# Patient Record
Sex: Male | Born: 1945 | Race: White | Hispanic: No | Marital: Married | Smoking: Never smoker
Health system: Southern US, Community
[De-identification: ages and names within clinical notes are randomized; demographics above are authoritative.]

## PROBLEM LIST (undated history)

## (undated) DIAGNOSIS — M79605 Pain in left leg: Secondary | ICD-10-CM

## (undated) DIAGNOSIS — D751 Secondary polycythemia: Secondary | ICD-10-CM

## (undated) DIAGNOSIS — C801 Malignant (primary) neoplasm, unspecified: Secondary | ICD-10-CM

## (undated) DIAGNOSIS — H9190 Unspecified hearing loss, unspecified ear: Secondary | ICD-10-CM

## (undated) DIAGNOSIS — N189 Chronic kidney disease, unspecified: Secondary | ICD-10-CM

## (undated) DIAGNOSIS — I82409 Acute embolism and thrombosis of unspecified deep veins of unspecified lower extremity: Secondary | ICD-10-CM

## (undated) DIAGNOSIS — N501 Vascular disorders of male genital organs: Secondary | ICD-10-CM

## (undated) DIAGNOSIS — M1990 Unspecified osteoarthritis, unspecified site: Secondary | ICD-10-CM

## (undated) DIAGNOSIS — M79604 Pain in right leg: Secondary | ICD-10-CM

## (undated) DIAGNOSIS — D689 Coagulation defect, unspecified: Secondary | ICD-10-CM

## (undated) DIAGNOSIS — Z5189 Encounter for other specified aftercare: Secondary | ICD-10-CM

## (undated) DIAGNOSIS — F0390 Unspecified dementia without behavioral disturbance: Secondary | ICD-10-CM

## (undated) DIAGNOSIS — E785 Hyperlipidemia, unspecified: Secondary | ICD-10-CM

## (undated) DIAGNOSIS — D090 Carcinoma in situ of bladder: Secondary | ICD-10-CM

## (undated) DIAGNOSIS — Z9889 Other specified postprocedural states: Secondary | ICD-10-CM

## (undated) DIAGNOSIS — K219 Gastro-esophageal reflux disease without esophagitis: Secondary | ICD-10-CM

## (undated) DIAGNOSIS — I739 Peripheral vascular disease, unspecified: Secondary | ICD-10-CM

## (undated) DIAGNOSIS — R7303 Prediabetes: Secondary | ICD-10-CM

## (undated) DIAGNOSIS — I10 Essential (primary) hypertension: Secondary | ICD-10-CM

## (undated) DIAGNOSIS — Z85828 Personal history of other malignant neoplasm of skin: Secondary | ICD-10-CM

## (undated) DIAGNOSIS — IMO0001 Reserved for inherently not codable concepts without codable children: Secondary | ICD-10-CM

## (undated) DIAGNOSIS — Z87820 Personal history of traumatic brain injury: Secondary | ICD-10-CM

## (undated) DIAGNOSIS — D649 Anemia, unspecified: Secondary | ICD-10-CM

## (undated) HISTORY — DX: Acute embolism and thrombosis of unspecified deep veins of unspecified lower extremity: I82.409

## (undated) HISTORY — DX: Coagulation defect, unspecified: D68.9

## (undated) HISTORY — DX: Encounter for other specified aftercare: Z51.89

## (undated) HISTORY — PX: TONSILLECTOMY: SUR1361

---

## 1990-01-02 DIAGNOSIS — Z87820 Personal history of traumatic brain injury: Secondary | ICD-10-CM

## 1990-01-02 HISTORY — DX: Personal history of traumatic brain injury: Z87.820

## 1997-09-30 ENCOUNTER — Ambulatory Visit (HOSPITAL_COMMUNITY): Admission: RE | Admit: 1997-09-30 | Discharge: 1997-09-30 | Payer: Self-pay | Admitting: Cardiology

## 2000-11-04 ENCOUNTER — Emergency Department (HOSPITAL_COMMUNITY): Admission: EM | Admit: 2000-11-04 | Discharge: 2000-11-04 | Payer: Self-pay | Admitting: Emergency Medicine

## 2000-11-04 ENCOUNTER — Encounter: Payer: Self-pay | Admitting: Emergency Medicine

## 2004-03-10 ENCOUNTER — Encounter: Payer: Self-pay | Admitting: Gastroenterology

## 2004-03-11 ENCOUNTER — Encounter: Payer: Self-pay | Admitting: Gastroenterology

## 2004-03-11 ENCOUNTER — Ambulatory Visit (HOSPITAL_COMMUNITY): Admission: RE | Admit: 2004-03-11 | Discharge: 2004-03-11 | Payer: Self-pay | Admitting: *Deleted

## 2007-01-03 HISTORY — PX: KNEE ARTHROSCOPY: SUR90

## 2007-01-03 HISTORY — PX: TOTAL KNEE ARTHROPLASTY: SHX125

## 2007-05-02 ENCOUNTER — Encounter: Admission: RE | Admit: 2007-05-02 | Discharge: 2007-05-02 | Payer: Self-pay | Admitting: Family Medicine

## 2007-07-29 ENCOUNTER — Inpatient Hospital Stay (HOSPITAL_COMMUNITY): Admission: RE | Admit: 2007-07-29 | Discharge: 2007-08-01 | Payer: Self-pay | Admitting: Orthopedic Surgery

## 2007-08-20 ENCOUNTER — Encounter: Payer: Self-pay | Admitting: Gastroenterology

## 2007-08-21 ENCOUNTER — Encounter: Payer: Self-pay | Admitting: Gastroenterology

## 2007-09-12 ENCOUNTER — Encounter: Payer: Self-pay | Admitting: Gastroenterology

## 2007-09-19 ENCOUNTER — Ambulatory Visit: Payer: Self-pay | Admitting: Gastroenterology

## 2007-09-19 DIAGNOSIS — R933 Abnormal findings on diagnostic imaging of other parts of digestive tract: Secondary | ICD-10-CM

## 2007-09-19 DIAGNOSIS — R1319 Other dysphagia: Secondary | ICD-10-CM

## 2007-09-20 ENCOUNTER — Ambulatory Visit: Payer: Self-pay | Admitting: Gastroenterology

## 2007-09-20 ENCOUNTER — Encounter: Payer: Self-pay | Admitting: Gastroenterology

## 2007-09-24 ENCOUNTER — Encounter: Payer: Self-pay | Admitting: Gastroenterology

## 2007-09-30 ENCOUNTER — Ambulatory Visit: Payer: Self-pay | Admitting: Gastroenterology

## 2007-09-30 ENCOUNTER — Ambulatory Visit (HOSPITAL_COMMUNITY): Admission: RE | Admit: 2007-09-30 | Discharge: 2007-09-30 | Payer: Self-pay | Admitting: Gastroenterology

## 2007-10-21 ENCOUNTER — Telehealth: Payer: Self-pay | Admitting: Gastroenterology

## 2007-10-24 ENCOUNTER — Ambulatory Visit: Payer: Self-pay | Admitting: Gastroenterology

## 2007-10-24 DIAGNOSIS — K219 Gastro-esophageal reflux disease without esophagitis: Secondary | ICD-10-CM

## 2009-09-20 ENCOUNTER — Emergency Department (HOSPITAL_COMMUNITY): Admission: EM | Admit: 2009-09-20 | Discharge: 2009-09-20 | Payer: Self-pay | Admitting: Emergency Medicine

## 2010-01-11 ENCOUNTER — Ambulatory Visit
Admission: RE | Admit: 2010-01-11 | Discharge: 2010-01-11 | Payer: Self-pay | Source: Home / Self Care | Attending: Urology | Admitting: Urology

## 2010-01-11 HISTORY — PX: TRANSURETHRAL RESECTION OF BLADDER TUMOR: SHX2575

## 2010-01-17 LAB — CBC
HCT: 52.7 % — ABNORMAL HIGH (ref 39.0–52.0)
Hemoglobin: 17.6 g/dL — ABNORMAL HIGH (ref 13.0–17.0)
MCH: 32.6 pg (ref 26.0–34.0)
MCHC: 33.4 g/dL (ref 30.0–36.0)
MCV: 97.6 fL (ref 78.0–100.0)
Platelets: 190 10*3/uL (ref 150–400)
RBC: 5.4 MIL/uL (ref 4.22–5.81)
RDW: 12.8 % (ref 11.5–15.5)
WBC: 6.2 10*3/uL (ref 4.0–10.5)

## 2010-01-17 LAB — BASIC METABOLIC PANEL
BUN: 11 mg/dL (ref 6–23)
CO2: 29 mEq/L (ref 19–32)
Calcium: 9.2 mg/dL (ref 8.4–10.5)
Chloride: 106 mEq/L (ref 96–112)
Creatinine, Ser: 1.09 mg/dL (ref 0.4–1.5)
GFR calc Af Amer: >60 mL/min (ref >60)
GFR calc non Af Amer: >60 mL/min (ref >60)
Glucose, Bld: 115 mg/dL — ABNORMAL HIGH (ref 70–99)
Potassium: 4.5 mEq/L (ref 3.5–5.1)
Sodium: 142 mEq/L (ref 135–145)

## 2010-01-31 ENCOUNTER — Emergency Department (HOSPITAL_COMMUNITY)
Admission: EM | Admit: 2010-01-31 | Discharge: 2010-02-01 | Payer: Self-pay | Source: Home / Self Care | Admitting: Emergency Medicine

## 2010-01-31 LAB — URINALYSIS, ROUTINE W REFLEX MICROSCOPIC
Bilirubin Urine: NEGATIVE
Ketones, ur: NEGATIVE mg/dL
Nitrite: NEGATIVE
Protein, ur: 30 mg/dL — AB
Specific Gravity, Urine: 1.016 (ref 1.005–1.030)
Urine Glucose, Fasting: 500 mg/dL — AB
Urobilinogen, UA: 0.2 mg/dL (ref 0.0–1.0)
pH: 6 (ref 5.0–8.0)

## 2010-01-31 LAB — URINE MICROSCOPIC-ADD ON

## 2010-02-01 LAB — CBC
HCT: 45.9 % (ref 39.0–52.0)
Hemoglobin: 15.7 g/dL (ref 13.0–17.0)
MCH: 32.3 pg (ref 26.0–34.0)
MCHC: 34.2 g/dL (ref 30.0–36.0)
MCV: 94.4 fL (ref 78.0–100.0)
Platelets: 239 10*3/uL (ref 150–400)
RBC: 4.86 MIL/uL (ref 4.22–5.81)
RDW: 12.9 % (ref 11.5–15.5)
WBC: 8 10*3/uL (ref 4.0–10.5)

## 2010-02-01 LAB — DIFFERENTIAL
Basophils Absolute: 0 10*3/uL (ref 0.0–0.1)
Basophils Relative: 0 % (ref 0–1)
Eosinophils Absolute: 0.2 10*3/uL (ref 0.0–0.7)
Eosinophils Relative: 3 % (ref 0–5)
Lymphocytes Relative: 26 % (ref 12–46)
Lymphs Abs: 2.1 10*3/uL (ref 0.7–4.0)
Monocytes Absolute: 1.1 10*3/uL — ABNORMAL HIGH (ref 0.1–1.0)
Monocytes Relative: 13 % — ABNORMAL HIGH (ref 3–12)
Neutro Abs: 4.6 10*3/uL (ref 1.7–7.7)
Neutrophils Relative %: 58 % (ref 43–77)

## 2010-02-02 LAB — URINE CULTURE
Colony Count: >=100000
Culture  Setup Time: 201201310322

## 2010-03-17 LAB — CBC
HCT: 49.5 % (ref 39.0–52.0)
Hemoglobin: 17.6 g/dL — ABNORMAL HIGH (ref 13.0–17.0)
MCH: 33.8 pg (ref 26.0–34.0)
MCHC: 35.4 g/dL (ref 30.0–36.0)
MCV: 95.5 fL (ref 78.0–100.0)
Platelets: 167 10*3/uL (ref 150–400)
RBC: 5.19 MIL/uL (ref 4.22–5.81)
RDW: 12.4 % (ref 11.5–15.5)
WBC: 9.3 10*3/uL (ref 4.0–10.5)

## 2010-03-17 LAB — HEMOCCULT GUIAC POC 1CARD (OFFICE): Fecal Occult Bld: POSITIVE

## 2010-05-17 NOTE — Consult Note (Signed)
NAME:  Chad Olson, Chad Olson NO.:  [id number]   MEDICAL RECORD NO.:  [id number]          PATIENT TYPE:  AMB   LOCATION:  ENDO                         FACILITY:  [facility]   PHYSICIAN:  Malcolm T. Russella Dar, MD, FACGDATE OF BIRTH:  1945-06-06   DATE OF CONSULTATION:  09/30/2007  DATE OF DISCHARGE:  09/30/2007                                 CONSULTATION   ESOPHAGEAL MAMOMETRY REPORT   INDICATIONS:  65 year old white male with dysphagia.   PROCEDURE:  Esophageal manometry was performed by standard technique.  Patient tolerated the procedure well.   RESULTS:  Upper esophageal sphincter study revealed normal pressures and  normal relaxation.   Esophageal body study revealed normal peristalsis and normal amplitudes.  100% of swallows were transmitted with normal peristalsis.   Lower esophageal sphincter study revealed normal resting pressures and  normal relaxation.   IMPRESSION:  Normal esophageal manometry.   PLAN:  Return office visit with me as scheduled.      Venita Lick. Russella Dar, MD, [facility]  Electronically Signed     MTS/MEDQ  D:  10/03/2007  T:  10/03/2007  Job:  [id number]

## 2010-05-17 NOTE — Op Note (Signed)
NAME:  Chad Olson, Chad Olson NO.:  [id number]   MEDICAL RECORD NO.:  [id number]          PATIENT TYPE:  INP   LOCATION:  4148                         FACILITY:  [facility]   PHYSICIAN:  Feliberto Gottron. Turner Daniels, M.D.   DATE OF BIRTH:  11-08-1945   DATE OF PROCEDURE:  07/29/2007  DATE OF DISCHARGE:                               OPERATIVE REPORT   PREOPERATIVE DIAGNOSIS:  End-stage arthritis of the left knee.   POSTOPERATIVE DIAGNOSIS:  End-stage arthritis of the left knee.   PROCEDURE:  Cemented left total knee arthroplasty using DePuy Sigma RP  components, 4 femur, 4 tibia, 41 patellar button, and a 10-mm Sigma RP  spacer, double batch of DePuy, and HV cement with 1500 mg of Zinacef.   SURGEON:  Feliberto Gottron. Turner Daniels, MD   FIRST ASSISTANT:  Shirl Harris, PA   ANESTHETIC:  General endotracheal.   ESTIMATED BLOOD LOSS:  Minimal.   FLUID REPLACEMENT:  1200 mL of crystalloid.   DRAINS PLACED:  Foley catheter and 2 medium Hemovac.   URINE OUTPUT:  300 mL.   ESTIMATED BLOOD LOSS:  Zero.   TOURNIQUET TIME:  One hour plus 30 minutes.   INDICATIONS FOR PROCEDURE:  A 65 year old gentleman with end-stage  arthritis of his left knee documented by bare bone arthritic changes on  von Rosen's view.  He has had a conservative treatment consisting of  anti-inflammatory medicines, exercises, physical therapy, anti-  inflammatory medicines, cortisone injections, judicious use of  narcotics, and recently had an arthroscopic washout of the knee that  only can provide about 4-5 months of pain relief.  He desires elective  left total knee arthroplasty, has a varus deformity of about 8 degrees,  and 10-degree flexion contracture.  Risks and benefits of surgery  discussed, questions answered.   DESCRIPTION OF PROCEDURE:  The patient was identified by armband and  taken to the block area at [facility] where a left femoral nerve  block was induced.  He was then taken to operating room 10.   The  appropriate anesthetic monitors were attached, and general endotracheal  anesthesia induced with the patient in the supine position.  Foley  catheter was inserted.  Preoperative IV Ancef was given in the block  area.  The tourniquet applied high to the left thigh.  Lateral post and  foot position applied to the table and left lower extremity prepped and  draped in the usual sterile fashion from the ankle to the tourniquet.  The limb was wrapped with an Esmarch bandage.  The tourniquet inflated  to 350 mmHg, and we began the procedure by making the anterior midline  incision starting a handbreadth above the patella going over the patella  1 cm medial and 2 cm distal to the tibial tubercle.  Small bleeders in  the skin and subcutaneous tissue identified and cauterized.  The  transverse retinaculum was reflected medially allowing a medial  parapatellar arthrotomy.  The prepatellar fat pad was resected.  Superficial medial collateral ligament was elevated from anterior to  posterior off the proximal tibia leaving it intact distally.  The  patella was everted.  The knee was then hyperflexed exposing the  arthritic joint surfaces.  He was down to bare bone medially and in the  patellofemoral groove as well.  Using the electrocautery, the anterior  one half of the menisci were resected.  The cruciate ligaments were  resected.  A posteromedial Z-retractor was placed and McCullough  retractor through the notch and a lateral Hohmann.  Using a half-inch  osteotome, the notch was opened up, removing osteophytes, and the tibial  spines were removed.  We then entered the proximal tibia with the DePuy  step drill followed by the intramedullary rod and a 2-degree posterior  slope cutting guide was pinned into place allowing resection of about 7  mm of bone medially and 8-9 mm of bone laterally.  The posterior  structures were protected with the Z-retractor, Archer Asa, and Hohmann  retractors and  the cut was performed with a standard oscillating saw.  We then entered the distal femur 2 mm anterior to the PCL origin  followed by an intramedullary rod and a 5-degree left distal femoral  cutting guide set at 11 mm.  This was pinned into place along the  epicondylar axis and the distal femoral cut accomplished.  We sized for  a #4 left femoral component to the cutting guide and 3 degrees of  external rotation and then placed the chamfer cutting block and the  screw pins and held it in place.  We then performed our anteroposterior  chamfer cuts without difficulty followed by the standard DePuy box cut  for the Sigma RP system.  The patella was then measured at 23 mm,  thought to be sized for 41 mm button.  We set the cutting guide at the  14 and resected the posterior 9 mm of the patella without difficulty,  sized for 41 button and drilled.  The knee was once again hyperflexed.  Retractors were placed around the tibia.  We sized for a 4 tibial  baseplate which was pinned into place followed by the smokestack and a  conical reamer followed by the Delta fin keel punch.  We then hammered  into place a 4 left trial femoral component and drilled the lugs.  A 10-  mm Sigma RP trial spacer was placed at 41 button on the patella.  The  knee was reduced, came to full extension and flexed to 130 degrees  without any impingement.  The trial components were removed.  Remnants  of the menisci posteriorly were resected at this time and then irrigated  out with normal saline solution and all the bony surfaces dried with  suction and sponges.  At the back table, a double batch of DePuy HV  cement was mixed with 1500 mg of Zinacef and then with a monomer and  applied to all bony metallic mating surfaces except for the posterior  condyles of the femur itself.  In order, we hammered into place a 4  tibial baseplate and removed excess cement, a 4 distal femoral component  and removed the excess cement.  We  snapped in the 10-mm Sigma RP spacer,  pressed the 41-mm button into place, and removed excess cement.  The  knee was held in extension with compression along the axis as the cement  cured.  The wound was irrigated out with normal saline solution and  medium Hemovac drains placed deep in the wound.  The parapatellar  arthrotomy was then closed with running #1 Vicryl suture, the  subcutaneous  tissue with 0 and 2-0 undyed Vicryl suture, and the skin  with skin staples.  A dressing of Xeroform, 4 x 4 dressing, sponges,  Webril, and Ace wrap applied.  Tourniquet let down.  The patient  awakened and taken to the recovery room without difficulty.       Feliberto Gottron. Turner Daniels, M.D.  Electronically Signed     FJR/MEDQ  D:  07/29/2007  T:  07/30/2007  Job:  [id number]

## 2010-05-20 NOTE — Discharge Summary (Signed)
NAME:  Chad Olson, Chad Olson NO.:  [id number]   MEDICAL RECORD NO.:  [id number]          PATIENT TYPE:  INP   LOCATION:  4148                         FACILITY:  [facility]   PHYSICIAN:  Feliberto Gottron. Turner Daniels, M.D.   DATE OF BIRTH:  02-13-1945   DATE OF ADMISSION:  07/29/2007  DATE OF DISCHARGE:  08/01/2007                               DISCHARGE SUMMARY   CHIEF COMPLAINT:  Left knee pain.   HISTORY OF PRESENT ILLNESS:  This is a 65 year old gentleman with  complaints of aggravating pain in his left knee despite conservative  treatment with NSAIDs, steroid injections and left knee arthroscopy.  He  has desired a surgical intervention at this time; however, the risks and  benefits of surgery were discussed with the patient.   PAST MEDICAL HISTORY:  Significant for hypertension and high  cholesterol.   PAST SURGICAL HISTORY:  He had a left knee arthroscopy in 2009.   SOCIAL HISTORY:  He is a non-smoker and does not drink alcohol.  He is  married.   FAMILY HISTORY:  Noncontributory.   ALLERGIES:  He has no known drug allergies.   CURRENT MEDICATIONS:  1. Pravastatin 40 mg one p.o. daily.  2. Lisinopril 20 mg one p.o. daily.  3. Vicodin 5/500 mg tablet one p.o. b.i.d. p.r.n. for pain.  4. Aspirin 81 mg one p.o. daily.  5. Fish oil 1000 mg p.o. daily.  6. Aleve 220 mg tablet one p.o. daily p.r.n.   PHYSICAL EXAMINATION:  Gross examination of the left knee demonstrates  the patient's range of motion to be 0-130 degrees.  He had tenderness to  palpation along the lateral joint lines and 1+ effusion.  He is  neurovascularly intact.  X-rays demonstrate bone-on-bone degenerative  joint disease in the left knee.   PREOPERATIVE LABS:  White blood cells 6.5, red blood cells 5.45,  hemoglobin 17.6, hematocrit 52.2, platelets 201, sodium 135, potassium  3.9, chloride 103, glucose 152, BUN 12, creatinine 0.95, PT 13.2, INR  1.0, and PTT 30.  His urinalysis demonstrates small  leukocytes and  calcium oxalate crystals but is otherwise within normal limits.   HOSPITAL COURSE:  Mr. Maiello was admitted to [facility] on July 29, 2007, when he underwent a total knee arthroplasty performed by Dr. Gean Birchwood using a DePuy system.  Perioperative Foley catheter was placed.  The patient tolerated this procedure well and was transferred to the  orthopedic floor.  On the first postoperative day, he complains of  nausea and vomiting.  His hemoglobin was 14.  His surgical drains were  removed and his Foley catheter was taken out.  He was able to ambulate  50 feet with physical therapy.  On the second postoperative day, the  patient reported improvement in his nausea and vomiting, and was  tolerating p.o. intake fairly well.  His surgical dressings were  changed.  His hemoglobin was 13.  He was able to ambulate 120 feet with  physical therapy.  On the third postoperative day, the patient was  eating well and ambulating independently.  He had 5 stairs  with physical  therapy and was discharged to home.   DISPOSITION:  The patient was discharged home on August 01, 2007, [facility]  managed his physical therapy, Coumadin, and wound care.  His discharge  medicines were as per the as per the HMR with the addition of Percocet 5  mg 1-2 tablets p.o. q.4 h. p.r.n. pain and Coumadin 5 mg tablet to be  taken as directed with a target INR of 1.522.  He was weightbearing as  tolerated and return to the clinic in 1 week.   FINAL DIAGNOSIS:  End-stage degenerative joint disease of the left knee.      Shirl Harris, PA      Feliberto Gottron. Turner Daniels, M.D.  Electronically Signed    JW/MEDQ  D:  08/28/2007  T:  08/29/2007  Job:  [id number]

## 2010-08-24 ENCOUNTER — Encounter: Payer: BC Managed Care – PPO | Attending: Family Medicine | Admitting: *Deleted

## 2010-08-24 ENCOUNTER — Encounter: Payer: Self-pay | Admitting: *Deleted

## 2010-08-24 ENCOUNTER — Ambulatory Visit: Payer: Self-pay

## 2010-08-24 DIAGNOSIS — E119 Type 2 diabetes mellitus without complications: Secondary | ICD-10-CM | POA: Insufficient documentation

## 2010-08-24 DIAGNOSIS — Z713 Dietary counseling and surveillance: Secondary | ICD-10-CM | POA: Insufficient documentation

## 2010-08-24 NOTE — Patient Instructions (Signed)
Patient will attend Core Diabetes Courses as scheduled or follow up prn.  

## 2010-08-24 NOTE — Progress Notes (Signed)
  Patient was seen on 08/24/2010 for the first of a series of three diabetes self-management courses at the [facility]. The following learning objectives were met by the patient during this course:   Defines diabetes and the role of insulin  Identifies type of diabetes and pathophysiology  States normal BG range and personal goals  Identifies three risk factors for the development of diabetes  States the need for and frequency of healthcare follow up (ADA Standards of Care)  No results found for this basename: HGBA1C   Most recent A1c per referring MD = 6.0% (07/26/10)  Patient has established the following initial goals:  Increase exercise  Follow DM meal plan  Take medications appropriately  Keep doctor's appointments  Lose weight  Get medical tests done regularly  Follow-Up Plan: Pt declined additional education at this time. Call for f/u appointment PRN.

## 2010-09-13 ENCOUNTER — Ambulatory Visit: Payer: BC Managed Care – PPO

## 2010-09-20 ENCOUNTER — Ambulatory Visit: Payer: BC Managed Care – PPO

## 2010-09-22 ENCOUNTER — Encounter: Payer: Self-pay | Admitting: Dietician

## 2010-09-30 LAB — CBC
HCT: 38.7 — ABNORMAL LOW
HCT: 39.7
HCT: 52.2 — ABNORMAL HIGH
Hemoglobin: 13.4
Hemoglobin: 14.4
Hemoglobin: 17.6 — ABNORMAL HIGH
MCHC: 33.7
MCV: 95.8
MCV: 96.8
Platelets: 183
Platelets: 201
RBC: 4 — ABNORMAL LOW
RBC: 4.1 — ABNORMAL LOW
RBC: 4.38
RBC: 5.45
RDW: 13.4
RDW: 13.6
WBC: 10.4
WBC: 11.4 — ABNORMAL HIGH
WBC: 11.8 — ABNORMAL HIGH
WBC: 6.5

## 2010-09-30 LAB — APTT: aPTT: 30

## 2010-09-30 LAB — PROTIME-INR
INR: 1
INR: 1.1
INR: 1.7 — ABNORMAL HIGH
Prothrombin Time: 13.2
Prothrombin Time: 14.5

## 2010-09-30 LAB — BASIC METABOLIC PANEL
BUN: 19
CO2: 26
CO2: 26
Calcium: 8.7
Calcium: 9.8
Chloride: 106
Creatinine, Ser: 0.95
Creatinine, Ser: 0.95
GFR calc Af Amer: >60
GFR calc Af Amer: >60
GFR calc non Af Amer: >60
GFR calc non Af Amer: >60
Glucose, Bld: 99
Potassium: 4.7
Sodium: 135
Sodium: 141

## 2010-09-30 LAB — DIFFERENTIAL
Basophils Absolute: 0
Basophils Relative: 1
Eosinophils Absolute: 0.1
Eosinophils Relative: 1
Lymphocytes Relative: 34
Lymphs Abs: 2.2
Monocytes Absolute: 0.7
Monocytes Relative: 11
Neutro Abs: 3.4
Neutrophils Relative %: 53

## 2010-09-30 LAB — URINE MICROSCOPIC-ADD ON

## 2010-09-30 LAB — URINALYSIS, ROUTINE W REFLEX MICROSCOPIC
Bilirubin Urine: NEGATIVE
Glucose, UA: NEGATIVE
Hgb urine dipstick: NEGATIVE
Ketones, ur: NEGATIVE
Nitrite: NEGATIVE
Protein, ur: NEGATIVE
Specific Gravity, Urine: 1.024
Urobilinogen, UA: 1
pH: 5.5

## 2010-09-30 LAB — ABO/RH: ABO/RH(D): A POS

## 2010-09-30 LAB — TYPE AND SCREEN
ABO/RH(D): A POS
Antibody Screen: NEGATIVE

## 2010-12-12 ENCOUNTER — Other Ambulatory Visit: Payer: Self-pay | Admitting: Urology

## 2010-12-15 ENCOUNTER — Other Ambulatory Visit: Payer: Self-pay | Admitting: Surgery

## 2011-01-04 ENCOUNTER — Encounter (HOSPITAL_BASED_OUTPATIENT_CLINIC_OR_DEPARTMENT_OTHER): Payer: Self-pay | Admitting: *Deleted

## 2011-01-04 NOTE — Progress Notes (Signed)
NPO AFTER MN. ARRIVES AT 0615. NEEDS ISTAT AND EKG. WILL TAKE PRAVASTATIN AM OF SURG. W/ SIP OF WATER.

## 2011-01-09 ENCOUNTER — Other Ambulatory Visit: Payer: Self-pay | Admitting: Urology

## 2011-01-09 ENCOUNTER — Encounter (HOSPITAL_BASED_OUTPATIENT_CLINIC_OR_DEPARTMENT_OTHER): Payer: Self-pay | Admitting: Anesthesiology

## 2011-01-09 ENCOUNTER — Ambulatory Visit (HOSPITAL_BASED_OUTPATIENT_CLINIC_OR_DEPARTMENT_OTHER): Payer: BC Managed Care – PPO | Admitting: Anesthesiology

## 2011-01-09 ENCOUNTER — Encounter (HOSPITAL_BASED_OUTPATIENT_CLINIC_OR_DEPARTMENT_OTHER): Payer: Self-pay | Admitting: *Deleted

## 2011-01-09 ENCOUNTER — Ambulatory Visit (HOSPITAL_BASED_OUTPATIENT_CLINIC_OR_DEPARTMENT_OTHER)
Admission: RE | Admit: 2011-01-09 | Discharge: 2011-01-09 | Disposition: A | Discharge status: Home / Self Care | Payer: BC Managed Care – PPO | Source: Ambulatory Visit | Attending: Urology | Admitting: Urology

## 2011-01-09 ENCOUNTER — Encounter (HOSPITAL_BASED_OUTPATIENT_CLINIC_OR_DEPARTMENT_OTHER)
Admission: RE | Disposition: A | Discharge status: Home / Self Care | Payer: Self-pay | Source: Ambulatory Visit | Attending: Urology

## 2011-01-09 ENCOUNTER — Other Ambulatory Visit: Payer: Self-pay

## 2011-01-09 DIAGNOSIS — E785 Hyperlipidemia, unspecified: Secondary | ICD-10-CM | POA: Insufficient documentation

## 2011-01-09 DIAGNOSIS — M129 Arthropathy, unspecified: Secondary | ICD-10-CM | POA: Insufficient documentation

## 2011-01-09 DIAGNOSIS — K219 Gastro-esophageal reflux disease without esophagitis: Secondary | ICD-10-CM | POA: Insufficient documentation

## 2011-01-09 DIAGNOSIS — Z7982 Long term (current) use of aspirin: Secondary | ICD-10-CM | POA: Insufficient documentation

## 2011-01-09 DIAGNOSIS — I10 Essential (primary) hypertension: Secondary | ICD-10-CM | POA: Insufficient documentation

## 2011-01-09 DIAGNOSIS — Z85828 Personal history of other malignant neoplasm of skin: Secondary | ICD-10-CM | POA: Insufficient documentation

## 2011-01-09 DIAGNOSIS — N323 Diverticulum of bladder: Secondary | ICD-10-CM | POA: Insufficient documentation

## 2011-01-09 DIAGNOSIS — E119 Type 2 diabetes mellitus without complications: Secondary | ICD-10-CM | POA: Insufficient documentation

## 2011-01-09 DIAGNOSIS — C679 Malignant neoplasm of bladder, unspecified: Secondary | ICD-10-CM

## 2011-01-09 DIAGNOSIS — Z79899 Other long term (current) drug therapy: Secondary | ICD-10-CM | POA: Insufficient documentation

## 2011-01-09 DIAGNOSIS — D090 Carcinoma in situ of bladder: Secondary | ICD-10-CM | POA: Insufficient documentation

## 2011-01-09 HISTORY — DX: Hyperlipidemia, unspecified: E78.5

## 2011-01-09 HISTORY — PX: CYSTOSCOPY WITH BIOPSY: SHX5122

## 2011-01-09 HISTORY — DX: Unspecified osteoarthritis, unspecified site: M19.90

## 2011-01-09 HISTORY — DX: Personal history of traumatic brain injury: Z87.820

## 2011-01-09 HISTORY — DX: Gastro-esophageal reflux disease without esophagitis: K21.9

## 2011-01-09 HISTORY — DX: Unspecified hearing loss, unspecified ear: H91.90

## 2011-01-09 HISTORY — DX: Reserved for inherently not codable concepts without codable children: IMO0001

## 2011-01-09 HISTORY — DX: Essential (primary) hypertension: I10

## 2011-01-09 LAB — POCT I-STAT 4, (NA,K, GLUC, HGB,HCT)
HCT: 52 % (ref 39.0–52.0)
Sodium: 145 mEq/L (ref 135–145)

## 2011-01-09 SURGERY — CYSTOSCOPY, WITH BIOPSY
Anesthesia: General | Site: Ureter | Wound class: Clean Contaminated

## 2011-01-09 MED ORDER — LIDOCAINE HCL (CARDIAC) 20 MG/ML IV SOLN
INTRAVENOUS | Status: DC | PRN
  Administered 2011-01-09: 100 mg via INTRAVENOUS

## 2011-01-09 MED ORDER — MIDAZOLAM HCL 5 MG/5ML IJ SOLN
INTRAMUSCULAR | Status: DC | PRN
  Administered 2011-01-09: 2 mg via INTRAVENOUS

## 2011-01-09 MED ORDER — PROPOFOL 10 MG/ML IV EMUL
INTRAVENOUS | Status: DC | PRN
  Administered 2011-01-09: 200 mg via INTRAVENOUS

## 2011-01-09 MED ORDER — DEXAMETHASONE SODIUM PHOSPHATE 4 MG/ML IJ SOLN
INTRAMUSCULAR | Status: DC | PRN
  Administered 2011-01-09: 8 mg via INTRAVENOUS

## 2011-01-09 MED ORDER — FENTANYL CITRATE 0.05 MG/ML IJ SOLN
INTRAMUSCULAR | Status: DC | PRN
  Administered 2011-01-09: 25 ug via INTRAVENOUS
  Administered 2011-01-09: 100 ug via INTRAVENOUS
  Administered 2011-01-09: 25 ug via INTRAVENOUS

## 2011-01-09 MED ORDER — CEPHALEXIN 500 MG PO CAPS: 500.0000 mg | ORAL_CAPSULE | Freq: Two times a day (BID) | ORAL | Status: AC

## 2011-01-09 MED ORDER — FENTANYL CITRATE 0.05 MG/ML IJ SOLN: 25.0000 ug | INTRAMUSCULAR | Status: DC | PRN

## 2011-01-09 MED ORDER — BELLADONNA ALKALOIDS-OPIUM 16.2-60 MG RE SUPP
RECTAL | Status: DC | PRN
  Administered 2011-01-09: 1 via RECTAL

## 2011-01-09 MED ORDER — LACTATED RINGERS IV SOLN
INTRAVENOUS | Status: DC
  Administered 2011-01-09: 07:00:00 via INTRAVENOUS

## 2011-01-09 MED ORDER — STERILE WATER FOR IRRIGATION IR SOLN
Status: DC | PRN
  Administered 2011-01-09: 3000 mL

## 2011-01-09 MED ORDER — CEFAZOLIN SODIUM-DEXTROSE 2-3 GM-% IV SOLR
2.0000 g | INTRAVENOUS | Status: AC
  Administered 2011-01-09: 2 g via INTRAVENOUS

## 2011-01-09 MED ORDER — CEFAZOLIN SODIUM 1-5 GM-% IV SOLN: 1.0000 g | INTRAVENOUS | Status: DC

## 2011-01-09 MED ORDER — HYDROCODONE-ACETAMINOPHEN 5-500 MG PO CAPS: 1.0000 | ORAL_CAPSULE | ORAL | Status: AC | PRN

## 2011-01-09 MED ORDER — ONDANSETRON HCL 4 MG/2ML IJ SOLN
INTRAMUSCULAR | Status: DC | PRN
  Administered 2011-01-09: 4 mg via INTRAVENOUS

## 2011-01-09 SURGICAL SUPPLY — 19 items
BAG DRAIN URO-CYSTO SKYTR STRL (DRAIN) ×1 IMPLANT
BAG DRN UROCATH (DRAIN)
CANISTER SUCT LVC 12 LTR MEDI- (MISCELLANEOUS) ×1 IMPLANT
CLOTH BEACON ORANGE TIMEOUT ST (SAFETY) ×2 IMPLANT
DRAPE CAMERA CLOSED 9X96 (DRAPES) ×2 IMPLANT
ELECT REM PT RETURN 9FT ADLT (ELECTROSURGICAL) ×2
ELECTRODE REM PT RTRN 9FT ADLT (ELECTROSURGICAL) ×1 IMPLANT
GLOVE BIO SURGEON STRL SZ8 (GLOVE) ×2 IMPLANT
GLOVE INDICATOR 6.5 STRL GRN (GLOVE) ×2 IMPLANT
GOWN STRL REIN XL XLG (GOWN DISPOSABLE) ×2 IMPLANT
GOWN SURGICAL LARGE (GOWNS) ×1 IMPLANT
GOWN XL W/COTTON TOWEL STD (GOWNS) ×1 IMPLANT
NDL SAFETY ECLIPSE 18X1.5 (NEEDLE) IMPLANT
NEEDLE HYPO 18GX1.5 SHARP (NEEDLE)
NEEDLE HYPO 22GX1.5 SAFETY (NEEDLE) IMPLANT
NS IRRIG 500ML POUR BTL (IV SOLUTION) IMPLANT
PACK CYSTOSCOPY (CUSTOM PROCEDURE TRAY) ×2 IMPLANT
SYR 20CC LL (SYRINGE) IMPLANT
WATER STERILE IRR 3000ML UROMA (IV SOLUTION) ×2 IMPLANT

## 2011-01-09 NOTE — Anesthesia Procedure Notes (Signed)
Procedure Name: LMA Insertion Date/Time: 01/09/2011 7:38 AM Performed by: Renella Cunas D Pre-anesthesia Checklist: Patient identified, Emergency Drugs available, Suction available and Patient being monitored Patient Re-evaluated:Patient Re-evaluated prior to inductionOxygen Delivery Method: Circle System Utilized Preoxygenation: Pre-oxygenation with 100% oxygen Intubation Type: IV induction Ventilation: Mask ventilation without difficulty LMA: LMA inserted LMA Size: 4.0 Number of attempts: 1 Placement Confirmation: positive ETCO2 Tube secured with: Tape Dental Injury: Teeth and Oropharynx as per pre-operative assessment

## 2011-01-09 NOTE — Anesthesia Postprocedure Evaluation (Signed)
  Anesthesia Post-op Note  Patient: Chad Olson  Procedure(s) Performed:  CYSTOSCOPY WITH BIOPSY  Patient Location: PACU  Anesthesia Type: General  Level of Consciousness: awake and alert   Airway and Oxygen Therapy: Patient Spontanous Breathing  Post-op Pain: mild  Post-op Assessment: Post-op Vital signs reviewed, Patient's Cardiovascular Status Stable, Respiratory Function Stable, Patent Airway and No signs of Nausea or vomiting  Post-op Vital Signs: stable  Complications: No apparent anesthesia complications
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
## 2011-01-09 NOTE — Transfer of Care (Signed)
Immediate Anesthesia Transfer of Care Note  Patient: Chad Olson  Procedure(s) Performed:  CYSTOSCOPY WITH BIOPSY  Patient Location: PACU  Anesthesia Type: General  Level of Consciousness: awake, oriented, sedated and patient cooperative  Airway & Oxygen Therapy: Patient Spontanous Breathing and Patient connected to face mask oxygen  Post-op Assessment: Report given to PACU RN and Post -op Vital signs reviewed and stable  Post vital signs: Reviewed and stable  Complications: No apparent anesthesia complications

## 2011-01-09 NOTE — Progress Notes (Signed)
Glasses returned & bilat hearing aids in ears.

## 2011-01-09 NOTE — Anesthesia Preprocedure Evaluation (Addendum)
Anesthesia Evaluation  Patient identified by MRN, date of birth, ID band Patient awake    Reviewed: Allergy & Precautions, H&P , NPO status , Patient's Chart, lab work & pertinent test results  Airway Mallampati: II TM Distance: >3 FB Neck ROM: Full    Dental No notable dental hx.    Pulmonary neg pulmonary ROS,  clear to auscultation  Pulmonary exam normal       Cardiovascular hypertension, neg cardio ROS Regular Normal    Neuro/Psych Negative Neurological ROS  Negative Psych ROS   GI/Hepatic negative GI ROS, Neg liver ROS, GERD-  ,  Endo/Other  Negative Endocrine ROSDiabetes mellitus-Morbid obesity  Renal/GU negative Renal ROS  Genitourinary negative   Musculoskeletal negative musculoskeletal ROS (+)   Abdominal   Peds negative pediatric ROS (+)  Hematology negative hematology ROS (+)   Anesthesia Other Findings   Reproductive/Obstetrics negative OB ROS                          Anesthesia Physical Anesthesia Plan  ASA: III  Anesthesia Plan: General   Post-op Pain Management:    Induction: Intravenous  Airway Management Planned: LMA  Additional Equipment:   Intra-op Plan:   Post-operative Plan:   Informed Consent: I have reviewed the patients History and Physical, chart, labs and discussed the procedure including the risks, benefits and alternatives for the proposed anesthesia with the patient or authorized representative who has indicated his/her understanding and acceptance.   Dental advisory given  Plan Discussed with: CRNA  Anesthesia Plan Comments:        Anesthesia Quick Evaluation

## 2011-01-09 NOTE — Interval H&P Note (Signed)
History and Physical Interval Note:  01/09/2011 7:25 AM  Chad Olson  has presented today for surgery, with the diagnosis of HISTORY OF BLADDER CANCER  The various methods of treatment have been discussed with the patient and family. After consideration of risks, benefits and other options for treatment, the patient has consented to  Procedure(s): CYSTOSCOPY WITH BIOPSY as a surgical intervention .  [name] history has been reviewed, patient examined, no change in status, stable for surgery.  I have reviewed the patients' chart and labs.  Questions were answered to the patient's satisfaction.     Chelsea Aus

## 2011-01-09 NOTE — Op Note (Signed)
Preoperative diagnosis:  History of urothelial carcinoma of the bladder Postoperative diagnosis: Same Procedure: Anesthetic cystoscopy, bladder biopsy Surgeon: Bertram Millard. Brylinn Teaney, M.D.  Anesthesia: Gen.  Indications: This 66 year old male comes in today for anesthetic cystoscopy, bladder biopsy.  He initially presented to Dr. 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 Cystoscopy revealed a bladder tumor.  He underwent anesthetic cystoscopy and TURBT on 01/15/2010. Pathology revealed high-grade urothelial dysplasia/carcinoma in situ. It was thought that he had one small area of microinvasion. There was no evidence of lymphovascular invasion.  He subsequently underwent induction BCG therapy, which was completed on 05/04/2010. His first maintenance BCG was completed on 07/27/2010.  He underwent cystoscopy within the office on 12/09/2010. This revealed 2 areas of abnormal urothelium, one adjacent to the diverticulum, with some erythematous changes within the diverticulum. Additionally, there was an abnormal area within the trigonal area of the bladder.At this point, he presents for cystoscopy and bladder biopsy     Technique and findings: The patient was properly identified in the holding area and received intravenous IV antibiotics. He was taken to the operating room where general anesthetic was administered with the LMA. He was placed in the dorsolithotomy position. Genitalia and perineum were prepped and draped.  Proper time out was then performed.  The procedure then commenced. A 22 French panendoscope was advanced through his urethra. Minimal narrowing of the pendulous urethra was noted. There were no lesions. Prostate was  nonobstructive. Prostatic urethra was normal. The bladder was entered and inspected circumferentially.  Both ureteral orifices were normal in configuration and location. Posterior to the right trigone, there was a 1-1/2 cm diverticulum. No specific urothelial abnormalities were noted within this. Anterior and lateral to the diverticulum, there was an erythematous, slightly nodular area. More than likely, this was consistent with past TURBT site. This was approximately 1 cm in size. There was a slight erythematous and raised area medial to the diverticulum. No other urothelial abnormalities were noted.  The cold cup forceps were used to biopsy both of the aforementioned sites. They were labeled as bladder wall and posterior bladder biopsies, respectively. 2 biopsies were taken from each of the sites, removing the abnormal appearing area. Following biopsy, these areas were cauterized with the Bugbee electrode. No active bleeding was seen. The bladder was drained, and the procedure terminated. A B. and O. suppository was placed.  The patient tolerated the procedure well. He was awakened and taken to the PACU in stable condition.

## 2011-01-10 ENCOUNTER — Encounter (HOSPITAL_BASED_OUTPATIENT_CLINIC_OR_DEPARTMENT_OTHER): Payer: Self-pay | Admitting: Urology

## 2011-07-04 ENCOUNTER — Other Ambulatory Visit: Payer: Self-pay | Admitting: Urology

## 2011-07-04 ENCOUNTER — Encounter (HOSPITAL_BASED_OUTPATIENT_CLINIC_OR_DEPARTMENT_OTHER): Payer: Self-pay | Admitting: *Deleted

## 2011-07-04 NOTE — Progress Notes (Signed)
NPO AFTER MN. ARRIVES AT 0815. NEEDS ISTAT. CURRENT EKG IN EPIC AND CHART. WILL TAKE PRAVASTATIN W/ SIP OF WATER.

## 2011-07-12 ENCOUNTER — Ambulatory Visit (HOSPITAL_BASED_OUTPATIENT_CLINIC_OR_DEPARTMENT_OTHER)
Admission: RE | Admit: 2011-07-12 | Discharge: 2011-07-12 | Disposition: A | Discharge status: Home / Self Care | Payer: BC Managed Care – PPO | Source: Ambulatory Visit | Attending: Urology | Admitting: Urology

## 2011-07-12 ENCOUNTER — Encounter (HOSPITAL_BASED_OUTPATIENT_CLINIC_OR_DEPARTMENT_OTHER): Payer: Self-pay | Admitting: *Deleted

## 2011-07-12 ENCOUNTER — Encounter (HOSPITAL_BASED_OUTPATIENT_CLINIC_OR_DEPARTMENT_OTHER)
Admission: RE | Disposition: A | Discharge status: Home / Self Care | Payer: Self-pay | Source: Ambulatory Visit | Attending: Urology

## 2011-07-12 ENCOUNTER — Ambulatory Visit (HOSPITAL_BASED_OUTPATIENT_CLINIC_OR_DEPARTMENT_OTHER): Payer: BC Managed Care – PPO | Admitting: Anesthesiology

## 2011-07-12 ENCOUNTER — Encounter (HOSPITAL_BASED_OUTPATIENT_CLINIC_OR_DEPARTMENT_OTHER): Payer: Self-pay | Admitting: Anesthesiology

## 2011-07-12 DIAGNOSIS — I10 Essential (primary) hypertension: Secondary | ICD-10-CM | POA: Insufficient documentation

## 2011-07-12 DIAGNOSIS — D090 Carcinoma in situ of bladder: Secondary | ICD-10-CM | POA: Insufficient documentation

## 2011-07-12 DIAGNOSIS — C679 Malignant neoplasm of bladder, unspecified: Secondary | ICD-10-CM

## 2011-07-12 DIAGNOSIS — Z85828 Personal history of other malignant neoplasm of skin: Secondary | ICD-10-CM | POA: Insufficient documentation

## 2011-07-12 DIAGNOSIS — Z96659 Presence of unspecified artificial knee joint: Secondary | ICD-10-CM | POA: Insufficient documentation

## 2011-07-12 DIAGNOSIS — N323 Diverticulum of bladder: Secondary | ICD-10-CM | POA: Insufficient documentation

## 2011-07-12 DIAGNOSIS — E119 Type 2 diabetes mellitus without complications: Secondary | ICD-10-CM | POA: Insufficient documentation

## 2011-07-12 DIAGNOSIS — N3020 Other chronic cystitis without hematuria: Secondary | ICD-10-CM | POA: Insufficient documentation

## 2011-07-12 DIAGNOSIS — K219 Gastro-esophageal reflux disease without esophagitis: Secondary | ICD-10-CM | POA: Insufficient documentation

## 2011-07-12 DIAGNOSIS — E785 Hyperlipidemia, unspecified: Secondary | ICD-10-CM | POA: Insufficient documentation

## 2011-07-12 HISTORY — PX: CYSTOSCOPY WITH BIOPSY: SHX5122

## 2011-07-12 HISTORY — PX: CYSTOSCOPY W/ RETROGRADES: SHX1426

## 2011-07-12 HISTORY — DX: Carcinoma in situ of bladder: D09.0

## 2011-07-12 HISTORY — DX: Pain in right leg: M79.604

## 2011-07-12 HISTORY — DX: Pain in right leg: M79.605

## 2011-07-12 LAB — POCT I-STAT 4, (NA,K, GLUC, HGB,HCT)
Glucose, Bld: 116 mg/dL — ABNORMAL HIGH (ref 70–99)
HCT: 54 % — ABNORMAL HIGH (ref 39.0–52.0)

## 2011-07-12 SURGERY — CYSTOSCOPY, WITH BIOPSY
Anesthesia: General | Site: Ureter | Wound class: Clean Contaminated

## 2011-07-12 MED ORDER — OXYCODONE HCL 5 MG PO TABS: 5.0000 mg | ORAL_TABLET | ORAL | Status: DC | PRN

## 2011-07-12 MED ORDER — CEFAZOLIN SODIUM 1-5 GM-% IV SOLN: 1.0000 g | INTRAVENOUS | Status: DC

## 2011-07-12 MED ORDER — SODIUM CHLORIDE 0.9 % IR SOLN
Status: DC | PRN
  Administered 2011-07-12: 300 mL

## 2011-07-12 MED ORDER — STERILE WATER FOR IRRIGATION IR SOLN
Status: DC | PRN
  Administered 2011-07-12: 3000 mL

## 2011-07-12 MED ORDER — FENTANYL CITRATE 0.05 MG/ML IJ SOLN
INTRAMUSCULAR | Status: DC | PRN
  Administered 2011-07-12: 50 ug via INTRAVENOUS
  Administered 2011-07-12 (×2): 25 ug via INTRAVENOUS
  Administered 2011-07-12: 50 ug via INTRAVENOUS
  Administered 2011-07-12 (×2): 25 ug via INTRAVENOUS

## 2011-07-12 MED ORDER — SODIUM CHLORIDE 0.9 % IJ SOLN: 3.0000 mL | Freq: Two times a day (BID) | INTRAMUSCULAR | Status: DC

## 2011-07-12 MED ORDER — ONDANSETRON HCL 4 MG/2ML IJ SOLN
INTRAMUSCULAR | Status: DC | PRN
  Administered 2011-07-12: 4 mg via INTRAVENOUS

## 2011-07-12 MED ORDER — ACETAMINOPHEN 325 MG PO TABS: 650.0000 mg | ORAL_TABLET | ORAL | Status: DC | PRN

## 2011-07-12 MED ORDER — HYDROCODONE-ACETAMINOPHEN 5-500 MG PO CAPS: 1.0000 | ORAL_CAPSULE | ORAL | Status: AC | PRN

## 2011-07-12 MED ORDER — PROPOFOL 10 MG/ML IV EMUL
INTRAVENOUS | Status: DC | PRN
  Administered 2011-07-12: 250 mg via INTRAVENOUS

## 2011-07-12 MED ORDER — CEFAZOLIN SODIUM-DEXTROSE 2-3 GM-% IV SOLR
2.0000 g | INTRAVENOUS | Status: AC
  Administered 2011-07-12: 2 g via INTRAVENOUS

## 2011-07-12 MED ORDER — SODIUM CHLORIDE 0.9 % IJ SOLN: 3.0000 mL | INTRAMUSCULAR | Status: DC | PRN

## 2011-07-12 MED ORDER — SODIUM CHLORIDE 0.9 % IV SOLN: 250.0000 mL | INTRAVENOUS | Status: DC | PRN

## 2011-07-12 MED ORDER — BELLADONNA ALKALOIDS-OPIUM 16.2-60 MG RE SUPP
RECTAL | Status: DC | PRN
  Administered 2011-07-12: 1 via RECTAL

## 2011-07-12 MED ORDER — CIPROFLOXACIN HCL 250 MG PO TABS: 250.0000 mg | ORAL_TABLET | Freq: Two times a day (BID) | ORAL | Status: AC

## 2011-07-12 MED ORDER — BELLADONNA-OPIUM 16.2-30 MG RE SUPP: RECTAL | Status: DC | PRN

## 2011-07-12 MED ORDER — LACTATED RINGERS IV SOLN
INTRAVENOUS | Status: DC
  Administered 2011-07-12: 100 mL/h via INTRAVENOUS
  Administered 2011-07-12 (×2): via INTRAVENOUS

## 2011-07-12 MED ORDER — ONDANSETRON HCL 4 MG/2ML IJ SOLN: 4.0000 mg | Freq: Four times a day (QID) | INTRAMUSCULAR | Status: DC | PRN

## 2011-07-12 MED ORDER — FENTANYL CITRATE 0.05 MG/ML IJ SOLN: 25.0000 ug | INTRAMUSCULAR | Status: DC | PRN

## 2011-07-12 MED ORDER — LIDOCAINE HCL (CARDIAC) 20 MG/ML IV SOLN
INTRAVENOUS | Status: DC | PRN
  Administered 2011-07-12: 80 mg via INTRAVENOUS

## 2011-07-12 MED ORDER — MORPHINE SULFATE 2 MG/ML IJ SOLN: 1.0000 mg | INTRAMUSCULAR | Status: DC | PRN

## 2011-07-12 MED ORDER — IOHEXOL 350 MG/ML SOLN
INTRAVENOUS | Status: DC | PRN
  Administered 2011-07-12: 30 mL

## 2011-07-12 MED ORDER — ACETAMINOPHEN 650 MG RE SUPP: 650.0000 mg | RECTAL | Status: DC | PRN

## 2011-07-12 SURGICAL SUPPLY — 29 items
ADAPTER CATH URET PLST 4-6FR (CATHETERS) IMPLANT
ADPR CATH URET STRL DISP 4-6FR (CATHETERS)
BAG DRAIN URO-CYSTO SKYTR STRL (DRAIN) ×3 IMPLANT
BAG DRN UROCATH (DRAIN) ×2
CANISTER SUCT LVC 12 LTR MEDI- (MISCELLANEOUS) IMPLANT
CATH INTERMIT  6FR 70CM (CATHETERS) ×1 IMPLANT
CATH URET 5FR 28IN CONE TIP (BALLOONS)
CATH URET 5FR 28IN OPEN ENDED (CATHETERS) IMPLANT
CATH URET 5FR 70CM CONE TIP (BALLOONS) IMPLANT
CLOTH BEACON ORANGE TIMEOUT ST (SAFETY) ×3 IMPLANT
CONT SPEC 4OZ CLIKSEAL STRL BL (MISCELLANEOUS) ×2 IMPLANT
DRAPE CAMERA CLOSED 9X96 (DRAPES) ×3 IMPLANT
ELECT REM PT RETURN 9FT ADLT (ELECTROSURGICAL) ×3
ELECTRODE REM PT RTRN 9FT ADLT (ELECTROSURGICAL) ×2 IMPLANT
GLOVE BIO SURGEON STRL SZ8 (GLOVE) ×3 IMPLANT
GOWN PREVENTION PLUS LG XLONG (DISPOSABLE) ×3 IMPLANT
GOWN STRL REIN XL XLG (GOWN DISPOSABLE) ×3 IMPLANT
GUIDEWIRE 0.038 PTFE COATED (WIRE) IMPLANT
GUIDEWIRE ANG ZIPWIRE 038X150 (WIRE) IMPLANT
GUIDEWIRE STR DUAL SENSOR (WIRE) ×1 IMPLANT
NDL SAFETY ECLIPSE 18X1.5 (NEEDLE) IMPLANT
NEEDLE HYPO 18GX1.5 SHARP (NEEDLE)
NEEDLE HYPO 22GX1.5 SAFETY (NEEDLE) ×1 IMPLANT
NS IRRIG 500ML POUR BTL (IV SOLUTION) ×1 IMPLANT
PACK CYSTOSCOPY (CUSTOM PROCEDURE TRAY) ×3 IMPLANT
SYR 20CC LL (SYRINGE) IMPLANT
SYRINGE 10CC LL (SYRINGE) ×1 IMPLANT
SYRINGE IRR TOOMEY STRL 70CC (SYRINGE) ×1 IMPLANT
WATER STERILE IRR 3000ML UROMA (IV SOLUTION) ×4 IMPLANT

## 2011-07-12 NOTE — Anesthesia Procedure Notes (Signed)
Procedure Name: LMA Insertion Date/Time: 07/12/2011 9:30 AM Performed by: Fran Lowes Pre-anesthesia Checklist: Patient identified, Emergency Drugs available, Suction available and Patient being monitored Patient Re-evaluated:Patient Re-evaluated prior to inductionOxygen Delivery Method: Circle System Utilized Preoxygenation: Pre-oxygenation with 100% oxygen Intubation Type: IV induction Ventilation: Mask ventilation without difficulty LMA: LMA inserted LMA Size: 4.0 Number of attempts: 1 Airway Equipment and Method: bite block Placement Confirmation: positive ETCO2 Tube secured with: Tape Dental Injury: Teeth and Oropharynx as per pre-operative assessment  Comments: LMA inserted by Dr. Shireen Quan.
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
## 2011-07-12 NOTE — Op Note (Signed)
Preoperative diagnosis: History of urothelial carcinoma the bladder with positive cytologies Postoperative diagnosis: Same  Procedure: Cystoscopy, bladder biopsies, bilateral retrograde ureteropyelograms, bilateral renal washings   Surgeon: Bertram Millard. [name], M.D.  Anesthesia: Gen.  Indications:He initially presented to Dr. Aldean Ast in November 2011 with a history of gross hematuria. 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 Cystoscopy revealed a bladder tumor.  He underwent anesthetic cystoscopy and TURBT on 01/15/2010. Pathology revealed high-grade urothelial dysplasia/carcinoma in situ. It was thought that he had one small area of microinvasion. There was no evidence of lymphovascular invasion.  He subsequently underwent induction BCG therapy, which was completed on 05/04/2010. His first maintenance BCG was completed on 07/27/2010.  Cystoscopy in December, 2012 revealed inflammation around his diverticulum, as well as an area of inflammation on the trigone. He underwent cystoscopy and repeat biopsy on 01/09/2011. The area of the trigone was consistent with inflammation, area around the diverticulum revealed carcinoma in situ.  He completed a second BCG induction course on 03/22/2011. Followup cystoscopy in June, 2013 revealed 2 erythematous around previous treatment sites, and cytology/FISH revealed atypia/positive findings, respectively. He presents now for anesthetic cystoscopy, bladder biopsy, bilateral retrogrades/renal washings.      Technique and findings: The patient was properly identified in the holding area and received preoperative IV antibiotics. He was taken to the operating room where general anesthetic was administered using the LMA. He was placed in the     dorsolithotomy position. Genitalia and  perineum were prepped and draped. Proper timeout was then performed.  The procedure then commenced. A 22 French panendoscope was advanced through the urethra under direct vision. There was a small pendulous urethral stricture which was easily dilated and passed with the beak of the scope. Prostatic urethra was nonobstructive and without lesions. The bladder was entered and inspected circumferentially. Both ureteral orifices were normal in configuration and location. There were 2 areas of erythema, associated with prior biopsy/treatment sites. One of these was in the posterior right trigone, the other on the right sidewall. There were no raised areas noted. The wide mouth bladder diverticulum was noted just to the right of the superior trigonal region. This was entered. Her was an area of leukoplakia appearing urothelium on the lateral aspect of this. There were no papillary lesions noted. Biopsies were taken of this area x2 and sent as "bladder diverticulum biopsies". For separate biopsies were taken, 2 each from the erythematous areas, one on the right sidewall, 1 in the superior right trigonal region. These were status "bladder biopsies". Inspection of the bladder revealed no other lesions. The Bugbee electrode was then used to cauterize the biopsy sites, with hemostasis achieved.  Bilateral retrograde ureteropyelograms were then performed using a 6 [place] open-ended catheter.  The retrograde pyelograms revealed normal caliber ureters throughout, with no filling defects and no evident hydronephrosis or stricture. The renal pelves and calyceal systems were normal bilaterally. The ureteral catheter was advanced into each renal pelvis separately, and washings were taken and sent as "left renal washings" and "right renal washings" separately. These were sent for cytology. As no abnormalities were seen within the ureteral or renal systems, the procedure was terminated following decompression of the bladder and  removal of the cystoscope. A B. and O. suppository had been placed.  The patient tolerated the procedure well. He was awakened  and taken to the PACU in stable condition.

## 2011-07-12 NOTE — Anesthesia Postprocedure Evaluation (Signed)
  Anesthesia Post-op Note  Patient: Chad Olson  Procedure(s) Performed: Procedure(s) (LRB): CYSTOSCOPY WITH BIOPSY (N/A) CYSTOSCOPY WITH RETROGRADE PYELOGRAM (Bilateral)  Patient Location: PACU  Anesthesia Type: General  Level of Consciousness: oriented and sedated  Airway and Oxygen Therapy: Patient Spontanous Breathing  Post-op Pain: mild  Post-op Assessment: Post-op Vital signs reviewed, Patient's Cardiovascular Status Stable, Respiratory Function Stable and Patent Airway  Post-op Vital Signs: stable  Complications: No apparent anesthesia complications

## 2011-07-12 NOTE — Anesthesia Preprocedure Evaluation (Addendum)
Anesthesia Evaluation  Patient identified by MRN, date of birth, ID band Patient awake  General Assessment Comment:HOH  Reviewed: Allergy & Precautions, H&P , NPO status , Patient's Chart, lab work & pertinent test results, reviewed documented beta blocker date and time   Airway Mallampati: III TM Distance: >3 FB Neck ROM: Full    Dental  (+) Teeth Intact and Dental Advisory Given   Pulmonary neg pulmonary ROS,  breath sounds clear to auscultation        Cardiovascular hypertension, Pt. on medications negative cardio ROS  Rhythm:Regular Rate:Normal  Denies cardiac symptoms   Neuro/Psych negative neurological ROS  negative psych ROS   GI/Hepatic negative GI ROS, Neg liver ROS,   Endo/Other  Type 2, Oral Hypoglycemic Agents  Renal/GU negative Renal ROS   Bladder lesion  negative genitourinary   Musculoskeletal negative musculoskeletal ROS (+)   Abdominal   Peds negative pediatric ROS (+)  Hematology negative hematology ROS (+)   Anesthesia Other Findings   Reproductive/Obstetrics negative OB ROS                          Anesthesia Physical Anesthesia Plan  ASA: II  Anesthesia Plan: General   Post-op Pain Management:    Induction: Intravenous  Airway Management Planned: LMA  Additional Equipment:   Intra-op Plan:   Post-operative Plan: Extubation in OR  Informed Consent:   Dental advisory given  Plan Discussed with: CRNA and Surgeon  Anesthesia Plan Comments:         Anesthesia Quick Evaluation

## 2011-07-12 NOTE — Transfer of Care (Signed)
Immediate Anesthesia Transfer of Care Note  Patient: Chad Olson  Procedure(s) Performed: Procedure(s) (LRB): CYSTOSCOPY WITH BIOPSY (N/A) CYSTOSCOPY WITH RETROGRADE PYELOGRAM (Bilateral)  Patient Location: Patient transported to PACU with oxygen via face mask at 4 Liters / Min  Anesthesia Type: General  Level of Consciousness: awake and alert   Airway & Oxygen Therapy: Patient Spontanous Breathing and Patient connected to face mask oxygen  Post-op Assessment: Report given to PACU RN and Post -op Vital signs reviewed and stable  Post vital signs: Reviewed and stable  Dentition: Teeth and oropharynx remain in pre-op condition  Complications: No apparent anesthesia complications

## 2011-07-13 ENCOUNTER — Encounter (HOSPITAL_BASED_OUTPATIENT_CLINIC_OR_DEPARTMENT_OTHER): Payer: Self-pay | Admitting: Urology

## 2011-07-21 ENCOUNTER — Telehealth: Payer: Self-pay | Admitting: Oncology

## 2011-07-21 ENCOUNTER — Other Ambulatory Visit: Payer: Self-pay | Admitting: Oncology

## 2011-07-21 DIAGNOSIS — C679 Malignant neoplasm of bladder, unspecified: Secondary | ICD-10-CM

## 2011-07-21 NOTE — Telephone Encounter (Signed)
s/w wife and she is aware of the new pt appt  aom

## 2011-07-24 ENCOUNTER — Telehealth: Payer: Self-pay | Admitting: Oncology

## 2011-07-24 NOTE — Telephone Encounter (Signed)
Referred by Dr. Retta Diones Dx- Bladder Ca. NP package mailed out.

## 2011-07-27 ENCOUNTER — Ambulatory Visit (HOSPITAL_BASED_OUTPATIENT_CLINIC_OR_DEPARTMENT_OTHER): Payer: BC Managed Care – PPO | Admitting: Oncology

## 2011-07-27 ENCOUNTER — Ambulatory Visit (HOSPITAL_BASED_OUTPATIENT_CLINIC_OR_DEPARTMENT_OTHER): Payer: BC Managed Care – PPO

## 2011-07-27 ENCOUNTER — Other Ambulatory Visit (HOSPITAL_BASED_OUTPATIENT_CLINIC_OR_DEPARTMENT_OTHER): Payer: BC Managed Care – PPO | Admitting: Lab

## 2011-07-27 VITALS — BP 120/85 | HR 78 | Temp 97.0°F | Ht 73.0 in | Wt 266.7 lb

## 2011-07-27 DIAGNOSIS — C679 Malignant neoplasm of bladder, unspecified: Secondary | ICD-10-CM

## 2011-07-27 LAB — CBC WITH DIFFERENTIAL/PLATELET
BASO%: 0.4 % (ref 0.0–2.0)
Basophils Absolute: 0 10*3/uL (ref 0.0–0.1)
Eosinophils Absolute: 0.1 10*3/uL (ref 0.0–0.5)
HCT: 52.8 % — ABNORMAL HIGH (ref 38.4–49.9)
HGB: 18 g/dL — ABNORMAL HIGH (ref 13.0–17.1)
LYMPH%: 28.8 % (ref 14.0–49.0)
MCHC: 34 g/dL (ref 32.0–36.0)
MONO#: 0.6 10*3/uL (ref 0.1–0.9)
NEUT%: 57.7 % (ref 39.0–75.0)
Platelets: 188 10*3/uL (ref 140–400)
WBC: 5.8 10*3/uL (ref 4.0–10.3)
lymph#: 1.7 10*3/uL (ref 0.9–3.3)

## 2011-07-27 LAB — COMPREHENSIVE METABOLIC PANEL
BUN: 15 mg/dL (ref 6–23)
CO2: 26 mEq/L (ref 19–32)
Calcium: 9.6 mg/dL (ref 8.4–10.5)
Chloride: 106 mEq/L (ref 96–112)
Creatinine, Ser: 0.97 mg/dL (ref 0.50–1.35)
Glucose, Bld: 110 mg/dL — ABNORMAL HIGH (ref 70–99)
Total Bilirubin: 0.7 mg/dL (ref 0.3–1.2)

## 2011-07-27 NOTE — Progress Notes (Signed)
CC:   Bertram Millard. Dahlstedt, M.D. Tammy R. Collins Scotland, M.D.  PRINCIPAL DIAGNOSIS AND REASON FOR CONSULTATION:  Bladder cancer.  HISTORY OF PRESENT ILLNESS:  [name] is a pleasant 66 year old gentleman with a history of obesity and hypertension.  He is currently retired and lives in [place], where he lived the majority of his life.  His history of bladder tumor dates back to 2011, when he presented with hematuria and his evaluations including CT scan of the abdomen and pelvis revealed a 2.5-cm bladder diverticulum and cystoscopy revealed a superficial bladder tumor.  The patient had a cystoscopic TURBT in January 2012.  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  No evidence of any invasion.  The patient was treated with induction BCG, completed in May 2012 and his maintenance was completed in July 2012.  Repeat cystoscopy in December 2012 revealed inflammation and no evidence of any disease.  Repeat cystoscopy consistent with inflammation back in January 2013.  He completed a 2nd induction course of BCG in March 2013 and most recently underwent cystoscopy and a biopsy on July 12, 2011 and showed continued persistent urothelial carcinoma in situ with background chronic cystitis and muscularis propria was present for evaluation and did not have any tumor.  That was case [id number].  At that time, Dr. Retta Diones, whom the patient is under his care at this time recommended intravesicular mitomycin.  The patient wanted another opinion regarding future options. Upon interviewing Mr. Dimmer today, he is asymptomatic from a bladder standpoint.  He does not report any hematuria.  Does not report any dysuria.  Does not report any major change in his performance status. He does have some lower extremity pain with exertion.  He does also report snoring and possible apneic episodes.  REVIEW OF SYSTEMS:  Otherwise he does not report any headaches, blurry vision, double vision.   Does not report any motor or sensory neuropathy, alteration in mental status, psychiatric issues, depression.  Does not report any fever, chills, sweats.  Does not report any cough, hemoptysis, hematemesis.  No nausea, vomiting, abdominal pain.  No hematochezia, melena, or genitourinary complaints.  Rest of review of systems is unremarkable.  PAST MEDICAL HISTORY:  Significant for hypertension, history of hyperlipidemia, some glucose intolerance, although he is not taking any medication for diabetes at this time, status post knee replacement.  MEDICATIONS:  He is on Pravachol, lisinopril, fish oil, Aleve, and aspirin.  ALLERGIES:  None.  FAMILY HISTORY:  His mother had died of complications of COPD.  Father in his 65s, died of old age.  No history of any malignancies.  No history of any prostate cancer or any bladder cancer in his family.  SOCIAL HISTORY:  He is married.  He has 2 children.  He denies any alcohol or tobacco abuse.  PHYSICAL EXAMINATION:  General:  Alert, awake gentleman, appeared in no active distress.  Vital Signs:  Blood pressure is 120/85, pulse 78, respirations 20, temperature is 97.  Weighs 266 pounds.  HEENT:  Head is normocephalic, atraumatic.  Pupils equal, round, reactive to light. Oral mucosa moist and pink.  Neck:  Supple without lymphadenopathy. Heart:  Regular rate and rhythm, S1 and S2.  Lungs:  Clear to auscultation.  Abdomen:  Soft, nontender.  No hepatosplenomegaly. Extremities:  No clubbing, cyanosis, or edema.  Neurological:  Intact motor, sensory, and deep tendon reflexes.  ASSESSMENT AND PLAN:  This is a pleasant 65 year old gentleman with the following issues: 1. Superficial bladder tumor that was initially  diagnosed in 2011 and     status post TURBT in January 2012 and had BCG fusion and     maintenance and most recently had what appears to be persistent     disease by his recent cystoscopy on July 12, 2011, that continued     to show a  superficial carcinoma in situ without any muscle     invasion.  I had a discussion today with Mr. Marsala discussing the     natural course of superficial bladder tumor and treatment options.     I do agree with Dr. Lenoria Chime recommendation at this time to     proceed with mitomycin-C intravesicular and ultimately if this     maneuver fails, a cystectomy would be the     next treatment option.  At this time, I do not see any indication     for systemic chemotherapy or radiation therapy unless he develops     muscle-invasive disease, that would be a separate issue. 2. Erythrocytosis.  His hemoglobin is up to 18.  He had normal white     cells and normal platelets.  I wonder if this is maybe related to     early signs of obstructive sleep apnea and low hypoxic state at     night causing erythrocytosis.  Again, I doubt this is a sign of a     polycythemia vera or a myeloproliferative disorder.  He does not     appear to be symptomatic from it.  He is already on aspirin.  No     further intervention from that standpoint.  FOLLOWUP:  I will be happy to see Mr. Palmeri any time in the future as needed.     ______________________________ Benjiman Core, M.D. FNS/MEDQ  D:  07/27/2011  T:  07/27/2011  Job:  [id number]

## 2011-07-27 NOTE — Progress Notes (Signed)
Note dictated

## 2011-08-21 ENCOUNTER — Encounter: Payer: Self-pay | Admitting: *Deleted

## 2011-08-21 ENCOUNTER — Other Ambulatory Visit: Payer: Self-pay | Admitting: Oncology

## 2011-08-21 DIAGNOSIS — C679 Malignant neoplasm of bladder, unspecified: Secondary | ICD-10-CM

## 2011-08-22 ENCOUNTER — Telehealth: Payer: Self-pay | Admitting: *Deleted

## 2011-08-22 NOTE — Telephone Encounter (Signed)
Spoke with wife, gave patient appointments for 08/21, 08/27, 08/28.  All questions were answered, will also mail a copy to patient's home.  Wife verbalized understanding.

## 2011-08-23 ENCOUNTER — Other Ambulatory Visit: Payer: BC Managed Care – PPO

## 2011-08-24 ENCOUNTER — Encounter: Payer: Self-pay | Admitting: Pharmacist

## 2011-08-29 ENCOUNTER — Other Ambulatory Visit: Payer: Self-pay | Admitting: Oncology

## 2011-08-29 ENCOUNTER — Other Ambulatory Visit (HOSPITAL_BASED_OUTPATIENT_CLINIC_OR_DEPARTMENT_OTHER): Payer: BC Managed Care – PPO | Admitting: Lab

## 2011-08-29 ENCOUNTER — Encounter: Payer: Self-pay | Admitting: Oncology

## 2011-08-29 ENCOUNTER — Telehealth: Payer: Self-pay | Admitting: Oncology

## 2011-08-29 ENCOUNTER — Ambulatory Visit (HOSPITAL_BASED_OUTPATIENT_CLINIC_OR_DEPARTMENT_OTHER): Payer: BC Managed Care – PPO | Admitting: Oncology

## 2011-08-29 ENCOUNTER — Ambulatory Visit: Payer: BC Managed Care – PPO

## 2011-08-29 VITALS — BP 142/90 | HR 73 | Temp 96.9°F | Resp 18 | Ht 73.0 in | Wt 271.6 lb

## 2011-08-29 DIAGNOSIS — D751 Secondary polycythemia: Secondary | ICD-10-CM

## 2011-08-29 DIAGNOSIS — C679 Malignant neoplasm of bladder, unspecified: Secondary | ICD-10-CM

## 2011-08-29 LAB — CBC WITH DIFFERENTIAL/PLATELET
Basophils Absolute: 0 10*3/uL (ref 0.0–0.1)
EOS%: 1.7 % (ref 0.0–7.0)
Eosinophils Absolute: 0.1 10*3/uL (ref 0.0–0.5)
HCT: 49.9 % (ref 38.4–49.9)
HGB: 17.4 g/dL — ABNORMAL HIGH (ref 13.0–17.1)
MCH: 32.5 pg (ref 27.2–33.4)
NEUT#: 2.9 10*3/uL (ref 1.5–6.5)
NEUT%: 54 % (ref 39.0–75.0)
RDW: 13 % (ref 11.0–14.6)
lymph#: 1.7 10*3/uL (ref 0.9–3.3)

## 2011-08-29 LAB — COMPREHENSIVE METABOLIC PANEL (CC13)
ALT: 23 U/L (ref 0–55)
Alkaline Phosphatase: 68 U/L (ref 40–150)
Potassium: 4.1 mEq/L (ref 3.5–5.1)
Sodium: 142 mEq/L (ref 136–145)
Total Bilirubin: 0.6 mg/dL (ref 0.20–1.20)
Total Protein: 6.5 g/dL (ref 6.4–8.3)

## 2011-08-29 LAB — URINALYSIS, MICROSCOPIC - CHCC
Nitrite: NEGATIVE
Protein: NEGATIVE mg/dL
pH: 6 (ref 4.6–8.0)

## 2011-08-29 MED ORDER — ONDANSETRON HCL 8 MG PO TABS: 8.0000 mg | ORAL_TABLET | Freq: Three times a day (TID) | ORAL | Status: AC | PRN

## 2011-08-29 MED ORDER — PROCHLORPERAZINE MALEATE 10 MG PO TABS: 10.0000 mg | ORAL_TABLET | Freq: Four times a day (QID) | ORAL | Status: DC | PRN

## 2011-08-29 NOTE — Patient Instructions (Addendum)
Take Anti Nausea Meds as Directed: 1. Zofran (ondansetron) 8 mg every 8 hrs as needed. Start 1st night of chemotherapy if needed. This is more preventative for chemotherapy induced nausea and also non sedating.  2. For unrelieved nausea; Add Compazine (prochloraperzine) 10 mg every 6 hrs as needed. This can be sedating.   For Nausea and or Vomiting unrelieved by these nausea meds, call us.

## 2011-08-29 NOTE — Progress Notes (Signed)
Hematology and Oncology Follow Up Visit  Chad Olson [id number] 07/21/1945 66 y.o. 08/29/2011 1:55 PM [name], MDNo ref. 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  Prior Therapy: The patient had a cystoscopic TURBT in January 2012. 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 No evidence of any invasion. The patient was treated with induction BCG, completed in May 2012 and his maintenance was completed in July 2012. Repeat cystoscopy in December 2012 revealed inflammation and no evidence of any disease. Repeat cystoscopy consistent with inflammation back in January 2013. He completed a 2nd induction course of BCG in March 2013 and most recently underwent cystoscopy and a biopsy on July 12, 2011 and showed continued persistent urothelial carcinoma in situ with background chronic cystitis and muscularis propria was present for evaluation and did not have any tumor. That was case [phone].  Current therapy: He is here to begin weekly intra vesicular Mitomycin-C given weekly.  Interim History:  Chad Olson is a 66 year old gentleman seen today for routine follow-up prior to beginning Mitomycin-C. He has been to a chemotherapy education class already. He has been doing well. He is a little nervous about starting his Mitomycin tomorrow. Denies hematuria and dysuria. The patient's wife is worried that he has a UTI as he has these frequently and has requested a U/A today. Denies chest pain, shortness of breath, dyspnea. No abdominal pain, nausea, or vomiting.   Medications: I have reviewed the patient's current medications. Current outpatient prescriptions:aspirin 81 MG tablet, Take 160 mg by mouth daily. , Disp: , Rfl: ;  fish oil-omega-3 fatty acids 1000 MG capsule, Take 1 g by mouth daily. , Disp: , Rfl: ;  lisinopril (PRINIVIL,ZESTRIL) 30 MG tablet, Take 30 mg by mouth daily., Disp: , Rfl: ;   Multiple Vitamins-Minerals (MULTIVITAMIN WITH MINERALS) tablet, Take 1 tablet by mouth daily. , Disp: , Rfl:  ondansetron (ZOFRAN) 8 MG tablet, Take 1 tablet (8 mg total) by mouth every 8 (eight) hours as needed for nausea., Disp: 20 tablet, Rfl: 2;  pravastatin (PRAVACHOL) 20 MG tablet, Take 20 mg by mouth daily., Disp: , Rfl: ;  prochlorperazine (COMPAZINE) 10 MG tablet, Take 1 tablet (10 mg total) by mouth every 6 (six) hours as needed., Disp: 30 tablet, Rfl: 1  Allergies: No Known Allergies  Past Medical History, Surgical history, Social history, and Family History were reviewed and updated.  Review of Systems: Constitutional:  Negative for fever, chills, night sweats, anorexia, weight loss, pain. Cardiovascular: no chest pain or dyspnea on exertion Respiratory: no cough, shortness of breath, or wheezing Neurological: no TIA or stroke symptoms Dermatological: negative ENT: negative Skin: Negative. Gastrointestinal: no abdominal pain, change in bowel habits, or black or bloody stools Genito-Urinary: no dysuria, trouble voiding, or hematuria Hematological and Lymphatic: negative Breast: negative for breast lumps Musculoskeletal: negative Remaining ROS negative.  Physical Exam: Blood pressure 142/90, pulse 73, temperature 96.9 F (36.1 C), temperature source Oral, resp. rate 18, height 6\' 1"  (1.854 m), weight 271 lb 9.6 oz (123.197 kg). ECOG: 1 General appearance: alert, cooperative and no distress Head: Normocephalic, without obvious abnormality, atraumatic Neck: no adenopathy, no carotid bruit, no JVD, supple, symmetrical, trachea midline and thyroid not enlarged, symmetric, no tenderness/mass/nodules Lymph nodes: Cervical, supraclavicular, and axillary nodes normal. Heart:regular rate and rhythm, S1, S2 normal, no murmur, click, rub or gallop Lung:chest clear, no wheezing, rales, normal symmetric air entry, no tachypnea, retractions or cyanosis Abdomen: soft,  non-tender, without  masses or organomegaly EXT:no erythema, induration, or nodules   Lab Results: Lab Results  Component Value Date   WBC 5.3 08/29/2011   HGB 17.4* 08/29/2011   HCT 49.9 08/29/2011   MCV 93.1 08/29/2011   PLT 189 08/29/2011     Chemistry      Component Value Date/Time   NA 142 08/29/2011 1032   NA 141 07/27/2011 0956   K 4.1 08/29/2011 1032   K 4.3 07/27/2011 0956   CL 110* 08/29/2011 1032   CL 106 07/27/2011 0956   CO2 22 08/29/2011 1032   CO2 26 07/27/2011 0956   BUN 21.0 08/29/2011 1032   BUN 15 07/27/2011 0956   CREATININE 0.9 08/29/2011 1032   CREATININE 0.97 07/27/2011 0956      Component Value Date/Time   CALCIUM 9.0 08/29/2011 1032   CALCIUM 9.6 07/27/2011 0956   ALKPHOS 68 08/29/2011 1032   ALKPHOS 63 07/27/2011 0956   AST 17 08/29/2011 1032   AST 23 07/27/2011 0956   ALT 23 08/29/2011 1032   ALT 26 07/27/2011 0956   BILITOT 0.60 08/29/2011 1032   BILITOT 0.7 07/27/2011 0956     Impression and Plan: This is a 66 year old gentleman with the following issues:  1. Superficial bladder tumor. Plan is to begin Intra vesicular Mitomycin-C tomorrow. He has attended a chemotherapy education class already. i have reviewed potential side effects including, but not limited to, bladder irritation, diarrhea, myelosuppression, and nausea/vomting. I have prescribed Zofran and Compazine for the patient to have at home. The patient is in agreement to proceeding with his chemotherapy. I have ordered a U/A per the patient/wife request.  2. Erythrocytosis. Hemoglobin is down to 17.4. Could be related to early signs of obstructive sleep apnea and low hypoxic state at night causing erythrocytosis. He is on aspirin already.   3. Follow-up. Weekly for Mitomycin-C and visit in 2 weeks.  Spent more than half the time coordinating care.    [place], [place] 8/27/20131:55 PM

## 2011-08-29 NOTE — Telephone Encounter (Signed)
appts made and printed for pt aom °

## 2011-08-30 ENCOUNTER — Other Ambulatory Visit: Payer: BC Managed Care – PPO | Admitting: Lab

## 2011-08-30 ENCOUNTER — Ambulatory Visit: Payer: BC Managed Care – PPO | Admitting: Oncology

## 2011-08-30 ENCOUNTER — Ambulatory Visit (HOSPITAL_BASED_OUTPATIENT_CLINIC_OR_DEPARTMENT_OTHER): Payer: BC Managed Care – PPO

## 2011-08-30 VITALS — BP 126/80 | HR 63 | Temp 97.0°F | Resp 20

## 2011-08-30 DIAGNOSIS — C679 Malignant neoplasm of bladder, unspecified: Secondary | ICD-10-CM

## 2011-08-30 DIAGNOSIS — Z5111 Encounter for antineoplastic chemotherapy: Secondary | ICD-10-CM

## 2011-08-30 MED ORDER — MITOMYCIN CHEMO FOR BLADDER INSTILLATION 20 MG
20.0000 mg | Freq: Once | INTRAVENOUS | Status: AC
  Administered 2011-08-30: 20 mg via INTRAVESICAL
  Filled 2011-08-30: qty 40

## 2011-08-30 MED ORDER — SODIUM CHLORIDE 0.9 % IV SOLN
Freq: Once | INTRAVENOUS | Status: AC
  Administered 2011-08-30: 09:00:00 via INTRAVENOUS

## 2011-08-30 MED ORDER — ONDANSETRON 8 MG/50ML IVPB (CHCC)
8.0000 mg | Freq: Once | INTRAVENOUS | Status: AC
  Administered 2011-08-30: 8 mg via INTRAVENOUS

## 2011-08-30 MED ORDER — DEXAMETHASONE SODIUM PHOSPHATE 10 MG/ML IJ SOLN
10.0000 mg | Freq: Once | INTRAMUSCULAR | Status: AC
  Administered 2011-08-30: 10 mg via INTRAVENOUS

## 2011-08-30 MED ORDER — LIDOCAINE HCL 2 % EX GEL
Freq: Once | CUTANEOUS | Status: AC
  Administered 2011-08-30: 1 via URETHRAL
  Filled 2011-08-30: qty 20

## 2011-08-30 NOTE — Progress Notes (Signed)
1015- 16 French foley catheter inserted by Jerolyn Center, NT without difficulty.  350 ml clear, pale yellow urine drained.  Mutamycin instilled slowly into bladder by Chrystie Nose, RN.  Pt instructed to turn slowly to each side, back, and stomach, every 15 minutes for 2 hours, starting in prone position.  Pt instructed to call RN if any pain, spasm, or other discomfort develops.

## 2011-08-30 NOTE — Progress Notes (Signed)
1230- Pt's bladder drained without difficulty, 750 ml, clear yellow urine with some of it slightly blood tinged.  Pt tolerated procedure well w/o difficulty.  Foley d/c'ed per protocol.

## 2011-08-30 NOTE — Patient Instructions (Addendum)
[facility] Discharge Instructions for Patients Receiving Chemotherapy  Today you received the following chemotherapy agents: Mutamycin  To help prevent nausea and vomiting after your treatment, we encourage you to take your nausea medication as directed by your MD.  If you develop nausea and vomiting that is not controlled by your nausea medication, call the clinic. If it is after clinic hours your family physician or the after hours number for the clinic or go to the Emergency Department.   BELOW ARE SYMPTOMS THAT SHOULD BE REPORTED IMMEDIATELY:  *FEVER GREATER THAN 100.5 F  *CHILLS WITH OR WITHOUT FEVER  NAUSEA AND VOMITING THAT IS NOT CONTROLLED WITH YOUR NAUSEA MEDICATION  *UNUSUAL SHORTNESS OF BREATH  *UNUSUAL BRUISING OR BLEEDING  TENDERNESS IN MOUTH AND THROAT WITH OR WITHOUT PRESENCE OF ULCERS  *URINARY PROBLEMS  *BOWEL PROBLEMS  UNUSUAL RASH Items with * indicate a potential emergency and should be followed up as soon as possible.  One of the nurses will contact you 24 hours after your treatment. Please let the nurse know about any problems that you may have experienced. Feel free to call the clinic you have any questions or concerns. The clinic phone number is [phone].    * Make sure when you get home that you sit down to use the bathroom (to avoid splashing) and flush the toilet twice with the lid down.  Do this for 48 hours.  * Make sure to drink plenty of liquids.

## 2011-08-31 ENCOUNTER — Telehealth: Payer: Self-pay | Admitting: *Deleted

## 2011-08-31 NOTE — Telephone Encounter (Signed)
Spoke with patient's wife Darlene.  She reports today he feels tired and a little nauseated.  Has taken anti-emetic.  At this time no emesis.  I asked about his urine output.  Agustin Cree says he has not complained or told her about any back pain or blood in urine.  Asked that she call if any changes in his status.  She denies questions at this time.

## 2011-08-31 NOTE — Telephone Encounter (Signed)
Spoke with wife darlene, informed her that from the U/A culture, there is no UTI. Wife verbalizes understanding.

## 2011-08-31 NOTE — Telephone Encounter (Signed)
Message copied by Augusto Garbe on Thu Aug 31, 2011 12:28 PM ------      Message from: Faith Rogue F      Created: Wed Aug 30, 2011  1:23 PM      Regarding: chemo f/u call       1st time intravesicular Mutamycin      Dr. Clelia Croft      Please call home number listed (pt's wife's cell), as pt is hard of hearing on the phone

## 2011-09-06 ENCOUNTER — Other Ambulatory Visit: Payer: Self-pay | Admitting: Oncology

## 2011-09-06 ENCOUNTER — Ambulatory Visit (HOSPITAL_BASED_OUTPATIENT_CLINIC_OR_DEPARTMENT_OTHER): Payer: BC Managed Care – PPO

## 2011-09-06 ENCOUNTER — Other Ambulatory Visit: Payer: BC Managed Care – PPO | Admitting: Lab

## 2011-09-06 ENCOUNTER — Other Ambulatory Visit (HOSPITAL_BASED_OUTPATIENT_CLINIC_OR_DEPARTMENT_OTHER): Payer: BC Managed Care – PPO | Admitting: Lab

## 2011-09-06 VITALS — BP 117/81 | HR 63 | Temp 97.0°F | Resp 20

## 2011-09-06 DIAGNOSIS — C679 Malignant neoplasm of bladder, unspecified: Secondary | ICD-10-CM

## 2011-09-06 DIAGNOSIS — Z5111 Encounter for antineoplastic chemotherapy: Secondary | ICD-10-CM

## 2011-09-06 LAB — CBC WITH DIFFERENTIAL/PLATELET
Basophils Absolute: 0 10*3/uL (ref 0.0–0.1)
EOS%: 2.4 % (ref 0.0–7.0)
HCT: 53.4 % — ABNORMAL HIGH (ref 38.4–49.9)
HGB: 18.4 g/dL — ABNORMAL HIGH (ref 13.0–17.1)
MCH: 32.2 pg (ref 27.2–33.4)
MCV: 93.5 fL (ref 79.3–98.0)
MONO%: 10.4 % (ref 0.0–14.0)
NEUT%: 56.1 % (ref 39.0–75.0)
lymph#: 1.9 10*3/uL (ref 0.9–3.3)

## 2011-09-06 MED ORDER — SODIUM CHLORIDE 0.9 % IV SOLN
Freq: Once | INTRAVENOUS | Status: AC
  Administered 2011-09-06: 50 mL via INTRAVENOUS

## 2011-09-06 MED ORDER — DEXAMETHASONE SODIUM PHOSPHATE 10 MG/ML IJ SOLN
10.0000 mg | Freq: Once | INTRAMUSCULAR | Status: AC
  Administered 2011-09-06: 10 mg via INTRAVENOUS

## 2011-09-06 MED ORDER — LIDOCAINE HCL 2 % EX GEL
Freq: Once | CUTANEOUS | Status: AC
  Administered 2011-09-06: 20 via URETHRAL
  Filled 2011-09-06: qty 20

## 2011-09-06 MED ORDER — MITOMYCIN CHEMO FOR BLADDER INSTILLATION 20 MG
20.0000 mg | Freq: Once | INTRAVENOUS | Status: AC
  Administered 2011-09-06: 20 mg via INTRAVESICAL
  Filled 2011-09-06: qty 40

## 2011-09-06 MED ORDER — ONDANSETRON 8 MG/50ML IVPB (CHCC)
8.0000 mg | Freq: Once | INTRAVENOUS | Status: AC
Start: 2011-09-06 — End: 2011-09-06
  Administered 2011-09-06: 8 mg via INTRAVENOUS

## 2011-09-06 NOTE — Progress Notes (Signed)
Pt. Arrived for 2nd Intravesicular chemotherapy today.  Denied n/v since last tmt except for this morning.  Support given and IV antiemetic administered pre chemo.  #16 French foley inserted with 300 ml of  clear yellow urine.  Pt tolerated procedure well.  Chemo instilled at 09:15hrs and pt. Began complaints of mild- mod. discomfort (fullness) at around 11:30hr.  Foley bag reattached and appr. 700 ml of slightly greyish urine.  Pt. Tolerated procedure well.  Chemo instill total of 1 hr and .   Peripheral IV removed. HL

## 2011-09-06 NOTE — Patient Instructions (Addendum)
[facility] Discharge Instructions for Patients Receiving Chemotherapy  Today you received the following chemotherapy agents Mitomycin To help prevent nausea and vomiting after your treatment, we encourage you to take your nausea medication as per Dr. Clelia Croft.   If you develop nausea and vomiting that is not controlled by your nausea medication, call the clinic. If it is after clinic hours your family physician or the after hours number for the clinic or go to the Emergency Department.   BELOW ARE SYMPTOMS THAT SHOULD BE REPORTED IMMEDIATELY:  *FEVER GREATER THAN 100.5 F  *CHILLS WITH OR WITHOUT FEVER  NAUSEA AND VOMITING THAT IS NOT CONTROLLED WITH YOUR NAUSEA MEDICATION  *UNUSUAL SHORTNESS OF BREATH  *UNUSUAL BRUISING OR BLEEDING  TENDERNESS IN MOUTH AND THROAT WITH OR WITHOUT PRESENCE OF ULCERS  *URINARY PROBLEMS  *BOWEL PROBLEMS  UNUSUAL RASH Items with * indicate a potential emergency and should be followed up as soon as possible.  . Feel free to call the clinic you have any questions or concerns. The clinic phone number is [phone].   I have been informed and understand all the instructions given to me. I know to contact the clinic, my physician, or go to the Emergency Department if any problems should occur. I do not have any questions at this time, but understand that I may call the clinic during office hours   should I have any questions or need assistance in obtaining follow up care.    __________________________________________  _____________  __________ Signature of Patient or Authorized Representative            Date                   Time    __________________________________________ Nurse's Signature

## 2011-09-13 ENCOUNTER — Ambulatory Visit (HOSPITAL_BASED_OUTPATIENT_CLINIC_OR_DEPARTMENT_OTHER): Payer: BC Managed Care – PPO

## 2011-09-13 ENCOUNTER — Encounter: Payer: Self-pay | Admitting: Oncology

## 2011-09-13 ENCOUNTER — Other Ambulatory Visit (HOSPITAL_BASED_OUTPATIENT_CLINIC_OR_DEPARTMENT_OTHER): Payer: BC Managed Care – PPO | Admitting: Lab

## 2011-09-13 ENCOUNTER — Telehealth: Payer: Self-pay | Admitting: Oncology

## 2011-09-13 ENCOUNTER — Ambulatory Visit (HOSPITAL_BASED_OUTPATIENT_CLINIC_OR_DEPARTMENT_OTHER): Payer: BC Managed Care – PPO | Admitting: Oncology

## 2011-09-13 VITALS — BP 146/88 | HR 62 | Temp 97.4°F | Resp 18 | Ht 73.0 in | Wt 269.4 lb

## 2011-09-13 VITALS — BP 129/82 | HR 62 | Temp 99.1°F | Resp 17

## 2011-09-13 DIAGNOSIS — C679 Malignant neoplasm of bladder, unspecified: Secondary | ICD-10-CM

## 2011-09-13 DIAGNOSIS — D751 Secondary polycythemia: Secondary | ICD-10-CM

## 2011-09-13 DIAGNOSIS — Z5111 Encounter for antineoplastic chemotherapy: Secondary | ICD-10-CM

## 2011-09-13 LAB — CBC WITH DIFFERENTIAL/PLATELET
Eosinophils Absolute: 0.1 10*3/uL (ref 0.0–0.5)
LYMPH%: 24.5 % (ref 14.0–49.0)
MCV: 94.9 fL (ref 79.3–98.0)
MONO%: 8.4 % (ref 0.0–14.0)
NEUT#: 5.3 10*3/uL (ref 1.5–6.5)
NEUT%: 65.2 % (ref 39.0–75.0)
Platelets: 189 10*3/uL (ref 140–400)
RBC: 5.53 10*6/uL (ref 4.20–5.82)
nRBC: 0 % (ref 0–0)

## 2011-09-13 LAB — COMPREHENSIVE METABOLIC PANEL (CC13)
BUN: 19 mg/dL (ref 7.0–26.0)
CO2: 20 mEq/L — ABNORMAL LOW (ref 22–29)
Creatinine: 0.9 mg/dL (ref 0.7–1.3)
Glucose: 106 mg/dl — ABNORMAL HIGH (ref 70–99)
Total Bilirubin: 0.9 mg/dL (ref 0.20–1.20)

## 2011-09-13 MED ORDER — LIDOCAINE HCL 2 % EX GEL
Freq: Once | CUTANEOUS | Status: AC
  Administered 2011-09-13: 20 via URETHRAL
  Filled 2011-09-13: qty 20

## 2011-09-13 MED ORDER — ONDANSETRON 8 MG/50ML IVPB (CHCC)
8.0000 mg | Freq: Once | INTRAVENOUS | Status: AC
  Administered 2011-09-13: 8 mg via INTRAVENOUS

## 2011-09-13 MED ORDER — OXYBUTYNIN CHLORIDE 5 MG PO TABS
5.0000 mg | ORAL_TABLET | Freq: Once | ORAL | Status: AC
  Administered 2011-09-13: 5 mg via ORAL
  Filled 2011-09-13: qty 1

## 2011-09-13 MED ORDER — DEXAMETHASONE SODIUM PHOSPHATE 10 MG/ML IJ SOLN
10.0000 mg | Freq: Once | INTRAMUSCULAR | Status: AC
  Administered 2011-09-13: 10 mg via INTRAVENOUS

## 2011-09-13 MED ORDER — SODIUM CHLORIDE 0.9 % IV SOLN
Freq: Once | INTRAVENOUS | Status: AC
  Administered 2011-09-13: 11:00:00 via INTRAVENOUS

## 2011-09-13 MED ORDER — MITOMYCIN CHEMO FOR BLADDER INSTILLATION 20 MG
20.0000 mg | Freq: Once | INTRAVENOUS | Status: AC
  Administered 2011-09-13: 20 mg via INTRAVESICAL
  Filled 2011-09-13: qty 40

## 2011-09-13 NOTE — Progress Notes (Signed)
1520 Patient arrived for Intravesicular chemotherapy treatment, PIV started and antiemetics administered per order.  #16 french foley cath inserted, patient tolerated well,  with of clear yellow urine output pre procedure. Chemo instilled @ approx 1255 patient with no complaints. Chemo allowed to dwell for 2 hours. F/C reattached and another of light greenish blue urine ouput. PIV removed and patient discharged with wife and sister ambulating with no complaints.

## 2011-09-13 NOTE — Telephone Encounter (Signed)
gv pt appt schedule for September/October. Per 9/11 pof continue wkly lb/chemo thru 10/2 and schedule lb/FS 10/10.

## 2011-09-13 NOTE — Patient Instructions (Addendum)
[facility] Discharge Instructions for Patients Receiving Chemotherapy  Today you received the following chemotherapy agent Mitomycin.  To help prevent nausea and vomiting after your treatment, we encourage you to take your nausea medication. Begin taking your nausea medication as often as prescribed for by Dr.Shadad.    If you develop nausea and vomiting that is not controlled by your nausea medication, call the clinic. If it is after clinic hours your family physician or the after hours number for the clinic or go to the Emergency Department.   BELOW ARE SYMPTOMS THAT SHOULD BE REPORTED IMMEDIATELY:  *FEVER GREATER THAN 100.5 F  *CHILLS WITH OR WITHOUT FEVER  NAUSEA AND VOMITING THAT IS NOT CONTROLLED WITH YOUR NAUSEA MEDICATION  *UNUSUAL SHORTNESS OF BREATH  *UNUSUAL BRUISING OR BLEEDING  TENDERNESS IN MOUTH AND THROAT WITH OR WITHOUT PRESENCE OF ULCERS  *URINARY PROBLEMS  *BOWEL PROBLEMS  UNUSUAL RASH Items with * indicate a potential emergency and should be followed up as soon as possible.  One of the nurses will contact you 24 hours after your treatment. Please let the nurse know about any problems that you may have experienced. Feel free to call the clinic you have any questions or concerns. The clinic phone number is [phone].   I have been informed and understand all the instructions given to me. I know to contact the clinic, my physician, or go to the Emergency Department if any problems should occur. I do not have any questions at this time, but understand that I may call the clinic during office hours   should I have any questions or need assistance in obtaining follow up care.    __________________________________________  _____________  __________ Signature of Patient or Authorized Representative            Date                   Time    __________________________________________ Nurse's Signature       MITOMYCIN (mye toe MYE sin)  is a chemotherapy drug. This medicine is used to treat cancer of the stomach and pancreas. This medicine may be used for other purposes; ask your health care provider or pharmacist if you have questions. What should I tell my health care provider before I take this medicine? They need to know if you have any of these conditions: -anemia -bleeding disorder -infection (especially a virus infection such as chickenpox, cold sores, or herpes) -kidney disease -low blood counts like low platelets, red blood cells, white blood cells -recent radiation therapy -an unusual or allergic reaction to mitomycin, other chemotherapy agents, other medicines, foods, dyes, or preservatives -pregnant or trying to get pregnant -breast-feeding How should I use this medicine? This drug is given as an injection or infusion into a vein. It is administered in a hospital or clinic by a specially trained health care professional. Talk to your pediatrician regarding the use of this medicine in children. Special care may be needed. Overdosage: If you think you have taken too much of this medicine contact a poison control center or emergency room at once. NOTE: This medicine is only for you. Do not share this medicine with others. What if I miss a dose? It is important not to miss your dose. Call your doctor or health care professional if you are unable to keep an appointment. What may interact with this medicine? -medicines to increase blood counts like filgrastim, pegfilgrastim, sargramostim -vaccines This list may not describe all  possible interactions. Give your health care provider a list of all the medicines, herbs, non-prescription drugs, or dietary supplements you use. Also tell them if you smoke, drink alcohol, or use illegal drugs. Some items may interact with your medicine. What should I watch for while using this medicine? Your condition will be monitored carefully while you are receiving this medicine. You will  need important blood work done while you are taking this medicine. This drug may make you feel generally unwell. This is not uncommon, as chemotherapy can affect healthy cells as well as cancer cells. Report any side effects. Continue your course of treatment even though you feel ill unless your doctor tells you to stop. Call your doctor or health care professional for advice if you get a fever, chills or sore throat, or other symptoms of a cold or flu. Do not treat yourself. This drug decreases your body's ability to fight infections. Try to avoid being around people who are sick. This medicine may increase your risk to bruise or bleed. Call your doctor or health care professional if you notice any unusual bleeding. Be careful brushing and flossing your teeth or using a toothpick because you may get an infection or bleed more easily. If you have any dental work done, tell your dentist you are receiving this medicine. Avoid taking products that contain aspirin, acetaminophen, ibuprofen, naproxen, or ketoprofen unless instructed by your doctor. These medicines may hide a fever. Do not become pregnant while taking this medicine. Women should inform their doctor if they wish to become pregnant or think they might be pregnant. There is a potential for serious side effects to an unborn child. Talk to your health care professional or pharmacist for more information. Do not breast-feed an infant while taking this medicine. What side effects may I notice from receiving this medicine? Side effects that you should report to your doctor or health care professional as soon as possible: -allergic reactions like skin rash, itching or hives, swelling of the face, lips, or tongue -low blood counts - this medicine may decrease the number of white blood cells, red blood cells and platelets. You may be at increased risk for infections and bleeding. -signs of infection - fever or chills, cough, sore throat, pain or  difficulty passing urine -signs of decreased platelets or bleeding - bruising, pinpoint red spots on the skin, black, tarry stools, blood in the urine -signs of decreased red blood cells - unusually weak or tired, fainting spells, lightheadedness -breathing problems -changes in vision -chest pain -confusion -dry cough -high blood pressure -mouth sores -pain, swelling, redness at site where injected -pain, tingling, numbness in the hands or feet -seizures -swelling of the ankles, feet, hands -trouble passing urine or change in the amount of urine Side effects that usually do not require medical attention (report to your doctor or health care professional if they continue or are bothersome): -diarrhea -green to blue color of urine -hair loss -loss of appetite -nausea, vomiting This list may not describe all possible side effects. Call your doctor for medical advice about side effects. You may report side effects to FDA at 1-800-FDA-1088. Where should I keep my medicine? This drug is given in a hospital or clinic and will not be stored at home. NOTE: This sheet is a summary. It may not cover all possible information. If you have questions about this medicine, talk to your doctor, pharmacist, or health care provider.  2012, [employer]. (06/27/2007 11:16:23 AM)

## 2011-09-13 NOTE — Progress Notes (Signed)
Hematology and Oncology Follow Up Visit  Chad Olson [id number] Sep 04, 1945 66 y.o. 09/13/2011 3:07 PM [name], MDNo ref. 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  Prior Therapy: The patient had a cystoscopic TURBT in January 2012. 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 No evidence of any invasion. The patient was treated with induction BCG, completed in May 2012 and his maintenance was completed in July 2012. Repeat cystoscopy in December 2012 revealed inflammation and no evidence of any disease. Repeat cystoscopy consistent with inflammation back in January 2013. He completed a 2nd induction course of BCG in March 2013 and most recently underwent cystoscopy and a biopsy on July 12, 2011 and showed continued persistent urothelial carcinoma in situ with background chronic cystitis and muscularis propria was present for evaluation and did not have any tumor. That was case [id number].  Current therapy: Intra vesicular Mitomycin-C given weekly. Today is week 3 of 6.  Interim History:  Mr Flett is a 66 year old gentleman seen today for routine follow-up. He is here for week 3 of intra vesicular  Mitomycin-C. He has been doing well on his chemotherapy. Denies hematuria and dysuria. He has bladder spasms with his chemotherapy and take Ditropan on the day of chemotherapy to help with this. Denies chest pain, shortness of breath, dyspnea. No abdominal pain. He has had mild nausea, but no vomiting.   Medications: I have reviewed the patient's current medications. Current outpatient prescriptions:aspirin 81 MG tablet, Take 160 mg by mouth daily. , Disp: , Rfl: ;  fish oil-omega-3 fatty acids 1000 MG capsule, Take 1 g by mouth daily. , Disp: , Rfl: ;  lisinopril (PRINIVIL,ZESTRIL) 30 MG tablet, Take 30 mg by mouth daily., Disp: , Rfl: ;  Multiple Vitamins-Minerals (MULTIVITAMIN WITH MINERALS) tablet,  Take 1 tablet by mouth daily. , Disp: , Rfl:  ondansetron (ZOFRAN) 8 MG tablet, Take 8 mg by mouth Every 8 hours as needed., Disp: , Rfl: ;  pravastatin (PRAVACHOL) 20 MG tablet, Take 20 mg by mouth daily., Disp: , Rfl: ;  prochlorperazine (COMPAZINE) 10 MG tablet, Take 1 tablet (10 mg total) by mouth every 6 (six) hours as needed., Disp: 30 tablet, Rfl: 1 No current facility-administered medications for this visit. Facility-Administered Medications Ordered in Other Visits: 0.9 %  sodium chloride infusion, , Intravenous, Once, Benjiman Core, MD, Last Rate: 20 mL/hr at 09/13/11 1115;  dexamethasone (DECADRON) injection 10 mg, 10 mg, Intravenous, Once, Benjiman Core, MD, 10 mg at 09/13/11 1123;  lidocaine (XYLOCAINE) 2 % jelly, , Urethral, Once, Benjiman Core, MD, 20 application at 09/13/11 1150 mitoMYcin (MUTAMYCIN) chemo injection 20 mg, 20 mg, Bladder Instillation, Once, Benjiman Core, MD, 20 mg at 09/13/11 1253;  ondansetron (ZOFRAN) IVPB 8 mg, 8 mg, Intravenous, Once, Benjiman Core, MD, 8 mg at 09/13/11 1123;  oxybutynin (DITROPAN) tablet 5 mg, 5 mg, Oral, Once, Benjiman Core, MD, 5 mg at 09/13/11 1303  Allergies: No Known Allergies  Past Medical History, Surgical history, Social history, and Family History were reviewed and updated.  Review of Systems: Constitutional:  Negative for fever, chills, night sweats, anorexia, weight loss, pain. Cardiovascular: no chest pain or dyspnea on exertion Respiratory: no cough, shortness of breath, or wheezing Neurological: no TIA or stroke symptoms Dermatological: negative ENT: negative Skin: Negative. Gastrointestinal: no abdominal pain, change in bowel habits, or black or bloody stools Genito-Urinary: no dysuria, trouble voiding, or hematuria Hematological  and Lymphatic: negative Breast: negative for breast lumps Musculoskeletal: negative Remaining ROS negative.  Physical Exam: Blood pressure 146/88, pulse 62, temperature 97.4 F (36.3 C),  temperature source Oral, resp. rate 18, height 6\' 1"  (1.854 m), weight 269 lb 6.4 oz (122.199 kg). ECOG: 1 General appearance: alert, cooperative and no distress Head: Normocephalic, without obvious abnormality, atraumatic Neck: no adenopathy, no carotid bruit, no JVD, supple, symmetrical, trachea midline and thyroid not enlarged, symmetric, no tenderness/mass/nodules Lymph nodes: Cervical, supraclavicular, and axillary nodes normal. Heart:regular rate and rhythm, S1, S2 normal, no murmur, click, rub or gallop Lung:chest clear, no wheezing, rales, normal symmetric air entry, no tachypnea, retractions or cyanosis Abdomen: soft, non-tender, without masses or organomegaly EXT:no erythema, induration, or nodules   Lab Results: Lab Results  Component Value Date   WBC 8.1 09/13/2011   HGB 17.9* 09/13/2011   HCT 52.5* 09/13/2011   MCV 94.9 09/13/2011   PLT 189 09/13/2011     Chemistry      Component Value Date/Time   NA 141 09/13/2011 0852   NA 141 07/27/2011 0956   K 4.3 09/13/2011 0852   K 4.3 07/27/2011 0956   CL 109* 09/13/2011 0852   CL 106 07/27/2011 0956   CO2 20* 09/13/2011 0852   CO2 26 07/27/2011 0956   BUN 19.0 09/13/2011 0852   BUN 15 07/27/2011 0956   CREATININE 0.9 09/13/2011 0852   CREATININE 0.97 07/27/2011 0956      Component Value Date/Time   CALCIUM 9.2 09/13/2011 0852   CALCIUM 9.6 07/27/2011 0956   ALKPHOS 69 09/13/2011 0852   ALKPHOS 63 07/27/2011 0956   AST 18 09/13/2011 0852   AST 23 07/27/2011 0956   ALT 20 09/13/2011 0852   ALT 26 07/27/2011 0956   BILITOT 0.90 09/13/2011 0852   BILITOT 0.7 07/27/2011 0956     Impression and Plan: This is a 66 year old gentleman with the following issues:  1. Superficial bladder tumor. On intra vesicular Mitomycin-C and tolerating this well. Plan is to continue weekly Mitomycin-C for 6 weeks.  2. Erythrocytosis. Hemoglobin is stable. Could be related to early signs of obstructive sleep apnea and low hypoxic state at night causing  erythrocytosis. He is on aspirin already.   3. Follow-up. Weekly for Mitomycin-C and visit with Dr Clelia Croft on 10/10.  Spent more than half the time coordinating care.    Clenton Pare 9/11/20133:07 PM

## 2011-09-20 ENCOUNTER — Ambulatory Visit (HOSPITAL_BASED_OUTPATIENT_CLINIC_OR_DEPARTMENT_OTHER): Payer: BC Managed Care – PPO

## 2011-09-20 ENCOUNTER — Other Ambulatory Visit (HOSPITAL_BASED_OUTPATIENT_CLINIC_OR_DEPARTMENT_OTHER): Payer: BC Managed Care – PPO

## 2011-09-20 VITALS — BP 127/84 | HR 69 | Temp 97.1°F

## 2011-09-20 DIAGNOSIS — C679 Malignant neoplasm of bladder, unspecified: Secondary | ICD-10-CM

## 2011-09-20 DIAGNOSIS — Z5111 Encounter for antineoplastic chemotherapy: Secondary | ICD-10-CM

## 2011-09-20 LAB — CBC WITH DIFFERENTIAL/PLATELET
BASO%: 0.4 % (ref 0.0–2.0)
EOS%: 2.4 % (ref 0.0–7.0)
MCH: 32.7 pg (ref 27.2–33.4)
MCHC: 34.2 g/dL (ref 32.0–36.0)
MCV: 95.6 fL (ref 79.3–98.0)
MONO%: 10.1 % (ref 0.0–14.0)
NEUT%: 59.1 % (ref 39.0–75.0)
RDW: 13.3 % (ref 11.0–14.6)
lymph#: 2.1 10*3/uL (ref 0.9–3.3)

## 2011-09-20 MED ORDER — MITOMYCIN CHEMO FOR BLADDER INSTILLATION 20 MG
20.0000 mg | Freq: Once | INTRAVENOUS | Status: AC
  Administered 2011-09-20: 20 mg via INTRAVESICAL
  Filled 2011-09-20: qty 40

## 2011-09-20 MED ORDER — SODIUM CHLORIDE 0.9 % IV SOLN
Freq: Once | INTRAVENOUS | Status: AC
  Administered 2011-09-20: 10:00:00 via INTRAVENOUS

## 2011-09-20 MED ORDER — DEXAMETHASONE SODIUM PHOSPHATE 10 MG/ML IJ SOLN
10.0000 mg | Freq: Once | INTRAMUSCULAR | Status: AC
  Administered 2011-09-20: 10 mg via INTRAVENOUS

## 2011-09-20 MED ORDER — ONDANSETRON 8 MG/50ML IVPB (CHCC)
8.0000 mg | Freq: Once | INTRAVENOUS | Status: AC
  Administered 2011-09-20: 8 mg via INTRAVENOUS

## 2011-09-20 MED ORDER — LIDOCAINE HCL 2 % EX GEL
Freq: Once | CUTANEOUS | Status: AC
  Administered 2011-09-20: 20 via URETHRAL
  Filled 2011-09-20: qty 20

## 2011-09-20 NOTE — Patient Instructions (Addendum)
[facility] Discharge Instructions for Patients Receiving Chemotherapy  Today you received the following chemotherapy agents Mitomycin.  To help prevent nausea and vomiting after your treatment, we encourage you to take your nausea medication as ordered per MD.   If you develop nausea and vomiting that is not controlled by your nausea medication, call the clinic. If it is after clinic hours your family physician or the after hours number for the clinic or go to the Emergency Department.   BELOW ARE SYMPTOMS THAT SHOULD BE REPORTED IMMEDIATELY:  *FEVER GREATER THAN 100.5 F  *CHILLS WITH OR WITHOUT FEVER  NAUSEA AND VOMITING THAT IS NOT CONTROLLED WITH YOUR NAUSEA MEDICATION  *UNUSUAL SHORTNESS OF BREATH  *UNUSUAL BRUISING OR BLEEDING  TENDERNESS IN MOUTH AND THROAT WITH OR WITHOUT PRESENCE OF ULCERS  *URINARY PROBLEMS  *BOWEL PROBLEMS  UNUSUAL RASH Items with * indicate a potential emergency and should be followed up as soon as possible.  Please let the nurse know about any problems that you may have experienced. Feel free to call the clinic you have any questions or concerns. The clinic phone number is [phone].   I have been informed and understand all the instructions given to me. I know to contact the clinic, my physician, or go to the Emergency Department if any problems should occur. I do not have any questions at this time, but understand that I may call the clinic during office hours   should I have any questions or need assistance in obtaining follow up care.    __________________________________________  _____________  __________ Signature of Patient or Authorized Representative            Date                   Time    __________________________________________ Nurse's Signature    

## 2011-09-20 NOTE — Progress Notes (Signed)
0915-Pt arrived to infusion room for intravesicular chemotherapy today.  Zofran and Decadron IV given as ordered per MD via peripheral line.  Lidocaine jelly inserted without difficulty into urethra prior to foley insertion.  30F. Foley catheter inserted without difficulty 10 minutes after lidocaine inserted.  Pt tolerated well.  700 ml.'s of clear and yellow urine returned pre chemo.  Chemotherapy inserted at 1036 with no difficulties and no complaints from patient.  Pt. Instructed on changing position from stomach to side to back to side every 15 minutes for 2 hours if able to tolerate.  Pt able to tolerate Chemo dwell for 2 hours with no complaints.  Foley catheter reattached and additional 700 ml.'s of light blue-green urine returned.  Pt discharged to home with wife and sister via ambulation with no difficulties.

## 2011-09-27 ENCOUNTER — Ambulatory Visit (HOSPITAL_BASED_OUTPATIENT_CLINIC_OR_DEPARTMENT_OTHER): Payer: BC Managed Care – PPO

## 2011-09-27 ENCOUNTER — Other Ambulatory Visit (HOSPITAL_BASED_OUTPATIENT_CLINIC_OR_DEPARTMENT_OTHER): Payer: BC Managed Care – PPO | Admitting: Lab

## 2011-09-27 ENCOUNTER — Other Ambulatory Visit: Payer: Self-pay | Admitting: Oncology

## 2011-09-27 VITALS — BP 139/89 | HR 65 | Temp 98.3°F | Resp 18

## 2011-09-27 DIAGNOSIS — Z5111 Encounter for antineoplastic chemotherapy: Secondary | ICD-10-CM

## 2011-09-27 DIAGNOSIS — C679 Malignant neoplasm of bladder, unspecified: Secondary | ICD-10-CM

## 2011-09-27 LAB — CBC WITH DIFFERENTIAL/PLATELET
Eosinophils Absolute: 0.2 10*3/uL (ref 0.0–0.5)
LYMPH%: 29.4 % (ref 14.0–49.0)
MONO#: 0.8 10*3/uL (ref 0.1–0.9)
NEUT#: 3.9 10*3/uL (ref 1.5–6.5)
Platelets: 169 10*3/uL (ref 140–400)
RBC: 5.59 10*6/uL (ref 4.20–5.82)
RDW: 13.3 % (ref 11.0–14.6)
WBC: 6.9 10*3/uL (ref 4.0–10.3)
lymph#: 2 10*3/uL (ref 0.9–3.3)
nRBC: 0 % (ref 0–0)

## 2011-09-27 MED ORDER — SODIUM CHLORIDE 0.9 % IV SOLN
Freq: Once | INTRAVENOUS | Status: AC
  Administered 2011-09-27: 09:00:00 via INTRAVENOUS

## 2011-09-27 MED ORDER — LIDOCAINE HCL 2 % EX GEL
Freq: Once | CUTANEOUS | Status: AC
  Administered 2011-09-27: 20 via URETHRAL
  Filled 2011-09-27: qty 20

## 2011-09-27 MED ORDER — DEXAMETHASONE SODIUM PHOSPHATE 10 MG/ML IJ SOLN
10.0000 mg | Freq: Once | INTRAMUSCULAR | Status: AC
  Administered 2011-09-27: 10 mg via INTRAVENOUS

## 2011-09-27 MED ORDER — ONDANSETRON 8 MG/50ML IVPB (CHCC)
8.0000 mg | Freq: Once | INTRAVENOUS | Status: AC
  Administered 2011-09-27: 8 mg via INTRAVENOUS

## 2011-09-27 MED ORDER — OXYBUTYNIN CHLORIDE 5 MG PO TABS
5.0000 mg | ORAL_TABLET | ORAL | Status: AC
  Administered 2011-09-27: 5 mg via ORAL
  Filled 2011-09-27: qty 1

## 2011-09-27 MED ORDER — MITOMYCIN CHEMO FOR BLADDER INSTILLATION 40 MG
40.0000 mg | Freq: Once | INTRAVENOUS | Status: AC
  Administered 2011-09-27: 40 mg via INTRAVESICAL
  Filled 2011-09-27: qty 80

## 2011-09-27 MED ORDER — OXYBUTYNIN CHLORIDE 5 MG PO TABS: 5.0000 mg | ORAL_TABLET | ORAL | Status: DC

## 2011-09-27 NOTE — Progress Notes (Signed)
0930-16 french foley inserted. Patient tolerated well. Foley to gravity and noted output prior to Chemo administration.- MAW

## 2011-09-27 NOTE — Progress Notes (Signed)
Pt. Tolerated chemo x 2hrs in bladder.  Denied any discomfort.  Bladder drained total of of urine post chemo.  No hematuria noted.  Pt. Tolerated procedure well.   Post care reviewed with pt. Today.

## 2011-09-27 NOTE — Patient Instructions (Addendum)
[facility] Discharge Instructions for Patients Receiving Chemotherapy  Today you received the following chemotherapy agents Mitomycin  To help prevent nausea and vomiting after your treatment, we encourage you to take your nausea medication.  If you develop nausea and vomiting that is not controlled by your nausea medication, call the clinic. If it is after clinic hours your family physician or the after hours number for the clinic or go to the Emergency Department.   BELOW ARE SYMPTOMS THAT SHOULD BE REPORTED IMMEDIATELY:  *FEVER GREATER THAN 100.5 F  *CHILLS WITH OR WITHOUT FEVER  NAUSEA AND VOMITING THAT IS NOT CONTROLLED WITH YOUR NAUSEA MEDICATION  *UNUSUAL SHORTNESS OF BREATH  *UNUSUAL BRUISING OR BLEEDING  TENDERNESS IN MOUTH AND THROAT WITH OR WITHOUT PRESENCE OF ULCERS  *URINARY PROBLEMS  *BOWEL PROBLEMS  UNUSUAL RASH Items with * indicate a potential emergency and should be followed up as soon as possible.  One of the . Feel free to call the clinic you have any questions or concerns. The clinic phone number is [phone].  Increase oral fluids for 48 hrs.  Sit down when you have to urinate and flush toilet twice with toilet lid down.  Please continue this for 7 days.    I have been informed and understand all the instructions given to me. I know to contact the clinic, my physician, or go to the Emergency Department if any problems should occur. I do not have any questions at this time, but understand that I may call the clinic during office hours   should I have any questions or need assistance in obtaining follow up care.    __________________________________________  _____________  __________ Signature of Patient or Authorized Representative            Date                   Time    __________________________________________ Nurse's Signature

## 2011-10-04 ENCOUNTER — Ambulatory Visit (HOSPITAL_BASED_OUTPATIENT_CLINIC_OR_DEPARTMENT_OTHER): Payer: BC Managed Care – PPO

## 2011-10-04 ENCOUNTER — Other Ambulatory Visit (HOSPITAL_BASED_OUTPATIENT_CLINIC_OR_DEPARTMENT_OTHER): Payer: BC Managed Care – PPO | Admitting: Lab

## 2011-10-04 VITALS — BP 121/77 | HR 60 | Temp 97.1°F

## 2011-10-04 DIAGNOSIS — C679 Malignant neoplasm of bladder, unspecified: Secondary | ICD-10-CM

## 2011-10-04 DIAGNOSIS — Z5111 Encounter for antineoplastic chemotherapy: Secondary | ICD-10-CM

## 2011-10-04 LAB — CBC WITH DIFFERENTIAL/PLATELET
BASO%: 0.3 % (ref 0.0–2.0)
Eosinophils Absolute: 0.1 10*3/uL (ref 0.0–0.5)
MCHC: 34 g/dL (ref 32.0–36.0)
MONO#: 0.6 10*3/uL (ref 0.1–0.9)
MONO%: 9.9 % (ref 0.0–14.0)
NEUT#: 3.5 10*3/uL (ref 1.5–6.5)
RBC: 5.6 10*6/uL (ref 4.20–5.82)
RDW: 13.2 % (ref 11.0–14.6)
WBC: 6.4 10*3/uL (ref 4.0–10.3)
nRBC: 0 % (ref 0–0)

## 2011-10-04 MED ORDER — LIDOCAINE HCL 2 % EX GEL
Freq: Once | CUTANEOUS | Status: AC
  Administered 2011-10-04: 20 via URETHRAL
  Filled 2011-10-04: qty 20

## 2011-10-04 MED ORDER — SODIUM CHLORIDE 0.9 % IV SOLN
Freq: Once | INTRAVENOUS | Status: AC
  Administered 2011-10-04: 09:00:00 via INTRAVENOUS

## 2011-10-04 MED ORDER — DEXAMETHASONE SODIUM PHOSPHATE 10 MG/ML IJ SOLN
10.0000 mg | Freq: Once | INTRAMUSCULAR | Status: AC
  Administered 2011-10-04: 10 mg via INTRAVENOUS

## 2011-10-04 MED ORDER — ONDANSETRON 8 MG/50ML IVPB (CHCC)
8.0000 mg | Freq: Once | INTRAVENOUS | Status: AC
  Administered 2011-10-04: 8 mg via INTRAVENOUS

## 2011-10-04 MED ORDER — MITOMYCIN CHEMO FOR BLADDER INSTILLATION 40 MG
40.0000 mg | Freq: Once | INTRAVENOUS | Status: AC
  Administered 2011-10-04: 40 mg via INTRAVESICAL
  Filled 2011-10-04: qty 80

## 2011-10-04 NOTE — Progress Notes (Signed)
#   16 French foley inserted by Melodie Bouillon RN, clear urine obtained.  Chemo instilled by Chrystie Nose RN. Pt. Tolerated procedure well.   Pt. C/o'ed of bladder spasms feeling at 1140r.. Felt like he needed to go to the bathroom.  Foley unclamped to draned by gravity BY Faith Rogue RN. Pt. Reported feeling of relief.  Foley drained approximately of grayish colour urine.  No hematuria noted.  Pt. Will be f/u'ed by Dr. Clelia Croft and Urologist.  Discharged in good spirit.  Accompanied with wife and daughter.

## 2011-10-04 NOTE — Patient Instructions (Addendum)
[facility] Discharge Instructions for Patients Receiving Chemotherapy  Today you received the following chemotherapy agents Mitomycin.  To help prevent nausea and vomiting after your treatment, we encourage you to take your nausea medication as ordered per MD.   If you develop nausea and vomiting that is not controlled by your nausea medication, call the clinic. If it is after clinic hours your family physician or the after hours number for the clinic or go to the Emergency Department.   BELOW ARE SYMPTOMS THAT SHOULD BE REPORTED IMMEDIATELY:  *FEVER GREATER THAN 100.5 F  *CHILLS WITH OR WITHOUT FEVER  NAUSEA AND VOMITING THAT IS NOT CONTROLLED WITH YOUR NAUSEA MEDICATION  *UNUSUAL SHORTNESS OF BREATH  *UNUSUAL BRUISING OR BLEEDING  TENDERNESS IN MOUTH AND THROAT WITH OR WITHOUT PRESENCE OF ULCERS  *URINARY PROBLEMS  *BOWEL PROBLEMS  UNUSUAL RASH Items with * indicate a potential emergency and should be followed up as soon as possible.  Please let the nurse know about any problems that you may have experienced. Feel free to call the clinic you have any questions or concerns. The clinic phone number is [phone].   I have been informed and understand all the instructions given to me. I know to contact the clinic, my physician, or go to the Emergency Department if any problems should occur. I do not have any questions at this time, but understand that I may call the clinic during office hours   should I have any questions or need assistance in obtaining follow up care.    __________________________________________  _____________  __________ Signature of Patient or Authorized Representative            Date                   Time    __________________________________________ Nurse's Signature

## 2011-10-12 ENCOUNTER — Other Ambulatory Visit (HOSPITAL_BASED_OUTPATIENT_CLINIC_OR_DEPARTMENT_OTHER): Payer: BC Managed Care – PPO | Admitting: Lab

## 2011-10-12 ENCOUNTER — Telehealth: Payer: Self-pay | Admitting: Oncology

## 2011-10-12 ENCOUNTER — Ambulatory Visit (HOSPITAL_BASED_OUTPATIENT_CLINIC_OR_DEPARTMENT_OTHER): Payer: BC Managed Care – PPO | Admitting: Oncology

## 2011-10-12 VITALS — BP 148/89 | HR 81 | Temp 96.8°F | Resp 20 | Ht 73.0 in | Wt 271.2 lb

## 2011-10-12 DIAGNOSIS — C677 Malignant neoplasm of urachus: Secondary | ICD-10-CM

## 2011-10-12 DIAGNOSIS — C679 Malignant neoplasm of bladder, unspecified: Secondary | ICD-10-CM

## 2011-10-12 LAB — COMPREHENSIVE METABOLIC PANEL (CC13)
Alkaline Phosphatase: 68 U/L (ref 40–150)
Creatinine: 1 mg/dL (ref 0.7–1.3)
Glucose: 167 mg/dl — ABNORMAL HIGH (ref 70–99)
Sodium: 143 mEq/L (ref 136–145)
Total Bilirubin: 0.7 mg/dL (ref 0.20–1.20)
Total Protein: 6.5 g/dL (ref 6.4–8.3)

## 2011-10-12 LAB — CBC WITH DIFFERENTIAL/PLATELET
BASO%: 0.7 % (ref 0.0–2.0)
Eosinophils Absolute: 0.1 10*3/uL (ref 0.0–0.5)
LYMPH%: 24 % (ref 14.0–49.0)
MCHC: 33.8 g/dL (ref 32.0–36.0)
MCV: 97.3 fL (ref 79.3–98.0)
MONO%: 8.9 % (ref 0.0–14.0)
NEUT%: 64.2 % (ref 39.0–75.0)
Platelets: 184 10*3/uL (ref 140–400)
RBC: 5.57 10*6/uL (ref 4.20–5.82)

## 2011-10-12 NOTE — Telephone Encounter (Signed)
Gave pt appt for April 2014 lab and

## 2011-10-12 NOTE — Progress Notes (Signed)
Hematology and Oncology Follow Up Visit  Chad Olson [id number] [birth year] 66 y.o. 10/12/2011 10:17 AM Chad Olson, MDSpear, Tammy, MD   Principle Diagnosis: Superficial bladder cancer initially diagnosed in 2011 with persistent disease.  Prior Therapy: The patient had a cystoscopic TURBT in January 2012. 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 No evidence of any invasion. The patient was treated with induction BCG, completed in May 2012 and his maintenance was completed in July 2012. Repeat cystoscopy in December 2012 revealed inflammation and no evidence of any disease. Repeat cystoscopy consistent with inflammation back in January 2013. He completed a 2nd induction course of BCG in March 2013 and most recently underwent cystoscopy and a biopsy on July 12, 2011 and showed continued persistent urothelial carcinoma in situ with background chronic cystitis and muscularis propria was present for evaluation and did not have any tumor. That was case [id number].  Current therapy: Intravesicular Mitomycin-C given weekly. He is S/P 6 cycles completed 10/04/2011.  Interim History:  Chad Olson is a 66 year old gentleman seen today for routine follow-up. He is here for after he completed  6 weeks of intra vesicular  Mitomycin-C. He has been doing well on his chemotherapy. Denies hematuria and dysuria. He has bladder spasms with his chemotherapy and took Ditropan on the day of chemotherapy which helped with this. Denies chest pain, shortness of breath, dyspnea. No abdominal pain. He has had mild nausea, but no vomiting.   Medications: I have reviewed the patient's current medications. Current outpatient prescriptions:aspirin 81 MG tablet, Take 160 mg by mouth daily. , Disp: , Rfl: ;  fish oil-omega-3 fatty acids 1000 MG capsule, Take 1 g by mouth daily. , Disp: , Rfl: ;  lisinopril (PRINIVIL,ZESTRIL) 30 MG tablet, Take 30 mg by mouth daily., Disp: , Rfl: ;  Multiple  Vitamins-Minerals (MULTIVITAMIN WITH MINERALS) tablet, Take 1 tablet by mouth daily. , Disp: , Rfl:  ondansetron (ZOFRAN) 8 MG tablet, Take 8 mg by mouth Every 8 hours as needed., Disp: , Rfl: ;  pravastatin (PRAVACHOL) 20 MG tablet, Take 20 mg by mouth daily., Disp: , Rfl: ;  prochlorperazine (COMPAZINE) 10 MG tablet, Take 1 tablet (10 mg total) by mouth every 6 (six) hours as needed., Disp: 30 tablet, Rfl: 1  Allergies: No Known Allergies  Past Medical History, Surgical history, Social history, and Family History were reviewed and updated.  Review of Systems: Constitutional:  Negative for fever, chills, night sweats, anorexia, weight loss, pain. Cardiovascular: no chest pain or dyspnea on exertion Respiratory: no cough, shortness of breath, or wheezing Neurological: no TIA or stroke symptoms Dermatological: negative ENT: negative Skin: Negative. Gastrointestinal: no abdominal pain, change in bowel habits, or black or bloody stools Genito-Urinary: no dysuria, trouble voiding, or hematuria Hematological and Lymphatic: negative Breast: negative for breast lumps Musculoskeletal: negative Remaining ROS negative.  Physical Exam: Blood pressure 148/89, pulse 81, temperature 96.8 F (36 C), temperature source Oral, resp. rate 20, height 6\' 1"  (1.854 m), weight 271 lb 3.2 oz (123.016 kg). ECOG: 1 General appearance: alert, cooperative and no distress Head: Normocephalic, without obvious abnormality, atraumatic Neck: no adenopathy, no carotid bruit, no JVD, supple, symmetrical, trachea midline and thyroid not enlarged, symmetric, no tenderness/mass/nodules Lymph nodes: Cervical, supraclavicular, and axillary nodes normal. Heart:regular rate and rhythm, S1, S2 normal, no murmur, click, rub or gallop Lung:chest clear, no wheezing, rales, normal symmetric air entry, no tachypnea, retractions or cyanosis Abdomen: soft, non-tender, without masses or organomegaly EXT:no erythema, induration, or  nodules   Lab Results: Lab Results  Component Value Date   WBC 6.4 10/12/2011   HGB 18.3* 10/12/2011   HCT 54.2* 10/12/2011   MCV 97.3 10/12/2011   PLT 184 10/12/2011     Chemistry      Component Value Date/Time   NA 141 09/13/2011 0852   NA 141 07/27/2011 0956   K 4.3 09/13/2011 0852   K 4.3 07/27/2011 0956   CL 109* 09/13/2011 0852   CL 106 07/27/2011 0956   CO2 20* 09/13/2011 0852   CO2 26 07/27/2011 0956   BUN 19.0 09/13/2011 0852   BUN 15 07/27/2011 0956   CREATININE 0.9 09/13/2011 0852   CREATININE 0.97 07/27/2011 0956      Component Value Date/Time   CALCIUM 9.2 09/13/2011 0852   CALCIUM 9.6 07/27/2011 0956   ALKPHOS 69 09/13/2011 0852   ALKPHOS 63 07/27/2011 0956   AST 18 09/13/2011 0852   AST 23 07/27/2011 0956   ALT 20 09/13/2011 0852   ALT 26 07/27/2011 0956   BILITOT 0.90 09/13/2011 0852   BILITOT 0.7 07/27/2011 0956     Impression and Plan: This is a 66 year old gentleman with the following issues:  1. Superficial bladder tumor. S/P 6 weeks of intravesicular Mitomycin-C and tolerated this well. He is planned to have a follow up cytoscopy in 11/2011 with Dr. Retta Diones. No further intervention is needed from Medical Oncology stand point.   2. Erythrocytosis. Hemoglobin is stable. Could be related to early signs of obstructive sleep apnea and low hypoxic state at night causing erythrocytosis. He is on aspirin already.   3. Follow-up. In 6 month.   .    Aylene Acoff 10/10/201310:17 AM

## 2011-12-08 ENCOUNTER — Other Ambulatory Visit: Payer: Self-pay | Admitting: Urology

## 2011-12-18 ENCOUNTER — Encounter (HOSPITAL_BASED_OUTPATIENT_CLINIC_OR_DEPARTMENT_OTHER): Payer: Self-pay | Admitting: *Deleted

## 2011-12-18 NOTE — Progress Notes (Signed)
SPOKE W/ WIFE. NPO AFTER MN. ARRIVES AT 0830. NEEDS ISTAT. CURRENT EKG IN EPIC AND CHART. WILL TAKE PRAVASTATIN AM OF SURG W/ SIP OF WATER.

## 2011-12-21 ENCOUNTER — Encounter (HOSPITAL_BASED_OUTPATIENT_CLINIC_OR_DEPARTMENT_OTHER): Payer: Self-pay | Admitting: Anesthesiology

## 2011-12-21 ENCOUNTER — Encounter (HOSPITAL_BASED_OUTPATIENT_CLINIC_OR_DEPARTMENT_OTHER): Payer: Self-pay | Admitting: *Deleted

## 2011-12-21 ENCOUNTER — Ambulatory Visit (HOSPITAL_BASED_OUTPATIENT_CLINIC_OR_DEPARTMENT_OTHER)
Admission: RE | Admit: 2011-12-21 | Discharge: 2011-12-21 | Disposition: A | Discharge status: Home / Self Care | Payer: BC Managed Care – PPO | Source: Ambulatory Visit | Attending: Urology | Admitting: Urology

## 2011-12-21 ENCOUNTER — Ambulatory Visit (HOSPITAL_BASED_OUTPATIENT_CLINIC_OR_DEPARTMENT_OTHER): Payer: BC Managed Care – PPO | Admitting: Anesthesiology

## 2011-12-21 ENCOUNTER — Encounter (HOSPITAL_BASED_OUTPATIENT_CLINIC_OR_DEPARTMENT_OTHER)
Admission: RE | Disposition: A | Discharge status: Home / Self Care | Payer: Self-pay | Source: Ambulatory Visit | Attending: Urology

## 2011-12-21 DIAGNOSIS — I10 Essential (primary) hypertension: Secondary | ICD-10-CM | POA: Insufficient documentation

## 2011-12-21 DIAGNOSIS — C679 Malignant neoplasm of bladder, unspecified: Secondary | ICD-10-CM | POA: Insufficient documentation

## 2011-12-21 DIAGNOSIS — E785 Hyperlipidemia, unspecified: Secondary | ICD-10-CM | POA: Insufficient documentation

## 2011-12-21 DIAGNOSIS — Q619 Cystic kidney disease, unspecified: Secondary | ICD-10-CM | POA: Insufficient documentation

## 2011-12-21 DIAGNOSIS — N323 Diverticulum of bladder: Secondary | ICD-10-CM | POA: Insufficient documentation

## 2011-12-21 DIAGNOSIS — K219 Gastro-esophageal reflux disease without esophagitis: Secondary | ICD-10-CM | POA: Insufficient documentation

## 2011-12-21 HISTORY — DX: Secondary polycythemia: D75.1

## 2011-12-21 HISTORY — PX: CYSTOSCOPY WITH BIOPSY: SHX5122

## 2011-12-21 HISTORY — DX: Other specified postprocedural states: Z98.890

## 2011-12-21 HISTORY — DX: Other specified postprocedural states: Z85.828

## 2011-12-21 HISTORY — PX: CYSTOSCOPY W/ RETROGRADES: SHX1426

## 2011-12-21 LAB — POCT I-STAT 4, (NA,K, GLUC, HGB,HCT)
Glucose, Bld: 113 mg/dL — ABNORMAL HIGH (ref 70–99)
HCT: 53 % — ABNORMAL HIGH (ref 39.0–52.0)
Hemoglobin: 18 g/dL — ABNORMAL HIGH (ref 13.0–17.0)
Potassium: 3.9 mEq/L (ref 3.5–5.1)
Sodium: 142 mEq/L (ref 135–145)

## 2011-12-21 SURGERY — CYSTOSCOPY, WITH RETROGRADE PYELOGRAM
Anesthesia: General | Site: Bladder | Wound class: Clean Contaminated

## 2011-12-21 MED ORDER — STERILE WATER FOR IRRIGATION IR SOLN
Status: DC | PRN
  Administered 2011-12-21: 3000 mL

## 2011-12-21 MED ORDER — IOHEXOL 350 MG/ML SOLN
INTRAVENOUS | Status: DC | PRN
  Administered 2011-12-21: 5 mL via INTRAVENOUS

## 2011-12-21 MED ORDER — DEXTROSE 5 % IV SOLN
3.0000 g | Freq: Four times a day (QID) | INTRAVENOUS | Status: AC
  Administered 2011-12-21: 3 g via INTRAVENOUS
  Filled 2011-12-21: qty 3000

## 2011-12-21 MED ORDER — FENTANYL CITRATE 0.05 MG/ML IJ SOLN
INTRAMUSCULAR | Status: DC | PRN
  Administered 2011-12-21 (×2): 50 ug via INTRAVENOUS

## 2011-12-21 MED ORDER — PROPOFOL 10 MG/ML IV BOLUS
INTRAVENOUS | Status: DC | PRN
  Administered 2011-12-21: 300 mg via INTRAVENOUS

## 2011-12-21 MED ORDER — MIDAZOLAM HCL 5 MG/5ML IJ SOLN
INTRAMUSCULAR | Status: DC | PRN
  Administered 2011-12-21: 2 mg via INTRAVENOUS

## 2011-12-21 MED ORDER — ACETAMINOPHEN 10 MG/ML IV SOLN
INTRAVENOUS | Status: DC | PRN
  Administered 2011-12-21: 1000 mg via INTRAVENOUS

## 2011-12-21 MED ORDER — BELLADONNA ALKALOIDS-OPIUM 16.2-60 MG RE SUPP
RECTAL | Status: DC | PRN
  Administered 2011-12-21: 1 via RECTAL

## 2011-12-21 MED ORDER — CEFAZOLIN SODIUM 1-5 GM-% IV SOLN
1.0000 g | Freq: Four times a day (QID) | INTRAVENOUS | Status: DC
  Filled 2011-12-21: qty 50

## 2011-12-21 MED ORDER — ONDANSETRON HCL 4 MG/2ML IJ SOLN
INTRAMUSCULAR | Status: DC | PRN
  Administered 2011-12-21: 4 mg via INTRAVENOUS

## 2011-12-21 MED ORDER — LACTATED RINGERS IV SOLN
INTRAVENOUS | Status: DC
Start: 2011-12-21 — End: 2011-12-21
  Administered 2011-12-21: 09:00:00 via INTRAVENOUS
  Filled 2011-12-21: qty 1000

## 2011-12-21 MED ORDER — LIDOCAINE HCL (CARDIAC) 20 MG/ML IV SOLN
INTRAVENOUS | Status: DC | PRN
  Administered 2011-12-21: 80 mg via INTRAVENOUS

## 2011-12-21 MED ORDER — CIPROFLOXACIN HCL 250 MG PO TABS: 250.0000 mg | ORAL_TABLET | Freq: Two times a day (BID) | ORAL | Status: DC

## 2011-12-21 SURGICAL SUPPLY — 28 items
ADAPTER CATH URET PLST 4-6FR (CATHETERS) ×1 IMPLANT
ADPR CATH URET STRL DISP 4-6FR (CATHETERS) ×2
BAG DRAIN URO-CYSTO SKYTR STRL (DRAIN) ×3 IMPLANT
BAG DRN UROCATH (DRAIN) ×2
CANISTER SUCT LVC 12 LTR MEDI- (MISCELLANEOUS) ×1 IMPLANT
CATH INTERMIT  6FR 70CM (CATHETERS) ×1 IMPLANT
CATH URET 5FR 28IN CONE TIP (BALLOONS)
CATH URET 5FR 28IN OPEN ENDED (CATHETERS) IMPLANT
CATH URET 5FR 70CM CONE TIP (BALLOONS) IMPLANT
CLOTH BEACON ORANGE TIMEOUT ST (SAFETY) ×3 IMPLANT
DRAPE CAMERA CLOSED 9X96 (DRAPES) ×3 IMPLANT
ELECT REM PT RETURN 9FT ADLT (ELECTROSURGICAL) ×3
ELECTRODE REM PT RTRN 9FT ADLT (ELECTROSURGICAL) ×2 IMPLANT
GLOVE BIO SURGEON STRL SZ 6.5 (GLOVE) ×1 IMPLANT
GLOVE BIO SURGEON STRL SZ8 (GLOVE) ×3 IMPLANT
GLOVE ECLIPSE 6.0 STRL STRAW (GLOVE) ×1 IMPLANT
GOWN PREVENTION PLUS LG XLONG (DISPOSABLE) ×3 IMPLANT
GOWN STRL REIN XL XLG (GOWN DISPOSABLE) ×3 IMPLANT
GUIDEWIRE 0.038 PTFE COATED (WIRE) IMPLANT
GUIDEWIRE ANG ZIPWIRE 038X150 (WIRE) IMPLANT
GUIDEWIRE STR DUAL SENSOR (WIRE) IMPLANT
NDL SAFETY ECLIPSE 18X1.5 (NEEDLE) IMPLANT
NEEDLE HYPO 18GX1.5 SHARP (NEEDLE)
NEEDLE HYPO 22GX1.5 SAFETY (NEEDLE) IMPLANT
NS IRRIG 500ML POUR BTL (IV SOLUTION) ×1 IMPLANT
PACK CYSTOSCOPY (CUSTOM PROCEDURE TRAY) ×3 IMPLANT
SYR 20CC LL (SYRINGE) IMPLANT
WATER STERILE IRR 3000ML UROMA (IV SOLUTION) ×4 IMPLANT

## 2011-12-21 NOTE — Op Note (Signed)
Preoperative diagnosis: History of bladder cancer  Postoperative diagnosis: Same   Procedure: Cystoscopy, bladder biopsies, bilateral retrograde ureteropyelograms with interpretation of fluoroscopy    Surgeon: Bertram Millard. Jemima Petko, M.D.   Anesthesia: Gen.   Complications: None  Specimen(s): 1. Right renal washings for cytology 2. Left renal washings for cytology 3. Bladder biopsies  Drain(s): None  Indications:66 year old male presents for anesthetic cysto and bladder biopsy. His history is below:  He initially presented to Dr. Aldean Ast in November 2011 with a history of gross hematuria. 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 Cystoscopy revealed a bladder tumor.  He underwent anesthetic cystoscopy and TURBT on 01/15/2010. Pathology revealed high-grade urothelial dysplasia/carcinoma in situ. It was thought that he had one small area of microinvasion. There was no evidence of lymphovascular invasion.  He subsequently underwent induction BCG therapy, which was completed on 05/04/2010. His first maintenance BCG was completed on 07/27/2010.  Cystoscopy in December, 2012 revealed inflammation around his diverticulum, as well as an area of inflammation on the trigone. He underwent cystoscopy and repeat biopsy on 01/09/2011. The area of the trigone was consistent with inflammation, area around the diverticulum revealed carcinoma in situ.  He completed a second BCG induction course on 03/22/2011. At that time, he had positive cytology.  Repeat anesthetic cystoscopy was performed on 07/12/2011. Retrogrades and washings were normal bilaterally with the exception of some slight atypia noted. He had recurrent carcinoma in situ noted on bladder biopsy. His diverticulum was biopsied, and this revealed no evidence of cancer.   It was recommended at this point that he get mitomycin intravesically on a weekly regime x 6 weeks. He completed his mitomycin therapy a couple of months ago. This went without difficulty.  Since his mitomycin therapy was completed, he has had no significant urinary symptomatology, especially blood in his urine. He did have positive FISH test recently, and presents for cystoscopy, bilateral retrogrades and bladder biopsy.    Technique and findings: The patient was properly identified in the holding area and received preoperative IV antibiotics. He was taken to the operating room where general anesthetic was administered with the LMA. He was placed in the dorsolithotomy position. Genitalia and perineum were prepped and draped. Proper timeout was then performed.  A 22 French panendoscope was advanced directly through his urethra which was found to be normal. Prostate was normal, prosthetic urethra was without obstruction or lesion. The bladder was inspected circumferentially both with the 12 and 70 lenses. There was a wide mouth diverticulum superior to the right trigone. This was entered and found to have no urothelial abnormalities either within it or in the neck/entrance of the diverticulum. There was scarring superior to the trigone slightly on the right from prior biopsy site. This was without urothelial abnormality. There were scattered trabeculations. The only urothelial abnormality noted was at the right anterior wall, just superior to the bladder neck. There was approximately 10 x 12 mm area of raised urothelium. A biopsy was taken from the previously mentioned all biopsy site in the posterior bladder and sent as bladder biopsy. Additionally, several other biopsies were taken with the cold cup forceps of a lesion anteriorly at the bladder neck. This was totally removed with the cold cup biopsy forceps. This was also sent as bladder biopsy. Following biopsies excision of this area, both  biopsy sites  were cauterized with the Bugbee electrode until hemostatic. I then performed bilateral retrograde ureteropyelograms using a 6 [place] open-ended catheter. Contrast was used to fill the ureters and the pyelo-calyceal systems bilaterally.  Interpretation of these areas revealed a normal ureter bilaterally, without evidence of filling defect, narrowing or hydroureter. Both pyelocalyceal systems were normal. There was no filling defect on either side. Renal washings were taken sequentially in the left and right side, and sent separately as "left renal washings" and "right renal washings" respectively for cytology. Following this, the biopsy sites were again inspected and found to be hemostatic. The bladder was drained, the scope removed, and the procedure terminated. The patient was awakened and then taken to the PACU in stable condition.

## 2011-12-21 NOTE — Anesthesia Preprocedure Evaluation (Signed)
Anesthesia Evaluation  Patient identified by MRN, date of birth, ID band Patient awake    Reviewed: Allergy & Precautions, H&P , NPO status , Patient's Chart, lab work & pertinent test results  Airway Mallampati: II TM Distance: >3 FB Neck ROM: Full    Dental No notable dental hx.    Pulmonary neg pulmonary ROS,  breath sounds clear to auscultation  Pulmonary exam normal       Cardiovascular hypertension, Pt. on medications Rhythm:Regular Rate:Normal     Neuro/Psych negative neurological ROS  negative psych ROS   GI/Hepatic negative GI ROS, Neg liver ROS,   Endo/Other  negative endocrine ROSdiabetesMorbid obesity  Renal/GU negative Renal ROS  negative genitourinary   Musculoskeletal negative musculoskeletal ROS (+)   Abdominal   Peds negative pediatric ROS (+)  Hematology negative hematology ROS (+)   Anesthesia Other Findings   Reproductive/Obstetrics negative OB ROS                           Anesthesia Physical Anesthesia Plan  ASA: III  Anesthesia Plan: General   Post-op Pain Management:    Induction: Intravenous  Airway Management Planned: LMA  Additional Equipment:   Intra-op Plan:   Post-operative Plan:   Informed Consent: I have reviewed the patients History and Physical, chart, labs and discussed the procedure including the risks, benefits and alternatives for the proposed anesthesia with the patient or authorized representative who has indicated his/her understanding and acceptance.   Dental advisory given  Plan Discussed with: CRNA and Surgeon  Anesthesia Plan Comments:         Anesthesia Quick Evaluation

## 2011-12-21 NOTE — Anesthesia Postprocedure Evaluation (Signed)
  Anesthesia Post-op Note  Patient: Chad Olson  Procedure(s) Performed: Procedure(s) (LRB): CYSTOSCOPY WITH RETROGRADE PYELOGRAM (Bilateral) CYSTOSCOPY WITH BIOPSY (N/A)  Patient Location: PACU  Anesthesia Type: General  Level of Consciousness: awake and alert   Airway and Oxygen Therapy: Patient Spontanous Breathing  Post-op Pain: mild  Post-op Assessment: Post-op Vital signs reviewed, Patient's Cardiovascular Status Stable, Respiratory Function Stable, Patent Airway and No signs of Nausea or vomiting  Last Vitals:  Filed Vitals:   12/21/11 1045  BP: 127/78  Pulse: 61  Temp:   Resp: 13    Post-op Vital Signs: stable   Complications: No apparent anesthesia complications

## 2011-12-21 NOTE — Anesthesia Procedure Notes (Signed)
Procedure Name: LMA Insertion Date/Time: 12/21/2011 9:50 AM Performed by: Maris Berger T Pre-anesthesia Checklist: Patient identified, Emergency Drugs available, Suction available and Patient being monitored Patient Re-evaluated:Patient Re-evaluated prior to inductionOxygen Delivery Method: Circle System Utilized Preoxygenation: Pre-oxygenation with 100% oxygen Intubation Type: IV induction Ventilation: Mask ventilation without difficulty LMA: LMA with gastric port inserted LMA Size: 5.0 Number of attempts: 1 Placement Confirmation: positive ETCO2 Tube secured with: Tape Dental Injury: Teeth and Oropharynx as per pre-operative assessment

## 2011-12-21 NOTE — Transfer of Care (Signed)
Immediate Anesthesia Transfer of Care Note  Patient: Chad Olson  Procedure(s) Performed: Procedure(s) (LRB) with comments: CYSTOSCOPY WITH RETROGRADE PYELOGRAM (Bilateral) -   CYSTOSCOPY WITH BIOPSY (N/A)  Patient Location: PACU  Anesthesia Type:General  Level of Consciousness: awake, alert  and oriented  Airway & Oxygen Therapy: Patient Spontanous Breathing and Patient connected to nasal cannula oxygen  Post-op Assessment: Report given to PACU RN  Post vital signs: Reviewed and stable  Complications: No apparent anesthesia complications
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
## 2011-12-21 NOTE — Interval H&P Note (Signed)
History and Physical Interval Note:  12/21/2011 9:41 AM  Chad Olson  has presented today for surgery, with the diagnosis of BLADDER CANCER  The various methods of treatment have been discussed with the patient and family. After consideration of risks, benefits and other options for treatment, the patient has consented to  Procedure(s) (LRB) with comments: CYSTOSCOPY WITH RETROGRADE PYELOGRAM (Bilateral) -   CYSTOSCOPY WITH BIOPSY (N/A) as a surgical intervention .  The patient's history has been reviewed, patient examined, no change in status, stable for surgery.  I have reviewed the patient's chart and labs.  Questions were answered to the patient's satisfaction.     Chelsea Aus

## 2011-12-22 ENCOUNTER — Encounter (HOSPITAL_BASED_OUTPATIENT_CLINIC_OR_DEPARTMENT_OTHER): Payer: Self-pay | Admitting: Urology

## 2012-01-03 DIAGNOSIS — I739 Peripheral vascular disease, unspecified: Secondary | ICD-10-CM

## 2012-01-03 HISTORY — DX: Peripheral vascular disease, unspecified: I73.9

## 2012-02-03 DIAGNOSIS — C801 Malignant (primary) neoplasm, unspecified: Secondary | ICD-10-CM

## 2012-02-03 HISTORY — DX: Malignant (primary) neoplasm, unspecified: C80.1

## 2012-02-06 ENCOUNTER — Other Ambulatory Visit: Payer: Self-pay | Admitting: Urology

## 2012-02-17 ENCOUNTER — Other Ambulatory Visit: Payer: Self-pay

## 2012-02-19 ENCOUNTER — Encounter (HOSPITAL_COMMUNITY): Payer: Self-pay | Admitting: Pharmacy Technician

## 2012-02-21 ENCOUNTER — Inpatient Hospital Stay (HOSPITAL_COMMUNITY)
Admission: RE | Admit: 2012-02-21 | Discharge: 2012-02-28 | DRG: 303 | Disposition: A | Discharge status: Home Health | Payer: BC Managed Care – PPO | Source: Ambulatory Visit | Attending: Urology | Admitting: Urology

## 2012-02-21 ENCOUNTER — Encounter (HOSPITAL_COMMUNITY): Payer: Self-pay | Admitting: *Deleted

## 2012-02-21 DIAGNOSIS — Z7982 Long term (current) use of aspirin: Secondary | ICD-10-CM

## 2012-02-21 DIAGNOSIS — E119 Type 2 diabetes mellitus without complications: Secondary | ICD-10-CM | POA: Diagnosis present

## 2012-02-21 DIAGNOSIS — K219 Gastro-esophageal reflux disease without esophagitis: Secondary | ICD-10-CM | POA: Diagnosis present

## 2012-02-21 DIAGNOSIS — E669 Obesity, unspecified: Secondary | ICD-10-CM | POA: Diagnosis present

## 2012-02-21 DIAGNOSIS — K560 Paralytic ileus: Secondary | ICD-10-CM | POA: Diagnosis not present

## 2012-02-21 DIAGNOSIS — Z6837 Body mass index (BMI) 37.0-37.9, adult: Secondary | ICD-10-CM

## 2012-02-21 DIAGNOSIS — Z791 Long term (current) use of non-steroidal anti-inflammatories (NSAID): Secondary | ICD-10-CM

## 2012-02-21 DIAGNOSIS — I10 Essential (primary) hypertension: Secondary | ICD-10-CM | POA: Diagnosis present

## 2012-02-21 DIAGNOSIS — C679 Malignant neoplasm of bladder, unspecified: Principal | ICD-10-CM | POA: Diagnosis present

## 2012-02-21 DIAGNOSIS — Z79899 Other long term (current) drug therapy: Secondary | ICD-10-CM

## 2012-02-21 DIAGNOSIS — E785 Hyperlipidemia, unspecified: Secondary | ICD-10-CM | POA: Diagnosis present

## 2012-02-21 LAB — COMPREHENSIVE METABOLIC PANEL
Albumin: 3.7 g/dL (ref 3.5–5.2)
Alkaline Phosphatase: 61 U/L (ref 39–117)
BUN: 17 mg/dL (ref 6–23)
Creatinine, Ser: 0.75 mg/dL (ref 0.50–1.35)
Potassium: 4.2 mEq/L (ref 3.5–5.1)
Total Protein: 7 g/dL (ref 6.0–8.3)

## 2012-02-21 LAB — MRSA PCR SCREENING: MRSA by PCR: NEGATIVE

## 2012-02-21 LAB — CBC
HCT: 49.5 % (ref 39.0–52.0)
MCHC: 33.7 g/dL (ref 30.0–36.0)
Platelets: 175 10*3/uL (ref 150–400)
RDW: 12.9 % (ref 11.5–15.5)

## 2012-02-21 MED ORDER — PIPERACILLIN-TAZOBACTAM 3.375 G IVPB 30 MIN
3.3750 g | Freq: Once | INTRAVENOUS | Status: AC
  Administered 2012-02-21: 3.375 g via INTRAVENOUS
  Filled 2012-02-21: qty 50

## 2012-02-21 MED ORDER — PEG 3350-KCL-NA BICARB-NACL 420 G PO SOLR
4000.0000 mL | Freq: Once | ORAL | Status: AC
  Administered 2012-02-21: 4000 mL via ORAL

## 2012-02-21 NOTE — Consult Note (Signed)
WOC ostomy consult: Preoperative stoma site selection (OR tomorrow, 2/20); per Dr. Emmaline Life request.  Patient's abdomen assessed in both the sitting and  standing positions.  Abdomen is rotund and firm; patient wears pants low, beneath abdominal roll.  Site selected is 7cm to the right of the umbilicus and 4.5cm above.  Skin marking pen is used to mark and site marking is covered with a thin film transparent dressing.  Patient understands that there are nurses specifically for this type of diversion and that our department will be following him post operatively as educators and resources.  Patient indicated that he is eager to learn and that he would perhaps like his wife and her sister to participate in educational sessions as he wears two healing aides and is worried about "missing something important".  Patient reassured that instruction will be paced and tailored to his needs and that we welcome the attendance of others who care about him. WOC Team will follow along with you. I will not follow.  Please re-consult if needed. Thanks, Ladona Mow, MSN, RN, [facility], CWOCN [phone])
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
## 2012-02-22 ENCOUNTER — Encounter (HOSPITAL_COMMUNITY)
Admission: RE | Disposition: A | Discharge status: Home Health | Payer: Self-pay | Source: Ambulatory Visit | Attending: Urology

## 2012-02-22 ENCOUNTER — Encounter (HOSPITAL_COMMUNITY): Payer: Self-pay | Admitting: Anesthesiology

## 2012-02-22 ENCOUNTER — Inpatient Hospital Stay (HOSPITAL_COMMUNITY): Payer: BC Managed Care – PPO | Admitting: Anesthesiology

## 2012-02-22 HISTORY — PX: CYSTOSCOPY: SHX5120

## 2012-02-22 HISTORY — PX: LYMPHADENECTOMY: SHX5960

## 2012-02-22 HISTORY — PX: ROBOT ASSISTED LAPAROSCOPIC COMPLETE CYSTECT ILEAL CONDUIT: SHX5139

## 2012-02-22 LAB — TYPE AND SCREEN: ABO/RH(D): A POS

## 2012-02-22 LAB — BASIC METABOLIC PANEL
BUN: 14 mg/dL (ref 6–23)
Calcium: 8.7 mg/dL (ref 8.4–10.5)
GFR calc Af Amer: 66 mL/min — ABNORMAL LOW (ref >90)
GFR calc non Af Amer: 57 mL/min — ABNORMAL LOW (ref >90)
Glucose, Bld: 197 mg/dL — ABNORMAL HIGH (ref 70–99)
Sodium: 138 mEq/L (ref 135–145)

## 2012-02-22 LAB — HEMOGLOBIN AND HEMATOCRIT, BLOOD: HCT: 49.7 % (ref 39.0–52.0)

## 2012-02-22 SURGERY — ROBOTIC ASSISTED LAPAROSCOPIC COMPLETE CYSTECT ILEAL CONDUIT
Anesthesia: General | Wound class: Clean Contaminated

## 2012-02-22 MED ORDER — LISINOPRIL 20 MG PO TABS
30.0000 mg | ORAL_TABLET | Freq: Every day | ORAL | Status: DC
  Administered 2012-02-23 – 2012-02-27 (×5): 30 mg via ORAL
  Filled 2012-02-22 (×7): qty 1

## 2012-02-22 MED ORDER — NEOSTIGMINE METHYLSULFATE 1 MG/ML IJ SOLN
INTRAMUSCULAR | Status: DC | PRN
  Administered 2012-02-22: 4 mg via INTRAVENOUS

## 2012-02-22 MED ORDER — LACTATED RINGERS IV BOLUS (SEPSIS): 1000.0000 mL | Freq: Once | INTRAVENOUS | Status: DC

## 2012-02-22 MED ORDER — HYDROMORPHONE HCL PF 1 MG/ML IJ SOLN
0.2500 mg | INTRAMUSCULAR | Status: DC | PRN
  Administered 2012-02-22 (×2): 0.5 mg via INTRAVENOUS

## 2012-02-22 MED ORDER — ONDANSETRON HCL 4 MG/2ML IJ SOLN
4.0000 mg | INTRAMUSCULAR | Status: DC | PRN
  Administered 2012-02-24 – 2012-02-25 (×4): 4 mg via INTRAVENOUS
  Filled 2012-02-22 (×5): qty 2

## 2012-02-22 MED ORDER — PIPERACILLIN-TAZOBACTAM 3.375 G IVPB 30 MIN
3.3750 g | Freq: Once | INTRAVENOUS | Status: AC
  Administered 2012-02-22: 3.375 g via INTRAVENOUS
  Filled 2012-02-22 (×2): qty 50

## 2012-02-22 MED ORDER — MIDAZOLAM HCL 5 MG/5ML IJ SOLN
INTRAMUSCULAR | Status: DC | PRN
  Administered 2012-02-22: 2 mg via INTRAVENOUS

## 2012-02-22 MED ORDER — LACTATED RINGERS IV SOLN: INTRAVENOUS | Status: DC

## 2012-02-22 MED ORDER — FENTANYL CITRATE 0.05 MG/ML IJ SOLN
INTRAMUSCULAR | Status: DC | PRN
  Administered 2012-02-22 (×3): 50 ug via INTRAVENOUS
  Administered 2012-02-22 (×2): 100 ug via INTRAVENOUS
  Administered 2012-02-22 (×11): 50 ug via INTRAVENOUS
  Administered 2012-02-22: 100 ug via INTRAVENOUS

## 2012-02-22 MED ORDER — LACTATED RINGERS IV SOLN
INTRAVENOUS | Status: DC | PRN
  Administered 2012-02-22 (×2): via INTRAVENOUS

## 2012-02-22 MED ORDER — NALOXONE HCL 0.4 MG/ML IJ SOLN: 0.4000 mg | INTRAMUSCULAR | Status: DC | PRN

## 2012-02-22 MED ORDER — ROCURONIUM BROMIDE 100 MG/10ML IV SOLN
INTRAVENOUS | Status: DC | PRN
  Administered 2012-02-22 (×2): 20 mg via INTRAVENOUS
  Administered 2012-02-22: 10 mg via INTRAVENOUS
  Administered 2012-02-22 (×2): 20 mg via INTRAVENOUS
  Administered 2012-02-22: 10 mg via INTRAVENOUS
  Administered 2012-02-22 (×3): 20 mg via INTRAVENOUS
  Administered 2012-02-22: 10 mg via INTRAVENOUS
  Administered 2012-02-22: 50 mg via INTRAVENOUS

## 2012-02-22 MED ORDER — HEPARIN SODIUM (PORCINE) 5000 UNIT/ML IJ SOLN
5000.0000 [IU] | Freq: Three times a day (TID) | INTRAMUSCULAR | Status: DC
  Administered 2012-02-23 – 2012-02-28 (×16): 5000 [IU] via SUBCUTANEOUS
  Filled 2012-02-22 (×21): qty 1

## 2012-02-22 MED ORDER — LACTATED RINGERS IV SOLN
INTRAVENOUS | Status: DC | PRN
  Administered 2012-02-22 (×2): via INTRAVENOUS

## 2012-02-22 MED ORDER — DIPHENHYDRAMINE HCL 12.5 MG/5ML PO ELIX: 12.5000 mg | ORAL_SOLUTION | Freq: Four times a day (QID) | ORAL | Status: DC | PRN

## 2012-02-22 MED ORDER — SODIUM CHLORIDE 0.9 % IJ SOLN: 9.0000 mL | INTRAMUSCULAR | Status: DC | PRN

## 2012-02-22 MED ORDER — ACETAMINOPHEN 10 MG/ML IV SOLN
1000.0000 mg | Freq: Four times a day (QID) | INTRAVENOUS | Status: AC
  Administered 2012-02-22 – 2012-02-23 (×4): 1000 mg via INTRAVENOUS
  Filled 2012-02-22 (×6): qty 100

## 2012-02-22 MED ORDER — ASPIRIN EC 81 MG PO TBEC
81.0000 mg | DELAYED_RELEASE_TABLET | Freq: Every day | ORAL | Status: DC
  Administered 2012-02-23 – 2012-02-28 (×6): 81 mg via ORAL
  Filled 2012-02-22 (×6): qty 1

## 2012-02-22 MED ORDER — INDOCYANINE GREEN 25 MG IV SOLR
INTRAVENOUS | Status: DC | PRN
  Administered 2012-02-22: 5 mg via INTRAVENOUS

## 2012-02-22 MED ORDER — HYDROMORPHONE 0.3 MG/ML IV SOLN
INTRAVENOUS | Status: DC
  Administered 2012-02-22: 0.3 mg via INTRAVENOUS
  Administered 2012-02-22: 17:00:00 via INTRAVENOUS
  Administered 2012-02-23: 0.9 mg via INTRAVENOUS
  Administered 2012-02-23: 2.7 mg via INTRAVENOUS
  Administered 2012-02-23: 1.5 mg via INTRAVENOUS
  Administered 2012-02-23: 0.6 mg via INTRAVENOUS
  Administered 2012-02-23 (×2): 0.3 mg via INTRAVENOUS
  Administered 2012-02-24: 0.6 mg via INTRAVENOUS
  Administered 2012-02-24: 2.1 mg via INTRAVENOUS
  Administered 2012-02-24: 0.9 mg via INTRAVENOUS
  Administered 2012-02-24: 0.6 mg via INTRAVENOUS
  Administered 2012-02-24: 0.9 mg via INTRAVENOUS
  Administered 2012-02-24: 2.1 mg via INTRAVENOUS
  Administered 2012-02-25: 0.9 mg via INTRAVENOUS
  Administered 2012-02-25: 0.6 mg via INTRAVENOUS
  Administered 2012-02-25: 06:00:00 via INTRAVENOUS
  Administered 2012-02-25 – 2012-02-26 (×3): 0.6 mg via INTRAVENOUS
  Administered 2012-02-26: 0.3 mg via INTRAVENOUS
  Administered 2012-02-26: 0.09 mg via INTRAVENOUS
  Filled 2012-02-22 (×2): qty 25

## 2012-02-22 MED ORDER — SIMVASTATIN 10 MG PO TABS
10.0000 mg | ORAL_TABLET | Freq: Every day | ORAL | Status: DC
  Administered 2012-02-23 – 2012-02-27 (×5): 10 mg via ORAL
  Filled 2012-02-22 (×6): qty 1

## 2012-02-22 MED ORDER — LISINOPRIL 20 MG PO TABS: 30.0000 mg | ORAL_TABLET | Freq: Every morning | ORAL | Status: DC

## 2012-02-22 MED ORDER — DEXAMETHASONE SODIUM PHOSPHATE 10 MG/ML IJ SOLN
INTRAMUSCULAR | Status: DC | PRN
  Administered 2012-02-22: 10 mg via INTRAVENOUS

## 2012-02-22 MED ORDER — PROPOFOL 10 MG/ML IV BOLUS
INTRAVENOUS | Status: DC | PRN
  Administered 2012-02-22: 150 mg via INTRAVENOUS

## 2012-02-22 MED ORDER — ASPIRIN 81 MG PO TABS: 81.0000 mg | ORAL_TABLET | Freq: Every day | ORAL | Status: DC

## 2012-02-22 MED ORDER — PROMETHAZINE HCL 25 MG/ML IJ SOLN
6.2500 mg | INTRAMUSCULAR | Status: DC | PRN
  Administered 2012-02-22: 6.25 mg via INTRAVENOUS

## 2012-02-22 MED ORDER — MEPERIDINE HCL 50 MG/ML IJ SOLN: 6.2500 mg | INTRAMUSCULAR | Status: DC | PRN

## 2012-02-22 MED ORDER — SODIUM CHLORIDE 0.9 % IV BOLUS (SEPSIS)
1000.0000 mL | Freq: Once | INTRAVENOUS | Status: AC
  Administered 2012-02-22: 1000 mL via INTRAVENOUS

## 2012-02-22 MED ORDER — LABETALOL HCL 5 MG/ML IV SOLN
5.0000 mg | Freq: Once | INTRAVENOUS | Status: AC
  Administered 2012-02-22: 5 mg via INTRAVENOUS

## 2012-02-22 MED ORDER — ACETAMINOPHEN 10 MG/ML IV SOLN
INTRAVENOUS | Status: DC | PRN
  Administered 2012-02-22: 1000 mg via INTRAVENOUS

## 2012-02-22 MED ORDER — DIPHENHYDRAMINE HCL 50 MG/ML IJ SOLN: 12.5000 mg | Freq: Four times a day (QID) | INTRAMUSCULAR | Status: DC | PRN

## 2012-02-22 MED ORDER — SUCCINYLCHOLINE CHLORIDE 20 MG/ML IJ SOLN
INTRAMUSCULAR | Status: DC | PRN
  Administered 2012-02-22: 100 mg via INTRAVENOUS

## 2012-02-22 MED ORDER — GLYCOPYRROLATE 0.2 MG/ML IJ SOLN
INTRAMUSCULAR | Status: DC | PRN
  Administered 2012-02-22: .6 mg via INTRAVENOUS

## 2012-02-22 MED ORDER — DOCUSATE SODIUM 100 MG PO CAPS
100.0000 mg | ORAL_CAPSULE | Freq: Two times a day (BID) | ORAL | Status: DC
  Administered 2012-02-22 – 2012-02-28 (×12): 100 mg via ORAL
  Filled 2012-02-22 (×13): qty 1

## 2012-02-22 MED ORDER — BUPIVACAINE LIPOSOME 1.3 % IJ SUSP
20.0000 mL | Freq: Once | INTRAMUSCULAR | Status: AC
  Administered 2012-02-22: 20 mL
  Filled 2012-02-22: qty 20

## 2012-02-22 MED ORDER — ONDANSETRON HCL 4 MG/2ML IJ SOLN
INTRAMUSCULAR | Status: DC | PRN
  Administered 2012-02-22: 4 mg via INTRAVENOUS

## 2012-02-22 MED ORDER — KCL IN DEXTROSE-NACL 20-5-0.45 MEQ/L-%-% IV SOLN
INTRAVENOUS | Status: DC
  Administered 2012-02-22 – 2012-02-23 (×2): 100 mL/h via INTRAVENOUS
  Administered 2012-02-24 – 2012-02-28 (×8): via INTRAVENOUS
  Filled 2012-02-22 (×14): qty 1000

## 2012-02-22 SURGICAL SUPPLY — 110 items
ADAPTER GOLDBERG URETERAL (ADAPTER) ×2 IMPLANT
ADH SKN CLS APL DERMABOND .7 (GAUZE/BANDAGES/DRESSINGS) ×2
ADPR CATH 15X14FR FL DRN BG (ADAPTER)
APL ESCP 34 STRL LF DISP (HEMOSTASIS)
APPLICATOR COTTON TIP 6IN STRL (MISCELLANEOUS) ×2 IMPLANT
APPLICATOR SURGIFLO ENDO (HEMOSTASIS) ×2 IMPLANT
BAG SPEC RTRVL LRG 6X4 10 (ENDOMECHANICALS)
BAG URO CATCHER STRL LF (DRAPE) ×3 IMPLANT
BLADE SURG SZ10 CARB STEEL (BLADE) ×2 IMPLANT
CANISTER SUCTION 2500CC (MISCELLANEOUS) ×3 IMPLANT
CHLORAPREP W/TINT 26ML (MISCELLANEOUS) ×3 IMPLANT
CLIP LIGATING HEM O LOK PURPLE (MISCELLANEOUS) ×6 IMPLANT
CLIP LIGATING HEMO LOK XL GOLD (MISCELLANEOUS) ×6 IMPLANT
CLIP LIGATING HEMO O LOK GREEN (MISCELLANEOUS) ×2 IMPLANT
CLOTH BEACON ORANGE TIMEOUT ST (SAFETY) ×3 IMPLANT
CORD HIGH FREQUENCY UNIPOLAR (ELECTROSURGICAL) ×3 IMPLANT
COVER TIP SHEARS 8 DVNC (MISCELLANEOUS) ×2 IMPLANT
COVER TIP SHEARS 8MM DA VINCI (MISCELLANEOUS) ×1
DECANTER SPIKE VIAL GLASS SM (MISCELLANEOUS) ×2 IMPLANT
DERMABOND ADVANCED (GAUZE/BANDAGES/DRESSINGS) ×1
DERMABOND ADVANCED .7 DNX12 (GAUZE/BANDAGES/DRESSINGS) IMPLANT
DRAIN CHANNEL 15F RND FF 3/16 (WOUND CARE) ×2 IMPLANT
DRAPE CAMERA CLOSED 9X96 (DRAPES) ×3 IMPLANT
DRAPE TABLE BACK 44X90 PK DISP (DRAPES) ×1 IMPLANT
DRSG TEGADERM 4X4.75 (GAUZE/BANDAGES/DRESSINGS) ×2 IMPLANT
DRSG TEGADERM 6X8 (GAUZE/BANDAGES/DRESSINGS) ×10 IMPLANT
ELECT REM PT RETURN 9FT ADLT (ELECTROSURGICAL) ×3
ELECTRODE REM PT RTRN 9FT ADLT (ELECTROSURGICAL) ×2 IMPLANT
GLOVE BIOGEL M 6.5 STRL (GLOVE) ×2 IMPLANT
GLOVE BIOGEL M STRL SZ7.5 (GLOVE) ×9 IMPLANT
GLOVE BIOGEL PI IND STRL 7.0 (GLOVE) IMPLANT
GLOVE BIOGEL PI INDICATOR 7.0 (GLOVE) ×3
GLOVE SURG SS PI 7.0 STRL IVOR (GLOVE) ×3 IMPLANT
GOWN STRL NON-REIN LRG LVL3 (GOWN DISPOSABLE) ×18 IMPLANT
GOWN STRL REIN XL XLG (GOWN DISPOSABLE) ×3 IMPLANT
GUIDEWIRE ANG ZIPWIRE 038X150 (WIRE) ×1 IMPLANT
HOLDER FOLEY CATH W/STRAP (MISCELLANEOUS) ×2 IMPLANT
LOOP MINI RED (MISCELLANEOUS) ×1 IMPLANT
LOOP VESSEL MAXI BLUE (MISCELLANEOUS) ×1 IMPLANT
MANIFOLD NEPTUNE II (INSTRUMENTS) ×2 IMPLANT
NDL INSUFFLATION 14GA 120MM (NEEDLE) IMPLANT
NDL SAFETY ECLIPSE 18X1.5 (NEEDLE) ×2 IMPLANT
NEEDLE HYPO 18GX1.5 SHARP (NEEDLE)
NEEDLE INSUFFLATION 14GA 120MM (NEEDLE) ×3 IMPLANT
NS IRRIG 1000ML POUR BTL (IV SOLUTION) ×2 IMPLANT
PACK CYSTO (CUSTOM PROCEDURE TRAY) ×3 IMPLANT
PACK ROBOT UROLOGY CUSTOM (CUSTOM PROCEDURE TRAY) ×3 IMPLANT
POSITIONER SURGICAL ARM (MISCELLANEOUS) ×4 IMPLANT
POUCH ENDO CATCH II 15MM (MISCELLANEOUS) ×3 IMPLANT
POUCH SPECIMEN RETRIEVAL 10MM (ENDOMECHANICALS) ×4 IMPLANT
RELOAD GOLD (STAPLE) ×2 IMPLANT
RELOAD GREEN (STAPLE) ×2 IMPLANT
RELOAD LINEAR CUT PROX 55 BLUE (ENDOMECHANICALS) IMPLANT
RELOAD STAPLE 55 3.8 BLU REG (ENDOMECHANICALS) ×4 IMPLANT
RELOAD WHITE ECR60W (STAPLE) ×19 IMPLANT
SET TUBE IRRIG SUCTION NO TIP (IRRIGATION / IRRIGATOR) ×3 IMPLANT
SOLUTION ELECTROLUBE (MISCELLANEOUS) ×3 IMPLANT
SPONGE GAUZE 4X4 12PLY (GAUZE/BANDAGES/DRESSINGS) ×2 IMPLANT
SPONGE LAP 18X18 X RAY DECT (DISPOSABLE) ×2 IMPLANT
SPONGE LAP 4X18 X RAY DECT (DISPOSABLE) ×2 IMPLANT
STAPLE ECHEON FLEX 60 POW ENDO (STAPLE) ×5 IMPLANT
STAPLER GUN LINEAR PROX 60 (STAPLE) ×2 IMPLANT
STAPLER PROXIMATE 55 BLUE (STAPLE) ×2 IMPLANT
STENT CONTOUR 6FRX26X.038 (STENTS) ×2 IMPLANT
STENT CONTOUR 7FRX24X.038 (STENTS) ×4 IMPLANT
STENT SINGLE 7F (STENTS) IMPLANT
SURGIFLO W/THROMBIN 8M KIT (HEMOSTASIS) ×2 IMPLANT
SUT MNCRL 3 0 RB1 (SUTURE) IMPLANT
SUT MNCRL 3 0 VIOLET RB1 (SUTURE) IMPLANT
SUT MNCRL AB 4-0 PS2 18 (SUTURE) ×2 IMPLANT
SUT MON AB 2-0 SH 27 (SUTURE) ×2 IMPLANT
SUT MON AB 2-0 SH27 (SUTURE) ×2 IMPLANT
SUT MONOCRYL 3 0 RB1 (SUTURE) ×5
SUT PDS AB 0 CTX 36 PDP370T (SUTURE) ×3 IMPLANT
SUT PDS AB 1 CTX 36 (SUTURE) ×4 IMPLANT
SUT PDS AB 3-0 SH 27 (SUTURE) IMPLANT
SUT PDS AB 4-0 RB1 27 (SUTURE) ×8 IMPLANT
SUT PDS AB 4-0 SH 27 (SUTURE) ×10 IMPLANT
SUT SILK 0 (SUTURE)
SUT SILK 0 30XBRD TIE 6 (SUTURE) ×4 IMPLANT
SUT SILK 2 0 (SUTURE)
SUT SILK 2 0 SH CR/8 (SUTURE) ×4 IMPLANT
SUT SILK 2-0 30XBRD TIE 12 (SUTURE) ×2 IMPLANT
SUT SILK 3 0 (SUTURE) ×3
SUT SILK 3 0 12 30 (SUTURE) ×2 IMPLANT
SUT SILK 3 0 SH 30 (SUTURE) ×5 IMPLANT
SUT SILK 3-0 18XBRD TIE 12 (SUTURE) ×4 IMPLANT
SUT SILK 3-0 FS1 18XBRD (SUTURE) IMPLANT
SUT VIC AB 0 CT1 27 (SUTURE)
SUT VIC AB 0 CT1 27XBRD ANTBC (SUTURE) ×12 IMPLANT
SUT VIC AB 1 BRD 54 (SUTURE) ×4 IMPLANT
SUT VIC AB 2-0 SH 27 (SUTURE) ×6
SUT VIC AB 2-0 SH 27X BRD (SUTURE) ×4 IMPLANT
SUT VIC AB 2-0 UR5 27 (SUTURE) ×2 IMPLANT
SUT VIC AB 3-0 SH 27 (SUTURE) ×12
SUT VIC AB 3-0 SH 27X BRD (SUTURE) ×2 IMPLANT
SUT VIC AB 3-0 SH 27XBRD (SUTURE) IMPLANT
SUT VIC AB 4-0 SH 18 (SUTURE) ×2 IMPLANT
SUT VICRYL 0 UR6 27IN ABS (SUTURE) ×8 IMPLANT
SUT VLOC BARB 180 ABS3/0GR12 (SUTURE) ×3
SUTURE VLOC BRB 180 ABS3/0GR12 (SUTURE) IMPLANT
SYR 3ML LL SCALE MARK (SYRINGE) ×2 IMPLANT
SYSTEM UROSTOMY GENTLE TOUCH (WOUND CARE) ×2 IMPLANT
TROCAR 12M 150ML BLUNT (TROCAR) ×1 IMPLANT
TROCAR BLADELESS 15MM (ENDOMECHANICALS) ×1 IMPLANT
TROCAR XCEL 12X100 BLDLESS (ENDOMECHANICALS) ×2 IMPLANT
TUBING CONNECTING 10 (TUBING) ×3 IMPLANT
URINEMETER 200ML W/220 (MISCELLANEOUS) ×2 IMPLANT
WATER STERILE IRR 1500ML POUR (IV SOLUTION) ×5 IMPLANT
YANKAUER SUCT BULB TIP 10FT TU (MISCELLANEOUS) ×2 IMPLANT

## 2012-02-22 NOTE — Anesthesia Preprocedure Evaluation (Signed)
Anesthesia Evaluation  Patient identified by MRN, date of birth, ID band Patient awake    Reviewed: Allergy & Precautions, H&P , NPO status , Patient's Chart, lab work & pertinent test results  Airway Mallampati: II TM Distance: >3 FB Neck ROM: Full    Dental no notable dental hx.    Pulmonary neg pulmonary ROS,  breath sounds clear to auscultation  Pulmonary exam normal       Cardiovascular hypertension, Pt. on medications Rhythm:Regular Rate:Normal     Neuro/Psych negative neurological ROS  negative psych ROS   GI/Hepatic negative GI ROS, Neg liver ROS,   Endo/Other  negative endocrine ROSdiabetesMorbid obesity  Renal/GU negative Renal ROS  negative genitourinary   Musculoskeletal negative musculoskeletal ROS (+)   Abdominal   Peds negative pediatric ROS (+)  Hematology negative hematology ROS (+)   Anesthesia Other Findings   Reproductive/Obstetrics negative OB ROS                           Anesthesia Physical  Anesthesia Plan  ASA: III  Anesthesia Plan: General   Post-op Pain Management:    Induction: Intravenous  Airway Management Planned: Oral ETT  Additional Equipment:   Intra-op Plan:   Post-operative Plan:   Informed Consent: I have reviewed the patients History and Physical, chart, labs and discussed the procedure including the risks, benefits and alternatives for the proposed anesthesia with the patient or authorized representative who has indicated his/her understanding and acceptance.   Dental advisory given  Plan Discussed with: CRNA and Surgeon  Anesthesia Plan Comments:         Anesthesia Quick Evaluation

## 2012-02-22 NOTE — Transfer of Care (Signed)
Immediate Anesthesia Transfer of Care Note  Patient: Chad Olson  Procedure(s) Performed: Procedure(s) with comments: ROBOTIC CYSTOPROSTATECTOMY, ILEAL CONDUIT,BILATERAL PELVIC  LYMPH NODE DISSECTION  (N/A) - ROBOTIC CYSTOPROSTATECTOMY, ILEAL CONDUIT,BILATERAL PELVIC  LYMPH NODE DISSECTION  LYMPHADENECTOMY (Bilateral) CYSTOSCOPY (N/A)  Patient Location: PACU  Anesthesia Type:General  Level of Consciousness: awake, alert , oriented and patient cooperative  Airway & Oxygen Therapy: Patient Spontanous Breathing and Patient connected to face mask oxygen  Post-op Assessment: Report given to PACU RN and Post -op Vital signs reviewed and stable  Post vital signs: Reviewed and stable  Complications: No apparent anesthesia complications

## 2012-02-22 NOTE — Anesthesia Postprocedure Evaluation (Signed)
  Anesthesia Post-op Note  Patient: Chad Olson  Procedure(s) Performed: Procedure(s) (LRB): ROBOTIC CYSTOPROSTATECTOMY, ILEAL CONDUIT,BILATERAL PELVIC  LYMPH NODE DISSECTION  (N/A) LYMPHADENECTOMY (Bilateral) CYSTOSCOPY (N/A)  Patient Location: PACU  Anesthesia Type: General  Level of Consciousness: awake and alert   Airway and Oxygen Therapy: Patient Spontanous Breathing  Post-op Pain: mild  Post-op Assessment: Post-op Vital signs reviewed, Patient's Cardiovascular Status Stable, Respiratory Function Stable, Patent Airway and No signs of Nausea or vomiting  Last Vitals:  Filed Vitals:   02/22/12 1638  BP:   Pulse:   Temp:   Resp: 16    Post-op Vital Signs: stable   Complications: No apparent anesthesia complications

## 2012-02-22 NOTE — Preoperative (Signed)
Beta Blockers   Reason not to administer Beta Blockers:Not Applicable 

## 2012-02-22 NOTE — Progress Notes (Signed)
PACU note----pt's heart rate 100 and urine output 40 cc for PACU; Dr. Acey Lav notified, order rec'd and med given

## 2012-02-22 NOTE — Progress Notes (Signed)
Day of Surgery  Subjective:  1 - Bladder Cancer - Plan for robotic cystoprostatectomy today.   Today Chad Olson is w/o complaints. He complete bowel prep and stomal marking. Labs from yesterday wnl. Did have some small volume rectal bleeding from known hemmmroids associated with bowel prep.    Objective: Vital signs in last 24 hours: Temp:  [97.6 F (36.4 C)-97.9 F (36.6 C)] 97.9 F (36.6 C) (02/20 0501) Pulse Rate:  [60-64] 60 (02/20 0501) Resp:  [18-20] 18 (02/20 0501) BP: (111-132)/(80-90) 111/90 mmHg (02/20 0501) SpO2:  [98 %-100 %] 98 % (02/20 0501) Weight:  [121.2 kg (267 lb 3.2 oz)] 121.2 kg (267 lb 3.2 oz) (02/19 0850) Last BM Date: 02/21/12  Intake/Output from previous day: 02/19 0701 - 02/20 0700 In: 720 [P.O.:720] Out: -  Intake/Output this shift:    General appearance: alert, cooperative, appears stated age and Family in room Head: Normocephalic, without obvious abnormality, atraumatic Eyes: conjunctivae/corneas clear. PERRL, EOM's intact. Fundi benign. Ears: normal TM's and external ear canals both ears Nose: Nares normal. Septum midline. Mucosa normal. No drainage or sinus tenderness. Throat: lips, mucosa, and tongue normal; teeth and gums normal Neck: no adenopathy, no carotid bruit, no JVD, supple, symmetrical, trachea midline and thyroid not enlarged, symmetric, no tenderness/mass/nodules Back: symmetric, no curvature. ROM normal. No CVA tenderness. Resp: clear to auscultation bilaterally Chest wall: no tenderness Cardio: regular rate and rhythm, S1, S2 normal, no murmur, click, rub or gallop GI: soft, non-tender; bowel sounds normal; no masses,  no organomegaly and truncal obesity Male genitalia: normal Extremities: extremities normal, atraumatic, no cyanosis or edema Pulses: 2+ and symmetric Skin: Skin color, texture, turgor normal. No rashes or lesions Lymph nodes: Cervical, supraclavicular, and axillary nodes normal. Neurologic: Grossly normal  Lab  Results:   Recent Labs  02/21/12 1016  WBC 6.0  HGB 16.7  HCT 49.5  PLT 175   BMET  Recent Labs  02/21/12 1016  NA 140  K 4.2  CL 107  CO2 23  GLUCOSE 122*  BUN 17  CREATININE 0.75  CALCIUM 9.0   PT/INR No results found for this basename: LABPROT, INR,  in the last 72 hours ABG No results found for this basename: PHART, PCO2, PO2, HCO3,  in the last 72 hours  Studies/Results: No results found.  Anti-infectives: Anti-infectives   Start     Dose/Rate Route Frequency Ordered Stop   02/21/12 1000  piperacillin-tazobactam (ZOSYN) IVPB 3.375 g     3.375 g 100 mL/hr over 30 Minutes Intravenous  Once 02/21/12 0850 02/21/12 1242      Assessment/Plan:  1 - Bladder Cancer - Proceed with surgery today. Risk and benefits discussed again. All questions answered.  [facility], Jovaun Levene 02/22/2012

## 2012-02-22 NOTE — Brief Op Note (Signed)
02/21/2012 - 02/22/2012  4:04 PM  PATIENT:  Chad Olson  67 y.o. male  PRE-OPERATIVE DIAGNOSIS:  BLADDER CANCER  POST-OPERATIVE DIAGNOSIS:  BLADDER CANCER  PROCEDURE:  Procedure(s) with comments: ROBOTIC CYSTOPROSTATECTOMY, ILEAL CONDUIT,BILATERAL PELVIC  LYMPH NODE DISSECTION  (N/A) - ROBOTIC CYSTOPROSTATECTOMY, ILEAL CONDUIT,BILATERAL PELVIC  LYMPH NODE DISSECTION  LYMPHADENECTOMY (Bilateral) CYSTOSCOPY (N/A)  SURGEON:  Surgeon(s) and Role:    * Sebastian Ache, MD - Primary  PHYSICIAN ASSISTANT:   ASSISTANTS: Lujean Rave, PA    ANESTHESIA:   general  EBL:  Total I/O In: 3100 [I.V.:3100] Out: 450 [Urine:50; Blood:400]  BLOOD ADMINISTERED:none  DRAINS: JP to bulb suction in LLQ   LOCAL MEDICATIONS USED:  MARCAINE     SPECIMEN:  Source of Specimen:  1 - Rt and Lt distal ureteral margins, 2 - Cystoprostatectomy, 3 - Rt and Lt external iliac + obturator  lymph nodes  DISPOSITION OF SPECIMEN:  PATHOLOGY  COUNTS:  YES  TOURNIQUET:  * No tourniquets in log *  DICTATION: .Other Dictation: Dictation Number [id number]  PLAN OF CARE: Admit to inpatient   PATIENT DISPOSITION:  PACU - hemodynamically stable.   Delay start of Pharmacological VTE agent (>24hrs) due to surgical blood loss or risk of bleeding: yes

## 2012-02-22 NOTE — Progress Notes (Signed)
PACU note-----pt had small amt emesis, bloody mucus, mouth suctioned; med for nausea given

## 2012-02-23 ENCOUNTER — Encounter (HOSPITAL_COMMUNITY): Payer: Self-pay | Admitting: Urology

## 2012-02-23 LAB — BASIC METABOLIC PANEL
CO2: 23 mEq/L (ref 19–32)
Calcium: 7.9 mg/dL — ABNORMAL LOW (ref 8.4–10.5)
Creatinine, Ser: 1 mg/dL (ref 0.50–1.35)
GFR calc non Af Amer: 76 mL/min — ABNORMAL LOW (ref >90)
Glucose, Bld: 175 mg/dL — ABNORMAL HIGH (ref 70–99)

## 2012-02-23 MED ORDER — BIOTENE DRY MOUTH MT LIQD
15.0000 mL | Freq: Two times a day (BID) | OROMUCOSAL | Status: DC
  Administered 2012-02-23 – 2012-02-28 (×11): 15 mL via OROMUCOSAL

## 2012-02-23 MED ORDER — VITAMINS A & D EX OINT
TOPICAL_OINTMENT | CUTANEOUS | Status: AC
  Administered 2012-02-23: 16:00:00
  Filled 2012-02-23: qty 5

## 2012-02-23 MED ORDER — BISACODYL 10 MG RE SUPP
10.0000 mg | Freq: Every day | RECTAL | Status: DC
  Administered 2012-02-23 – 2012-02-28 (×5): 10 mg via RECTAL
  Filled 2012-02-23 (×6): qty 1

## 2012-02-23 NOTE — Progress Notes (Signed)
Patient transferring to room 1418.  Report called to Merlyn Albert, Charity fundraiser.  Patient to travel by bed.  Will continue to monitor.

## 2012-02-23 NOTE — Care Management Note (Signed)
    Page 1 of 2   02/28/2012     1:41:59 PM   CARE MANAGEMENT NOTE 02/28/2012  Patient:  Chad Olson, Chad Olson   Account Number:  [id number]  Date Initiated:  02/23/2012  Documentation initiated by:  Lanier Clam  Subjective/Objective Assessment:   ADMITTED W/RECURRENT BLADDER CA.     Action/Plan:   FROM HOME W/SPOUSE.HAS PCP,PHARMACY.   Anticipated DC Date:  02/28/2012   Anticipated DC Plan:  HOME W HOME HEALTH SERVICES      DC Planning Services  CM consult      Choice offered to / List presented to:  C-1 Patient        HH arranged  HH-2 PT  HH-1 RN      [facility] agency  [facility]   Status of service:  Completed, signed off Medicare Important Message given?  NA - LOS <3 / Initial given by admissions (If response is "NO", the following Medicare IM given date fields will be blank) Date Medicare IM given:   Date Additional Medicare IM given:    Discharge Disposition:  HOME W HOME HEALTH SERVICES  Per UR Regulation:  Reviewed for med. necessity/level of care/duration of stay  If discussed at Long Length of Stay Meetings, dates discussed:   02/27/2012    Comments:  02/28/12 April Carlyon RN,BSN NCM [phone] [facility]-HHRN/PT ORDERED,SPOKE TO JAMIE(REP) AWARE OF D/C & ORDERS.  02/27/12 Lorella Gomez RN,BSN NCM [phone] POD#4,AMBULATING HALLS.PT-HH.[facility] DEBBIE ,& LIKELY D/C IN AM HOME.IF HOME W/UROSTOMY & HHRN NEEDED, CAN ARRANGE IF HHRN ORDERS PUT IN.  02/26/12 Reda Gettis RN,BSN NCM [phone] RUQ UROSTOMY,ADV DIET CLEARS.DILAUDID PCA,BENADRYL IV.PT-HH.[facility] KIRSTEN FOLLOWING FOR HHRN/PT.  02/23/12 Chauncey Bruno RN,BSN NCM [phone] POD#1 ROBOTIC RADICAL CYSTOPROSTATECTOMY.

## 2012-02-23 NOTE — Op Note (Signed)
NAME:  Chad Olson, Chad Olson NO.:  [id number]  MEDICAL RECORD NO.:  [id number]  LOCATION:  1236                         FACILITY:  [facility]  PHYSICIAN:  Sebastian Ache, MD     DATE OF BIRTH:  [birth year]  DATE OF PROCEDURE:  02/22/2012 DATE OF DISCHARGE:                              OPERATIVE REPORT   DIAGNOSIS:  Refractory, recurrent, high-grade, noninvasive bladder cancer.  PROCEDURES: 1. Robotic-assisted laparoscopic cystoprostatectomy. 2. Bilateral extended pelvic lymphadenectomy. 3. Intracorporeal ileal conduit urinary diversion. 4. Cystoscopy with a tumor injection of dye. 5. Mesenteric angiography.  ESTIMATED BLOOD LOSS:  350 mL.  COMPLICATIONS:  None.  FINDINGS: 1. Large amount of intra-abdominal and perivesical fat. 2. No obvious enlarged lymph nodes in the pelvis. 3. Excellent bowel vascularity of the conduit and anastomotic segments     with ICG angiography. 4. No hyperfluorescent sentinel lymph nodes in the pelvis.  DRAINS: 1. Ohaver-Pratt to bulb suction. 2. Ileal conduit urinary diversion to urostomy appliance with 18-     French red rubber in situ.  There are also bilateral 6 x 26 double-     J stents proximal and right and left renal pelvis, butt end  of the     conduit.  SPECIMENS: 1. Right and left external iliac lymph nodes. 2. Right and left obturator lymph nodes. 3. Right and left distal ureteral margins, negative progression on the     frozen section. 4. Cystoprostatectomy.   ASSISTANT:  Pecola Leisure, PA.  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  He has been managed for several years with local therapy including  induction BCG and maintenance BCG.  However, he has been refractory in terms of recurrence of high-grade disease.  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  Further options of  management were discussed including continued endoscopic and topical chemotherapy management versus more definitive therapy with cystoprostatectomy with or without minimally invasive assistance with various forms of urinary diversion, and the patient wished to proceed with robotic cystectomy with ileal conduit urinary diversion.  Informed consent was obtained and placed in the medical record.  PROCEDURE IN DETAIL:  The patient is being Chad Olson, was verified. Procedure being robotic cystectomy was confirmed.  Procedure was carried out.  Time-out was performed.  Intravenous antibiotics were administered.  General endotracheal anesthesia was reduced.  The patient was placed into a low lithotomy position.  Sterile field was created by prepping and draping the patient's penis, perineum, and proximal thighs using iodine x3.  His arms were tucked and very carefully padded as well over his lateral knees.  He was placed in steep Trendelenburg position was performed and was found to be stable in position.  Attention was then directed at the cystoscopy and tumor injection and cystourethroscopy was performed using a 22-French rigid cystoscope with a 0 degree lens. Inspection of urinary bladder revealed papillary changes in erythema within the trigone as well as a small right side of bladder diverticulum changes, consistent with known diagnosis of bladder cancer.  Next, approximately 2 mL of indocyanine green dye were injected  in a submucosal fashion circumferentially around these areas.  The patient was then completely re-prepped using chlorhexidine gluconate from the xiphoid to the pubic ramus and Betadine below this, preparation for cystectomy.  Foley catheter was placed to straight drain.  A high-flow low-pressure pneumoperitoneum was obtained using Veress technique in the supraumbilical midline having passed the aspiration and drop test.  Next, a 12-mm robotic camera port was placed in  the same location.  Laparoscopic examination of the peritoneal cavity revealed no visceral injury or significant adhesions . He was noted to have large volume intra-abdominal fat.  Additional ports were then placed as follows: Right far lateral 8-mm robotic port 3 fingerbreadths superior to the anterior iliac spine, 8-mm right paramedian robotic port.  A right paramedian 15-mm assistant port previously marked conduit site, left far lateral 8-mm robotic port, primary left paramedian 8-mm robotic port in line with the umbilicus, and a left far lateral 12-mm assistant port.  Robot was docked and passed through electronic checks.  Next, attention was directed to identification of left ureter.  Incision was made lateral to the left medial umbilical ligament and then tracing inferiorly along the course of the iliac vessels towards the aortic bifurcation.  Pulsations of the common iliac artery were visualized and dissection proceeded directly on this.  The left ureter was identified.  This was very carefully mobilized, taking great care not to excessively skeletonized the ureter, approximately 4 cm proximal to an iliac crossing and then distally all the way to the ureterovesical junction.  At which point, there was ligated using extra large Hem-o-Lok clip distally and intact extra large Hem-o-Lok clip proximally.  A frozen section was sent and negative for carcinoma.  The left lateral attachments were taken down between the pelvic sidewall and the bladder and the pelvic fascia was incised and the prostate was swept laterally away from the left pelvic musculature gently on the right side.  The right ureter was identified. Careful ambulation of the ureterovesical junction was doubly clipped and ligated.  Frozen section was negative for carcinoma.  Right lateral attachments were taken down between the sidewall of the bladder. Endopelvic fascia was incised and the prostate was carefully  dissected away from the pelvic vasculature.  Attention was directed to the posterior dissection.  The two previous lateral peritoneal incisions were then connected posteriorly.  A flap of peritoneum was carefully swept posteriorly.  Dissection proceeded in this plane towards the trigone of the bladder and then towards the apex of the prostate taking great care to avoid rectal injury which did not occur thus exposing the bladder and prostate vascular pedicles.  Sequential ligation of the pedicles was performed again with the bladder towards the apex of the prostate using endovascular load stapler. Anterior attachments were then taken down.  Dorsal venous complex was controlled using vascular stapler.  This exposed the membranous urethra, which was coldly incised.  The foley catheter was purposely cut in two with extra large Hem-o-Lok clip placed on the specimen side.  This completely freed up the cystoprostatectomy specimen, which was closed to an extra large endoscopic retrieval bag and swept into the hypochondrium.  The urethral stump was oversewed using 3-0 V-Loc suture as was the point using of the bladder pedicles with excellent hemostasis.  Attention was the directed to the lymphadnectomy.  The entire pelvis was interrogated using the infrared fluorescent light and no hyperfluorescence of the lymph nodes were identified.  So, extended template dissection was performed first on the left side.  All fiber fatty tissue and the confines of the external iliac artery and vein were carefully mobilized from the bifurcation to the internal ring. Lymphostasis was achieved using Hem-o-Lok clips and this large fiber fatty packet was set aside as well as left external iliac lymph nodes.  Next fiber fatty tissue within the confines of the obturator nerve pelvic side wall and inferior border of the external iliac vein, was carefully mobilized.  Lmphostasis was achieved with Hem-o-Lok clips and set  aside and labeled the obturator lymph nodes.  Left obturator nerve was inspected and intact following the maneuvers.  Lymphadenctomy was performed on the right side and right obturator nerve was inspected and intact following all lymphangiectomy portions.  Next, the left ureter was brought behind the colon to the right side.  We carefully dissecting robotically the posterior peritoneum just above the aortic bifurcation. Attention was then directed at harvesting suitable bowel.  The patient had previously noted to have ileocecal junction very very high in his right upper quadrant subhepatic location by pre-opCT scan, this was verified and a segment of distal ileum approximately 30 cm proximal to the ileocecal junction was identified.  This was measured at 20 cm in length And taken out of continuity using bowel load stapler.  The end of the distal portion, two vascular loads were also used to further mobilize the mesentery.  This was performed after 2 mL Indocyanine green was given intravenously allowing for mesenteric angiography.  Mesenteric angiography revealed suitable preservation of arcades to the anastomotic and conduit segments and guided the endovascular stapler line.  A single load was placed on the proximal segment.  The conduit segment was laid retroperitoneally and proper orientation was marked with vessel loops. The bowel was brought back into continuity by performing side-to-side stapled bowel anastomosis with the 60-mm stapler x2.  These staple lines were buttressed using interrupted silk as was the acute angle and mesenteric defect.  The free ends of the staple line were oversewn using running silk followed by separate imbricating layer of running silk, which provide excellent mucosal apposition and imbrication.  The proximal end of the conduit was then carefully anchored at the right lower quadrant using silk suture to right pelvic peritoneum.  Attention was then directed  to the ureteroenteric anastomosis.  Suitable location on the proximal conduit was  incised for distance approximately 7 mm to the level of the bowel mucosa, which appeared to be appropriately vascularized.  The left ureter was then transected coldly and spatulated and anchor stitch was applied with 3-0 Monocryl bringing these areas the tension-free approximation.  The posterior wall anastomosis was performed using  3-0 Monocryl in running fashion.  Next, a 6 x 26 double-J stent was placed over the Glidewire robotically.  Copious efflux of urine was seen to the distal end of the stent, which was then carefully navigated into the proximal end of the conduit.  The anterior ureter and the bowel was then carefully anastomosed similarly with a separate anterior in a running suture line.  This appeared to be watertight.  Similarly, the right ureter was anastomosed to the proximal conduit and positioned approximately 4 cm distal to the left in exactly the same fashion, again employing a 6 x 26 stent, separate suture lines and a separate anchoring stitch.  Anastomosis was appeared to be tension free and watertight.  Pelvis was once again inspected.  Hemostasis appeared excellent.  Sponge and needle counts were correct.  The patient taken out of Trendelenburg position.  Specimen was retrieved by extending the previous camera port sites for total distance approximately 5 cm and removing the endoscopic retrieval bag in its entirety setting aside for permanent pathology.  The conduit was very carefully brought through the previous 15-mm robotic port, having been dilated using surgeon's fingers the suitable caliber, and anchored to the level of the abdominal fascia using 2-0 Vicryl x4 and then a rosebud type bowel-to-skin approximation was performed x4 with intervening interrupted 3-0 Vicryl, resulted in excellent skin-to-bowel approximation.  An 18-French red rubber catheter was carefully guided into  this for a distance approximately 15 cm and a stay suture was applied resulted in excellent efflux of clear appearing urine.  Drain was brought through the previous left paramedian robotic port site at the level of the pelvis.  Previous left far lateral 12-mm assistant port site was closed with fascia using figure-of-eight Vicryl.  The excess retrieval site was reapproximated using figure-of-eight PDS x5 followed by running Vicryl of deep dermis.  All skin incisions were reapproximated using subcuticular Monocryl followed by Dermabond. Ostomy appliance was applied.  Tension band was applied.  Procedure was then terminated.  The patient tolerated the procedure well.  There were no immediate periprocedural complications.  The patient was taken to the postanesthesia care unit in stable condition.          ______________________________ Sebastian Ache, MD     TM/MEDQ  D:  02/22/2012  T:  02/23/2012  Job:  [id number]

## 2012-02-23 NOTE — Consult Note (Signed)
WOC ostomy consult: Initial visit post op Stoma type/location: RUQ Stomal assessment/size: visualized through pouch, approximately 1 and 1/4 inches round, moist viable with red rubber catheter protruding Peristomal assessment: not seen Treatment options for stomal/peristomal skin: none indicated Output tea colored urine Ostomy pouching: 1pc.post op pouch intact (attached to bedside drainage bag)  Education provided: Patient and wife (and family) understand that WOC Nurse is post op resource  for stoma.  Many questions today, all appropriate.  Teaching session arranged for Sunday at 11am. I will follow along with you. Thanks, Ladona Mow, MSN, RN, [facility], CWOCN [phone])

## 2012-02-23 NOTE — Progress Notes (Signed)
Chad Olson ambulated to the commode and back, a distance of 20'.  He prefers not to be ambulated again, had significant pain with getting up and down that many times.

## 2012-02-23 NOTE — Progress Notes (Signed)
1 Day Post-Op  Subjective:  1 - Recurrent High-Grade Bladder Cancer - s/p robotic cystoprostatectomy with bilateral lymphadenectomy and intracorporeal ileal conduit urinary diversion on 02/22/12 after admit day prior for bowel prep. Stepdown overnight 2/20 for monitoring.   Today Chad Olson is w/o complaints. No events overnight. UOP excellent, Hgb preserved. Pain controlled on PCA. Not yet out of bed.  Objective: Vital signs in last 24 hours: Temp:  [97.3 F (36.3 C)-98.4 F (36.9 C)] 97.9 F (36.6 C) (02/21 0400) Pulse Rate:  [80-107] 80 (02/21 0500) Resp:  [13-23] 15 (02/21 0500) BP: (118-150)/(66-95) 128/66 mmHg (02/21 0500) SpO2:  [94 %-100 %] 100 % (02/21 0500) Weight:  [123.5 kg (272 lb 4.3 oz)] 123.5 kg (272 lb 4.3 oz) (02/20 1829) Last BM Date: 02/21/12  Intake/Output from previous day: 02/20 0701 - 02/21 0700 In: 5475 [I.V.:4175; IV Piggyback:1300] Out: 1920 [Urine:1240; Drains:280; Blood:400] Intake/Output this shift: Total I/O In: 1200 [I.V.:900; IV Piggyback:300] Out: 1250 [Urine:1090; Drains:160]  General appearance: alert, cooperative and appears stated age Head: Normocephalic, without obvious abnormality, atraumatic Eyes: conjunctivae/corneas clear. PERRL, EOM's intact. Fundi benign. Ears: normal TM's and external ear canals both ears Nose: Nares normal. Septum midline. Mucosa normal. No drainage or sinus tenderness. Throat: lips, mucosa, and tongue normal; teeth and gums normal Neck: no adenopathy, no carotid bruit, no JVD, supple, symmetrical, trachea midline and thyroid not enlarged, symmetric, no tenderness/mass/nodules Back: symmetric, no curvature. ROM normal. No CVA tenderness. Resp: clear to auscultation bilaterally Chest wall: no tenderness Cardio: regular rate and rhythm, S1, S2 normal, no murmur, click, rub or gallop GI: soft, non-tender; bowel sounds normal; no masses,  no organomegaly Male genitalia: normal, No penile drainage Extremities:  extremities normal, atraumatic, no cyanosis or edema Pulses: 2+ and symmetric Skin: Skin color, texture, turgor normal. No rashes or lesions Lymph nodes: Cervical, supraclavicular, and axillary nodes normal. Neurologic: Grossly normal Incision/Wound: urostomy ;pink and patent with copious light pink urine. JP with serous efflux. Recent port sites and extraction sites c/d/i wihtoyut erythema or drainage.   Lab Results:   Recent Labs  02/21/12 1016 02/22/12 1630 02/23/12 0330  WBC 6.0  --   --   HGB 16.7 17.0 15.1  HCT 49.5 49.7 44.9  PLT 175  --   --    BMET  Recent Labs  02/22/12 1630 02/23/12 0330  NA 138 136  K 4.2 4.0  CL 101 105  CO2 21 23  GLUCOSE 197* 175*  BUN 14 14  CREATININE 1.27 1.00  CALCIUM 8.7 7.9*   PT/INR No results found for this basename: LABPROT, INR,  in the last 72 hours ABG No results found for this basename: PHART, PCO2, PO2, HCO3,  in the last 72 hours  Studies/Results: No results found.  Anti-infectives: Anti-infectives   Start     Dose/Rate Route Frequency Ordered Stop   02/22/12 0700  [MAR Hold]  piperacillin-tazobactam (ZOSYN) IVPB 3.375 g     (On MAR Hold since 02/22/12 0730)   3.375 g 100 mL/hr over 30 Minutes Intravenous  Once 02/22/12 0657 02/22/12 0756   02/21/12 1000  piperacillin-tazobactam (ZOSYN) IVPB 3.375 g     3.375 g 100 mL/hr over 30 Minutes Intravenous  Once 02/21/12 0850 02/21/12 1242      Assessment/Plan:  1 - Recurrent High-Grade Bladder Cancer - Doing well POD1. Transfer to floor. Continue PO and PR bowel regimen. NPO until POD 3 or regular flatus. PT eval and treat.  [facility], Kashawn Dirr 02/23/2012

## 2012-02-23 NOTE — Progress Notes (Signed)
PT Cancellation Note  Patient Details Name: Chad Olson MRN: [id number] DOB: [birth year]   Cancelled Treatment:    Reason Eval/Treat Not Completed: Pain limiting ability to participate.  Pt and RN state he ambulated to/from restroom earlier and pt states that he is in too much pain at this time to participate in PT.  Will check back tomorrow.    Thanks,    Vista Deck 02/23/2012, 1:41 PM

## 2012-02-24 LAB — BASIC METABOLIC PANEL
GFR calc Af Amer: >90 mL/min (ref >90)
GFR calc non Af Amer: 87 mL/min — ABNORMAL LOW (ref >90)
Glucose, Bld: 161 mg/dL — ABNORMAL HIGH (ref 70–99)
Potassium: 4 mEq/L (ref 3.5–5.1)
Sodium: 137 mEq/L (ref 135–145)

## 2012-02-24 LAB — HEMOGLOBIN AND HEMATOCRIT, BLOOD
HCT: 45.1 % (ref 39.0–52.0)
Hemoglobin: 15 g/dL (ref 13.0–17.0)

## 2012-02-24 NOTE — Progress Notes (Signed)
Called Dr. Mena Goes to notify of QTC of .48, it has been trending upward over past 36 hours. Pt is asymptomatic and VS are WNL. I asked Central Monitoring to run another strip; QTC was .34 at 2221. Dr. Mena Goes ordered Mg, BMET to r/o any electrolyte deficiencies. Will continue to monitor pt.

## 2012-02-24 NOTE — Plan of Care (Signed)
Problem: Phase II Progression Outcomes Goal: Progress activity as tolerated unless otherwise ordered Outcome: Not Progressing Pt unable to ambulate today due to increased pain and nausea, however did get up to recliner.

## 2012-02-24 NOTE — Progress Notes (Signed)
Patient up in the chair X2 and ambulated in the hall about 20ft with a walker needing moderate assistance from 1-2person. Continue to encourage I/S use.

## 2012-02-24 NOTE — Evaluation (Signed)
Physical Therapy Evaluation Patient Details Name: Chad Olson MRN: [id number] DOB: 1945/07/15 Today's Date: 02/24/2012 Time: 7829-5621 PT Time Calculation (min): 16 min  PT Assessment / Plan / Recommendation Clinical Impression   Pt admitted with recurrent high-grade bladder cancer - s/p robotic cystoprostatectomy with bilateral lymphadenectomy and intracorporeal ileal conduit urinary diversion on 02/22/12.   Pt would benefit from acute PT services in order to improve independence with transfers and ambulation to prepare for d/c.  Currently recommend SNF however pt may progress to HHPT once pain better.    PT Assessment  Patient needs continued PT services    Follow Up Recommendations  SNF    Does the patient have the potential to tolerate intense rehabilitation      Barriers to Discharge        Equipment Recommendations  None recommended by PT    Recommendations for Other Services     Frequency Min 3X/week    Precautions / Restrictions Precautions Precautions: Fall Restrictions Weight Bearing Restrictions: No   Pertinent Vitals/Pain Limited mobility due to pain and nausea, encouraged PCA, RN aware of nausea      Mobility  Bed Mobility Bed Mobility: Supine to Sit Supine to Sit: 3: Mod assist;HOB elevated Details for Bed Mobility Assistance: assist for trunk upright Transfers Transfers: Sit to Stand;Stand to Sit;Stand Pivot Transfers Sit to Stand: 4: Min assist;With upper extremity assist Stand to Sit: 4: Min assist;With upper extremity assist Stand Pivot Transfers: 4: Min assist Details for Transfer Assistance: +2 for safety, multiple lines/oxygen, verbal cues for safe technique, pt in too much pain and nauseated upon standing to ambulate so only transferred to recliner, used RW for transfer, HR 130 during transfer - likely elevated due to pain Ambulation/Gait Ambulation/Gait Assistance: Not tested (comment)    Exercises     PT Diagnosis: Difficulty  walking;Acute pain  PT Problem List: Decreased strength;Decreased activity tolerance;Decreased mobility;Pain;Decreased knowledge of use of DME PT Treatment Interventions: DME instruction;Gait training;Stair training;Functional mobility training;Therapeutic activities;Therapeutic exercise;Patient/family education   PT Goals Acute Rehab PT Goals PT Goal Formulation: With patient Time For Goal Achievement: 03/02/12 Potential to Achieve Goals: Good Pt will go Supine/Side to Sit: with supervision PT Goal: Supine/Side to Sit - Progress: Goal set today Pt will go Sit to Supine/Side: with supervision PT Goal: Sit to Supine/Side - Progress: Goal set today Pt will go Sit to Stand: with supervision PT Goal: Sit to Stand - Progress: Goal set today Pt will go Stand to Sit: with supervision PT Goal: Stand to Sit - Progress: Goal set today Pt will Ambulate: with supervision;51 - 150 feet;with least restrictive assistive device PT Goal: Ambulate - Progress: Goal set today Pt will Perform Home Exercise Program: with supervision, verbal cues required/provided PT Goal: Perform Home Exercise Program - Progress: Goal set today  Visit Information  Last PT Received On: 02/24/12 Assistance Needed: +2    Subjective Data  Subjective: I'm nauseated.   Prior Functioning  Home Living Lives With: Spouse;Daughter Available Help at Discharge: Family Type of Home: House Home Access: Stairs to enter Secretary/administrator of Steps: 4-5 Entrance Stairs-Rails: None Home Layout: One level Home Adaptive Equipment: Walker - rolling Prior Function Level of Independence: Independent Communication Communication: No difficulties    Cognition  Cognition Overall Cognitive Status: Appears within functional limits for tasks assessed/performed Arousal/Alertness: Awake/alert Orientation Level: Appears intact for tasks assessed Behavior During Session: [facility] for tasks performed    Extremity/Trunk Assessment Right  Lower Extremity Assessment RLE ROM/Strength/Tone: [facility] for  tasks assessed Left Lower Extremity Assessment LLE ROM/Strength/Tone: [facility] for tasks assessed   Balance    End of Session PT - End of Session Activity Tolerance: Patient limited by pain;Other (comment) (nausea) Patient left: in chair;with call bell/phone within reach;with family/visitor present  GP     Aronda Burford,KATHrine E 02/24/2012, 1:52 PM Zenovia Jarred, PT, DPT 02/24/2012 Pager: [phone]

## 2012-02-24 NOTE — Progress Notes (Signed)
Patient ID: Chad Olson, male   DOB: [birth year], 67 y.o.   MRN: [id number]  Pt has had some burping and nausea. No emesis. No flatus. Only sat in chair once today.   BP 120/72  Pulse 89  Temp(Src) 98.5 F (36.9 C) (Oral)  Resp 17  Ht 5\' 11"  (1.803 m)  Wt 123.5 kg (272 lb 4.3 oz)  BMI 37.99 kg/m2  SpO2 95%  Intake/Output Summary (Last 24 hours) at 02/24/12 1512 Last data filed at 02/24/12 1400  Gross per 24 hour  Intake      0 ml  Output   1895 ml  Net  -1895 ml  JP output has slowed down.  PE: NAD A&Ox3 CV - RRR, no LE edema Lungs - nl rate and effort Abd - incision C/D/I, soft, mild distention, no R/G, ostomy pink/viable     Hemoglobin & Hematocrit     Component Value Date/Time   HGB 15.0 02/24/2012 0548   HGB 18.3* 10/12/2011 0947   HCT 45.1 02/24/2012 0548   HCT 54.2* 10/12/2011 0947    BMET    Component Value Date/Time   NA 137 02/24/2012 0548   NA 143 10/12/2011 0947   K 4.0 02/24/2012 0548   K 4.2 10/12/2011 0947   CL 105 02/24/2012 0548   CL 107 10/12/2011 0947   CO2 24 02/24/2012 0548   CO2 23 10/12/2011 0947   GLUCOSE 161* 02/24/2012 0548   GLUCOSE 167* 10/12/2011 0947   BUN 11 02/24/2012 0548   BUN 17.0 10/12/2011 0947   CREATININE 0.90 02/24/2012 0548   CREATININE 1.0 10/12/2011 0947   CALCIUM 8.2* 02/24/2012 0548   CALCIUM 9.3 10/12/2011 0947   GFRNONAA 87* 02/24/2012 0548   GFRAA >90 02/24/2012 0548   Imp - POD#2 rad CP with IC - stable.  Plan - -encouraged IS, ambulation -await bowel fxn

## 2012-02-25 LAB — HEMOGLOBIN AND HEMATOCRIT, BLOOD: Hemoglobin: 14.1 g/dL (ref 13.0–17.0)

## 2012-02-25 LAB — BASIC METABOLIC PANEL
BUN: 12 mg/dL (ref 6–23)
Calcium: 8.4 mg/dL (ref 8.4–10.5)
Creatinine, Ser: 0.93 mg/dL (ref 0.50–1.35)
GFR calc Af Amer: >90 mL/min (ref >90)
GFR calc non Af Amer: 86 mL/min — ABNORMAL LOW (ref >90)

## 2012-02-25 MED ORDER — PANTOPRAZOLE SODIUM 40 MG IV SOLR
40.0000 mg | Freq: Every day | INTRAVENOUS | Status: DC
  Administered 2012-02-25 – 2012-02-26 (×2): 40 mg via INTRAVENOUS
  Filled 2012-02-25 (×4): qty 40

## 2012-02-25 NOTE — Progress Notes (Addendum)
Patient ID: Chad Olson, male   DOB: 28-Sep-1945, 67 y.o.   MRN: [id number]   Pt complains of bloating, stomach cramps. No CP or SOB. Still a bit slow to ambulate -- see PT note. No flatus. Some nausea and small volume mucousy emesis earlier this AM.   See nurse note - lytes normal and QTc returned to normal.   BP 119/78  Pulse 89  Temp(Src) 98 F (36.7 C) (Oral)  Resp 14  Ht 5\' 11"  (1.803 m)  Wt 123.5 kg (272 lb 4.3 oz)  BMI 37.99 kg/m2  SpO2 96%   Intake/Output Summary (Last 24 hours) at 02/25/12 1032 Last data filed at 02/25/12 0610  Gross per 24 hour  Intake 1180.33 ml  Output   2362 ml  Net -1181.67 ml  JP slowing  NAD A&Ox3 CV - RRR, nl S1, S2 Lungs - CTAB Abd - incision C.D.I, ostomy viable; soft; +BS but slow, uncoordinated Ext - no CCE  Hemoglobin & Hematocrit     Component Value Date/Time   HGB 14.1 02/25/2012 0530   HGB 18.3* 10/12/2011 0947   HCT 41.9 02/25/2012 0530   HCT 54.2* 10/12/2011 0947   BMET    Component Value Date/Time   NA 135 02/24/2012 2345   NA 143 10/12/2011 0947   K 3.8 02/24/2012 2345   K 4.2 10/12/2011 0947   CL 101 02/24/2012 2345   CL 107 10/12/2011 0947   CO2 26 02/24/2012 2345   CO2 23 10/12/2011 0947   GLUCOSE 141* 02/24/2012 2345   GLUCOSE 167* 10/12/2011 0947   BUN 12 02/24/2012 2345   BUN 17.0 10/12/2011 0947   CREATININE 0.93 02/24/2012 2345   CREATININE 1.0 10/12/2011 0947   CALCIUM 8.4 02/24/2012 2345   CALCIUM 9.3 10/12/2011 0947   GFRNONAA 86* 02/24/2012 2345   GFRAA >90 02/24/2012 2345   Mg 2.0   Imp -  POD#3 Rad CP with IC Ileus - he got another dulcolax PR  Plan -  -again encouraged patient to sit in chair, ambulate as much as possible -IS -I'm a bit hesitant to start clears given his nausea, slight emesis and supine status.

## 2012-02-25 NOTE — Consult Note (Signed)
WOC ostomy consult  Stoma type/location: RUQ  Urostomy (ileal conduit) Stomal assessment/size: oval stoma, 1 inch x 1 and 5/8. Slightly budded, red rubber catheter protruding from center and held in place with sutures. Noted upon inspection of previous skin barrier that pouch would have leaked imminently. Peristomal assessment: intact, clear. Treatment options for stomal/peristomal skin: None indicated at this time Output:  tea colored urine, sufficient Ostomy pouching: 1pc. Pouch with convexity . Hart [place] [phone] Education provided: Lengthy session with patient (who is still nauseated), wife, sister and daughter today that included pouch characteristics (anti-reflux feature, tap valve, adapter, optional "cap", cloth backing) , stoma characteristics (moist, red, mild edema) and discussion of application.  Demonstrated removal and application of new pouch. Patient attached pouch to bedside drainbag using adapter. Cleaning of bedside drainbag with water daily and rinsing with 1/1 vinegar/water once weekly to decrease odor and disinfect bag discussed. Will need instruction regarding dry powder shaving and role of Secure Start in future visits, also practice with pouching system removal and application. I have asked Nursing to de-tatch from bedside drainbag when OOB and allow patient to experience what pouching filling feels like. Thanks, Ladona Mow, MSN, RN, [facility], CWOCN [phone])

## 2012-02-25 NOTE — Progress Notes (Addendum)
Patient had episode of mucousy type emesis approximately 2 mins after c/o indigestion. Gave Zofran iv. C/O slight SHOB after vomiting. Obtained set of vitals; BP126/72 HR 89 Temp 99.2 SpO2 95% RR 13. Stayed w/pt in room until he fell asleep. Will continue to monitor pt.

## 2012-02-25 NOTE — Progress Notes (Signed)
Patient ID: Chad Olson, male   DOB: 12-May-1945, 67 y.o.   MRN: [id number]   Pt has h/o GERD. I added Protonix IV daily. Hopefully this will help settle his stomach.

## 2012-02-26 LAB — BASIC METABOLIC PANEL
BUN: 12 mg/dL (ref 6–23)
CO2: 24 mEq/L (ref 19–32)
Chloride: 105 mEq/L (ref 96–112)
Creatinine, Ser: 0.86 mg/dL (ref 0.50–1.35)

## 2012-02-26 LAB — MAGNESIUM: Magnesium: 2 mg/dL (ref 1.5–2.5)

## 2012-02-26 LAB — CREATININE, FLUID (PLEURAL, PERITONEAL, JP DRAINAGE)

## 2012-02-26 MED ORDER — OXYCODONE-ACETAMINOPHEN 5-325 MG PO TABS
1.0000 | ORAL_TABLET | Freq: Four times a day (QID) | ORAL | Status: DC | PRN
Start: 2012-02-26 — End: 2012-02-28
  Administered 2012-02-26: 2 via ORAL
  Administered 2012-02-27 (×3): 1 via ORAL
  Filled 2012-02-26 (×2): qty 1
  Filled 2012-02-26: qty 2
  Filled 2012-02-26: qty 1

## 2012-02-26 MED ORDER — HYDROMORPHONE HCL PF 1 MG/ML IJ SOLN: 0.5000 mg | INTRAMUSCULAR | Status: DC | PRN

## 2012-02-26 NOTE — Progress Notes (Signed)
4 Days Post-Op  Subjective:  1 - Recurrent High-Grade Bladder Cancer - s/p robotic cystoprostatectomy with bilateral lymphadenectomy and intracorporeal ileal conduit urinary diversion on 02/22/12 after admit day prior for bowel prep. Stepdown overnight 2/20 for monitoring. Beginning clears 2/24as now with vigorous flatus.  2 - Rehabilitation -  Initial PT eval with possible HHPT v. SNF.  Today Chad Olson is w/o complaints. His activity level is increasing and his pain is controlled. He had vigorous flatus overnight with resolution of some nausea. Path Pending.   Objective: Vital signs in last 24 hours: Temp:  [97.8 F (36.6 C)-98 F (36.7 C)] 97.9 F (36.6 C) (02/24 0558) Pulse Rate:  [75-96] 75 (02/24 0558) Resp:  [12-20] 18 (02/24 0558) BP: (118-131)/(68-75) 131/75 mmHg (02/24 0558) SpO2:  [9 %-98 %] 98 % (02/24 0558) FiO2 (%):  [94 %] 94 % (02/23 1200) Last BM Date: 02/21/12  Intake/Output from previous day: 02/23 0701 - 02/24 0700 In: 1350 [I.V.:1350] Out: 2340 [Urine:2000; Drains:340] Intake/Output this shift:    General appearance: alert, cooperative and appears stated age Head: Normocephalic, without obvious abnormality, atraumatic Eyes: conjunctivae/corneas clear. PERRL, EOM's intact. Fundi benign. Ears: normal TM's and external ear canals both ears Nose: Nares normal. Septum midline. Mucosa normal. No drainage or sinus tenderness. Throat: lips, mucosa, and tongue normal; teeth and gums normal Neck: no adenopathy, no carotid bruit, no JVD, supple, symmetrical, trachea midline and thyroid not enlarged, symmetric, no tenderness/mass/nodules Back: symmetric, no curvature. ROM normal. No CVA tenderness. Resp: clear to auscultation bilaterally and normal percussion bilaterally Chest wall: no tenderness Cardio: regular rate and rhythm, S1, S2 normal, no murmur, click, rub or gallop GI: soft, non-tender; bowel sounds normal; no masses,  no organomegaly Male genitalia:  normal Extremities: extremities normal, atraumatic, no cyanosis or edema Pulses: 2+ and symmetric Skin: Skin color, texture, turgor normal. No rashes or lesions Lymph nodes: Cervical, supraclavicular, and axillary nodes normal. Neurologic: Grossly normal Incision/Wound: Urostomy pink and patent with clear urine. All other port and extraction sites c/d/i. JP with serous pink fluid.   Lab Results:   Recent Labs  02/25/12 0530 02/26/12 0445  HGB 14.1 13.0  HCT 41.9 39.5   BMET  Recent Labs  02/24/12 2345 02/26/12 0445  NA 135 136  K 3.8 3.7  CL 101 105  CO2 26 24  GLUCOSE 141* 145*  BUN 12 12  CREATININE 0.93 0.86  CALCIUM 8.4 8.2*   PT/INR No results found for this basename: LABPROT, INR,  in the last 72 hours ABG No results found for this basename: PHART, PCO2, PO2, HCO3,  in the last 72 hours  Studies/Results: No results found.  Anti-infectives: Anti-infectives   Start     Dose/Rate Route Frequency Ordered Stop   02/22/12 0700  [MAR Hold]  piperacillin-tazobactam (ZOSYN) IVPB 3.375 g     (On MAR Hold since 02/22/12 0730)   3.375 g 100 mL/hr over 30 Minutes Intravenous  Once 02/22/12 0657 02/22/12 0756   02/21/12 1000  piperacillin-tazobactam (ZOSYN) IVPB 3.375 g     3.375 g 100 mL/hr over 30 Minutes Intravenous  Once 02/21/12 0850 02/21/12 1242      Assessment/Plan:  1 - Recurrent High-Grade Bladder Cancer -Doing well clinically. Begin clears today and check JP Cr. Path pending.  2 - Rehabilitation - Appreciate PT help with return to ambulation and DC planning.  Chad Olson, Chad Olson 02/26/2012

## 2012-02-26 NOTE — Progress Notes (Signed)
Physical Therapy Treatment Patient Details Name: Chad Olson MRN: [id number] DOB: June 14, 1945 Today's Date: 02/26/2012 Time: 4098-1191 PT Time Calculation (min): 28 min  PT Assessment / Plan / Recommendation Comments on Treatment Session  POD # 4 Robotiassisted prostalectomy.  Pt would prefer to D/C to home vs SNF and states he will have family present to assist.  Pt highly motivated and has even been amb with nursing staff several times a day to "get better".    Follow Up Recommendations  Home health PT;Other (comment) (Pt wants to go home)     Does the patient have the potential to tolerate intense rehabilitation     Barriers to Discharge        Equipment Recommendations  None recommended by PT (has 3 RW's)    Recommendations for Other Services    Frequency Min 3X/week   Plan      Precautions / Restrictions     Pertinent Vitals/Pain C/o mild ABD discomfort/digestion    Mobility  Bed Mobility Bed Mobility: Not assessed Details for Bed Mobility Assistance: Pt OOB in recliner Transfers Transfers: Sit to Stand;Stand to Sit Sit to Stand: 4: Min assist;From chair/3-in-1 Stand to Sit: 4: Min assist;To chair/3-in-1 Details for Transfer Assistance: 25% vc'S on proper tech and increased time.  No c/o pain just ABD discomfort "just eaten". 50% VC's on proper walker to self distance as pt tened to push walker too far to front.  Ambulation/Gait Ambulation/Gait Assistance: 4: Min guard Ambulation Distance (Feet): 85 Feet Assistive device: Rolling walker Ambulation/Gait Assistance Details: amb on RA sats avg 95%. Pt feeling better and amb with nursing a couple times a day.  Gait Pattern: Step-through pattern;Trunk flexed Gait velocity: decreased    PT Goals    Visit Information  Last PT Received On: 02/26/12 Assistance Needed: +2    Subjective Data      Cognition    good   Balance   good  End of Session PT - End of Session Equipment Utilized During Treatment: Gait  belt Activity Tolerance: Patient tolerated treatment well Patient left: in chair;with family/visitor present   [name]  PTA [facility]  Acute  Rehab Pager      [phone]

## 2012-02-26 NOTE — Progress Notes (Signed)
Patient ambulated with staff 3 times today during day shift,  no C/O pain, not using PCA pain medication as much, started on clear liquid diet and tolerated it well, had a bowel movement as well. PCA D/C'd and started on PRN pain medication, Dr. Berneice Heinrich notified. Continue to F/U with plan of care.

## 2012-02-27 MED ORDER — PANTOPRAZOLE SODIUM 40 MG PO TBEC
40.0000 mg | DELAYED_RELEASE_TABLET | Freq: Every day | ORAL | Status: DC
  Administered 2012-02-27 – 2012-02-28 (×2): 40 mg via ORAL
  Filled 2012-02-27 (×2): qty 1

## 2012-02-27 NOTE — Progress Notes (Signed)
This patient is receiving IV Protonix. Based on criteria approved by the Pharmacy and Therapeutics Committee, this medication is being converted to the equivalent oral dose form. These criteria include:   . The patient is eating (either orally or per tube) and/or has been taking other orally administered medications for at least 24 hours.  . This patient has no evidence of active gastrointestinal bleeding or impaired GI absorption (gastrectomy, short bowel, patient on TNA or NPO).   If you have questions about this conversion, please contact the pharmacy department.  Otho Bellows, PHARMD 02/27/2012 7:55 AM

## 2012-02-27 NOTE — Consult Note (Signed)
WOC ostomy consult  Stoma type/location: Urostomy stoma to right quad Stomal assessment/size: Stoma red and viable, flush with skin level,1 5/8 inches.  Red rubber catheter sutured to middle of stoma. Peristomal assessment: Intact skin surrounding Output Large amt yellow urine with mucous Ostomy pouching: 1pc convex urostomy pouch.  Education provided: Pt assisted with pouch application.  Able to open and close spout to empty, and connect and remove bedside drainage bag.  Discussed pouching routines and ordering supplies.  Placed on [place] discharge program.  Wife at bedside participated in pouch application.  Applied one piece convex appliance.  Denies further questions at this time.  Supplies in room for patient or bedside nurse use.  Cammie Mcgee, RN, MSN, [employer]  [phone]

## 2012-02-27 NOTE — Progress Notes (Signed)
Physical Therapy Treatment Patient Details Name: Chad Olson MRN: [id number] DOB: 12-19-1945 Today's Date: 02/27/2012 Time: 8119-1478 PT Time Calculation (min): 13 min  PT Assessment / Plan / Recommendation Comments on Treatment Session  Pt doing much better with mobility and reports ambulating with family and staff.  Pt very pleasant and cooperative and plans to d/c home.    Follow Up Recommendations  Home health PT     Does the patient have the potential to tolerate intense rehabilitation     Barriers to Discharge        Equipment Recommendations  None recommended by PT    Recommendations for Other Services    Frequency     Plan Discharge plan remains appropriate;Frequency remains appropriate    Precautions / Restrictions Precautions Precautions: None Restrictions Weight Bearing Restrictions: No   Pertinent Vitals/Pain Pt reports abdominal pain upon standing however decreased once pt started ambulating.    Mobility  Bed Mobility Bed Mobility: Supine to Sit Supine to Sit: HOB elevated;4: Min guard Details for Bed Mobility Assistance: pt given hand to pull up trunk along with using rail Transfers Transfers: Stand to Sit;Sit to Stand Sit to Stand: With upper extremity assist;From bed Stand to Sit: To chair/3-in-1;4: Min guard Details for Transfer Assistance: pt's brother gave his hand to assist with pulling up due to pt reporting abdominal pain upon standing, verbal cues for safe technique Ambulation/Gait Ambulation/Gait Assistance: 4: Min guard Ambulation Distance (Feet): 360 Feet Assistive device: Rolling walker Ambulation/Gait Assistance Details: verbal cues for posture and staying closer to RW Gait Pattern: Step-through pattern;Trunk flexed Gait velocity: decreased    Exercises     PT Diagnosis:    PT Problem List:   PT Treatment Interventions:     PT Goals Acute Rehab PT Goals PT Goal: Supine/Side to Sit - Progress: Progressing toward goal PT Goal:  Sit to Stand - Progress: Progressing toward goal PT Goal: Stand to Sit - Progress: Progressing toward goal PT Goal: Ambulate - Progress: Progressing toward goal  Visit Information  Last PT Received On: 02/27/12 Assistance Needed: +1    Subjective Data  Subjective: It's pretty out there (looking out hall window)   Cognition  Cognition Overall Cognitive Status: Appears within functional limits for tasks assessed/performed Arousal/Alertness: Awake/alert Orientation Level: Appears intact for tasks assessed Behavior During Session: [facility] for tasks performed    Balance     End of Session PT - End of Session Activity Tolerance: Patient tolerated treatment well Patient left: in chair;with family/visitor present   GP     Alexandrina Fiorini,KATHrine E 02/27/2012, 3:03 PM Zenovia Jarred, PT, DPT 02/27/2012 Pager: [phone]

## 2012-02-27 NOTE — Progress Notes (Signed)
5 Days Post-Op  Subjective: 1 - Recurrent High-Grade Bladder Cancer - s/p robotic cystoprostatectomy with bilateral lymphadenectomy and intracorporeal ileal conduit urinary diversion on 02/22/12 after admit day prior for bowel prep. Stepdown overnight 2/20 for monitoring. Beginning clears 2/24as  vigorous flatus and regular diet 2/25 as +BM's. JP removed 2/24 as output decreasing and Cr same as serum. Final Path [id number] high grade bladder cancer with negative margins.   2 - Rehabilitation - Initial PT eval with possible HHPT v. SNF, now favoring HH for PT and for new urostomy teaching / supplies.   Today Chad Olson is w/o complaints. He had large formed BM yesterday and tolerated clears, his ambulation tolerance in increasing rapidly.   Objective: Vital signs in last 24 hours: Temp:  [97.9 F (36.6 C)-99.5 F (37.5 C)] 99.5 F (37.5 C) (02/25 0515) Pulse Rate:  [73-75] 73 (02/25 0515) Resp:  [10-19] 19 (02/25 0515) BP: (106-118)/(64-71) 118/69 mmHg (02/25 0515) SpO2:  [94 %-96 %] 95 % (02/25 0515) Last BM Date: 02/26/12  Intake/Output from previous day: 02/24 0701 - 02/25 0700 In: 1289.2 [I.V.:1289.2] Out: 2331 [Urine:2100; Drains:230; Stool:1] Intake/Output this shift:    General appearance: alert, cooperative and appears stated age Head: Normocephalic, without obvious abnormality, atraumatic Eyes: conjunctivae/corneas clear. PERRL, EOM's intact. Fundi benign. Ears: normal TM's and external ear canals both ears Nose: Nares normal. Septum midline. Mucosa normal. No drainage or sinus tenderness. Throat: lips, mucosa, and tongue normal; teeth and gums normal Neck: no adenopathy, no carotid bruit, no JVD, supple, symmetrical, trachea midline and thyroid not enlarged, symmetric, no tenderness/mass/nodules Back: symmetric, no curvature. ROM normal. No CVA tenderness. Resp: clear to auscultation bilaterally Chest wall: no tenderness Cardio: regular rate and rhythm, S1, S2 normal, no  murmur, click, rub or gallop GI: soft, non-tender; bowel sounds normal; no masses,  no organomegaly Male genitalia: normal Extremities: extremities normal, atraumatic, no cyanosis or edema Pulses: 2+ and symmetric Skin: Skin color, texture, turgor normal. No rashes or lesions Lymph nodes: Cervical, supraclavicular, and axillary nodes normal. Neurologic: Grossly normal Incision/Wound: Recent port sites and extraction sites c/d/i. Urostomy pink / patent with clear urine and red rubber cathter in place.  Lab Results:   Recent Labs  02/25/12 0530 02/26/12 0445  HGB 14.1 13.0  HCT 41.9 39.5   BMET  Recent Labs  02/24/12 2345 02/26/12 0445  NA 135 136  K 3.8 3.7  CL 101 105  CO2 26 24  GLUCOSE 141* 145*  BUN 12 12  CREATININE 0.93 0.86  CALCIUM 8.4 8.2*   PT/INR No results found for this basename: LABPROT, INR,  in the last 72 hours ABG No results found for this basename: PHART, PCO2, PO2, HCO3,  in the last 72 hours  Studies/Results: No results found.  Anti-infectives: Anti-infectives   Start     Dose/Rate Route Frequency Ordered Stop   02/22/12 0700  [MAR Hold]  piperacillin-tazobactam (ZOSYN) IVPB 3.375 g     (On MAR Hold since 02/22/12 0730)   3.375 g 100 mL/hr over 30 Minutes Intravenous  Once 02/22/12 0657 02/22/12 0756   02/21/12 1000  piperacillin-tazobactam (ZOSYN) IVPB 3.375 g     3.375 g 100 mL/hr over 30 Minutes Intravenous  Once 02/21/12 0850 02/21/12 1242      Assessment/Plan: 1 - Recurrent High-Grade Bladder Cancer - Recovering well. Path discussed and very favorable. Adv to reg diet today, continue ambulation.  2 - Rehabilitation / Dispo- Pan for DC tomorrow with HH. Face to Face completed. Please  call with any issues arranging this.  [facility], Senan Urey 02/27/2012

## 2012-02-28 MED ORDER — OXYCODONE-ACETAMINOPHEN 5-325 MG PO TABS: 1.0000 | ORAL_TABLET | Freq: Four times a day (QID) | ORAL | Status: DC | PRN

## 2012-02-28 MED ORDER — SENNA-DOCUSATE SODIUM 8.6-50 MG PO TABS: 1.0000 | ORAL_TABLET | Freq: Two times a day (BID) | ORAL | Status: DC

## 2012-02-28 NOTE — Consult Note (Signed)
WOC ostomy follow up Stoma type/location: IC R quadrant Ostomy pouching: 1pc convex pouch placed yesterday during educational session with pt and his wife. Education provided:  Reviewed pouching frequency, disconnect and reconnect to BSD.  Use of transparent pouch with spigot, and opening and closing of the spigot.  Emptying during the day and connection at night.  Use of wick with pouch change.  Change in urine, and appearance of mucous in the pouch with IC vs s/s of infection.  Provided contact info. For WOC team.   Management consultant Secure start program and role of HHRN.  Answered all questions from sister and patient. Provided with [employer] catelog with his current pouching system marked for them. Explained need to order from [employer] with his BC[employer] insurance to establish call to them once home.   Tran Arzuaga [place] RN,CWOCN [phone]

## 2012-02-28 NOTE — Discharge Summary (Signed)
Physician Discharge Summary  Patient ID: Chad Olson MRN: [id number] DOB/AGE: 1945-11-10 67 y.o.  Admit date: 02/21/2012 Discharge date: 02/28/2012  Admission Diagnoses: Recurrent High-Grade Bladder Cancer  Discharge Diagnoses: Recurrent High-Grade Bladder Cancer  Discharged Condition: good  Hospital Course:   1 - Recurrent High-Grade Bladder Cancer - s/p robotic cystoprostatectomy with bilateral lymphadenectomy and intracorporeal ileal conduit urinary diversion on 02/22/12 after admit day prior for bowel prep. 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 Stepdown overnight 2/20 for monitoring. Beginning clears 2/24 as vigorous flatus and regular diet 2/25 as +BM's. JP removed 2/24 as output decreasing and Cr same as serum. Final Path TcisN0Mx high grade bladder cancer with negative margins. By time of discharge he was ambulating, tolerating regular diet, had completely resumed bowel function, stable renal function and felt to be adequate for discharge.  2 - Rehabilitation - PT and wound ostomy consultations recommend home health for [facility] PT and continued ostomy teaching and supplies. This has been arranged.   Consults: PT, Wound Ostomy  Significant Diagnostic Studies: labs: Cr <1.5. Final Surgical pathology: TcisN0Mx high grade bladder cancer with negative margins  Treatments: surgery: robotic cystoprostatectomy with bilateral lymphadenectomy and intracorporeal ileal conduit urinary diversion on 02/22/12   Discharge Exam: Blood pressure 117/72, pulse 76, temperature 98.6 F (37 C), temperature source Oral, resp. rate 18, height 5\' 11"  (1.803 m), weight 123.5 kg (272 lb 4.3 oz), SpO2 97.00%. General appearance: alert, cooperative, appears stated age and wife at bedside Head: Normocephalic, without obvious abnormality, atraumatic Eyes: conjunctivae/corneas clear. PERRL, EOM's intact. Fundi benign. Ears: normal TM's and  external ear canals both ears Nose: Nares normal. Septum midline. Mucosa normal. No drainage or sinus tenderness. Throat: lips, mucosa, and tongue normal; teeth and gums normal Neck: no adenopathy, no carotid bruit, no JVD, supple, symmetrical, trachea midline and thyroid not enlarged, symmetric, no tenderness/mass/nodules Back: symmetric, no curvature. ROM normal. No CVA tenderness. Resp: clear to auscultation bilaterally Chest wall: no tenderness Cardio: regular rate and rhythm, S1, S2 normal, no murmur, click, rub or gallop GI: soft, non-tender; bowel sounds normal; no masses,  no organomegaly Male genitalia: normal, No discharge Extremities: extremities normal, atraumatic, no cyanosis or edema Pulses: 2+ and symmetric Skin: Skin color, texture, turgor normal. No rashes or lesions Lymph nodes: Cervical, supraclavicular, and axillary nodes normal. Neurologic: Grossly normal Incision/Wound: Urostomy pink / patent with urine + mucus as expected, red rubber catheter in situ. Recent port sites and extraction sites c/d/i without erythema / drainage / hernia.  Disposition: 01-Home or Self Care   Future Appointments Provider Department Dept Phone   04/18/2012 9:00 AM Delcie Roch [facility] [phone]   04/18/2012 9:30 AM Benjiman Core, MD [facility] [phone]       Medication List    STOP taking these medications       ALEVE 220 MG tablet  Generic drug:  naproxen sodium     aspirin 81 MG tablet     fish oil-omega-3 fatty acids 1000 MG capsule     multivitamin with minerals tablet      TAKE these medications       lisinopril 30 MG tablet  Commonly known as:  PRINIVIL,ZESTRIL  Take 30 mg by mouth every morning.     oxyCODONE-acetaminophen 5-325 MG per tablet  Commonly known as:  PERCOCET/ROXICET  Take 1-2 tablets by mouth every 6 (six) hours as needed.  Post-op.     pravastatin 20 MG tablet  Commonly  known as:  PRAVACHOL  Take 20 mg by mouth every morning.     sennosides-docusate sodium 8.6-50 MG tablet  Commonly known as:  SENOKOT-S  Take 1 tablet by mouth 2 (two) times daily. While taking pain meds to prevent constipation.           Follow-up Information   Follow up with Sebastian Ache, MD On 03/05/2012. (at 10:30)    Contact information:   [address] [place] [address] [phone]       Signed: Sebastian Ache 02/28/2012, 6:55 AM

## 2012-03-25 ENCOUNTER — Inpatient Hospital Stay (HOSPITAL_COMMUNITY): Payer: BC Managed Care – PPO

## 2012-03-25 ENCOUNTER — Inpatient Hospital Stay (HOSPITAL_COMMUNITY)
Admission: AD | Admit: 2012-03-25 | Discharge: 2012-03-29 | DRG: 567 | Disposition: A | Discharge status: Home Health | Payer: BC Managed Care – PPO | Source: Ambulatory Visit | Attending: Urology | Admitting: Urology

## 2012-03-25 ENCOUNTER — Encounter (HOSPITAL_COMMUNITY): Payer: Self-pay | Admitting: *Deleted

## 2012-03-25 DIAGNOSIS — IMO0002 Reserved for concepts with insufficient information to code with codable children: Principal | ICD-10-CM | POA: Diagnosis present

## 2012-03-25 DIAGNOSIS — I82409 Acute embolism and thrombosis of unspecified deep veins of unspecified lower extremity: Secondary | ICD-10-CM | POA: Diagnosis present

## 2012-03-25 DIAGNOSIS — Z9079 Acquired absence of other genital organ(s): Secondary | ICD-10-CM

## 2012-03-25 DIAGNOSIS — K219 Gastro-esophageal reflux disease without esophagitis: Secondary | ICD-10-CM

## 2012-03-25 DIAGNOSIS — N281 Cyst of kidney, acquired: Secondary | ICD-10-CM

## 2012-03-25 DIAGNOSIS — D72829 Elevated white blood cell count, unspecified: Secondary | ICD-10-CM | POA: Diagnosis present

## 2012-03-25 DIAGNOSIS — E876 Hypokalemia: Secondary | ICD-10-CM | POA: Diagnosis present

## 2012-03-25 DIAGNOSIS — Z96659 Presence of unspecified artificial knee joint: Secondary | ICD-10-CM

## 2012-03-25 DIAGNOSIS — I10 Essential (primary) hypertension: Secondary | ICD-10-CM | POA: Diagnosis present

## 2012-03-25 DIAGNOSIS — R110 Nausea: Secondary | ICD-10-CM | POA: Diagnosis present

## 2012-03-25 DIAGNOSIS — N1330 Unspecified hydronephrosis: Secondary | ICD-10-CM | POA: Diagnosis present

## 2012-03-25 DIAGNOSIS — A4902 Methicillin resistant Staphylococcus aureus infection, unspecified site: Secondary | ICD-10-CM | POA: Diagnosis present

## 2012-03-25 DIAGNOSIS — N323 Diverticulum of bladder: Secondary | ICD-10-CM | POA: Diagnosis present

## 2012-03-25 DIAGNOSIS — I2699 Other pulmonary embolism without acute cor pulmonale: Secondary | ICD-10-CM

## 2012-03-25 DIAGNOSIS — Z906 Acquired absence of other parts of urinary tract: Secondary | ICD-10-CM

## 2012-03-25 DIAGNOSIS — E785 Hyperlipidemia, unspecified: Secondary | ICD-10-CM | POA: Diagnosis present

## 2012-03-25 DIAGNOSIS — Y832 Surgical operation with anastomosis, bypass or graft as the cause of abnormal reaction of the patient, or of later complication, without mention of misadventure at the time of the procedure: Secondary | ICD-10-CM | POA: Diagnosis present

## 2012-03-25 DIAGNOSIS — N9989 Other postprocedural complications and disorders of genitourinary system: Principal | ICD-10-CM | POA: Diagnosis present

## 2012-03-25 DIAGNOSIS — N390 Urinary tract infection, site not specified: Secondary | ICD-10-CM | POA: Diagnosis present

## 2012-03-25 DIAGNOSIS — E119 Type 2 diabetes mellitus without complications: Secondary | ICD-10-CM | POA: Diagnosis present

## 2012-03-25 DIAGNOSIS — C679 Malignant neoplasm of bladder, unspecified: Secondary | ICD-10-CM

## 2012-03-25 DIAGNOSIS — N2889 Other specified disorders of kidney and ureter: Secondary | ICD-10-CM | POA: Diagnosis present

## 2012-03-25 LAB — APTT: aPTT: 41 seconds — ABNORMAL HIGH (ref 24–37)

## 2012-03-25 LAB — CBC WITH DIFFERENTIAL/PLATELET
Basophils Absolute: 0 10*3/uL (ref 0.0–0.1)
Basophils Relative: 0 % (ref 0–1)
Eosinophils Absolute: 0 10*3/uL (ref 0.0–0.7)
Eosinophils Relative: 0 % (ref 0–5)
HCT: 43.9 % (ref 39.0–52.0)
Hemoglobin: 14.6 g/dL (ref 13.0–17.0)
MCH: 31.5 pg (ref 26.0–34.0)
MCHC: 33.3 g/dL (ref 30.0–36.0)
Monocytes Absolute: 1.4 10*3/uL — ABNORMAL HIGH (ref 0.1–1.0)
Monocytes Relative: 7 % (ref 3–12)
RDW: 13 % (ref 11.5–15.5)

## 2012-03-25 LAB — BASIC METABOLIC PANEL
BUN: 15 mg/dL (ref 6–23)
Chloride: 95 mEq/L — ABNORMAL LOW (ref 96–112)
Creatinine, Ser: 1.09 mg/dL (ref 0.50–1.35)
Glucose, Bld: 136 mg/dL — ABNORMAL HIGH (ref 70–99)
Potassium: 4.1 mEq/L (ref 3.5–5.1)

## 2012-03-25 MED ORDER — HEPARIN (PORCINE) IN NACL 100-0.45 UNIT/ML-% IJ SOLN
1600.0000 [IU]/h | INTRAMUSCULAR | Status: DC
  Administered 2012-03-26: 1600 [IU]/h via INTRAVENOUS
  Filled 2012-03-25 (×2): qty 250

## 2012-03-25 MED ORDER — KCL IN DEXTROSE-NACL 10-5-0.45 MEQ/L-%-% IV SOLN
INTRAVENOUS | Status: DC
  Administered 2012-03-25: 18:00:00 via INTRAVENOUS
  Administered 2012-03-26: 75 mL/h via INTRAVENOUS
  Administered 2012-03-27: 09:00:00 via INTRAVENOUS
  Filled 2012-03-25 (×5): qty 1000

## 2012-03-25 MED ORDER — HYDROMORPHONE HCL PF 1 MG/ML IJ SOLN
0.5000 mg | INTRAMUSCULAR | Status: DC | PRN
  Filled 2012-03-25: qty 1

## 2012-03-25 MED ORDER — MIDAZOLAM HCL 2 MG/2ML IJ SOLN
INTRAMUSCULAR | Status: AC | PRN
  Administered 2012-03-25: 1 mg via INTRAVENOUS

## 2012-03-25 MED ORDER — HEPARIN BOLUS VIA INFUSION
5000.0000 [IU] | Freq: Once | INTRAVENOUS | Status: AC
  Administered 2012-03-26: 5000 [IU] via INTRAVENOUS
  Filled 2012-03-25: qty 5000

## 2012-03-25 MED ORDER — ONDANSETRON HCL 4 MG/2ML IJ SOLN: 4.0000 mg | INTRAMUSCULAR | Status: DC | PRN

## 2012-03-25 MED ORDER — CEFTRIAXONE SODIUM 1 G IJ SOLR
1.0000 g | INTRAMUSCULAR | Status: DC
  Administered 2012-03-25 – 2012-03-28 (×4): 1 g via INTRAVENOUS
  Filled 2012-03-25 (×4): qty 10

## 2012-03-25 MED ORDER — IOHEXOL 300 MG/ML  SOLN: 50.0000 mL | Freq: Once | INTRAMUSCULAR | Status: AC | PRN

## 2012-03-25 MED ORDER — PANTOPRAZOLE SODIUM 40 MG PO TBEC
40.0000 mg | DELAYED_RELEASE_TABLET | Freq: Every day | ORAL | Status: DC
  Administered 2012-03-26 – 2012-03-29 (×5): 40 mg via ORAL
  Filled 2012-03-25 (×5): qty 1

## 2012-03-25 MED ORDER — LIDOCAINE HCL 1 % IJ SOLN
INTRAMUSCULAR | Status: AC
  Filled 2012-03-25: qty 20

## 2012-03-25 MED ORDER — SENNA 8.6 MG PO TABS
1.0000 | ORAL_TABLET | Freq: Two times a day (BID) | ORAL | Status: DC
  Administered 2012-03-26 – 2012-03-29 (×8): 8.6 mg via ORAL
  Filled 2012-03-25 (×8): qty 1

## 2012-03-25 MED ORDER — CIPROFLOXACIN IN D5W 400 MG/200ML IV SOLN
400.0000 mg | INTRAVENOUS | Status: AC
  Administered 2012-03-25: 400 mg via INTRAVENOUS
  Filled 2012-03-25: qty 200

## 2012-03-25 MED ORDER — DOCUSATE SODIUM 100 MG PO CAPS
100.0000 mg | ORAL_CAPSULE | Freq: Two times a day (BID) | ORAL | Status: DC
  Administered 2012-03-26 – 2012-03-29 (×8): 100 mg via ORAL
  Filled 2012-03-25 (×9): qty 1

## 2012-03-25 MED ORDER — FENTANYL CITRATE 0.05 MG/ML IJ SOLN
INTRAMUSCULAR | Status: AC | PRN
  Administered 2012-03-25 (×2): 50 ug via INTRAVENOUS

## 2012-03-25 MED ORDER — MIDAZOLAM HCL 2 MG/2ML IJ SOLN
INTRAMUSCULAR | Status: AC
  Administered 2012-03-25: 2 mg
  Filled 2012-03-25: qty 6

## 2012-03-25 MED ORDER — FENTANYL CITRATE 0.05 MG/ML IJ SOLN
INTRAMUSCULAR | Status: AC
  Administered 2012-03-25: 50 ug
  Filled 2012-03-25: qty 6

## 2012-03-25 MED ORDER — HYDROCODONE-ACETAMINOPHEN 5-325 MG PO TABS
1.0000 | ORAL_TABLET | ORAL | Status: DC | PRN
  Administered 2012-03-26 – 2012-03-27 (×4): 1 via ORAL
  Filled 2012-03-25 (×5): qty 1

## 2012-03-25 NOTE — Procedures (Signed)
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  Moderate right hydronephrosis, mild left hydronephrosis.   Indications:  Urinoma  Procedure Details:   Placement of bilateral 10 French nephrostomy tubes with [place] and fluoroscopic guidance.    Findings: Mild left hydronephrosis.  Moderate right hydronephrosis.  Complications: None     Condition: Stable  Plan: Return to inpatient room.  Will need to start anticoagulation for pulmonary emboli.  Plan for nephrograms and attempt antegrade stent placement in a few days.

## 2012-03-25 NOTE — Plan of Care (Signed)
S: Notified by radiology that pt also with bilateral pulmonary emboli.  O: No CP/ SOB. No LE swelling / cords. Tachycardia noted.  A/P: 1 - Will proceed with bilateral nephrostomy tonight to maximally drain away from urinoma, and to reduce future risk of nephrostomy during anticoagulation. Discussed with on-call interventional radiology.  2 - Hospitalist consult for help with anticoagulation management. Consult pending.
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
## 2012-03-26 DIAGNOSIS — I2699 Other pulmonary embolism without acute cor pulmonale: Secondary | ICD-10-CM

## 2012-03-26 DIAGNOSIS — C679 Malignant neoplasm of bladder, unspecified: Secondary | ICD-10-CM

## 2012-03-26 DIAGNOSIS — K219 Gastro-esophageal reflux disease without esophagitis: Secondary | ICD-10-CM

## 2012-03-26 DIAGNOSIS — Q619 Cystic kidney disease, unspecified: Secondary | ICD-10-CM

## 2012-03-26 LAB — HEPARIN LEVEL (UNFRACTIONATED)
Heparin Unfractionated: 0.14 IU/mL — ABNORMAL LOW (ref 0.30–0.70)
Heparin Unfractionated: 0.34 IU/mL (ref 0.30–0.70)
Heparin Unfractionated: 0.36 IU/mL (ref 0.30–0.70)

## 2012-03-26 LAB — BASIC METABOLIC PANEL
BUN: 11 mg/dL (ref 6–23)
Calcium: 8.9 mg/dL (ref 8.4–10.5)
Creatinine, Ser: 0.91 mg/dL (ref 0.50–1.35)
GFR calc Af Amer: >90 mL/min (ref >90)
GFR calc non Af Amer: 86 mL/min — ABNORMAL LOW (ref >90)

## 2012-03-26 LAB — CBC
HCT: 40.1 % (ref 39.0–52.0)
MCH: 31.4 pg (ref 26.0–34.0)
MCHC: 33.7 g/dL (ref 30.0–36.0)
MCV: 93.3 fL (ref 78.0–100.0)
RDW: 13.2 % (ref 11.5–15.5)

## 2012-03-26 MED ORDER — HEPARIN (PORCINE) IN NACL 100-0.45 UNIT/ML-% IJ SOLN
1900.0000 [IU]/h | INTRAMUSCULAR | Status: DC
  Administered 2012-03-26 – 2012-03-27 (×3): 1900 [IU]/h via INTRAVENOUS
  Filled 2012-03-26 (×3): qty 250

## 2012-03-26 MED ORDER — HEPARIN BOLUS VIA INFUSION
3000.0000 [IU] | Freq: Once | INTRAVENOUS | Status: AC
  Administered 2012-03-26: 3000 [IU] via INTRAVENOUS
  Filled 2012-03-26: qty 3000

## 2012-03-26 MED ORDER — GLUCERNA SHAKE PO LIQD
237.0000 mL | Freq: Two times a day (BID) | ORAL | Status: DC
  Administered 2012-03-27 – 2012-03-29 (×4): 237 mL via ORAL
  Filled 2012-03-26 (×7): qty 237

## 2012-03-26 MED ORDER — POTASSIUM CHLORIDE CRYS ER 20 MEQ PO TBCR
40.0000 meq | EXTENDED_RELEASE_TABLET | Freq: Once | ORAL | Status: AC
  Administered 2012-03-26: 40 meq via ORAL
  Filled 2012-03-26: qty 2

## 2012-03-26 NOTE — Progress Notes (Signed)
Patient ID: Chad Olson, male   DOB: 25-Dec-1945, 67 y.o.   MRN: [id number]  [facility] PROGRESS NOTE  Chad Olson HYQ:[id number] DOB: 29-Aug-1945 DOA: 03/25/2012 PCP: [name], MD  Brief narrative: Pt is 67 yo male who presented with progressively worsening generalized malaise associated with poor oral intake, lower right quadrant pain, and nausea, low grade fever ~ 99.5 F, initially started 3 days prior to admission. Of note, he has had removal of bilateral ureteral stents in the urology office 03/21/2012. Pt admitted under urology service and [facility] consulted as bilateral pulmonary emboli noted on CT angio. Pt currently denies chest pain or shortness of breath, no specific new abdominal or urinary concerns, no other systemic symptoms.  Principal Problem:  UTI, bacteriuria  - urine culture pending at this time - Rocephin and Ciprofloxacin as per primary team day #2 Nausea, malaise, urinoma  - per primary team, status post placement of bilateral percutaneous nephrostomy tubes post op day #1 Active Problems:  Acute pulmonary embolism  - continue heparin infusion, plan on transition to Lovenox given history of bladder cancer   - CBC in AM  Hypokalemia - mild, will supplement, BMP in AM Bladder cancer  - s/p robotic cystoprostatectomy with intracorporeal ileal conduit urinary diversion 02/22/2012  - per primary team  Bilateral renal cysts  - non complex and stable bu imagining 01/2012  Leukocytosis, unspecified  - likely secondary to principal problem UTI, WBC trending down  - continue ABX as noted above  - CBC in AM  HTN (hypertension)  - reasonable inpatient control   Consultants:  Urology is the primary team and [facility] is consulting   Procedures/Studies: Ir US Guide Bx Asp/drain 03/26/2012 Successful placement of bilateral percutaneous nephrostomy tubes with ultrasound and fluoroscopic guidance     Antibiotics:  Ciprofloxacin 03/24 -->  Rocephin 03/24 -->  Code  Status: Full Family Communication: Pt and family at bedside Disposition Plan: Home when medically stable  HPI/Subjective: No events overnight.   Objective: Filed Vitals:   03/25/12 2100 03/26/12 0217 03/26/12 0644 03/26/12 1500  BP: 138/86 127/79 133/78 117/71  Pulse: 92 104 94 75  Temp:  99.6 F (37.6 C) 98.8 F (37.1 C) 98.8 F (37.1 C)  TempSrc:  Oral Oral Oral  Resp: 24 22 20 20   Height:      Weight:      SpO2:  97% 94% 95%    Intake/Output Summary (Last 24 hours) at 03/26/12 1557 Last data filed at 03/26/12 1500  Gross per 24 hour  Intake 2981.25 ml  Output   1600 ml  Net 1381.25 ml    Exam:   General:  Pt is alert, follows commands appropriately, not in acute distress  Cardiovascular: Regular rate and rhythm, S1/S2, no murmurs, no rubs, no gallops  Respiratory: Clear to auscultation bilaterally, no wheezing, no crackles, no rhonchi  Abdomen: Soft, non tender, non distended, bowel sounds present, no guarding  Extremities: No edema, pulses DP and PT palpable bilaterally  Neuro: Grossly nonfocal  Data Reviewed: Basic Metabolic Panel:  Recent Labs Lab 03/25/12 1655 03/26/12 0430  NA 134* 135  K 4.1 3.4*  CL 95* 100  CO2 28 25  GLUCOSE 136* 147*  BUN 15 11  CREATININE 1.09 0.91  CALCIUM 9.6 8.9   CBC:  Recent Labs Lab 03/25/12 1655 03/26/12 0430  WBC 20.1* 14.9*  NEUTROABS 17.5*  --   HGB 14.6 13.5  HCT 43.9 40.1  MCV 94.6 93.3  PLT 256 232  Scheduled Meds: . cefTRIAXone (ROCEPHIN)  IV  1 g Intravenous Q24H  . docusate sodium  100 mg Oral BID  . feeding supplement  237 mL Oral BID BM  . pantoprazole  40 mg Oral Daily  . potassium chloride  40 mEq Oral Once  . senna  1 tablet Oral BID   Continuous Infusions: . dextrose 5 % and 0.45 % NaCl with KCl 10 mEq/L 75 mL/hr at 03/25/12 1819  . heparin 1,900 Units/hr (03/26/12 1019)     Debbora Presto, MD  [facility] Pager [phone]  If 7PM-7AM, please contact  night-coverage [email or web address] Password [facility] 03/26/2012, 3:57 PM   LOS: 1 day

## 2012-03-26 NOTE — Progress Notes (Signed)
Subjective: Pt eating lunch; without new c/o; denies flank pain,n/v  Objective: Vital signs in last 24 hours: Temp:  [98.4 F (36.9 C)-99.6 F (37.6 C)] 98.8 F (37.1 C) (03/25 0644) Pulse Rate:  [92-109] 94 (03/25 0644) Resp:  [18-33] 20 (03/25 0644) BP: (123-138)/(78-99) 133/78 mmHg (03/25 0644) SpO2:  [94 %-99 %] 94 % (03/25 0644) Weight:  [275 lb (124.739 kg)] 275 lb (124.739 kg) (03/24 1650) Last BM Date: 03/24/12  Intake/Output from previous day: 03/24 0701 - 03/25 0700 In: 2801.3 [I.V.:951.3; IV Piggyback:250] Out: 300 [Urine:300] Intake/Output this shift:     bilat PCN's intact, insertion sites ok, about 300 cc's tea colored urine in each bag; urine cx's pending  Lab Results:   Recent Labs  03/25/12 1655 03/26/12 0430  WBC 20.1* 14.9*  HGB 14.6 13.5  HCT 43.9 40.1  PLT 256 232   BMET  Recent Labs  03/25/12 1655 03/26/12 0430  NA 134* 135  K 4.1 3.4*  CL 95* 100  CO2 28 25  GLUCOSE 136* 147*  BUN 15 11  CREATININE 1.09 0.91  CALCIUM 9.6 8.9   PT/INR  Recent Labs  03/25/12 1655  LABPROT 15.9*  INR 1.30   ABG No results found for this basename: PHART, PCO2, PO2, HCO3,  in the last 72 hours  Studies/Results: Ir Nephrostogram Right  03/26/2012  *RADIOLOGY REPORT*  Clinical history:67 year old with history of bladder cancer and status post cystectomy with an ileal conduit.  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  The patient needs percutaneous nephrostomy tubes to decompress the urinoma.  PROCEDURE(S): PLACEMENT OF BILATERAL PERCUTANEOUS NEPHROSTOMY TUBES WITH ULTRASOUND AND FLUOROSCOPIC GUIDANCE  Physician: Rachelle Hora. Henn, MD  Medications:Versed 3 mg, Fentanyl 150 mcg.  Ciprofloxacin 400 mg IV. A radiology nurse monitored the patient for moderate sedation. Ciprofloxacin was given within two hours of incision.  Moderate sedation time:30 minutes  Fluoroscopy time: 2.9 minutes  Procedure:The procedure was explained to the  patient.  The risks and benefits of the procedure were discussed and the patient's questions were addressed.  Informed consent was obtained from the patient.  The patient was placed prone.  Both flanks were prepped and draped in the sterile fashion.  Maximal barrier sterile technique was utilized including caps, mask, sterile gowns, sterile gloves, sterile drape, hand hygiene and skin antiseptic. Ultrasound demonstrated mild hydronephrosis on the left.  There was contrast within the left renal collecting system by fluoroscopy. The left lower pole collecting system was targeted with a 21 gauge needle using ultrasound and fluoroscopic guidance.  Needle was positioned within the lower pole calix.  A 0.018 wire was placed and an Accustick dilator set was placed.  The tract was dilated to accommodate a 10-French multipurpose drain.  The drain was reconstituted in the renal pelvis.  Catheter was sutured to the skin.  Ultrasound demonstrated moderate right hydronephrosis.  A mid pole calix was targeted with a 21 gauge needle using ultrasound guidance.  Needle was directed into the collecting system and a wire was advanced into the renal pelvis. An Accustick dilator set was placed and the tract was dilated to accommodate a 10-French multipurpose drain.  Catheter was reconstituted in the renal pelvis.  Catheter was sutured to the skin.  Fluoroscopic and ultrasound images were taken and saved for documentation.  Findings:Mild left hydronephrosis.  Left percutaneous nephrostomy tube is in the renal pelvis.  Moderate right hydronephrosis.  Right nephrostomy tube within the renal pelvis.  Complications: None  Impression:Successful placement  of bilateral percutaneous nephrostomy tubes with ultrasound and fluoroscopic guidance   Original Report Authenticated By: Richarda Overlie, M.D.    Ir Perc Nephrostomy Left  03/26/2012  *RADIOLOGY REPORT*  Clinical history:67 year old with history of bladder cancer and status post cystectomy with  an ileal conduit.  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  The patient needs percutaneous nephrostomy tubes to decompress the urinoma.  PROCEDURE(S): PLACEMENT OF BILATERAL PERCUTANEOUS NEPHROSTOMY TUBES WITH ULTRASOUND AND FLUOROSCOPIC GUIDANCE  Physician: Rachelle Hora. Henn, MD  Medications:Versed 3 mg, Fentanyl 150 mcg.  Ciprofloxacin 400 mg IV. A radiology nurse monitored the patient for moderate sedation. Ciprofloxacin was given within two hours of incision.  Moderate sedation time:30 minutes  Fluoroscopy time: 2.9 minutes  Procedure:The procedure was explained to the patient.  The risks and benefits of the procedure were discussed and the patient's questions were addressed.  Informed consent was obtained from the patient.  The patient was placed prone.  Both flanks were prepped and draped in the sterile fashion.  Maximal barrier sterile technique was utilized including caps, mask, sterile gowns, sterile gloves, sterile drape, hand hygiene and skin antiseptic. Ultrasound demonstrated mild hydronephrosis on the left.  There was contrast within the left renal collecting system by fluoroscopy. The left lower pole collecting system was targeted with a 21 gauge needle using ultrasound and fluoroscopic guidance.  Needle was positioned within the lower pole calix.  A 0.018 wire was placed and an Accustick dilator set was placed.  The tract was dilated to accommodate a 10-French multipurpose drain.  The drain was reconstituted in the renal pelvis.  Catheter was sutured to the skin.  Ultrasound demonstrated moderate right hydronephrosis.  A mid pole calix was targeted with a 21 gauge needle using ultrasound guidance.  Needle was directed into the collecting system and a wire was advanced into the renal pelvis. An Accustick dilator set was placed and the tract was dilated to accommodate a 10-French multipurpose drain.  Catheter was reconstituted in the renal pelvis.  Catheter was sutured to the skin.   Fluoroscopic and ultrasound images were taken and saved for documentation.  Findings:Mild left hydronephrosis.  Left percutaneous nephrostomy tube is in the renal pelvis.  Moderate right hydronephrosis.  Right nephrostomy tube within the renal pelvis.  Complications: None  Impression:Successful placement of bilateral percutaneous nephrostomy tubes with ultrasound and fluoroscopic guidance   Original Report Authenticated By: Richarda Overlie, M.D.    Ir US Guide Bx Asp/drain  03/26/2012  *RADIOLOGY REPORT*  Clinical history:67 year old with history of bladder cancer and status post cystectomy with an ileal conduit.  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  The patient needs percutaneous nephrostomy tubes to decompress the urinoma.  PROCEDURE(S): PLACEMENT OF BILATERAL PERCUTANEOUS NEPHROSTOMY TUBES WITH ULTRASOUND AND FLUOROSCOPIC GUIDANCE  Physician: Rachelle Hora. Henn, MD  Medications:Versed 3 mg, Fentanyl 150 mcg.  Ciprofloxacin 400 mg IV. A radiology nurse monitored the patient for moderate sedation. Ciprofloxacin was given within two hours of incision.  Moderate sedation time:30 minutes  Fluoroscopy time: 2.9 minutes  Procedure:The procedure was explained to the patient.  The risks and benefits of the procedure were discussed and the patient's questions were addressed.  Informed consent was obtained from the patient.  The patient was placed prone.  Both flanks were prepped and draped in the sterile fashion.  Maximal barrier sterile technique was utilized including caps, mask, sterile gowns, sterile gloves, sterile drape, hand hygiene and skin antiseptic. Ultrasound demonstrated mild hydronephrosis on the  left.  There was contrast within the left renal collecting system by fluoroscopy. The left lower pole collecting system was targeted with a 21 gauge needle using ultrasound and fluoroscopic guidance.  Needle was positioned within the lower pole calix.  A 0.018 wire was placed and an Accustick  dilator set was placed.  The tract was dilated to accommodate a 10-French multipurpose drain.  The drain was reconstituted in the renal pelvis.  Catheter was sutured to the skin.  Ultrasound demonstrated moderate right hydronephrosis.  A mid pole calix was targeted with a 21 gauge needle using ultrasound guidance.  Needle was directed into the collecting system and a wire was advanced into the renal pelvis. An Accustick dilator set was placed and the tract was dilated to accommodate a 10-French multipurpose drain.  Catheter was reconstituted in the renal pelvis.  Catheter was sutured to the skin.  Fluoroscopic and ultrasound images were taken and saved for documentation.  Findings:Mild left hydronephrosis.  Left percutaneous nephrostomy tube is in the renal pelvis.  Moderate right hydronephrosis.  Right nephrostomy tube within the renal pelvis.  Complications: None  Impression:Successful placement of bilateral percutaneous nephrostomy tubes with ultrasound and fluoroscopic guidance   Original Report Authenticated By: Richarda Overlie, M.D.    Ir US Guide Bx Asp/drain  03/26/2012  *RADIOLOGY REPORT*  Clinical history:67 year old with history of bladder cancer and status post cystectomy with an ileal conduit.  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  The patient needs percutaneous nephrostomy tubes to decompress the urinoma.  PROCEDURE(S): PLACEMENT OF BILATERAL PERCUTANEOUS NEPHROSTOMY TUBES WITH ULTRASOUND AND FLUOROSCOPIC GUIDANCE  Physician: Rachelle Hora. Henn, MD  Medications:Versed 3 mg, Fentanyl 150 mcg.  Ciprofloxacin 400 mg IV. A radiology nurse monitored the patient for moderate sedation. Ciprofloxacin was given within two hours of incision.  Moderate sedation time:30 minutes  Fluoroscopy time: 2.9 minutes  Procedure:The procedure was explained to the patient.  The risks and benefits of the procedure were discussed and the patient's questions were addressed.  Informed consent was obtained from  the patient.  The patient was placed prone.  Both flanks were prepped and draped in the sterile fashion.  Maximal barrier sterile technique was utilized including caps, mask, sterile gowns, sterile gloves, sterile drape, hand hygiene and skin antiseptic. Ultrasound demonstrated mild hydronephrosis on the left.  There was contrast within the left renal collecting system by fluoroscopy. The left lower pole collecting system was targeted with a 21 gauge needle using ultrasound and fluoroscopic guidance.  Needle was positioned within the lower pole calix.  A 0.018 wire was placed and an Accustick dilator set was placed.  The tract was dilated to accommodate a 10-French multipurpose drain.  The drain was reconstituted in the renal pelvis.  Catheter was sutured to the skin.  Ultrasound demonstrated moderate right hydronephrosis.  A mid pole calix was targeted with a 21 gauge needle using ultrasound guidance.  Needle was directed into the collecting system and a wire was advanced into the renal pelvis. An Accustick dilator set was placed and the tract was dilated to accommodate a 10-French multipurpose drain.  Catheter was reconstituted in the renal pelvis.  Catheter was sutured to the skin.  Fluoroscopic and ultrasound images were taken and saved for documentation.  Findings:Mild left hydronephrosis.  Left percutaneous nephrostomy tube is in the renal pelvis.  Moderate right hydronephrosis.  Right nephrostomy tube within the renal pelvis.  Complications: None  Impression:Successful placement of bilateral percutaneous nephrostomy tubes with ultrasound and fluoroscopic  guidance   Original Report Authenticated By: Richarda Overlie, M.D.     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  LOS: 1 day    Amanada Philbrick,D [facility] 03/26/2012

## 2012-03-26 NOTE — Progress Notes (Signed)
[facility]  Patient Status: Active (receiving services up to time of hospitalization)  [facility] is providing the following services: RN and PT  If patient discharges after hours, please call [phone].   Chad Olson 03/26/2012, 2:33 PM

## 2012-03-26 NOTE — Progress Notes (Signed)
ANTICOAGULATION CONSULT NOTE - Follow Up Consult  Pharmacy Consult for Heparin  Indication: PE  No Known Allergies  Patient Measurements: Height: 5\' 11"  (180.3 cm) Weight: 275 lb (124.739 kg) IBW/kg (Calculated) : 75.3   Vital Signs: Temp: 98.8 F (37.1 C) (03/25 1500) Temp src: Oral (03/25 1500) BP: 117/71 mmHg (03/25 1500) Pulse Rate: 75 (03/25 1500)  Labs:  Recent Labs  03/25/12 1655 03/26/12 0430 03/26/12 0824 03/26/12 1703  HGB 14.6 13.5  --   --   HCT 43.9 40.1  --   --   PLT 256 232  --   --   APTT 41*  --   --   --   LABPROT 15.9*  --   --   --   INR 1.30  --   --   --   HEPARINUNFRC  --   --  0.14* 0.34  CREATININE 1.09 0.91  --   --     Estimated Creatinine Clearance: 107.4 ml/min (by C-G formula based on Cr of 0.91).   Medications:  Scheduled:  . cefTRIAXone (ROCEPHIN)  IV  1 g Intravenous Q24H  . [COMPLETED] ciprofloxacin  400 mg Intravenous On Call  . docusate sodium  100 mg Oral BID  . feeding supplement  237 mL Oral BID BM  . [COMPLETED] fentaNYL      . [COMPLETED] heparin  3,000 Units Intravenous Once  . [COMPLETED] heparin  5,000 Units Intravenous Once  . [EXPIRED] lidocaine      . [COMPLETED] midazolam      . pantoprazole  40 mg Oral Daily  . potassium chloride  40 mEq Oral Once  . senna  1 tablet Oral BID   Infusions:  . dextrose 5 % and 0.45 % NaCl with KCl 10 mEq/L 75 mL/hr at 03/25/12 1819  . heparin 1,900 Units/hr (03/26/12 1019)  . [DISCONTINUED] heparin 1,600 Units/hr (03/26/12 0040)   PRN: [COMPLETED] fentaNYL, HYDROcodone-acetaminophen, HYDROmorphone (DILAUDID) injection, [EXPIRED] iohexol, [COMPLETED] midazolam, ondansetron  Assessment: Heparin level 0.34 is therapeutic this evening  Goal of Therapy:  Heparin level 0.3-0.7 units/ml Monitor platelets by anticoagulation protocol: Yes   Plan:  Continue heparin at 1900 units/hr Repeat heparin level at 2300 tonight to ensure therapeutic level not from bolus effect.    BorgerdingLoma Messing PharmD Pager #: [phone] 5:43 PM 03/26/2012
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
## 2012-03-26 NOTE — Progress Notes (Signed)
PT Cancellation Note  Patient Details Name: LOGON UTTECH MRN: [id number] DOB: 06-04-1945   Cancelled Treatment:    Reason Eval/Treat Not Completed: Medical issues which prohibited therapy RN reports pt up to chair and states MD would only like pt to chair today due to PEs.  Will check back tomorrow for eval.   Abrian Hanover,KATHrine E 03/26/2012, 9:18 AM Pager: [phone]

## 2012-03-26 NOTE — Progress Notes (Signed)
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 Directly admitted and underwent urgent bilateral nephrostomy by IR 3/24.  2 - Bilateral Pulmonary Emboli - Incidental on CT imaging for post-op malaise. Now on heparin gtt per hospitalist service, appreciate. No s/s excess bleeding. No CP/SOB.  3 - Bacteruria / UTI - Pt with low-grade fevers and bacteruria at presentation. On emperic Cipro + Rocephin / UCX pending.  Today Chad Olson is w/o complaints. His new bilateral nephrostomy tubes are draining well. His conduit is till putting out some urine.    Objective: Vital signs in last 24 hours: Temp:  [98.4 F (36.9 C)-99.6 F (37.6 C)] 99.6 F (37.6 C) (03/25 0217) Pulse Rate:  [92-109] 104 (03/25 0217) Resp:  [18-33] 22 (03/25 0217) BP: (123-138)/(79-99) 127/79 mmHg (03/25 0217) SpO2:  [96 %-99 %] 97 % (03/25 0217) Weight:  [124.739 kg (275 lb)] 124.739 kg (275 lb) (03/24 1650) Last BM Date: 03/24/12  Intake/Output from previous day: 03/24 0701 - 03/25 0700 In: 1400  Out: 300 [Urine:300] Intake/Output this shift: Total I/O In: 1400 [Other:1400] Out: 300 [Urine:300]  General appearance: alert, cooperative, appears stated age and Familiy at bedside Head: Normocephalic, without obvious abnormality, atraumatic Eyes: conjunctivae/corneas clear. PERRL, EOM's intact. Fundi benign. Ears: normal TM's and external ear canals both ears Nose: Nares normal. Septum midline. Mucosa normal. No drainage or sinus tenderness. Throat: lips, mucosa, and tongue normal; teeth and gums normal Neck: no adenopathy, no carotid  bruit, no JVD, supple, symmetrical, trachea midline and thyroid not enlarged, symmetric, no tenderness/mass/nodules Back: symmetric, no curvature. ROM normal. No CVA tenderness. Resp: clear to auscultation bilaterally Chest wall: no tenderness Cardio: regular rate and rhythm, S1, S2 normal, no murmur, click, rub or gallop GI: soft, non-tender; bowel sounds normal; no masses,  no organomegaly Male genitalia: normal Extremities: extremities normal, atraumatic, no cyanosis or edema Pulses: 2+ and symmetric Skin: Skin color, texture, turgor normal. No rashes or lesions Lymph nodes: Cervical, supraclavicular, and axillary nodes normal. Neurologic: Grossly normal Incision/Wound: Bilateral neph tube sites c/d/i. No hematoma, clear urine. Ursotomy pink / patent with clear urine.   Lab Results:   Recent Labs  03/25/12 1655 03/26/12 0430  WBC 20.1* 14.9*  HGB 14.6 13.5  HCT 43.9 40.1  PLT 256 232   BMET  Recent Labs  03/25/12 1655 03/26/12 0430  NA 134* 135  K 4.1 3.4*  CL 95* 100  CO2 28 25  GLUCOSE 136* 147*  BUN 15 11  CREATININE 1.09 0.91  CALCIUM 9.6 8.9   PT/INR  Recent Labs  03/25/12 1655  LABPROT 15.9*  INR 1.30   ABG No results found for this basename: PHART, PCO2, PO2, HCO3,  in the last 72 hours  Studies/Results: No results found.  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 Will arrange for bilateral antegrade nephrostgrams / attempt nephroureteral stent across anastamosis in few days after fevers resolved  and UCX final. I explained to pt that he may even require revision surgery should attempts to get stent across fail.  2 - Bilateral Pulmonary Emboli -Heparin gtt per hospitalist service, appreciate.  3 - Bacteruria / UTI - continue emperic ABX, await final UCX  4 - Disposition  - Remain in house. PT eval today.  [facility], Velma Agnes 03/26/2012

## 2012-03-26 NOTE — Progress Notes (Signed)
ANTICOAGULATION CONSULT NOTE - Follow Up Consult  Pharmacy Consult for IV heparin Indication: pulmonary embolus  No Known Allergies  Patient Measurements: Height: 5\' 11"  (180.3 cm) Weight: 275 lb (124.739 kg) IBW/kg (Calculated) : 75.3 Heparin Dosing Weight: 103 kg  Vital Signs: Temp: 98.8 F (37.1 C) (03/25 0644) Temp src: Oral (03/25 0644) BP: 133/78 mmHg (03/25 0644) Pulse Rate: 94 (03/25 0644)  Labs:  Recent Labs  03/25/12 1655 03/26/12 0430 03/26/12 0824  HGB 14.6 13.5  --   HCT 43.9 40.1  --   PLT 256 232  --   APTT 41*  --   --   LABPROT 15.9*  --   --   INR 1.30  --   --   HEPARINUNFRC  --   --  0.14*  CREATININE 1.09 0.91  --     Estimated Creatinine Clearance: 107.4 ml/min (by C-G formula based on Cr of 0.91).   Assessment: 26 yom with bladder cancer s/p cystectomy and urinary diversion 02/22/12. Bilateral ureteral stents were removed 03/21/12. Patient seen in office 3/24 with c/o nausea, malaise. CT at office c/w RLQ urinoma and bilateral PE.  IV heparin started early 3/25.   IV heparin currently running at 1600 units/hr and first heparin level low at 0.14.  No issues per RN.  CBC wnl.    Goal of Therapy:  Heparin level 0.3-0.7 units/ml Monitor platelets by anticoagulation protocol: Yes   Plan:   Heparin 3000 units bolus x 1   Then increase heparin drip to 1900 units/hr  Heparin level at 1700 tonight  Daily heparin level and CBC  Follow up long-term anticoagulation plans - prefer long term Lovenox given h/o malignancy.   Geoffry Paradise, PharmD, BCPS Pager: [phone] 9:25 AM Pharmacy #: [phone]

## 2012-03-27 DIAGNOSIS — I2699 Other pulmonary embolism without acute cor pulmonale: Secondary | ICD-10-CM

## 2012-03-27 LAB — BASIC METABOLIC PANEL
Calcium: 8.5 mg/dL (ref 8.4–10.5)
GFR calc Af Amer: >90 mL/min (ref >90)
GFR calc non Af Amer: >90 mL/min (ref >90)
Glucose, Bld: 140 mg/dL — ABNORMAL HIGH (ref 70–99)
Potassium: 3.6 mEq/L (ref 3.5–5.1)
Sodium: 137 mEq/L (ref 135–145)

## 2012-03-27 LAB — CBC
Hemoglobin: 12.9 g/dL — ABNORMAL LOW (ref 13.0–17.0)
MCH: 30.8 pg (ref 26.0–34.0)
MCHC: 32.9 g/dL (ref 30.0–36.0)
Platelets: 231 10*3/uL (ref 150–400)
RDW: 13.2 % (ref 11.5–15.5)

## 2012-03-27 LAB — HEPARIN LEVEL (UNFRACTIONATED): Heparin Unfractionated: 0.3 IU/mL (ref 0.30–0.70)

## 2012-03-27 MED ORDER — HEPARIN (PORCINE) IN NACL 100-0.45 UNIT/ML-% IJ SOLN
1950.0000 [IU]/h | INTRAMUSCULAR | Status: DC
  Administered 2012-03-28: 1950 [IU]/h via INTRAVENOUS
  Filled 2012-03-27 (×3): qty 250

## 2012-03-27 NOTE — Progress Notes (Signed)
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 Directly admitted and underwent urgent bilateral nephrostomy by IR 3/24.   2 - Bilateral Pulmonary Emboli - Incidental on CT imaging for post-op malaise. Now on heparin gtt per hospitalist service, appreciate. No s/s excess bleeding. No CP/SOB.   3 - Bacteruria / UTI - Pt with low-grade fevers and bacteruria at presentation. On emperic Cipro + Rocephin / UCX pending. Afebrile now.  Today Chad Olson is w/o complaints. His new bilateral nephrostomy tubes are draining well. His conduit is till putting out some urine. He was ambulatory some yesterday, but not much. Minimal hematuria from conduit or neph tubes.    Objective: Vital signs in last 24 hours: Temp:  [98.2 F (36.8 C)-98.8 F (37.1 C)] 98.2 F (36.8 C) (03/26 0513) Pulse Rate:  [75-84] 76 (03/26 0513) Resp:  [20] 20 (03/26 0513) BP: (117-125)/(69-79) 121/69 mmHg (03/26 0513) SpO2:  [94 %-97 %] 97 % (03/26 0513) Last BM Date: 03/24/12  Intake/Output from previous day: 03/25 0701 - 03/26 0700 In: 1092.1 [P.O.:300; I.V.:742.1; IV Piggyback:50] Out: 3675 [Urine:3675] Intake/Output this shift: Total I/O In: -  Out: 1925 [Urine:1925]  General appearance: alert, cooperative and appears stated age Head: Normocephalic, without obvious abnormality, atraumatic Eyes: conjunctivae/corneas clear. PERRL, EOM's intact. Fundi benign. Ears: normal TM's and external ear canals both ears Nose: Nares normal. Septum midline. Mucosa normal. No drainage or sinus tenderness. Throat: lips, mucosa, and tongue normal; teeth  and gums normal Neck: no adenopathy, no carotid bruit, no JVD, supple, symmetrical, trachea midline and thyroid not enlarged, symmetric, no tenderness/mass/nodules Back: symmetric, no curvature. ROM normal. No CVA tenderness., Neph tubes c/d/i with clear yellow urine. Resp: clear to auscultation bilaterally Chest wall: no tenderness Cardio: regular rate and rhythm, S1, S2 normal, no murmur, click, rub or gallop GI: soft, non-tender; bowel sounds normal; no masses,  no organomegaly Male genitalia: normal Extremities: extremities normal, atraumatic, no cyanosis or edema Pulses: 2+ and symmetric Skin: Skin color, texture, turgor normal. No rashes or lesions Lymph nodes: Cervical, supraclavicular, and axillary nodes normal. Neurologic: Grossly normal Incision/Wound: Urostomy pink / patent.   Lab Results:   Recent Labs  03/26/12 0430 03/27/12 0456  WBC 14.9* 9.9  HGB 13.5 12.9*  HCT 40.1 39.2  PLT 232 231   BMET  Recent Labs  03/26/12 0430 03/27/12 0456  NA 135 137  K 3.4* 3.6  CL 100 102  CO2 25 26  GLUCOSE 147* 140*  BUN 11 7  CREATININE 0.91 0.79  CALCIUM 8.9 8.5   PT/INR  Recent Labs  03/25/12 1655  LABPROT 15.9*  INR 1.30   ABG No results found for this basename: PHART, PCO2, PO2, HCO3,  in the last 72 hours  Studies/Results: Ir Nephrostogram Right  03/26/2012  *RADIOLOGY REPORT*  Clinical history:67 year old with history of bladder cancer and status post cystectomy with an ileal conduit.  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  The patient needs percutaneous nephrostomy tubes to decompress the urinoma.  PROCEDURE(S): PLACEMENT OF BILATERAL PERCUTANEOUS NEPHROSTOMY TUBES WITH ULTRASOUND AND FLUOROSCOPIC GUIDANCE  Physician: Rachelle Hora. Henn, MD  Medications:Versed 3 mg, Fentanyl 150 mcg.  Ciprofloxacin 400 mg IV.  A radiology nurse monitored the patient for moderate sedation. Ciprofloxacin was given within two hours of incision.  Moderate  sedation time:30 minutes  Fluoroscopy time: 2.9 minutes  Procedure:The procedure was explained to the patient.  The risks and benefits of the procedure were discussed and the patient's questions were addressed.  Informed consent was obtained from the patient.  The patient was placed prone.  Both flanks were prepped and draped in the sterile fashion.  Maximal barrier sterile technique was utilized including caps, mask, sterile gowns, sterile gloves, sterile drape, hand hygiene and skin antiseptic. Ultrasound demonstrated mild hydronephrosis on the left.  There was contrast within the left renal collecting system by fluoroscopy. The left lower pole collecting system was targeted with a 21 gauge needle using ultrasound and fluoroscopic guidance.  Needle was positioned within the lower pole calix.  A 0.018 wire was placed and an Accustick dilator set was placed.  The tract was dilated to accommodate a 10-French multipurpose drain.  The drain was reconstituted in the renal pelvis.  Catheter was sutured to the skin.  Ultrasound demonstrated moderate right hydronephrosis.  A mid pole calix was targeted with a 21 gauge needle using ultrasound guidance.  Needle was directed into the collecting system and a wire was advanced into the renal pelvis. An Accustick dilator set was placed and the tract was dilated to accommodate a 10-French multipurpose drain.  Catheter was reconstituted in the renal pelvis.  Catheter was sutured to the skin.  Fluoroscopic and ultrasound images were taken and saved for documentation.  Findings:Mild left hydronephrosis.  Left percutaneous nephrostomy tube is in the renal pelvis.  Moderate right hydronephrosis.  Right nephrostomy tube within the renal pelvis.  Complications: None  Impression:Successful placement of bilateral percutaneous nephrostomy tubes with ultrasound and fluoroscopic guidance   Original Report Authenticated By: Richarda Overlie, M.D.    Ir Perc Nephrostomy Left  03/26/2012   *RADIOLOGY REPORT*  Clinical history:67 year old with history of bladder cancer and status post cystectomy with an ileal conduit.  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  The patient needs percutaneous nephrostomy tubes to decompress the urinoma.  PROCEDURE(S): PLACEMENT OF BILATERAL PERCUTANEOUS NEPHROSTOMY TUBES WITH ULTRASOUND AND FLUOROSCOPIC GUIDANCE  Physician: Rachelle Hora. Henn, MD  Medications:Versed 3 mg, Fentanyl 150 mcg.  Ciprofloxacin 400 mg IV. A radiology nurse monitored the patient for moderate sedation. Ciprofloxacin was given within two hours of incision.  Moderate sedation time:30 minutes  Fluoroscopy time: 2.9 minutes  Procedure:The procedure was explained to the patient.  The risks and benefits of the procedure were discussed and the patient's questions were addressed.  Informed consent was obtained from the patient.  The patient was placed prone.  Both flanks were prepped and draped in the sterile fashion.  Maximal barrier sterile technique was utilized including caps, mask, sterile gowns, sterile gloves, sterile drape, hand hygiene and skin antiseptic. Ultrasound demonstrated mild hydronephrosis on the left.  There was contrast within the left renal collecting system by fluoroscopy. The left lower pole collecting system was targeted with a 21 gauge needle using ultrasound and fluoroscopic guidance.  Needle was positioned within the lower pole calix.  A 0.018 wire was placed and an Accustick dilator set was placed.  The tract was dilated to accommodate a 10-French multipurpose drain.  The drain was reconstituted in the renal pelvis.  Catheter was sutured to the skin.  Ultrasound demonstrated moderate right hydronephrosis.  A mid pole calix was targeted with a 21 gauge needle using ultrasound  guidance.  Needle was directed into the collecting system and a wire was advanced into the renal pelvis. An Accustick dilator set was placed and the tract was dilated to accommodate a  10-French multipurpose drain.  Catheter was reconstituted in the renal pelvis.  Catheter was sutured to the skin.  Fluoroscopic and ultrasound images were taken and saved for documentation.  Findings:Mild left hydronephrosis.  Left percutaneous nephrostomy tube is in the renal pelvis.  Moderate right hydronephrosis.  Right nephrostomy tube within the renal pelvis.  Complications: None  Impression:Successful placement of bilateral percutaneous nephrostomy tubes with ultrasound and fluoroscopic guidance   Original Report Authenticated By: Richarda Overlie, M.D.    Ir US Guide Bx Asp/drain  03/26/2012  *RADIOLOGY REPORT*  Clinical history:67 year old with history of bladder cancer and status post cystectomy with an ileal conduit.  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  The patient needs percutaneous nephrostomy tubes to decompress the urinoma.  PROCEDURE(S): PLACEMENT OF BILATERAL PERCUTANEOUS NEPHROSTOMY TUBES WITH ULTRASOUND AND FLUOROSCOPIC GUIDANCE  Physician: Rachelle Hora. Henn, MD  Medications:Versed 3 mg, Fentanyl 150 mcg.  Ciprofloxacin 400 mg IV. A radiology nurse monitored the patient for moderate sedation. Ciprofloxacin was given within two hours of incision.  Moderate sedation time:30 minutes  Fluoroscopy time: 2.9 minutes  Procedure:The procedure was explained to the patient.  The risks and benefits of the procedure were discussed and the patient's questions were addressed.  Informed consent was obtained from the patient.  The patient was placed prone.  Both flanks were prepped and draped in the sterile fashion.  Maximal barrier sterile technique was utilized including caps, mask, sterile gowns, sterile gloves, sterile drape, hand hygiene and skin antiseptic. Ultrasound demonstrated mild hydronephrosis on the left.  There was contrast within the left renal collecting system by fluoroscopy. The left lower pole collecting system was targeted with a 21 gauge needle using ultrasound and  fluoroscopic guidance.  Needle was positioned within the lower pole calix.  A 0.018 wire was placed and an Accustick dilator set was placed.  The tract was dilated to accommodate a 10-French multipurpose drain.  The drain was reconstituted in the renal pelvis.  Catheter was sutured to the skin.  Ultrasound demonstrated moderate right hydronephrosis.  A mid pole calix was targeted with a 21 gauge needle using ultrasound guidance.  Needle was directed into the collecting system and a wire was advanced into the renal pelvis. An Accustick dilator set was placed and the tract was dilated to accommodate a 10-French multipurpose drain.  Catheter was reconstituted in the renal pelvis.  Catheter was sutured to the skin.  Fluoroscopic and ultrasound images were taken and saved for documentation.  Findings:Mild left hydronephrosis.  Left percutaneous nephrostomy tube is in the renal pelvis.  Moderate right hydronephrosis.  Right nephrostomy tube within the renal pelvis.  Complications: None  Impression:Successful placement of bilateral percutaneous nephrostomy tubes with ultrasound and fluoroscopic guidance   Original Report Authenticated By: Richarda Overlie, M.D.    Ir US Guide Bx Asp/drain  03/26/2012  *RADIOLOGY REPORT*  Clinical history:67 year old with history of bladder cancer and status post cystectomy with an ileal conduit.  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  The patient needs percutaneous nephrostomy tubes to decompress the urinoma.  PROCEDURE(S): PLACEMENT OF BILATERAL PERCUTANEOUS NEPHROSTOMY TUBES WITH ULTRASOUND AND FLUOROSCOPIC GUIDANCE  Physician: Rachelle Hora. Henn, MD  Medications:Versed 3 mg, Fentanyl 150 mcg.  Ciprofloxacin 400 mg IV. A radiology nurse monitored the patient for moderate sedation.  Ciprofloxacin was given within two hours of incision.  Moderate sedation time:30 minutes  Fluoroscopy time: 2.9 minutes  Procedure:The procedure was explained to the patient.  The risks and  benefits of the procedure were discussed and the patient's questions were addressed.  Informed consent was obtained from the patient.  The patient was placed prone.  Both flanks were prepped and draped in the sterile fashion.  Maximal barrier sterile technique was utilized including caps, mask, sterile gowns, sterile gloves, sterile drape, hand hygiene and skin antiseptic. Ultrasound demonstrated mild hydronephrosis on the left.  There was contrast within the left renal collecting system by fluoroscopy. The left lower pole collecting system was targeted with a 21 gauge needle using ultrasound and fluoroscopic guidance.  Needle was positioned within the lower pole calix.  A 0.018 wire was placed and an Accustick dilator set was placed.  The tract was dilated to accommodate a 10-French multipurpose drain.  The drain was reconstituted in the renal pelvis.  Catheter was sutured to the skin.  Ultrasound demonstrated moderate right hydronephrosis.  A mid pole calix was targeted with a 21 gauge needle using ultrasound guidance.  Needle was directed into the collecting system and a wire was advanced into the renal pelvis. An Accustick dilator set was placed and the tract was dilated to accommodate a 10-French multipurpose drain.  Catheter was reconstituted in the renal pelvis.  Catheter was sutured to the skin.  Fluoroscopic and ultrasound images were taken and saved for documentation.  Findings:Mild left hydronephrosis.  Left percutaneous nephrostomy tube is in the renal pelvis.  Moderate right hydronephrosis.  Right nephrostomy tube within the renal pelvis.  Complications: None  Impression:Successful placement of bilateral percutaneous nephrostomy tubes with ultrasound and fluoroscopic guidance   Original Report Authenticated By: Richarda Overlie, M.D.     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 Will verify UCX results and then plan for bilateral anterograde nephrostogram / attempt nephroureteral stent. I explained that he may require revision surgery in future if more conservative measures fail.   2 - Bilateral Pulmonary Emboli - Doing well on hep gtt. WIll continue for now, as IR may want to hold with additional procedures before bridiging to Lovenox per hospitalists.    3 - Bacteruria / UTI - Continue empiric therapy, CX pending.    [facility], Genifer Lazenby 03/27/2012

## 2012-03-27 NOTE — Progress Notes (Signed)
ANTICOAGULATION CONSULT NOTE - Follow Up Consult  Pharmacy Consult for IV heparin Indication: pulmonary embolus  No Known Allergies  Patient Measurements: Height: 5\' 11"  (180.3 cm) Weight: 275 lb (124.739 kg) IBW/kg (Calculated) : 75.3 Heparin Dosing Weight: 103 kg  Vital Signs: Temp: 98.2 F (36.8 C) (03/26 0513) Temp src: Oral (03/26 0513) BP: 121/69 mmHg (03/26 0513) Pulse Rate: 76 (03/26 0513)  Labs:  Recent Labs  03/25/12 1655 03/26/12 0430  03/26/12 1703 03/26/12 2253 03/27/12 0456  HGB 14.6 13.5  --   --   --  12.9*  HCT 43.9 40.1  --   --   --  39.2  PLT 256 232  --   --   --  231  APTT 41*  --   --   --   --   --   LABPROT 15.9*  --   --   --   --   --   INR 1.30  --   --   --   --   --   HEPARINUNFRC  --   --   < > 0.34 0.36 0.30  CREATININE 1.09 0.91  --   --   --  0.79  < > = values in this interval not displayed.  Estimated Creatinine Clearance: 122.2 ml/min (by C-G formula based on Cr of 0.79).   Assessment: 25 yom with bladder cancer s/p cystectomy and urinary diversion 02/22/12. Bilateral ureteral stents were removed 03/21/12. Patient seen in office 3/24 with c/o nausea, malaise. CT at office c/w RLQ urinoma and bilateral PE.  IV heparin started early 3/25.   IV heparin currently running at 1900 units/hr and heparin therapeutic x 2 (lower end of therapeutic). No issues, CBC wnl. MD plans transition to Lovenox given h/o malignancy.  Goal of Therapy:  Heparin level 0.3-0.7 units/ml Monitor platelets by anticoagulation protocol: Yes   Plan:   Slightly increase heparin drip to 1950 units/hr.   Daily heparin level and CBC  Follow up transitioning to Lovenox  Geoffry Paradise, PharmD, BCPS Pager: [phone] 9:18 AM Pharmacy #: [phone]

## 2012-03-27 NOTE — Progress Notes (Signed)
Subjective: Pt feels good, a little better each day. Some soreness with movement on left side PCN, none on Right  Objective: Vital signs in last 24 hours: Temp:  [98.2 F (36.8 C)-98.8 F (37.1 C)] 98.2 F (36.8 C) (03/26 0513) Pulse Rate:  [75-84] 76 (03/26 0513) Resp:  [20] 20 (03/26 0513) BP: (117-125)/(69-79) 121/69 mmHg (03/26 0513) SpO2:  [94 %-97 %] 97 % (03/26 0513) Last BM Date: 03/24/12  Intake/Output from previous day: 03/25 0701 - 03/26 0700 In: 2569.1 [P.O.:300; I.V.:2219.1; IV Piggyback:50] Out: 3675 [Urine:3675] Intake/Output this shift: Total I/O In: 240 [P.O.:240] Out: -   (B)PCN's intact, insertion sites clean, NT,  About 1800cc from each side, clear urine no pus.  Lab Results:   Recent Labs  03/26/12 0430 03/27/12 0456  WBC 14.9* 9.9  HGB 13.5 12.9*  HCT 40.1 39.2  PLT 232 231   BMET  Recent Labs  03/26/12 0430 03/27/12 0456  NA 135 137  K 3.4* 3.6  CL 100 102  CO2 25 26  GLUCOSE 147* 140*  BUN 11 7  CREATININE 0.91 0.79  CALCIUM 8.9 8.5   PT/INR  Recent Labs  03/25/12 1655  LABPROT 15.9*  INR 1.30   ABG No results found for this basename: PHART, PCO2, PO2, HCO3,  in the last 72 hours  Studies/Results: Ir Nephrostogram Right  03/26/2012  *RADIOLOGY REPORT*  Clinical history:67 year old with history of bladder cancer and status post cystectomy with an ileal conduit.  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  The patient needs percutaneous nephrostomy tubes to decompress the urinoma.  PROCEDURE(S): PLACEMENT OF BILATERAL PERCUTANEOUS NEPHROSTOMY TUBES WITH ULTRASOUND AND FLUOROSCOPIC GUIDANCE  Physician: Rachelle Hora. Henn, MD  Medications:Versed 3 mg, Fentanyl 150 mcg.  Ciprofloxacin 400 mg IV. A radiology nurse monitored the patient for moderate sedation. Ciprofloxacin was given within two hours of incision.  Moderate sedation time:30 minutes  Fluoroscopy time: 2.9 minutes  Procedure:The procedure was explained  to the patient.  The risks and benefits of the procedure were discussed and the patient's questions were addressed.  Informed consent was obtained from the patient.  The patient was placed prone.  Both flanks were prepped and draped in the sterile fashion.  Maximal barrier sterile technique was utilized including caps, mask, sterile gowns, sterile gloves, sterile drape, hand hygiene and skin antiseptic. Ultrasound demonstrated mild hydronephrosis on the left.  There was contrast within the left renal collecting system by fluoroscopy. The left lower pole collecting system was targeted with a 21 gauge needle using ultrasound and fluoroscopic guidance.  Needle was positioned within the lower pole calix.  A 0.018 wire was placed and an Accustick dilator set was placed.  The tract was dilated to accommodate a 10-French multipurpose drain.  The drain was reconstituted in the renal pelvis.  Catheter was sutured to the skin.  Ultrasound demonstrated moderate right hydronephrosis.  A mid pole calix was targeted with a 21 gauge needle using ultrasound guidance.  Needle was directed into the collecting system and a wire was advanced into the renal pelvis. An Accustick dilator set was placed and the tract was dilated to accommodate a 10-French multipurpose drain.  Catheter was reconstituted in the renal pelvis.  Catheter was sutured to the skin.  Fluoroscopic and ultrasound images were taken and saved for documentation.  Findings:Mild left hydronephrosis.  Left percutaneous nephrostomy tube is in the renal pelvis.  Moderate right hydronephrosis.  Right nephrostomy tube within the renal pelvis.  Complications: None  Impression:Successful placement of bilateral percutaneous nephrostomy tubes with ultrasound and fluoroscopic guidance   Original Report Authenticated By: Richarda Overlie, M.D.    Ir Perc Nephrostomy Left  03/26/2012  *RADIOLOGY REPORT*  Clinical history:67 year old with history of bladder cancer and status post  cystectomy with an ileal conduit.  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  The patient needs percutaneous nephrostomy tubes to decompress the urinoma.  PROCEDURE(S): PLACEMENT OF BILATERAL PERCUTANEOUS NEPHROSTOMY TUBES WITH ULTRASOUND AND FLUOROSCOPIC GUIDANCE  Physician: Rachelle Hora. Henn, MD  Medications:Versed 3 mg, Fentanyl 150 mcg.  Ciprofloxacin 400 mg IV. A radiology nurse monitored the patient for moderate sedation. Ciprofloxacin was given within two hours of incision.  Moderate sedation time:30 minutes  Fluoroscopy time: 2.9 minutes  Procedure:The procedure was explained to the patient.  The risks and benefits of the procedure were discussed and the patient's questions were addressed.  Informed consent was obtained from the patient.  The patient was placed prone.  Both flanks were prepped and draped in the sterile fashion.  Maximal barrier sterile technique was utilized including caps, mask, sterile gowns, sterile gloves, sterile drape, hand hygiene and skin antiseptic. Ultrasound demonstrated mild hydronephrosis on the left.  There was contrast within the left renal collecting system by fluoroscopy. The left lower pole collecting system was targeted with a 21 gauge needle using ultrasound and fluoroscopic guidance.  Needle was positioned within the lower pole calix.  A 0.018 wire was placed and an Accustick dilator set was placed.  The tract was dilated to accommodate a 10-French multipurpose drain.  The drain was reconstituted in the renal pelvis.  Catheter was sutured to the skin.  Ultrasound demonstrated moderate right hydronephrosis.  A mid pole calix was targeted with a 21 gauge needle using ultrasound guidance.  Needle was directed into the collecting system and a wire was advanced into the renal pelvis. An Accustick dilator set was placed and the tract was dilated to accommodate a 10-French multipurpose drain.  Catheter was reconstituted in the renal pelvis.  Catheter was sutured  to the skin.  Fluoroscopic and ultrasound images were taken and saved for documentation.  Findings:Mild left hydronephrosis.  Left percutaneous nephrostomy tube is in the renal pelvis.  Moderate right hydronephrosis.  Right nephrostomy tube within the renal pelvis.  Complications: None  Impression:Successful placement of bilateral percutaneous nephrostomy tubes with ultrasound and fluoroscopic guidance   Original Report Authenticated By: Richarda Overlie, M.D.    Ir US Guide Bx Asp/drain  03/26/2012  *RADIOLOGY REPORT*  Clinical history:67 year old with history of bladder cancer and status post cystectomy with an ileal conduit.  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  The patient needs percutaneous nephrostomy tubes to decompress the urinoma.  PROCEDURE(S): PLACEMENT OF BILATERAL PERCUTANEOUS NEPHROSTOMY TUBES WITH ULTRASOUND AND FLUOROSCOPIC GUIDANCE  Physician: Rachelle Hora. Henn, MD  Medications:Versed 3 mg, Fentanyl 150 mcg.  Ciprofloxacin 400 mg IV. A radiology nurse monitored the patient for moderate sedation. Ciprofloxacin was given within two hours of incision.  Moderate sedation time:30 minutes  Fluoroscopy time: 2.9 minutes  Procedure:The procedure was explained to the patient.  The risks and benefits of the procedure were discussed and the patient's questions were addressed.  Informed consent was obtained from the patient.  The patient was placed prone.  Both flanks were prepped and draped in the sterile fashion.  Maximal barrier sterile technique was utilized including caps, mask, sterile gowns, sterile gloves, sterile drape, hand hygiene and skin antiseptic. Ultrasound demonstrated mild hydronephrosis  on the left.  There was contrast within the left renal collecting system by fluoroscopy. The left lower pole collecting system was targeted with a 21 gauge needle using ultrasound and fluoroscopic guidance.  Needle was positioned within the lower pole calix.  A 0.018 wire was placed and an  Accustick dilator set was placed.  The tract was dilated to accommodate a 10-French multipurpose drain.  The drain was reconstituted in the renal pelvis.  Catheter was sutured to the skin.  Ultrasound demonstrated moderate right hydronephrosis.  A mid pole calix was targeted with a 21 gauge needle using ultrasound guidance.  Needle was directed into the collecting system and a wire was advanced into the renal pelvis. An Accustick dilator set was placed and the tract was dilated to accommodate a 10-French multipurpose drain.  Catheter was reconstituted in the renal pelvis.  Catheter was sutured to the skin.  Fluoroscopic and ultrasound images were taken and saved for documentation.  Findings:Mild left hydronephrosis.  Left percutaneous nephrostomy tube is in the renal pelvis.  Moderate right hydronephrosis.  Right nephrostomy tube within the renal pelvis.  Complications: None  Impression:Successful placement of bilateral percutaneous nephrostomy tubes with ultrasound and fluoroscopic guidance   Original Report Authenticated By: Richarda Overlie, M.D.    Ir US Guide Bx Asp/drain  03/26/2012  *RADIOLOGY REPORT*  Clinical history:67 year old with history of bladder cancer and status post cystectomy with an ileal conduit.  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  The patient needs percutaneous nephrostomy tubes to decompress the urinoma.  PROCEDURE(S): PLACEMENT OF BILATERAL PERCUTANEOUS NEPHROSTOMY TUBES WITH ULTRASOUND AND FLUOROSCOPIC GUIDANCE  Physician: Rachelle Hora. Henn, MD  Medications:Versed 3 mg, Fentanyl 150 mcg.  Ciprofloxacin 400 mg IV. A radiology nurse monitored the patient for moderate sedation. Ciprofloxacin was given within two hours of incision.  Moderate sedation time:30 minutes  Fluoroscopy time: 2.9 minutes  Procedure:The procedure was explained to the patient.  The risks and benefits of the procedure were discussed and the patient's questions were addressed.  Informed consent was  obtained from the patient.  The patient was placed prone.  Both flanks were prepped and draped in the sterile fashion.  Maximal barrier sterile technique was utilized including caps, mask, sterile gowns, sterile gloves, sterile drape, hand hygiene and skin antiseptic. Ultrasound demonstrated mild hydronephrosis on the left.  There was contrast within the left renal collecting system by fluoroscopy. The left lower pole collecting system was targeted with a 21 gauge needle using ultrasound and fluoroscopic guidance.  Needle was positioned within the lower pole calix.  A 0.018 wire was placed and an Accustick dilator set was placed.  The tract was dilated to accommodate a 10-French multipurpose drain.  The drain was reconstituted in the renal pelvis.  Catheter was sutured to the skin.  Ultrasound demonstrated moderate right hydronephrosis.  A mid pole calix was targeted with a 21 gauge needle using ultrasound guidance.  Needle was directed into the collecting system and a wire was advanced into the renal pelvis. An Accustick dilator set was placed and the tract was dilated to accommodate a 10-French multipurpose drain.  Catheter was reconstituted in the renal pelvis.  Catheter was sutured to the skin.  Fluoroscopic and ultrasound images were taken and saved for documentation.  Findings:Mild left hydronephrosis.  Left percutaneous nephrostomy tube is in the renal pelvis.  Moderate right hydronephrosis.  Right nephrostomy tube within the renal pelvis.  Complications: None  Impression:Successful placement of bilateral percutaneous nephrostomy tubes with ultrasound  and fluoroscopic guidance   Original Report Authenticated By: Richarda Overlie, M.D.     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  LOS: 2 days    Brayton El 03/27/2012

## 2012-03-27 NOTE — Progress Notes (Signed)
ANTICOAGULATION CONSULT NOTE - Follow Up Consult  Pharmacy Consult for Heparin Indication: pulmonary embolus  No Known Allergies  Patient Measurements: Height: 5\' 11"  (180.3 cm) Weight: 275 lb (124.739 kg) IBW/kg (Calculated) : 75.3 Heparin Dosing Weight:   Vital Signs: Temp: 98.7 F (37.1 C) (03/25 2102) Temp src: Oral (03/25 2102) BP: 125/79 mmHg (03/25 2102) Pulse Rate: 84 (03/25 2102)  Labs:  Recent Labs  03/25/12 1655 03/26/12 0430 03/26/12 0824 03/26/12 1703 03/26/12 2253  HGB 14.6 13.5  --   --   --   HCT 43.9 40.1  --   --   --   PLT 256 232  --   --   --   APTT 41*  --   --   --   --   LABPROT 15.9*  --   --   --   --   INR 1.30  --   --   --   --   HEPARINUNFRC  --   --  0.14* 0.34 0.36  CREATININE 1.09 0.91  --   --   --     Estimated Creatinine Clearance: 107.4 ml/min (by C-G formula based on Cr of 0.91).   Medications:  Infusions:  . dextrose 5 % and 0.45 % NaCl with KCl 10 mEq/L 75 mL/hr (03/26/12 2107)  . heparin 1,900 Units/hr (03/26/12 2107)  . [DISCONTINUED] heparin 1,600 Units/hr (03/26/12 0040)    Assessment: Patient with 2nd heparin level at goal.  No issues noted.  Goal of Therapy:  Heparin level 0.3-0.7 units/ml Monitor platelets by anticoagulation protocol: Yes   Plan:  Continue heparin at current rate.  follow up with am heparin level.   Darlina Guys, Jacquenette Shone Crowford 03/27/2012,2:34 AM

## 2012-03-27 NOTE — Progress Notes (Signed)
Patient ID: Chad Olson, male   DOB: [birth year], 67 y.o.   MRN: [id number]  [facility] Consult PROGRESS NOTE  Chad Olson:[id number] DOB: December 26, 1945 DOA: 03/25/2012 PCP: [name], MD  Brief narrative:  Pt is 67 yo male who presented with progressively worsening generalized malaise associated with poor oral intake, lower right quadrant pain, and nausea, low grade fever ~ 99.5 F, initially started 3 days prior to admission. Of note, he has had removal of bilateral ureteral stents in the urology office 03/21/2012. Pt admitted under urology service and [facility] consulted as bilateral pulmonary emboli noted on CT angio. Pt currently denies chest pain or shortness of breath, no specific new abdominal or urinary concerns, no other systemic symptoms. Also understood the venous duplex was positive for acute DVT in the right leg.    Principal Problem:    UTI, bacteriuria  - urine culture pending at this time - Rocephin and Ciprofloxacin as per primary team day #2    Nausea, malaise, urinoma  - per primary team, status post placement of bilateral percutaneous nephrostomy tubes post op day #1     Acute pulmonary embolism and right leg DVT - continue heparin infusion, plan on transition to Lovenox given history of bladder cancer  , discussed with Dr. Berneice Heinrich patient's primary urologist and attending physician in the hospital along with oncologist Dr. Clelia Croft patient will be better served with long-term Lovenox due to his underlying history of bladder cancer as preferred anticoagulation agent, patient will be transitioned to Lovenox after his surgical procedures are done on Friday.     Hypokalemia - mild, will supplement, BMP in AM   Bladder cancer, Bilateral renal cysts  - s/p robotic cystoprostatectomy with intracorporeal ileal conduit urinary diversion 02/22/2012  - per primary team        Leukocytosis, unspecified  - likely secondary to principal problem UTI, WBC  trending down  - continue ABX as noted above      HTN (hypertension)  - reasonable inpatient control off meds      Procedures/Studies:   Ir US Guide Bx Asp/drain 03/26/2012 Successful placement of bilateral percutaneous nephrostomy tubes with ultrasound and fluoroscopic guidance     CTA in Uro office - +ve for PE per Dr Berneice Heinrich  [place] legs - R.DVT    Antibiotics:  Ciprofloxacin 03/24 -->  Rocephin 03/24 -->  Code Status: Full Family Communication: Pt and family at bedside Disposition Plan: Home when medically stable  HPI/Subjective: No events overnight.   Objective: Filed Vitals:   03/26/12 0644 03/26/12 1500 03/26/12 2102 03/27/12 0513  BP: 133/78 117/71 125/79 121/69  Pulse: 94 75 84 76  Temp: 98.8 F (37.1 C) 98.8 F (37.1 C) 98.7 F (37.1 C) 98.2 F (36.8 C)  TempSrc: Oral Oral Oral Oral  Resp: 20 20 20 20   Height:      Weight:      SpO2: 94% 95% 94% 97%    Intake/Output Summary (Last 24 hours) at 03/27/12 1159 Last data filed at 03/27/12 1108  Gross per 24 hour  Intake 2689.05 ml  Output   3850 ml  Net -1160.95 ml    Exam:   General:  Pt is alert, follows commands appropriately, not in acute distress  Cardiovascular: Regular rate and rhythm, S1/S2, no murmurs, no rubs, no gallops  Respiratory: Clear to auscultation bilaterally, no wheezing, no crackles, no rhonchi  Abdomen: Soft, non tender, non distended, bowel sounds present, no guarding  Extremities: No edema, pulses DP  and PT palpable bilaterally  Neuro: Grossly nonfocal  Data Reviewed: Basic Metabolic Panel:  Recent Labs Lab 03/25/12 1655 03/26/12 0430 03/27/12 0456  NA 134* 135 137  K 4.1 3.4* 3.6  CL 95* 100 102  CO2 28 25 26   GLUCOSE 136* 147* 140*  BUN 15 11 7   CREATININE 1.09 0.91 0.79  CALCIUM 9.6 8.9 8.5   CBC:  Recent Labs Lab 03/25/12 1655 03/26/12 0430 03/27/12 0456  WBC 20.1* 14.9* 9.9  NEUTROABS 17.5*  --   --   HGB 14.6 13.5 12.9*  HCT 43.9 40.1  39.2  MCV 94.6 93.3 93.6  PLT 256 232 231    Scheduled Meds: . cefTRIAXone (ROCEPHIN)  IV  1 g Intravenous Q24H  . docusate sodium  100 mg Oral BID  . feeding supplement  237 mL Oral BID BM  . pantoprazole  40 mg Oral Daily  . senna  1 tablet Oral BID   Continuous Infusions: . dextrose 5 % and 0.45 % NaCl with KCl 10 mEq/L 30 mL/hr at 03/27/12 0831  . heparin 1,950 Units/hr (03/27/12 0930)     Leroy Sea, MD  [facility] Pager [phone]  If 7PM-7AM, please contact night-coverage [email or web address] Password [email or web address] 03/27/2012, 11:59 AM   LOS: 2 days

## 2012-03-27 NOTE — Progress Notes (Signed)
*  Preliminary Results* Bilateral lower extremity venous duplex completed. The right lower extremity is positive for deep vein thrombosis involving the right posterior tibial and peroneal veins. There is no evidence of left lower extremity deep vein thrombosis. Preliminary results discussed with Irving Burton, RN.  03/27/2012 10:37 AM Gertie Fey, RDMS, RDCS

## 2012-03-27 NOTE — Evaluation (Signed)
Physical Therapy Evaluation Patient Details Name: Chad Olson MRN: [id number] DOB: 20-Mar-1945 Today's Date: 03/27/2012 Time: 3086-5784 PT Time Calculation (min): 13 min  PT Assessment / Plan / Recommendation Clinical Impression  Pt admitted for nausea, malaise, urinoma, UTI and found to have acute bilateral PEs as well as R LE DVT.  Pt would benefit from acute PT services in order to improve independence with transfers and ambulation by increasing activity tolerance and overall strength to prepare for d/c home.  Pt and spouse report active with [facility].    PT Assessment  Patient needs continued PT services    Follow Up Recommendations  Home health PT    Does the patient have the potential to tolerate intense rehabilitation      Barriers to Discharge        Equipment Recommendations  None recommended by PT    Recommendations for Other Services     Frequency Min 3X/week    Precautions / Restrictions Precautions Precautions: Fall Precaution Comments: 2 drains   Pertinent Vitals/Pain Pt premedicated, checked on pt first time and still in pain however returned shortly after and pt feeling better and agreeable to ambulate     Mobility  Bed Mobility Bed Mobility: Supine to Sit;Sit to Supine Supine to Sit: 4: Min assist Sit to Supine: 5: Set up;HOB elevated Details for Bed Mobility Assistance: pt givens hands to assist in pulling trunk upright, assisted given for managing lines/drains Transfers Transfers: Sit to Stand;Stand to Sit Sit to Stand: 4: Min assist;With upper extremity assist;From bed Stand to Sit: 4: Min guard;With upper extremity assist;To bed Details for Transfer Assistance: assist to rise and steady Ambulation/Gait Ambulation/Gait Assistance: 4: Min guard Ambulation Distance (Feet): 160 Feet Assistive device: Rolling walker Ambulation/Gait Assistance Details: verbal cues for safe RW distance and posture Gait Pattern: Step-through  pattern;Decreased stride length;Trunk flexed General Gait Details: SaO2 97% room air upon return to sitting EOB after ambulation, pt denies SOB    Exercises     PT Diagnosis: Difficulty walking;Acute pain  PT Problem List: Decreased strength;Decreased activity tolerance;Decreased mobility;Pain;Decreased knowledge of use of DME PT Treatment Interventions: Gait training;DME instruction;Functional mobility training;Therapeutic activities;Therapeutic exercise;Stair training;Patient/family education   PT Goals Acute Rehab PT Goals PT Goal Formulation: With patient Time For Goal Achievement: 04/03/12 Potential to Achieve Goals: Good Pt will go Supine/Side to Sit: with modified independence PT Goal: Supine/Side to Sit - Progress: Goal set today Pt will go Sit to Stand: with modified independence PT Goal: Sit to Stand - Progress: Goal set today Pt will go Stand to Sit: with modified independence PT Goal: Stand to Sit - Progress: Goal set today Pt will Ambulate: >150 feet;with supervision;with least restrictive assistive device PT Goal: Ambulate - Progress: Goal set today Pt will Perform Home Exercise Program: with supervision, verbal cues required/provided PT Goal: Perform Home Exercise Program - Progress: Goal set today  Visit Information  Last PT Received On: 03/27/12 Assistance Needed: +1    Subjective Data  Subjective: Alright, let's do this.  (premedicated earlier and motivated to walk after checking back on pt)   Prior Functioning  Home Living Lives With: Spouse Available Help at Discharge: Family Type of Home: House Home Access: Stairs to enter Secretary/administrator of Steps: 3 Entrance Stairs-Rails: Right Home Layout: One level Home Adaptive Equipment: Walker - rolling;Crutches Prior Function Level of Independence: Independent Communication Communication: No difficulties    Cognition  Cognition Overall Cognitive Status: Appears within functional limits for tasks  assessed/performed  Arousal/Alertness: Awake/alert Orientation Level: Appears intact for tasks assessed Behavior During Session: [facility] for tasks performed    Extremity/Trunk Assessment Right Lower Extremity Assessment RLE ROM/Strength/Tone: [facility] for tasks assessed Left Lower Extremity Assessment LLE ROM/Strength/Tone: [facility] for tasks assessed   Balance    End of Session PT - End of Session Activity Tolerance: Patient tolerated treatment well Patient left: in bed;with call bell/phone within reach;with family/visitor present  GP     Keisi Eckford,KATHrine E 03/27/2012, 4:06 PM Zenovia Jarred, PT, DPT 03/27/2012 Pager: [phone]

## 2012-03-28 ENCOUNTER — Inpatient Hospital Stay (HOSPITAL_COMMUNITY): Payer: BC Managed Care – PPO

## 2012-03-28 DIAGNOSIS — I10 Essential (primary) hypertension: Secondary | ICD-10-CM

## 2012-03-28 LAB — URINE CULTURE: Colony Count: >=100000

## 2012-03-28 LAB — HEPARIN LEVEL (UNFRACTIONATED): Heparin Unfractionated: 0.31 IU/mL (ref 0.30–0.70)

## 2012-03-28 MED ORDER — HEPARIN (PORCINE) IN NACL 100-0.45 UNIT/ML-% IJ SOLN
1950.0000 [IU]/h | INTRAMUSCULAR | Status: DC
  Administered 2012-03-29: 1950 [IU]/h via INTRAVENOUS

## 2012-03-28 MED ORDER — LIDOCAINE HCL 1 % IJ SOLN
INTRAMUSCULAR | Status: AC
  Filled 2012-03-28: qty 20

## 2012-03-28 MED ORDER — CIPROFLOXACIN IN D5W 400 MG/200ML IV SOLN: 400.0000 mg | INTRAVENOUS | Status: DC

## 2012-03-28 MED ORDER — SODIUM CHLORIDE 0.9 % IV SOLN
1250.0000 mg | Freq: Two times a day (BID) | INTRAVENOUS | Status: DC
  Administered 2012-03-29: 1250 mg via INTRAVENOUS
  Filled 2012-03-28 (×2): qty 1250

## 2012-03-28 MED ORDER — SODIUM CHLORIDE 0.9 % IV SOLN
2000.0000 mg | Freq: Once | INTRAVENOUS | Status: AC
  Administered 2012-03-28: 2000 mg via INTRAVENOUS
  Filled 2012-03-28: qty 2000

## 2012-03-28 MED ORDER — IOHEXOL 300 MG/ML  SOLN: 50.0000 mL | Freq: Once | INTRAMUSCULAR | Status: AC | PRN

## 2012-03-28 MED ORDER — MIDAZOLAM HCL 2 MG/2ML IJ SOLN
INTRAMUSCULAR | Status: AC | PRN
  Administered 2012-03-28 (×3): 0.5 mg via INTRAVENOUS

## 2012-03-28 MED ORDER — FENTANYL CITRATE 0.05 MG/ML IJ SOLN
INTRAMUSCULAR | Status: AC | PRN
  Administered 2012-03-28 (×2): 25 ug via INTRAVENOUS
  Administered 2012-03-28 (×2): 50 ug via INTRAVENOUS

## 2012-03-28 NOTE — Progress Notes (Signed)
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 Directly admitted and underwent urgent bilateral nephrostomy by IR 3/24. Plan for repeat nephrostogram later today with hopeful attempt left nephroureteral stent.  2 - Bilateral Pulmonary Emboli - Incidental on CT imaging for post-op malaise. Now on heparin gtt per hospitalist service, appreciate. No s/s excess bleeding. No CP/SOB.   3 - Bacteruria / UTI - Pt with low-grade fevers and bacteruria at presentation. On emperic Cipro + Rocephin / UCX pending. Afebrile now.   Today Chad Olson is awaiting IR procedure later today. His heparain gtt is held and he is NPO for this. No interval fevers. Pain controlled.   Objective: Vital signs in last 24 hours: Temp:  [98.3 F (36.8 C)-98.9 F (37.2 C)] 98.9 F (37.2 C) (03/27 1320) Pulse Rate:  [66-81] 72 (03/27 1320) Resp:  [18] 18 (03/27 1320) BP: (118-127)/(68-83) 120/68 mmHg (03/27 1320) SpO2:  [96 %-98 %] 98 % (03/27 1320) Last BM Date: 03/27/12  Intake/Output from previous day: 03/26 0701 - 03/27 0700 In: 1934.8 [P.O.:720; I.V.:1214.8] Out: 4136 [Urine:4135; Stool:1] Intake/Output this shift: Total I/O In: 115.4 [I.V.:115.4] Out: 850 [Urine:850]  General appearance: alert, cooperative and appears stated age Head: Normocephalic, without obvious abnormality, atraumatic Eyes: conjunctivae/corneas clear. PERRL, EOM's intact. Fundi benign. Ears: normal TM's and external ear canals both ears Nose: Nares normal. Septum midline. Mucosa normal. No drainage or sinus tenderness. Throat: lips, mucosa, and tongue normal;  teeth and gums normal Neck: no adenopathy, no carotid bruit, no JVD, supple, symmetrical, trachea midline and thyroid not enlarged, symmetric, no tenderness/mass/nodules Back: symmetric, no curvature. ROM normal. No CVA tenderness. Resp: clear to auscultation bilaterally and normal percussion bilaterally Chest wall: no tenderness Cardio: regular rate and rhythm, S1, S2 normal, no murmur, click, rub or gallop GI: soft, non-tender; bowel sounds normal; no masses,  no organomegaly Male genitalia: normal Pelvic: N/A Extremities: extremities normal, atraumatic, no cyanosis or edema Pulses: 2+ and symmetric Skin: Skin color, texture, turgor normal. No rashes or lesions Lymph nodes: Cervical, supraclavicular, and axillary nodes normal. Neurologic: Grossly normal Incision/Wound: bilat neph tubes c/d/i with clear urine. ursotomy pink / patent.   Lab Results:   Recent Labs  03/26/12 0430 03/27/12 0456  WBC 14.9* 9.9  HGB 13.5 12.9*  HCT 40.1 39.2  PLT 232 231   BMET  Recent Labs  03/26/12 0430 03/27/12 0456  NA 135 137  K 3.4* 3.6  CL 100 102  CO2 25 26  GLUCOSE 147* 140*  BUN 11 7  CREATININE 0.91 0.79  CALCIUM 8.9 8.5   PT/INR  Recent Labs  03/25/12 1655  LABPROT 15.9*  INR 1.30   ABG No results found for this basename: PHART, PCO2, PO2, HCO3,  in the last 72 hours  Studies/Results: No results found.  Anti-infectives: Anti-infectives   Start     Dose/Rate Route Frequency Ordered Stop   03/28/12 0943  ciprofloxacin (CIPRO) IVPB 400 mg     400 mg 200 mL/hr over 60 Minutes Intravenous 60 min pre-op 03/28/12 0940     03/25/12 2030  ciprofloxacin (CIPRO) IVPB 400 mg     400 mg 200 mL/hr over 60 Minutes Intravenous On call 03/25/12  1936 03/25/12 2050   03/25/12 1700  cefTRIAXone (ROCEPHIN) 1 g in dextrose 5 % 50 mL IVPB     1 g 100 mL/hr over 30 Minutes Intravenous Every 24 hours 03/25/12 1636        Assessment/Plan:   1 - Nausea, Malaise, Urinoma After  Cystectomy for Bladder Cancer -  Plan for repeat nephrostogram later today with hopeful attempt left nephroureteral stent.  2 - Bilateral Pulmonary Emboli - Appreciate hospitalists help with management.   3 - Bacteruria / UTI - Infectious parameters now resolved. Final CX still pending. Continue ABX in interim.  [facility], Chad Olson 03/28/2012

## 2012-03-28 NOTE — Progress Notes (Addendum)
ANTICOAGULATION CONSULT NOTE - Follow Up Consult  Pharmacy Consult for IV heparin Indication: pulmonary embolus  No Known Allergies  Patient Measurements: Height: 5\' 11"  (180.3 cm) Weight: 275 lb (124.739 kg) IBW/kg (Calculated) : 75.3 Heparin Dosing Weight: 103 kg  Vital Signs: Temp: 98.6 F (37 C) (03/27 0625) Temp src: Oral (03/27 0625) BP: 127/79 mmHg (03/27 0625) Pulse Rate: 66 (03/27 0625)  Labs:  Recent Labs  03/25/12 1655 03/26/12 0430  03/26/12 2253 03/27/12 0456 03/28/12 0425  HGB 14.6 13.5  --   --  12.9*  --   HCT 43.9 40.1  --   --  39.2  --   PLT 256 232  --   --  231  --   APTT 41*  --   --   --   --   --   LABPROT 15.9*  --   --   --   --   --   INR 1.30  --   --   --   --   --   HEPARINUNFRC  --   --   < > 0.36 0.30 0.31  CREATININE 1.09 0.91  --   --  0.79  --   < > = values in this interval not displayed.  Estimated Creatinine Clearance: 122.2 ml/min (by C-G formula based on Cr of 0.79).   Assessment: 88 yom with bladder cancer s/p cystectomy and urinary diversion 02/22/12. Bilateral ureteral stents were removed 03/21/12. Patient seen in office 3/24 with c/o nausea, malaise. CT at office c/w RLQ urinoma and bilateral PE.  IV heparin started early 3/25.   IV heparin currently running at 1950 units/hr and AM heparin level therapeutic.    MD notes that patient's urine had changed in color from right nephrostomy tube to bloody with few clots today.  Plan for bilateral ureteral stents placement today.  IV heparin to be continued and then will be held 2 hours prior to procedure this evening.  RN aware.  Goal of Therapy:  Heparin level 0.3-0.7 units/ml Monitor platelets by anticoagulation protocol: Yes   Plan:   Continue heparin drip at 1950 units/hr.   Follow up resumption of anticoagulation following procedure.  MD noted plan to transition to Lovenox given h/o malignancy.  Clance Boll, PharmD, BCPS Pager: [phone] 03/28/2012 11:44  AM
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
## 2012-03-28 NOTE — Progress Notes (Signed)
Patient ID: Chad Olson, male   DOB: 04/13/1945, 67 y.o.   MRN: [id number]  [facility] Consult PROGRESS NOTE  Chad Olson WUJ:[id number] DOB: 1945-05-10 DOA: 03/25/2012 PCP: [name], MD  Brief narrative:  Pt is 67 yo male who presented with progressively worsening generalized malaise associated with poor oral intake, lower right quadrant pain, and nausea, low grade fever ~ 99.5 F, initially started 3 days prior to admission. Of note, he has had removal of bilateral ureteral stents in the urology office 03/21/2012. Pt admitted under urology service and [facility] consulted as bilateral pulmonary emboli noted on CT angio. Pt currently denies chest pain or shortness of breath, no specific new abdominal or urinary concerns, no other systemic symptoms. Also understood the venous duplex was positive for acute DVT in the right leg.    Principal Problem:    UTI, bacteriuria  - urine culture pending at this time - Rocephin and Ciprofloxacin as per primary team     Acute pulmonary embolism and right leg DVT - continue heparin infusion, plan on transition to Lovenox given history of bladder cancer  , discussed with Dr. Berneice Heinrich patient's primary urologist and attending physician in the hospital along with oncologist Dr. Clelia Croft patient will be better served with long-term Lovenox due to his underlying history of bladder cancer as preferred anticoagulation agent, patient will be transitioned to Lovenox after his surgical procedures are done on Friday.     Hypokalemia - mild, will supplement, BMP in AM    Bladder cancer, Bilateral renal cysts  - s/p robotic cystoprostatectomy with intracorporeal ileal conduit urinary diversion 02/22/2012  - now Bilat Perc Neph tubes by IR (03-25-12) being managed by IR and Primary Urology team       Leukocytosis, unspecified  - likely secondary to principal problem UTI, now resolved - continue ABX per Prim team.     HTN (hypertension)  -  reasonable inpatient control off meds      Procedures/Studies:   Ir US Guide Bx Asp/drain 03/26/2012 Successful placement of bilateral percutaneous nephrostomy tubes with ultrasound and fluoroscopic guidance     CTA in Uro office - +ve for PE per Dr Berneice Heinrich  [place] legs - R.DVT    Antibiotics:  Ciprofloxacin 03/24 -->  Rocephin 03/24 -->  Code Status: Full Family Communication: Pt and family at bedside Disposition Plan: Home when medically stable  HPI/Subjective: No events overnight.   Objective: Filed Vitals:   03/27/12 0513 03/27/12 1400 03/27/12 2147 03/28/12 0625  BP: 121/69 118/76 118/83 127/79  Pulse: 76 88 81 66  Temp: 98.2 F (36.8 C) 98.3 F (36.8 C) 98.3 F (36.8 C) 98.6 F (37 C)  TempSrc: Oral Oral Oral Oral  Resp: 20 18 18 18   Height:      Weight:      SpO2: 97% 96% 97% 96%    Intake/Output Summary (Last 24 hours) at 03/28/12 9562 Last data filed at 03/28/12 0700  Gross per 24 hour  Intake 1934.8 ml  Output   4136 ml  Net -2201.2 ml    Exam:   General:  Pt is alert, follows commands appropriately, not in acute distress  Cardiovascular: Regular rate and rhythm, S1/S2, no murmurs, no rubs, no gallops  Respiratory: Clear to auscultation bilaterally, no wheezing, no crackles, no rhonchi  Abdomen: Soft, non tender, non distended, bowel sounds present, no guarding, bilat Perc Neph tubes  Extremities: No edema, pulses DP and PT palpable bilaterally  Neuro: Grossly nonfocal  Data Reviewed:  Basic Metabolic Panel:  Recent Labs Lab 03/25/12 1655 03/26/12 0430 03/27/12 0456  NA 134* 135 137  K 4.1 3.4* 3.6  CL 95* 100 102  CO2 28 25 26   GLUCOSE 136* 147* 140*  BUN 15 11 7   CREATININE 1.09 0.91 0.79  CALCIUM 9.6 8.9 8.5   CBC:  Recent Labs Lab 03/25/12 1655 03/26/12 0430 03/27/12 0456  WBC 20.1* 14.9* 9.9  NEUTROABS 17.5*  --   --   HGB 14.6 13.5 12.9*  HCT 43.9 40.1 39.2  MCV 94.6 93.3 93.6  PLT 256 232 231    Scheduled  Meds: . cefTRIAXone (ROCEPHIN)  IV  1 g Intravenous Q24H  . docusate sodium  100 mg Oral BID  . feeding supplement  237 mL Oral BID BM  . pantoprazole  40 mg Oral Daily  . senna  1 tablet Oral BID   Continuous Infusions: . dextrose 5 % and 0.45 % NaCl with KCl 10 mEq/L 30 mL/hr at 03/27/12 0831  . heparin 1,950 Units/hr (03/27/12 0930)     Leroy Sea, MD  [facility] Pager [phone]  If 7PM-7AM, please contact night-coverage [email or web address] Password [facility] 03/28/2012, 8:24 AM   LOS: 3 days

## 2012-03-28 NOTE — Progress Notes (Signed)
  Subjective: Pt doing ok; has noted change in color of urine from right nephrostomy tube to bloody with few clots today  Objective: Vital signs in last 24 hours: Temp:  [98.3 F (36.8 C)-98.6 F (37 C)] 98.6 F (37 C) (03/27 0625) Pulse Rate:  [66-88] 66 (03/27 0625) Resp:  [18] 18 (03/27 0625) BP: (118-127)/(76-83) 127/79 mmHg (03/27 0625) SpO2:  [96 %-97 %] 96 % (03/27 0625) Last BM Date: 03/27/12  Intake/Output from previous day: 03/26 0701 - 03/27 0700 In: 1934.8 [P.O.:720; I.V.:1214.8] Out: 4136 [Urine:4135; Stool:1] Intake/Output this shift: Total I/O In: -  Out: 400 [Urine:400]  Chest- CTA bilat; heart- RRR; bilat PCN'S intact, outputs about 200 cc's from each this am; bloody urine with few clots in bag on right; left PCN with tea colored urine; urine cx's pending  Lab Results:   Recent Labs  03/26/12 0430 03/27/12 0456  WBC 14.9* 9.9  HGB 13.5 12.9*  HCT 40.1 39.2  PLT 232 231   BMET  Recent Labs  03/26/12 0430 03/27/12 0456  NA 135 137  K 3.4* 3.6  CL 100 102  CO2 25 26  GLUCOSE 147* 140*  BUN 11 7  CREATININE 0.91 0.79  CALCIUM 8.9 8.5   PT/INR  Recent Labs  03/25/12 1655  LABPROT 15.9*  INR 1.30   Results for orders placed during the hospital encounter of 03/25/12  URINE CULTURE     Status: None   Collection Time    03/25/12  9:45 PM      Result Value Range Status   Specimen Description URINE, RANDOM   Final   Special Requests Normal   Final   Culture  Setup Time 03/26/2012 05:28   Final   Colony Count PENDING   Incomplete   Culture Culture reincubated for better growth   Final   Report Status PENDING   Incomplete    ABG No results found for this basename: PHART, PCO2, PO2, HCO3,  in the last 72 hours  Studies/Results: No results found.  Anti-infectives: Anti-infectives   Start     Dose/Rate Route Frequency Ordered Stop   03/25/12 2030  ciprofloxacin (CIPRO) IVPB 400 mg     400 mg 200 mL/hr over 60 Minutes Intravenous  On call 03/25/12 1936 03/25/12 2050   03/25/12 1700  cefTRIAXone (ROCEPHIN) 1 g in dextrose 5 % 50 mL IVPB     1 g 100 mL/hr over 30 Minutes Intravenous Every 24 hours 03/25/12 1636        Assessment/Plan: Bladder CA- S/p bilateral PCN's 3/24 secondary to bilat hydro/possible urinoma at left ureter anast with ileal conduit; tent plan is for bilat nephrostograms with attempt at bilat ureteral stent(s) placement today; will hold IV heparin 2 hours preprocedure; details/risks of above d/w pt/family with their understanding and consent. Dr. Berneice Heinrich aware of plans.  LOS: 3 days    Jameon Deller,D [facility] 03/28/2012

## 2012-03-28 NOTE — Procedures (Signed)
Procedure:  Bilateral nephrostograms, left ureteral stenting and bilateral PCN exchange. Findings:  Left nephrostogram shows irregular leak of contrast from distal ureter appearing to be outside of ileal conduit lumen.  8 Fr x 21 cm ureteral stent placed and formed in conduit distally and renal pelvis proximally.  10 Fr PCN also placed over wire to left renal pelvis and capped to trial internal drainage via JJ stent. Right nephrostogram shows widely patent right ureter without visible leak or stricture.  Good flow into conduit and visualized reflux.  Given appearance, elected to replace PCN with new 10 Fr PCN which will be capped to trial internal drainage without stent.

## 2012-03-29 LAB — CBC
HCT: 41.3 % (ref 39.0–52.0)
Hemoglobin: 13.4 g/dL (ref 13.0–17.0)
MCV: 93.9 fL (ref 78.0–100.0)
RDW: 13.2 % (ref 11.5–15.5)
WBC: 8 10*3/uL (ref 4.0–10.5)

## 2012-03-29 LAB — HEPARIN LEVEL (UNFRACTIONATED): Heparin Unfractionated: 0.3 IU/mL (ref 0.30–0.70)

## 2012-03-29 MED ORDER — NITROFURANTOIN MONOHYD MACRO 100 MG PO CAPS: 100.0000 mg | ORAL_CAPSULE | Freq: Every day | ORAL | Status: DC

## 2012-03-29 MED ORDER — ENOXAPARIN SODIUM 120 MG/0.8ML ~~LOC~~ SOLN
120.0000 mg | Freq: Two times a day (BID) | SUBCUTANEOUS | Status: DC
  Administered 2012-03-29 (×2): 120 mg via SUBCUTANEOUS
  Filled 2012-03-29 (×2): qty 0.8

## 2012-03-29 MED ORDER — ENOXAPARIN (LOVENOX) PATIENT EDUCATION KIT: 1.0000 | PACK | Freq: Once | Status: DC

## 2012-03-29 MED ORDER — ENOXAPARIN SODIUM 120 MG/0.8ML ~~LOC~~ SOLN: 120.0000 mg | Freq: Two times a day (BID) | SUBCUTANEOUS | Status: DC

## 2012-03-29 MED ORDER — ENOXAPARIN (LOVENOX) PATIENT EDUCATION KIT
PACK | Freq: Once | Status: DC
  Filled 2012-03-29: qty 1

## 2012-03-29 MED ORDER — HYDROCODONE-ACETAMINOPHEN 5-325 MG PO TABS: 1.0000 | ORAL_TABLET | ORAL | Status: DC | PRN

## 2012-03-29 NOTE — Progress Notes (Addendum)
Patient ID: Chad Olson, male   DOB: 04/15/1945, 67 y.o.   MRN: [id number]  [facility] Consult PROGRESS NOTE    Hospitalist team will signoff please call with any questions.    KAREN KINNARD WUJ:[id number] DOB: 1945/09/30 DOA: 03/25/2012 PCP: [name], MD  Brief narrative:  Pt is 67 yo male who presented with progressively worsening generalized malaise associated with poor oral intake, lower right quadrant pain, and nausea, low grade fever ~ 99.5 F, initially started 3 days prior to admission. Of note, he has had removal of bilateral ureteral stents in the urology office 03/21/2012. Pt admitted under urology service and [facility] consulted as bilateral pulmonary emboli noted on CT angio. Pt currently denies chest pain or shortness of breath, no specific new abdominal or urinary concerns, no other systemic symptoms. Also understood the venous duplex was positive for acute DVT in the right leg.    Principal Problem:    UTI, bacteriuria  -  ABX as per primary team     Acute pulmonary embolism and right leg DVT - safe for Lovenox now per urology, will stop heparin infusion, discussed with Dr. Berneice Heinrich patient's primary urologist and attending physician in the hospital along with oncologist Dr. Clelia Croft patient will be better served with long-term Lovenox due to his underlying history of bladder cancer as preferred anticoagulation agent, patient will be transitioned to Lovenox on 03-29-12, teaching ordered too.   He should go home on longterm Lovenox 120mg  SQ BID, 1 time Ha[facility] Onco follow up for longterm anticoagulation follow up.   Follow-up Information   Follow up with [name], MD. Schedule an appointment as soon as possible for a visit in 1 week.   Contact information:   [address] [place] [address] [phone]       Follow up with [facility], MD. Schedule an appointment as soon as possible for a visit in 1 week. (for  longetrm PE treatment)    Contact information:   [address] [place] [address] [phone]         Hospitalist team will signoff please call with any questions.       Hypokalemia - mild, resolved post supplement     Bladder cancer, Bilateral renal cysts  - s/p robotic cystoprostatectomy with intracorporeal ileal conduit urinary diversion 02/22/2012  - now Bilat Perc Neph tubes by IR (03-25-12) being managed by IR and Primary Urology team       Leukocytosis, unspecified  - likely secondary to principal problem UTI, now resolved,  continue ABX per Prim team.     HTN (hypertension)  - reasonable inpatient control off meds      Procedures/Studies:   Ir US Guide Bx Asp/drain 03/26/2012 Successful placement of bilateral percutaneous nephrostomy tubes with ultrasound and fluoroscopic guidance, thereafter Both Perc.N.tubes capped with L.JJ stenting.     CTA in Uro office - +ve for PE per Dr Berneice Heinrich  [place] legs - R.DVT    Antibiotics:  Ciprofloxacin 03/24 -->  Rocephin 03/24 -->  Code Status: Full Family Communication: Pt and family at bedside Disposition Plan: Home when medically stable  HPI/Subjective: No events overnight.   Objective: Filed Vitals:   03/28/12 1806 03/28/12 1811 03/28/12 2035 03/29/12 0523  BP: 127/86 137/92 115/78 129/79  Pulse: 81 76 91 79  Temp:   98.9 F (37.2 C) 98.4 F (36.9 C)  TempSrc:   Oral Oral  Resp: 20 14 20 20   Height:  Weight:      SpO2: 95% 96% 96% 96%    Intake/Output Summary (Last 24 hours) at 03/29/12 1302 Last data filed at 03/29/12 1236  Gross per 24 hour  Intake   1090 ml  Output   2600 ml  Net  -1510 ml    Exam:   General:  Pt is alert, follows commands appropriately, not in acute distress  Cardiovascular: Regular rate and rhythm, S1/S2, no murmurs, no rubs, no gallops  Respiratory: Clear to auscultation bilaterally, no wheezing, no crackles, no rhonchi  Abdomen: Soft, non tender, non  distended, bowel sounds present, no guarding,    Extremities: No edema, pulses DP and PT palpable bilaterally  Neuro: Grossly nonfocal  Data Reviewed: Basic Metabolic Panel:  Recent Labs Lab 03/25/12 1655 03/26/12 0430 03/27/12 0456  NA 134* 135 137  K 4.1 3.4* 3.6  CL 95* 100 102  CO2 28 25 26   GLUCOSE 136* 147* 140*  BUN 15 11 7   CREATININE 1.09 0.91 0.79  CALCIUM 9.6 8.9 8.5   CBC:  Recent Labs Lab 03/25/12 1655 03/26/12 0430 03/27/12 0456 03/29/12 0525  WBC 20.1* 14.9* 9.9 8.0  NEUTROABS 17.5*  --   --   --   HGB 14.6 13.5 12.9* 13.4  HCT 43.9 40.1 39.2 41.3  MCV 94.6 93.3 93.6 93.9  PLT 256 232 231 303    Scheduled Meds: . docusate sodium  100 mg Oral BID  . enoxaparin (LOVENOX) injection  120 mg Subcutaneous Q12H  . feeding supplement  237 mL Oral BID BM  . pantoprazole  40 mg Oral Daily  . senna  1 tablet Oral BID  . vancomycin  1,250 mg Intravenous Q12H   Continuous Infusions: . dextrose 5 % and 0.45 % NaCl with KCl 10 mEq/L 30 mL/hr at 03/27/12 0831     Leroy Sea, MD  [facility] Pager [phone]  If 7PM-7AM, please contact night-coverage [email or web address] Password [email or web address] 03/29/2012, 1:02 PM   LOS: 4 days

## 2012-03-29 NOTE — Progress Notes (Signed)
CRITICAL VALUE ALERT  Critical value received:  Catheterized urine culture with over 100,000 colonies of MRSA  Date of notification:  03/28/2012  Time of notification:  2045  Critical value read back:yes  Nurse who received alert:  Silvestre Gunner, RN  MD notified (1st page):  Denna Haggard, NP  Time of first page:  0900  MD notified (2nd page):  Time of second page:  Responding MD:  Kerrin Champagne, MD  Time MD responded:  [phone]  By the time the NP on call responded, the primary doctor for this patient Berneice Heinrich, MD) had already entered orders for this patient regarding the results.

## 2012-03-29 NOTE — Care Management Note (Signed)
    Page 1 of 1   03/29/2012     11:58:50 AM   CARE MANAGEMENT NOTE 03/29/2012  Patient:  Chad Olson, Chad Olson   Account Number:  [id number]  Date Initiated:  03/29/2012  Documentation initiated by:  Lorenda Ishihara  Subjective/Objective Assessment:   67 yo male admitted with malaise, found PE/DVT. PTA lived at hom with spouse and dtr.     Action/Plan:   Home when stable, active with [facility] for RN and PT   Anticipated DC Date:  04/01/2012   Anticipated DC Plan:  HOME W HOME HEALTH SERVICES      DC Planning Services  CM consult      Choice offered to / List presented to:          [facility] arranged  HH-1 RN  HH-2 PT      [facility] agency  [facility]   Status of service:  In process, will continue to follow Medicare Important Message given?   (If response is "NO", the following Medicare IM given date fields will be blank) Date Medicare IM given:   Date Additional Medicare IM given:    Discharge Disposition:    Per UR Regulation:  Reviewed for med. necessity/level of care/duration of stay  If discussed at Long Length of Stay Meetings, dates discussed:    Comments:

## 2012-03-29 NOTE — Progress Notes (Signed)
Physical Therapy Treatment Patient Details Name: Chad Olson MRN: [id number] DOB: January 04, 1945 Today's Date: 03/29/2012 Time: 8657-8469 PT Time Calculation (min): 24 min  PT Assessment / Plan / Recommendation Comments on Treatment Session  Pt ambulated in hallway and performed exercises in recliner.  Pt reports feeling better and only pain with performing mobility mostly getting OOB, but states it settles down with rest.    Follow Up Recommendations  Home health PT     Does the patient have the potential to tolerate intense rehabilitation     Barriers to Discharge        Equipment Recommendations  None recommended by PT    Recommendations for Other Services    Frequency     Plan Discharge plan remains appropriate;Frequency remains appropriate    Precautions / Restrictions Precautions Precautions: Fall Precaution Comments: 2 drains   Pertinent Vitals/Pain Reports pain mostly L flank with bed mobility, declined meds, states gets better with rest    Mobility  Bed Mobility Bed Mobility: Supine to Sit Supine to Sit: 4: Min assist Details for Bed Mobility Assistance: pt givens hands to assist in pulling trunk upright Transfers Transfers: Sit to Stand;Stand to Sit Sit to Stand: With upper extremity assist;From bed;4: Min guard;From elevated surface Stand to Sit: 4: Min guard;With upper extremity assist;To chair/3-in-1 Ambulation/Gait Ambulation/Gait Assistance: 4: Min guard Ambulation Distance (Feet): 200 Feet Assistive device: Rolling walker Ambulation/Gait Assistance Details: verbal cues for safe RW distance and posture, did better today maintaining good posture Gait Pattern: Step-through pattern;Decreased stride length;Trunk flexed General Gait Details:  pt denies SOB or dizziness, only c/o weak LEs    Exercises General Exercises - Lower Extremity Ankle Circles/Pumps: AROM;Both;20 reps;Seated Long Arc Quad: AROM;Both;15 reps;Seated Hip ABduction/ADduction:  AROM;Both;20 reps Hip Flexion/Marching: AROM;Both;20 reps;Seated   PT Diagnosis:    PT Problem List:   PT Treatment Interventions:     PT Goals Acute Rehab PT Goals PT Goal: Supine/Side to Sit - Progress: Progressing toward goal PT Goal: Stand to Sit - Progress: Progressing toward goal PT Goal: Ambulate - Progress: Progressing toward goal PT Goal: Perform Home Exercise Program - Progress: Progressing toward goal  Visit Information  Last PT Received On: 03/29/12 Assistance Needed: +1    Subjective Data  Subjective: My legs are just weak.   Cognition  Cognition Overall Cognitive Status: Appears within functional limits for tasks assessed/performed Arousal/Alertness: Awake/alert Orientation Level: Appears intact for tasks assessed Behavior During Session: [facility] for tasks performed    Balance     End of Session PT - End of Session Activity Tolerance: Patient tolerated treatment well Patient left: in chair;with call bell/phone within reach;with family/visitor present   GP     Deanette Tullius,KATHrine E 03/29/2012, 2:43 PM Zenovia Jarred, PT, DPT 03/29/2012 Pager: [phone]

## 2012-03-29 NOTE — Progress Notes (Signed)
Subjective: Pt feels ok. A little sore on left side  Objective: Physical Exam: BP 129/79  Pulse 79  Temp(Src) 98.4 F (36.9 C) (Oral)  Resp 20  Ht 5\' 11"  (1.803 m)  Wt 275 lb (124.739 kg)  BMI 38.37 kg/m2  SpO2 96% Rt PCN capped, no drainage or tenderness at site (L)PCN capped, no drainage, mildly tender Good output from conduit, blood tinged UOP   Labs: CBC  Recent Labs  03/27/12 0456 03/29/12 0525  WBC 9.9 8.0  HGB 12.9* 13.4  HCT 39.2 41.3  PLT 231 303   BMET  Recent Labs  03/27/12 0456  NA 137  K 3.6  CL 102  CO2 26  GLUCOSE 140*  BUN 7  CREATININE 0.79  CALCIUM 8.5   LFT No results found for this basename: PROT, ALBUMIN, AST, ALT, ALKPHOS, BILITOT, BILIDIR, IBILI, LIPASE,  in the last 72 hours PT/INR No results found for this basename: LABPROT, INR,  in the last 72 hours   Studies/Results: No results found.  Assessment/Plan: S/p (B) PCN with subsequent (L)JJ stenting and capping of both PCNs. Plan per Urology Agree with ok to begin Lovenox    LOS: 4 days    Brayton El PA-C 03/29/2012 8:35 AM

## 2012-03-29 NOTE — Discharge Summary (Signed)
Physician Discharge Summary  Patient ID: Chad Olson MRN: [id number] DOB/AGE: [birth year] 67 y.o.  Admit date: 03/25/2012 Discharge date: 03/29/2012  Admission Diagnoses: Urinoma, Pulmonary Emboli with Deep Vein Thrombosis, history of Bladder cancer  Discharge Diagnoses: Urinoma, Pulmonary Emboli with Deep Vein Thrombosis, history of Bladder cancer Principal Problem:   Nausea alone Active Problems:   Bladder cancer   Bilateral renal cysts   Leukocytosis, unspecified   HTN (hypertension)   Acute pulmonary embolism   Discharged Condition: fair  Hospital Course:   1 - Nausea, Malaise, Urinoma After Cystectomy for Bladder Cancer - s/p robotic cystoprostatectomy with intracorporeal ileal conduit urinary diversion 02/22/2012 for 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 Directly admitted and underwent urgent bilateral nephrostomy by IR 3/24. Had bilateral nephrostograms 3/27 which revealed Rt ureter intact without obsruction, but left leak in distal ureter, left JJ stent placed antegrade and both neph tubes clamped.  2 - Bilateral Pulmonary Emboli - Incidental on CT imaging for post-op malaise. Placed on heparin gtt per hospitalist service, at admission and bridged to Lovenox injections which the pt learned how to give. Also with RLE DVT by duplex. He will have f/u with PCP and new appt with heme-onc for ongoing management.   3 - Bacteruria / UTI - Pt with low-grade fevers and bacteruria at presentation. Final UCX with MRSA. Placed on IV vancomycin with plan for PO Macrobid at DC as clinical infectious parmaters now resolved.   Consults: Hospitalist, Interventional Radiology  Significant Diagnostic Studies: Nephrostograms 03/28/2012.  Treatments: IV hydration, antibiotics:  vancomycin and Cipro, anticoagulation: heparin and LMW heparin.  Discharge Exam: Blood pressure 120/75, pulse 72, temperature 98.3 F (36.8 C), temperature source Oral, resp. rate 16, height 5\' 11"  (1.803 m), weight 124.739 kg (275 lb), SpO2 98.00%. General appearance: alert, cooperative and appears stated age Head: Normocephalic, without obvious abnormality, atraumatic Eyes: conjunctivae/corneas clear. PERRL, EOM's intact. Fundi benign. Ears: normal TM's and external ear canals both ears Nose: Nares normal. Septum midline. Mucosa normal. No drainage or sinus tenderness. Throat: lips, mucosa, and tongue normal; teeth and gums normal Neck: no adenopathy, no carotid bruit, no JVD, supple, symmetrical, trachea midline and thyroid not enlarged, symmetric, no tenderness/mass/nodules Back: symmetric, no curvature. ROM normal. No CVA tenderness. Resp: clear to auscultation bilaterally Chest wall: no tenderness Cardio: regular rate and rhythm, S1, S2 normal, no murmur, click, rub or gallop GI: soft, non-tender; bowel sounds normal; no masses,  no organomegaly Male genitalia: normal Extremities: extremities normal, atraumatic, no cyanosis or edema Pulses: 2+ and symmetric Skin: Skin color, texture, turgor normal. No rashes or lesions Lymph nodes: Cervical, supraclavicular, and axillary nodes normal. Neurologic: Grossly normal Incision/Wound: Bilateral neph tubes capped. Urostomy pink  /patent with rose-colored urine.  Disposition: 06-Home-Health Care Svc   Future Appointments Provider Department Dept Phone   04/18/2012 9:00 AM Delcie Roch [facility] [phone]   04/18/2012 9:30 AM Benjiman Core, MD [place] [facility] [phone]       Medication List    TAKE these medications       enoxaparin 120 MG/0.8ML injection  Commonly known as:  LOVENOX  Inject 0.8 mLs (120 mg total) into the skin every 12 (twelve) hours.      HYDROcodone-acetaminophen 5-325 MG per tablet  Commonly known  as:  NORCO/VICODIN  Take 1-2 tablets by mouth every 4 (four) hours as needed.     lisinopril 30 MG tablet  Commonly known as:  PRINIVIL,ZESTRIL  Take 30 mg by mouth every morning.     nitrofurantoin (macrocrystal-monohydrate) 100 MG capsule  Commonly known as:  MACROBID  Take 1 capsule (100 mg total) by mouth daily. TO prevent infection while tubes / stents in place     pantoprazole 40 MG tablet  Commonly known as:  PROTONIX  Take 40 mg by mouth daily.     pravastatin 20 MG tablet  Commonly known as:  PRAVACHOL  Take 20 mg by mouth every morning.           Follow-up Information   Follow up with [name], MD. Schedule an appointment as soon as possible for a visit in 1 week.   Contact information:   [address] [place] [address] [phone]       Follow up with [facility], MD. Schedule an appointment as soon as possible for a visit in 1 week. (for longetrm PE treatment)    Contact information:   [address] [place] [address] [phone]       Follow up with Sebastian Ache, MD. (Dr. Emmaline Life office will contact you to make appt in about 2 weeks.)    Contact information:   [address] [place] [address] [phone]       Signed: Sebastian Ache 03/29/2012, 5:23 PM

## 2012-03-29 NOTE — Progress Notes (Signed)
ANTICOAGULATION CONSULT NOTE - Follow Up Consult  Pharmacy Consult for Lovenox Indication: pulmonary embolus  No Known Allergies  Patient Measurements: Height: 5\' 11"  (180.3 cm) Weight: 275 lb (124.739 kg) IBW/kg (Calculated) : 75.3 Heparin Dosing Weight: 103 kg  Vital Signs: Temp: 98.4 F (36.9 C) (03/28 0523) Temp src: Oral (03/28 0523) BP: 129/79 mmHg (03/28 0523) Pulse Rate: 79 (03/28 0523)  Labs:  Recent Labs  03/27/12 0456 03/28/12 0425 03/29/12 0525  HGB 12.9*  --  13.4  HCT 39.2  --  41.3  PLT 231  --  303  HEPARINUNFRC 0.30 0.31 0.30  CREATININE 0.79  --   --     Estimated Creatinine Clearance: 122.2 ml/min (by C-G formula based on Cr of 0.79).   Assessment: 41 yom with bladder cancer s/p cystectomy and urinary diversion 02/22/12. Bilateral ureteral stents were removed 03/21/12. Patient seen in office 3/24 with c/o nausea, malaise. CT at office c/w RLQ urinoma and bilateral PE.  IV heparin started early 3/25.   S/p bilateral ureteral stent placement 3/27; IV heparin resumed s/p procedure and current heparin level is therapeutic.    Pharmacy asked to dose Lovenox for long-term anticoagulation.  CBC is WNL, Plt 303, Hgb 13.4.  No bleeding or complications per RN.  Goal of Therapy:  Heparin level 0.3-0.7 units/ml Monitor platelets by anticoagulation protocol: Yes   Plan:  D/C heparin IV infusion at 0900  Lovenox 120mg  SQ q12h, first dose to be given at 1000  Follow up renal function and CBC    Lynann Beaver PharmD, BCPS Pager [phone] 03/29/2012 8:38 AM

## 2012-03-29 NOTE — Progress Notes (Signed)
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 Directly admitted and underwent urgent bilateral nephrostomy by IR 3/24. Underwent bilateral nephrostogram 3/26 which showed Rt ureter patent, Lt with extrav distal prior to anastamosis, left JJ stent placed across.  2 - Bilateral Pulmonary Emboli - Incidental on CT imaging for post-op malaise. Now on heparin gtt per hospitalist service, appreciate. No s/s excess bleeding. No CP/SOB.   3 - Bacteruria / UTI - Pt with low-grade fevers and bacteruria at presentation.UCX with MRSA sens vanc and nitrofuratoin. Now afebrile and on Vanc.  Today Chad Olson is without complaints. He has some mild left abd discomfort, worse with ambulation.      Objective: Vital signs in last 24 hours: Temp:  [98.4 F (36.9 C)-98.9 F (37.2 C)] 98.4 F (36.9 C) (03/28 0523) Pulse Rate:  [65-91] 79 (03/28 0523) Resp:  [11-20] 20 (03/28 0523) BP: (115-137)/(68-92) 129/79 mmHg (03/28 0523) SpO2:  [94 %-98 %] 96 % (03/28 0523) Last BM Date: 03/27/12  Intake/Output from previous day: 03/27 0701 - 03/28 0700 In: 955.4 [P.O.:120; I.V.:835.4] Out: 2200 [Urine:2200] Intake/Output this shift:    General appearance: alert, cooperative, appears stated age and wife at bedside Head: Normocephalic, without obvious abnormality, atraumatic Eyes: conjunctivae/corneas clear. PERRL, EOM's intact. Fundi benign. Ears: normal TM's and external ear canals both ears Nose: Nares normal. Septum midline. Mucosa normal. No drainage or sinus tenderness. Throat: lips, mucosa, and tongue normal; teeth and gums  normal Neck: no adenopathy, no carotid bruit, no JVD, supple, symmetrical, trachea midline and thyroid not enlarged, symmetric, no tenderness/mass/nodules Back: symmetric, no curvature. ROM normal. No CVA tenderness., bilat neph  tubes capped. Resp: clear to auscultation bilaterally Chest wall: no tenderness Cardio: regular rate and rhythm, S1, S2 normal, no murmur, click, rub or gallop GI: soft, non-tender; bowel sounds normal; no masses,  no organomegaly Male genitalia: normal Extremities: extremities normal, atraumatic, no cyanosis or edema Pulses: 2+ and symmetric Skin: Skin color, texture, turgor normal. No rashes or lesions Lymph nodes: Cervical, supraclavicular, and axillary nodes normal. Neurologic: Grossly normal Incision/Wound: Conduit pink / patent with rose colored urine.  Lab Results:   Recent Labs  03/27/12 0456  WBC 9.9  HGB 12.9*  HCT 39.2  PLT 231   BMET  Recent Labs  03/27/12 0456  NA 137  K 3.6  CL 102  CO2 26  GLUCOSE 140*  BUN 7  CREATININE 0.79  CALCIUM 8.5   PT/INR No results found for this basename: LABPROT, INR,  in the last 72 hours ABG No results found for this basename: PHART, PCO2, PO2, HCO3,  in the last 72 hours  Studies/Results: No results found.  Anti-infectives: Anti-infectives   Start     Dose/Rate Route Frequency Ordered Stop   03/29/12 1000  vancomycin (VANCOCIN) 1,250 mg in sodium chloride 0.9 % 250 mL IVPB     1,250 mg 166.7 mL/hr over 90 Minutes Intravenous Every 12 hours 03/28/12 2124     03/28/12 2200  vancomycin (VANCOCIN) 2,000 mg in sodium chloride 0.9 % 500 mL IVPB     2,000 mg 250 mL/hr over 120 Minutes Intravenous  Once 03/28/12 2123 03/29/12 0011   03/28/12  2440  ciprofloxacin (CIPRO) IVPB 400 mg  Status:  Discontinued     400 mg 200 mL/hr over 60 Minutes Intravenous 60 min pre-op 03/28/12 0940 03/28/12 2101   03/25/12 2030  ciprofloxacin (CIPRO) IVPB 400 mg     400 mg 200 mL/hr over 60 Minutes Intravenous  On call 03/25/12 1936 03/25/12 2050   03/25/12 1700  cefTRIAXone (ROCEPHIN) 1 g in dextrose 5 % 50 mL IVPB  Status:  Discontinued     1 g 100 mL/hr over 30 Minutes Intravenous Every 24 hours 03/25/12 1636 03/28/12 2101      Assessment/Plan:  1 - Nausea, Malaise, Urinoma After Cystectomy for Bladder Cancer - continue neph tuebs clamped. Plan will be to leave neph tubes and JJ stent in place x 3-4 weeks and repeat left anerograde nephrostogram. Will remove JJ stent when no extrav.  2 - Bilateral Pulmonary Emboli - PT now safe to transition to lovenox or other as per hospitalist recs, appreciate.   3 - Bacteruria / UTI - No on Vanc, will plan for DC on proph nitrofurantoin  4 - Disposition - DC Home after established on lovenox or other, hopefully within next day or so.  [facility], Jimi Giza 03/29/2012

## 2012-03-29 NOTE — Progress Notes (Signed)
Pt discharged to home on SQ lovenox. Pt's pharmacy, [facility], notified of lovenox prescription.  [facility] did not have lovenox available. [facility] [address]  and verified that [facility].  On-call RN case Production designer, theatre/television/film for [employer], Colleen Can, notified of above events and that pt has an order to resume HHRN & HHPT.  Pt is active with [facility] and Colleen Can will fax and process these orders for Home Health.  RN completed lovenox education and both pt and family demonstrated understanding of administering SQ lovenox.  Pt received evening dose of Lovenox prior to discharge after verifying with pharmacy. Maeola Harman

## 2012-03-29 NOTE — Progress Notes (Signed)
ANTICOAGULATION CONSULT NOTE - Follow Up Consult  Pharmacy Consult for IV heparin Indication: pulmonary embolus  No Known Allergies  Patient Measurements: Height: 5\' 11"  (180.3 cm) Weight: 275 lb (124.739 kg) IBW/kg (Calculated) : 75.3 Heparin Dosing Weight: 103 kg  Vital Signs: Temp: 98.4 F (36.9 C) (03/28 0523) Temp src: Oral (03/28 0523) BP: 129/79 mmHg (03/28 0523) Pulse Rate: 79 (03/28 0523)  Labs:  Recent Labs  03/27/12 0456 03/28/12 0425 03/29/12 0525  HGB 12.9*  --   --   HCT 39.2  --   --   PLT 231  --   --   HEPARINUNFRC 0.30 0.31 0.30  CREATININE 0.79  --   --     Estimated Creatinine Clearance: 122.2 ml/min (by C-G formula based on Cr of 0.79).   Assessment: 5 yom with bladder cancer s/p cystectomy and urinary diversion 02/22/12. Bilateral ureteral stents were removed 03/21/12. Patient seen in office 3/24 with c/o nausea, malaise. CT at office c/w RLQ urinoma and bilateral PE.  IV heparin started early 3/25.   IV heparin currently running at 1950 units/hr and AM heparin level therapeutic.    S/p bilateral ureteral stent placement 3/27.  IR resumed IV heparin 3/27 PM after procedure.  CBC results are pending.  No bleeding or complications per RN.  Goal of Therapy:  Heparin level 0.3-0.7 units/ml Monitor platelets by anticoagulation protocol: Yes   Plan:  Continue heparin IV infusion at 1950 units/hr Daily heparin level and CBC Continue to monitor H&H and platelets   Follow up plan to transition to Lovenox given h/o malignancy   Lynann Beaver PharmD, BCPS Pager [phone] 03/29/2012 7:38 AM

## 2012-04-11 ENCOUNTER — Other Ambulatory Visit (HOSPITAL_COMMUNITY): Payer: Self-pay | Admitting: Urology

## 2012-04-11 DIAGNOSIS — N3289 Other specified disorders of bladder: Secondary | ICD-10-CM

## 2012-04-11 DIAGNOSIS — C679 Malignant neoplasm of bladder, unspecified: Secondary | ICD-10-CM

## 2012-04-18 ENCOUNTER — Ambulatory Visit (HOSPITAL_BASED_OUTPATIENT_CLINIC_OR_DEPARTMENT_OTHER): Payer: BC Managed Care – PPO | Admitting: Oncology

## 2012-04-18 ENCOUNTER — Other Ambulatory Visit (HOSPITAL_BASED_OUTPATIENT_CLINIC_OR_DEPARTMENT_OTHER): Payer: BC Managed Care – PPO | Admitting: Lab

## 2012-04-18 VITALS — BP 126/85 | HR 95 | Temp 97.7°F | Resp 20 | Ht 71.0 in | Wt 235.3 lb

## 2012-04-18 DIAGNOSIS — C679 Malignant neoplasm of bladder, unspecified: Secondary | ICD-10-CM

## 2012-04-18 LAB — COMPREHENSIVE METABOLIC PANEL (CC13)
AST: 16 U/L (ref 5–34)
Alkaline Phosphatase: 82 U/L (ref 40–150)
BUN: 9.1 mg/dL (ref 7.0–26.0)
Creatinine: 1.1 mg/dL (ref 0.7–1.3)
Glucose: 121 mg/dl — ABNORMAL HIGH (ref 70–99)
Total Bilirubin: 0.46 mg/dL (ref 0.20–1.20)

## 2012-04-18 LAB — CBC WITH DIFFERENTIAL/PLATELET
Basophils Absolute: 0.1 10*3/uL (ref 0.0–0.1)
EOS%: 3.4 % (ref 0.0–7.0)
Eosinophils Absolute: 0.3 10*3/uL (ref 0.0–0.5)
HCT: 43.5 % (ref 38.4–49.9)
HGB: 14.6 g/dL (ref 13.0–17.1)
LYMPH%: 19.5 % (ref 14.0–49.0)
MCH: 31.3 pg (ref 27.2–33.4)
MCV: 93.3 fL (ref 79.3–98.0)
MONO%: 10.1 % (ref 0.0–14.0)
NEUT%: 66.3 % (ref 39.0–75.0)
Platelets: 245 10*3/uL (ref 140–400)
RDW: 13.8 % (ref 11.0–14.6)

## 2012-04-18 NOTE — Progress Notes (Signed)
Hematology and Oncology Follow Up Visit  Chad Olson [id number] 12/22/1945 67 y.o. 04/18/2012 9:26 AM Chad Olson, MDSpear, Tammy, MD   Principle Diagnosis: Superficial bladder cancer initially diagnosed in 2011 with persistent disease.  Prior Therapy:  1. The patient had a cystoscopic TURBT in January 2012. 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 No evidence of any invasion. The patient was treated with induction BCG, completed in May 2012 and his maintenance was completed in July 2012.  2. Intravesicular Mitomycin-C given weekly. He is S/P 6 cycles completed 10/04/2011. 3. He is S/P Robotic-assisted laparoscopic cystoprostatectomy, bilateral extended pelvic lymphadenectomy and intracorporeal ileal conduit urinary diversion. Done in 02/2012.   Current therapy: Observation and follow up.   Interim History:  Chad Olson is a 67 year old gentleman seen today for follow-up. He completed  6 weeks of intra vesicular  Mitomycin-C back in 10/013 and also under went cystectomy in 02/2012. That was complicated by PE and urinoma, likely from leak of left distal ureter / anastomosis. He was treated with heparin at first but switched to Lovenox as out patient. He has been doing well since his discharge. Denies hematuria and dysuria. He  Denies chest pain, shortness of breath, dyspnea. No abdominal pain. He has had mild nausea, but no vomiting. He did have a DVT in the right leg but no swelling noted.   Medications: I have reviewed the patient's current medications. Current outpatient prescriptions:enoxaparin (LOVENOX) 120 MG/0.8ML injection, Inject 0.8 mLs (120 mg total) into the skin every 12 (twelve) hours., Disp: 60 Syringe, Rfl: 2;  enoxaparin (LOVENOX) KIT, 1 kit by Does not apply route once., Disp: 1 kit, Rfl: ;  HYDROcodone-acetaminophen (NORCO/VICODIN) 5-325 MG per tablet, Take 1-2 tablets by mouth every 4 (four) hours as needed., Disp: 30 tablet, Rfl: 1 lisinopril  (PRINIVIL,ZESTRIL) 30 MG tablet, Take 30 mg by mouth every morning. , Disp: , Rfl: ;  mirtazapine (REMERON) 15 MG tablet, Take 15 mg by mouth daily., Disp: , Rfl: ;  nitrofurantoin, macrocrystal-monohydrate, (MACROBID) 100 MG capsule, Take 1 capsule (100 mg total) by mouth daily. TO prevent infection while tubes / stents in place, Disp: 30 capsule, Rfl: 1;  pantoprazole (PROTONIX) 40 MG tablet, Take 40 mg by mouth daily., Disp: , Rfl:  pravastatin (PRAVACHOL) 20 MG tablet, Take 20 mg by mouth every morning. , Disp: , Rfl:   Allergies: No Known Allergies  Past Medical History, Surgical history, Social history, and Family History were reviewed and updated.  Review of Systems: Constitutional:  Negative for fever, chills, night sweats, anorexia, weight loss, pain. Cardiovascular: no chest pain or dyspnea on exertion Respiratory: no cough, shortness of breath, or wheezing Neurological: no TIA or stroke symptoms Dermatological: negative ENT: negative Skin: Negative. Gastrointestinal: no abdominal pain, change in bowel habits, or black or bloody stools Genito-Urinary: no dysuria, trouble voiding, or hematuria Hematological and Lymphatic: negative Breast: negative for breast lumps Musculoskeletal: negative Remaining ROS negative.  Physical Exam: Blood pressure 126/85, pulse 95, temperature 97.7 F (36.5 C), temperature source Oral, resp. rate 20, height 5\' 11"  (1.803 m), weight 235 lb 4.8 oz (106.731 kg). ECOG: 1 General appearance: alert, cooperative and no distress Head: Normocephalic, without obvious abnormality, atraumatic Neck: no adenopathy, no carotid bruit, no JVD, supple, symmetrical, trachea midline and thyroid not enlarged, symmetric, no tenderness/mass/nodules Lymph nodes: Cervical, supraclavicular, and axillary nodes normal. Heart:regular rate and rhythm, S1, S2 normal, no murmur, click, rub or gallop Lung:chest clear, no wheezing, rales, normal symmetric air entry,  no tachypnea,  retractions or cyanosis Abdomen: soft, non-tender, without masses or organomegaly EXT:no erythema, induration, or nodules   Lab Results: Lab Results  Component Value Date   WBC 7.9 04/18/2012   HGB 14.6 04/18/2012   HCT 43.5 04/18/2012   MCV 93.3 04/18/2012   PLT 245 04/18/2012     Chemistry      Component Value Date/Time   NA 139 04/18/2012 0801   NA 137 03/27/2012 0456   K 3.9 04/18/2012 0801   K 3.6 03/27/2012 0456   CL 105 04/18/2012 0801   CL 102 03/27/2012 0456   CO2 22 04/18/2012 0801   CO2 26 03/27/2012 0456   BUN 9.1 04/18/2012 0801   BUN 7 03/27/2012 0456   CREATININE 1.1 04/18/2012 0801   CREATININE 0.79 03/27/2012 0456      Component Value Date/Time   CALCIUM 9.3 04/18/2012 0801   CALCIUM 8.5 03/27/2012 0456   ALKPHOS 82 04/18/2012 0801   ALKPHOS 61 02/21/2012 1016   AST 16 04/18/2012 0801   AST 16 02/21/2012 1016   ALT 22 04/18/2012 0801   ALT 19 02/21/2012 1016   BILITOT 0.46 04/18/2012 0801   BILITOT 0.4 02/21/2012 1016     Impression and Plan: This is a 67 year old gentleman with the following issues:  1. Superficial bladder tumor. S/P 6 weeks of intravesicular Mitomycin-C followed by a cystectomy on 20/14. 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  2. VTE (right DVT and PE). This is due to his recent operation. I prefer he continues on Lovenox for a total of three months (till mid 06/2012). I discussed alternative options such as Warfarin or Xarelto but he elected to continue with Lovenox. I would recommend  repeating CT scan (Chest, abd and pelvis) in 06/2012 for cancer staging and to make sure clots have resolved.   3. Follow-up. AS needed.  Marland Kitchen    Chad Olson 4/17/20149:26 AM

## 2012-04-23 ENCOUNTER — Telehealth: Payer: Self-pay | Admitting: Dietician

## 2012-05-06 ENCOUNTER — Ambulatory Visit (HOSPITAL_COMMUNITY)
Admission: RE | Admit: 2012-05-06 | Discharge: 2012-05-06 | Disposition: A | Discharge status: Home / Self Care | Payer: BC Managed Care – PPO | Source: Ambulatory Visit | Attending: Urology | Admitting: Urology

## 2012-05-06 DIAGNOSIS — C679 Malignant neoplasm of bladder, unspecified: Secondary | ICD-10-CM | POA: Insufficient documentation

## 2012-05-06 DIAGNOSIS — N3289 Other specified disorders of bladder: Secondary | ICD-10-CM

## 2012-05-06 MED ORDER — IOHEXOL 300 MG/ML  SOLN
50.0000 mL | Freq: Once | INTRAMUSCULAR | Status: AC | PRN
  Administered 2012-05-06: 50 mL via INTRAVENOUS

## 2012-05-09 ENCOUNTER — Other Ambulatory Visit (HOSPITAL_COMMUNITY): Payer: Self-pay | Admitting: Internal Medicine

## 2012-05-09 DIAGNOSIS — R0600 Dyspnea, unspecified: Secondary | ICD-10-CM

## 2012-05-09 DIAGNOSIS — I2782 Chronic pulmonary embolism: Secondary | ICD-10-CM

## 2012-05-13 ENCOUNTER — Emergency Department (HOSPITAL_COMMUNITY): Payer: BC Managed Care – PPO

## 2012-05-13 ENCOUNTER — Encounter (HOSPITAL_COMMUNITY): Payer: Self-pay | Admitting: Emergency Medicine

## 2012-05-13 ENCOUNTER — Inpatient Hospital Stay (HOSPITAL_COMMUNITY)
Admission: EM | Admit: 2012-05-13 | Discharge: 2012-05-17 | DRG: 569 | Disposition: A | Discharge status: Home Health | Payer: BC Managed Care – PPO | Attending: Internal Medicine | Admitting: Internal Medicine

## 2012-05-13 DIAGNOSIS — D72829 Elevated white blood cell count, unspecified: Secondary | ICD-10-CM

## 2012-05-13 DIAGNOSIS — I2699 Other pulmonary embolism without acute cor pulmonale: Secondary | ICD-10-CM | POA: Diagnosis present

## 2012-05-13 DIAGNOSIS — I8290 Acute embolism and thrombosis of unspecified vein: Secondary | ICD-10-CM | POA: Diagnosis present

## 2012-05-13 DIAGNOSIS — A498 Other bacterial infections of unspecified site: Secondary | ICD-10-CM | POA: Diagnosis present

## 2012-05-13 DIAGNOSIS — R1319 Other dysphagia: Secondary | ICD-10-CM

## 2012-05-13 DIAGNOSIS — R509 Fever, unspecified: Secondary | ICD-10-CM

## 2012-05-13 DIAGNOSIS — I2782 Chronic pulmonary embolism: Secondary | ICD-10-CM | POA: Diagnosis present

## 2012-05-13 DIAGNOSIS — Z906 Acquired absence of other parts of urinary tract: Secondary | ICD-10-CM

## 2012-05-13 DIAGNOSIS — R112 Nausea with vomiting, unspecified: Secondary | ICD-10-CM | POA: Diagnosis present

## 2012-05-13 DIAGNOSIS — I82409 Acute embolism and thrombosis of unspecified deep veins of unspecified lower extremity: Secondary | ICD-10-CM | POA: Diagnosis present

## 2012-05-13 DIAGNOSIS — H9190 Unspecified hearing loss, unspecified ear: Secondary | ICD-10-CM | POA: Diagnosis present

## 2012-05-13 DIAGNOSIS — Z9889 Other specified postprocedural states: Secondary | ICD-10-CM

## 2012-05-13 DIAGNOSIS — E119 Type 2 diabetes mellitus without complications: Secondary | ICD-10-CM | POA: Diagnosis present

## 2012-05-13 DIAGNOSIS — N281 Cyst of kidney, acquired: Secondary | ICD-10-CM

## 2012-05-13 DIAGNOSIS — C679 Malignant neoplasm of bladder, unspecified: Secondary | ICD-10-CM | POA: Diagnosis present

## 2012-05-13 DIAGNOSIS — E876 Hypokalemia: Secondary | ICD-10-CM | POA: Diagnosis present

## 2012-05-13 DIAGNOSIS — N1330 Unspecified hydronephrosis: Secondary | ICD-10-CM | POA: Diagnosis present

## 2012-05-13 DIAGNOSIS — Z936 Other artificial openings of urinary tract status: Secondary | ICD-10-CM

## 2012-05-13 DIAGNOSIS — N12 Tubulo-interstitial nephritis, not specified as acute or chronic: Principal | ICD-10-CM | POA: Diagnosis present

## 2012-05-13 DIAGNOSIS — K219 Gastro-esophageal reflux disease without esophagitis: Secondary | ICD-10-CM

## 2012-05-13 DIAGNOSIS — R7881 Bacteremia: Secondary | ICD-10-CM | POA: Diagnosis present

## 2012-05-13 DIAGNOSIS — Z7901 Long term (current) use of anticoagulants: Secondary | ICD-10-CM

## 2012-05-13 DIAGNOSIS — A4902 Methicillin resistant Staphylococcus aureus infection, unspecified site: Secondary | ICD-10-CM | POA: Diagnosis present

## 2012-05-13 DIAGNOSIS — R4182 Altered mental status, unspecified: Secondary | ICD-10-CM | POA: Diagnosis present

## 2012-05-13 DIAGNOSIS — R933 Abnormal findings on diagnostic imaging of other parts of digestive tract: Secondary | ICD-10-CM

## 2012-05-13 DIAGNOSIS — N390 Urinary tract infection, site not specified: Secondary | ICD-10-CM | POA: Diagnosis present

## 2012-05-13 DIAGNOSIS — I10 Essential (primary) hypertension: Secondary | ICD-10-CM

## 2012-05-13 DIAGNOSIS — N3289 Other specified disorders of bladder: Secondary | ICD-10-CM | POA: Diagnosis present

## 2012-05-13 DIAGNOSIS — R110 Nausea: Secondary | ICD-10-CM

## 2012-05-13 DIAGNOSIS — B958 Unspecified staphylococcus as the cause of diseases classified elsewhere: Secondary | ICD-10-CM | POA: Diagnosis present

## 2012-05-13 LAB — BASIC METABOLIC PANEL WITH GFR
Calcium: 8.8 mg/dL (ref 8.4–10.5)
GFR calc non Af Amer: 72 mL/min — ABNORMAL LOW (ref >90)
Glucose, Bld: 143 mg/dL — ABNORMAL HIGH (ref 70–99)
Sodium: 134 meq/L — ABNORMAL LOW (ref 135–145)

## 2012-05-13 LAB — POCT I-STAT, CHEM 8
BUN: 12 mg/dL (ref 6–23)
Calcium, Ion: 1.07 mmol/L — ABNORMAL LOW (ref 1.13–1.30)
Chloride: 99 mEq/L (ref 96–112)
Creatinine, Ser: 1 mg/dL (ref 0.50–1.35)
Glucose, Bld: 148 mg/dL — ABNORMAL HIGH (ref 70–99)
HCT: 42 % (ref 39.0–52.0)
Hemoglobin: 14.3 g/dL (ref 13.0–17.0)
Potassium: 3.1 mEq/L — ABNORMAL LOW (ref 3.5–5.1)
Sodium: 136 meq/L (ref 135–145)
TCO2: 28 mmol/L (ref 0–100)

## 2012-05-13 LAB — BASIC METABOLIC PANEL
BUN: 12 mg/dL (ref 6–23)
CO2: 26 mEq/L (ref 19–32)
Chloride: 97 mEq/L (ref 96–112)
Creatinine, Ser: 1.05 mg/dL (ref 0.50–1.35)
GFR calc Af Amer: 83 mL/min — ABNORMAL LOW (ref >90)
Potassium: 3.1 mEq/L — ABNORMAL LOW (ref 3.5–5.1)

## 2012-05-13 LAB — URINALYSIS, ROUTINE W REFLEX MICROSCOPIC
Bilirubin Urine: NEGATIVE
Glucose, UA: NEGATIVE mg/dL
Ketones, ur: NEGATIVE mg/dL
Nitrite: POSITIVE — AB
Protein, ur: 30 mg/dL — AB
Specific Gravity, Urine: 1.013 (ref 1.005–1.030)
Urobilinogen, UA: 1 mg/dL (ref 0.0–1.0)
pH: 6.5 (ref 5.0–8.0)

## 2012-05-13 LAB — CBC
HCT: 40.3 % (ref 39.0–52.0)
Hemoglobin: 13.4 g/dL (ref 13.0–17.0)
MCH: 29.8 pg (ref 26.0–34.0)
MCHC: 33.3 g/dL (ref 30.0–36.0)
MCV: 89.6 fL (ref 78.0–100.0)
Platelets: 363 K/uL (ref 150–400)
RBC: 4.5 MIL/uL (ref 4.22–5.81)
RDW: 13.3 % (ref 11.5–15.5)
WBC: 7.4 10*3/uL (ref 4.0–10.5)

## 2012-05-13 LAB — CG4 I-STAT (LACTIC ACID): Lactic Acid, Venous: 1.08 mmol/L (ref 0.5–2.2)

## 2012-05-13 LAB — URINE MICROSCOPIC-ADD ON

## 2012-05-13 LAB — POCT I-STAT TROPONIN I: Troponin i, poc: 0 ng/mL (ref 0.00–0.08)

## 2012-05-13 MED ORDER — MIRTAZAPINE 15 MG PO TABS
15.0000 mg | ORAL_TABLET | Freq: Every day | ORAL | Status: DC
  Administered 2012-05-14 – 2012-05-17 (×4): 15 mg via ORAL
  Filled 2012-05-13 (×4): qty 1

## 2012-05-13 MED ORDER — ACETAMINOPHEN 325 MG PO TABS
650.0000 mg | ORAL_TABLET | Freq: Once | ORAL | Status: AC
Start: 2012-05-13 — End: 2012-05-13
  Administered 2012-05-13: 650 mg via ORAL
  Filled 2012-05-13: qty 2

## 2012-05-13 MED ORDER — IOHEXOL 300 MG/ML  SOLN
100.0000 mL | Freq: Once | INTRAMUSCULAR | Status: AC | PRN
  Administered 2012-05-13: 100 mL via INTRAVENOUS

## 2012-05-13 MED ORDER — PANTOPRAZOLE SODIUM 40 MG PO TBEC
40.0000 mg | DELAYED_RELEASE_TABLET | Freq: Every day | ORAL | Status: DC
  Administered 2012-05-14 – 2012-05-17 (×4): 40 mg via ORAL
  Filled 2012-05-13 (×4): qty 1

## 2012-05-13 MED ORDER — SODIUM CHLORIDE 0.9 % IV SOLN
Freq: Once | INTRAVENOUS | Status: AC
  Administered 2012-05-13: 19:00:00 via INTRAVENOUS

## 2012-05-13 MED ORDER — VANCOMYCIN HCL IN DEXTROSE 1-5 GM/200ML-% IV SOLN
1000.0000 mg | Freq: Two times a day (BID) | INTRAVENOUS | Status: DC
  Administered 2012-05-14 – 2012-05-17 (×6): 1000 mg via INTRAVENOUS
  Filled 2012-05-13 (×7): qty 200

## 2012-05-13 MED ORDER — SODIUM CHLORIDE 0.9 % IJ SOLN
3.0000 mL | Freq: Two times a day (BID) | INTRAMUSCULAR | Status: DC
  Administered 2012-05-14 – 2012-05-15 (×2): 3 mL via INTRAVENOUS

## 2012-05-13 MED ORDER — IOHEXOL 300 MG/ML  SOLN
50.0000 mL | Freq: Once | INTRAMUSCULAR | Status: AC | PRN
  Administered 2012-05-13: 50 mL via ORAL

## 2012-05-13 MED ORDER — ONDANSETRON HCL 4 MG PO TABS: 4.0000 mg | ORAL_TABLET | Freq: Four times a day (QID) | ORAL | Status: DC | PRN

## 2012-05-13 MED ORDER — SODIUM CHLORIDE 0.9 % IV BOLUS (SEPSIS)
1000.0000 mL | Freq: Once | INTRAVENOUS | Status: AC
  Administered 2012-05-13: 1000 mL via INTRAVENOUS

## 2012-05-13 MED ORDER — ALUM & MAG HYDROXIDE-SIMETH 200-200-20 MG/5ML PO SUSP: 30.0000 mL | Freq: Four times a day (QID) | ORAL | Status: DC | PRN

## 2012-05-13 MED ORDER — ONDANSETRON HCL 4 MG/2ML IJ SOLN: 4.0000 mg | Freq: Four times a day (QID) | INTRAMUSCULAR | Status: DC | PRN

## 2012-05-13 MED ORDER — VANCOMYCIN HCL 10 G IV SOLR
2000.0000 mg | Freq: Once | INTRAVENOUS | Status: AC
  Administered 2012-05-13: 2000 mg via INTRAVENOUS
  Filled 2012-05-13: qty 2000

## 2012-05-13 MED ORDER — POTASSIUM CHLORIDE IN NACL 20-0.9 MEQ/L-% IV SOLN
INTRAVENOUS | Status: AC
  Administered 2012-05-14: via INTRAVENOUS
  Filled 2012-05-13: qty 1000

## 2012-05-13 MED ORDER — DEXTROSE 5 % IV SOLN
1.0000 g | INTRAVENOUS | Status: DC
  Administered 2012-05-13: 1 g via INTRAVENOUS
  Filled 2012-05-13 (×2): qty 10

## 2012-05-13 NOTE — ED Notes (Signed)
Pt presents with multiple complaints at this time. Pt from Primary MD office today for chest pain and shortness of breath.
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
## 2012-05-13 NOTE — ED Notes (Signed)
Hospitalist MD at bedside. 

## 2012-05-13 NOTE — ED Notes (Signed)
Patient transported to CT 
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
## 2012-05-14 ENCOUNTER — Telehealth: Payer: Self-pay | Admitting: Internal Medicine

## 2012-05-14 ENCOUNTER — Inpatient Hospital Stay (HOSPITAL_COMMUNITY): Payer: BC Managed Care – PPO

## 2012-05-14 DIAGNOSIS — R7881 Bacteremia: Secondary | ICD-10-CM

## 2012-05-14 LAB — CBC
MCH: 30 pg (ref 26.0–34.0)
MCHC: 33.2 g/dL (ref 30.0–36.0)
MCV: 90.6 fL (ref 78.0–100.0)
Platelets: 354 10*3/uL (ref 150–400)
RBC: 4.26 MIL/uL (ref 4.22–5.81)
RDW: 13.7 % (ref 11.5–15.5)

## 2012-05-14 LAB — BASIC METABOLIC PANEL
BUN: 9 mg/dL (ref 6–23)
Chloride: 106 mEq/L (ref 96–112)
GFR calc Af Amer: >90 mL/min (ref >90)
Glucose, Bld: 121 mg/dL — ABNORMAL HIGH (ref 70–99)
Potassium: 3.2 mEq/L — ABNORMAL LOW (ref 3.5–5.1)

## 2012-05-14 LAB — MRSA PCR SCREENING: MRSA by PCR: POSITIVE — AB

## 2012-05-14 LAB — GLUCOSE, CAPILLARY

## 2012-05-14 LAB — APTT: aPTT: 44 seconds — ABNORMAL HIGH (ref 24–37)

## 2012-05-14 MED ORDER — INSULIN ASPART 100 UNIT/ML ~~LOC~~ SOLN
0.0000 [IU] | Freq: Three times a day (TID) | SUBCUTANEOUS | Status: DC
  Administered 2012-05-14: 2 [IU] via SUBCUTANEOUS
  Administered 2012-05-15 – 2012-05-16 (×3): 1 [IU] via SUBCUTANEOUS
  Administered 2012-05-16: 0 [IU] via SUBCUTANEOUS
  Administered 2012-05-17: 1 [IU] via SUBCUTANEOUS

## 2012-05-14 MED ORDER — ENSURE COMPLETE PO LIQD
237.0000 mL | Freq: Two times a day (BID) | ORAL | Status: DC
  Administered 2012-05-14: 237 mL via ORAL

## 2012-05-14 MED ORDER — CEFAZOLIN SODIUM-DEXTROSE 2-3 GM-% IV SOLR
2.0000 g | Freq: Three times a day (TID) | INTRAVENOUS | Status: DC
  Administered 2012-05-14 – 2012-05-16 (×6): 2 g via INTRAVENOUS
  Filled 2012-05-14 (×7): qty 50

## 2012-05-14 MED ORDER — CHLORHEXIDINE GLUCONATE CLOTH 2 % EX PADS
6.0000 | MEDICATED_PAD | Freq: Every day | CUTANEOUS | Status: DC
  Administered 2012-05-14 – 2012-05-17 (×4): 6 via TOPICAL

## 2012-05-14 MED ORDER — ENOXAPARIN SODIUM 100 MG/ML ~~LOC~~ SOLN
1.0000 mg/kg | Freq: Two times a day (BID) | SUBCUTANEOUS | Status: DC
  Administered 2012-05-14 – 2012-05-16 (×6): 100 mg via SUBCUTANEOUS
  Filled 2012-05-14 (×8): qty 1

## 2012-05-14 MED ORDER — MUPIROCIN 2 % EX OINT
1.0000 "application " | TOPICAL_OINTMENT | Freq: Two times a day (BID) | CUTANEOUS | Status: DC
  Administered 2012-05-14 – 2012-05-17 (×7): 1 via NASAL
  Filled 2012-05-14: qty 22

## 2012-05-14 MED ORDER — ADULT MULTIVITAMIN W/MINERALS CH
1.0000 | ORAL_TABLET | Freq: Every day | ORAL | Status: DC
  Administered 2012-05-14 – 2012-05-17 (×4): 1 via ORAL
  Filled 2012-05-14 (×4): qty 1

## 2012-05-14 MED ORDER — POTASSIUM CHLORIDE CRYS ER 20 MEQ PO TBCR
40.0000 meq | EXTENDED_RELEASE_TABLET | Freq: Once | ORAL | Status: AC
  Administered 2012-05-14: 40 meq via ORAL
  Filled 2012-05-14: qty 2
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
## 2012-05-14 NOTE — Telephone Encounter (Signed)
Please advise 
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
## 2012-05-14 NOTE — Progress Notes (Signed)
Utilization Review completed.  Shereta Crothers RN CM  

## 2012-05-14 NOTE — Progress Notes (Signed)
[facility] PROGRESS NOTE  Chad Olson ZOX:[id number] DOB: 12-08-1945 DOA: 05/13/2012 PCP: [name], MD  Primary Urologist: Dr. Sebastian Ache  Brief narrative 67 year old male patient with history of bladder cancer, status post cystectomy with ileal conduit urinary diversion for refractory recurrent high-grade bladder cancer, urinoma/left ureteral leak/right hydronephrosis-underwent multiple urologic procedures-has left nephrostomy, recent incidental PE on full dose Lovenox, HTN, HL, diet-controlled DM 2 was admitted on 05/13/12 with complaints of fever, nausea, vomiting and malaise. In ED, repeat CT showed worsening right-sided hydronephrosis. Hospitalist admission requested.  Assessment/Plan: 1. Recurrent UTI/pyelonephritis: Continue IV Rocephin and vancomycin pending culture results. 2. Bacteremia (1/2  Blood cultures: gram-positive cocci in clusters)-? Source- Vs Contaminant. Continue IV vancomycin pending final culture sensitivity results. ID consulted. 3. Bladder cancer/worsening right hydronephrosis: Complicated urology history with multiple procedures. Discussed with Dr. Berneice Heinrich, urology-plan for fluoro Loopgram on 5/14 to verify anatomy. Management per urology. 4. VTE: Discussed with urology and resumed full dose Lovenox. 5. Hypokalemia: By mouth replete and follow BMP 6. Nausea and vomiting: Also be secondary to acute illness-UTI, bacteremia and hydronephrosis. Monitor on current diet. 7. Altered mental status: Unclear etiology. No focal deficits. Follow CT head.? Related to infections. 8. HTN: Controlled. 9. DM 2: Check CBGs and SSI.  Code Status: Full Family Communication: Discussed with patient's spouse and sister at bedside Disposition Plan: Remains inpatient. Not medically ready for discharge.   Consultants:  Urology  Procedures:  None  Antibiotics:  IV Rocephin 5/12 >  IV vancomycin 5/12 >   HPI/Subjective: Patient indicated that he felt much  better. Denied complaints. Denied pain, nausea or vomiting.  Objective: Filed Vitals:   05/13/12 2319 05/14/12 0507 05/14/12 0900 05/14/12 1425  BP: 125/83 117/72  112/71  Pulse: 77 81  85  Temp: 98.1 F (36.7 C) 98.3 F (36.8 C)  98.6 F (37 C)  TempSrc: Oral Oral  Oral  Resp: 16 16  18   Height: 5\' 11"  (1.803 m)     Weight: 98.521 kg (217 lb 3.2 oz)  98.158 kg (216 lb 6.4 oz)   SpO2: 99% 94%  98%    Intake/Output Summary (Last 24 hours) at 05/14/12 1639 Last data filed at 05/14/12 1500  Gross per 24 hour  Intake   1680 ml  Output   6450 ml  Net  -4770 ml   Filed Weights   05/13/12 2100 05/13/12 2319 05/14/12 0900  Weight: 106.7 kg (235 lb 3.7 oz) 98.521 kg (217 lb 3.2 oz) 98.158 kg (216 lb 6.4 oz)    Exam:   General exam: Comfortable.  Respiratory system: Clear. No increased work of breathing.  Cardiovascular system: S1 & S2 heard, RRR. No JVD, murmurs, gallops, clicks or pedal edema. Telemetry: sinus rhythm mostly. Occasional sinus tachycardia.  Gastrointestinal system: Abdomen is nondistended, soft and nontender. Normal bowel sounds heard. Right-sided ileal conduit intact. Left nephrostomy intact.  Central nervous system: Alert and oriented. No focal neurological deficits.  Extremities: Symmetric 5 x 5 power.   Data Reviewed: Basic Metabolic Panel:  Recent Labs Lab 05/13/12 1650 05/13/12 1700 05/14/12 0433  NA 134* 136 142  K 3.1* 3.1* 3.2*  CL 97 99 106  CO2 26  --  28  GLUCOSE 143* 148* 121*  BUN 12 12 9   CREATININE 1.05 1.00 0.92  CALCIUM 8.8  --  8.7   Liver Function Tests: No results found for this basename: AST, ALT, ALKPHOS, BILITOT, PROT, ALBUMIN,  in the last 168 hours  Recent Labs Lab 05/13/12  1650  LIPASE 25   No results found for this basename: AMMONIA,  in the last 168 hours CBC:  Recent Labs Lab 05/13/12 1650 05/13/12 1700 05/14/12 0433  WBC 7.4  --  6.7  HGB 13.4 14.3 12.8*  HCT 40.3 42.0 38.6*  MCV 89.6  --  90.6   PLT 363  --  354   Cardiac Enzymes: No results found for this basename: CKTOTAL, CKMB, CKMBINDEX, TROPONINI,  in the last 168 hours BNP (last 3 results) No results found for this basename: PROBNP,  in the last 8760 hours CBG: No results found for this basename: GLUCAP,  in the last 168 hours  Recent Results (from the past 240 hour(s))  CULTURE, BLOOD (ROUTINE X 2)     Status: None   Collection Time    05/13/12  6:40 PM      Result Value Range Status   Specimen Description BLOOD LEFT ARM   Final   Special Requests BOTTLES DRAWN AEROBIC AND ANAEROBIC 4CC   Final   Culture  Setup Time 05/13/2012 23:52   Final   Culture     Final   Value: GRAM POSITIVE COCCI IN CLUSTERS     Note: Gram Stain Report Called to,Read Back By and Verified With: GRACE SISON 05/14/12 1525 BY SMITHERSJ   Report Status PENDING   Incomplete  CULTURE, BLOOD (ROUTINE X 2)     Status: None   Collection Time    05/13/12  6:50 PM      Result Value Range Status   Specimen Description BLOOD RIGHT ARM   Final   Special Requests BOTTLES DRAWN AEROBIC AND ANAEROBIC 2CC   Final   Culture  Setup Time 05/13/2012 23:52   Final   Culture     Final   Value:        BLOOD CULTURE RECEIVED NO GROWTH TO DATE CULTURE WILL BE HELD FOR 5 DAYS BEFORE ISSUING A FINAL NEGATIVE REPORT   Report Status PENDING   Incomplete  MRSA PCR SCREENING     Status: Abnormal   Collection Time    05/14/12 12:41 AM      Result Value Range Status   MRSA by PCR POSITIVE (*) NEGATIVE Final   Comment:            The GeneXpert MRSA Assay (FDA     approved for NASAL specimens     only), is one component of a     comprehensive MRSA colonization     surveillance program. It is not     intended to diagnose MRSA     infection nor to guide or     monitor treatment for     MRSA infections.     RESULT CALLED TO, READ BACK BY AND VERIFIED WITH:     L. HARRIS RN AT 0310 ON 05.13.14 BY SHUEA     Studies: Dg Chest 2 View  05/13/2012  *RADIOLOGY REPORT*   Clinical Data: Fever, shortness of breath.  CHEST - 2 VIEW  Comparison: 02/12/2012  Findings: Heart is normal size.  No confluent airspace opacities or effusions.  No acute bony abnormality.  IMPRESSION: No acute findings.   Original Report Authenticated By: Charlett Nose, M.D.    Ct Abdomen Pelvis W Contrast  05/13/2012  *RADIOLOGY REPORT*  Clinical Data: Chest pain, short of breath, history of bladder carcinoma  CT ABDOMEN AND PELVIS WITH CONTRAST  Technique:  Multidetector CT imaging of the abdomen and pelvis was performed following the  standard protocol during bolus administration of intravenous contrast.  Contrast: 50mL OMNIPAQUE IOHEXOL 300 MG/ML  SOLN, OMNIPAQUE IOHEXOL 300 MG/ML  SOLN  Comparison: CT abdomen pelvis of 03/25/2012  Findings: The lung bases are clear.  The liver enhances with no focal abnormality and no ductal dilatation is noted.  No calcified gallstones are seen although there is slightly higher attenuation debris layering in the gallbladder which may represent gallbladder sludge or noncalcified gallstones.  The pancreas is normal in size and the pancreatic duct is not dilated.  The adrenal glands and spleen are unremarkable.  The stomach is not well distended.  The kidneys faintly enhance and there is a left percutaneous nephrostomy as well as a left double-J stent present within the left renal pelvis.  Bilateral renal cysts are noted.  There is moderate right hydronephrosis which has increased slightly since the prior CT.  The right ureter is dilated to the insertion into the ileal conduit.  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  Stenosis of the distal right ureter at this site cannot be excluded in view of the slightly increased right hydronephrosis.  A  complex cyst is noted posteriorly in the left kidney.  The position of the distal aspect of the double-J stent on the left is uncertain, possibly either within the distal left ureter near the or near the insertion into the ileal conduit.  The patient has undergone excision of the prostate and urinary bladder.  There is a small amount of fluid within the pelvis.  This would be unlikely to be postoperative in nature in view of the time interval since the prior surgery and therefore a leak cannot be excluded.  Clinical correlation is recommended.  No abnormality of the colon is seen.  The terminal ileum and the appendix are unremarkable.  IMPRESSION:  1.  Slight increase in degree of right hydronephrosis and right hydroureter to the point of insertion into the ileal conduit. Cannot exclude mild distal right ureteral stricture. 2.  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  3.  Some fluid is noted within the pelvis, somewhat unusual for postop fluid in view of the time interval since the surgery. Therefore cannot exclude a leak.   Original Report Authenticated By: Dwyane Dee, M.D.      Additional labs:   Scheduled Meds: . cefTRIAXone (ROCEPHIN)  IV  1 g Intravenous Q24H  . Chlorhexidine Gluconate Cloth  6 each Topical Q0600  . enoxaparin (LOVENOX) injection  1 mg/kg Subcutaneous Q12H  . feeding supplement  237 mL Oral BID  . mirtazapine  15 mg Oral Daily  . multivitamin with minerals  1 tablet Oral Daily  . mupirocin ointment  1 application Nasal BID  . pantoprazole  40 mg Oral Daily  . sodium chloride  3 mL Intravenous Q12H  . vancomycin  1,000 mg Intravenous Q12H   Continuous Infusions:    Principal Problem:   UTI (lower urinary tract infection) Active Problems:   Bladder cancer   Acute pulmonary embolism (3/14)   Nephrostomy status   Bilateral hydronephrosis   DVT (deep venous thrombosis) 3/14   Fever    Time spent: 45 minutes.    [facility]  [facility] Pager [phone].   If 8PM-8AM, please contact night-coverage  at [email or web address], password [facility] 05/14/2012, 4:39 PM  LOS: 1 day

## 2012-05-14 NOTE — Telephone Encounter (Signed)
Pt in hospital, wife called to cx the echo appointment for Thursday, she wanted to know if Dr. Rennis Golden wanted him to have it done while he is in the hospital.
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
## 2012-05-14 NOTE — Care Management Note (Addendum)
    Page 1 of 2   05/17/2012     3:00:24 PM   CARE MANAGEMENT NOTE 05/17/2012  Patient:  Chad Olson, Chad Olson   Account Number:  [id number]  Date Initiated:  05/14/2012  Documentation initiated by:  Lanier Clam  Subjective/Objective Assessment:   ADMITTED W/UTI.HX:DVT,BLADDER CA.     Action/Plan:   FROM HOME W/SPOUSE.HAS PCP,PHARMACY.   Anticipated DC Date:  05/17/2012   Anticipated DC Plan:  HOME W HOME HEALTH SERVICES      DC Planning Services  CM consult      Choice offered to / List presented to:  C-1 Patient        HH arranged  HH-1 RN  IV Antibiotics      HH agency  [facility]   Status of service:  Completed, signed off Medicare Important Message given?   (If response is "NO", the following Medicare IM given date fields will be blank) Date Medicare IM given:   Date Additional Medicare IM given:    Discharge Disposition:  HOME W HOME HEALTH SERVICES  Per UR Regulation:  Reviewed for med. necessity/level of care/duration of stay  If discussed at Long Length of Stay Meetings, dates discussed:    Comments:  05/17/12 Krystol Rocco RN,BSN NCM [phone] PICC PLACED.HHRN-IV ABX,& SAFETY EVAL.[facility] KRISTEN AWARE OF D/C HOME.MD UPDATED. [facility] KRISTEN REP FOLLOWING FOR HHRN-LONG TERM IV ABX,ID NOTES ALREADY SATISFIES WHAT [facility] WILL DO NEED- STATES LABS DRAWS,VANC TROUGH,IV ABX DURATION.WILL NEED HHRN ORDER ONLY.  05/14/12 Graiden Henes RN,BSN NCM [phone] IR-DYE, ?TRACT OBSTRUCTION.?SX.UROLOGY FOLLOWING.IF HH NEEDED CAN ARRANGE.WILL PROVIDE HHC AGENCY LIST AS RESOURCE-USED [facility] IN PAST WILL USE AGAIN IF NEEDED. LOVENOX/GENERIC TIER 1-CO PAY $10.

## 2012-05-14 NOTE — Telephone Encounter (Signed)
No .. We can do the echo in the office.  Thanks.  -Dr. Rennis Golden

## 2012-05-14 NOTE — Telephone Encounter (Signed)
Returned call and spoke w/ Agustin Cree, pt's wife.  Informed per Dr. Rennis Golden that echo can be done in the office.  Darlene advised to call back after discharge to reschedule.  Darlene agreed w/ plan.

## 2012-05-15 ENCOUNTER — Inpatient Hospital Stay (HOSPITAL_COMMUNITY): Payer: BC Managed Care – PPO

## 2012-05-15 DIAGNOSIS — I517 Cardiomegaly: Secondary | ICD-10-CM

## 2012-05-15 DIAGNOSIS — A4901 Methicillin susceptible Staphylococcus aureus infection, unspecified site: Secondary | ICD-10-CM

## 2012-05-15 LAB — BASIC METABOLIC PANEL
BUN: 7 mg/dL (ref 6–23)
CO2: 27 mEq/L (ref 19–32)
Glucose, Bld: 126 mg/dL — ABNORMAL HIGH (ref 70–99)
Potassium: 3.6 mEq/L (ref 3.5–5.1)
Sodium: 141 mEq/L (ref 135–145)

## 2012-05-15 LAB — CBC
HCT: 38.1 % — ABNORMAL LOW (ref 39.0–52.0)
Hemoglobin: 12.4 g/dL — ABNORMAL LOW (ref 13.0–17.0)
MCH: 29.7 pg (ref 26.0–34.0)
MCHC: 32.5 g/dL (ref 30.0–36.0)
RBC: 4.17 MIL/uL — ABNORMAL LOW (ref 4.22–5.81)

## 2012-05-15 LAB — URINE CULTURE: Colony Count: >=100000

## 2012-05-15 LAB — GLUCOSE, CAPILLARY: Glucose-Capillary: 120 mg/dL — ABNORMAL HIGH (ref 70–99)

## 2012-05-15 MED ORDER — GLUCERNA SHAKE PO LIQD
237.0000 mL | Freq: Two times a day (BID) | ORAL | Status: DC
  Administered 2012-05-15 – 2012-05-17 (×4): 237 mL via ORAL
  Filled 2012-05-15 (×5): qty 237

## 2012-05-15 MED ORDER — DIATRIZOATE MEGLUMINE 30 % UR SOLN
Freq: Once | URETHRAL | Status: AC | PRN
  Administered 2012-05-15: 60 mL

## 2012-05-15 NOTE — Progress Notes (Signed)
Subjective:  1 - Bladder Caner - Pt s/p cystectomy with ileal conduit urinary diversion 02/2012 for refracotry recurrent high-grade bladder cancer. Final stage [id number].   2 - UTI / Maliase - Pt currently admitted with fevers (102 on admit) and bacteruria worrisome for pyelonephrisits. Fevers now resolved on emperic Rocephin + Vanc. Blood CX's positive and ID consultation assisting.  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 Recent CT <1. He did however have left ureteral-anastamotic leak necessitating antegrade JJ stent by IR 03/2012. Subsequent left sided antegrade nephrostogram revealed no residual leak, but distal end of stent unable to be retrieved via office endoscopy raising concern again about the location and integrity of left ureteral anastomosis. Repeat studies 5/14 with CT loopogram again concerning as Rt side did freely reflux (worriseom for partial obstruction) and left ureteral stent distal end not in conduit.  Pt with recent incidental PE now on Lovenox.    Objective: Vital signs in last 24 hours: Temp:  [98.3 F (36.8 C)-98.7 F (37.1 C)] 98.7 F (37.1 C) (05/14 1338) Pulse Rate:  [78-91] 78 (05/14 1338) Resp:  [17] 17 (05/14 0522) BP: (116-122)/(71-77) 122/77 mmHg (05/14 1338) SpO2:  [96 %-97 %] 97 % (05/14 1338) Last BM Date: 05/13/12  Intake/Output from previous day: 05/13 0701 - 05/14 0700 In: 1730 [P.O.:1680; IV Piggyback:50] Out: 4700 [Urine:4700] Intake/Output this shift: Total I/O In: 480 [P.O.:480] Out: 700 [Urine:700]  General appearance: alert, cooperative, appears stated age and family at bedside Head: Normocephalic, without obvious abnormality, atraumatic Eyes: conjunctivae/corneas clear. PERRL, EOM's intact. Fundi benign. Ears: normal TM's and external ear canals both ears Nose: Nares normal. Septum midline. Mucosa normal.  No drainage or sinus tenderness. Throat: lips, mucosa, and tongue normal; teeth and gums normal Neck: no adenopathy, no carotid bruit, no JVD, supple, symmetrical, trachea midline and thyroid not enlarged, symmetric, no tenderness/mass/nodules Back: symmetric, no curvature. ROM normal. No CVA tenderness. Resp: clear to auscultation bilaterally Chest wall: no tenderness Cardio: regular rate and rhythm, S1, S2 normal, no murmur, click, rub or gallop GI: soft, non-tender; bowel sounds normal; no masses,  no organomegaly and RLQ urostomy pink and patetn wtih clear urine. Lt neph tube capped and c/d/i. Male genitalia: normal Extremities: extremities normal, atraumatic, no cyanosis or edema Pulses: 2+ and symmetric Skin: Skin color, texture, turgor normal. No rashes or lesions Lymph nodes: Cervical, supraclavicular, and axillary nodes normal. Neurologic: Grossly normal  Lab Results:   Recent Labs  05/14/12 0433 05/15/12 0410  WBC 6.7 6.0  HGB 12.8* 12.4*  HCT 38.6* 38.1*  PLT 354 350   BMET  Recent Labs  05/14/12 0433 05/15/12 0410  NA 142 141  K 3.2* 3.6  CL 106 105  CO2 28 27  GLUCOSE 121* 126*  BUN 9 7  CREATININE 0.92 0.94  CALCIUM 8.7 8.5   PT/INR  Recent Labs  05/14/12 0433  LABPROT 14.1  INR 1.10   ABG No results found for this basename: PHART, PCO2, PO2, HCO3,  in the last 72 hours  Studies/Results: Dg Chest 2 View  05/13/2012   *RADIOLOGY REPORT*  Clinical Data: Fever, shortness of breath.  CHEST - 2 VIEW  Comparison: 02/12/2012  Findings: Heart is normal size.  No confluent airspace opacities or effusions.  No acute bony abnormality.  IMPRESSION: No acute findings.   Original Report Authenticated By: Charlett Nose, M.D.   Ct Head Wo  Contrast  05/14/2012   *RADIOLOGY REPORT*  Clinical Data: Dizzy with confusion.  History of bladder cancer.  CT HEAD WITHOUT CONTRAST  Technique:  Contiguous axial images were obtained from the base of the skull through the  vertex without contrast.  Comparison: None.  Findings: There is no evidence for acute infarction, intracranial hemorrhage, mass lesion, or hydrocephalus.  Slight posterior fossa hygroma on the left of doubtful significance. Mild atrophy.  Mild chronic microvascular ischemic change.  Calvarium intact.  No sinus disease.  Chronic right mastoiditis.  IMPRESSION: No acute intracranial findings.  Slight left posterior fossa extra- axial collection, non compressive, consistent with incidental hygroma.    No intracranial mass lesion to suggest metastatic disease.   Original Report Authenticated By: Davonna Belling, M.D.   Ct Pelvis Wo Contrast  05/15/2012   *RADIOLOGY REPORT*  Clinical Data: Verify location of the distal left ureteral stent.  CT PELVIS WITHOUT CONTRAST  Technique:  Multidetector CT imaging of the pelvis was performed following the standard protocol without intravenous contrast.  Comparison: Loopogram 05/15/2012 and CT abdomen pelvis 05/13/2012.  Findings: There is residual contrast in the right lower quadrant ileal conduit from loopogram performed just prior to the CT.  A left ureteral stent is in place.  Proximal loop is not imaged.  The distal loop is located in the distal left ureter, rather than the ileal conduit (image 19).  The visualized portion of the right ureter shows no hydronephrosis.  There is no contrast within the right ureter from previous loopogram performed just prior to the CT.  Retained contrast is seen in the colon.  Visualized bowel is otherwise unremarkable.  Postoperative changes of cystoprostatectomy.  No free fluid.  No pathologically enlarged lymph nodes.  No worrisome lytic or sclerotic lesions.  IMPRESSION:  1.  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  Distal loop of the left ureteral stent is seen in the distal left ureter, rather than the ileal conduit. 2.  Right hydronephrosis.  Lack of reflux of contrast into the right ureter from  the loopogram performed just prior is worrisome for a distal right ureteral stricture.   Original Report Authenticated By: Leanna Battles, M.D.   Ct Abdomen Pelvis W Contrast  05/13/2012   *RADIOLOGY REPORT*  Clinical Data: Chest pain, short of breath, history of bladder carcinoma  CT ABDOMEN AND PELVIS WITH CONTRAST  Technique:  Multidetector CT imaging of the abdomen and pelvis was performed following the standard protocol during bolus administration of intravenous contrast.  Contrast: 50mL OMNIPAQUE IOHEXOL 300 MG/ML  SOLN, OMNIPAQUE IOHEXOL 300 MG/ML  SOLN  Comparison: CT abdomen pelvis of 03/25/2012  Findings: The lung bases are clear.  The liver enhances with no focal abnormality and no ductal dilatation is noted.  No calcified gallstones are seen although there is slightly higher attenuation debris layering in the gallbladder which may represent gallbladder sludge or noncalcified gallstones.  The pancreas is normal in size and the pancreatic duct is not dilated.  The adrenal glands and spleen are unremarkable.  The stomach is not well distended.  The kidneys faintly enhance and there is a left percutaneous nephrostomy as well as a left double-J stent present within the left renal pelvis.  Bilateral renal cysts are noted.  There is moderate right hydronephrosis which has increased slightly since the prior CT.  The right ureter is dilated to the insertion into the ileal conduit.  The previously described oval collection of fluid posterior to the insertion  of the distal right ureter into the ileal conduit again is noted probably representing urinoma. 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  Stenosis of the distal right ureter at this site cannot be excluded in view of the slightly increased right hydronephrosis.  A complex cyst is noted posteriorly in the left kidney.  The position of the distal aspect of the double-J stent on the left is  uncertain, possibly either within the distal left ureter near the or near the insertion into the ileal conduit.  The patient has undergone excision of the prostate and urinary bladder.  There is a small amount of fluid within the pelvis.  This would be unlikely to be postoperative in nature in view of the time interval since the prior surgery and therefore a leak cannot be excluded.  Clinical correlation is recommended.  No abnormality of the colon is seen.  The terminal ileum and the appendix are unremarkable.  IMPRESSION:  1.  Slight increase in degree of right hydronephrosis and right hydroureter to the point of insertion into the ileal conduit. Cannot exclude mild distal right ureteral stricture. 2.  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  3.  Some fluid is noted within the pelvis, somewhat unusual for postop fluid in view of the time interval since the surgery. Therefore cannot exclude a leak.   Original Report Authenticated By: Dwyane Dee, M.D.   Dg Loopogram  05/15/2012   *RADIOLOGY REPORT*  Clinical Data: Right hydronephrosis.  Evaluate for ureteral leak.  LOOPOGRAM  Comparison: CT abdomen pelvis 05/13/2012.  Findings: Scout view of the abdomen shows a left ureteral stent with the distal loop projecting over the right lower quadrant.  A percutaneous nephrostomy is in place on the left.  There are extensive postoperative changes in the right abdomen.  Retained contrast is seen in the distal colon.  A 12-French Foley catheter was inserted into the right lower quadrant ileal conduit and balloon inflated.  Approximately 50-60 ml of Cystografin was hand injected.  There is opacification of the ileal conduit with reflux of contrast into the distal left ureter. The distal loop of the left ureteral stent partially projects over the ileal conduit, but not completely.  There is no reflux of contrast into the right ureter.  IMPRESSION:  1.  Distal loop of the left ureteral stent may be  within the distal left ureter. 2.  Right ureter did not opacify, which can be seen with a distal ureteral stricture. 3. These results were called by telephone on 05/15/2012 at 1215 hours to Dr. Berneice Heinrich, who verbally acknowledged these results. The patient will undergo with CT pelvis without contrast to confirm left ureteral stent position.   Original Report Authenticated By: Leanna Battles, M.D.    Anti-infectives: Anti-infectives   Start     Dose/Rate Route Frequency Ordered Stop   05/14/12 1800  ceFAZolin (ANCEF) IVPB 2 g/50 mL premix     2 g 100 mL/hr over 30 Minutes Intravenous Every 8 hours 05/14/12 1732     05/14/12 1000  vancomycin (VANCOCIN) IVPB 1000 mg/200 mL premix     1,000 mg 200 mL/hr over 60 Minutes Intravenous Every 12 hours 05/13/12 2137     05/13/12 2200  vancomycin (VANCOCIN) 2,000 mg in sodium chloride 0.9 % 500 mL IVPB     2,000 mg 250 mL/hr over 120 Minutes Intravenous  Once 05/13/12 2135 05/14/12 0004   05/13/12 2145  cefTRIAXone (ROCEPHIN) 1 g in dextrose 5 % 50 mL IVPB  Status:  Discontinued     1 g 100 mL/hr over 30 Minutes Intravenous Every 24 hours 05/13/12 2140 05/14/12 1658      Assessment/Plan:  1 - Bladder Caner - No worrisome findings for recurrent disease by recent imaging.  2 - UTI / Maliase - Greatly appreciate ID and hospitalist help. Pt subjectively much improved on current therapy.  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 Fortunately pt now afebrile and renal function in normal range. I feel he will need open revision of anastomoses once infectious parameters resolved and he can be off anticoagulation x 2 weeks. This will likely be in elective setting.   [facility], Chad Olson 05/15/2012

## 2012-05-15 NOTE — Progress Notes (Signed)
[facility] PROGRESS NOTE  Chad Olson WUJ:[id number] DOB: 06-29-1945 DOA: 05/13/2012 PCP: [name], MD  Primary Urologist: Dr. Sebastian Ache  Brief narrative 67 year old male patient with history of bladder cancer, status post cystectomy with ileal conduit urinary diversion for refractory recurrent high-grade bladder cancer, urinoma/left ureteral leak/right hydronephrosis-underwent multiple urologic procedures-has left nephrostomy, recent incidental PE on full dose Lovenox, HTN, HL, diet-controlled DM 2 was admitted on 05/13/12 with complaints of fever, nausea, vomiting and malaise. In ED, repeat CT showed worsening right-sided hydronephrosis. Hospitalist admission requested.  Assessment/Plan: 1. Recurrent UTI/pyelonephritis (now Gram neg rods): Continue IV Cefazolin pending final culture results. ID input appreciated. 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 Staphylococcus Bacteremia (2/2  Blood cultures 5/12: gram-positive cocci in clusters)-? Source-urinary source. Continue IV vancomycin & Cefazolin pending final culture sensitivity results. ID input appreciated-recommend TTE, possible removal of left nephrostomy tube and ureteral stents and repeat surveillance blood cultures x2 on 5/14. Discussed with Dr. Berneice Heinrich, urology-will followup after imaging studies regarding further planning for removal of nephrostomy and stents. 3. Bladder cancer/worsening right hydronephrosis: Complicated urology history with multiple procedures. Management per urology. 4. VTE: Discussed with urology and resumed full dose Lovenox. 5. Hypokalemia: Replaced. 6. Nausea and vomiting:secondary to acute illness-UTI, bacteremia and hydronephrosis. Monitor on current diet.-Tolerating well 7. Altered mental status: Unclear etiology. No focal deficits. Follow CT head-no acute findings.? Related to infections. Per family-seems to have resolved. 8. HTN: Controlled. 9. DM 2: Check CBGs and SSI. 10. Incidental left  posterior hygroma on CT head, without mass effect : OP follow up.  Code Status: Full Family Communication: Discussed with patient's sister at bedside Disposition Plan: Remains inpatient. Not medically ready for discharge.   Consultants:  Urology  Infectious disease  Procedures:  None  Antibiotics:  IV Rocephin 5/12 > 5/13  IV vancomycin 5/12 >   IV cefazolin 5/13 >  HPI/Subjective:  denies complaints. Tolerating diet   Objective: Filed Vitals:   05/14/12 1425 05/14/12 2201 05/15/12 0522 05/15/12 1338  BP: 112/71 120/71 116/74 122/77  Pulse: 85 91 78 78  Temp: 98.6 F (37 C) 98.3 F (36.8 C) 98.5 F (36.9 C) 98.7 F (37.1 C)  TempSrc: Oral Oral Oral Oral  Resp: 18 17 17    Height:      Weight:      SpO2: 98% 97% 96% 97%    Intake/Output Summary (Last 24 hours) at 05/15/12 1422 Last data filed at 05/15/12 1339  Gross per 24 hour  Intake   1250 ml  Output   4300 ml  Net  -3050 ml   Filed Weights   05/13/12 2100 05/13/12 2319 05/14/12 0900  Weight: 106.7 kg (235 lb 3.7 oz) 98.521 kg (217 lb 3.2 oz) 98.158 kg (216 lb 6.4 oz)    Exam:   General exam: Comfortable.  Respiratory system: Clear. No increased work of breathing.  Cardiovascular system: S1 & S2 heard, RRR. No JVD, murmurs, gallops, clicks or pedal edema. Telemetry: sinus rhythm mostly. Occasional sinus tachycardia.  Gastrointestinal system: Abdomen is nondistended, soft and nontender. Normal bowel sounds heard. Right-sided ileal conduit intact. Left nephrostomy intact.  Central nervous system: Alert and oriented. No focal neurological deficits.  Extremities: Symmetric 5 x 5 power.   Data Reviewed: Basic Metabolic Panel:  Recent Labs Lab 05/13/12 1650 05/13/12 1700 05/14/12 0433 05/15/12 0410  NA 134* 136 142 141  K 3.1* 3.1* 3.2* 3.6  CL 97 99 106 105  CO2 26  --  28 27  GLUCOSE 143* 148* 121* 126*  BUN 12 12 9 7   CREATININE 1.05 1.00 0.92 0.94  CALCIUM 8.8  --  8.7 8.5    Liver Function Tests: No results found for this basename: AST, ALT, ALKPHOS, BILITOT, PROT, ALBUMIN,  in the last 168 hours  Recent Labs Lab 05/13/12 1650  LIPASE 25   No results found for this basename: AMMONIA,  in the last 168 hours CBC:  Recent Labs Lab 05/13/12 1650 05/13/12 1700 05/14/12 0433 05/15/12 0410  WBC 7.4  --  6.7 6.0  HGB 13.4 14.3 12.8* 12.4*  HCT 40.3 42.0 38.6* 38.1*  MCV 89.6  --  90.6 91.4  PLT 363  --  354 350   Cardiac Enzymes: No results found for this basename: CKTOTAL, CKMB, CKMBINDEX, TROPONINI,  in the last 168 hours BNP (last 3 results) No results found for this basename: PROBNP,  in the last 8760 hours CBG:  Recent Labs Lab 05/14/12 1744 05/14/12 2200 05/15/12 0711 05/15/12 1336  GLUCAP 171* 194* 120* 144*    Recent Results (from the past 240 hour(s))  CULTURE, BLOOD (ROUTINE X 2)     Status: None   Collection Time    05/13/12  6:40 PM      Result Value Range Status   Specimen Description BLOOD LEFT ARM   Final   Special Requests BOTTLES DRAWN AEROBIC AND ANAEROBIC 4CC   Final   Culture  Setup Time 05/13/2012 23:52   Final   Culture     Final   Value: STAPHYLOCOCCUS AUREUS     Note: RIFAMPIN AND GENTAMICIN SHOULD NOT BE USED AS SINGLE DRUGS FOR TREATMENT OF STAPH INFECTIONS.     Note: Gram Stain Report Called to,Read Back By and Verified With: GRACE SISON 05/14/12 1525 BY SMITHERSJ   Report Status PENDING   Incomplete  URINE CULTURE     Status: None   Collection Time    05/13/12  6:42 PM      Result Value Range Status   Specimen Description URINE, SUPRAPUBIC   Final   Special Requests NONE   Final   Culture  Setup Time 05/14/2012 02:09   Final   Colony Count >=100,000 COLONIES/ML   Final   Culture GRAM NEGATIVE RODS   Final   Report Status PENDING   Incomplete  CULTURE, BLOOD (ROUTINE X 2)     Status: None   Collection Time    05/13/12  6:50 PM      Result Value Range Status   Specimen Description BLOOD RIGHT ARM    Final   Special Requests BOTTLES DRAWN AEROBIC AND ANAEROBIC 2CC   Final   Culture  Setup Time 05/13/2012 23:52   Final   Culture GRAM POSITIVE COCCI IN CLUSTERS   Final   Report Status PENDING   Incomplete  MRSA PCR SCREENING     Status: Abnormal   Collection Time    05/14/12 12:41 AM      Result Value Range Status   MRSA by PCR POSITIVE (*) NEGATIVE Final   Comment:            The GeneXpert MRSA Assay (FDA     approved for NASAL specimens     only), is one component of a     comprehensive MRSA colonization     surveillance program. It is not     intended to diagnose MRSA     infection nor to guide or     monitor treatment for  MRSA infections.     RESULT CALLED TO, READ BACK BY AND VERIFIED WITH:     L. HARRIS RN AT 0310 ON 05.13.14 BY SHUEA     Studies: Dg Chest 2 View  05/13/2012   *RADIOLOGY REPORT*  Clinical Data: Fever, shortness of breath.  CHEST - 2 VIEW  Comparison: 02/12/2012  Findings: Heart is normal size.  No confluent airspace opacities or effusions.  No acute bony abnormality.  IMPRESSION: No acute findings.   Original Report Authenticated By: Charlett Nose, M.D.   Ct Head Wo Contrast  05/14/2012   *RADIOLOGY REPORT*  Clinical Data: Dizzy with confusion.  History of bladder cancer.  CT HEAD WITHOUT CONTRAST  Technique:  Contiguous axial images were obtained from the base of the skull through the vertex without contrast.  Comparison: None.  Findings: There is no evidence for acute infarction, intracranial hemorrhage, mass lesion, or hydrocephalus.  Slight posterior fossa hygroma on the left of doubtful significance. Mild atrophy.  Mild chronic microvascular ischemic change.  Calvarium intact.  No sinus disease.  Chronic right mastoiditis.  IMPRESSION: No acute intracranial findings.  Slight left posterior fossa extra- axial collection, non compressive, consistent with incidental hygroma.    No intracranial mass lesion to suggest metastatic disease.   Original Report  Authenticated By: Davonna Belling, M.D.   Ct Pelvis Wo Contrast  05/15/2012   *RADIOLOGY REPORT*  Clinical Data: Verify location of the distal left ureteral stent.  CT PELVIS WITHOUT CONTRAST  Technique:  Multidetector CT imaging of the pelvis was performed following the standard protocol without intravenous contrast.  Comparison: Loopogram 05/15/2012 and CT abdomen pelvis 05/13/2012.  Findings: There is residual contrast in the right lower quadrant ileal conduit from loopogram performed just prior to the CT.  A left ureteral stent is in place.  Proximal loop is not imaged.  The distal loop is located in the distal left ureter, rather than the ileal conduit (image 19).  The visualized portion of the right ureter shows no hydronephrosis.  There is no contrast within the right ureter from previous loopogram performed just prior to the CT.  Retained contrast is seen in the colon.  Visualized bowel is otherwise unremarkable.  Postoperative changes of cystoprostatectomy.  No free fluid.  No pathologically enlarged lymph nodes.  No worrisome lytic or sclerotic lesions.  IMPRESSION:  1.  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  Distal loop of the left ureteral stent is seen in the distal left ureter, rather than the ileal conduit. 2.  Right hydronephrosis.  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   Original Report Authenticated By: Leanna Battles, M.D.   Ct Abdomen Pelvis W Contrast  05/13/2012   *RADIOLOGY REPORT*  Clinical Data: Chest pain, short of breath, history of bladder carcinoma  CT ABDOMEN AND PELVIS WITH CONTRAST  Technique:  Multidetector CT imaging of the abdomen and pelvis was performed following the standard protocol during bolus administration of intravenous contrast.  Contrast: 50mL OMNIPAQUE IOHEXOL 300 MG/ML  SOLN, OMNIPAQUE IOHEXOL 300 MG/ML  SOLN   Comparison: CT abdomen pelvis of 03/25/2012  Findings: The lung bases are clear.  The liver enhances with no focal abnormality and no ductal dilatation is noted.  No calcified gallstones are seen although there is slightly higher attenuation debris layering in the gallbladder which may represent gallbladder sludge or noncalcified gallstones.  The pancreas is  normal in size and the pancreatic duct is not dilated.  The adrenal glands and spleen are unremarkable.  The stomach is not well distended.  The kidneys faintly enhance and there is a left percutaneous nephrostomy as well as a left double-J stent present within the left renal pelvis.  Bilateral renal cysts are noted.  There is moderate right hydronephrosis which has increased slightly since the prior CT.  The right ureter is dilated to the insertion into the ileal conduit.  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  Stenosis of the distal right ureter at this site cannot be excluded in view of the slightly increased right hydronephrosis.  A complex cyst is noted posteriorly in the left kidney.  The position of the distal aspect of the double-J stent on the left is uncertain, possibly either within the distal left ureter near the or near the insertion into the ileal conduit.  The patient has undergone excision of the prostate and urinary bladder.  There is a small amount of fluid within the pelvis.  This would be unlikely to be postoperative in nature in view of the time interval since the prior surgery and therefore a leak cannot be excluded.  Clinical correlation is recommended.  No abnormality of the colon is seen.  The terminal ileum and the appendix are unremarkable.  IMPRESSION:  1.  Slight increase in degree of right hydronephrosis  and right hydroureter to the point of insertion into the ileal conduit. Cannot exclude mild distal right ureteral stricture. 2.  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  3.  Some fluid is noted within the pelvis, somewhat unusual for postop fluid in view of the time interval since the surgery. Therefore cannot exclude a leak.   Original Report Authenticated By: Dwyane Dee, M.D.   Dg Loopogram  05/15/2012   *RADIOLOGY REPORT*  Clinical Data: Right hydronephrosis.  Evaluate for ureteral leak.  LOOPOGRAM  Comparison: CT abdomen pelvis 05/13/2012.  Findings: Scout view of the abdomen shows a left ureteral stent with the distal loop projecting over the right lower quadrant.  A percutaneous nephrostomy is in place on the left.  There are extensive postoperative changes in the right abdomen.  Retained contrast is seen in the distal colon.  A 12-French Foley catheter was inserted into the right lower quadrant ileal conduit and balloon inflated.  Approximately 50-60 ml of Cystografin was hand injected.  There is opacification of the ileal conduit with reflux of contrast into the distal left ureter. The distal loop of the left ureteral stent partially projects over the ileal conduit, but not completely.  There is no reflux of contrast into the right ureter.  IMPRESSION:  1.  Distal loop of the left ureteral stent may be within the distal left ureter. 2.  Right ureter did not opacify, which can be seen with a distal ureteral stricture. 3. These results were called by telephone on 05/15/2012 at 1215 hours to Dr. Berneice Heinrich, who verbally acknowledged these results. The patient will undergo with CT pelvis without contrast to confirm left ureteral stent position.   Original Report Authenticated By: Leanna Battles, M.D.     Additional labs:   Scheduled Meds: .  ceFAZolin (ANCEF) IV  2 g Intravenous Q8H  . Chlorhexidine Gluconate  Cloth  6 each Topical [id number]  . enoxaparin (LOVENOX) injection  1  mg/kg Subcutaneous Q12H  . feeding supplement  237 mL Oral BID BM  . insulin aspart  0-9 Units Subcutaneous TID WC  . mirtazapine  15 mg Oral Daily  . multivitamin with minerals  1 tablet Oral Daily  . mupirocin ointment  1 application Nasal BID  . pantoprazole  40 mg Oral Daily  . sodium chloride  3 mL Intravenous Q12H  . vancomycin  1,000 mg Intravenous Q12H   Continuous Infusions:    Principal Problem:   UTI (lower urinary tract infection) Active Problems:   Bladder cancer   Acute pulmonary embolism (3/14)   Nephrostomy status   Bilateral hydronephrosis   DVT (deep venous thrombosis) 3/14   Fever   Bacteremia    Time spent: 45 minutes.    [facility]  [facility] Pager [phone].   If 8PM-8AM, please contact night-coverage at [email or web address], password [facility] 05/15/2012, 2:22 PM  LOS: 2 days

## 2012-05-15 NOTE — Progress Notes (Signed)
Received report from lab that pt's blood cultures were positive for gram positive cocci clusters in both aerobic and anaerobic bottles. Merdis Delay, NP notified. No new orders at this time. Will continue to monitor. [employer]
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
## 2012-05-15 NOTE — Progress Notes (Signed)
  Echocardiogram 2D Echocardiogram has been performed.  Jorje Guild 05/15/2012, 1:41 PM

## 2012-05-16 ENCOUNTER — Ambulatory Visit (HOSPITAL_COMMUNITY): Payer: BC Managed Care – PPO

## 2012-05-16 DIAGNOSIS — I749 Embolism and thrombosis of unspecified artery: Secondary | ICD-10-CM

## 2012-05-16 DIAGNOSIS — I8290 Acute embolism and thrombosis of unspecified vein: Secondary | ICD-10-CM | POA: Diagnosis present

## 2012-05-16 LAB — CULTURE, BLOOD (ROUTINE X 2)

## 2012-05-16 LAB — GLUCOSE, CAPILLARY
Glucose-Capillary: 109 mg/dL — ABNORMAL HIGH (ref 70–99)
Glucose-Capillary: 138 mg/dL — ABNORMAL HIGH (ref 70–99)
Glucose-Capillary: 136 mg/dL — ABNORMAL HIGH (ref 70–99)

## 2012-05-16 MED ORDER — SULFAMETHOXAZOLE-TMP DS 800-160 MG PO TABS
1.0000 | ORAL_TABLET | Freq: Two times a day (BID) | ORAL | Status: DC
  Administered 2012-05-16 – 2012-05-17 (×3): 1 via ORAL
  Filled 2012-05-16 (×4): qty 1

## 2012-05-16 NOTE — Progress Notes (Signed)
Subjective:  1 - Bladder Caner - Pt s/p cystectomy with ileal conduit urinary diversion 02/2012 for refracotry recurrent high-grade bladder cancer. Final stage [id number].   2 - UTI / Maliase - Pt currently admitted with fevers (102 on admit) and bacteruria worrisome for pyelonephrisits. Fevers now resolved on emperic Rocephin + Vanc. Blood CX's positive and ID consultation assisting.  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 Recent CT <1. He did however have left ureteral-anastamotic leak necessitating antegrade JJ stent by IR 03/2012. Subsequent left sided antegrade nephrostogram revealed no residual leak, but distal end of stent unable to be retrieved via office endoscopy raising concern again about the location and integrity of left ureteral anastomosis. Repeat studies 5/14 with CT loopogram again concerning as Rt side did freely reflux (worriseom for partial obstruction) and left ureteral stent distal end not in conduit.  Pt with recent incidental PE now on Lovenox.   Today Chad Olson is feeling MUCH stronger. Appetite improved and much more energy. TTE yesterday not ideal study.   Objective: Vital signs in last 24 hours: Temp:  [98.1 F (36.7 C)-98.7 F (37.1 C)] 98.1 F (36.7 C) (05/15 0702) Pulse Rate:  [78-83] 82 (05/15 0702) Resp:  [18] 18 (05/15 0702) BP: (113-125)/(75-78) 125/78 mmHg (05/15 0702) SpO2:  [96 %-97 %] 96 % (05/15 0702) Last BM Date: 05/13/12  Intake/Output from previous day: 05/14 0701 - 05/15 0700 In: 1350 [P.O.:800; IV Piggyback:550] Out: 3800 [Urine:3800] Intake/Output this shift:    General appearance: alert, cooperative and appears stated age Head: Normocephalic, without obvious abnormality, atraumatic Eyes: conjunctivae/corneas clear. PERRL, EOM's intact. Fundi benign. Ears: normal TM's and external ear canals both ears Nose:  Nares normal. Septum midline. Mucosa normal. No drainage or sinus tenderness. Throat: lips, mucosa, and tongue normal; teeth and gums normal Neck: no adenopathy, no carotid bruit, no JVD, supple, symmetrical, trachea midline and thyroid not enlarged, symmetric, no tenderness/mass/nodules Back: symmetric, no curvature. ROM normal. No CVA tenderness. Resp: clear to auscultation bilaterally Chest wall: no tenderness Cardio: regular rate and rhythm, S1, S2 normal, no murmur, click, rub or gallop GI: soft, non-tender; bowel sounds normal; no masses,  no organomegaly and RLQ Urostomy pink / patent with clear urine. Lt neph utbe capped and c/d/i. Male genitalia: normal Extremities: extremities normal, atraumatic, no cyanosis or edema Pulses: 2+ and symmetric Skin: Skin color, texture, turgor normal. No rashes or lesions Lymph nodes: Cervical, supraclavicular, and axillary nodes normal. Neurologic: Grossly normal  Lab Results:   Recent Labs  05/14/12 0433 05/15/12 0410  WBC 6.7 6.0  HGB 12.8* 12.4*  HCT 38.6* 38.1*  PLT 354 350   BMET  Recent Labs  05/14/12 0433 05/15/12 0410  NA 142 141  K 3.2* 3.6  CL 106 105  CO2 28 27  GLUCOSE 121* 126*  BUN 9 7  CREATININE 0.92 0.94  CALCIUM 8.7 8.5   PT/INR  Recent Labs  05/14/12 0433  LABPROT 14.1  INR 1.10   ABG No results found for this basename: PHART, PCO2, PO2, HCO3,  in the last 72 hours  Studies/Results: Ct Head Wo Contrast  05/14/2012   *RADIOLOGY REPORT*  Clinical Data: Dizzy with confusion.  History of bladder cancer.  CT HEAD WITHOUT CONTRAST  Technique:  Contiguous axial images were obtained from the base of the skull through the vertex without contrast.  Comparison: None.  Findings: There is no  evidence for acute infarction, intracranial hemorrhage, mass lesion, or hydrocephalus.  Slight posterior fossa hygroma on the left of doubtful significance. Mild atrophy.  Mild chronic microvascular ischemic change.  Calvarium  intact.  No sinus disease.  Chronic right mastoiditis.  IMPRESSION: No acute intracranial findings.  Slight left posterior fossa extra- axial collection, non compressive, consistent with incidental hygroma.    No intracranial mass lesion to suggest metastatic disease.   Original Report Authenticated By: Davonna Belling, M.D.   Ct Pelvis Wo Contrast  05/15/2012   *RADIOLOGY REPORT*  Clinical Data: Verify location of the distal left ureteral stent.  CT PELVIS WITHOUT CONTRAST  Technique:  Multidetector CT imaging of the pelvis was performed following the standard protocol without intravenous contrast.  Comparison: Loopogram 05/15/2012 and CT abdomen pelvis 05/13/2012.  Findings: There is residual contrast in the right lower quadrant ileal conduit from loopogram performed just prior to the CT.  A left ureteral stent is in place.  Proximal loop is not imaged.  The distal loop is located in the distal left ureter, rather than the ileal conduit (image 19).  The visualized portion of the right ureter shows no hydronephrosis.  There is no contrast within the right ureter from previous loopogram performed just prior to the CT.  Retained contrast is seen in the colon.  Visualized bowel is otherwise unremarkable.  Postoperative changes of cystoprostatectomy.  No free fluid.  No pathologically enlarged lymph nodes.  No worrisome lytic or sclerotic lesions.  IMPRESSION:  1.  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  Distal loop of the left ureteral stent is seen in the distal left ureter, rather than the ileal conduit. 2.  Right hydronephrosis.  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   Original Report Authenticated By: Leanna Battles, M.D.   Dg Loopogram  05/15/2012   *RADIOLOGY REPORT*  Clinical Data: Right hydronephrosis.  Evaluate for ureteral leak.  LOOPOGRAM  Comparison: CT  abdomen pelvis 05/13/2012.  Findings: Scout view of the abdomen shows a left ureteral stent with the distal loop projecting over the right lower quadrant.  A percutaneous nephrostomy is in place on the left.  There are extensive postoperative changes in the right abdomen.  Retained contrast is seen in the distal colon.  A 12-French Foley catheter was inserted into the right lower quadrant ileal conduit and balloon inflated.  Approximately 50-60 ml of Cystografin was hand injected.  There is opacification of the ileal conduit with reflux of contrast into the distal left ureter. The distal loop of the left ureteral stent partially projects over the ileal conduit, but not completely.  There is no reflux of contrast into the right ureter.  IMPRESSION:  1.  Distal loop of the left ureteral stent may be within the distal left ureter. 2.  Right ureter did not opacify, which can be seen with a distal ureteral stricture. 3. These results were called by telephone on 05/15/2012 at 1215 hours to Dr. Berneice Heinrich, who verbally acknowledged these results. The patient will undergo with CT pelvis without contrast to confirm left ureteral stent position.   Original Report Authenticated By: Leanna Battles, M.D.    Anti-infectives: Anti-infectives   Start     Dose/Rate Route Frequency Ordered Stop   05/16/12 0800  sulfamethoxazole-trimethoprim (BACTRIM DS) 800-160 MG per tablet 1 tablet     1 tablet Oral Every 12 hours 05/16/12 0721  05/14/12 1800  ceFAZolin (ANCEF) IVPB 2 g/50 mL premix     2 g 100 mL/hr over 30 Minutes Intravenous Every 8 hours 05/14/12 1732     05/14/12 1000  vancomycin (VANCOCIN) IVPB 1000 mg/200 mL premix     1,000 mg 200 mL/hr over 60 Minutes Intravenous Every 12 hours 05/13/12 2137     05/13/12 2200  vancomycin (VANCOCIN) 2,000 mg in sodium chloride 0.9 % 500 mL IVPB     2,000 mg 250 mL/hr over 120 Minutes Intravenous  Once 05/13/12 2135 05/14/12 0004   05/13/12 2145  cefTRIAXone (ROCEPHIN) 1 g in  dextrose 5 % 50 mL IVPB  Status:  Discontinued     1 g 100 mL/hr over 30 Minutes Intravenous Every 24 hours 05/13/12 2140 05/14/12 1658      Assessment/Plan:  1 - Bladder Caner - No worrisome findings for recurrent disease by recent imaging.  2 - UTI / Maliase - Greatly appreciate ID and hospitalist help. Pt subjectively much improved on current therapy.  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 Fortunately pt now afebrile and renal function in normal range. I re-adressed that I feel he will need open revision of anastomoses once infectious parameters resolved and he can be off anticoagulation x 2 weeks. This will likely be in elective setting.   [facility], Emri Sample 05/16/2012

## 2012-05-16 NOTE — Progress Notes (Signed)
ANTIBIOTIC CONSULT NOTE - FOLLOW UP  Pharmacy Consult for Vanco Indication: MRSA Bacteremia  No Known Allergies  Patient Measurements: Height: 5\' 11"  (180.3 cm) Weight: 216 lb 6.4 oz (98.158 kg) IBW/kg (Calculated) : 75.3  Vital Signs: Temp: 98.1 F (36.7 C) (05/15 0702) Temp src: Oral (05/15 0702) BP: 125/78 mmHg (05/15 0702) Pulse Rate: 82 (05/15 0702)  Labs:  Recent Labs  05/13/12 1650 05/13/12 1700 05/14/12 0433 05/15/12 0410  WBC 7.4  --  6.7 6.0  HGB 13.4 14.3 12.8* 12.4*  PLT 363  --  354 350  CREATININE 1.05 1.00 0.92 0.94   Estimated Creatinine Clearance: 92.4 ml/min (by C-G formula based on Cr of 0.94). No results found for this basename: VANCOTROUGH, VANCOPEAK, VANCORANDOM, GENTTROUGH, GENTPEAK, GENTRANDOM, TOBRATROUGH, TOBRAPEAK, TOBRARND, AMIKACINPEAK, AMIKACINTROU, AMIKACIN,  in the last 72 hours   Microbiology: Recent Results (from the past 720 hour(s))  CULTURE, BLOOD (ROUTINE X 2)     Status: None   Collection Time    05/13/12  6:40 PM      Result Value Range Status   Specimen Description BLOOD LEFT ARM   Final   Special Requests BOTTLES DRAWN AEROBIC AND ANAEROBIC 4CC   Final   Culture  Setup Time 05/13/2012 23:52   Final   Culture     Final   Value: METHICILLIN RESISTANT STAPHYLOCOCCUS AUREUS     Note: RIFAMPIN AND GENTAMICIN SHOULD NOT BE USED AS SINGLE DRUGS FOR TREATMENT OF STAPH INFECTIONS. CRITICAL RESULT CALLED TO, READ BACK BY AND VERIFIED WITH: VASHONDA F @ [phone] 05/16/12 BY KRAWS     Note: Gram Stain Report Called to,Read Back By and Verified With: GRACE SISON 05/14/12 1525 BY SMITHERSJ   Report Status 05/16/2012 FINAL   Final   Organism ID, Bacteria METHICILLIN RESISTANT STAPHYLOCOCCUS AUREUS   Final  URINE CULTURE     Status: None   Collection Time    05/13/12  6:42 PM      Result Value Range Status   Specimen Description URINE, SUPRAPUBIC   Final   Special Requests NONE   Final   Culture  Setup Time 05/14/2012 02:09   Final   Colony  Count >=100,000 COLONIES/ML   Final   Culture SERRATIA LIQUEFACIENS   Final   Report Status 05/15/2012 FINAL   Final   Organism ID, Bacteria SERRATIA LIQUEFACIENS   Final  CULTURE, BLOOD (ROUTINE X 2)     Status: None   Collection Time    05/13/12  6:50 PM      Result Value Range Status   Specimen Description BLOOD RIGHT ARM   Final   Special Requests BOTTLES DRAWN AEROBIC AND ANAEROBIC 2CC   Final   Culture  Setup Time 05/13/2012 23:52   Final   Culture     Final   Value: STAPHYLOCOCCUS AUREUS     Note: SUSCEPTIBILITIES PERFORMED ON PREVIOUS CULTURE WITHIN THE LAST 5 DAYS.     Note: Gram Stain Report Called to,Read Back By and Verified With: MONICA CLAYTON 05/15/12 AT 0210 RIDK   Report Status 05/16/2012 FINAL   Final  MRSA PCR SCREENING     Status: Abnormal   Collection Time    05/14/12 12:41 AM      Result Value Range Status   MRSA by PCR POSITIVE (*) NEGATIVE Final   Comment:            The GeneXpert MRSA Assay (FDA     approved for NASAL specimens  only), is one component of a     comprehensive MRSA colonization     surveillance program. It is not     intended to diagnose MRSA     infection nor to guide or     monitor treatment for     MRSA infections.     RESULT CALLED TO, READ BACK BY AND VERIFIED WITH:     L. HARRIS RN AT 0310 ON 05.13.14 BY SHUEA  CULTURE, BLOOD (ROUTINE X 2)     Status: None   Collection Time    05/15/12  1:57 PM      Result Value Range Status   Specimen Description BLOOD LEFT HAND   Final   Special Requests BOTTLES DRAWN AEROBIC ONLY 3CC   Final   Culture  Setup Time 05/15/2012 17:05   Final   Culture     Final   Value:        BLOOD CULTURE RECEIVED NO GROWTH TO DATE CULTURE WILL BE HELD FOR 5 DAYS BEFORE ISSUING A FINAL NEGATIVE REPORT   Report Status PENDING   Incomplete  CULTURE, BLOOD (ROUTINE X 2)     Status: None   Collection Time    05/15/12  2:05 PM      Result Value Range Status   Specimen Description BLOOD RIGHT ARM   Final    Special Requests BOTTLES DRAWN AEROBIC AND ANAEROBIC 5CC   Final   Culture  Setup Time 05/15/2012 17:05   Final   Culture     Final   Value:        BLOOD CULTURE RECEIVED NO GROWTH TO DATE CULTURE WILL BE HELD FOR 5 DAYS BEFORE ISSUING A FINAL NEGATIVE REPORT   Report Status PENDING   Incomplete    Anti-infectives   Start     Dose/Rate Route Frequency Ordered Stop   05/16/12 0800  sulfamethoxazole-trimethoprim (BACTRIM DS) 800-160 MG per tablet 1 tablet     1 tablet Oral Every 12 hours 05/16/12 0721     05/14/12 1800  ceFAZolin (ANCEF) IVPB 2 g/50 mL premix     2 g 100 mL/hr over 30 Minutes Intravenous Every 8 hours 05/14/12 1732     05/14/12 1000  vancomycin (VANCOCIN) IVPB 1000 mg/200 mL premix     1,000 mg 200 mL/hr over 60 Minutes Intravenous Every 12 hours 05/13/12 2137     05/13/12 2200  vancomycin (VANCOCIN) 2,000 mg in sodium chloride 0.9 % 500 mL IVPB     2,000 mg 250 mL/hr over 120 Minutes Intravenous  Once 05/13/12 2135 05/14/12 0004   05/13/12 2145  cefTRIAXone (ROCEPHIN) 1 g in dextrose 5 % 50 mL IVPB  Status:  Discontinued     1 g 100 mL/hr over 30 Minutes Intravenous Every 24 hours 05/13/12 2140 05/14/12 1658      Assessment: 67 yo M on Day #4 Vanco and Day #3 ancef for bacteremia, plus Day #1 Bactrim for complicated serratia UTI. 1 of the blood cultures has now come back as growing MRSA (2nd BCx doesn't have sensitivities back yet). Discussed this with Dr. Luciana Axe (ID) who gave ok to d/c ancef. Will check Vanco trough tonight since this is confirmed MRSA bacteremia.  Goal of Therapy:  Vancomycin trough level 15-20 mcg/ml  Plan:  1) D/C Ancef (v.o. Dr. Luciana Axe) 2) Vanco trough tonight - pharmacy will f/u  Darrol Angel, PharmD Pager: [phone] 05/16/2012,2:31 PM

## 2012-05-16 NOTE — Progress Notes (Signed)
[facility]  Date of Admission:  05/13/2012  Antibiotics: Vancomycin day 4 Cefazolin day 3 Bactrim day 1  Subjective: Patient currently in bathroom  Objective: Temp:  [98.1 F (36.7 C)-98.7 F (37.1 C)] 98.1 F (36.7 C) (05/15 0702) Pulse Rate:  [78-83] 82 (05/15 0702) Resp:  [18] 18 (05/15 0702) BP: (113-125)/(75-78) 125/78 mmHg (05/15 0702) SpO2:  [96 %-97 %] 96 % (05/15 0702)  Not examined, patient not available  Lab Results Lab Results  Component Value Date   WBC 6.0 05/15/2012   HGB 12.4* 05/15/2012   HCT 38.1* 05/15/2012   MCV 91.4 05/15/2012   PLT 350 05/15/2012    Lab Results  Component Value Date   CREATININE 0.94 05/15/2012   BUN 7 05/15/2012   NA 141 05/15/2012   K 3.6 05/15/2012   CL 105 05/15/2012   CO2 27 05/15/2012    Lab Results  Component Value Date   ALT 22 04/18/2012   AST 16 04/18/2012   ALKPHOS 82 04/18/2012   BILITOT 0.46 04/18/2012      Microbiology: Recent Results (from the past 240 hour(s))  CULTURE, BLOOD (ROUTINE X 2)     Status: None   Collection Time    05/13/12  6:40 PM      Result Value Range Status   Specimen Description BLOOD LEFT ARM   Final   Special Requests BOTTLES DRAWN AEROBIC AND ANAEROBIC 4CC   Final   Culture  Setup Time 05/13/2012 23:52   Final   Culture     Final   Value: STAPHYLOCOCCUS AUREUS     Note: RIFAMPIN AND GENTAMICIN SHOULD NOT BE USED AS SINGLE DRUGS FOR TREATMENT OF STAPH INFECTIONS.     Note: Gram Stain Report Called to,Read Back By and Verified With: GRACE SISON 05/14/12 1525 BY SMITHERSJ   Report Status PENDING   Incomplete  URINE CULTURE     Status: None   Collection Time    05/13/12  6:42 PM      Result Value Range Status   Specimen Description URINE, SUPRAPUBIC   Final   Special Requests NONE   Final   Culture  Setup Time 05/14/2012 02:09   Final   Colony Count >=100,000 COLONIES/ML   Final   Culture SERRATIA LIQUEFACIENS   Final   Report Status 05/15/2012 FINAL   Final    Organism ID, Bacteria SERRATIA LIQUEFACIENS   Final  CULTURE, BLOOD (ROUTINE X 2)     Status: None   Collection Time    05/13/12  6:50 PM      Result Value Range Status   Specimen Description BLOOD RIGHT ARM   Final   Special Requests BOTTLES DRAWN AEROBIC AND ANAEROBIC 2CC   Final   Culture  Setup Time 05/13/2012 23:52   Final   Culture GRAM POSITIVE COCCI IN CLUSTERS   Final   Report Status PENDING   Incomplete  MRSA PCR SCREENING     Status: Abnormal   Collection Time    05/14/12 12:41 AM      Result Value Range Status   MRSA by PCR POSITIVE (*) NEGATIVE Final   Comment:            The GeneXpert MRSA Assay (FDA     approved for NASAL specimens     only), is one component of a     comprehensive MRSA colonization     surveillance program. It is not     intended to  diagnose MRSA     infection nor to guide or     monitor treatment for     MRSA infections.     RESULT CALLED TO, READ BACK BY AND VERIFIED WITH:     L. HARRIS RN AT 0310 ON 05.13.14 BY SHUEA    Studies/Results: Ct Head Wo Contrast  05/14/2012   *RADIOLOGY REPORT*  Clinical Data: Dizzy with confusion.  History of bladder cancer.  CT HEAD WITHOUT CONTRAST  Technique:  Contiguous axial images were obtained from the base of the skull through the vertex without contrast.  Comparison: None.  Findings: There is no evidence for acute infarction, intracranial hemorrhage, mass lesion, or hydrocephalus.  Slight posterior fossa hygroma on the left of doubtful significance. Mild atrophy.  Mild chronic microvascular ischemic change.  Calvarium intact.  No sinus disease.  Chronic right mastoiditis.  IMPRESSION: No acute intracranial findings.  Slight left posterior fossa extra- axial collection, non compressive, consistent with incidental hygroma.    No intracranial mass lesion to suggest metastatic disease.   Original Report Authenticated By: Davonna Belling, M.D.   Ct Pelvis Wo Contrast  05/15/2012   *RADIOLOGY REPORT*  Clinical Data:  Verify location of the distal left ureteral stent.  CT PELVIS WITHOUT CONTRAST  Technique:  Multidetector CT imaging of the pelvis was performed following the standard protocol without intravenous contrast.  Comparison: Loopogram 05/15/2012 and CT abdomen pelvis 05/13/2012.  Findings: There is residual contrast in the right lower quadrant ileal conduit from loopogram performed just prior to the CT.  A left ureteral stent is in place.  Proximal loop is not imaged.  The distal loop is located in the distal left ureter, rather than the ileal conduit (image 19).  The visualized portion of the right ureter shows no hydronephrosis.  There is no contrast within the right ureter from previous loopogram performed just prior to the CT.  Retained contrast is seen in the colon.  Visualized bowel is otherwise unremarkable.  Postoperative changes of cystoprostatectomy.  No free fluid.  No pathologically enlarged lymph nodes.  No worrisome lytic or sclerotic lesions.  IMPRESSION:  1.  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  Distal loop of the left ureteral stent is seen in the distal left ureter, rather than the ileal conduit. 2.  Right hydronephrosis.  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   Original Report Authenticated By: Leanna Battles, M.D.   Dg Loopogram  05/15/2012   *RADIOLOGY REPORT*  Clinical Data: Right hydronephrosis.  Evaluate for ureteral leak.  LOOPOGRAM  Comparison: CT abdomen pelvis 05/13/2012.  Findings: Scout view of the abdomen shows a left ureteral stent with the distal loop projecting over the right lower quadrant.  A percutaneous nephrostomy is in place on the left.  There are extensive postoperative changes in the right abdomen.  Retained contrast is seen in the distal colon.  A 12-French Foley catheter was inserted into the right lower quadrant ileal  conduit and balloon inflated.  Approximately 50-60 ml of Cystografin was hand injected.  There is opacification of the ileal conduit with reflux of contrast into the distal left ureter. The distal loop of the left ureteral stent partially projects over the ileal conduit, but not completely.  There is no reflux of contrast into the right ureter.  IMPRESSION:  1.  Distal loop of the left ureteral stent may be within the  distal left ureter. 2.  Right ureter did not opacify, which can be seen with a distal ureteral stricture. 3. These results were called by telephone on 05/15/2012 at 1215 hours to Dr. Berneice Heinrich, who verbally acknowledged these results. The patient will undergo with CT pelvis without contrast to confirm left ureteral stent position.   Original Report Authenticated By: Leanna Battles, M.D.    Assessment/Plan: 1)  Staph aureus bacteremia - patient overall better.  Repeat blood cultures sent yesterday.  TTE not a good study.   -He has had + MRSA in urine with urostomy tube in place,  ? Possible to remove tube with concern for colonization -If tube removed, duration of antibiotics may be shorter, though would need a TEE -can d/c either cefazolin or vancomycin depending on sensitivities of Staph aureus  2) urine culture - seems to be asymptomatic.  Serratia.  Started Bactrim.  Treat for 5 days total.     Staci Righter, MD [facility] [phone] pager   05/16/2012, 8:42 AM

## 2012-05-16 NOTE — Progress Notes (Signed)
[facility] PROGRESS NOTE  Chad Olson ZOX:[id number] DOB: [birth year] DOA: 05/13/2012 PCP: [name], MD  Primary Urologist: Dr. Sebastian Ache  Brief narrative 67 year old male patient with history of bladder cancer, status post cystectomy with ileal conduit urinary diversion for refractory recurrent high-grade bladder cancer, urinoma/left ureteral leak/right hydronephrosis-underwent multiple urologic procedures-has left nephrostomy, recent incidental PE on full dose Lovenox, HTN, HL, diet-controlled DM 2 was admitted on 05/13/12 with complaints of fever, nausea, vomiting and malaise. In ED, repeat CT showed worsening right-sided hydronephrosis. Hospitalist admission requested.  Assessment/Plan: 1. Recurrent UTI/pyelonephritis (Serratia Liquefaciens): Resistant to cefazolin. ID has changed to oral Bactrim to complete total 5 days. 2. Staphylococcus Bacteremia (2/2  Blood cultures 5/12: gram-positive cocci in clusters)-? Source-urinary source. Continue IV vancomycin & Cefazolin pending final culture sensitivity results. ID input appreciated-recommend TTE-poor study, possible removal of left nephrostomy tube and ureteral stents and repeat surveillance blood cultures x2 on 5/14 (pending). As per urologist, bilateral ureteral anastomosis appear compromised and patient will need open revision of anastomosis which he plans in electively in a few weeks. Discussed with Dr. Luciana Axe, defer TEE at this time and plan to treat with IV antibiotics for total of 6 weeks. If surveillance blood cultures are negative tomorrow, we'll plan PICC line and possible DC home in a.m. 3. Bladder cancer/worsening right hydronephrosis: Complicated urology history with multiple procedures. Management per urology. See above. 4. VTE: Discussed with urology and resumed full dose Lovenox. Lovenox can be discontinued 12 hours before elective urological procedure. 5. Hypokalemia: Replaced. 6. Nausea and vomiting:secondary to  acute illness-UTI, bacteremia and hydronephrosis. No further episodes and tolerating diet well. 7. Altered mental status: Unclear etiology. No focal deficits. Follow CT head-no acute findings.? Related to infections. Per family-seems to have resolved. 8. HTN: Controlled. 9. DM 2: Check CBGs and SSI. Controlled inpatient 10. Incidental left posterior hygroma on CT head, without mass effect : OP follow up.  Code Status: Full Family Communication: Discussed with patient's sister at bedside Disposition Plan: Remains inpatient. Possible discharge on 5/16.   Consultants:  Urology  Infectious disease  Procedures:  None  Antibiotics:  IV Rocephin 5/12 > 5/13  IV vancomycin 5/12 >   IV cefazolin 5/13 >  By mouth Bactrim 5/15 >  HPI/Subjective:  Denies complaints. Tolerating diet well.  Objective: Filed Vitals:   05/15/12 0522 05/15/12 1338 05/15/12 2212 05/16/12 0702  BP: 116/74 122/77 113/75 125/78  Pulse: 78 78 83 82  Temp: 98.5 F (36.9 C) 98.7 F (37.1 C) 98.1 F (36.7 C) 98.1 F (36.7 C)  TempSrc: Oral Oral Oral Oral  Resp: 17  18 18   Height:      Weight:      SpO2: 96% 97% 97% 96%    Intake/Output Summary (Last 24 hours) at 05/16/12 1147 Last data filed at 05/16/12 0600  Gross per 24 hour  Intake   1110 ml  Output   3500 ml  Net  -2390 ml   Filed Weights   05/13/12 2100 05/13/12 2319 05/14/12 0900  Weight: 106.7 kg (235 lb 3.7 oz) 98.521 kg (217 lb 3.2 oz) 98.158 kg (216 lb 6.4 oz)    Exam:   General exam: Comfortable.  Respiratory system: Clear. No increased work of breathing.  Cardiovascular system: S1 & S2 heard, RRR. No JVD, murmurs, gallops, clicks or pedal edema. Telemetry: sinus rhythm mostly.   Gastrointestinal system: Abdomen is nondistended, soft and nontender. Normal bowel sounds heard. Right-sided ileal conduit intact. Left nephrostomy intact.  Central  nervous system: Alert and oriented. No focal neurological deficits.  Extremities:  Symmetric 5 x 5 power.   Data Reviewed: Basic Metabolic Panel:  Recent Labs Lab 05/13/12 1650 05/13/12 1700 05/14/12 0433 05/15/12 0410  NA 134* 136 142 141  K 3.1* 3.1* 3.2* 3.6  CL 97 99 106 105  CO2 26  --  28 27  GLUCOSE 143* 148* 121* 126*  BUN 12 12 9 7   CREATININE 1.05 1.00 0.92 0.94  CALCIUM 8.8  --  8.7 8.5   Liver Function Tests: No results found for this basename: AST, ALT, ALKPHOS, BILITOT, PROT, ALBUMIN,  in the last 168 hours  Recent Labs Lab 05/13/12 1650  LIPASE 25   No results found for this basename: AMMONIA,  in the last 168 hours CBC:  Recent Labs Lab 05/13/12 1650 05/13/12 1700 05/14/12 0433 05/15/12 0410  WBC 7.4  --  6.7 6.0  HGB 13.4 14.3 12.8* 12.4*  HCT 40.3 42.0 38.6* 38.1*  MCV 89.6  --  90.6 91.4  PLT 363  --  354 350   Cardiac Enzymes: No results found for this basename: CKTOTAL, CKMB, CKMBINDEX, TROPONINI,  in the last 168 hours BNP (last 3 results) No results found for this basename: PROBNP,  in the last 8760 hours CBG:  Recent Labs Lab 05/15/12 1336 05/15/12 1715 05/15/12 2215 05/16/12 0728 05/16/12 1130  GLUCAP 144* 148* 185* 116* 109*    Recent Results (from the past 240 hour(s))  CULTURE, BLOOD (ROUTINE X 2)     Status: None   Collection Time    05/13/12  6:40 PM      Result Value Range Status   Specimen Description BLOOD LEFT ARM   Final   Special Requests BOTTLES DRAWN AEROBIC AND ANAEROBIC 4CC   Final   Culture  Setup Time 05/13/2012 23:52   Final   Culture     Final   Value: STAPHYLOCOCCUS AUREUS     Note: RIFAMPIN AND GENTAMICIN SHOULD NOT BE USED AS SINGLE DRUGS FOR TREATMENT OF STAPH INFECTIONS.     Note: Gram Stain Report Called to,Read Back By and Verified With: GRACE SISON 05/14/12 1525 BY SMITHERSJ   Report Status PENDING   Incomplete  URINE CULTURE     Status: None   Collection Time    05/13/12  6:42 PM      Result Value Range Status   Specimen Description URINE, SUPRAPUBIC   Final   Special  Requests NONE   Final   Culture  Setup Time 05/14/2012 02:09   Final   Colony Count >=100,000 COLONIES/ML   Final   Culture SERRATIA LIQUEFACIENS   Final   Report Status 05/15/2012 FINAL   Final   Organism ID, Bacteria SERRATIA LIQUEFACIENS   Final  CULTURE, BLOOD (ROUTINE X 2)     Status: None   Collection Time    05/13/12  6:50 PM      Result Value Range Status   Specimen Description BLOOD RIGHT ARM   Final   Special Requests BOTTLES DRAWN AEROBIC AND ANAEROBIC 2CC   Final   Culture  Setup Time 05/13/2012 23:52   Final   Culture GRAM POSITIVE COCCI IN CLUSTERS   Final   Report Status PENDING   Incomplete  MRSA PCR SCREENING     Status: Abnormal   Collection Time    05/14/12 12:41 AM      Result Value Range Status   MRSA by PCR POSITIVE (*) NEGATIVE Final  Comment:            The GeneXpert MRSA Assay (FDA     approved for NASAL specimens     only), is one component of a     comprehensive MRSA colonization     surveillance program. It is not     intended to diagnose MRSA     infection nor to guide or     monitor treatment for     MRSA infections.     RESULT CALLED TO, READ BACK BY AND VERIFIED WITH:     L. HARRIS RN AT 0310 ON 05.13.14 BY SHUEA     Studies: Ct Head Wo Contrast  05/14/2012   *RADIOLOGY REPORT*  Clinical Data: Dizzy with confusion.  History of bladder cancer.  CT HEAD WITHOUT CONTRAST  Technique:  Contiguous axial images were obtained from the base of the skull through the vertex without contrast.  Comparison: None.  Findings: There is no evidence for acute infarction, intracranial hemorrhage, mass lesion, or hydrocephalus.  Slight posterior fossa hygroma on the left of doubtful significance. Mild atrophy.  Mild chronic microvascular ischemic change.  Calvarium intact.  No sinus disease.  Chronic right mastoiditis.  IMPRESSION: No acute intracranial findings.  Slight left posterior fossa extra- axial collection, non compressive, consistent with incidental hygroma.     No intracranial mass lesion to suggest metastatic disease.   Original Report Authenticated By: Davonna Belling, M.D.   Ct Pelvis Wo Contrast  05/15/2012   *RADIOLOGY REPORT*  Clinical Data: Verify location of the distal left ureteral stent.  CT PELVIS WITHOUT CONTRAST  Technique:  Multidetector CT imaging of the pelvis was performed following the standard protocol without intravenous contrast.  Comparison: Loopogram 05/15/2012 and CT abdomen pelvis 05/13/2012.  Findings: There is residual contrast in the right lower quadrant ileal conduit from loopogram performed just prior to the CT.  A left ureteral stent is in place.  Proximal loop is not imaged.  The distal loop is located in the distal left ureter, rather than the ileal conduit (image 19).  The visualized portion of the right ureter shows no hydronephrosis.  There is no contrast within the right ureter from previous loopogram performed just prior to the CT.  Retained contrast is seen in the colon.  Visualized bowel is otherwise unremarkable.  Postoperative changes of cystoprostatectomy.  No free fluid.  No pathologically enlarged lymph nodes.  No worrisome lytic or sclerotic lesions.  IMPRESSION:  1.  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  Distal loop of the left ureteral stent is seen in the distal left ureter, rather than the ileal conduit. 2.  Right hydronephrosis.  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   Original Report Authenticated By: Leanna Battles, M.D.   Dg Loopogram  05/15/2012   *RADIOLOGY REPORT*  Clinical Data: Right hydronephrosis.  Evaluate for ureteral leak.  LOOPOGRAM  Comparison: CT abdomen pelvis 05/13/2012.  Findings: Scout view of the abdomen shows a left ureteral stent with the distal loop projecting over the right lower quadrant.  A percutaneous nephrostomy is in place on the left.   There are extensive postoperative changes in the right abdomen.  Retained contrast is seen in the distal colon.  A 12-French Foley catheter was inserted into the right lower quadrant ileal conduit and balloon inflated.  Approximately 50-60 ml of Cystografin was hand injected.  There is  opacification of the ileal conduit with reflux of contrast into the distal left ureter. The distal loop of the left ureteral stent partially projects over the ileal conduit, but not completely.  There is no reflux of contrast into the right ureter.  IMPRESSION:  1.  Distal loop of the left ureteral stent may be within the distal left ureter. 2.  Right ureter did not opacify, which can be seen with a distal ureteral stricture. 3. These results were called by telephone on 05/15/2012 at 1215 hours to Dr. Berneice Heinrich, who verbally acknowledged these results. The patient will undergo with CT pelvis without contrast to confirm left ureteral stent position.   Original Report Authenticated By: Leanna Battles, M.D.     Additional labs:   Scheduled Meds: .  ceFAZolin (ANCEF) IV  2 g Intravenous Q8H  . Chlorhexidine Gluconate Cloth  6 each Topical Q0600  . enoxaparin (LOVENOX) injection  1 mg/kg Subcutaneous Q12H  . feeding supplement  237 mL Oral BID BM  . insulin aspart  0-9 Units Subcutaneous TID WC  . mirtazapine  15 mg Oral Daily  . multivitamin with minerals  1 tablet Oral Daily  . mupirocin ointment  1 application Nasal BID  . pantoprazole  40 mg Oral Daily  . sodium chloride  3 mL Intravenous Q12H  . sulfamethoxazole-trimethoprim  1 tablet Oral Q12H  . vancomycin  1,000 mg Intravenous Q12H   Continuous Infusions:    Principal Problem:   UTI (lower urinary tract infection) Active Problems:   Bladder cancer   Acute pulmonary embolism (3/14)   Nephrostomy status   Bilateral hydronephrosis   DVT (deep venous thrombosis) 3/14   Fever   Bacteremia    Time spent: 25 minutes.    [facility]  [facility] Pager [phone].   If 8PM-8AM, please contact night-coverage at [email or web address], password [facility] 05/16/2012, 11:47 AM  LOS: 3 days

## 2012-05-17 LAB — GLUCOSE, CAPILLARY: Glucose-Capillary: 126 mg/dL — ABNORMAL HIGH (ref 70–99)

## 2012-05-17 LAB — VANCOMYCIN, TROUGH: Vancomycin Tr: 12.6 ug/mL (ref 10.0–20.0)

## 2012-05-17 MED ORDER — GLUCERNA SHAKE PO LIQD: 237.0000 mL | Freq: Two times a day (BID) | ORAL | Status: DC

## 2012-05-17 MED ORDER — ENOXAPARIN SODIUM 100 MG/ML ~~LOC~~ SOLN
100.0000 mg | Freq: Two times a day (BID) | SUBCUTANEOUS | Status: DC
  Filled 2012-05-17: qty 1

## 2012-05-17 MED ORDER — ENOXAPARIN SODIUM 120 MG/0.8ML ~~LOC~~ SOLN
100.0000 mg | Freq: Once | SUBCUTANEOUS | Status: DC
  Filled 2012-05-17: qty 0.8

## 2012-05-17 MED ORDER — VANCOMYCIN HCL 10 G IV SOLR: 1250.0000 mg | Freq: Two times a day (BID) | INTRAVENOUS | Status: DC

## 2012-05-17 MED ORDER — ENOXAPARIN SODIUM 100 MG/ML ~~LOC~~ SOLN
1.0000 mg/kg | Freq: Two times a day (BID) | SUBCUTANEOUS | Status: DC
  Filled 2012-05-17: qty 1

## 2012-05-17 MED ORDER — ENOXAPARIN SODIUM 120 MG/0.8ML ~~LOC~~ SOLN: 100.0000 mg | Freq: Two times a day (BID) | SUBCUTANEOUS | Status: DC

## 2012-05-17 MED ORDER — SODIUM CHLORIDE 0.9 % IJ SOLN
10.0000 mL | INTRAMUSCULAR | Status: DC | PRN
  Administered 2012-05-17: 10 mL

## 2012-05-17 MED ORDER — ENOXAPARIN SODIUM 100 MG/ML ~~LOC~~ SOLN
100.0000 mg | Freq: Once | SUBCUTANEOUS | Status: DC
  Filled 2012-05-17: qty 1

## 2012-05-17 MED ORDER — ENOXAPARIN SODIUM 100 MG/ML ~~LOC~~ SOLN: 1.0000 mg/kg | Freq: Two times a day (BID) | SUBCUTANEOUS | Status: DC

## 2012-05-17 MED ORDER — ENOXAPARIN SODIUM 120 MG/0.8ML ~~LOC~~ SOLN
100.0000 mg | Freq: Two times a day (BID) | SUBCUTANEOUS | Status: DC
  Administered 2012-05-17: 100 mg via SUBCUTANEOUS
  Filled 2012-05-17 (×2): qty 0.8

## 2012-05-17 MED ORDER — ENOXAPARIN SODIUM 120 MG/0.8ML ~~LOC~~ SOLN
1.0000 mg/kg | Freq: Two times a day (BID) | SUBCUTANEOUS | Status: DC
  Filled 2012-05-17: qty 0.8

## 2012-05-17 MED ORDER — SULFAMETHOXAZOLE-TMP DS 800-160 MG PO TABS: 1.0000 | ORAL_TABLET | Freq: Two times a day (BID) | ORAL | Status: DC

## 2012-05-17 MED ORDER — ADULT MULTIVITAMIN W/MINERALS CH: 1.0000 | ORAL_TABLET | Freq: Every day | ORAL | Status: DC

## 2012-05-17 MED ORDER — VANCOMYCIN HCL 10 G IV SOLR
1250.0000 mg | Freq: Two times a day (BID) | INTRAVENOUS | Status: DC
  Administered 2012-05-17: 1250 mg via INTRAVENOUS
  Filled 2012-05-17 (×2): qty 1250

## 2012-05-17 NOTE — Progress Notes (Signed)
ANTIBIOTIC CONSULT NOTE - FOLLOW UP  Pharmacy Consult for vancomycin Indication: MRSA bacteremia  No Known Allergies  Patient Measurements: Height: 5\' 11"  (180.3 cm) Weight: 216 lb 6.4 oz (98.158 kg) IBW/kg (Calculated) : 75.3 Adjusted Body Weight:   Vital Signs: Temp: 98.2 F (36.8 C) (05/15 2200) Temp src: Oral (05/15 2200) BP: 125/77 mmHg (05/15 2200) Pulse Rate: 84 (05/15 2200) Intake/Output from previous day: 05/15 0701 - 05/16 0700 In: 1200 [P.O.:1200] Out: 1900 [Urine:1900] Intake/Output from this shift:    Labs:  Recent Labs  05/14/12 0433 05/15/12 0410  WBC 6.7 6.0  HGB 12.8* 12.4*  PLT 354 350  CREATININE 0.92 0.94   Estimated Creatinine Clearance: 92.4 ml/min (by C-G formula based on Cr of 0.94).  Recent Labs  05/16/12 2330  VANCOTROUGH 12.6     Microbiology: Recent Results (from the past 720 hour(s))  CULTURE, BLOOD (ROUTINE X 2)     Status: None   Collection Time    05/13/12  6:40 PM      Result Value Range Status   Specimen Description BLOOD LEFT ARM   Final   Special Requests BOTTLES DRAWN AEROBIC AND ANAEROBIC 4CC   Final   Culture  Setup Time 05/13/2012 23:52   Final   Culture     Final   Value: METHICILLIN RESISTANT STAPHYLOCOCCUS AUREUS     Note: RIFAMPIN AND GENTAMICIN SHOULD NOT BE USED AS SINGLE DRUGS FOR TREATMENT OF STAPH INFECTIONS. CRITICAL RESULT CALLED TO, READ BACK BY AND VERIFIED WITH: VASHONDA F @ [phone] 05/16/12 BY KRAWS     Note: Gram Stain Report Called to,Read Back By and Verified With: GRACE SISON 05/14/12 1525 BY SMITHERSJ   Report Status 05/16/2012 FINAL   Final   Organism ID, Bacteria METHICILLIN RESISTANT STAPHYLOCOCCUS AUREUS   Final  URINE CULTURE     Status: None   Collection Time    05/13/12  6:42 PM      Result Value Range Status   Specimen Description URINE, SUPRAPUBIC   Final   Special Requests NONE   Final   Culture  Setup Time 05/14/2012 02:09   Final   Colony Count >=100,000 COLONIES/ML   Final   Culture  SERRATIA LIQUEFACIENS   Final   Report Status 05/15/2012 FINAL   Final   Organism ID, Bacteria SERRATIA LIQUEFACIENS   Final  CULTURE, BLOOD (ROUTINE X 2)     Status: None   Collection Time    05/13/12  6:50 PM      Result Value Range Status   Specimen Description BLOOD RIGHT ARM   Final   Special Requests BOTTLES DRAWN AEROBIC AND ANAEROBIC 2CC   Final   Culture  Setup Time 05/13/2012 23:52   Final   Culture     Final   Value: STAPHYLOCOCCUS AUREUS     Note: SUSCEPTIBILITIES PERFORMED ON PREVIOUS CULTURE WITHIN THE LAST 5 DAYS.     Note: Gram Stain Report Called to,Read Back By and Verified With: MONICA CLAYTON 05/15/12 AT 0210 RIDK   Report Status 05/16/2012 FINAL   Final  MRSA PCR SCREENING     Status: Abnormal   Collection Time    05/14/12 12:41 AM      Result Value Range Status   MRSA by PCR POSITIVE (*) NEGATIVE Final   Comment:            The GeneXpert MRSA Assay (FDA     approved for NASAL specimens     only), is one component  of a     comprehensive MRSA colonization     surveillance program. It is not     intended to diagnose MRSA     infection nor to guide or     monitor treatment for     MRSA infections.     RESULT CALLED TO, READ BACK BY AND VERIFIED WITH:     L. HARRIS RN AT 0310 ON 05.13.14 BY SHUEA  CULTURE, BLOOD (ROUTINE X 2)     Status: None   Collection Time    05/15/12  1:57 PM      Result Value Range Status   Specimen Description BLOOD LEFT HAND   Final   Special Requests BOTTLES DRAWN AEROBIC ONLY 3CC   Final   Culture  Setup Time 05/15/2012 17:05   Final   Culture     Final   Value:        BLOOD CULTURE RECEIVED NO GROWTH TO DATE CULTURE WILL BE HELD FOR 5 DAYS BEFORE ISSUING A FINAL NEGATIVE REPORT   Report Status PENDING   Incomplete  CULTURE, BLOOD (ROUTINE X 2)     Status: None   Collection Time    05/15/12  2:05 PM      Result Value Range Status   Specimen Description BLOOD RIGHT ARM   Final   Special Requests BOTTLES DRAWN AEROBIC AND  ANAEROBIC 5CC   Final   Culture  Setup Time 05/15/2012 17:05   Final   Culture     Final   Value:        BLOOD CULTURE RECEIVED NO GROWTH TO DATE CULTURE WILL BE HELD FOR 5 DAYS BEFORE ISSUING A FINAL NEGATIVE REPORT   Report Status PENDING   Incomplete    Anti-infectives   Start     Dose/Rate Route Frequency Ordered Stop   05/17/12 0800  vancomycin (VANCOCIN) 1,250 mg in sodium chloride 0.9 % 250 mL IVPB     1,250 mg 166.7 mL/hr over 90 Minutes Intravenous Every 12 hours 05/17/12 0225     05/16/12 0800  sulfamethoxazole-trimethoprim (BACTRIM DS) 800-160 MG per tablet 1 tablet     1 tablet Oral Every 12 hours 05/16/12 0721     05/14/12 1800  ceFAZolin (ANCEF) IVPB 2 g/50 mL premix  Status:  Discontinued     2 g 100 mL/hr over 30 Minutes Intravenous Every 8 hours 05/14/12 1732 05/16/12 1438   05/14/12 1000  vancomycin (VANCOCIN) IVPB 1000 mg/200 mL premix  Status:  Discontinued     1,000 mg 200 mL/hr over 60 Minutes Intravenous Every 12 hours 05/13/12 2137 05/17/12 0225   05/13/12 2200  vancomycin (VANCOCIN) 2,000 mg in sodium chloride 0.9 % 500 mL IVPB     2,000 mg 250 mL/hr over 120 Minutes Intravenous  Once 05/13/12 2135 05/14/12 0004   05/13/12 2145  cefTRIAXone (ROCEPHIN) 1 g in dextrose 5 % 50 mL IVPB  Status:  Discontinued     1 g 100 mL/hr over 30 Minutes Intravenous Every 24 hours 05/13/12 2140 05/14/12 1658      Assessment: Patient with low vancomycin level.  Goal of Therapy:  Vancomycin trough level 15-20 mcg/ml  Plan:  Measure antibiotic drug levels at steady state Follow up culture results Change to vancomycin 1250mg  iv q12hr  Darlina Guys, Jacquenette Shone Crowford 05/17/2012,2:35 AM

## 2012-05-17 NOTE — Progress Notes (Signed)
Subjective:  1 - Bladder Caner - Pt s/p cystectomy with ileal conduit urinary diversion 02/2012 for refracotry recurrent high-grade bladder cancer. Final stage [id number].   2 - UTI / Maliase - Pt currently admitted with fevers (102 on admit) and bacteruria worrisome for pyelonephrisits. Fevers now resolved on emperic Rocephin + Vanc. Blood CX's positive and ID consultation assisting with management of MRSA bacterima and gram negative UTI. Current plan for 6 weeks IV ABX.  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 Recent CT <1. He did however have left ureteral-anastamotic leak necessitating antegrade JJ stent by IR 03/2012. Subsequent left sided antegrade nephrostogram revealed no residual leak, but distal end of stent unable to be retrieved via office endoscopy raising concern again about the location and integrity of left ureteral anastomosis. Repeat studies 5/14 with CT loopogram again concerning as Rt side did freely reflux (worriseom for partial obstruction) and left ureteral stent distal end not in conduit.  Pt with recent incidental PE now on Lovenox.   Today Chad Olson preparing for discharge home. He has PICC in place and arrangements for [facility]X.  Objective: Vital signs in last 24 hours: Temp:  [97.6 F (36.4 C)-98.2 F (36.8 C)] 97.6 F (36.4 C) (05/16 1542) Pulse Rate:  [72-84] 72 (05/16 1542) Resp:  [18-20] 18 (05/16 1542) BP: (124-128)/(72-77) 128/72 mmHg (05/16 1542) SpO2:  [96 %-99 %] 99 % (05/16 1542) Last BM Date: 05/16/12  Intake/Output from previous day: 05/15 0701 - 05/16 0700 In: 1200 [P.O.:1200] Out: 4100 [Urine:4100] Intake/Output this shift: Total I/O In: 240 [P.O.:240] Out: 1700 [Urine:1700]  General appearance: alert, cooperative, appears stated age and Family at bedside Head: Normocephalic, without obvious abnormality,  atraumatic Eyes: conjunctivae/corneas clear. PERRL, EOM's intact. Fundi benign. Ears: normal TM's and external ear canals both ears Nose: Nares normal. Septum midline. Mucosa normal. No drainage or sinus tenderness. Throat: lips, mucosa, and tongue normal; teeth and gums normal Neck: no adenopathy, no carotid bruit, no JVD, supple, symmetrical, trachea midline and thyroid not enlarged, symmetric, no tenderness/mass/nodules Back: symmetric, no curvature. ROM normal. No CVA tenderness., left neph tube c/d/i, capped Resp: clear to auscultation bilaterally Chest wall: no tenderness Cardio: regular rate and rhythm, S1, S2 normal, no murmur, click, rub or gallop GI: soft, non-tender; bowel sounds normal; no masses,  no organomegaly and RLQ Urostomy pink and patent Male genitalia: normal Extremities: extremities normal, atraumatic, no cyanosis or edema Pulses: 2+ and symmetric Skin: Skin color, texture, turgor normal. No rashes or lesions Lymph nodes: Cervical, supraclavicular, and axillary nodes normal. Neurologic: Alert and oriented X 3, normal strength and tone. Normal symmetric reflexes. Normal coordination and gait  Lab Results:   Recent Labs  05/15/12 0410  WBC 6.0  HGB 12.4*  HCT 38.1*  PLT 350   BMET  Recent Labs  05/15/12 0410  NA 141  K 3.6  CL 105  CO2 27  GLUCOSE 126*  BUN 7  CREATININE 0.94  CALCIUM 8.5   PT/INR No results found for this basename: LABPROT, INR,  in the last 72 hours ABG No results found for this basename: PHART, PCO2, PO2, HCO3,  in the last 72 hours  Studies/Results: No results found.  Anti-infectives: Anti-infectives   Start     Dose/Rate Route Frequency Ordered Stop   05/17/12 0800  vancomycin (VANCOCIN) 1,250 mg in sodium chloride 0.9 % 250 mL IVPB  1,250 mg 166.7 mL/hr over 90 Minutes Intravenous Every 12 hours 05/17/12 0225     05/16/12 0800  sulfamethoxazole-trimethoprim (BACTRIM DS) 800-160 MG per tablet 1 tablet     1 tablet  Oral Every 12 hours 05/16/12 0721     05/14/12 1800  ceFAZolin (ANCEF) IVPB 2 g/50 mL premix  Status:  Discontinued     2 g 100 mL/hr over 30 Minutes Intravenous Every 8 hours 05/14/12 1732 05/16/12 1438   05/14/12 1000  vancomycin (VANCOCIN) IVPB 1000 mg/200 mL premix  Status:  Discontinued     1,000 mg 200 mL/hr over 60 Minutes Intravenous Every 12 hours 05/13/12 2137 05/17/12 0225   05/13/12 2200  vancomycin (VANCOCIN) 2,000 mg in sodium chloride 0.9 % 500 mL IVPB     2,000 mg 250 mL/hr over 120 Minutes Intravenous  Once 05/13/12 2135 05/14/12 0004   05/13/12 2145  cefTRIAXone (ROCEPHIN) 1 g in dextrose 5 % 50 mL IVPB  Status:  Discontinued     1 g 100 mL/hr over 30 Minutes Intravenous Every 24 hours 05/13/12 2140 05/14/12 1658      Assessment/Plan:   1 - Bladder Caner - No worrisome findings for recurrent disease by recent imaging.  2 - UTI / Maliase - Greatly appreciate ID and hospitalist help. Pt subjectively much improved on current therapy.  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 Fortunately pt now afebrile and renal function in normal range. I re-adressed that I feel he will need open revision of anastomoses once infectious parameters resolved and he can be off anticoagulation x 2 weeks. This will likely be in elective setting.   I discussed situation with hospitalist team and we feel that remaining on ABX and anticoagulation for few more weeks and then elective repair is most reasonable balance or infectious, coagulation, and urologic risks.    [facility], Artavius Stearns 05/17/2012

## 2012-05-17 NOTE — Progress Notes (Signed)
[facility]  Date of Admission:  05/13/2012  Antibiotics: Vancomycin day 5 Cefazolin 3 dys Bactrim day 2  Subjective: No complaints  Objective: Temp:  [97.9 F (36.6 C)-98.2 F (36.8 C)] 97.9 F (36.6 C) (05/16 0643) Pulse Rate:  [72-84] 72 (05/16 0643) Resp:  [18-20] 20 (05/16 0643) BP: (124-125)/(76-77) 124/76 mmHg (05/16 0643) SpO2:  [96 %-98 %] 98 % (05/16 0643)  Gen: AAO x 3, nad CV: rrr no m/r/g  Lab Results Lab Results  Component Value Date   WBC 6.0 05/15/2012   HGB 12.4* 05/15/2012   HCT 38.1* 05/15/2012   MCV 91.4 05/15/2012   PLT 350 05/15/2012    Lab Results  Component Value Date   CREATININE 0.94 05/15/2012   BUN 7 05/15/2012   NA 141 05/15/2012   K 3.6 05/15/2012   CL 105 05/15/2012   CO2 27 05/15/2012    Lab Results  Component Value Date   ALT 22 04/18/2012   AST 16 04/18/2012   ALKPHOS 82 04/18/2012   BILITOT 0.46 04/18/2012      Microbiology: Recent Results (from the past 240 hour(s))  CULTURE, BLOOD (ROUTINE X 2)     Status: None   Collection Time    05/13/12  6:40 PM      Result Value Range Status   Specimen Description BLOOD LEFT ARM   Final   Special Requests BOTTLES DRAWN AEROBIC AND ANAEROBIC 4CC   Final   Culture  Setup Time 05/13/2012 23:52   Final   Culture     Final   Value: METHICILLIN RESISTANT STAPHYLOCOCCUS AUREUS     Note: RIFAMPIN AND GENTAMICIN SHOULD NOT BE USED AS SINGLE DRUGS FOR TREATMENT OF STAPH INFECTIONS. CRITICAL RESULT CALLED TO, READ BACK BY AND VERIFIED WITH: VASHONDA F @ [phone] 05/16/12 BY KRAWS     Note: Gram Stain Report Called to,Read Back By and Verified With: GRACE SISON 05/14/12 1525 BY SMITHERSJ   Report Status 05/16/2012 FINAL   Final   Organism ID, Bacteria METHICILLIN RESISTANT STAPHYLOCOCCUS AUREUS   Final  URINE CULTURE     Status: None   Collection Time    05/13/12  6:42 PM      Result Value Range Status   Specimen Description URINE, SUPRAPUBIC   Final   Special Requests NONE    Final   Culture  Setup Time 05/14/2012 02:09   Final   Colony Count >=100,000 COLONIES/ML   Final   Culture SERRATIA LIQUEFACIENS   Final   Report Status 05/15/2012 FINAL   Final   Organism ID, Bacteria SERRATIA LIQUEFACIENS   Final  CULTURE, BLOOD (ROUTINE X 2)     Status: None   Collection Time    05/13/12  6:50 PM      Result Value Range Status   Specimen Description BLOOD RIGHT ARM   Final   Special Requests BOTTLES DRAWN AEROBIC AND ANAEROBIC 2CC   Final   Culture  Setup Time 05/13/2012 23:52   Final   Culture     Final   Value: STAPHYLOCOCCUS AUREUS     Note: SUSCEPTIBILITIES PERFORMED ON PREVIOUS CULTURE WITHIN THE LAST 5 DAYS.     Note: Gram Stain Report Called to,Read Back By and Verified With: MONICA CLAYTON 05/15/12 AT 0210 RIDK   Report Status 05/16/2012 FINAL   Final  MRSA PCR SCREENING     Status: Abnormal   Collection Time    05/14/12 12:41 AM  Result Value Range Status   MRSA by PCR POSITIVE (*) NEGATIVE Final   Comment:            The GeneXpert MRSA Assay (FDA     approved for NASAL specimens     only), is one component of a     comprehensive MRSA colonization     surveillance program. It is not     intended to diagnose MRSA     infection nor to guide or     monitor treatment for     MRSA infections.     RESULT CALLED TO, READ BACK BY AND VERIFIED WITH:     L. HARRIS RN AT 0310 ON 05.13.14 BY SHUEA  CULTURE, BLOOD (ROUTINE X 2)     Status: None   Collection Time    05/15/12  1:57 PM      Result Value Range Status   Specimen Description BLOOD LEFT HAND   Final   Special Requests BOTTLES DRAWN AEROBIC ONLY 3CC   Final   Culture  Setup Time 05/15/2012 17:05   Final   Culture     Final   Value:        BLOOD CULTURE RECEIVED NO GROWTH TO DATE CULTURE WILL BE HELD FOR 5 DAYS BEFORE ISSUING A FINAL NEGATIVE REPORT   Report Status PENDING   Incomplete  CULTURE, BLOOD (ROUTINE X 2)     Status: None   Collection Time    05/15/12  2:05 PM      Result Value  Range Status   Specimen Description BLOOD RIGHT ARM   Final   Special Requests BOTTLES DRAWN AEROBIC AND ANAEROBIC 5CC   Final   Culture  Setup Time 05/15/2012 17:05   Final   Culture     Final   Value:        BLOOD CULTURE RECEIVED NO GROWTH TO DATE CULTURE WILL BE HELD FOR 5 DAYS BEFORE ISSUING A FINAL NEGATIVE REPORT   Report Status PENDING   Incomplete    Studies/Results: Ct Pelvis Wo Contrast  05/15/2012   *RADIOLOGY REPORT*  Clinical Data: Verify location of the distal left ureteral stent.  CT PELVIS WITHOUT CONTRAST  Technique:  Multidetector CT imaging of the pelvis was performed following the standard protocol without intravenous contrast.  Comparison: Loopogram 05/15/2012 and CT abdomen pelvis 05/13/2012.  Findings: There is residual contrast in the right lower quadrant ileal conduit from loopogram performed just prior to the CT.  A left ureteral stent is in place.  Proximal loop is not imaged.  The distal loop is located in the distal left ureter, rather than the ileal conduit (image 19).  The visualized portion of the right ureter shows no hydronephrosis.  There is no contrast within the right ureter from previous loopogram performed just prior to the CT.  Retained contrast is seen in the colon.  Visualized bowel is otherwise unremarkable.  Postoperative changes of cystoprostatectomy.  No free fluid.  No pathologically enlarged lymph nodes.  No worrisome lytic or sclerotic lesions.  IMPRESSION:  1.  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  Distal loop of the left ureteral stent is seen in the distal left ureter, rather than the ileal conduit. 2.  Right hydronephrosis.  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   Original Report Authenticated By: Leanna Battles, M.D.   Dg Loopogram  05/15/2012   *  RADIOLOGY REPORT*  Clinical Data: Right  hydronephrosis.  Evaluate for ureteral leak.  LOOPOGRAM  Comparison: CT abdomen pelvis 05/13/2012.  Findings: Scout view of the abdomen shows a left ureteral stent with the distal loop projecting over the right lower quadrant.  A percutaneous nephrostomy is in place on the left.  There are extensive postoperative changes in the right abdomen.  Retained contrast is seen in the distal colon.  A 12-French Foley catheter was inserted into the right lower quadrant ileal conduit and balloon inflated.  Approximately 50-60 ml of Cystografin was hand injected.  There is opacification of the ileal conduit with reflux of contrast into the distal left ureter. The distal loop of the left ureteral stent partially projects over the ileal conduit, but not completely.  There is no reflux of contrast into the right ureter.  IMPRESSION:  1.  Distal loop of the left ureteral stent may be within the distal left ureter. 2.  Right ureter did not opacify, which can be seen with a distal ureteral stricture. 3. These results were called by telephone on 05/15/2012 at 1215 hours to Dr. Berneice Heinrich, who verbally acknowledged these results. The patient will undergo with CT pelvis without contrast to confirm left ureteral stent position.   Original Report Authenticated By: Leanna Battles, M.D.    Assessment/Plan: 1)  Staph aureus bacteremia - patient overall better.  Repeat blood cultures negative to date.  TTE not a good study.   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  Serratia.  Started Bactrim.  Treat for 5 days total.     Staci Righter, MD [facility] [phone] pager   05/17/2012, 9:11 AM

## 2012-05-17 NOTE — Progress Notes (Signed)
Peripherally Inserted Central Catheter/Midline Placement  The IV Nurse has discussed with the patient and/or persons authorized to consent for the patient, the purpose of this procedure and the potential benefits and risks involved with this procedure.  The benefits include less needle sticks, lab draws from the catheter and patient may be discharged home with the catheter.  Risks include, but not limited to, infection, bleeding, blood clot (thrombus formation), and puncture of an artery; nerve damage and irregular heat beat.  Alternatives to this procedure were also discussed.  PICC/Midline Placement Documentation        Chad Olson 05/17/2012, 2:55 PM
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
## 2012-05-17 NOTE — Discharge Summary (Signed)
Physician Discharge Summary  Chad Olson:[id number] DOB: November 12, 1945 DOA: 05/13/2012  PCP: [name], MD  Admit date: 05/13/2012 Discharge date: 05/17/2012  Time spent: Greater than 30 minutes  Recommendations for Outpatient Follow-up:  1. Dr. Wonda Olds, PCP in one week 2. Dr. Enis Slipper, Urology in 2 weeks 3. [facility]: MDs office will arrange appointment to be seen in 2 weeks. 4. Vancomycin IV per home health pharmacy protocol through 06/25/2012. Further directions per Infectious Disease M.D. Target vancomycin trough 15-20. 5. Weekly vancomycin trough, CBC and CMP by home heath services-results to be forwarded to [facility]. 6. Home health RN for IV antibiotics and safety evaluation. 7. Outpatient followup of incidental left posterior hygroma seen on head CT.  Discharge Diagnoses:  Principal Problem:   UTI (lower urinary tract infection) Active Problems:   Bladder cancer   Nephrostomy status   Bilateral hydronephrosis   Fever   Bacteremia   VTE (venous thromboembolism)   Discharge Condition: Improved & Stable  Diet recommendation: Heart healthy diet  Filed Weights   05/13/12 2100 05/13/12 2319 05/14/12 0900  Weight: 106.7 kg (235 lb 3.7 oz) 98.521 kg (217 lb 3.2 oz) 98.158 kg (216 lb 6.4 oz)    History of present illness:  67 year old male patient with history of bladder cancer, status post cystectomy with ileal conduit urinary diversion for refractory recurrent high-grade bladder cancer, urinoma/left ureteral leak/right hydronephrosis-underwent multiple urologic procedures-has left nephrostomy, recent incidental PE on full dose Lovenox, HTN, HL, diet-controlled DM 2 was admitted on 05/13/12 with complaints of fever, nausea, vomiting and malaise. In ED, repeat CT showed worsening right-sided hydronephrosis. Hospitalist admission requested.  Hospital Course:  1. Recurrent UTI/pyelonephritis (Serratia  Liquefaciens): Patient was initially started on Rocephin then switched to cefazolin and eventually changed to oral Bactrim based on sensitivity. He will complete total of 5 days of antibiotics. Infectious disease consulted.  2. MRSA bacteremia-2 blood cultures from 05/13/12 were positive. Patient was empirically started on IV cefazolin and vancomycin until sensitivities were back. He has now been transitioned to IV vancomycin alone. Surveillance blood cultures from 5/14 are negative to date. TTE was a poor study. 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 Infectious disease consulted and recommended removal of nephrostomy tube and ureteral stent. Urology consulted and indicated that he will undergo elective OP surgery in a couple of weeks. Thereby, ID recommended no need for TEE at this time since we plan to treat with IV vancomycin for 6 weeks. PICC line was placed on 5/16. Home health services arranged for IV antibiotics and safety evaluation. Patient has clinically done well and is asymptomatic and stable. Infectious disease will follow patient's weekly labs and arrange for OP follow up of patient. 3. Bladder cancer/worsening right hydronephrosis: Complicated urology history with multiple procedures. Management per urology. See above. Urology plans elective surgery in a couple of weeks after active infection has been treated for a couple of weeks. 4. VTE: Patient will continue on full dose Lovenox at a reduced dose based on his weight. 5. Hypokalemia: Replaced. 6. Nausea and vomiting:secondary to acute illness-UTI, bacteremia and hydronephrosis. No further episodes and tolerating diet well. 7. Altered mental status: Unclear etiology. No focal deficits. CT head-no acute findings.? Related to infections. Per family-seems to have resolved. 8. HTN: Controlled. 9. DM 2: Check CBGs and SSI. Controlled inpatient 10. Incidental left posterior hygroma on CT head, without mass effect :  OP follow  up.   Procedures:  PICC line 5/16   Consultations:  Urology  Infectious disease  Discharge Exam:  Complaints: Patient denies complaints. Per nursing, ambulating in room with supervision.  Filed Vitals:   05/16/12 0702 05/16/12 2200 05/17/12 0643 05/17/12 1542  BP: 125/78 125/77 124/76 128/72  Pulse: 82 84 72 72  Temp: 98.1 F (36.7 C) 98.2 F (36.8 C) 97.9 F (36.6 C) 97.6 F (36.4 C)  TempSrc: Oral Oral Oral Oral  Resp: 18 18 20 18   Height:      Weight:      SpO2: 96% 96% 98% 99%    General exam: Comfortable.  Respiratory system: Clear. No increased work of breathing.  Cardiovascular system: S1 & S2 heard, RRR. No JVD, murmurs, gallops, clicks or pedal edema.  Gastrointestinal system: Abdomen is nondistended, soft and nontender. Normal bowel sounds heard. Right-sided ileal conduit intact. Left nephrostomy intact.  Central nervous system: Alert and oriented. No focal neurological deficits.  Extremities: Symmetric 5 x 5 power.  Discharge Instructions      Discharge Orders   Future Orders Complete By Expires     Call MD for:  extreme fatigue  As directed     Call MD for:  persistant dizziness or light-headedness  As directed     Call MD for:  persistant nausea and vomiting  As directed     Call MD for:  redness, tenderness, or signs of infection (pain, swelling, redness, odor or green/yellow discharge around incision site)  As directed     Call MD for:  severe uncontrolled pain  As directed     Call MD for:  temperature >100.4  As directed     Diet - low sodium heart healthy  As directed     Increase activity slowly  As directed         Medication List    STOP taking these medications       mirtazapine 15 MG tablet  Commonly known as:  REMERON     nitrofurantoin (macrocrystal-monohydrate) 100 MG capsule  Commonly known as:  MACROBID      TAKE these medications       enoxaparin 120 MG/0.8ML injection  Commonly known as:  LOVENOX  Inject  0.67 mLs (100 mg total) into the skin every 12 (twelve) hours.     enoxaparin Kit  Commonly known as:  LOVENOX  1 kit by Does not apply route once.     feeding supplement Liqd  Take 237 mLs by mouth 2 (two) times daily between meals.     multivitamin with minerals Tabs  Take 1 tablet by mouth daily.     pantoprazole 40 MG tablet  Commonly known as:  PROTONIX  Take 40 mg by mouth daily.     pravastatin 20 MG tablet  Commonly known as:  PRAVACHOL  Take 20 mg by mouth every morning.     sodium chloride 0.9 % SOLN 250 mL with vancomycin 10 G SOLR 1,250 mg  Inject 1,250 mg into the vein every 12 (twelve) hours. Continue through June 25, 2012. Directions per Infectious Disease MD.     sulfamethoxazole-trimethoprim 800-160 MG per tablet  Commonly known as:  BACTRIM DS  Take 1 tablet by mouth every 12 (twelve) hours.       Follow-up Information   Follow up with [name], MD. Schedule an appointment as soon as possible for a visit in 1 week.   Contact information:   [address] [place]  [address] [place] [address] [phone]       Follow up with Sebastian Ache, MD. Schedule an appointment as soon as possible for a visit in 2 weeks.   Contact information:   [address] [place] [address] [phone]       Follow up with [facility]. (MD's office will call with appointment to be seen in 2 weeks.)    Contact information:   [address] [place] [address] [phone]       The results of significant diagnostics from this hospitalization (including imaging, microbiology, ancillary and laboratory) are listed below for reference.    Significant Diagnostic Studies: Dg Chest 2 View  05/13/2012   *RADIOLOGY REPORT*  Clinical Data: Fever, shortness of breath.  CHEST - 2 VIEW  Comparison: 02/12/2012  Findings: Heart is normal size.  No confluent airspace opacities or effusions.  No acute bony  abnormality.  IMPRESSION: No acute findings.   Original Report Authenticated By: Charlett Nose, M.D.   Ct Head Wo Contrast  05/14/2012   *RADIOLOGY REPORT*  Clinical Data: Dizzy with confusion.  History of bladder cancer.  CT HEAD WITHOUT CONTRAST  Technique:  Contiguous axial images were obtained from the base of the skull through the vertex without contrast.  Comparison: None.  Findings: There is no evidence for acute infarction, intracranial hemorrhage, mass lesion, or hydrocephalus.  Slight posterior fossa hygroma on the left of doubtful significance. Mild atrophy.  Mild chronic microvascular ischemic change.  Calvarium intact.  No sinus disease.  Chronic right mastoiditis.  IMPRESSION: No acute intracranial findings.  Slight left posterior fossa extra- axial collection, non compressive, consistent with incidental hygroma.    No intracranial mass lesion to suggest metastatic disease.   Original Report Authenticated By: Davonna Belling, M.D.   Ct Pelvis Wo Contrast  05/15/2012   *RADIOLOGY REPORT*  Clinical Data: Verify location of the distal left ureteral stent.  CT PELVIS WITHOUT CONTRAST  Technique:  Multidetector CT imaging of the pelvis was performed following the standard protocol without intravenous contrast.  Comparison: Loopogram 05/15/2012 and CT abdomen pelvis 05/13/2012.  Findings: There is residual contrast in the right lower quadrant ileal conduit from loopogram performed just prior to the CT.  A left ureteral stent is in place.  Proximal loop is not imaged.  The distal loop is located in the distal left ureter, rather than the ileal conduit (image 19).  The visualized portion of the right ureter shows no hydronephrosis.  There is no contrast within the right ureter from previous loopogram performed just prior to the CT.  Retained contrast is seen in the colon.  Visualized bowel is otherwise unremarkable.  Postoperative changes of cystoprostatectomy.  No free fluid.  No pathologically enlarged lymph  nodes.  No worrisome lytic or sclerotic lesions.  IMPRESSION:  1.  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  Distal loop of the left ureteral stent is seen in the distal left ureter, rather than the ileal conduit. 2.  Right hydronephrosis.  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   Original Report Authenticated By: Leanna Battles, M.D.   Ct Abdomen Pelvis W Contrast  05/13/2012   *RADIOLOGY REPORT*  Clinical Data: Chest pain, short of breath, history of bladder carcinoma  CT ABDOMEN AND PELVIS WITH CONTRAST  Technique:  Multidetector CT imaging  of the abdomen and pelvis was performed following the standard protocol during bolus administration of intravenous contrast.  Contrast: 50mL OMNIPAQUE IOHEXOL 300 MG/ML  SOLN, OMNIPAQUE IOHEXOL 300 MG/ML  SOLN  Comparison: CT abdomen pelvis of 03/25/2012  Findings: The lung bases are clear.  The liver enhances with no focal abnormality and no ductal dilatation is noted.  No calcified gallstones are seen although there is slightly higher attenuation debris layering in the gallbladder which may represent gallbladder sludge or noncalcified gallstones.  The pancreas is normal in size and the pancreatic duct is not dilated.  The adrenal glands and spleen are unremarkable.  The stomach is not well distended.  The kidneys faintly enhance and there is a left percutaneous nephrostomy as well as a left double-J stent present within the left renal pelvis.  Bilateral renal cysts are noted.  There is moderate right hydronephrosis which has increased slightly since the prior CT.  The right ureter is dilated to the insertion into the ileal conduit.  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 This probable  urinoma now measures 3.9 x 1.8 cm compared to prior measurements of 4.6 x 7.0 cm.  Stenosis of the distal right ureter at this site cannot be excluded in view of the slightly increased right hydronephrosis.  A complex cyst is noted posteriorly in the left kidney.  The position of the distal aspect of the double-J stent on the left is uncertain, possibly either within the distal left ureter near the or near the insertion into the ileal conduit.  The patient has undergone excision of the prostate and urinary bladder.  There is a small amount of fluid within the pelvis.  This would be unlikely to be postoperative in nature in view of the time interval since the prior surgery and therefore a leak cannot be excluded.  Clinical correlation is recommended.  No abnormality of the colon is seen.  The terminal ileum and the appendix are unremarkable.  IMPRESSION:  1.  Slight increase in degree of right hydronephrosis and right hydroureter to the point of insertion into the ileal conduit. Cannot exclude mild distal right ureteral stricture. 2.  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  3.  Some fluid is noted within the pelvis, somewhat unusual for postop fluid in view of the time interval since the surgery. Therefore cannot exclude a leak.   Original Report Authenticated By: Dwyane Dee, M.D.   Dg Loopogram  05/15/2012   *RADIOLOGY REPORT*  Clinical Data: Right hydronephrosis.  Evaluate for ureteral leak.  LOOPOGRAM  Comparison: CT abdomen pelvis 05/13/2012.  Findings: Scout view of the abdomen shows a left ureteral stent with the distal loop projecting over the right lower quadrant.  A percutaneous nephrostomy is in place on the left.  There are extensive postoperative changes in the right abdomen.  Retained contrast is seen in the distal colon.  A 12-French Foley catheter was inserted into the right lower quadrant ileal conduit and balloon inflated.  Approximately 50-60 ml of Cystografin was hand  injected.  There is opacification of the ileal conduit with reflux of contrast into the distal left ureter. The distal loop of the left ureteral stent partially projects over the ileal conduit, but not completely.  There is no reflux of contrast into the right ureter.  IMPRESSION:  1.  Distal loop of the left ureteral stent  may be within the distal left ureter. 2.  Right ureter did not opacify, which can be seen with a distal ureteral stricture. 3. These results were called by telephone on 05/15/2012 at 1215 hours to Dr. Berneice Heinrich, who verbally acknowledged these results. The patient will undergo with CT pelvis without contrast to confirm left ureteral stent position.   Original Report Authenticated By: Leanna Battles, M.D.   Dg Nephrostogram Left  05/06/2012   *RADIOLOGY REPORT*  Clinical Data: 67 year old male  - follow-up distal left ureteral leak near the ileal conduit anastomosis.  LEFT NEPHROSTOGRAM  Technique: 50 ml of Omnipaque-300 was injected the patient's left nephrostomy tube and images obtained.  Fluoro time:  2 minutes 0 seconds  Comparison:  03/28/2012 exam  Findings: Scout film of the abdomen demonstrates a left urinary stent and left nephrostomy tube.  After injection of contrast, blunting of the left renal calyces is noted. Contrast extends to the ileal conduit through both the ureteral stent and ureter without obstruction. There is no evidence of ureteral leak. No significant abnormalities are noted.  IMPRESSION: No evidence of ureteral leak, specifically at the site of previously identified extravasation.   Original Report Authenticated By: Harmon Pier, M.D.    Microbiology: Recent Results (from the past 240 hour(s))  CULTURE, BLOOD (ROUTINE X 2)     Status: None   Collection Time    05/13/12  6:40 PM      Result Value Range Status   Specimen Description BLOOD LEFT ARM   Final   Special Requests BOTTLES DRAWN AEROBIC AND ANAEROBIC 4CC   Final   Culture  Setup Time 05/13/2012 23:52   Final    Culture     Final   Value: METHICILLIN RESISTANT STAPHYLOCOCCUS AUREUS     Note: RIFAMPIN AND GENTAMICIN SHOULD NOT BE USED AS SINGLE DRUGS FOR TREATMENT OF STAPH INFECTIONS. CRITICAL RESULT CALLED TO, READ BACK BY AND VERIFIED WITH: VASHONDA F @ [phone] 05/16/12 BY KRAWS     Note: Gram Stain Report Called to,Read Back By and Verified With: GRACE SISON 05/14/12 1525 BY SMITHERSJ   Report Status 05/16/2012 FINAL   Final   Organism ID, Bacteria METHICILLIN RESISTANT STAPHYLOCOCCUS AUREUS   Final  URINE CULTURE     Status: None   Collection Time    05/13/12  6:42 PM      Result Value Range Status   Specimen Description URINE, SUPRAPUBIC   Final   Special Requests NONE   Final   Culture  Setup Time 05/14/2012 02:09   Final   Colony Count >=100,000 COLONIES/ML   Final   Culture SERRATIA LIQUEFACIENS   Final   Report Status 05/15/2012 FINAL   Final   Organism ID, Bacteria SERRATIA LIQUEFACIENS   Final  CULTURE, BLOOD (ROUTINE X 2)     Status: None   Collection Time    05/13/12  6:50 PM      Result Value Range Status   Specimen Description BLOOD RIGHT ARM   Final   Special Requests BOTTLES DRAWN AEROBIC AND ANAEROBIC 2CC   Final   Culture  Setup Time 05/13/2012 23:52   Final   Culture     Final   Value: STAPHYLOCOCCUS AUREUS     Note: SUSCEPTIBILITIES PERFORMED ON PREVIOUS CULTURE WITHIN THE LAST 5 DAYS.     Note: Gram Stain Report Called to,Read Back By and Verified With: MONICA CLAYTON 05/15/12 AT 0210 RIDK   Report Status 05/16/2012 FINAL   Final  MRSA PCR SCREENING  Status: Abnormal   Collection Time    05/14/12 12:41 AM      Result Value Range Status   MRSA by PCR POSITIVE (*) NEGATIVE Final   Comment:            The GeneXpert MRSA Assay (FDA     approved for NASAL specimens     only), is one component of a     comprehensive MRSA colonization     surveillance program. It is not     intended to diagnose MRSA     infection nor to guide or     monitor treatment for     MRSA  infections.     RESULT CALLED TO, READ BACK BY AND VERIFIED WITH:     L. HARRIS RN AT 0310 ON 05.13.14 BY SHUEA  CULTURE, BLOOD (ROUTINE X 2)     Status: None   Collection Time    05/15/12  1:57 PM      Result Value Range Status   Specimen Description BLOOD LEFT HAND   Final   Special Requests BOTTLES DRAWN AEROBIC ONLY 3CC   Final   Culture  Setup Time 05/15/2012 17:05   Final   Culture     Final   Value:        BLOOD CULTURE RECEIVED NO GROWTH TO DATE CULTURE WILL BE HELD FOR 5 DAYS BEFORE ISSUING A FINAL NEGATIVE REPORT   Report Status PENDING   Incomplete  CULTURE, BLOOD (ROUTINE X 2)     Status: None   Collection Time    05/15/12  2:05 PM      Result Value Range Status   Specimen Description BLOOD RIGHT ARM   Final   Special Requests BOTTLES DRAWN AEROBIC AND ANAEROBIC 5CC   Final   Culture  Setup Time 05/15/2012 17:05   Final   Culture     Final   Value:        BLOOD CULTURE RECEIVED NO GROWTH TO DATE CULTURE WILL BE HELD FOR 5 DAYS BEFORE ISSUING A FINAL NEGATIVE REPORT   Report Status PENDING   Incomplete     Labs: Basic Metabolic Panel:  Recent Labs Lab 05/13/12 1650 05/13/12 1700 05/14/12 0433 05/15/12 0410  NA 134* 136 142 141  K 3.1* 3.1* 3.2* 3.6  CL 97 99 106 105  CO2 26  --  28 27  GLUCOSE 143* 148* 121* 126*  BUN 12 12 9 7   CREATININE 1.05 1.00 0.92 0.94  CALCIUM 8.8  --  8.7 8.5   Liver Function Tests: No results found for this basename: AST, ALT, ALKPHOS, BILITOT, PROT, ALBUMIN,  in the last 168 hours  Recent Labs Lab 05/13/12 1650  LIPASE 25   No results found for this basename: AMMONIA,  in the last 168 hours CBC:  Recent Labs Lab 05/13/12 1650 05/13/12 1700 05/14/12 0433 05/15/12 0410  WBC 7.4  --  6.7 6.0  HGB 13.4 14.3 12.8* 12.4*  HCT 40.3 42.0 38.6* 38.1*  MCV 89.6  --  90.6 91.4  PLT 363  --  354 350   Cardiac Enzymes: No results found for this basename: CKTOTAL, CKMB, CKMBINDEX, TROPONINI,  in the last 168 hours BNP: BNP  (last 3 results) No results found for this basename: PROBNP,  in the last 8760 hours CBG:  Recent Labs Lab 05/16/12 1130 05/16/12 1709 05/16/12 2030 05/17/12 0803 05/17/12 1141  GLUCAP 109* 138* 136* 126* 113*    Additional labs: 2-D echo Study  Conclusions  - Procedure narrative: Transthoracic echocardiography. Image quality was poor. The study was technically difficult, as a result of poor acoustic windows, poor sound wave transmission, and body habitus.  - Left ventricle: The cavity size was grosslynormal. There was moderate concentric hypertrophy. Systolic function was vigorous. The estimated ejection fraction was in the range of 65% to 70%. Doppler parameters are consistent with abnormal left ventricular relaxation (grade 1 diastolic dysfunction). The E/e' ratio is >10, suggesting elevated LV filling pressure. - Aortic valve: Poorly visualized. Sclerotic at the least. There is trace to mild AI. - Mitral valve: Mildly thickened leaflets . No significant regurgitation.  Impressions:  - Poorly visualized cardiac structures. Contrast was not used. If clinical concern for endocarditis is high, TEE would be recommended.   SignedMarcellus Scott  [facility] 05/17/2012, 4:46 PM

## 2012-05-21 LAB — CULTURE, BLOOD (ROUTINE X 2): Culture: NO GROWTH

## 2012-05-30 ENCOUNTER — Ambulatory Visit (INDEPENDENT_AMBULATORY_CARE_PROVIDER_SITE_OTHER): Payer: BC Managed Care – PPO | Admitting: Internal Medicine

## 2012-05-30 ENCOUNTER — Telehealth: Payer: Self-pay | Admitting: Internal Medicine

## 2012-05-30 ENCOUNTER — Encounter: Payer: Self-pay | Admitting: Internal Medicine

## 2012-05-30 VITALS — BP 136/90 | HR 85 | Temp 98.1°F | Wt 227.0 lb

## 2012-05-30 DIAGNOSIS — A4902 Methicillin resistant Staphylococcus aureus infection, unspecified site: Secondary | ICD-10-CM

## 2012-05-30 DIAGNOSIS — Z9889 Other specified postprocedural states: Secondary | ICD-10-CM

## 2012-05-30 DIAGNOSIS — R7881 Bacteremia: Secondary | ICD-10-CM

## 2012-05-30 DIAGNOSIS — Z95828 Presence of other vascular implants and grafts: Secondary | ICD-10-CM

## 2012-05-30 DIAGNOSIS — I749 Embolism and thrombosis of unspecified artery: Secondary | ICD-10-CM

## 2012-05-30 DIAGNOSIS — I8290 Acute embolism and thrombosis of unspecified vein: Secondary | ICD-10-CM

## 2012-05-30 NOTE — Progress Notes (Signed)
[facility] CLINIC NOTE  RFV: MRSA bacteremia hospital follow up Subjective:    Patient ID: Chad Olson, male    DOB: [birth year], 67 y.o.   MRN: [id number]  HPI  Chad Olson is a 67yo Male with hx of bladder ca s/p cystecotmy with ileal conduit urianry diversion for refractory recurrent high-grade bladder ca, urinoma/left uerteral leak/right hydronephrosis, now has left nephrostomy, requiring numerous urologic surgeries. He presented with N/V, fevers and malaise which his work up showed right sided hydronephrosis where he was hospitalized from 5/12-5/16 for serratia pyelonephritis as well as staph aureus (MRSA) bacteremia. He was treated for serratia UTI with bactrim and recommended to have IV vancomycin for 6 wks for complicated bacteremia due to MRSA also known to be in teh urostomy tube in place in the past. Since being on IV antibiotics - patient overall better. Repeat blood cultures negative to date. TTE not a good study. Deferred getting TEE since he will be on IV antiobitcs for 6 wks. PICC placed on 5/16.  Patient has several questions at this visit: 1) urology wanted to know when he would be clear of infection to proceed with further urologic surgery. 2) he was diagnosed with VTE and started on lovenox but they are unsure who is managing it.  Patient denies fever ,chills, nightsweats, no pain at picc line, no difficulty with infusions.    Current Outpatient Prescriptions on File Prior to Visit  Medication Sig Dispense Refill  . enoxaparin (LOVENOX) KIT 1 kit by Does not apply route once.  1 kit    . feeding supplement (GLUCERNA SHAKE) LIQD Take 237 mLs by mouth 2 (two) times daily between meals.      . Multiple Vitamin (MULTIVITAMIN WITH MINERALS) TABS Take 1 tablet by mouth daily.      . pantoprazole (PROTONIX) 40 MG tablet Take 40 mg by mouth daily.      . pravastatin (PRAVACHOL) 20 MG tablet Take 20 mg by mouth every morning.       . sodium chloride 0.9 % SOLN 250 mL with vancomycin 10 G  SOLR 1,250 mg Inject 1,250 mg into the vein every 12 (twelve) hours. Continue through June 25, 2012. Directions per Infectious Disease MD.  1 each  0  . sulfamethoxazole-trimethoprim (BACTRIM DS) 800-160 MG per tablet Take 1 tablet by mouth every 12 (twelve) hours.  7 tablet  0   No current facility-administered medications on file prior to visit.   Active Ambulatory Problems    Diagnosis Date Noted  . GERD 10/24/2007  . OTHER DYSPHAGIA 09/19/2007  . NONSPECIFIC ABNORMAL FIND RAD&OTH EXAM GI TRACT 09/19/2007  . Bladder cancer 08/21/2011  . Bilateral renal cysts 03/25/2012  . Leukocytosis, unspecified 03/25/2012  . HTN (hypertension) 03/25/2012  . Acute pulmonary embolism (3/14) 03/25/2012  . Nausea alone 03/25/2012  . 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 UTI (lower urinary tract infection) 05/13/2012  . Bilateral hydronephrosis 05/13/2012  . DVT (deep venous thrombosis) 3/14 05/13/2012  . Fever 05/13/2012  . Bacteremia 05/14/2012  . VTE (venous thromboembolism) 05/16/2012   Resolved Ambulatory Problems    Diagnosis Date Noted  . No Resolved Ambulatory Problems   Past Medical History  Diagnosis Date  . Hypertension   . Hyperlipemia   . Impaired hearing BILATERAL AIDS  . History of concussion 1992  . Acid reflux WATCHES DIET  . Diet-controlled type 2 diabetes mellitus   . Arthritis   . Hemorrhoids   . Carcinoma in situ of bladder RECURRENT  . Bilateral  leg pain   . History of basal cell carcinoma excision BASE OF LEFT EAR  . Erythrocytosis LABS STABLE     Review of Systems  Appetite improved and gaining weight back. He previously was 273 # back in February but has had numerous surgeries and hospitalizations.  12 point ROS is otherwise negative    Objective:   Physical Exam BP 136/90  Pulse 85  Temp(Src) 98.1 F (36.7 C) (Oral)  Wt 227 lb (102.967 kg)  BMI 31.67 kg/m2 Physical Exam  Constitutional: He is oriented to person, place, and time. He appears  well-developed and well-nourished. No distress.  HENT:  Mouth/Throat: Oropharynx is clear and moist. No oropharyngeal exudate.  Cardiovascular: Normal rate, regular rhythm and normal heart sounds. Exam reveals no gallop and no friction rub.  No murmur heard.  Pulmonary/Chest: Effort normal and breath sounds normal. No respiratory distress. He has no wheezes.  Abdominal: Soft. Bowel sounds are normal. He exhibits no distension. There is no tenderness. Left nephstromy no discomfort with palpation. Lymphadenopathy: no cervical adenopathy.  Neurological: He is alert and oriented to person, place, and time.  Skin: Skin is warm and dry. No rash noted. No erythema.  Psychiatric: He has a normal mood and affect. His behavior is normal.  Ext: right picc line slight erythema at entrance site, outlined today      Assessment & Plan:  MRSA bacteremia = continues to do well with vancomycin. To be on vanco thru June 24th. He still has   urostomy tube in place ( that had previous + ur cx that were  Unable to removed) thus still has nidus.    - recommend that he has nephrostomy tube pulled prior to completion of his antibiotic course if possible. He is supposed to have another upcoming uro surgery and wonder if this can be coordinated at the same time.  - continue with home health with weekly vanco trough, cbc, cmp to [facility]  -antibiotics per home health protocol with vanco trough of 15-20  -we will arrange follow up in [facility] on June 24th to pull picc.  Redness at picc line = it appears benign. i have outlined it. If it worsens, will ask them to call us to arrange for replacement   DVT = doesn't know who is managing the anticoagulation.  Will reach out to his pcp and his cardiologist, Dr. Rennis Golden. Wondering if they can manage anticoagulation   [facility] ( Dr. Berneice Heinrich)  Seeing next Tuesday.  rtc on June 24th..  30 min spent with patient with greater than 50% spent in coordination of care for DVT and  MRSA bacteremia

## 2012-05-30 NOTE — Telephone Encounter (Signed)
Dr.Synder is wanting to know if Dr. Rennis Golden will be monitoring his levels while he is on Lovenox .please call [phone]   Thanks

## 2012-05-30 NOTE — Telephone Encounter (Signed)
Pt with h/o DVT and PE.  Pt on lovenox 120 BID and seen post hospital on 5.7.14 per paper chart.  Dr. Rennis Golden notified and advised pt can be monitored in our office.  Advised RN talk w/ Belenda Cruise, PharmD.  Belenda Cruise notified and will discuss with Dr. Rennis Golden tomorrow in clinic for plan.  Returned call and left message that Dr. Rennis Golden will review pt's chart tomorrow and RN will call back with more information.

## 2012-05-31 NOTE — Telephone Encounter (Signed)
Paper chart on Dr. Blanchie Dessert cart ([id number]) for review and discussion w/ Belenda Cruise.

## 2012-05-31 NOTE — Telephone Encounter (Signed)
Spoke w/ Dr. Rennis Golden, we will authorize refills on lovenox 120mg  bid for patient as needed.  Hx of PE/DVT w/ bladder cancer, not comfortable with using NOAC or warfarin at this time.

## 2012-06-02 ENCOUNTER — Inpatient Hospital Stay (HOSPITAL_COMMUNITY)
Admission: EM | Admit: 2012-06-02 | Discharge: 2012-06-06 | DRG: 569 | Disposition: A | Discharge status: Home Health | Payer: BC Managed Care – PPO | Attending: Internal Medicine | Admitting: Internal Medicine

## 2012-06-02 ENCOUNTER — Emergency Department (HOSPITAL_COMMUNITY): Payer: BC Managed Care – PPO

## 2012-06-02 ENCOUNTER — Encounter (HOSPITAL_COMMUNITY): Payer: Self-pay | Admitting: Emergency Medicine

## 2012-06-02 DIAGNOSIS — C679 Malignant neoplasm of bladder, unspecified: Secondary | ICD-10-CM

## 2012-06-02 DIAGNOSIS — N1330 Unspecified hydronephrosis: Principal | ICD-10-CM

## 2012-06-02 DIAGNOSIS — E785 Hyperlipidemia, unspecified: Secondary | ICD-10-CM | POA: Diagnosis present

## 2012-06-02 DIAGNOSIS — N179 Acute kidney failure, unspecified: Secondary | ICD-10-CM | POA: Diagnosis present

## 2012-06-02 DIAGNOSIS — E876 Hypokalemia: Secondary | ICD-10-CM | POA: Diagnosis present

## 2012-06-02 DIAGNOSIS — D638 Anemia in other chronic diseases classified elsewhere: Secondary | ICD-10-CM | POA: Diagnosis present

## 2012-06-02 DIAGNOSIS — Z86718 Personal history of other venous thrombosis and embolism: Secondary | ICD-10-CM

## 2012-06-02 DIAGNOSIS — R110 Nausea: Secondary | ICD-10-CM

## 2012-06-02 DIAGNOSIS — Z936 Other artificial openings of urinary tract status: Secondary | ICD-10-CM

## 2012-06-02 DIAGNOSIS — Z113 Encounter for screening for infections with a predominantly sexual mode of transmission: Secondary | ICD-10-CM

## 2012-06-02 DIAGNOSIS — Z7901 Long term (current) use of anticoagulants: Secondary | ICD-10-CM

## 2012-06-02 DIAGNOSIS — R509 Fever, unspecified: Secondary | ICD-10-CM

## 2012-06-02 DIAGNOSIS — I2699 Other pulmonary embolism without acute cor pulmonale: Secondary | ICD-10-CM

## 2012-06-02 DIAGNOSIS — K219 Gastro-esophageal reflux disease without esophagitis: Secondary | ICD-10-CM | POA: Diagnosis present

## 2012-06-02 DIAGNOSIS — R7881 Bacteremia: Secondary | ICD-10-CM

## 2012-06-02 DIAGNOSIS — I10 Essential (primary) hypertension: Secondary | ICD-10-CM

## 2012-06-02 DIAGNOSIS — I82409 Acute embolism and thrombosis of unspecified deep veins of unspecified lower extremity: Secondary | ICD-10-CM

## 2012-06-02 DIAGNOSIS — A4902 Methicillin resistant Staphylococcus aureus infection, unspecified site: Secondary | ICD-10-CM | POA: Diagnosis present

## 2012-06-02 DIAGNOSIS — Z79899 Other long term (current) drug therapy: Secondary | ICD-10-CM

## 2012-06-02 DIAGNOSIS — I2782 Chronic pulmonary embolism: Secondary | ICD-10-CM | POA: Diagnosis present

## 2012-06-02 DIAGNOSIS — E119 Type 2 diabetes mellitus without complications: Secondary | ICD-10-CM | POA: Diagnosis present

## 2012-06-02 DIAGNOSIS — N281 Cyst of kidney, acquired: Secondary | ICD-10-CM

## 2012-06-02 HISTORY — DX: Malignant (primary) neoplasm, unspecified: C80.1

## 2012-06-02 LAB — BASIC METABOLIC PANEL
BUN: 13 mg/dL (ref 6–23)
CO2: 27 mEq/L (ref 19–32)
Calcium: 9.6 mg/dL (ref 8.4–10.5)
Chloride: 106 mEq/L (ref 96–112)
Creatinine, Ser: 1.45 mg/dL — ABNORMAL HIGH (ref 0.50–1.35)
GFR calc Af Amer: 56 mL/min — ABNORMAL LOW (ref >90)
GFR calc non Af Amer: 49 mL/min — ABNORMAL LOW (ref >90)
Glucose, Bld: 108 mg/dL — ABNORMAL HIGH (ref 70–99)
Potassium: 3.5 mEq/L (ref 3.5–5.1)
Sodium: 145 mEq/L (ref 135–145)

## 2012-06-02 LAB — CBC WITH DIFFERENTIAL/PLATELET
Basophils Absolute: 0 10*3/uL (ref 0.0–0.1)
Basophils Relative: 0 % (ref 0–1)
Eosinophils Absolute: 0.1 10*3/uL (ref 0.0–0.7)
Eosinophils Relative: 1 % (ref 0–5)
HCT: 43.2 % (ref 39.0–52.0)
Hemoglobin: 13.7 g/dL (ref 13.0–17.0)
Lymphocytes Relative: 14 % (ref 12–46)
Lymphs Abs: 1.4 10*3/uL (ref 0.7–4.0)
MCH: 29.1 pg (ref 26.0–34.0)
MCHC: 31.7 g/dL (ref 30.0–36.0)
MCV: 91.7 fL (ref 78.0–100.0)
Monocytes Absolute: 1 10*3/uL (ref 0.1–1.0)
Monocytes Relative: 11 % (ref 3–12)
Neutro Abs: 7 10*3/uL (ref 1.7–7.7)
Neutrophils Relative %: 74 % (ref 43–77)
Platelets: 200 10*3/uL (ref 150–400)
RBC: 4.71 MIL/uL (ref 4.22–5.81)
RDW: 15.1 % (ref 11.5–15.5)
WBC: 9.5 10*3/uL (ref 4.0–10.5)

## 2012-06-02 LAB — CG4 I-STAT (LACTIC ACID): Lactic Acid, Venous: 1.52 mmol/L (ref 0.5–2.2)

## 2012-06-02 LAB — HEPATIC FUNCTION PANEL
ALT: 11 U/L (ref 0–53)
Bilirubin, Direct: 0.1 mg/dL (ref 0.0–0.3)
Indirect Bilirubin: 0.5 mg/dL (ref 0.3–0.9)
Total Protein: 8 g/dL (ref 6.0–8.3)

## 2012-06-02 LAB — URINALYSIS, ROUTINE W REFLEX MICROSCOPIC
Bilirubin Urine: NEGATIVE
Glucose, UA: NEGATIVE mg/dL
Ketones, ur: NEGATIVE mg/dL
Nitrite: NEGATIVE
Protein, ur: NEGATIVE mg/dL
Specific Gravity, Urine: 1.005 (ref 1.005–1.030)
Urobilinogen, UA: 0.2 mg/dL (ref 0.0–1.0)
pH: 6.5 (ref 5.0–8.0)

## 2012-06-02 LAB — URINE MICROSCOPIC-ADD ON

## 2012-06-02 MED ORDER — IOHEXOL 300 MG/ML  SOLN
50.0000 mL | Freq: Once | INTRAMUSCULAR | Status: AC | PRN
  Administered 2012-06-02: 50 mL via ORAL

## 2012-06-02 MED ORDER — ONDANSETRON HCL 4 MG/2ML IJ SOLN: 4.0000 mg | Freq: Four times a day (QID) | INTRAMUSCULAR | Status: DC | PRN

## 2012-06-02 MED ORDER — VANCOMYCIN HCL IN DEXTROSE 1-5 GM/200ML-% IV SOLN
1000.0000 mg | Freq: Once | INTRAVENOUS | Status: AC
  Administered 2012-06-02: 1000 mg via INTRAVENOUS
  Filled 2012-06-02: qty 200

## 2012-06-02 MED ORDER — PIPERACILLIN-TAZOBACTAM 3.375 G IVPB
3.3750 g | Freq: Three times a day (TID) | INTRAVENOUS | Status: DC
  Administered 2012-06-02 – 2012-06-03 (×3): 3.375 g via INTRAVENOUS
  Filled 2012-06-02 (×4): qty 50

## 2012-06-02 MED ORDER — ALUM & MAG HYDROXIDE-SIMETH 200-200-20 MG/5ML PO SUSP: 30.0000 mL | Freq: Four times a day (QID) | ORAL | Status: DC | PRN

## 2012-06-02 MED ORDER — ONDANSETRON HCL 4 MG PO TABS: 4.0000 mg | ORAL_TABLET | Freq: Four times a day (QID) | ORAL | Status: DC | PRN

## 2012-06-02 MED ORDER — PANTOPRAZOLE SODIUM 40 MG PO TBEC
40.0000 mg | DELAYED_RELEASE_TABLET | Freq: Every day | ORAL | Status: DC
  Administered 2012-06-02 – 2012-06-06 (×5): 40 mg via ORAL
  Filled 2012-06-02 (×5): qty 1

## 2012-06-02 MED ORDER — VANCOMYCIN HCL 10 G IV SOLR
1250.0000 mg | Freq: Two times a day (BID) | INTRAVENOUS | Status: DC
  Administered 2012-06-03 (×2): 1250 mg via INTRAVENOUS
  Filled 2012-06-02 (×4): qty 1250

## 2012-06-02 MED ORDER — PIPERACILLIN-TAZOBACTAM 3.375 G IVPB 30 MIN
3.3750 g | Freq: Once | INTRAVENOUS | Status: AC
  Administered 2012-06-02: 3.375 g via INTRAVENOUS
  Filled 2012-06-02: qty 50

## 2012-06-02 MED ORDER — ENOXAPARIN SODIUM 120 MG/0.8ML ~~LOC~~ SOLN
105.0000 mg | Freq: Two times a day (BID) | SUBCUTANEOUS | Status: DC
  Administered 2012-06-02 – 2012-06-06 (×8): 105 mg via SUBCUTANEOUS
  Filled 2012-06-02 (×10): qty 0.8

## 2012-06-02 MED ORDER — SODIUM CHLORIDE 0.9 % IV SOLN
INTRAVENOUS | Status: DC
  Administered 2012-06-02: via INTRAVENOUS

## 2012-06-02 MED ORDER — ADULT MULTIVITAMIN W/MINERALS CH
1.0000 | ORAL_TABLET | Freq: Every day | ORAL | Status: DC
  Administered 2012-06-02 – 2012-06-06 (×5): 1 via ORAL
  Filled 2012-06-02 (×5): qty 1

## 2012-06-02 MED ORDER — ACETAMINOPHEN 325 MG PO TABS
650.0000 mg | ORAL_TABLET | Freq: Once | ORAL | Status: AC
  Administered 2012-06-02: 650 mg via ORAL
  Filled 2012-06-02: qty 2

## 2012-06-02 MED ORDER — SODIUM CHLORIDE 0.9 % IV SOLN
INTRAVENOUS | Status: DC
  Administered 2012-06-02: 17:00:00 via INTRAVENOUS

## 2012-06-02 MED ORDER — IOHEXOL 300 MG/ML  SOLN
100.0000 mL | Freq: Once | INTRAMUSCULAR | Status: AC | PRN
  Administered 2012-06-02: 100 mL via INTRAVENOUS

## 2012-06-02 MED ORDER — ONDANSETRON HCL 4 MG/2ML IJ SOLN: 4.0000 mg | Freq: Three times a day (TID) | INTRAMUSCULAR | Status: AC | PRN

## 2012-06-02 NOTE — ED Notes (Signed)
Patient transported to CT 
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
## 2012-06-02 NOTE — ED Notes (Signed)
Pt here stating he is tired, hurting and wants to sleep all the time. Pt had bladder and prostate removed Feb 22, 2012
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
## 2012-06-03 DIAGNOSIS — R509 Fever, unspecified: Secondary | ICD-10-CM

## 2012-06-03 DIAGNOSIS — I2699 Other pulmonary embolism without acute cor pulmonale: Secondary | ICD-10-CM

## 2012-06-03 DIAGNOSIS — M79609 Pain in unspecified limb: Secondary | ICD-10-CM

## 2012-06-03 LAB — BASIC METABOLIC PANEL
Calcium: 8.8 mg/dL (ref 8.4–10.5)
Chloride: 105 mEq/L (ref 96–112)
Creatinine, Ser: 1.38 mg/dL — ABNORMAL HIGH (ref 0.50–1.35)
GFR calc Af Amer: 60 mL/min — ABNORMAL LOW (ref >90)

## 2012-06-03 LAB — CBC
MCH: 28.3 pg (ref 26.0–34.0)
MCV: 91.6 fL (ref 78.0–100.0)
Platelets: 199 10*3/uL (ref 150–400)
RDW: 15.3 % (ref 11.5–15.5)
WBC: 6.6 10*3/uL (ref 4.0–10.5)

## 2012-06-03 LAB — VANCOMYCIN, TROUGH: Vancomycin Tr: 23.9 ug/mL — ABNORMAL HIGH (ref 10.0–20.0)

## 2012-06-03 MED ORDER — POTASSIUM CHLORIDE CRYS ER 20 MEQ PO TBCR
40.0000 meq | EXTENDED_RELEASE_TABLET | Freq: Two times a day (BID) | ORAL | Status: AC
  Administered 2012-06-03 (×2): 40 meq via ORAL
  Filled 2012-06-03 (×3): qty 2

## 2012-06-03 MED ORDER — VANCOMYCIN HCL IN DEXTROSE 1-5 GM/200ML-% IV SOLN
1000.0000 mg | Freq: Two times a day (BID) | INTRAVENOUS | Status: DC
  Administered 2012-06-04 – 2012-06-06 (×5): 1000 mg via INTRAVENOUS
  Filled 2012-06-03 (×5): qty 200

## 2012-06-03 MED ORDER — SODIUM CHLORIDE 0.9 % IV SOLN
INTRAVENOUS | Status: AC
  Administered 2012-06-03 (×2): via INTRAVENOUS

## 2012-06-03 NOTE — Progress Notes (Signed)
*  Preliminary Results* Left lower extremity venous duplex completed. Left lower extremity is negative for deep vein thrombosis. There is no evidence of left Baker's cyst.  06/03/2012 2:22 PM Gertie Fey, RDMS, RDCS

## 2012-06-03 NOTE — Progress Notes (Signed)
Patient ID: DEMARQUIS OSLEY, male   DOB: 1945-06-08, 67 y.o.   MRN: [id number]  [facility] PROGRESS NOTE  HENDRICK PAVICH JXB:[id number] DOB: November 11, 1945 DOA: 06/02/2012 PCP: [name], MD  Brief narrative: Pt is 67 yo male with h/o bladder cancer, s/p nephrostomy placement with recent (3 weeks ago) MRSA bacteremia is on iv vancomycin at home with plan for urology to replace/remove his current left nephrostomy after several weeks of abx. He had repeat cultures prior to his recent d/c which were negative. He now presented to [facility] ED with main concern of progressive generalized malaise, poor oral intake, subjective fevers, chills. He denies any specific abdominal or urinary concerns. In ED, he was noted to have low grade fever of 99.5, Ct abdomen and pelvis with persistent bilateral hydronephrosis. [facility] asked to admit for further evaluation. Of note, pt was discharged home on 05/13/2012 with IV Vancomycin and PICC line.  Principal Problem:   Fever - unclear etiology, will ask ID for further assistance in management as this appears to be very complex - source could include PICC line, nephrostomy tubes, etc - continue broad spectrum ABX for now and follow up on ID recommendations  - follow up on blood and urine cultures Active Problems:   Bladder cancer - follows with Dr. Clelia Croft, will alert primary oncologist pf pt' admission   Hypokalemia - mild, will supplement today and repeat BMP in AM   Acute renal failure  - likely of pre renal etiology, will continue IVF - repeat BMP in AM   Bilateral hydronephrosis - still noted on Ct abdomen and pelvis - urology consultation obatined   DVT (deep venous thrombosis) 3/14 and PE - repeat US of the left lower extremity negative for acute DVT - continue Lovenox per pharmacy    Bacteremia MRSA 5/14 - repeat blood culture from 5/14 negative to date, blood culture obtained on this admission pending - will follow up on ID recommendations    Anemia of  chronic disease - Hg slightly down from admission but overall stable - CBC in AM  Consultants:  Urology  Procedures/Studies: Dg Chest 2 View  06/02/2012  No evidence of acute cardiopulmonary disease.   Ct Abdomen Pelvis W Contrast 06/02/2012    1.  Persistent bilateral hydronephrosis and hydroureter status post cystoprostatectomy.  No contrast excretion is demonstrated into the dilated right collecting system, concerning for persistent obstruction at the ureteral insertion at the ileoconduit.  Some contrast excretion is demonstrated into the left collecting system on the delayed images; the percutaneous nephrostomy and double-J left ureteral stent are unchanged, the latter with its distal loop in the distal ureter.   2.  Resolving postsurgical changes in the pelvis.  No new inflammatory changes or fluid collections.  3.  No evidence of bowel obstruction or acute process.    Antibiotics:  Vancomycin 06/03/2012 -->  Zosyn 06/03/2012 -->  Code Status: Full Family Communication: Pt at bedside Disposition Plan: Home when medically stable  HPI/Subjective: No events overnight.   Objective: Filed Vitals:   06/02/12 1848 06/02/12 2011 06/02/12 2115 06/03/12 0426  BP: 128/79 125/81 127/69 127/72  Pulse: 73 62 64 67  Temp: 99.3 F (37.4 C) 98.4 F (36.9 C) 98.1 F (36.7 C) 97.7 F (36.5 C)  TempSrc: Oral Oral  Oral  Resp: 20 18 18 16   Height:   5\' 11"  (1.803 m)   Weight:   103.6 kg (228 lb 6.3 oz)   SpO2: 96% 95% 99% 97%  Intake/Output Summary (Last 24 hours) at 06/03/12 1140 Last data filed at 06/03/12 1100  Gross per 24 hour  Intake    840 ml  Output   5375 ml  Net  -4535 ml    Exam:   General:  Pt is alert, follows commands appropriately, not in acute distress  Cardiovascular: Regular rate and rhythm, S1/S2, no murmurs, no rubs, no gallops  Respiratory: Clear to auscultation bilaterally, no wheezing, no crackles, no rhonchi  Abdomen: Soft, non tender, non distended,  bowel sounds present, no guarding  Extremities: No edema, pulses DP and PT palpable bilaterally  Neuro: Grossly nonfocal  Data Reviewed: Basic Metabolic Panel:  Recent Labs Lab 06/02/12 1604 06/03/12 0600  NA 145 141  K 3.5 3.0*  CL 106 105  CO2 27 27  GLUCOSE 108* 100*  BUN 13 11  CREATININE 1.45* 1.38*  CALCIUM 9.6 8.8   Liver Function Tests:  Recent Labs Lab 06/02/12 1604  AST 15  ALT 11  ALKPHOS 69  BILITOT 0.6  PROT 8.0  ALBUMIN 3.3*   CBC:  Recent Labs Lab 06/02/12 1604 06/03/12 0600  WBC 9.5 6.6  NEUTROABS 7.0  --   HGB 13.7 10.8*  HCT 43.2 34.9*  MCV 91.7 91.6  PLT 200 199    Recent Results (from the past 240 hour(s))  CULTURE, BLOOD (ROUTINE X 2)     Status: None   Collection Time    06/02/12  4:04 PM      Result Value Range Status   Specimen Description BLOOD LEFT HAND   Final   Special Requests BOTTLES DRAWN AEROBIC ONLY 2CC   Final   Culture  Setup Time 06/02/2012 21:12   Final   Culture     Final   Value:        BLOOD CULTURE RECEIVED NO GROWTH TO DATE CULTURE WILL BE HELD FOR 5 DAYS BEFORE ISSUING A FINAL NEGATIVE REPORT   Report Status PENDING   Incomplete  CULTURE, BLOOD (ROUTINE X 2)     Status: None   Collection Time    06/02/12  4:25 PM      Result Value Range Status   Specimen Description BLOOD LEFT HAND  2 ML IN [facility] BOTTLE   Final   Special Requests NONE   Final   Culture  Setup Time 06/02/2012 21:13   Final   Culture     Final   Value:        BLOOD CULTURE RECEIVED NO GROWTH TO DATE CULTURE WILL BE HELD FOR 5 DAYS BEFORE ISSUING A FINAL NEGATIVE REPORT   Report Status PENDING   Incomplete     Scheduled Meds: . enoxaparin  105 mg Subcutaneous BID  . multivitamin  1 tablet Oral Daily  . pantoprazole  40 mg Oral Daily  . ZOSYN IV  3.375 g Intravenous Q8H  . vancomycin  1250 mg IV  1,250 mg Intravenous Q12H   Continuous Infusions:    Debbora Presto, MD  [facility] Pager [phone]  If 7PM-7AM, please contact  night-coverage [email or web address] Password [email or web address] 06/03/2012, 11:40 AM   LOS: 1 day
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
## 2012-06-03 NOTE — Care Management Note (Signed)
Cm spoke with patient concerning discharge planning with spouse present at bedside. Per pt recently from home with IV ABX, services provided by [facility]. No other needs identified at this time. Pt has PICC, home previously with IV VANC. [facility] on-site liason following.   Roxy Manns Zalyn Amend,RN,BSN [phone]

## 2012-06-03 NOTE — Progress Notes (Signed)
ANTIBIOTIC CONSULT NOTE - INITIAL  Pharmacy Consult for zosyn Indication: Suspected infection   No Known Allergies  Patient Measurements: Height: 5\' 11"  (180.3 cm) Weight: 228 lb 6.3 oz (103.6 kg) IBW/kg (Calculated) : 75.3 Adjusted Body Weight:   Vital Signs: Temp: 98.1 F (36.7 C) (06/01 2115) Temp src: Oral (06/01 2011) BP: 127/69 mmHg (06/01 2115) Pulse Rate: 64 (06/01 2115) Intake/Output from previous day: 06/01 0701 - 06/02 0700 In: -  Out: 1675 [Urine:1675] Intake/Output from this shift: Total I/O In: -  Out: 775 [Urine:775]  Labs:  Recent Labs  06/02/12 1604  WBC 9.5  HGB 13.7  PLT 200  CREATININE 1.45*   Estimated Creatinine Clearance: 61.4 ml/min (by C-G formula based on Cr of 1.45). No results found for this basename: VANCOTROUGH, VANCOPEAK, VANCORANDOM, GENTTROUGH, GENTPEAK, GENTRANDOM, TOBRATROUGH, TOBRAPEAK, TOBRARND, AMIKACINPEAK, AMIKACINTROU, AMIKACIN,  in the last 72 hours   Microbiology: Recent Results (from the past 720 hour(s))  CULTURE, BLOOD (ROUTINE X 2)     Status: None   Collection Time    05/13/12  6:40 PM      Result Value Range Status   Specimen Description BLOOD LEFT ARM   Final   Special Requests BOTTLES DRAWN AEROBIC AND ANAEROBIC 4CC   Final   Culture  Setup Time 05/13/2012 23:52   Final   Culture     Final   Value: METHICILLIN RESISTANT STAPHYLOCOCCUS AUREUS     Note: RIFAMPIN AND GENTAMICIN SHOULD NOT BE USED AS SINGLE DRUGS FOR TREATMENT OF STAPH INFECTIONS. CRITICAL RESULT CALLED TO, READ BACK BY AND VERIFIED WITH: VASHONDA F @ [phone] 05/16/12 BY KRAWS     Note: Gram Stain Report Called to,Read Back By and Verified With: GRACE SISON 05/14/12 1525 BY SMITHERSJ   Report Status 05/16/2012 FINAL   Final   Organism ID, Bacteria METHICILLIN RESISTANT STAPHYLOCOCCUS AUREUS   Final  URINE CULTURE     Status: None   Collection Time    05/13/12  6:42 PM      Result Value Range Status   Specimen Description URINE, SUPRAPUBIC   Final   Special Requests NONE   Final   Culture  Setup Time 05/14/2012 02:09   Final   Colony Count >=100,000 COLONIES/ML   Final   Culture SERRATIA LIQUEFACIENS   Final   Report Status 05/15/2012 FINAL   Final   Organism ID, Bacteria SERRATIA LIQUEFACIENS   Final  CULTURE, BLOOD (ROUTINE X 2)     Status: None   Collection Time    05/13/12  6:50 PM      Result Value Range Status   Specimen Description BLOOD RIGHT ARM   Final   Special Requests BOTTLES DRAWN AEROBIC AND ANAEROBIC 2CC   Final   Culture  Setup Time 05/13/2012 23:52   Final   Culture     Final   Value: STAPHYLOCOCCUS AUREUS     Note: SUSCEPTIBILITIES PERFORMED ON PREVIOUS CULTURE WITHIN THE LAST 5 DAYS.     Note: Gram Stain Report Called to,Read Back By and Verified With: MONICA CLAYTON 05/15/12 AT 0210 RIDK   Report Status 05/16/2012 FINAL   Final  MRSA PCR SCREENING     Status: Abnormal   Collection Time    05/14/12 12:41 AM      Result Value Range Status   MRSA by PCR POSITIVE (*) NEGATIVE Final   Comment:            The GeneXpert MRSA Assay (FDA  approved for NASAL specimens     only), is one component of a     comprehensive MRSA colonization     surveillance program. It is not     intended to diagnose MRSA     infection nor to guide or     monitor treatment for     MRSA infections.     RESULT CALLED TO, READ BACK BY AND VERIFIED WITH:     L. HARRIS RN AT 0310 ON 05.13.14 BY SHUEA  CULTURE, BLOOD (ROUTINE X 2)     Status: None   Collection Time    05/15/12  1:57 PM      Result Value Range Status   Specimen Description BLOOD LEFT HAND   Final   Special Requests BOTTLES DRAWN AEROBIC ONLY 3CC   Final   Culture  Setup Time 05/15/2012 17:05   Final   Culture NO GROWTH 5 DAYS   Final   Report Status 05/21/2012 FINAL   Final  CULTURE, BLOOD (ROUTINE X 2)     Status: None   Collection Time    05/15/12  2:05 PM      Result Value Range Status   Specimen Description BLOOD RIGHT ARM   Final   Special Requests BOTTLES  DRAWN AEROBIC AND ANAEROBIC 5CC   Final   Culture  Setup Time 05/15/2012 17:05   Final   Culture NO GROWTH 5 DAYS   Final   Report Status 05/21/2012 FINAL   Final    Medical History: Past Medical History  Diagnosis Date  . Hypertension   . Hyperlipemia   . Impaired hearing BILATERAL AIDS  . History of concussion 1992    HIT IN HEAD BY [employer]-- NO RESIDUAL  . Acid reflux WATCHES DIET  . Diet-controlled type 2 diabetes mellitus   . Arthritis   . Hemorrhoids   . Carcinoma in situ of bladder RECURRENT    UROLOGIST- DR Retta Diones AND ONCOLOGIST- DR Clelia Croft  . Bilateral leg pain   . History of basal cell carcinoma excision BASE OF LEFT EAR  . Erythrocytosis LABS STABLE  . Cancer Feb 2014    bladder c    Medications:  Anti-infectives   Start     Dose/Rate Route Frequency Ordered Stop   06/02/12 2230  piperacillin-tazobactam (ZOSYN) IVPB 3.375 g     3.375 g 12.5 mL/hr over 240 Minutes Intravenous 3 times per day 06/02/12 2228     06/02/12 2200  vancomycin (VANCOCIN) 1,250 mg in sodium chloride 0.9 % 250 mL IVPB     1,250 mg 166.7 mL/hr over 90 Minutes Intravenous Every 12 hours 06/02/12 2146     06/02/12 2015  vancomycin (VANCOCIN) IVPB 1000 mg/200 mL premix     1,000 mg 200 mL/hr over 60 Minutes Intravenous  Once 06/02/12 2000 06/02/12 2118   06/02/12 1600  piperacillin-tazobactam (ZOSYN) IVPB 3.375 g     3.375 g 100 mL/hr over 30 Minutes Intravenous  Once 06/02/12 1522 06/02/12 1835     Assessment: Patient on vancomycin PTA for MRSA infection.  MD wishes to add zosyn per pharmacy.  Goal of Therapy:  Zosyn based on renal function  Plan:  Zosyn 3.375g IV Q8H infused over 4hrs.   Darlina Guys, Jacquenette Shone Crowford 06/03/2012,2:41 AM

## 2012-06-03 NOTE — Progress Notes (Signed)
[facility]  Patient Status: Active (receiving services up to time of hospitalization)  [facility] is providing the following services: RN and Home Infusion Services (teaching and education will be done by nurse in the home with patient and caregiver)  If patient discharges after hours, please call [phone].   Chad Olson 06/03/2012, 10:47 AM

## 2012-06-03 NOTE — Progress Notes (Signed)
Nutrition Brief Note  Patient identified on the Malnutrition Screening Tool (MST) Report  Body mass index is 31.87 kg/(m^2). Patient meets criteria for class I obesity based on current BMI.   Current diet order is regular, patient is consuming approximately 100% of meals at this time. Labs and medications reviewed. Potassium slightly low, getting replaced. Met with pt who reports on/off nausea PTA but typically eats 3 meals/day. He says he lost 55 pounds from February to May of this year but has gained back 12 pounds. He was supplementing his diet with Glucerna when his appetite was down but he has been eating excellent recently.   No nutrition interventions warranted at this time. If nutrition issues arise, please consult RD.   Levon Hedger MS, RD, LDN [phone] Pager [phone] After Hours Pager

## 2012-06-03 NOTE — Progress Notes (Signed)
ANTIBIOTIC CONSULT NOTE - INITIAL  Pharmacy Consult for vancomycin Indication: MRSA infection  No Known Allergies  Patient Measurements: Height: 5\' 11"  (180.3 cm) Weight: 228 lb 6.3 oz (103.6 kg) IBW/kg (Calculated) : 75.3 Adjusted Body Weight:   Vital Signs: Temp: 98.8 F (37.1 C) (06/02 2103) Temp src: Oral (06/02 2103) BP: 131/72 mmHg (06/02 2103) Pulse Rate: 85 (06/02 2103) Intake/Output from previous day: 06/01 0701 - 06/02 0700 In: 600 [P.O.:600] Out: 4375 [Urine:4375] Intake/Output from this shift: Total I/O In: 220 [P.O.:220] Out: -   Labs:  Recent Labs  06/02/12 1604 06/03/12 0600  WBC 9.5 6.6  HGB 13.7 10.8*  PLT 200 199  CREATININE 1.45* 1.38*   Estimated Creatinine Clearance: 64.5 ml/min (by C-G formula based on Cr of 1.38).  Recent Labs  06/03/12 2135  VANCOTROUGH 23.9*     Microbiology: Recent Results (from the past 720 hour(s))  CULTURE, BLOOD (ROUTINE X 2)     Status: None   Collection Time    05/13/12  6:40 PM      Result Value Range Status   Specimen Description BLOOD LEFT ARM   Final   Special Requests BOTTLES DRAWN AEROBIC AND ANAEROBIC 4CC   Final   Culture  Setup Time 05/13/2012 23:52   Final   Culture     Final   Value: METHICILLIN RESISTANT STAPHYLOCOCCUS AUREUS     Note: RIFAMPIN AND GENTAMICIN SHOULD NOT BE USED AS SINGLE DRUGS FOR TREATMENT OF STAPH INFECTIONS. CRITICAL RESULT CALLED TO, READ BACK BY AND VERIFIED WITH: VASHONDA F @ [phone] 05/16/12 BY KRAWS     Note: Gram Stain Report Called to,Read Back By and Verified With: GRACE SISON 05/14/12 1525 BY SMITHERSJ   Report Status 05/16/2012 FINAL   Final   Organism ID, Bacteria METHICILLIN RESISTANT STAPHYLOCOCCUS AUREUS   Final  URINE CULTURE     Status: None   Collection Time    05/13/12  6:42 PM      Result Value Range Status   Specimen Description URINE, SUPRAPUBIC   Final   Special Requests NONE   Final   Culture  Setup Time 05/14/2012 02:09   Final   Colony Count  >=100,000 COLONIES/ML   Final   Culture SERRATIA LIQUEFACIENS   Final   Report Status 05/15/2012 FINAL   Final   Organism ID, Bacteria SERRATIA LIQUEFACIENS   Final  CULTURE, BLOOD (ROUTINE X 2)     Status: None   Collection Time    05/13/12  6:50 PM      Result Value Range Status   Specimen Description BLOOD RIGHT ARM   Final   Special Requests BOTTLES DRAWN AEROBIC AND ANAEROBIC 2CC   Final   Culture  Setup Time 05/13/2012 23:52   Final   Culture     Final   Value: STAPHYLOCOCCUS AUREUS     Note: SUSCEPTIBILITIES PERFORMED ON PREVIOUS CULTURE WITHIN THE LAST 5 DAYS.     Note: Gram Stain Report Called to,Read Back By and Verified With: MONICA CLAYTON 05/15/12 AT 0210 RIDK   Report Status 05/16/2012 FINAL   Final  MRSA PCR SCREENING     Status: Abnormal   Collection Time    05/14/12 12:41 AM      Result Value Range Status   MRSA by PCR POSITIVE (*) NEGATIVE Final   Comment:            The GeneXpert MRSA Assay (FDA     approved for NASAL specimens  only), is one component of a     comprehensive MRSA colonization     surveillance program. It is not     intended to diagnose MRSA     infection nor to guide or     monitor treatment for     MRSA infections.     RESULT CALLED TO, READ BACK BY AND VERIFIED WITH:     L. HARRIS RN AT 0310 ON 05.13.14 BY SHUEA  CULTURE, BLOOD (ROUTINE X 2)     Status: None   Collection Time    05/15/12  1:57 PM      Result Value Range Status   Specimen Description BLOOD LEFT HAND   Final   Special Requests BOTTLES DRAWN AEROBIC ONLY 3CC   Final   Culture  Setup Time 05/15/2012 17:05   Final   Culture NO GROWTH 5 DAYS   Final   Report Status 05/21/2012 FINAL   Final  CULTURE, BLOOD (ROUTINE X 2)     Status: None   Collection Time    05/15/12  2:05 PM      Result Value Range Status   Specimen Description BLOOD RIGHT ARM   Final   Special Requests BOTTLES DRAWN AEROBIC AND ANAEROBIC 5CC   Final   Culture  Setup Time 05/15/2012 17:05   Final    Culture NO GROWTH 5 DAYS   Final   Report Status 05/21/2012 FINAL   Final  CULTURE, BLOOD (ROUTINE X 2)     Status: None   Collection Time    06/02/12  4:04 PM      Result Value Range Status   Specimen Description BLOOD LEFT HAND   Final   Special Requests BOTTLES DRAWN AEROBIC ONLY 2CC   Final   Culture  Setup Time 06/02/2012 21:12   Final   Culture     Final   Value:        BLOOD CULTURE RECEIVED NO GROWTH TO DATE CULTURE WILL BE HELD FOR 5 DAYS BEFORE ISSUING A FINAL NEGATIVE REPORT   Report Status PENDING   Incomplete  CULTURE, BLOOD (ROUTINE X 2)     Status: None   Collection Time    06/02/12  4:25 PM      Result Value Range Status   Specimen Description BLOOD LEFT HAND  2 ML IN [facility] BOTTLE   Final   Special Requests NONE   Final   Culture  Setup Time 06/02/2012 21:13   Final   Culture     Final   Value:        BLOOD CULTURE RECEIVED NO GROWTH TO DATE CULTURE WILL BE HELD FOR 5 DAYS BEFORE ISSUING A FINAL NEGATIVE REPORT   Report Status PENDING   Incomplete    Medical History: Past Medical History  Diagnosis Date  . Hypertension   . Hyperlipemia   . Impaired hearing BILATERAL AIDS  . History of concussion 1992    HIT IN HEAD BY [employer]-- NO RESIDUAL  . Acid reflux WATCHES DIET  . Diet-controlled type 2 diabetes mellitus   . Arthritis   . Hemorrhoids   . Carcinoma in situ of bladder RECURRENT    UROLOGIST- DR Retta Diones AND ONCOLOGIST- DR Clelia Croft  . Bilateral leg pain   . History of basal cell carcinoma excision BASE OF LEFT EAR  . Erythrocytosis LABS STABLE  . Cancer Feb 2014    bladder c    Medications:  Anti-infectives   Start  Dose/Rate Route Frequency Ordered Stop   06/02/12 2230  piperacillin-tazobactam (ZOSYN) IVPB 3.375 g  Status:  Discontinued     3.375 g 12.5 mL/hr over 240 Minutes Intravenous 3 times per day 06/02/12 2228 06/03/12 1541   06/02/12 2200  vancomycin (VANCOCIN) 1,250 mg in sodium chloride 0.9 % 250 mL IVPB  Status:  Discontinued      1,250 mg 166.7 mL/hr over 90 Minutes Intravenous Every 12 hours 06/02/12 2146 06/03/12 2254   06/02/12 2015  vancomycin (VANCOCIN) IVPB 1000 mg/200 mL premix     1,000 mg 200 mL/hr over 60 Minutes Intravenous  Once 06/02/12 2000 06/02/12 2118   06/02/12 1600  piperacillin-tazobactam (ZOSYN) IVPB 3.375 g     3.375 g 100 mL/hr over 30 Minutes Intravenous  Once 06/02/12 1522 06/02/12 1835     Assessment: Patient with vancomycin PTA.  Home dose continued.  VT high, 90% infused on last dose before stopping infusion.    Goal of Therapy:  Vancomycin trough level 15-20 mcg/ml  Plan:  Measure antibiotic drug levels at steady state Follow up culture results Change dose to 1gm iv vancomycin q12hr  Aleene Davidson Crowford 06/03/2012,11:30 PM

## 2012-06-03 NOTE — Consult Note (Signed)
Consult:  Hydronephrosis, fever  History of Present Illness:  Chad Olson is well known to our service following radical cystoprostatectomy. He has had some is with bacteremia, possible stenosis of the right ureteroileal anastomosis, possible compromise of the left ureteroileal anastomosis. Dr. Berneice Heinrich is planning on open revision of the ileal anastomoses when the patient is more stable.  Chad Olson has had some low-grade fever and overall general malaise for the past few days. He has not had nausea or vomiting but has had some nausea.He has been staying well hydrated ad has had excellent urine output from the ileostomy. He also complains of some lower extremity edema.  Past Medical History  Diagnosis Date  . Hypertension   . Hyperlipemia   . Impaired hearing BILATERAL AIDS  . History of concussion 1992    HIT IN HEAD BY [employer]-- NO RESIDUAL  . Acid reflux WATCHES DIET  . Diet-controlled type 2 diabetes mellitus   . Arthritis   . Hemorrhoids   . Carcinoma in situ of bladder RECURRENT    UROLOGIST- DR Retta Diones AND ONCOLOGIST- DR Clelia Croft  . Bilateral leg pain   . History of basal cell carcinoma excision BASE OF LEFT EAR  . Erythrocytosis LABS STABLE  . Cancer Feb 2014    bladder c   Past Surgical History  Procedure Laterality Date  . Transurethral resection of bladder tumor  01-11-2010    W/  BLADDER DIVERTICULUM REMOVAL  . Knee arthroscopy  2009    LEFT  . Total knee arthroplasty  2009    LEFT  . Cystoscopy with biopsy  01/09/2011    Procedure: CYSTOSCOPY WITH BIOPSY;  Surgeon: Marcine Matar, MD;  Location: [facility];  Service: Urology;  Laterality: N/A;  . Cystoscopy with biopsy  07/12/2011    Procedure: CYSTOSCOPY WITH BIOPSY;  Surgeon: Marcine Matar, MD;  Location: [facility];  Service: Urology;  Laterality: N/A;  with bladder biopsy   . Cystoscopy w/ retrogrades  07/12/2011    Procedure: CYSTOSCOPY WITH RETROGRADE PYELOGRAM;   Surgeon: Marcine Matar, MD;  Location: [facility];  Service: Urology;  Laterality: Bilateral;  . Cystoscopy w/ retrogrades  12/21/2011    Procedure: CYSTOSCOPY WITH RETROGRADE PYELOGRAM;  Surgeon: Marcine Matar, MD;  Location: [facility];  Service: Urology;  Laterality: Bilateral;     . Cystoscopy with biopsy  12/21/2011    Procedure: CYSTOSCOPY WITH BIOPSY;  Surgeon: Marcine Matar, MD;  Location: [facility];  Service: Urology;  Laterality: N/A;  . Robot assisted laparoscopic complete cystect ileal conduit N/A 02/22/2012    Procedure: ROBOTIC CYSTOPROSTATECTOMY, ILEAL CONDUIT,BILATERAL PELVIC  LYMPH NODE DISSECTION ;  Surgeon: Sebastian Ache, MD;  Location: [facility];  Service: Urology;  Laterality: N/A;  ROBOTIC CYSTOPROSTATECTOMY, ILEAL CONDUIT,BILATERAL PELVIC  LYMPH NODE DISSECTION   . Lymphadenectomy Bilateral 02/22/2012    Procedure: LYMPHADENECTOMY;  Surgeon: Sebastian Ache, MD;  Location: [facility];  Service: Urology;  Laterality: Bilateral;  . Cystoscopy N/A 02/22/2012    Procedure: CYSTOSCOPY;  Surgeon: Sebastian Ache, MD;  Location: [facility];  Service: Urology;  Laterality: N/A;    Home Medications:  Prescriptions prior to admission  Medication Sig Dispense Refill  . enoxaparin (LOVENOX) 120 MG/0.8ML injection Inject 105 mg into the skin 2 (two) times daily. Pt's doing 0.85ml      . Multiple Vitamin (MULTIVITAMIN WITH MINERALS) TABS Take 1 tablet by mouth daily.      . pantoprazole (PROTONIX) 40 MG tablet Take  40 mg by mouth daily.      . pravastatin (PRAVACHOL) 20 MG tablet Take 20 mg by mouth every morning.       . sodium chloride 0.9 % SOLN 250 mL with vancomycin 10 G SOLR 1,250 mg Inject 1,250 mg into the vein every 12 (twelve) hours. Continue through June 25, 2012. Directions per Infectious Disease MD.  1 each  0   Allergies: No Known Allergies  History reviewed. No pertinent family history. Social History:  reports that he  has never smoked. He has never used smokeless tobacco. He reports that he does not drink alcohol or use illicit drugs.  ROS: A complete review of systems was performed.  All systems are negative except for pertinent findings as noted. @ROS @   Physical Exam:  Vital signs in last 24 hours: Temp:  [97.7 F (36.5 C)-99.3 F (37.4 C)] 98 F (36.7 C) (06/02 1500) Pulse Rate:  [62-98] 98 (06/02 1500) Resp:  [16-20] 20 (06/02 1500) BP: (113-128)/(68-91) 123/91 mmHg (06/02 1500) SpO2:  [95 %-99 %] 98 % (06/02 1500) Weight:  [103.6 kg (228 lb 6.3 oz)] 103.6 kg (228 lb 6.3 oz) (06/01 2115) General:  Alert and oriented, No acute distress HEENT: Normocephalic, atraumatic Neck: No JVD or lymphadenopathy Cardiovascular: Regular rate and rhythm Lungs: Regular rate and effort Abdomen: Soft, nontender, nondistended, no abdominal masses, Right sided ileostomy pink and viable. Clear urine in bag. Back: No CVA tenderness Extremities: + edema Neurologic: Grossly intact  Laboratory Data:  Results for orders placed during the hospital encounter of 06/02/12 (from the past 24 hour(s))  CBC     Status: Abnormal   Collection Time    06/03/12  6:00 AM      Result Value Range   WBC 6.6  4.0 - 10.5 K/uL   RBC 3.81 (*) 4.22 - 5.81 MIL/uL   Hemoglobin 10.8 (*) 13.0 - 17.0 g/dL   HCT 16.1 (*) 09.6 - 04.5 %   MCV 91.6  78.0 - 100.0 fL   MCH 28.3  26.0 - 34.0 pg   MCHC 30.9  30.0 - 36.0 g/dL   RDW 40.9  81.1 - 91.4 %   Platelets 199  150 - 400 K/uL  BASIC METABOLIC PANEL     Status: Abnormal   Collection Time    06/03/12  6:00 AM      Result Value Range   Sodium 141  135 - 145 mEq/L   Potassium 3.0 (*) 3.5 - 5.1 mEq/L   Chloride 105  96 - 112 mEq/L   CO2 27  19 - 32 mEq/L   Glucose, Bld 100 (*) 70 - 99 mg/dL   BUN 11  6 - 23 mg/dL   Creatinine, Ser 7.82 (*) 0.50 - 1.35 mg/dL   Calcium 8.8  8.4 - 95.6 mg/dL   GFR calc non Af Amer 52 (*) >90 mL/min   GFR calc Af Amer 60 (*) >90 mL/min   Recent  Results (from the past 240 hour(s))  CULTURE, BLOOD (ROUTINE X 2)     Status: None   Collection Time    06/02/12  4:04 PM      Result Value Range Status   Specimen Description BLOOD LEFT HAND   Final   Special Requests BOTTLES DRAWN AEROBIC ONLY 2CC   Final   Culture  Setup Time 06/02/2012 21:12   Final   Culture     Final   Value:        BLOOD CULTURE  RECEIVED NO GROWTH TO DATE CULTURE WILL BE HELD FOR 5 DAYS BEFORE ISSUING A FINAL NEGATIVE REPORT   Report Status PENDING   Incomplete  CULTURE, BLOOD (ROUTINE X 2)     Status: None   Collection Time    06/02/12  4:25 PM      Result Value Range Status   Specimen Description BLOOD LEFT HAND  2 ML IN [facility] BOTTLE   Final   Special Requests NONE   Final   Culture  Setup Time 06/02/2012 21:13   Final   Culture     Final   Value:        BLOOD CULTURE RECEIVED NO GROWTH TO DATE CULTURE WILL BE HELD FOR 5 DAYS BEFORE ISSUING A FINAL NEGATIVE REPORT   Report Status PENDING   Incomplete   Creatinine:  Recent Labs  06/02/12 1604 06/03/12 0600  CREATININE 1.45* 1.38*   CT abdomen and pelvis - I reviewed all the images. It shows stable right hydroureteronephrosis, left ureteral stent, left upper ostomy. There were no abdominal fluid collections or any suspicious areas of abscess.  Impression/Assessment:  Patient Active Problem List   Diagnosis Date Noted  . VTE (venous thromboembolism) 05/16/2012  . Bacteremia MRSA 5/14 05/14/2012  . 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 UTI (lower urinary tract infection) 05/13/2012  . Bilateral hydronephrosis 05/13/2012  . DVT (deep venous thrombosis) 3/14 05/13/2012  . Fever 05/13/2012  . Bilateral renal cysts 03/25/2012  . Leukocytosis, unspecified 03/25/2012  . HTN (hypertension) 03/25/2012  . Acute pulmonary embolism (3/14) 03/25/2012  . Nausea alone 03/25/2012  . Bladder cancer 08/21/2011  . GERD 10/24/2007  . OTHER DYSPHAGIA 09/19/2007  . NONSPECIFIC ABNORMAL FIND RAD&OTH EXAM GI TRACT  09/19/2007     Plan:  From a urologic point of view he is stable apart from a slight bump in his creatinine. He is making good urine output from the ileostomy. CT scan is reassuring. He will be evaluated with TEE and I discussed the patient with Dr.Van Dam.  We would be hesitant to remove the left nephrostomy given the unknown location of the left ureteral stent. Depending on his cultures and how his creatinine responds we may leave the tubes in the left ureter and kidney alone, although they could be manipulated by IR if needed. We would also like to avoid placing a right nephrostomy unless clear indication.  I spoke to Dr. Berneice Heinrich about the patient and he will follow along with the patient starting tomorrow.  Antony Haste 06/03/2012, 5:52 PM

## 2012-06-04 DIAGNOSIS — A4902 Methicillin resistant Staphylococcus aureus infection, unspecified site: Secondary | ICD-10-CM

## 2012-06-04 DIAGNOSIS — R7881 Bacteremia: Secondary | ICD-10-CM

## 2012-06-04 LAB — CBC
MCH: 29.8 pg (ref 26.0–34.0)
MCHC: 32.3 g/dL (ref 30.0–36.0)
MCV: 92.2 fL (ref 78.0–100.0)
Platelets: 233 10*3/uL (ref 150–400)
RBC: 3.59 MIL/uL — ABNORMAL LOW (ref 4.22–5.81)
RDW: 15.4 % (ref 11.5–15.5)

## 2012-06-04 LAB — BASIC METABOLIC PANEL
CO2: 27 mEq/L (ref 19–32)
Calcium: 8.5 mg/dL (ref 8.4–10.5)
Creatinine, Ser: 1.33 mg/dL (ref 0.50–1.35)
GFR calc non Af Amer: 54 mL/min — ABNORMAL LOW (ref >90)
Sodium: 144 mEq/L (ref 135–145)

## 2012-06-04 LAB — URINE CULTURE
Colony Count: NO GROWTH
Culture: NO GROWTH

## 2012-06-04 LAB — SEDIMENTATION RATE: Sed Rate: 75 mm/hr — ABNORMAL HIGH (ref 0–16)

## 2012-06-04 MED ORDER — SODIUM CHLORIDE 0.9 % IJ SOLN
10.0000 mL | Freq: Two times a day (BID) | INTRAMUSCULAR | Status: DC
  Administered 2012-06-05: 10 mL

## 2012-06-04 MED ORDER — SODIUM CHLORIDE 0.9 % IJ SOLN
10.0000 mL | INTRAMUSCULAR | Status: DC | PRN
  Administered 2012-06-04 – 2012-06-06 (×2): 10 mL

## 2012-06-04 MED ORDER — POTASSIUM CHLORIDE CRYS ER 20 MEQ PO TBCR
20.0000 meq | EXTENDED_RELEASE_TABLET | Freq: Two times a day (BID) | ORAL | Status: AC
  Administered 2012-06-04 (×2): 20 meq via ORAL
  Filled 2012-06-04 (×2): qty 1

## 2012-06-04 NOTE — Progress Notes (Signed)
[facility] PROGRESS NOTE  DRAVYN SEVERS ZOX:[id number] DOB: March 07, 1945 DOA: 06/02/2012 PCP: [name], MD  Brief narrative: Chad Olson is an 67 y.o. male with a PMH of PE on chronic anticoagulation with Lovenox, radical cystoproctatectomy with ileal conduit and urinary diversion for refractory recurrent high-grade bladder cancer, s/p nephrostomy placement with recent (3 weeks ago) MRSA bacteremia, on home IV vancomycin via PICC for a planned treatment course of 6 weeks of antibiotics, and with plans for urology to replace/remove his current left nephrostomy after several weeks of abx. He had repeat cultures prior to his recent d/c which were negative. He presented to [facility] ED 06/02/12 with progressive generalized malaise, poor oral intake, subjective fevers, chills. No specific abdominal or urinary concerns. In ED, he was noted to have low grade fever of 99.5, Ct abdomen and pelvis with persistent bilateral hydronephrosis. [facility] asked to admit for further evaluation.    Assessment/Plan: Principal Problem:   Fever / H/O Bacteremia MRSA 5/14 -Low grade in the setting of h/o MRSA bacteremia in the setting of a chronic indwelling left nephrostomy tube and double-J stents. -PICC line placed on 05/17/2012 after clearance of blood cultures. -CT of the abdomen and pelvis done on admission shows persistent bilateral hydronephrosis and hydroureter status post cystoprostatectomy. No contrast excretion demonstrated into the dilated right collecting system, concerning for persistent obstruction at the ureteral insertion at the ileal conduit. The percutaneous nephrostomy and double-J left ureteral stents are unchanged. -Followup blood and urine cultures done on admission. -Initially placed on vancomycin and Zosyn, Zosyn subsequently discontinued by ID consultant. -Consider TEE. Consider discontinuing decline and culturing the tip. Consider discontinuation of nephrostomy tube and culturing it. Active  Problems:   Bladder cancer -Follows with Dr. Clelia Croft.   HTN (hypertension) -Blood pressure stable.   Nausea alone -Antinausea medications as needed.   Bilateral hydronephrosis status post left nephrostomy and left ureteral stent -Persistent on CT scans. -Urologist following.   DVT (deep venous thrombosis) 3/14 -Repeat ultrasound of the left lower extremity negative for acute DVT. -Continue Lovenox per pharmacy.   Hypokalemia -Potassium supplemented.   Acute renal failure -Thought to be prerenal in etiology. IV fluids ordered. Creatinine trending down.   Anemia of chronic disease -Hemoglobin stable. No current indication for transfusion.  Code Status: Full. Family Communication: Updated at bedside. Disposition Plan: Home when stable.   Medical Consultants:  Dr. Paulette Blanch Pearsall, ID  Dr. Jerilee Field, Urology.  Other Consultants:  Dietician  Anti-infectives: Vancomycin 05/12/2012---> Zosyn 06/02/2012---> 06/03/2012  HPI/Subjective: Chad Olson is feeling OK.  No nausea, vomiting, diarrhea, chills or significant pain.    Objective: Filed Vitals:   06/03/12 1412 06/03/12 1500 06/03/12 2103 06/04/12 0530  BP: 113/68 123/91 131/72 130/77  Pulse: 69 98 85 72  Temp: 97.9 F (36.6 C) 98 F (36.7 C) 98.8 F (37.1 C) 98.4 F (36.9 C)  TempSrc: Oral Oral Oral Oral  Resp: 18 20 18 16   Height:      Weight:      SpO2: 98% 98% 98% 100%    Intake/Output Summary (Last 24 hours) at 06/04/12 1311 Last data filed at 06/04/12 1100  Gross per 24 hour  Intake 2248.75 ml  Output   4501 ml  Net -2252.25 ml    Exam: Gen:  NAD Cardiovascular:  RRR, No M/R/G Respiratory:  Lungs CTAB Gastrointestinal:  Abdomen soft, NT/ND, + BS Extremities:  Trace edema  Data Reviewed: Basic Metabolic Panel:  Recent Labs Lab 06/02/12 1604 06/03/12 0600  06/04/12 0450  NA 145 141 144  K 3.5 3.0* 3.4*  CL 106 105 110  CO2 27 27 27   GLUCOSE 108* 100* 111*  BUN 13 11 10    CREATININE 1.45* 1.38* 1.33  CALCIUM 9.6 8.8 8.5   GFR Estimated Creatinine Clearance: 66.9 ml/min (by C-G formula based on Cr of 1.33). Liver Function Tests:  Recent Labs Lab 06/02/12 1604  AST 15  ALT 11  ALKPHOS 69  BILITOT 0.6  PROT 8.0  ALBUMIN 3.3*    CBC:  Recent Labs Lab 06/02/12 1604 06/03/12 0600 06/04/12 0450  WBC 9.5 6.6 6.7  NEUTROABS 7.0  --   --   HGB 13.7 10.8* 10.7*  HCT 43.2 34.9* 33.1*  MCV 91.7 91.6 92.2  PLT 200 199 233   Microbiology Recent Results (from the past 240 hour(s))  URINE CULTURE     Status: None   Collection Time    06/02/12  3:35 PM      Result Value Range Status   Specimen Description URINE, CLEAN CATCH   Final   Special Requests NONE   Final   Culture  Setup Time 06/03/2012 03:37   Final   Colony Count NO GROWTH   Final   Culture NO GROWTH   Final   Report Status 06/04/2012 FINAL   Final  CULTURE, BLOOD (ROUTINE X 2)     Status: None   Collection Time    06/02/12  4:04 PM      Result Value Range Status   Specimen Description BLOOD LEFT HAND   Final   Special Requests BOTTLES DRAWN AEROBIC ONLY 2CC   Final   Culture  Setup Time 06/02/2012 21:12   Final   Culture     Final   Value:        BLOOD CULTURE RECEIVED NO GROWTH TO DATE CULTURE WILL BE HELD FOR 5 DAYS BEFORE ISSUING A FINAL NEGATIVE REPORT   Report Status PENDING   Incomplete  CULTURE, BLOOD (ROUTINE X 2)     Status: None   Collection Time    06/02/12  4:25 PM      Result Value Range Status   Specimen Description BLOOD LEFT HAND  2 ML IN [facility] BOTTLE   Final   Special Requests NONE   Final   Culture  Setup Time 06/02/2012 21:13   Final   Culture     Final   Value:        BLOOD CULTURE RECEIVED NO GROWTH TO DATE CULTURE WILL BE HELD FOR 5 DAYS BEFORE ISSUING A FINAL NEGATIVE REPORT   Report Status PENDING   Incomplete     Procedures and Diagnostic Studies: Dg Chest 2 View  06/02/2012   *RADIOLOGY REPORT*  Clinical Data: Fever  CHEST - 2 VIEW  Comparison:  05/13/2012  Findings: Lungs are clear. No pleural effusion or pneumothorax.  Cardiomediastinal silhouette is within normal limits.  Right arm PICC terminates at the cavoatrial junction.  Mild degenerative changes of the visualized thoracolumbar spine.  IMPRESSION: No evidence of acute cardiopulmonary disease.   Original Report Authenticated By: Charline Bills, M.D.   Ct Abdomen Pelvis W Contrast  06/02/2012   *RADIOLOGY REPORT*  Clinical Data: Abdominal pelvic pain.  History of bladder cancer status post cystoprostatectomy 02/22/2012.  History of diabetes and hypertension.  CT ABDOMEN AND PELVIS WITH CONTRAST  Technique:  Multidetector CT imaging of the abdomen and pelvis was performed following the standard protocol during bolus administration of intravenous contrast.  Contrast: OMNIPAQUE IOHEXOL 300 MG/ML  SOLN  Comparison: Pelvic CT 05/15/2012.  Abdominal pelvic CT 05/13/2012.  Findings: The lung bases are clear.  There is no pleural or pericardial effusion.  A small hiatal hernia is noted.  The liver, gallbladder, spleen, pancreas and adrenal glands appear normal.  There is a percutaneous nephrostomy on the left.  A double-J left ureteral stent appears unchanged with the distal end of the stent in the distal ureter posterior to the ileal loop.  Mild dilatation of the left renal pelvis and ureter is unchanged.  Delayed images demonstrate some contrast excretion into the left renal pelvis. Small left renal cysts are stable.  On the right, there is persistent hydronephrosis with mildly heterogeneous renal parenchymal enhancement.  Delayed images demonstrate no excretion into the right collecting system.  Right renal cysts are stable.  The ileal loop of appears stable without definite adjacent fluid collection or surrounding inflammatory change.  Free pelvic fluid has nearly completely resolved.  There is no evidence of pelvic mass status post cystectomy and prostatectomy. Stool is present throughout the  colon.  There is no evidence of bowel obstruction.  There is no extravasated enteric contrast.  The appendix appears normal.  Lumbar spondylosis and bilateral L5 pars defects are noted.  No acute osseous findings are evident.  IMPRESSION:  1.  Persistent bilateral hydronephrosis and hydroureter status post cystoprostatectomy.  No contrast excretion is demonstrated into the dilated right collecting system, concerning for persistent obstruction at the ureteral insertion at the ileoconduit.  Some contrast excretion is demonstrated into the left collecting system on the delayed images; the percutaneous nephrostomy and double-J left ureteral stent are unchanged, the latter with its distal loop in the distal ureter.  2.  Resolving postsurgical changes in the pelvis.  No new inflammatory changes or fluid collections. 3.  No evidence of bowel obstruction or acute process.   Original Report Authenticated By: Carey Bullocks, M.D.   Left lower extremity venous duplex   06/03/2012  Impression: Negative for deep vein thrombosis. No evidence of left Baker's cyst.   Scheduled Meds: . enoxaparin  105 mg Subcutaneous BID  . multivitamin with minerals  1 tablet Oral Daily  . pantoprazole  40 mg Oral Daily  . sodium chloride  10-40 mL Intracatheter Q12H  . vancomycin  1,000 mg Intravenous BID   Continuous Infusions:   Time spent: 40 minutes with > 50% of time discussing diagnosis, treatment plan, answering questions from multiple family members.   LOS: 2 days   Kellsie Grindle  [facility] Pager [phone].  If 8PM-8AM, please contact night-coverage at [email or web address], password [facility] 06/04/2012, 1:11 PM

## 2012-06-04 NOTE — Progress Notes (Signed)
[facility]    Subjective: Family very concerned that nephrostomy tube could be infected, also concerned about timing of future surgeries   Antibiotics:  Anti-infectives   Start     Dose/Rate Route Frequency Ordered Stop   06/04/12 1000  vancomycin (VANCOCIN) IVPB 1000 mg/200 mL premix     1,000 mg 200 mL/hr over 60 Minutes Intravenous 2 times daily 06/03/12 2337     06/02/12 2230  piperacillin-tazobactam (ZOSYN) IVPB 3.375 g  Status:  Discontinued     3.375 g 12.5 mL/hr over 240 Minutes Intravenous 3 times per day 06/02/12 2228 06/03/12 1541   06/02/12 2200  vancomycin (VANCOCIN) 1,250 mg in sodium chloride 0.9 % 250 mL IVPB  Status:  Discontinued     1,250 mg 166.7 mL/hr over 90 Minutes Intravenous Every 12 hours 06/02/12 2146 06/03/12 2254   06/02/12 2015  vancomycin (VANCOCIN) IVPB 1000 mg/200 mL premix     1,000 mg 200 mL/hr over 60 Minutes Intravenous  Once 06/02/12 2000 06/02/12 2118   06/02/12 1600  piperacillin-tazobactam (ZOSYN) IVPB 3.375 g     3.375 g 100 mL/hr over 30 Minutes Intravenous  Once 06/02/12 1522 06/02/12 1835      Medications: Scheduled Meds: . enoxaparin  105 mg Subcutaneous BID  . multivitamin with minerals  1 tablet Oral Daily  . pantoprazole  40 mg Oral Daily  . potassium chloride  20 mEq Oral BID  . sodium chloride  10-40 mL Intracatheter Q12H  . vancomycin  1,000 mg Intravenous BID   Continuous Infusions:  PRN Meds:.alum & mag hydroxide-simeth, ondansetron (ZOFRAN) IV, ondansetron, sodium chloride   Objective: Weight change:   Intake/Output Summary (Last 24 hours) at 06/04/12 1700 Last data filed at 06/04/12 1338  Gross per 24 hour  Intake 2248.75 ml  Output   4801 ml  Net -2552.25 ml   Blood pressure 130/97, pulse 85, temperature 98.5 F (36.9 C), temperature source Oral, resp. rate 18, height 5\' 11"  (1.803 m), weight 228 lb 6.3 oz (103.6 kg), SpO2 99.00%. Temp:  [98.4 F (36.9 C)-98.8 F (37.1 C)]  98.5 F (36.9 C) (06/03 1337) Pulse Rate:  [72-85] 85 (06/03 1337) Resp:  [16-18] 18 (06/03 1337) BP: (130-131)/(72-97) 130/97 mmHg (06/03 1337) SpO2:  [98 %-100 %] 99 % (06/03 1337)  Physical Exam: HEENT: anicteric sclera, pupils reactive to light and accommodation, EOMI, oropharynx clear and without exudate  CVS regular rate, normal r, no murmur rubs or gallops  Chest: clear to auscultation bilaterally, no wheezing, rales or rhonchi  Abdomen: soft nontender, nondistended, normal bowel sounds, ileal conduit clean  Extremities: no clubbing or edema noted bilaterally  MSK: left knee with well healed surgical scar, no significant effusion and not warm. Left knee without effusion or warmth. Neither knee is tender.  Skin: no rashes. Nephrostomy tube insertion site is clean,  Neuro: nonfocal, strength and sensation intact   Lab Results:  Recent Labs  06/03/12 0600 06/04/12 0450  WBC 6.6 6.7  HGB 10.8* 10.7*  HCT 34.9* 33.1*  PLT 199 233    BMET  Recent Labs  06/03/12 0600 06/04/12 0450  NA 141 144  K 3.0* 3.4*  CL 105 110  CO2 27 27  GLUCOSE 100* 111*  BUN 11 10  CREATININE 1.38* 1.33  CALCIUM 8.8 8.5    Micro Results: Recent Results (from the past 240 hour(s))  URINE CULTURE     Status: None   Collection Time    06/02/12  3:35 PM      Result Value Range Status   Specimen Description URINE, CLEAN CATCH   Final   Special Requests NONE   Final   Culture  Setup Time 06/03/2012 03:37   Final   Colony Count NO GROWTH   Final   Culture NO GROWTH   Final   Report Status 06/04/2012 FINAL   Final  CULTURE, BLOOD (ROUTINE X 2)     Status: None   Collection Time    06/02/12  4:04 PM      Result Value Range Status   Specimen Description BLOOD LEFT HAND   Final   Special Requests BOTTLES DRAWN AEROBIC ONLY 2CC   Final   Culture  Setup Time 06/02/2012 21:12   Final   Culture     Final   Value:        BLOOD CULTURE RECEIVED NO GROWTH TO DATE CULTURE WILL BE HELD FOR 5  DAYS BEFORE ISSUING A FINAL NEGATIVE REPORT   Report Status PENDING   Incomplete  CULTURE, BLOOD (ROUTINE X 2)     Status: None   Collection Time    06/02/12  4:25 PM      Result Value Range Status   Specimen Description BLOOD LEFT HAND  2 ML IN [facility] BOTTLE   Final   Special Requests NONE   Final   Culture  Setup Time 06/02/2012 21:13   Final   Culture     Final   Value:        BLOOD CULTURE RECEIVED NO GROWTH TO DATE CULTURE WILL BE HELD FOR 5 DAYS BEFORE ISSUING A FINAL NEGATIVE REPORT   Report Status PENDING   Incomplete    Studies/Results: Ct Abdomen Pelvis W Contrast  06/02/2012   *RADIOLOGY REPORT*  Clinical Data: Abdominal pelvic pain.  History of bladder cancer status post cystoprostatectomy 02/22/2012.  History of diabetes and hypertension.  CT ABDOMEN AND PELVIS WITH CONTRAST  Technique:  Multidetector CT imaging of the abdomen and pelvis was performed following the standard protocol during bolus administration of intravenous contrast.  Contrast: OMNIPAQUE IOHEXOL 300 MG/ML  SOLN  Comparison: Pelvic CT 05/15/2012.  Abdominal pelvic CT 05/13/2012.  Findings: The lung bases are clear.  There is no pleural or pericardial effusion.  A small hiatal hernia is noted.  The liver, gallbladder, spleen, pancreas and adrenal glands appear normal.  There is a percutaneous nephrostomy on the left.  A double-J left ureteral stent appears unchanged with the distal end of the stent in the distal ureter posterior to the ileal loop.  Mild dilatation of the left renal pelvis and ureter is unchanged.  Delayed images demonstrate some contrast excretion into the left renal pelvis. Small left renal cysts are stable.  On the right, there is persistent hydronephrosis with mildly heterogeneous renal parenchymal enhancement.  Delayed images demonstrate no excretion into the right collecting system.  Right renal cysts are stable.  The ileal loop of appears stable without definite adjacent fluid collection or  surrounding inflammatory change.  Free pelvic fluid has nearly completely resolved.  There is no evidence of pelvic mass status post cystectomy and prostatectomy. Stool is present throughout the colon.  There is no evidence of bowel obstruction.  There is no extravasated enteric contrast.  The appendix appears normal.  Lumbar spondylosis and bilateral L5 pars defects are noted.  No acute osseous findings are evident.  IMPRESSION:  1.  Persistent bilateral hydronephrosis and hydroureter status post cystoprostatectomy.  No contrast  excretion is demonstrated into the dilated right collecting system, concerning for persistent obstruction at the ureteral insertion at the ileoconduit.  Some contrast excretion is demonstrated into the left collecting system on the delayed images; the percutaneous nephrostomy and double-J left ureteral stent are unchanged, the latter with its distal loop in the distal ureter.  2.  Resolving postsurgical changes in the pelvis.  No new inflammatory changes or fluid collections. 3.  No evidence of bowel obstruction or acute process.   Original Report Authenticated By: Carey Bullocks, M.D.      Assessment/Plan: Chad Olson is a 67 y.o. male with hx of bladder ca s/p cystectomy with ileal conduit urianry diversion for refractory recurrent high-grade bladder ca, urinoma/left uerteral leak/right hydronephrosis, now has right and now  left nephrostomy, requiring numerous urologic surgeries. She  presented with N/V, fevers and malaise which his work up showed right sided hydronephrosis where he was hospitalized from 5/12-5/16 for serratia pyelonephritis as well as staph aureus (MRSA) bacteremia now admitted with malaise, dec appeitite and low grade temperature     #1 FUO: Differential would include persistent or "metastatic MRSA infection" for example on heart valves, his prosthetic knee appears benign and clinically no pain or ss elsewhere to point to site taht needs to be worked up.  Certainly the nephrostomy tube on the left could be a source of persistent infection as well. It appears to have been placed on same date and around same time that urine was obtained for culture (possibly with placement of the right and left nephrostomy tubes) so the tubing could have become a nidus at that time.   --fu cultures  --I do not feel that the pts fevers at this time are such that they would make me feel we should go for TEE and anethesia involved  --if pt again begins to fever will DC PICC line and Culture tip  --I would certainly from an ID standpoint very strongly consider the possibility of this being a persistent nidus and consider removing it --iF possible and culturing the tip --  #2  MRSA bacteremia: will complete the originally planned 6 weeks from date of first negative cultures  #3 Screening: will check HIV RNA, and Hep C      LOS: 2 days   Acey Lav 06/04/2012, 5:00 PM

## 2012-06-04 NOTE — Progress Notes (Signed)
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 Left stent in place. Cr <1.5. Overall plan to perform open revision of anastamoses when safe to be off blood thinner x few days, and infectious parameters optimized.  2 - Malaise / Recurrent UTI - Pt on ABX per PICC for recent MRSA bacteremia, presumed from urinary primary. Had recurrent malaise on admit worrisome for repeat infections. Fortunately UCX, BCX have been negative and pt afebrile to day. Strength /energy level improving.  3 - Bladder Cancer - s/p cystectomy 02/2012 for refractory high-grade bladder cancer. Most recent imaging w/o overt recurrence.  Today Chad Olson is seen in f/u above. He is currently admitted to hospitalist team. LE Duplex now w/o Rt DVT.   Objective: Vital signs in last 24 hours: Temp:  [98.4 F (36.9 C)-98.8 F (37.1 C)] 98.5 F (36.9 C) (06/03 1337) Pulse Rate:  [72-85] 85 (06/03 1337) Resp:  [16-18] 18 (06/03 1337) BP: (130-131)/(72-97) 130/97 mmHg (06/03 1337) SpO2:  [98 %-100 %] 99 % (06/03 1337) Last BM Date: 06/02/12 (per pt)  Intake/Output from previous day: 06/02 0701 - 06/03 0700 In: 2128.8 [P.O.:920; I.V.:958.8; IV Piggyback:250] Out: 4350 [Urine:4350] Intake/Output this shift: Total I/O In: 600 [P.O.:600] Out: 2601 [Urine:2600; Stool:1]  General appearance: alert, cooperative, appears stated age and family at bedside Head: Normocephalic, without obvious abnormality, atraumatic Eyes: conjunctivae/corneas clear. PERRL, EOM's intact. Fundi benign. Ears: normal TM's and external ear canals both ears Nose: Nares normal. Septum midline. Mucosa normal. No drainage or sinus tenderness. Throat: lips, mucosa, and tongue normal; teeth and gums normal Neck: no adenopathy, no carotid bruit, no JVD, supple, symmetrical, trachea midline and thyroid not enlarged, symmetric, no tenderness/mass/nodules Back: symmetric,  no curvature. ROM normal. No CVA tenderness. Resp: clear to auscultation bilaterally Chest wall: no tenderness Cardio: regular rate and rhythm, S1, S2 normal, no murmur, click, rub or gallop GI: soft, non-tender; bowel sounds normal; no masses,  no organomegaly and RLQ urostomy pink / patent with yellow urine. No CVAT. Left neph tube capped and in place. Male genitalia: normal Extremities: extremities normal, atraumatic, no cyanosis or edema Pulses: 2+ and symmetric Skin: Skin color, texture, turgor normal. No rashes or lesions Lymph nodes: Cervical, supraclavicular, and axillary nodes normal. Neurologic: Alert and oriented X 3, normal strength and tone. Normal symmetric reflexes. Normal coordination and gait  Lab Results:   Recent Labs  06/03/12 0600 06/04/12 0450  WBC 6.6 6.7  HGB 10.8* 10.7*  HCT 34.9* 33.1*  PLT 199 233   BMET  Recent Labs  06/03/12 0600 06/04/12 0450  NA 141 144  K 3.0* 3.4*  CL 105 110  CO2 27 27  GLUCOSE 100* 111*  BUN 11 10  CREATININE 1.38* 1.33  CALCIUM 8.8 8.5   PT/INR No results found for this basename: LABPROT, INR,  in the last 72 hours ABG No results found for this basename: PHART, PCO2, PO2, HCO3,  in the last 72 hours  Studies/Results: No results found.  Anti-infectives: Anti-infectives   Start     Dose/Rate Route Frequency Ordered Stop   06/04/12 1000  vancomycin (VANCOCIN) IVPB 1000 mg/200 mL premix     1,000 mg 200 mL/hr over 60 Minutes Intravenous 2 times daily 06/03/12 2337     06/02/12 2230  piperacillin-tazobactam (ZOSYN) IVPB 3.375 g  Status:  Discontinued     3.375 g 12.5 mL/hr over 240 Minutes Intravenous 3 times per day 06/02/12 2228 06/03/12 1541  06/02/12 2200  vancomycin (VANCOCIN) 1,250 mg in sodium chloride 0.9 % 250 mL IVPB  Status:  Discontinued     1,250 mg 166.7 mL/hr over 90 Minutes Intravenous Every 12 hours 06/02/12 2146 06/03/12 2254   06/02/12 2015  vancomycin (VANCOCIN) IVPB 1000 mg/200 mL premix      1,000 mg 200 mL/hr over 60 Minutes Intravenous  Once 06/02/12 2000 06/02/12 2118   06/02/12 1600  piperacillin-tazobactam (ZOSYN) IVPB 3.375 g     3.375 g 100 mL/hr over 30 Minutes Intravenous  Once 06/02/12 1522 06/02/12 1835      Assessment/Plan:  1 - Bilateral Hydronephrosis - DVT now resolved. Most recent UCX / BCX negative. Will plan next stage surgery to occur likely in about 2 weeks. Explained admit day prior (to my service) for bowel prep in case further intestinal work required (low chance). Current stent / tubes to remain for now as "backup". I feel he is likely adequate for DC home from this admission when primary team feels acceptable.   2 - Malaise / Recurrent UTI -Agree with continued PICC / ABX based on most recent piror CX's as outlined by piror ID recs.  3 - Bladder Cancer - No evidence of active disease.  Will follow, please call anytime with questions.   [facility], Jamila Slatten 06/04/2012

## 2012-06-05 DIAGNOSIS — Z22322 Carrier or suspected carrier of Methicillin resistant Staphylococcus aureus: Secondary | ICD-10-CM

## 2012-06-05 LAB — HEPATITIS C ANTIBODY: HCV Ab: NEGATIVE

## 2012-06-05 LAB — HIV-1 RNA QUANT-NO REFLEX-BLD
HIV 1 RNA Quant: <20 copies/mL (ref <20)
HIV-1 RNA Quant, Log: <1.3 {Log} (ref <1.30)

## 2012-06-05 NOTE — Progress Notes (Signed)
[facility] PROGRESS NOTE  Chad Olson ZOX:[id number] DOB: [birth year] DOA: 06/02/2012 PCP: [name], MD  Brief narrative: Chad Olson is an 67 y.o. male with a PMH of PE on chronic anticoagulation with Lovenox, radical cystoproctatectomy with ileal conduit and urinary diversion for refractory recurrent high-grade bladder cancer, s/p nephrostomy placement with recent (3 weeks ago) MRSA bacteremia, on home IV vancomycin via PICC for a planned treatment course of 6 weeks of antibiotics, and with plans for urology to replace/remove his current left nephrostomy after several weeks of abx. He had repeat cultures prior to his recent d/c which were negative. He presented to [facility] ED 06/02/12 with progressive generalized malaise, poor oral intake, subjective fevers, chills. No specific abdominal or urinary concerns. In ED, he was noted to have low grade fever of 99.5, Ct abdomen and pelvis with persistent bilateral hydronephrosis. [facility] asked to admit for further evaluation.    Assessment/Plan: Principal Problem:   Fever / H/O Bacteremia MRSA 5/14 -Low grade in the setting of h/o MRSA bacteremia in the setting of a chronic indwelling left nephrostomy tube and double-J stents. -PICC line placed on 05/17/2012 after clearance of blood cultures. -CT of the abdomen and pelvis done on admission shows persistent bilateral hydronephrosis and hydroureter status post cystoprostatectomy. No contrast excretion demonstrated into the dilated right collecting system, concerning for persistent obstruction at the ureteral insertion at the ileal conduit. The percutaneous nephrostomy and double-J left ureteral stents are unchanged. -Followup blood and urine cultures done on admission. -Initially placed on vancomycin and Zosyn, Zosyn subsequently discontinued by ID consultant. -Afebrile since admission with no + blood/urine cultures to date. Active Problems:   Bladder cancer -Follows with Dr. Clelia Croft.   HTN  (hypertension) -Blood pressure stable.   Nausea alone -Antinausea medications as needed.   Bilateral hydronephrosis status post left nephrostomy and left ureteral stent -Persistent on CT scans. -Urologist following.   DVT (deep venous thrombosis) 3/14 -Repeat ultrasound of the left lower extremity negative for acute DVT. -Continue Lovenox per pharmacy.   Hypokalemia -Potassium supplemented.   Acute renal failure -Thought to be prerenal in etiology. IV fluids ordered. Creatinine trending down.   Anemia of chronic disease -Hemoglobin stable. No current indication for transfusion.  Code Status: Full. Family Communication: Updated at bedside. Disposition Plan: Home when stable.   Medical Consultants:  Dr. Paulette Blanch [place], ID  Dr. Jerilee Field, Urology.  Other Consultants:  Dietician  Anti-infectives: Vancomycin 05/12/2012---> Zosyn 06/02/2012---> 06/03/2012  HPI/Subjective: Chad Olson is feeling good.  His appetite has returned.  No pain, dyspnea or other symptoms.    Objective: Filed Vitals:   06/04/12 0530 06/04/12 1337 06/04/12 2100 06/05/12 0621  BP: 130/77 130/97 138/69 130/82  Pulse: 72 85 80 62  Temp: 98.4 F (36.9 C) 98.5 F (36.9 C) 97.8 F (36.6 C) 98.4 F (36.9 C)  TempSrc: Oral Oral Oral Oral  Resp: 16 18 20 16   Height:      Weight:      SpO2: 100% 99% 98% 98%    Intake/Output Summary (Last 24 hours) at 06/05/12 1402 Last data filed at 06/05/12 1216  Gross per 24 hour  Intake   2520 ml  Output   5850 ml  Net  -3330 ml    Exam: Gen:  NAD Cardiovascular:  RRR, No M/R/G Respiratory:  Lungs CTAB Gastrointestinal:  Abdomen soft, NT/ND, + BS Extremities:  Trace edema  Data Reviewed: Basic Metabolic Panel:  Recent Labs Lab 06/02/12 1604 06/03/12 0600 06/04/12  0450  NA 145 141 144  K 3.5 3.0* 3.4*  CL 106 105 110  CO2 27 27 27   GLUCOSE 108* 100* 111*  BUN 13 11 10   CREATININE 1.45* 1.38* 1.33  CALCIUM 9.6 8.8 8.5    GFR Estimated Creatinine Clearance: 66.9 ml/min (by C-G formula based on Cr of 1.33). Liver Function Tests:  Recent Labs Lab 06/02/12 1604  AST 15  ALT 11  ALKPHOS 69  BILITOT 0.6  PROT 8.0  ALBUMIN 3.3*    CBC:  Recent Labs Lab 06/02/12 1604 06/03/12 0600 06/04/12 0450  WBC 9.5 6.6 6.7  NEUTROABS 7.0  --   --   HGB 13.7 10.8* 10.7*  HCT 43.2 34.9* 33.1*  MCV 91.7 91.6 92.2  PLT 200 199 233   Microbiology Recent Results (from the past 240 hour(s))  URINE CULTURE     Status: None   Collection Time    06/02/12  3:35 PM      Result Value Range Status   Specimen Description URINE, CLEAN CATCH   Final   Special Requests NONE   Final   Culture  Setup Time 06/03/2012 03:37   Final   Colony Count NO GROWTH   Final   Culture NO GROWTH   Final   Report Status 06/04/2012 FINAL   Final  CULTURE, BLOOD (ROUTINE X 2)     Status: None   Collection Time    06/02/12  4:04 PM      Result Value Range Status   Specimen Description BLOOD LEFT HAND   Final   Special Requests BOTTLES DRAWN AEROBIC ONLY 2CC   Final   Culture  Setup Time 06/02/2012 21:12   Final   Culture     Final   Value:        BLOOD CULTURE RECEIVED NO GROWTH TO DATE CULTURE WILL BE HELD FOR 5 DAYS BEFORE ISSUING A FINAL NEGATIVE REPORT   Report Status PENDING   Incomplete  CULTURE, BLOOD (ROUTINE X 2)     Status: None   Collection Time    06/02/12  4:25 PM      Result Value Range Status   Specimen Description BLOOD LEFT HAND  2 ML IN [facility] BOTTLE   Final   Special Requests NONE   Final   Culture  Setup Time 06/02/2012 21:13   Final   Culture     Final   Value:        BLOOD CULTURE RECEIVED NO GROWTH TO DATE CULTURE WILL BE HELD FOR 5 DAYS BEFORE ISSUING A FINAL NEGATIVE REPORT   Report Status PENDING   Incomplete     Procedures and Diagnostic Studies: Dg Chest 2 View  06/02/2012   *RADIOLOGY REPORT*  Clinical Data: Fever  CHEST - 2 VIEW  Comparison: 05/13/2012  Findings: Lungs are clear. No pleural  effusion or pneumothorax.  Cardiomediastinal silhouette is within normal limits.  Right arm PICC terminates at the cavoatrial junction.  Mild degenerative changes of the visualized thoracolumbar spine.  IMPRESSION: No evidence of acute cardiopulmonary disease.   Original Report Authenticated By: Charline Bills, M.D.   Ct Abdomen Pelvis W Contrast  06/02/2012   *RADIOLOGY REPORT*  Clinical Data: Abdominal pelvic pain.  History of bladder cancer status post cystoprostatectomy 02/22/2012.  History of diabetes and hypertension.  CT ABDOMEN AND PELVIS WITH CONTRAST  Technique:  Multidetector CT imaging of the abdomen and pelvis was performed following the standard protocol during bolus administration of intravenous contrast.  Contrast:  OMNIPAQUE IOHEXOL 300 MG/ML  SOLN  Comparison: Pelvic CT 05/15/2012.  Abdominal pelvic CT 05/13/2012.  Findings: The lung bases are clear.  There is no pleural or pericardial effusion.  A small hiatal hernia is noted.  The liver, gallbladder, spleen, pancreas and adrenal glands appear normal.  There is a percutaneous nephrostomy on the left.  A double-J left ureteral stent appears unchanged with the distal end of the stent in the distal ureter posterior to the ileal loop.  Mild dilatation of the left renal pelvis and ureter is unchanged.  Delayed images demonstrate some contrast excretion into the left renal pelvis. Small left renal cysts are stable.  On the right, there is persistent hydronephrosis with mildly heterogeneous renal parenchymal enhancement.  Delayed images demonstrate no excretion into the right collecting system.  Right renal cysts are stable.  The ileal loop of appears stable without definite adjacent fluid collection or surrounding inflammatory change.  Free pelvic fluid has nearly completely resolved.  There is no evidence of pelvic mass status post cystectomy and prostatectomy. Stool is present throughout the colon.  There is no evidence of bowel obstruction.   There is no extravasated enteric contrast.  The appendix appears normal.  Lumbar spondylosis and bilateral L5 pars defects are noted.  No acute osseous findings are evident.  IMPRESSION:  1.  Persistent bilateral hydronephrosis and hydroureter status post cystoprostatectomy.  No contrast excretion is demonstrated into the dilated right collecting system, concerning for persistent obstruction at the ureteral insertion at the ileoconduit.  Some contrast excretion is demonstrated into the left collecting system on the delayed images; the percutaneous nephrostomy and double-J left ureteral stent are unchanged, the latter with its distal loop in the distal ureter.  2.  Resolving postsurgical changes in the pelvis.  No new inflammatory changes or fluid collections. 3.  No evidence of bowel obstruction or acute process.   Original Report Authenticated By: Carey Bullocks, M.D.   Left lower extremity venous duplex   06/03/2012  Impression: Negative for deep vein thrombosis. No evidence of left Baker's cyst.   Scheduled Meds: . enoxaparin  105 mg Subcutaneous BID  . multivitamin with minerals  1 tablet Oral Daily  . pantoprazole  40 mg Oral Daily  . sodium chloride  10-40 mL Intracatheter Q12H  . vancomycin  1,000 mg Intravenous BID   Continuous Infusions:   Time spent: 40 minutes with > 50% of time discussing diagnosis, treatment plan, answering questions from multiple family members.   LOS: 3 days   Teshia Mahone  [facility] Pager [phone].  If 8PM-8AM, please contact night-coverage at [email or web address], password [facility] 06/05/2012, 2:02 PM

## 2012-06-05 NOTE — Progress Notes (Signed)
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 Left stent in place. Cr <1.5. Overall plan to perform open revision of anastamoses when safe to be off blood thinner x few days, and infectious parameters optimized.  2 - Malaise / Recurrent UTI - Pt on ABX per PICC for recent MRSA bacteremia, presumed from urinary primary. Had recurrent malaise on admit worrisome for repeat infections. Fortunately UCX, BCX have been negative and pt remains afebrile.   3 - Bladder Cancer - s/p cystectomy 02/2012 for refractory high-grade bladder cancer. Most recent imaging w/o overt recurrence.  Today Chad Olson is seen in f/u above. His CX remain negative an plan to DC tomorrow. His appetite is much improved and he is feeling much stronger.  Objective: Vital signs in last 24 hours: Temp:  [97.8 F (36.6 C)-98.6 F (37 C)] 98.6 F (37 C) (06/04 1401) Pulse Rate:  [62-97] 97 (06/04 1401) Resp:  [16-20] 16 (06/04 1401) BP: (130-138)/(69-82) 132/81 mmHg (06/04 1401) SpO2:  [98 %] 98 % (06/04 1401) Last BM Date: 06/04/12  Intake/Output from previous day: 06/03 0701 - 06/04 0700 In: 2640 [P.O.:2240; IV Piggyback:400] Out: 2440 [Urine:6725; Stool:1] Intake/Output this shift: Total I/O In: 960 [P.O.:960] Out: 2525 [Urine:2525]  General appearance: alert, cooperative, appears stated age and Family at bedside Head: Normocephalic, without obvious abnormality, atraumatic Eyes: conjunctivae/corneas clear. PERRL, EOM's intact. Fundi benign. Ears: normal TM's and external ear canals both ears Nose: Nares normal. Septum midline. Mucosa normal. No drainage or sinus tenderness. Throat: lips, mucosa, and tongue normal; teeth and gums normal Neck: no adenopathy, no carotid bruit, no JVD, supple, symmetrical, trachea midline and thyroid not enlarged, symmetric, no tenderness/mass/nodules Back: symmetric, no curvature. ROM normal. No  CVA tenderness., Left neph tube capped, c/d/i. Resp: clear to auscultation bilaterally Cardio: regular rate and rhythm, S1, S2 normal, no murmur, click, rub or gallop GI: soft, non-tender; bowel sounds normal; no masses,  no organomegaly and RLQ urostomy pink / patent with clear urine. Male genitalia: normal Extremities: extremities normal, atraumatic, no cyanosis or edema Pulses: 2+ and symmetric Skin: Skin color, texture, turgor normal. No rashes or lesions Lymph nodes: Cervical, supraclavicular, and axillary nodes normal. Neurologic: Grossly normal  Lab Results:   Recent Labs  06/03/12 0600 06/04/12 0450  WBC 6.6 6.7  HGB 10.8* 10.7*  HCT 34.9* 33.1*  PLT 199 233   BMET  Recent Labs  06/03/12 0600 06/04/12 0450  NA 141 144  K 3.0* 3.4*  CL 105 110  CO2 27 27  GLUCOSE 100* 111*  BUN 11 10  CREATININE 1.38* 1.33  CALCIUM 8.8 8.5   PT/INR No results found for this basename: LABPROT, INR,  in the last 72 hours ABG No results found for this basename: PHART, PCO2, PO2, HCO3,  in the last 72 hours  Studies/Results: No results found.  Anti-infectives: Anti-infectives   Start     Dose/Rate Route Frequency Ordered Stop   06/04/12 1000  vancomycin (VANCOCIN) IVPB 1000 mg/200 mL premix     1,000 mg 200 mL/hr over 60 Minutes Intravenous 2 times daily 06/03/12 2337     06/02/12 2230  piperacillin-tazobactam (ZOSYN) IVPB 3.375 g  Status:  Discontinued     3.375 g 12.5 mL/hr over 240 Minutes Intravenous 3 times per day 06/02/12 2228 06/03/12 1541   06/02/12 2200  vancomycin (VANCOCIN) 1,250 mg in sodium chloride 0.9 % 250 mL IVPB  Status:  Discontinued  1,250 mg 166.7 mL/hr over 90 Minutes Intravenous Every 12 hours 06/02/12 2146 06/03/12 2254   06/02/12 2015  vancomycin (VANCOCIN) IVPB 1000 mg/200 mL premix     1,000 mg 200 mL/hr over 60 Minutes Intravenous  Once 06/02/12 2000 06/02/12 2118   06/02/12 1600  piperacillin-tazobactam (ZOSYN) IVPB 3.375 g     3.375  g 100 mL/hr over 30 Minutes Intravenous  Once 06/02/12 1522 06/02/12 1835      Assessment/Plan:  1 - Bilateral Hydronephrosis - DVT now resolved. Most recent UCX / BCX negative. Will plan next stage surgery to occur likely in about 2 weeks. Explained admit day prior (to my service) for bowel prep in case further intestinal work required (low chance). Current stent / tubes to remain for now as "backup". I feel he is likely adequate for DC home from this admission when primary team feels acceptable.   2 - Malaise / Recurrent UTI -Agree with continued PICC / ABX based on most recent piror CX's as outlined by piror ID recs. Re-enforced need for excellent nutrition peri-op next surgery and encouraged high protein diet in interval.  3 - Bladder Cancer - No evidence of active disease by most recent indications.  Will follow, please call anytime with questions.  [facility], [name] 06/05/2012

## 2012-06-05 NOTE — Progress Notes (Signed)
[facility]    Subjective: No new complaints   Antibiotics:  Anti-infectives   Start     Dose/Rate Route Frequency Ordered Stop   06/04/12 1000  vancomycin (VANCOCIN) IVPB 1000 mg/200 mL premix     1,000 mg 200 mL/hr over 60 Minutes Intravenous 2 times daily 06/03/12 2337     06/02/12 2230  piperacillin-tazobactam (ZOSYN) IVPB 3.375 g  Status:  Discontinued     3.375 g 12.5 mL/hr over 240 Minutes Intravenous 3 times per day 06/02/12 2228 06/03/12 1541   06/02/12 2200  vancomycin (VANCOCIN) 1,250 mg in sodium chloride 0.9 % 250 mL IVPB  Status:  Discontinued     1,250 mg 166.7 mL/hr over 90 Minutes Intravenous Every 12 hours 06/02/12 2146 06/03/12 2254   06/02/12 2015  vancomycin (VANCOCIN) IVPB 1000 mg/200 mL premix     1,000 mg 200 mL/hr over 60 Minutes Intravenous  Once 06/02/12 2000 06/02/12 2118   06/02/12 1600  piperacillin-tazobactam (ZOSYN) IVPB 3.375 g     3.375 g 100 mL/hr over 30 Minutes Intravenous  Once 06/02/12 1522 06/02/12 1835      Medications: Scheduled Meds: . enoxaparin  105 mg Subcutaneous BID  . multivitamin with minerals  1 tablet Oral Daily  . pantoprazole  40 mg Oral Daily  . sodium chloride  10-40 mL Intracatheter Q12H  . vancomycin  1,000 mg Intravenous BID   Continuous Infusions:  PRN Meds:.alum & mag hydroxide-simeth, ondansetron (ZOFRAN) IV, ondansetron, sodium chloride   Objective: Weight change:   Intake/Output Summary (Last 24 hours) at 06/05/12 1516 Last data filed at 06/05/12 1406  Gross per 24 hour  Intake   3000 ml  Output   6650 ml  Net  -3650 ml   Blood pressure 132/81, pulse 97, temperature 98.6 F (37 C), temperature source Oral, resp. rate 16, height 5\' 11"  (1.803 m), weight 228 lb 6.3 oz (103.6 kg), SpO2 98.00%. Temp:  [97.8 F (36.6 C)-98.6 F (37 C)] 98.6 F (37 C) (06/04 1401) Pulse Rate:  [62-97] 97 (06/04 1401) Resp:  [16-20] 16 (06/04 1401) BP: (130-138)/(69-82) 132/81 mmHg (06/04  1401) SpO2:  [98 %] 98 % (06/04 1401)  Physical Exam: HEENT: anicteric sclera, pupils reactive to light and accommodation, EOMI, oropharynx clear and without exudate  CVS regular rate, normal r, no murmur rubs or gallops  Chest: clear to auscultation bilaterally, no wheezing, rales or rhonchi  Abdomen: soft nontender, nondistended, normal bowel sounds, ileal conduit clean  Extremities: no clubbing or edema noted bilaterally  MSK: left knee with well healed surgical scar, no significant effusion and not warm. Left knee without effusion or warmth. Neither knee is tender.  Skin: no rashes. Nephrostomy tube insertion site is clean,  Neuro: nonfocal, strength and sensation intact   Lab Results:  Recent Labs  06/03/12 0600 06/04/12 0450  WBC 6.6 6.7  HGB 10.8* 10.7*  HCT 34.9* 33.1*  PLT 199 233    BMET  Recent Labs  06/03/12 0600 06/04/12 0450  NA 141 144  K 3.0* 3.4*  CL 105 110  CO2 27 27  GLUCOSE 100* 111*  BUN 11 10  CREATININE 1.38* 1.33  CALCIUM 8.8 8.5    Micro Results: Recent Results (from the past 240 hour(s))  URINE CULTURE     Status: None   Collection Time    06/02/12  3:35 PM      Result Value Range Status   Specimen Description URINE, CLEAN CATCH  Final   Special Requests NONE   Final   Culture  Setup Time 06/03/2012 03:37   Final   Colony Count NO GROWTH   Final   Culture NO GROWTH   Final   Report Status 06/04/2012 FINAL   Final  CULTURE, BLOOD (ROUTINE X 2)     Status: None   Collection Time    06/02/12  4:04 PM      Result Value Range Status   Specimen Description BLOOD LEFT HAND   Final   Special Requests BOTTLES DRAWN AEROBIC ONLY 2CC   Final   Culture  Setup Time 06/02/2012 21:12   Final   Culture     Final   Value:        BLOOD CULTURE RECEIVED NO GROWTH TO DATE CULTURE WILL BE HELD FOR 5 DAYS BEFORE ISSUING A FINAL NEGATIVE REPORT   Report Status PENDING   Incomplete  CULTURE, BLOOD (ROUTINE X 2)     Status: None   Collection Time     06/02/12  4:25 PM      Result Value Range Status   Specimen Description BLOOD LEFT HAND  2 ML IN [facility] BOTTLE   Final   Special Requests NONE   Final   Culture  Setup Time 06/02/2012 21:13   Final   Culture     Final   Value:        BLOOD CULTURE RECEIVED NO GROWTH TO DATE CULTURE WILL BE HELD FOR 5 DAYS BEFORE ISSUING A FINAL NEGATIVE REPORT   Report Status PENDING   Incomplete    Studies/Results: No results found.    Assessment/Plan: Chad Olson is a 67 y.o. male with hx of bladder ca s/p cystectomy with ileal conduit urianry diversion for refractory recurrent high-grade bladder ca, urinoma/left uerteral leak/right hydronephrosis, now has right and now  left nephrostomy, requiring numerous urologic surgeries. She  presented with N/V, fevers and malaise which his work up showed right sided hydronephrosis where he was hospitalized from 5/12-5/16 for serratia pyelonephritis as well as staph aureus (MRSA) bacteremia now admitted with malaise, dec appeitite and low grade temperature     #1 FUO: Differential would include persistent or "metastatic MRSA infection" for example on heart valves, his prosthetic knee appears benign and clinically no pain or ss elsewhere to point to site taht needs to be worked up. Certainly the nephrostomy tube on the left could be a source of persistent infection as well. It appears to have been placed on same date and around same time that urine was obtained for culture (possibly with placement of the right and left nephrostomy tubes) so the tubing could have become a nidus at that time.   --fu cultures  --I do not feel that the pts fevers at this time are such that they would make me feel we should go for TEE and anethesia involved  --if pt again begins to fever will DC PICC line and Culture tip  --I would certainly from an ID standpoint very strongly consider the possibility of this being a persistent nidus and consider removing it --iF possible and culturing  the tip but urology needs this in as backup prior to surgery --  #2  MRSA bacteremia: will complete the originally planned 6 weeks from date of first negative cultures ie thru June 24th,  I will then place the pt on Oral doxycycline thru his surgery and removal of nephrostomy tubes due to anxiety about nephrostomy tube being colonized, infected  #  3 MRSA infected, colonized:  --pt should have a one week decolonization regimen with hibiclens 4% baths nightly x 7 days and IN mupirocin 2% bid in 7 days prior to planned surgery  --FINALLY he SHOULD DEFINITELY RECEIVE IV VANCOMYCIN VS CUBICIN IN ADDITION TO OTHER ROUTINE PREOPERATIVE ABX PRIOR TO HIS SURGERY REGARDLESS OF MRSA PCR FROM HIS NOSTRILS   #3 Screening: will check HIV RNA, and Hep C IS negative   I will arrange HSFU in our clinic prior to DC of his IV abx   LOS: 3 days   Acey Lav 06/05/2012, 3:16 PM

## 2012-06-06 MED ORDER — HEPARIN SOD (PORK) LOCK FLUSH 100 UNIT/ML IV SOLN
250.0000 [IU] | INTRAVENOUS | Status: AC | PRN
  Administered 2012-06-06: 250 [IU]

## 2012-06-06 NOTE — Progress Notes (Signed)
Pt was active with [facility]. Spoke with pt's wife who stated that they will continue with [facility] for Home Health needs. In house rep with [facility] aware.

## 2012-06-06 NOTE — Progress Notes (Signed)
PICC line capped off, flushed with 10cc NS, GBR followed by Heparin 250 units. Chad Olson M  

## 2012-06-06 NOTE — Discharge Summary (Signed)
Physician Discharge Summary  Chad Olson ZOX:[id number] DOB: [birth year] DOA: 06/02/2012  PCP: [name], MD  Admit date: 06/02/2012 Discharge date: 06/06/2012  Recommendations for Outpatient Follow-up:  1. HH RN set up for ongoing home antibiotic therapy.  Vancomycin troughs and BMET to be drawn bi-weekly by RN and results faxed to Dr. Daiva Eves. 2. F/U final blood culture results (negative to date).  Discharge Diagnoses:  Principal Problem:    Fever Active Problems:    Bladder cancer    HTN (hypertension)    Nausea alone    Nephrostomy status    Bilateral hydronephrosis    DVT (deep venous thrombosis) 3/14    Bacteremia MRSA 5/14   Discharge Condition: Improved.  Diet recommendation: Low sodium, heart healthy.  History of present illness:  Chad Olson is an 67 y.o. male with a PMH of PE on chronic anticoagulation with Lovenox, radical cystoproctatectomy with ileal conduit and urinary diversion for refractory recurrent high-grade bladder cancer, s/p nephrostomy placement with recent (3 weeks ago) MRSA bacteremia, on home IV vancomycin via PICC for a planned treatment course of 6 weeks of antibiotics, and with plans for urology to replace/remove his current left nephrostomy after several weeks of abx. He had repeat cultures prior to his recent d/c which were negative. He presented to [facility] ED 06/02/12 with progressive generalized malaise, poor oral intake, subjective fevers, chills. No specific abdominal or urinary concerns. In ED, he was noted to have low grade fever of 99.5, Ct abdomen and pelvis with persistent bilateral hydronephrosis. TRH asked to admit for further evaluation.    Hospital Course by problem:  Principal Problem:  Fever / H/O Bacteremia MRSA 5/14  -Low grade in the setting of h/o MRSA bacteremia in the setting of a chronic indwelling left nephrostomy tube and double-J stents.  -PICC line placed on 05/17/2012 after clearance of blood cultures.  -CT of the  abdomen and pelvis done on admission shows persistent bilateral hydronephrosis and hydroureter status post cystoprostatectomy. No contrast excretion demonstrated into the dilated right collecting system, concerning for persistent obstruction at the ureteral insertion at the ileal conduit. The percutaneous nephrostomy and double-J left ureteral stents are unchanged.  -Blood and urine cultures done on admission, negative to date.  -Initially placed on vancomycin and Zosyn, Zosyn subsequently discontinued by ID consultant.  -Afebrile since admission. Active Problems:  Bladder cancer  -Follows with Dr. Clelia Croft.  HTN (hypertension)  -Blood pressure stable.  Nausea alone  -Given antinausea medications as needed. Resolved at discharge. Bilateral hydronephrosis status post left nephrostomy and left ureteral stent  -Persistent on CT scans.  -Urologist following.  -For surgical revision of anastamoses in next few weeks. DVT (deep venous thrombosis) 3/14  -Repeat ultrasound of the left lower extremity negative for acute DVT.  -Continue Lovenox per pharmacy.  Hypokalemia  -Potassium supplemented.  Acute renal failure  -Thought to be prerenal in etiology. IV fluids ordered. Creatinine trending down.  Anemia of chronic disease  -Hemoglobin stable. No current indication for transfusion.   Procedures:  None.  Medical Consultants:  Dr. Paulette Blanch [place], ID  Dr. Jerilee Field, Urology.   Discharge Exam: Filed Vitals:   06/06/12 0521  BP: 142/78  Pulse: 59  Temp: 97.8 F (36.6 C)  Resp: 16   Filed Vitals:   06/05/12 0621 06/05/12 1401 06/05/12 2033 06/06/12 0521  BP: 130/82 132/81 125/69 142/78  Pulse: 62 97 67 59  Temp: 98.4 F (36.9 C) 98.6 F (37 C) 97.9 F (36.6 C)  97.8 F (36.6 C)  TempSrc: Oral Oral Oral Oral  Resp: 16 16 16 16   Height:      Weight:      SpO2: 98% 98% 98% 100%    Gen:  NAD Cardiovascular:  RRR, No M/R/G Respiratory: Lungs CTAB Gastrointestinal:  Abdomen soft, NT/ND with normal active bowel sounds. Extremities: No C/E/C   Discharge Instructions  Discharge Orders   Future Appointments Provider Department Dept Phone   06/17/2012 2:00 PM Cliffton Asters, MD [facility] for Infectious Disease [phone]   06/27/2012 8:45 AM Judyann Munson, MD [facility] for Infectious Disease [phone]   Future Orders Complete By Expires     Call MD for:  extreme fatigue  As directed     Call MD for:  temperature >100.4  As directed     Diet - low sodium heart healthy  As directed     Face-to-face encounter (required for Medicare/Medicaid patients)  As directed     Comments:      I RAMA,CHRISTINA certify that this patient is under my care and that I, or a nurse practitioner or physician's assistant working with me, had a face-to-face encounter that meets the physician face-to-face encounter requirements with this patient on 06/06/2012. The encounter with the patient was in whole, or in part for the following medical condition(s) which is the primary reason for home health care (List medical condition): Prolonged IV therapy.  Will need biweekly BMET checks to assess renal function.  Vancomycin trough biweekly, call results to Dr. Daiva Eves in the ID clinic.    Questions:      The encounter with the patient was in whole, or in part, for the following medical condition, which is the primary reason for home health care:  MRSA bacteremia on prolonged IV therapy    I certify that, based on my findings, the following services are medically necessary home health services:  Nursing    My clinical findings support the need for the above services:  Unable to leave home safely without assistance and/or assistive device    Further, I certify that my clinical findings support that this patient is homebound due to:  Unable to leave home safely without assistance    Reason for Medically Necessary Home Health Services:  Skilled Nursing- Lab  Monitoring Requiring Frequent Medication Adjustment    Skilled Nursing- Assessment and Training for Infusion Therapy, Line Care, and Infection Control    Home Health  As directed     Questions:      To provide the following care/treatments:  RN    Increase activity slowly  As directed         Medication List    STOP taking these medications       enoxaparin Kit  Commonly known as:  LOVENOX      TAKE these medications       enoxaparin 120 MG/0.8ML injection  Commonly known as:  LOVENOX  Inject 105 mg into the skin 2 (two) times daily. Pt's doing 0.4ml     multivitamin with minerals Tabs  Take 1 tablet by mouth daily.     pantoprazole 40 MG tablet  Commonly known as:  PROTONIX  Take 40 mg by mouth daily.     pravastatin 20 MG tablet  Commonly known as:  PRAVACHOL  Take 20 mg by mouth every morning.     sodium chloride 0.9 % SOLN 250 mL with vancomycin 10 G SOLR 1,250 mg  Inject  1,250 mg into the vein every 12 (twelve) hours. Continue through June 25, 2012. Directions per Infectious Disease MD.           Follow-up Information   Follow up with Acey Lav, MD. (At your appt times noted below.)    Contact information:   [address] [place] [address] [phone]       Follow up with Sebastian Ache, MD. Schedule an appointment as soon as possible for a visit in 1 week.   Contact information:   [address] [place] [address] [phone]       Follow up with [name], MD. (As needed)    Contact information:   [address] [place] [address] [phone]        The results of significant diagnostics from this hospitalization (including imaging, microbiology, ancillary and laboratory) are listed below for reference.    Significant Diagnostic Studies: Dg Chest 2 View  06/02/2012   *RADIOLOGY REPORT*  Clinical Data: Fever  CHEST - 2 VIEW  Comparison: 05/13/2012   Findings: Lungs are clear. No pleural effusion or pneumothorax.  Cardiomediastinal silhouette is within normal limits.  Right arm PICC terminates at the cavoatrial junction.  Mild degenerative changes of the visualized thoracolumbar spine.  IMPRESSION: No evidence of acute cardiopulmonary disease.   Original Report Authenticated By: Charline Bills, M.D.   Ct Abdomen Pelvis W Contrast  06/02/2012   *RADIOLOGY REPORT*  Clinical Data: Abdominal pelvic pain.  History of bladder cancer status post cystoprostatectomy 02/22/2012.  History of diabetes and hypertension.  CT ABDOMEN AND PELVIS WITH CONTRAST  Technique:  Multidetector CT imaging of the abdomen and pelvis was performed following the standard protocol during bolus administration of intravenous contrast.  Contrast: OMNIPAQUE IOHEXOL 300 MG/ML  SOLN  Comparison: Pelvic CT 05/15/2012.  Abdominal pelvic CT 05/13/2012.  Findings: The lung bases are clear.  There is no pleural or pericardial effusion.  A small hiatal hernia is noted.  The liver, gallbladder, spleen, pancreas and adrenal glands appear normal.  There is a percutaneous nephrostomy on the left.  A double-J left ureteral stent appears unchanged with the distal end of the stent in the distal ureter posterior to the ileal loop.  Mild dilatation of the left renal pelvis and ureter is unchanged.  Delayed images demonstrate some contrast excretion into the left renal pelvis. Small left renal cysts are stable.  On the right, there is persistent hydronephrosis with mildly heterogeneous renal parenchymal enhancement.  Delayed images demonstrate no excretion into the right collecting system.  Right renal cysts are stable.  The ileal loop of appears stable without definite adjacent fluid collection or surrounding inflammatory change.  Free pelvic fluid has nearly completely resolved.  There is no evidence of pelvic mass status post cystectomy and prostatectomy. Stool is present throughout the colon.  There  is no evidence of bowel obstruction.  There is no extravasated enteric contrast.  The appendix appears normal.  Lumbar spondylosis and bilateral L5 pars defects are noted.  No acute osseous findings are evident.  IMPRESSION:  1.  Persistent bilateral hydronephrosis and hydroureter status post cystoprostatectomy.  No contrast excretion is demonstrated into the dilated right collecting system, concerning for persistent obstruction at the ureteral insertion at the ileoconduit.  Some contrast excretion is demonstrated into the left collecting system on the delayed images; the percutaneous nephrostomy and double-J left ureteral stent are unchanged,  the latter with its distal loop in the distal ureter.  2.  Resolving postsurgical changes in the pelvis.  No new inflammatory changes or fluid collections. 3.  No evidence of bowel obstruction or acute process.   Original Report Authenticated By: Carey Bullocks, M.D.    Labs:  Basic Metabolic Panel:  Recent Labs Lab 06/02/12 1604 06/03/12 0600 06/04/12 0450  NA 145 141 144  K 3.5 3.0* 3.4*  CL 106 105 110  CO2 27 27 27   GLUCOSE 108* 100* 111*  BUN 13 11 10   CREATININE 1.45* 1.38* 1.33  CALCIUM 9.6 8.8 8.5   GFR Estimated Creatinine Clearance: 66.9 ml/min (by C-G formula based on Cr of 1.33). Liver Function Tests:  Recent Labs Lab 06/02/12 1604  AST 15  ALT 11  ALKPHOS 69  BILITOT 0.6  PROT 8.0  ALBUMIN 3.3*   CBC:  Recent Labs Lab 06/02/12 1604 06/03/12 0600 06/04/12 0450  WBC 9.5 6.6 6.7  NEUTROABS 7.0  --   --   HGB 13.7 10.8* 10.7*  HCT 43.2 34.9* 33.1*  MCV 91.7 91.6 92.2  PLT 200 199 233   Microbiology Recent Results (from the past 240 hour(s))  URINE CULTURE     Status: None   Collection Time    06/02/12  3:35 PM      Result Value Range Status   Specimen Description URINE, CLEAN CATCH   Final   Special Requests NONE   Final   Culture  Setup Time 06/03/2012 03:37   Final   Colony Count NO GROWTH   Final   Culture  NO GROWTH   Final   Report Status 06/04/2012 FINAL   Final  CULTURE, BLOOD (ROUTINE X 2)     Status: None   Collection Time    06/02/12  4:04 PM      Result Value Range Status   Specimen Description BLOOD LEFT HAND   Final   Special Requests BOTTLES DRAWN AEROBIC ONLY 2CC   Final   Culture  Setup Time 06/02/2012 21:12   Final   Culture     Final   Value:        BLOOD CULTURE RECEIVED NO GROWTH TO DATE CULTURE WILL BE HELD FOR 5 DAYS BEFORE ISSUING A FINAL NEGATIVE REPORT   Report Status PENDING   Incomplete  CULTURE, BLOOD (ROUTINE X 2)     Status: None   Collection Time    06/02/12  4:25 PM      Result Value Range Status   Specimen Description BLOOD LEFT HAND  2 ML IN [facility] BOTTLE   Final   Special Requests NONE   Final   Culture  Setup Time 06/02/2012 21:13   Final   Culture     Final   Value:        BLOOD CULTURE RECEIVED NO GROWTH TO DATE CULTURE WILL BE HELD FOR 5 DAYS BEFORE ISSUING A FINAL NEGATIVE REPORT   Report Status PENDING   Incomplete    Time coordinating discharge: 35 minutes.  Signed:  RAMA,CHRISTINA  Pager [phone] [facility] 06/06/2012, 8:14 AM

## 2012-06-06 NOTE — Care Management Note (Signed)
    Page 1 of 2   06/06/2012     4:42:50 PM   CARE MANAGEMENT NOTE 06/06/2012  Patient:  Chad Olson, Chad Olson   Account Number:  [id number]  Date Initiated:  06/03/2012  Documentation initiated by:  DAVIS,TYMEEKA  Subjective/Objective Assessment:   67 yo male admitted with fever. Hx of MRSA. PTA pt from home with [facility] services.     Action/Plan:   Home when stable with IV ABX   Anticipated DC Date:     Anticipated DC Plan:  HOME W HOME HEALTH SERVICES      DC Planning Services  CM consult      Choice offered to / List presented to:  C-1 Patient        HH arranged  HH-1 RN  IV Antibiotics      HH agency  [facility]   Status of service:  Completed, signed off Medicare Important Message given?   (If response is "NO", the following Medicare IM given date fields will be blank) Date Medicare IM given:   Date Additional Medicare IM given:    Discharge Disposition:  HOME W HOME HEALTH SERVICES  Per UR Regulation:  Reviewed for med. necessity/level of care/duration of stay  If discussed at Long Length of Stay Meetings, dates discussed:    Comments:  06/06/12 MMcGibboney, RN, BSN Pt was active with [facility]. Spoke with pt's wife who stated that they will continue with [facility] for Home Health needs. In house rep with [facility] aware.  06/03/12 1250 Leonie Green [phone] Per MD conversation with adult daughter via telephone pt to discharge back to [facility]. Awaiting PT eval. Upon evaluation MD to order [facility]  for medication management & HHA, possible PT. Per family choice Genevieve Norlander to provide [facility] services. [facility] intake notified. [facility] Nor[facility] rep Venia Minks to arrange start of care. Pt will require tx to [facility] upon discharge. CSW notified.

## 2012-06-07 ENCOUNTER — Telehealth: Payer: Self-pay | Admitting: Licensed Clinical Social Worker

## 2012-06-07 ENCOUNTER — Other Ambulatory Visit: Payer: Self-pay | Admitting: Urology

## 2012-06-07 NOTE — Telephone Encounter (Signed)
He apparently had temp of 99ish and episode of dry heaving. Still having anxiety about nephrostomy tube, stent, pICC being infected. She may bring hi in over the weekend and she may call for further guidance. For now keeping him at home and obsrving him seems reasonable he is scheduled for surgery next week.  Will need reschedule appt with Dr. Orvan Falconer

## 2012-06-07 NOTE — Telephone Encounter (Signed)
Patient just d/c from hospital yesterday, started having low grade temperature 99.3 and not appetite. Patient's wife is worried and wants Dr. Daiva Eves to call her.

## 2012-06-08 LAB — CULTURE, BLOOD (ROUTINE X 2)
Culture: NO GROWTH
Culture: NO GROWTH

## 2012-06-11 ENCOUNTER — Encounter (HOSPITAL_COMMUNITY): Payer: Self-pay | Admitting: Pharmacy Technician

## 2012-06-13 ENCOUNTER — Encounter (HOSPITAL_COMMUNITY): Payer: Self-pay | Admitting: *Deleted

## 2012-06-13 ENCOUNTER — Inpatient Hospital Stay (HOSPITAL_COMMUNITY)
Admission: RE | Admit: 2012-06-13 | Discharge: 2012-06-18 | DRG: 567 | Disposition: A | Discharge status: Home Health | Payer: BC Managed Care – PPO | Source: Ambulatory Visit | Attending: Urology | Admitting: Urology

## 2012-06-13 DIAGNOSIS — N2889 Other specified disorders of kidney and ureter: Secondary | ICD-10-CM | POA: Diagnosis present

## 2012-06-13 DIAGNOSIS — Z6831 Body mass index (BMI) 31.0-31.9, adult: Secondary | ICD-10-CM

## 2012-06-13 DIAGNOSIS — I10 Essential (primary) hypertension: Secondary | ICD-10-CM | POA: Diagnosis present

## 2012-06-13 DIAGNOSIS — E876 Hypokalemia: Secondary | ICD-10-CM | POA: Diagnosis present

## 2012-06-13 DIAGNOSIS — Z9079 Acquired absence of other genital organ(s): Secondary | ICD-10-CM

## 2012-06-13 DIAGNOSIS — R7881 Bacteremia: Secondary | ICD-10-CM | POA: Diagnosis present

## 2012-06-13 DIAGNOSIS — H9190 Unspecified hearing loss, unspecified ear: Secondary | ICD-10-CM | POA: Diagnosis present

## 2012-06-13 DIAGNOSIS — I2782 Chronic pulmonary embolism: Secondary | ICD-10-CM | POA: Diagnosis present

## 2012-06-13 DIAGNOSIS — E669 Obesity, unspecified: Secondary | ICD-10-CM | POA: Diagnosis present

## 2012-06-13 DIAGNOSIS — Z906 Acquired absence of other parts of urinary tract: Secondary | ICD-10-CM

## 2012-06-13 DIAGNOSIS — E785 Hyperlipidemia, unspecified: Secondary | ICD-10-CM | POA: Diagnosis present

## 2012-06-13 DIAGNOSIS — E119 Type 2 diabetes mellitus without complications: Secondary | ICD-10-CM | POA: Diagnosis present

## 2012-06-13 DIAGNOSIS — C679 Malignant neoplasm of bladder, unspecified: Secondary | ICD-10-CM | POA: Diagnosis present

## 2012-06-13 DIAGNOSIS — I82509 Chronic embolism and thrombosis of unspecified deep veins of unspecified lower extremity: Secondary | ICD-10-CM | POA: Diagnosis present

## 2012-06-13 DIAGNOSIS — Z936 Other artificial openings of urinary tract status: Secondary | ICD-10-CM

## 2012-06-13 DIAGNOSIS — Z96659 Presence of unspecified artificial knee joint: Secondary | ICD-10-CM

## 2012-06-13 DIAGNOSIS — Z7901 Long term (current) use of anticoagulants: Secondary | ICD-10-CM

## 2012-06-13 DIAGNOSIS — R82998 Other abnormal findings in urine: Secondary | ICD-10-CM | POA: Diagnosis present

## 2012-06-13 DIAGNOSIS — N135 Crossing vessel and stricture of ureter without hydronephrosis: Principal | ICD-10-CM | POA: Diagnosis present

## 2012-06-13 DIAGNOSIS — K660 Peritoneal adhesions (postprocedural) (postinfection): Secondary | ICD-10-CM | POA: Diagnosis present

## 2012-06-13 DIAGNOSIS — K219 Gastro-esophageal reflux disease without esophagitis: Secondary | ICD-10-CM | POA: Diagnosis present

## 2012-06-13 DIAGNOSIS — A4902 Methicillin resistant Staphylococcus aureus infection, unspecified site: Secondary | ICD-10-CM | POA: Diagnosis present

## 2012-06-13 LAB — CBC WITH DIFFERENTIAL/PLATELET
Eosinophils Absolute: 0.3 10*3/uL (ref 0.0–0.7)
Eosinophils Relative: 5 % (ref 0–5)
HCT: 38.7 % — ABNORMAL LOW (ref 39.0–52.0)
Lymphocytes Relative: 33 % (ref 12–46)
Lymphs Abs: 2 10*3/uL (ref 0.7–4.0)
MCH: 28.7 pg (ref 26.0–34.0)
MCV: 91.1 fL (ref 78.0–100.0)
Monocytes Absolute: 0.6 10*3/uL (ref 0.1–1.0)
RBC: 4.25 MIL/uL (ref 4.22–5.81)
WBC: 6 10*3/uL (ref 4.0–10.5)

## 2012-06-13 LAB — COMPREHENSIVE METABOLIC PANEL
AST: 14 U/L (ref 0–37)
Albumin: 2.9 g/dL — ABNORMAL LOW (ref 3.5–5.2)
Alkaline Phosphatase: 58 U/L (ref 39–117)
BUN: 10 mg/dL (ref 6–23)
Chloride: 104 mEq/L (ref 96–112)
Creatinine, Ser: 1.25 mg/dL (ref 0.50–1.35)
Potassium: 3.3 mEq/L — ABNORMAL LOW (ref 3.5–5.1)
Total Protein: 7.2 g/dL (ref 6.0–8.3)

## 2012-06-13 LAB — SURGICAL PCR SCREEN
MRSA, PCR: NEGATIVE
Staphylococcus aureus: NEGATIVE

## 2012-06-13 LAB — GLUCOSE, CAPILLARY: Glucose-Capillary: 102 mg/dL — ABNORMAL HIGH (ref 70–99)

## 2012-06-13 MED ORDER — KCL IN DEXTROSE-NACL 20-5-0.45 MEQ/L-%-% IV SOLN
INTRAVENOUS | Status: DC
  Administered 2012-06-13: 50 mL/h via INTRAVENOUS
  Filled 2012-06-13 (×2): qty 1000

## 2012-06-13 MED ORDER — PIPERACILLIN-TAZOBACTAM 3.375 G IVPB 30 MIN
3.3750 g | Freq: Once | INTRAVENOUS | Status: AC
  Administered 2012-06-13: 3.375 g via INTRAVENOUS
  Filled 2012-06-13: qty 50

## 2012-06-13 MED ORDER — PEG 3350-KCL-NA BICARB-NACL 420 G PO SOLR
4000.0000 mL | Freq: Once | ORAL | Status: AC
  Administered 2012-06-13: 4000 mL via ORAL

## 2012-06-13 MED ORDER — VANCOMYCIN HCL IN DEXTROSE 1-5 GM/200ML-% IV SOLN: 1000.0000 mg | INTRAVENOUS | Status: DC

## 2012-06-13 MED ORDER — VANCOMYCIN HCL 10 G IV SOLR
1500.0000 mg | INTRAVENOUS | Status: AC
  Administered 2012-06-14: 1500 mg via INTRAVENOUS
  Filled 2012-06-13 (×2): qty 1500

## 2012-06-13 MED ORDER — PIPERACILLIN-TAZOBACTAM 3.375 G IVPB 30 MIN
3.3750 g | INTRAVENOUS | Status: AC
  Administered 2012-06-14: 3.375 g via INTRAVENOUS
  Filled 2012-06-13: qty 50

## 2012-06-13 MED ORDER — SODIUM CHLORIDE 0.9 % IJ SOLN
10.0000 mL | INTRAMUSCULAR | Status: DC | PRN
  Administered 2012-06-15: 20 mL
  Administered 2012-06-17 – 2012-06-18 (×3): 10 mL
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
## 2012-06-13 NOTE — Progress Notes (Signed)
[facility]  Patient Status: Active with [facility] up to this admission.  [facility] is providing the following services: Pt receiving home health nursing and Home infusion pharmacy for IV antibiotics at home.  [facility] hospital team will follow and support transition home when deemed appropriate by physician.  If patient discharges after hours, please call [phone].   Sedalia Muta 06/13/2012, 11:29 AM
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
## 2012-06-14 ENCOUNTER — Encounter (HOSPITAL_COMMUNITY): Payer: Self-pay | Admitting: Certified Registered Nurse Anesthetist

## 2012-06-14 ENCOUNTER — Encounter (HOSPITAL_COMMUNITY): Payer: Self-pay | Admitting: Anesthesiology

## 2012-06-14 ENCOUNTER — Encounter (HOSPITAL_COMMUNITY)
Admission: RE | Disposition: A | Discharge status: Home Health | Payer: Self-pay | Source: Ambulatory Visit | Attending: Urology

## 2012-06-14 ENCOUNTER — Inpatient Hospital Stay (HOSPITAL_COMMUNITY): Payer: BC Managed Care – PPO | Admitting: Anesthesiology

## 2012-06-14 HISTORY — PX: URETERAL REIMPLANTION: SHX2611

## 2012-06-14 LAB — BASIC METABOLIC PANEL
BUN: 9 mg/dL (ref 6–23)
CO2: 23 mEq/L (ref 19–32)
Calcium: 9 mg/dL (ref 8.4–10.5)
Creatinine, Ser: 1.33 mg/dL (ref 0.50–1.35)
GFR calc non Af Amer: 54 mL/min — ABNORMAL LOW (ref >90)
Glucose, Bld: 178 mg/dL — ABNORMAL HIGH (ref 70–99)
Sodium: 142 mEq/L (ref 135–145)

## 2012-06-14 LAB — URINE CULTURE
Colony Count: NO GROWTH
Culture: NO GROWTH

## 2012-06-14 LAB — GLUCOSE, CAPILLARY: Glucose-Capillary: 105 mg/dL — ABNORMAL HIGH (ref 70–99)

## 2012-06-14 SURGERY — REIMPLANTATION, URETER
Anesthesia: General | Laterality: Bilateral | Wound class: Clean Contaminated

## 2012-06-14 MED ORDER — VANCOMYCIN HCL IN DEXTROSE 1-5 GM/200ML-% IV SOLN
1000.0000 mg | INTRAVENOUS | Status: AC
  Administered 2012-06-14: 1000 mg via INTRAVENOUS
  Filled 2012-06-14: qty 200

## 2012-06-14 MED ORDER — METOCLOPRAMIDE HCL 5 MG/ML IJ SOLN
INTRAMUSCULAR | Status: DC | PRN
  Administered 2012-06-14: 10 mg via INTRAVENOUS

## 2012-06-14 MED ORDER — BUPIVACAINE LIPOSOME 1.3 % IJ SUSP
20.0000 mL | Freq: Once | INTRAMUSCULAR | Status: DC
  Filled 2012-06-14: qty 20

## 2012-06-14 MED ORDER — DOCUSATE SODIUM 100 MG PO CAPS
100.0000 mg | ORAL_CAPSULE | Freq: Two times a day (BID) | ORAL | Status: DC
  Administered 2012-06-14 – 2012-06-18 (×8): 100 mg via ORAL
  Filled 2012-06-14 (×9): qty 1

## 2012-06-14 MED ORDER — ENOXAPARIN SODIUM 40 MG/0.4ML ~~LOC~~ SOLN
40.0000 mg | SUBCUTANEOUS | Status: DC
  Administered 2012-06-15 – 2012-06-18 (×4): 40 mg via SUBCUTANEOUS
  Filled 2012-06-14 (×6): qty 0.4

## 2012-06-14 MED ORDER — PHENYLEPHRINE HCL 10 MG/ML IJ SOLN
INTRAMUSCULAR | Status: DC | PRN
  Administered 2012-06-14 (×2): 80 ug via INTRAVENOUS

## 2012-06-14 MED ORDER — BUPIVACAINE 0.25 % ON-Q PUMP SINGLE CATH 300ML
300.0000 mL | INJECTION | Status: DC
  Administered 2012-06-14: 300 mL
  Filled 2012-06-14: qty 300

## 2012-06-14 MED ORDER — GLYCOPYRROLATE 0.2 MG/ML IJ SOLN
INTRAMUSCULAR | Status: DC | PRN
  Administered 2012-06-14: 0.6 mg via INTRAVENOUS

## 2012-06-14 MED ORDER — PROPOFOL 10 MG/ML IV BOLUS
INTRAVENOUS | Status: DC | PRN
  Administered 2012-06-14: 150 mg via INTRAVENOUS

## 2012-06-14 MED ORDER — HYDROMORPHONE HCL PF 1 MG/ML IJ SOLN: 0.2500 mg | INTRAMUSCULAR | Status: DC | PRN

## 2012-06-14 MED ORDER — DEXAMETHASONE SODIUM PHOSPHATE 10 MG/ML IJ SOLN
INTRAMUSCULAR | Status: DC | PRN
  Administered 2012-06-14: 10 mg via INTRAVENOUS

## 2012-06-14 MED ORDER — VANCOMYCIN HCL 10 G IV SOLR: 1500.0000 mg | INTRAVENOUS | Status: DC

## 2012-06-14 MED ORDER — KETAMINE HCL 10 MG/ML IJ SOLN
INTRAMUSCULAR | Status: DC | PRN
  Administered 2012-06-14: 20 mg via INTRAVENOUS

## 2012-06-14 MED ORDER — SIMVASTATIN 10 MG PO TABS
10.0000 mg | ORAL_TABLET | Freq: Every day | ORAL | Status: DC
  Administered 2012-06-14 – 2012-06-17 (×4): 10 mg via ORAL
  Filled 2012-06-14 (×5): qty 1

## 2012-06-14 MED ORDER — SENNA 8.6 MG PO TABS
1.0000 | ORAL_TABLET | Freq: Two times a day (BID) | ORAL | Status: DC
  Administered 2012-06-14 – 2012-06-17 (×7): 8.6 mg via ORAL
  Filled 2012-06-14 (×7): qty 1

## 2012-06-14 MED ORDER — PROMETHAZINE HCL 25 MG/ML IJ SOLN
6.2500 mg | INTRAMUSCULAR | Status: DC | PRN
  Administered 2012-06-14: 6.25 mg via INTRAVENOUS

## 2012-06-14 MED ORDER — HYDROMORPHONE HCL PF 1 MG/ML IJ SOLN
0.5000 mg | INTRAMUSCULAR | Status: DC | PRN
  Administered 2012-06-14 – 2012-06-17 (×10): 1 mg via INTRAVENOUS
  Filled 2012-06-14 (×11): qty 1

## 2012-06-14 MED ORDER — PANTOPRAZOLE SODIUM 40 MG PO TBEC
40.0000 mg | DELAYED_RELEASE_TABLET | Freq: Every day | ORAL | Status: DC
  Administered 2012-06-14 – 2012-06-18 (×5): 40 mg via ORAL
  Filled 2012-06-14 (×5): qty 1

## 2012-06-14 MED ORDER — ROCURONIUM BROMIDE 100 MG/10ML IV SOLN
INTRAVENOUS | Status: DC | PRN
  Administered 2012-06-14: 20 mg via INTRAVENOUS
  Administered 2012-06-14: 5 mg via INTRAVENOUS
  Administered 2012-06-14: 20 mg via INTRAVENOUS
  Administered 2012-06-14: 10 mg via INTRAVENOUS
  Administered 2012-06-14: 5 mg via INTRAVENOUS
  Administered 2012-06-14: 10 mg via INTRAVENOUS
  Administered 2012-06-14: 40 mg via INTRAVENOUS
  Administered 2012-06-14 (×4): 10 mg via INTRAVENOUS

## 2012-06-14 MED ORDER — 0.9 % SODIUM CHLORIDE (POUR BTL) OPTIME
TOPICAL | Status: DC | PRN
  Administered 2012-06-14: 3000 mL

## 2012-06-14 MED ORDER — MIDAZOLAM HCL 5 MG/5ML IJ SOLN
INTRAMUSCULAR | Status: DC | PRN
  Administered 2012-06-14: 2 mg via INTRAVENOUS

## 2012-06-14 MED ORDER — ONDANSETRON HCL 4 MG/2ML IJ SOLN: 4.0000 mg | INTRAMUSCULAR | Status: DC | PRN

## 2012-06-14 MED ORDER — ONDANSETRON HCL 4 MG/2ML IJ SOLN
INTRAMUSCULAR | Status: DC | PRN
  Administered 2012-06-14: 4 mg via INTRAVENOUS

## 2012-06-14 MED ORDER — HYDROMORPHONE HCL PF 1 MG/ML IJ SOLN
INTRAMUSCULAR | Status: DC | PRN
  Administered 2012-06-14 (×8): 0.5 mg via INTRAVENOUS

## 2012-06-14 MED ORDER — OXYCODONE HCL 5 MG PO TABS
5.0000 mg | ORAL_TABLET | ORAL | Status: DC | PRN
  Administered 2012-06-17 – 2012-06-18 (×2): 5 mg via ORAL
  Filled 2012-06-14 (×3): qty 1

## 2012-06-14 MED ORDER — ACETAMINOPHEN 10 MG/ML IV SOLN
1000.0000 mg | Freq: Four times a day (QID) | INTRAVENOUS | Status: AC
  Administered 2012-06-14 – 2012-06-15 (×4): 1000 mg via INTRAVENOUS
  Filled 2012-06-14 (×4): qty 100

## 2012-06-14 MED ORDER — NEOSTIGMINE METHYLSULFATE 1 MG/ML IJ SOLN
INTRAMUSCULAR | Status: DC | PRN
  Administered 2012-06-14: 5 mg via INTRAVENOUS

## 2012-06-14 MED ORDER — FENTANYL CITRATE 0.05 MG/ML IJ SOLN
INTRAMUSCULAR | Status: DC | PRN
  Administered 2012-06-14: 50 ug via INTRAVENOUS
  Administered 2012-06-14: 100 ug via INTRAVENOUS
  Administered 2012-06-14 (×4): 50 ug via INTRAVENOUS

## 2012-06-14 MED ORDER — LACTATED RINGERS IV SOLN
INTRAVENOUS | Status: DC | PRN
  Administered 2012-06-14: 07:00:00 via INTRAVENOUS

## 2012-06-14 MED ORDER — SUCCINYLCHOLINE CHLORIDE 20 MG/ML IJ SOLN
INTRAMUSCULAR | Status: DC | PRN
  Administered 2012-06-14: 100 mg via INTRAVENOUS

## 2012-06-14 MED ORDER — SODIUM CHLORIDE 0.9 % IV SOLN
1.5000 g | Freq: Four times a day (QID) | INTRAVENOUS | Status: AC
  Administered 2012-06-14 – 2012-06-15 (×3): 1.5 g via INTRAVENOUS
  Filled 2012-06-14 (×3): qty 1.5

## 2012-06-14 MED ORDER — LIDOCAINE HCL (CARDIAC) 20 MG/ML IV SOLN
INTRAVENOUS | Status: DC | PRN
  Administered 2012-06-14: 100 mg via INTRAVENOUS

## 2012-06-14 MED ORDER — KCL IN DEXTROSE-NACL 20-5-0.45 MEQ/L-%-% IV SOLN
INTRAVENOUS | Status: DC
  Administered 2012-06-14 – 2012-06-16 (×5): via INTRAVENOUS
  Filled 2012-06-14 (×6): qty 1000

## 2012-06-14 MED ORDER — BUPIVACAINE ON-Q PAIN PUMP (FOR ORDER SET NO CHG)
INJECTION | Status: AC
  Filled 2012-06-14: qty 1

## 2012-06-14 SURGICAL SUPPLY — 72 items
APL SKNCLS STERI-STRIP NONHPOA (GAUZE/BANDAGES/DRESSINGS) ×2
ATTRACTOMAT 16X20 MAGNETIC DRP (DRAPES) IMPLANT
BAG URINE DRAINAGE (UROLOGICAL SUPPLIES) ×1 IMPLANT
BENZOIN TINCTURE PRP APPL 2/3 (GAUZE/BANDAGES/DRESSINGS) ×2 IMPLANT
BLADE EXTENDED COATED 6.5IN (ELECTRODE) ×2 IMPLANT
BLADE HEX COATED 2.75 (ELECTRODE) ×2 IMPLANT
CANISTER SUCTION 2500CC (MISCELLANEOUS) ×2 IMPLANT
CATH FOLEY 2WAY SLVR  5CC 22FR (CATHETERS)
CATH FOLEY 2WAY SLVR 5CC 22FR (CATHETERS) ×1 IMPLANT
CATH KIT ON Q 5IN SLV (PAIN MANAGEMENT) ×1 IMPLANT
CATH ROBINSON RED A/P 16FR (CATHETERS) ×1 IMPLANT
CATH ROBINSON RED A/P 8FR (CATHETERS) IMPLANT
CHLORAPREP W/TINT 26ML (MISCELLANEOUS) ×2 IMPLANT
CLIP LIGATING HEM O LOK PURPLE (MISCELLANEOUS) ×3 IMPLANT
CLIP LIGATING HEMO O LOK GREEN (MISCELLANEOUS) ×3 IMPLANT
CLOTH BEACON ORANGE TIMEOUT ST (SAFETY) ×2 IMPLANT
COVER SURGICAL LIGHT HANDLE (MISCELLANEOUS) ×2 IMPLANT
DISSECTOR ROUND CHERRY 3/8 STR (MISCELLANEOUS) ×3 IMPLANT
DRAIN CHANNEL 10F 3/8 F FF (DRAIN) IMPLANT
DRAIN CHANNEL RND F F (WOUND CARE) ×1 IMPLANT
DRAIN PENROSE 18X1/4 LTX STRL (WOUND CARE) IMPLANT
DRAPE LAPAROSCOPIC ABDOMINAL (DRAPES) ×1 IMPLANT
DRAPE LAPAROTOMY TRNSV 102X78 (DRAPE) IMPLANT
DRAPE LG THREE QUARTER DISP (DRAPES) ×1 IMPLANT
DRAPE TABLE BACK 44X90 PK DISP (DRAPES) ×2 IMPLANT
DRAPE WARM FLUID 44X44 (DRAPE) ×2 IMPLANT
DRSG PAD ABDOMINAL 8X10 ST (GAUZE/BANDAGES/DRESSINGS) ×2 IMPLANT
DRSG TEGADERM 4X4.75 (GAUZE/BANDAGES/DRESSINGS) ×1 IMPLANT
ELECT REM PT RETURN 9FT ADLT (ELECTROSURGICAL) ×2
ELECTRODE REM PT RTRN 9FT ADLT (ELECTROSURGICAL) ×1 IMPLANT
EVACUATOR SILICONE 100CC (DRAIN) ×1 IMPLANT
GAUZE SPONGE 4X4 16PLY XRAY LF (GAUZE/BANDAGES/DRESSINGS) ×2 IMPLANT
GLOVE BIOGEL M 8.0 STRL (GLOVE) ×1 IMPLANT
GOWN STRL REIN XL XLG (GOWN DISPOSABLE) ×3 IMPLANT
HEMOSTAT SURGICEL 4X8 (HEMOSTASIS) ×1 IMPLANT
KIT BASIN OR (CUSTOM PROCEDURE TRAY) ×2 IMPLANT
LOOP VESSEL MAXI BLUE (MISCELLANEOUS) ×2 IMPLANT
LUBRICANT JELLY ST 5GR 8946 (MISCELLANEOUS) ×2 IMPLANT
NEEDLE HYPO 22GX1.5 SAFETY (NEEDLE) IMPLANT
NS IRRIG 1000ML POUR BTL (IV SOLUTION) ×2 IMPLANT
PACK GENERAL/GYN (CUSTOM PROCEDURE TRAY) ×2 IMPLANT
POSITIONER SURGICAL ARM (MISCELLANEOUS) ×2 IMPLANT
SLEEVE SURGEON STRL (DRAPES) ×1 IMPLANT
SPONGE DRAIN TRACH 4X4 STRL 2S (GAUZE/BANDAGES/DRESSINGS) ×1 IMPLANT
SPONGE GAUZE 4X4 12PLY (GAUZE/BANDAGES/DRESSINGS) ×1 IMPLANT
SPONGE LAP 18X18 X RAY DECT (DISPOSABLE) ×2 IMPLANT
SPONGE LAP 4X18 X RAY DECT (DISPOSABLE) ×1 IMPLANT
STAPLER VISISTAT 35W (STAPLE) ×2 IMPLANT
STENT SET URETHERAL LEFT 7FR (STENTS) ×1 IMPLANT
STENT SET URETHERAL RIGHT 7FR (STENTS) ×1 IMPLANT
SUT CHROMIC 4 0 RB 1X27 (SUTURE) ×3 IMPLANT
SUT ETHILON 3 0 PS 1 (SUTURE) ×1 IMPLANT
SUT MNCRL 3 0 VIOLET RB1 (SUTURE) IMPLANT
SUT MONOCRYL 3 0 RB1 (SUTURE) ×1
SUT PDS AB 1 CTX 36 (SUTURE) ×2 IMPLANT
SUT PDS AB 1 TP1 96 (SUTURE) ×2 IMPLANT
SUT SILK 3 0 SH CR/8 (SUTURE) ×1 IMPLANT
SUT VIC AB 0 CT1 18XCR BRD 8 (SUTURE) IMPLANT
SUT VIC AB 0 CT1 27 (SUTURE) ×4
SUT VIC AB 0 CT1 27XBRD ANTBC (SUTURE) IMPLANT
SUT VIC AB 0 CT1 8-18 (SUTURE)
SUT VIC AB 3-0 SH 8-18 (SUTURE) ×1 IMPLANT
SUT VIC AB 4-0 RB1 27 (SUTURE) ×14
SUT VIC AB 4-0 RB1 27XBRD (SUTURE) IMPLANT
SYR 27GX1/2 1ML LL SAFETY (SYRINGE) ×1 IMPLANT
SYR CONTROL 10ML LL (SYRINGE) IMPLANT
SYSTEM UROSTOMY GENTLE TOUCH (WOUND CARE) ×1 IMPLANT
TAPE CLOTH SURG 4X10 WHT LF (GAUZE/BANDAGES/DRESSINGS) ×1 IMPLANT
TAPE UMBILICAL COTTON 1/8X30 (MISCELLANEOUS) ×1 IMPLANT
TOWEL OR 17X26 10 PK STRL BLUE (TOWEL DISPOSABLE) ×3 IMPLANT
TUBE FEEDING 8FR 16IN STR KANG (MISCELLANEOUS) ×1 IMPLANT
WATER STERILE IRR 1500ML POUR (IV SOLUTION) ×1 IMPLANT

## 2012-06-14 NOTE — Transfer of Care (Signed)
Immediate Anesthesia Transfer of Care Note  Patient: Chad Olson  Procedure(s) Performed: Procedure(s) (LRB): BILATERAL OPEN URETERAL REIMPLATATION TO ILEAL CONDUIT AND PAIN PUMP IMPLEMENTATION (Bilateral)  Patient Location: PACU  Anesthesia Type: General  Level of Consciousness: sedated, patient cooperative and responds to stimulaton  Airway & Oxygen Therapy: Patient Spontanous Breathing and Patient connected to face mask oxgen  Post-op Assessment: Report given to PACU RN and Post -op Vital signs reviewed and stable  Post vital signs: Reviewed and stable  Complications: No apparent anesthesia complications

## 2012-06-14 NOTE — Anesthesia Postprocedure Evaluation (Signed)
  Anesthesia Post-op Note  Patient: [name]  Procedure(s) Performed: Procedure(s) (LRB): BILATERAL OPEN URETERAL REIMPLATATION TO ILEAL CONDUIT AND PAIN PUMP IMPLEMENTATION (Bilateral)  Patient Location: PACU  Anesthesia Type: General  Level of Consciousness: awake and alert   Airway and Oxygen Therapy: Patient Spontanous Breathing  Post-op Pain: mild  Post-op Assessment: Post-op Vital signs reviewed, Patient's Cardiovascular Status Stable, Respiratory Function Stable, Patent Airway and No signs of Nausea or vomiting  Last Vitals:  Filed Vitals:   06/14/12 1515  BP: 155/98  Pulse: 98  Temp: 36.6 C  Resp: 16    Post-op Vital Signs: stable   Complications: No apparent anesthesia complications

## 2012-06-14 NOTE — Interval H&P Note (Signed)
History and Physical Interval Note:  06/14/2012 6:50 AM  Chad Olson  has presented today for surgery, with the diagnosis of URETERAL - CONDUIT STRICTURES  The various methods of treatment have been discussed with the patient and family. After consideration of risks, benefits and other options for treatment, the patient has consented to  Procedure(s) with comments: BILATERAL OPEN URETERAL REIMPLATATION TO ILEAL CONDUIT (Bilateral) - BILATERAL OPEN URETERAL REIMPLATATION TO ILEAL CONDUIT  as a surgical intervention .  The patient's history has been reviewed, patient examined, no change in status, stable for surgery.  I have reviewed the patient's chart and labs.  Questions were answered to the patient's satisfaction.    Hgb >10, Cr 1.2. Completed "about half" of bowel prep, this is acceptable as plan is only to harvest small bowel if needed, which it likely will not.   Devlin Mcveigh

## 2012-06-14 NOTE — Preoperative (Signed)
Beta Blockers   Reason not to administer Beta Blockers:Not Applicable 

## 2012-06-14 NOTE — Progress Notes (Signed)
Nutrition Brief Note  Patient identified on the Malnutrition Screening Tool (MST) Report  Body mass index is 31.74 kg/(m^2). Patient meets criteria for class I obesity based on current BMI.   Current diet order is clear liquid for bilateral ureteral reimplantation to ileal conduit. Pt reports doing well with this diet. Labs and medications reviewed. Pt and family report pt eating well PTA, 3 meals/day with good appetite and stable weight. Diet likely to be advanced soon.   No nutrition interventions warranted at this time. If nutrition issues arise, please consult RD.   Levon Hedger MS, RD, LDN [phone] Pager [phone] After Hours Pager

## 2012-06-14 NOTE — Care Management Note (Addendum)
    Page 1 of 2   06/18/2012     10:44:47 AM   CARE MANAGEMENT NOTE 06/18/2012  Patient:  Chad Olson, Chad Olson   Account Number:  [id number]  Date Initiated:  06/14/2012  Documentation initiated by:  Lanier Clam  Subjective/Objective Assessment:   ADMITTED W/PRE-OP OPEN BILAT URETERAL REIMPLANTATION.READMIT-6/1-06/06/12     Action/Plan:   FROM HOME.ACTIVE W/[facility] HHRN-IV INFUSION-IV ABX.   Anticipated DC Date:  06/18/2012   Anticipated DC Plan:  HOME W HOME HEALTH SERVICES      DC Planning Services  CM consult      [facility] Choice  Resumption Of Svcs/PTA Provider   Choice offered to / List presented to:  C-1 Patient        HH arranged  HH-1 RN  IV Antibiotics      HH agency  [facility]   Status of service:  Completed, signed off Medicare Important Message given?   (If response is "NO", the following Medicare IM given date fields will be blank) Date Medicare IM given:   Date Additional Medicare IM given:    Discharge Disposition:  HOME W HOME HEALTH SERVICES  Per UR Regulation:  Reviewed for med. necessity/level of care/duration of stay  If discussed at Long Length of Stay Meetings, dates discussed:    Comments:  06/18/12 Keely Drennan RN,BSN NCM [phone] [facility] PAM CHANDLER IV ABX LIASON AWRE OF HHRN-IV ABX,PICC LINE FLUSH,LABS PER PHARMACY PROTOCAL,& D/C TODAY.  06/17/12 Atthew Coutant RN,BSN NCM [phone] [facility] FOLLOWING FOR LONG TERM IV ABX.AWAITING FINAL HH ORDERS.  06/14/12 Kennis Wissmann RN,BSN NCM [phone] [facility] KRISTEN REP ALREADY FOLLOWING FOR HHRN-IV ABX IF RESUMED.IF HH NEEDED WILL NEED HH ORDERS.S/P REIMPLANTATION TO ILEAL CONDUIT.

## 2012-06-14 NOTE — Brief Op Note (Signed)
06/13/2012 - 06/14/2012  1:40 PM  PATIENT:  Lavona Mound  67 y.o. male  PRE-OPERATIVE DIAGNOSIS:  URETERAL - CONDUIT STRICTURES  POST-OPERATIVE DIAGNOSIS:  URETERAL - CONDUIT STRICTURES  PROCEDURE:  Procedure(s) with comments: BILATERAL OPEN URETERAL REIMPLATATION TO ILEAL CONDUIT AND PAIN PUMP IMPLEMENTATION (Bilateral) - BILATERAL OPEN URETERAL REIMPLATATION TO ILEAL CONDUIT   SURGEON:  Surgeon(s) and Role:    * Sebastian Ache, MD - Primary  PHYSICIAN ASSISTANT:   ASSISTANTS: Lujean Rave, PA   ANESTHESIA:   local and general  EBL:  Total I/O In: 2000 [I.V.:2000] Out: 1050 [Urine:750; Blood:300]  BLOOD ADMINISTERED:none  DRAINS: 1 - JP to bulb suction, 2 - Rt (red) and Lt (blue) Bander ureteral stents   LOCAL MEDICATIONS USED:  MARCAINE     SPECIMEN:  No Specimen  DISPOSITION OF SPECIMEN:  N/A  COUNTS:  YES  TOURNIQUET:  * No tourniquets in log *  DICTATION: .Other Dictation: Dictation Number  [id number]  PLAN OF CARE: Admit to inpatient   PATIENT DISPOSITION:  PACU - hemodynamically stable.   Delay start of Pharmacological VTE agent (>24hrs) due to surgical blood loss or risk of bleeding: yes

## 2012-06-15 LAB — BASIC METABOLIC PANEL
CO2: 28 mEq/L (ref 19–32)
Calcium: 9 mg/dL (ref 8.4–10.5)
Chloride: 106 mEq/L (ref 96–112)
Creatinine, Ser: 1.36 mg/dL — ABNORMAL HIGH (ref 0.50–1.35)
GFR calc Af Amer: 61 mL/min — ABNORMAL LOW (ref >90)
GFR calc non Af Amer: 53 mL/min — ABNORMAL LOW (ref >90)
Glucose, Bld: 125 mg/dL — ABNORMAL HIGH (ref 70–99)

## 2012-06-15 LAB — VANCOMYCIN, TROUGH: Vancomycin Tr: 10.4 ug/mL (ref 10.0–20.0)

## 2012-06-15 MED ORDER — VANCOMYCIN HCL 10 G IV SOLR
1250.0000 mg | Freq: Two times a day (BID) | INTRAVENOUS | Status: DC
  Filled 2012-06-15: qty 1250

## 2012-06-15 MED ORDER — VANCOMYCIN HCL 10 G IV SOLR
1250.0000 mg | Freq: Two times a day (BID) | INTRAVENOUS | Status: DC
  Administered 2012-06-15 – 2012-06-18 (×6): 1250 mg via INTRAVENOUS
  Filled 2012-06-15 (×7): qty 1250

## 2012-06-15 NOTE — Progress Notes (Signed)
Pharmacy: Vancomycin Brief note  Repeated vancomycin trough tonight given changing renal function.  VT was Below goal of 15-20 (10.4).  Will resume vancomycin at 1250 mg IV q12h.  Continue to monitor   Clydene Fake PharmD Pager #: [phone] 6:05 PM 06/15/2012

## 2012-06-15 NOTE — Progress Notes (Signed)
1 Day Post-Op Subjective: Patient reports Incisional pain. Tolerates clear liquids Objective: Vital signs in last 24 hours: Temp:  [97.5 F (36.4 C)-98.5 F (36.9 C)] 97.5 F (36.4 C) (06/14 1030) Pulse Rate:  [65-108] 65 (06/14 1030) Resp:  [14-16] 16 (06/14 1030) BP: (124-162)/(64-99) 136/64 mmHg (06/14 1030) SpO2:  [94 %-100 %] 100 % (06/14 1030)  Intake/Output from previous day: 06/13 0701 - 06/14 0700 In: 4791.3 [I.V.:4141.3; IV Piggyback:650] Out: 3385 [Urine:2935; Drains:150; Blood:300] Intake/Output this shift: Total I/O In: 480 [P.O.:480] Out: 765 [Urine:700; Drains:65]  Physical Exam:  General:Alert. GI: Abdomen: Soft.  Dressing clean  Ileal loop draining clear urine.  Stents in place Urinary output: 2935 cc Hgb: 11.1  Hct: 34.5 BUN: 9  Creat: 1.36  Lab Results:  Recent Labs  06/13/12 1710 06/14/12 1409 06/15/12 0500  HGB 12.2* 13.1 11.1*  HCT 38.7* 39.8 34.5*   BMET  Recent Labs  06/14/12 1409 06/15/12 0500  NA 142 141  K 3.6 3.2*  CL 109 106  CO2 23 28  GLUCOSE 178* 125*  BUN 9 9  CREATININE 1.33 1.36*  CALCIUM 9.0 9.0   No results found for this basename: LABPT, INR,  in the last 72 hours No results found for this basename: LABURIN,  in the last 72 hours Results for orders placed during the hospital encounter of 06/13/12  SURGICAL PCR SCREEN     Status: None   Collection Time    06/13/12 12:40 PM      Result Value Range Status   MRSA, PCR NEGATIVE  NEGATIVE Final   Staphylococcus aureus NEGATIVE  NEGATIVE Final   Comment:            The Xpert SA Assay (FDA     approved for NASAL specimens     in patients over 8 years of age),     is one component of     a comprehensive surveillance     program.  Test performance has     been validated by [employer] for patients greater     than or equal to 75 year old.     It is not intended     to diagnose infection nor to     guide or monitor treatment.  URINE CULTURE     Status: None    Collection Time    06/13/12  5:03 PM      Result Value Range Status   Specimen Description URINE, RANDOM   Final   Special Requests Normal   Final   Culture  Setup Time 06/13/2012 22:39   Final   Colony Count NO GROWTH   Final   Culture NO GROWTH   Final   Report Status 06/14/2012 FINAL   Final    Studies/Results: No results found.  Assessment/Plan:  Satisfactory early post op.  Advance diet as tolerated  Ambulate with assistance   LOS: 2 days   Chad Olson 06/15/2012, 1:31 PM

## 2012-06-15 NOTE — Op Note (Signed)
NAME:  Chad Olson, Chad Olson NO.:  [id number]  MEDICAL RECORD NO.:  [id number]  LOCATION:  1407                         FACILITY:  [facility]  PHYSICIAN:  Sebastian Ache, MD     DATE OF BIRTH:  07-May-1945  DATE OF PROCEDURE:  06/14/2012 DATE OF DISCHARGE:                              OPERATIVE REPORT   DIAGNOSIS:  Right ureteral stricture, left ureteral urinoma.  PROCEDURES: 1. Open bilateral ureteral reimplantation into ileal conduit. 2. Extensive adhesiolysis. 3. Implantation of fascial pain pump.  ASSISTANT:  Pecola Leisure, PA.  ESTIMATED BLOOD LOSS:  300 mL.  COMPLICATIONS:  None.  SPECIMEN:  None.  FINDINGS: 1. Apparent short segment right ureteral stricture at previous     ureteroenteric anastomosis. 2. Distal end of previous left nephroureteral stent and apparent     urinoma with connection to the butt end of the ileal conduit. 3. Expected retroperitoneal adhesions consistent with prior surgery.  DRAINS: 1. Leyba-Pratt drain to bulb suction. 2. Right-sided bander ureteral stent, red in color, left-sided     ureteral bander stent, blue in color.  INDICATION:  Chad Olson is a very pleasant 67 year old gentleman who underwent robotic-assisted cystoprostatectomy with ileal conduit urinary diversion in February of 2014 for BCG refractory high-grade urothelial carcinoma.  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 Options were discussed for management including an open revision versus chronic nephrostomy tubes.  He whished to proceed with revision.  Informed consent was obtained and placed in the medical record.  The patient's most  recent duplex ultrasound study revealed resolution of his bilateral DVTs.  Most recent urine cultures were without growth.  His most recent blood cultures were without growth.  Informed consent was obtained and placed in the medical record.  PROCEDURE IN DETAIL:  The patient being Chad Olson, was verified. Procedure being open ureteroenteric anastomosis.  Reimplant was confirmed.  Procedure was carried out.  Time-out was performed. Intravenous antibiotics were administered.  General endotracheal anesthesia was introduced.  The patient was placed into a supine position.  Sterile field was created by prepping and draping the patient's infraxiphoid abdomen using chlorhexidine gluconate.  His penis, perineum and proximal thighs using iodine x3.  Foley catheter was placed per urethra except for the area of his previous conduit site, which was prepped using Betadine.  A linear incision was made from position approximately 2 fingerbreadths above the umbilicus to the area of the pubic bone in the midline.  Incision was carried down using a cautery to the area of the external fascia, which was then carefully incised.  Peritoneum was incised using Metzenbaum scissors.  The Omni- Tract retractor was carefully applied sweeping the descending colon to the right of midline exposing the  area of the right side of the abdomen and the area of the expected anastomosis.  His conduit was inspected throughout its intra-abdominal portion and found to be pink and patent without evidence of obvious ischemia.  This was traced back to its proximal end.  The right ureter was easily seen in this location and appears somewhat dilated down to a very focal area at the level of insertion consistent with ureteroenteric stricture.  This was marked with vessel loop and set aside.  The left-sided ureteral conduit anastomosis was significant more difficult to identify.   As such the proximal end of the conduit was very  carefully and sequentially mobilized and the left ureter was visualized as was the previously applied nephroureteral stent.  Distal end appeared to be within the very small urinoma cavity. This was entered the most distal end of the ureter, this location appeared to be fibrotic and was very carefully traced proximally for distance of about 7 cm.  All, but the most distal end of the ureter appeared to be of normal caliber and pink and well vascularized.  The expected periureteral fibrotic reaction, which required extensive Adhesiolysis, taking great care to avoid injury to the ureteral blood supply, which did not occur.  The geometry of the plan, reimplantation was laid down and it appeared that both ureters to be nicely without tension.  The previous urinoma that communicated to the butt end of the conduit was oversewn x2 with vicryl.  The previous right ureteral anastomosis was excised proximal to the level of the apparent stricture.  This was oversewn on the conduit side.  The ureter appeared to be of excellent caliber for anastomosis as it was somewhat dilated.  There was copious urine efflux that was clear from this right side.  Attention was initially directed at the right reimplantation, a suitable position on the posterior aspect of the conduit was selected as a lay in direct approximation to the ureter without tension.  Four single 4-0 Vicryl sutures were used to imbricate the mucosa, easily overlied the serosa of the bowel.  Again, the right ureter had been previously transected, appeared to be of excellent caliber and with a bleeding edge suggestive of excellent blood supply.  The Corner stitch applied using single 3-0 Monocryl and the and red colored Bander stent was carefully advanced to approximately 20 cm to the anastomosis and deployed.  Back wall anastomosis was performed using a single suture line of running 4-0 Vicryl.  Taking great care to ensure  mucosa-to-mucosa anastomosis and no undue tension.  The anterior wall was similarly performed using separate suture line of running 4-0 Vicryl.  This was irrigated retrograde and was found to be water tight.  Attention was directed to the left side.  Similarly, the left end of the ureter appeared to be excellently vascularized and was spatulated for distance of approximately 6 mm with suitable position of the bowel on the anterior surface lay in tension-free apposition to this and it was carefully incised in similar imbricated as the right side using an interrupted Vicryl.  The blue color Bander stent was employed across this area to a distance of approximately 20 cm.  Anchor stitch was applied using 3-0 Monocryl and back wall where similar anastomosis to the right side using separate suture lines of the 4-0 Vicryl. Additional test of watertight incision was performed by filling the conduit retrograde and this was found to be water tight.  Hemostasis appeared excellent.  Anastomoses appeared to be of excellent caliber vascularity as did the entire length of the conduit.  Retractors were removed and no obvious injury of this area had reoccurred.  Fascia was reapproximated using looped PDS with intervening figure-of-eight 0 Vicryl every fourth  stitch.  ON-Q fascial pain pump was brought through the inferior most aspect of the incision and lay directly on the fascia. The Ell[place]e Sia was reapproximated above this using loose interrupted Vicryl. The skin was reapproximated using stapling technique.  The blue fascia was closed after a closed suction drain was brought through at the right lower quadrant, the area not in direct approximation to the anastomoses.  Sponge and needle counts were correct.  Ostomy appliance was applied.  Procedure was then terminated. The patient tolerated the procedure well.  There were no immediate periprocedural complications.  The patient was taken to  the postanesthesia care unit in stable condition.          ______________________________ Sebastian Ache, MD     TM/MEDQ  D:  06/14/2012  T:  06/15/2012  Job:  [id number]

## 2012-06-15 NOTE — Consult Note (Signed)
WOC ostomy consult: Patient well known to me from previous admissions. Wife at bedside. Both eager to relay difficulties and frustrations experienced over the past 4 months. Stoma type/location: RLQ urostomy stoma with two stents, one red (right) one blue (left) ureter Stomal assessment/size: 1 inch x 1 and 1/2 inches oval, slightly budded with a suture holding stents in place Peristomal assessment: intact, clear Treatment options for stomal/peristomal skin: none indicated Output clear yellow urine Ostomy pouching: 1pc. Convex pouching system with a small (very small) amount of paste applied to the back of the pouching system.  Stents fed through aperture into pouch.  I will follow along with you. Thanks, Ladona Mow, MSN, RN, [facility], CWOCN [phone])

## 2012-06-15 NOTE — Progress Notes (Signed)
ANTIBIOTIC CONSULT NOTE - FOLLOW UP  Pharmacy Consult for Vancomycin Indication: MRSA Bacteremia  No Known Allergies  Patient Measurements: Height: 5\' 11"  (180.3 cm) Weight: 227 lb 8 oz (103.193 kg) IBW/kg (Calculated) : 75.3 Adjusted Body Weight:   Vital Signs: Temp: 97.9 F (36.6 C) (06/14 0616) Temp src: Oral (06/14 0616) BP: 138/86 mmHg (06/14 0616) Pulse Rate: 82 (06/14 0616) Intake/Output from previous day: 06/13 0701 - 06/14 0700 In: 3891.3 [I.V.:3241.3; IV Piggyback:650] Out: 3385 [Urine:2935; Drains:150; Blood:300] Intake/Output from this shift: Total I/O In: 300 [IV Piggyback:300] Out: 1600 [Urine:1500; Drains:100]  Labs:  Recent Labs  06/13/12 1710 06/14/12 1409 06/15/12 0500  WBC 6.0  --   --   HGB 12.2* 13.1 11.1*  PLT 265  --   --   CREATININE 1.25 1.33 1.36*   Estimated Creatinine Clearance: 65.4 ml/min (by C-G formula based on Cr of 1.36).  Recent Labs  06/15/12 0500  VANCORANDOM 17.8     Microbiology: Recent Results (from the past 720 hour(s))  URINE CULTURE     Status: None   Collection Time    06/02/12  3:35 PM      Result Value Range Status   Specimen Description URINE, CLEAN CATCH   Final   Special Requests NONE   Final   Culture  Setup Time 06/03/2012 03:37   Final   Colony Count NO GROWTH   Final   Culture NO GROWTH   Final   Report Status 06/04/2012 FINAL   Final  CULTURE, BLOOD (ROUTINE X 2)     Status: None   Collection Time    06/02/12  4:04 PM      Result Value Range Status   Specimen Description BLOOD LEFT HAND   Final   Special Requests BOTTLES DRAWN AEROBIC ONLY 2CC   Final   Culture  Setup Time 06/02/2012 21:12   Final   Culture NO GROWTH 5 DAYS   Final   Report Status 06/08/2012 FINAL   Final  CULTURE, BLOOD (ROUTINE X 2)     Status: None   Collection Time    06/02/12  4:25 PM      Result Value Range Status   Specimen Description BLOOD LEFT HAND  2 ML IN [facility] BOTTLE   Final   Special Requests NONE   Final   Culture  Setup Time 06/02/2012 21:13   Final   Culture NO GROWTH 5 DAYS   Final   Report Status 06/08/2012 FINAL   Final  SURGICAL PCR SCREEN     Status: None   Collection Time    06/13/12 12:40 PM      Result Value Range Status   MRSA, PCR NEGATIVE  NEGATIVE Final   Staphylococcus aureus NEGATIVE  NEGATIVE Final   Comment:            The Xpert SA Assay (FDA     approved for NASAL specimens     in patients over 77 years of age),     is one component of     a comprehensive surveillance     program.  Test performance has     been validated by [employer] for patients greater     than or equal to 21 year old.     It is not intended     to diagnose infection nor to     guide or monitor treatment.  URINE CULTURE     Status: None   Collection  Time    06/13/12  5:03 PM      Result Value Range Status   Specimen Description URINE, RANDOM   Final   Special Requests Normal   Final   Culture  Setup Time 06/13/2012 22:39   Final   Colony Count NO GROWTH   Final   Culture NO GROWTH   Final   Report Status 06/14/2012 FINAL   Final    Anti-infectives   Start     Dose/Rate Route Frequency Ordered Stop   06/15/12 0800  vancomycin (VANCOCIN) 1,500 mg in sodium chloride 0.9 % 500 mL IVPB  Status:  Discontinued     1,500 mg 250 mL/hr over 120 Minutes Intravenous Every 24 hours 06/14/12 1623 06/14/12 1743   06/15/12 0800  vancomycin (VANCOCIN) 1,250 mg in sodium chloride 0.9 % 250 mL IVPB     1,250 mg 166.7 mL/hr over 90 Minutes Intravenous Every 12 hours 06/15/12 0636     06/14/12 1600  vancomycin (VANCOCIN) IVPB 1000 mg/200 mL premix     1,000 mg 200 mL/hr over 60 Minutes Intravenous Every 24 hours 06/14/12 1545 06/14/12 1807   06/14/12 1600  ampicillin-sulbactam (UNASYN) 1.5 g in sodium chloride 0.9 % 50 mL IVPB     1.5 g 100 mL/hr over 30 Minutes Intravenous Every 6 hours 06/14/12 1545 06/15/12 0441   06/13/12 1631  piperacillin-tazobactam (ZOSYN) IVPB 3.375 g    Comments:  To  start in pre-op holding room   3.375 g 100 mL/hr over 30 Minutes Intravenous 60 min pre-op 06/13/12 1627 06/14/12 0720   06/13/12 1100  piperacillin-tazobactam (ZOSYN) IVPB 3.375 g     3.375 g 100 mL/hr over 30 Minutes Intravenous  Once 06/13/12 1048 06/13/12 1435   06/13/12 1054  vancomycin (VANCOCIN) 1,500 mg in sodium chloride 0.9 % 500 mL IVPB     1,500 mg 250 mL/hr over 120 Minutes Intravenous 120 min pre-op 06/13/12 1054 06/14/12 0738   06/13/12 1048  vancomycin (VANCOCIN) IVPB 1000 mg/200 mL premix  Status:  Discontinued     1,000 mg 200 mL/hr over 60 Minutes Intravenous 60 min pre-op 06/13/12 1048 06/13/12 1053      Assessment: Patient with MRSA Bacteremia.  On vancomycin PTA, with planned change in dose on 6/12.  Patient is post surgery with extra vancomycin given prior to surgery.  Patient's SCr has also changed recently.  Due to many confounding factors, feel best course is to resume prior dosing and check level as soon as possible.  Goal of Therapy:  Vancomycin trough level 15-20 mcg/ml  Plan:  Measure antibiotic drug levels at steady state Follow up culture results Vancomycin 1250mg  iv q12hr  Aleene Davidson Crowford 06/15/2012,6:45 AM

## 2012-06-16 LAB — BASIC METABOLIC PANEL
BUN: 7 mg/dL (ref 6–23)
Chloride: 105 mEq/L (ref 96–112)
Creatinine, Ser: 1.27 mg/dL (ref 0.50–1.35)
GFR calc Af Amer: 66 mL/min — ABNORMAL LOW (ref >90)

## 2012-06-16 LAB — HEMOGLOBIN AND HEMATOCRIT, BLOOD: Hemoglobin: 10.8 g/dL — ABNORMAL LOW (ref 13.0–17.0)

## 2012-06-16 NOTE — Progress Notes (Signed)
2 Days Post-Op Subjective: Patient reports Incisional pain.  Had bowel movements yesterday and today.  Objective: Vital signs in last 24 hours: Temp:  [97.4 F (36.3 C)-98.8 F (37.1 C)] 97.4 F (36.3 C) (06/15 0500) Pulse Rate:  [65-88] 83 (06/15 0500) Resp:  [16] 16 (06/15 0500) BP: (136-152)/(64-93) 151/85 mmHg (06/15 0500) SpO2:  [96 %-100 %] 97 % (06/15 0500)  Intake/Output from previous day: 06/14 0701 - 06/15 0700 In: 2910 [P.O.:840; I.V.:1820; IV Piggyback:250] Out: 4880 [Urine:4625; Drains:255] Intake/Output this shift: Total I/O In: -  Out: 625 [Urine:600; Drains:25]  Physical Exam:  General: Abdomen: Soft, non distended.  Wound clean. Stoma : OK. Ileal conduit draining clear urine.  Stents in place JP drainage: 255 cc. Urinary output: 4625 cc Hgb: 10.8   Hct: 34.8 BUN: 7  Creat: 1.27 Lab Results:  Recent Labs  06/14/12 1409 06/15/12 0500 06/16/12 0345  HGB 13.1 11.1* 10.8*  HCT 39.8 34.5* 34.8*   BMET  Recent Labs  06/15/12 0500 06/16/12 0345  NA 141 140  K 3.2* 3.3*  CL 106 105  CO2 28 29  GLUCOSE 125* 130*  BUN 9 7  CREATININE 1.36* 1.27  CALCIUM 9.0 9.0   No results found for this basename: LABPT, INR,  in the last 72 hours No results found for this basename: LABURIN,  in the last 72 hours Results for orders placed during the hospital encounter of 06/13/12  SURGICAL PCR SCREEN     Status: None   Collection Time    06/13/12 12:40 PM      Result Value Range Status   MRSA, PCR NEGATIVE  NEGATIVE Final   Staphylococcus aureus NEGATIVE  NEGATIVE Final   Comment:            The Xpert SA Assay (FDA     approved for NASAL specimens     in patients over 52 years of age),     is one component of     a comprehensive surveillance     program.  Test performance has     been validated by [employer] for patients greater     than or equal to 55 year old.     It is not intended     to diagnose infection nor to     guide or monitor  treatment.  URINE CULTURE     Status: None   Collection Time    06/13/12  5:03 PM      Result Value Range Status   Specimen Description URINE, RANDOM   Final   Special Requests Normal   Final   Culture  Setup Time 06/13/2012 22:39   Final   Colony Count NO GROWTH   Final   Culture NO GROWTH   Final   Report Status 06/14/2012 FINAL   Final    Studies/Results: No results found.  Assessment/Plan:  Day  II  post ureteral-ileal conduit reanastomosis.  Ambulate with assistance.  Send JP drainage for creatinine.   LOS: 3 days   Chad Olson 06/16/2012, 8:59 AM

## 2012-06-17 ENCOUNTER — Inpatient Hospital Stay: Payer: BC Managed Care – PPO | Admitting: Internal Medicine

## 2012-06-17 ENCOUNTER — Encounter (HOSPITAL_COMMUNITY): Payer: Self-pay | Admitting: Urology

## 2012-06-17 ENCOUNTER — Telehealth: Payer: Self-pay | Admitting: *Deleted

## 2012-06-17 LAB — BASIC METABOLIC PANEL
CO2: 29 mEq/L (ref 19–32)
Chloride: 106 mEq/L (ref 96–112)
Creatinine, Ser: 1.18 mg/dL (ref 0.50–1.35)

## 2012-06-17 LAB — VANCOMYCIN, TROUGH: Vancomycin Tr: 19 ug/mL (ref 10.0–20.0)

## 2012-06-17 LAB — HEMOGLOBIN AND HEMATOCRIT, BLOOD: HCT: 33.6 % — ABNORMAL LOW (ref 39.0–52.0)

## 2012-06-17 MED ORDER — POTASSIUM CHLORIDE CRYS ER 20 MEQ PO TBCR
40.0000 meq | EXTENDED_RELEASE_TABLET | Freq: Two times a day (BID) | ORAL | Status: AC
  Administered 2012-06-17 (×2): 40 meq via ORAL
  Filled 2012-06-17 (×2): qty 2

## 2012-06-17 NOTE — Telephone Encounter (Signed)
Patient wife called and advised he is inpatient again and should be getting out this week sometime. She wants Dr Daiva Eves to call her if at all possible she had some questions for him. Her most pressing question is if they should continue to use the Vanc he was using prior to this admission. Advised the patient her doctor is not in the office at this time and will have to send him a message and wait for his response.

## 2012-06-17 NOTE — Progress Notes (Signed)
ANTIBIOTIC CONSULT NOTE - FOLLOW UP  Pharmacy Consult for Vancomycin Indication: MRSA Bacteremia (continued from PTA)  No Known Allergies  Patient Measurements: Height: 5\' 11"  (180.3 cm) Weight: 227 lb 8 oz (103.193 kg) IBW/kg (Calculated) : 75.3  Vital Signs: Temp: 97.7 F (36.5 C) (06/16 1344) Temp src: Oral (06/16 1344) BP: 142/78 mmHg (06/16 1344) Pulse Rate: 89 (06/16 1344) Intake/Output from previous day: 06/15 0701 - 06/16 0700 In: 2480 [P.O.:480; I.V.:1500; IV Piggyback:500] Out: 3865 [Urine:3725; Drains:140] Intake/Output from this shift: Total I/O In: 640 [P.O.:640] Out: 1300 [Urine:1300]  Labs:  Recent Labs  06/15/12 0500 06/16/12 0345 06/17/12 0408  HGB 11.1* 10.8* 10.8*  CREATININE 1.36* 1.27 1.18   Estimated Creatinine Clearance: 75.3 ml/min (by C-G formula based on Cr of 1.18).  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 Recent hospitalizations for serratia pyelonephritis as well as staph aureus (MRSA) bacteremia on IV vancomycin through 6/24 per ID then plan for PO doxy.  Patient was admitted for revision of bilateral ureteral-conduit anastomoses 6/13 and Vancomycin resumed post-op (pt also received Vancomycin in the OR).   Recent cultures as follow:   5/12 blood x 2 >> MRSA  5/12 urine >> serratia liquefeciens 5/14 blood x 2 >> NGF 6/1 urine >> NGF 6/1 blood x 2 >> NGF 6/12 urine >> NGF  Patient's is afebrile, WBC stable (10.8K), Scr improved to 1.18 for CG CrCl 75 ml/min and normalized Crcl 64 ml/min. Plan discharge home tomorrow - given improved renal function - pharmacy will check vancomycin trough to ensure patient will be sent out on appropriate Vancomycin dose.   Goal of Therapy:   Vancomycin trough level 15-20 mcg/ml  Plan:   Continue Vancomycin 1250 mg IV q12h for now.    Vancomycin trough prior to 2000 dose tonight  Pharmacy will f/u   Geoffry Paradise, PharmD, BCPS Pager: [phone] 2:35 PM Pharmacy #: [id number]

## 2012-06-17 NOTE — Progress Notes (Signed)
3 Days Post-Op  Subjective:  1 - Revision Bilateral Ureteral-Conduit Anastamoses- s/p open bilateral ureteral reimplant with adhesiolysis and implantation of fascial pain pump 06/14/2012. JP Cr 6/15 same as serum.  2 - Bacteruria - Pt with MRSA in urine by hospital UCX 03/2012 and again subsequently in blood. Now on Vancomycin daily per ID x 4-6 week total (nearing end of course).  No events overnight. Ambulatory, tolerating diet. Pain controlled.   Objective: Vital signs in last 24 hours: Temp:  [97.8 F (36.6 C)-98.5 F (36.9 C)] 98.3 F (36.8 C) (06/16 0437) Pulse Rate:  [74-102] 74 (06/16 0437) Resp:  [20-22] 20 (06/16 0437) BP: (131-157)/(62-92) 157/92 mmHg (06/16 0437) SpO2:  [97 %] 97 % (06/16 0437) Last BM Date: 06/16/12  Intake/Output from previous day: 06/15 0701 - 06/16 0700 In: 2480 [P.O.:480; I.V.:1500; IV Piggyback:500] Out: 3865 [Urine:3725; Drains:140] Intake/Output this shift:    General appearance: alert, cooperative, appears stated age and wife at bedside Head: Normocephalic, without obvious abnormality, atraumatic Eyes: conjunctivae/corneas clear. PERRL, EOM's intact. Fundi benign. Ears: normal TM's and external ear canals both ears Nose: Nares normal. Septum midline. Mucosa normal. No drainage or sinus tenderness. Throat: lips, mucosa, and tongue normal; teeth and gums normal Neck: no adenopathy, no carotid bruit, no JVD, supple, symmetrical, trachea midline and thyroid not enlarged, symmetric, no tenderness/mass/nodules Back: symmetric, no curvature. ROM normal. No CVA tenderness. Resp: clear to auscultation bilaterally Chest wall: no tenderness Cardio: regular rate and rhythm, S1, S2 normal, no murmur, click, rub or gallop GI: soft, non-tender; bowel sounds normal; no masses,  no organomegaly and Midline incision site c/d/i with staples. JP removed. fascial pain pump catheer removed. Both inspected and intact and disarded. Conduit pin/patent with distal  bander stents in situ. Male genitalia: normal Extremities: extremities normal, atraumatic, no cyanosis or edema and PICC c/d/i wihtotu erythema or cords.  Lab Results:   Recent Labs  06/16/12 0345 06/17/12 0408  HGB 10.8* 10.8*  HCT 34.8* 33.6*   BMET  Recent Labs  06/16/12 0345 06/17/12 0408  NA 140 142  K 3.3* 3.0*  CL 105 106  CO2 29 29  GLUCOSE 130* 123*  BUN 7 6  CREATININE 1.27 1.18  CALCIUM 9.0 8.8   PT/INR No results found for this basename: LABPROT, INR,  in the last 72 hours ABG No results found for this basename: PHART, PCO2, PO2, HCO3,  in the last 72 hours  Studies/Results: No results found.  Anti-infectives: Anti-infectives   Start     Dose/Rate Route Frequency Ordered Stop   06/15/12 2000  vancomycin (VANCOCIN) 1,250 mg in sodium chloride 0.9 % 250 mL IVPB     1,250 mg 166.7 mL/hr over 90 Minutes Intravenous Every 12 hours 06/15/12 1803     06/15/12 0800  vancomycin (VANCOCIN) 1,500 mg in sodium chloride 0.9 % 500 mL IVPB  Status:  Discontinued     1,500 mg 250 mL/hr over 120 Minutes Intravenous Every 24 hours 06/14/12 1623 06/14/12 1743   06/15/12 0800  vancomycin (VANCOCIN) 1,250 mg in sodium chloride 0.9 % 250 mL IVPB  Status:  Discontinued     1,250 mg 166.7 mL/hr over 90 Minutes Intravenous Every 12 hours 06/15/12 0636 06/15/12 1049   06/14/12 1600  vancomycin (VANCOCIN) IVPB 1000 mg/200 mL premix     1,000 mg 200 mL/hr over 60 Minutes Intravenous Every 24 hours 06/14/12 1545 06/14/12 1807   06/14/12 1600  ampicillin-sulbactam (UNASYN) 1.5 g in sodium chloride 0.9 % 50 mL  IVPB     1.5 g 100 mL/hr over 30 Minutes Intravenous Every 6 hours 06/14/12 1545 06/15/12 0441   06/13/12 1631  piperacillin-tazobactam (ZOSYN) IVPB 3.375 g    Comments:  To start in pre-op holding room   3.375 g 100 mL/hr over 30 Minutes Intravenous 60 min pre-op 06/13/12 1627 06/14/12 0720   06/13/12 1100  piperacillin-tazobactam (ZOSYN) IVPB 3.375 g     3.375 g 100  mL/hr over 30 Minutes Intravenous  Once 06/13/12 1048 06/13/12 1435   06/13/12 1054  vancomycin (VANCOCIN) 1,500 mg in sodium chloride 0.9 % 500 mL IVPB     1,500 mg 250 mL/hr over 120 Minutes Intravenous 120 min pre-op 06/13/12 1054 06/14/12 0738   06/13/12 1048  vancomycin (VANCOCIN) IVPB 1000 mg/200 mL premix  Status:  Discontinued     1,000 mg 200 mL/hr over 60 Minutes Intravenous 60 min pre-op 06/13/12 1048 06/13/12 1053      Assessment/Plan: 1 - Revision Bilateral Ureteral-Conduit Anastamoses- JP and pain pump catheters out today. SLIV. Replace K PO and check BMP tomorrow. Continue ambulation and proph lovenox  2 - Bacteruria  - remain on IV vanc per ID recs.  3 - Dispo - Likely DC home tomorrow.  [facility], Elainah Rhyne 06/17/2012

## 2012-06-17 NOTE — Progress Notes (Signed)
ANTIBIOTIC CONSULT NOTE - FOLLOW UP  Pharmacy Consult for Vancomycin Indication: MRSA bacteremia (cont. from PTA)  No Known Allergies  Patient Measurements: Height: 5\' 11"  (180.3 cm) Weight: 227 lb 8 oz (103.193 kg) IBW/kg (Calculated) : 75.3 Adjusted Body Weight:   Vital Signs: Temp: 98 F (36.7 C) (06/16 2035) Temp src: Oral (06/16 2035) BP: 143/78 mmHg (06/16 2035) Pulse Rate: 76 (06/16 2035) Intake/Output from previous day: 06/15 0701 - 06/16 0700 In: 2480 [P.O.:480; I.V.:1500; IV Piggyback:500] Out: 3865 [Urine:3725; Drains:140] Intake/Output from this shift: Total I/O In: -  Out: 900 [Urine:900]  Labs:  Recent Labs  06/15/12 0500 06/16/12 0345 06/17/12 0408  HGB 11.1* 10.8* 10.8*  CREATININE 1.36* 1.27 1.18   Estimated Creatinine Clearance: 75.3 ml/min (by C-G formula based on Cr of 1.18).  Recent Labs  06/15/12 0500 06/15/12 1705 06/17/12 1900  VANCOTROUGH  --  10.4 19.0  VANCORANDOM 17.8  --   --      Microbiology: Recent Results (from the past 720 hour(s))  URINE CULTURE     Status: None   Collection Time    06/02/12  3:35 PM      Result Value Range Status   Specimen Description URINE, CLEAN CATCH   Final   Special Requests NONE   Final   Culture  Setup Time 06/03/2012 03:37   Final   Colony Count NO GROWTH   Final   Culture NO GROWTH   Final   Report Status 06/04/2012 FINAL   Final  CULTURE, BLOOD (ROUTINE X 2)     Status: None   Collection Time    06/02/12  4:04 PM      Result Value Range Status   Specimen Description BLOOD LEFT HAND   Final   Special Requests BOTTLES DRAWN AEROBIC ONLY 2CC   Final   Culture  Setup Time 06/02/2012 21:12   Final   Culture NO GROWTH 5 DAYS   Final   Report Status 06/08/2012 FINAL   Final  CULTURE, BLOOD (ROUTINE X 2)     Status: None   Collection Time    06/02/12  4:25 PM      Result Value Range Status   Specimen Description BLOOD LEFT HAND  2 ML IN [facility] BOTTLE   Final   Special Requests NONE   Final    Culture  Setup Time 06/02/2012 21:13   Final   Culture NO GROWTH 5 DAYS   Final   Report Status 06/08/2012 FINAL   Final  SURGICAL PCR SCREEN     Status: None   Collection Time    06/13/12 12:40 PM      Result Value Range Status   MRSA, PCR NEGATIVE  NEGATIVE Final   Staphylococcus aureus NEGATIVE  NEGATIVE Final   Comment:            The Xpert SA Assay (FDA     approved for NASAL specimens     in patients over 35 years of age),     is one component of     a comprehensive surveillance     program.  Test performance has     been validated by [employer] for patients greater     than or equal to 42 year old.     It is not intended     to diagnose infection nor to     guide or monitor treatment.  URINE CULTURE     Status: None  Collection Time    06/13/12  5:03 PM      Result Value Range Status   Specimen Description URINE, RANDOM   Final   Special Requests Normal   Final   Culture  Setup Time 06/13/2012 22:39   Final   Colony Count NO GROWTH   Final   Culture NO GROWTH   Final   Report Status 06/14/2012 FINAL   Final    Anti-infectives   Start     Dose/Rate Route Frequency Ordered Stop   06/15/12 2000  vancomycin (VANCOCIN) 1,250 mg in sodium chloride 0.9 % 250 mL IVPB     1,250 mg 166.7 mL/hr over 90 Minutes Intravenous Every 12 hours 06/15/12 1803     06/15/12 0800  vancomycin (VANCOCIN) 1,500 mg in sodium chloride 0.9 % 500 mL IVPB  Status:  Discontinued     1,500 mg 250 mL/hr over 120 Minutes Intravenous Every 24 hours 06/14/12 1623 06/14/12 1743   06/15/12 0800  vancomycin (VANCOCIN) 1,250 mg in sodium chloride 0.9 % 250 mL IVPB  Status:  Discontinued     1,250 mg 166.7 mL/hr over 90 Minutes Intravenous Every 12 hours 06/15/12 0636 06/15/12 1049   06/14/12 1600  vancomycin (VANCOCIN) IVPB 1000 mg/200 mL premix     1,000 mg 200 mL/hr over 60 Minutes Intravenous Every 24 hours 06/14/12 1545 06/14/12 1807   06/14/12 1600  ampicillin-sulbactam (UNASYN) 1.5 g in  sodium chloride 0.9 % 50 mL IVPB     1.5 g 100 mL/hr over 30 Minutes Intravenous Every 6 hours 06/14/12 1545 06/15/12 0441   06/13/12 1631  piperacillin-tazobactam (ZOSYN) IVPB 3.375 g    Comments:  To start in pre-op holding room   3.375 g 100 mL/hr over 30 Minutes Intravenous 60 min pre-op 06/13/12 1627 06/14/12 0720   06/13/12 1100  piperacillin-tazobactam (ZOSYN) IVPB 3.375 g     3.375 g 100 mL/hr over 30 Minutes Intravenous  Once 06/13/12 1048 06/13/12 1435   06/13/12 1054  vancomycin (VANCOCIN) 1,500 mg in sodium chloride 0.9 % 500 mL IVPB     1,500 mg 250 mL/hr over 120 Minutes Intravenous 120 min pre-op 06/13/12 1054 06/14/12 0738   06/13/12 1048  vancomycin (VANCOCIN) IVPB 1000 mg/200 mL premix  Status:  Discontinued     1,000 mg 200 mL/hr over 60 Minutes Intravenous 60 min pre-op 06/13/12 1048 06/13/12 1053      Assessment: 67 yoM with hx of bladder ca s/p cystectomy with ileal conduit urinary diversion, urinoma/left uerteral leak/right hydronephrosis, 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 Recent hospitalizations for serratia pyelonephritis as well as staph aureus (MRSA) bacteremia on IV vancomycin through 6/24 per ID then plan for PO doxy. Patient was admitted for revision of bilateral ureteral-conduit anastomoses 6/13 and Vancomycin resumed post-op (pt also received Vancomycin in the OR).    Vanc trough= 19.0 on Vancomycin 1250 IV q 12 hours  Goal of Therapy:  Vancomycin trough level 15-20 mcg/ml  Plan:  Continue Vancomycin 1250mg  IV q 12 hours. Measure antibiotic drug levels at steady state Follow up culture results  Loletta Specter 06/17/2012,9:36 PM

## 2012-06-18 LAB — BASIC METABOLIC PANEL
CO2: 27 mEq/L (ref 19–32)
Chloride: 105 mEq/L (ref 96–112)
Creatinine, Ser: 1.12 mg/dL (ref 0.50–1.35)
Potassium: 3.4 mEq/L — ABNORMAL LOW (ref 3.5–5.1)
Sodium: 140 mEq/L (ref 135–145)

## 2012-06-18 MED ORDER — OXYCODONE-ACETAMINOPHEN 5-325 MG PO TABS: 1.0000 | ORAL_TABLET | ORAL | Status: DC | PRN

## 2012-06-18 MED ORDER — HEPARIN SOD (PORK) LOCK FLUSH 100 UNIT/ML IV SOLN
250.0000 [IU] | INTRAVENOUS | Status: DC | PRN
  Administered 2012-06-18: 250 [IU]
  Filled 2012-06-18: qty 3

## 2012-06-18 MED ORDER — SENNA-DOCUSATE SODIUM 8.6-50 MG PO TABS: 1.0000 | ORAL_TABLET | Freq: Two times a day (BID) | ORAL | Status: DC | PRN

## 2012-06-18 MED ORDER — HEPARIN SOD (PORK) LOCK FLUSH 100 UNIT/ML IV SOLN
250.0000 [IU] | Freq: Every day | INTRAVENOUS | Status: DC
  Filled 2012-06-18: qty 3

## 2012-06-18 MED ORDER — VANCOMYCIN HCL 10 G IV SOLR: 1250.0000 mg | Freq: Two times a day (BID) | INTRAVENOUS | Status: DC

## 2012-06-18 NOTE — Discharge Summary (Signed)
Physician Discharge Summary  Patient ID: Chad Olson MRN: [id number] DOB/AGE: December 15, 1945 67 y.o.  Admit date: 06/13/2012 Discharge date: 06/18/2012  Admission Diagnoses: Rt Ureteral Stricture, Lt Ureteral Leak  Discharge Diagnoses: Rt Ureteral Stricture, Lt Ureteral Leak s/p Operative Repair   Discharged Condition: good  Hospital Course:   1 - Bilateral Ureteral Reimplantation - Pt with rt ureteral stricture and Lt ureteral leak following previous cystectomy for cancer 02/2012. These failed conservative measures. Pt admitted 7/12, day prior to surgery for bowel prep and ABX. Underwent open bilateral ureteral reimplantation into ileal conduit with extensive adhesiolysis and fascial pain pump insertion on 6/13 without acute complications. By POD3, he was ambulatory, JP Cr same as serum and removed as was his pain catheter as it has run out as planned. By POD4, the day or discharge, Hgb stable, Cr stable, pt ambulatory, tolerating diet with resumed bowel function and felt to be adequate for discharge.   2 - Recent Bacteruria / Bacteremia - Pt remained on IV Vancomycin per ID recs throughout hospital stay. Most recent pre-op urine and blood CX negative. He will remain in IV ABX per ID post-op.  3 - Recent DVT/PE - most recently DVT documented resolved pre-op. Pt kept on prophylaxis dosing lovenox + SCD's per-op. He will resume full dose Lovenox at discharge. Hgb stable.  Consults: pharmacy  Significant Diagnostic Studies: labs: Hgb >10, Cr <1.5.  Treatments: antibiotics: vancomycin and ceftriaxone and surgery:  open bilateral ureteral reimplantation into ileal conduit with extensive adhesiolysis and fascial pain pump insertion on 6/13   Discharge Exam: Blood pressure 152/72, pulse 70, temperature 97.6 F (36.4 C), temperature source Oral, resp. rate 12, height 5\' 11"  (1.803 m), weight 103.193 kg (227 lb 8 oz), SpO2 98.00%. General appearance: alert, cooperative, appears stated age and  wife at bedside Head: Normocephalic, without obvious abnormality, atraumatic Eyes: conjunctivae/corneas clear. PERRL, EOM's intact. Fundi benign. Ears: normal TM's and external ear canals both ears Nose: Nares normal. Septum midline. Mucosa normal. No drainage or sinus tenderness. Throat: lips, mucosa, and tongue normal; teeth and gums normal Neck: no adenopathy, no carotid bruit, no JVD, supple, symmetrical, trachea midline and thyroid not enlarged, symmetric, no tenderness/mass/nodules Back: symmetric, no curvature. ROM normal. No CVA tenderness., Left neph tueb in place and capped. No erythema  / drainage. Resp: clear to auscultation bilaterally Chest wall: no tenderness Cardio: regular rate and rhythm, S1, S2 normal, no murmur, click, rub or gallop GI: soft, non-tender; bowel sounds normal; no masses,  no organomegaly Male genitalia: normal Extremities: extremities normal, atraumatic, no cyanosis or edema, Homans sign is negative, no sign of DVT and RUE PICC c/d/i withtou erythema at site. Pulses: 2+ and symmetric Skin: Skin color, texture, turgor normal. No rashes or lesions or Left abdomian skin excoration at prior tape site c/d/i. No drainage. Lymph nodes: Cervical, supraclavicular, and axillary nodes normal. Neurologic: Grossly normal Incision/Wound: Midline incision c/d/i with staples. Previous drain sites c/d/i with dry dressing. Ileal conduit pink / patent with bilateral bander stents in good position.  Disposition: 06-Home-Health Care Svc   Future Appointments Provider Department Dept Phone   06/27/2012 8:45 AM Judyann Munson, MD [facility] [phone]       Medication List    STOP taking these medications       acetaminophen 500 MG tablet  Commonly known as:  TYLENOL      TAKE these medications       enoxaparin 120 MG/0.8ML injection  Commonly  known as:  LOVENOX  Inject 105 mg into the skin 2 (two) times daily. Pt's doing  0.23ml     multivitamin with minerals Tabs  Take 1 tablet by mouth daily.     oxyCODONE-acetaminophen 5-325 MG per tablet  Commonly known as:  ROXICET  Take 1 tablet by mouth every 4 (four) hours as needed for pain.     pantoprazole 40 MG tablet  Commonly known as:  PROTONIX  Take 40 mg by mouth daily.     pravastatin 20 MG tablet  Commonly known as:  PRAVACHOL  Take 20 mg by mouth every evening.     PROBIOTIC DAILY PO  Take 1 capsule by mouth daily.     sennosides-docusate sodium 8.6-50 MG tablet  Commonly known as:  SENOKOT-S  Take 1 tablet by mouth 2 (two) times daily as needed for constipation. While taking pain meds.     sodium chloride 0.9 % SOLN 250 mL with vancomycin 10 G SOLR  Inject 1,250 mg into the vein daily.           Follow-up Information   Follow up with Sebastian Ache, MD On 06/28/2012. (1pm for MD visit)    Contact information:   [address] [place] [address] [phone]       Signed: Sebastian Ache 06/18/2012, 7:44 AM

## 2012-06-19 ENCOUNTER — Encounter: Payer: Self-pay | Admitting: Internal Medicine

## 2012-06-22 ENCOUNTER — Emergency Department (HOSPITAL_COMMUNITY): Payer: BC Managed Care – PPO

## 2012-06-22 ENCOUNTER — Encounter (HOSPITAL_COMMUNITY): Payer: Self-pay | Admitting: *Deleted

## 2012-06-22 ENCOUNTER — Inpatient Hospital Stay (HOSPITAL_COMMUNITY)
Admission: EM | Admit: 2012-06-22 | Discharge: 2012-08-09 | DRG: 582 | Disposition: A | Discharge status: Home Health | Payer: BC Managed Care – PPO | Attending: Urology | Admitting: Urology

## 2012-06-22 DIAGNOSIS — R110 Nausea: Secondary | ICD-10-CM

## 2012-06-22 DIAGNOSIS — E785 Hyperlipidemia, unspecified: Secondary | ICD-10-CM | POA: Diagnosis present

## 2012-06-22 DIAGNOSIS — R1319 Other dysphagia: Secondary | ICD-10-CM

## 2012-06-22 DIAGNOSIS — K6289 Other specified diseases of anus and rectum: Secondary | ICD-10-CM | POA: Diagnosis present

## 2012-06-22 DIAGNOSIS — D131 Benign neoplasm of stomach: Secondary | ICD-10-CM | POA: Diagnosis present

## 2012-06-22 DIAGNOSIS — I8290 Acute embolism and thrombosis of unspecified vein: Secondary | ICD-10-CM

## 2012-06-22 DIAGNOSIS — Z6830 Body mass index (BMI) 30.0-30.9, adult: Secondary | ICD-10-CM

## 2012-06-22 DIAGNOSIS — I2699 Other pulmonary embolism without acute cor pulmonale: Secondary | ICD-10-CM

## 2012-06-22 DIAGNOSIS — R1310 Dysphagia, unspecified: Secondary | ICD-10-CM | POA: Diagnosis present

## 2012-06-22 DIAGNOSIS — R627 Adult failure to thrive: Secondary | ICD-10-CM | POA: Diagnosis present

## 2012-06-22 DIAGNOSIS — D72829 Elevated white blood cell count, unspecified: Secondary | ICD-10-CM | POA: Diagnosis present

## 2012-06-22 DIAGNOSIS — I2782 Chronic pulmonary embolism: Secondary | ICD-10-CM | POA: Diagnosis present

## 2012-06-22 DIAGNOSIS — B370 Candidal stomatitis: Secondary | ICD-10-CM | POA: Diagnosis not present

## 2012-06-22 DIAGNOSIS — A498 Other bacterial infections of unspecified site: Secondary | ICD-10-CM

## 2012-06-22 DIAGNOSIS — E876 Hypokalemia: Secondary | ICD-10-CM

## 2012-06-22 DIAGNOSIS — IMO0002 Reserved for concepts with insufficient information to code with codable children: Principal | ICD-10-CM | POA: Diagnosis present

## 2012-06-22 DIAGNOSIS — I10 Essential (primary) hypertension: Secondary | ICD-10-CM

## 2012-06-22 DIAGNOSIS — Z79899 Other long term (current) drug therapy: Secondary | ICD-10-CM

## 2012-06-22 DIAGNOSIS — Z436 Encounter for attention to other artificial openings of urinary tract: Secondary | ICD-10-CM

## 2012-06-22 DIAGNOSIS — R112 Nausea with vomiting, unspecified: Secondary | ICD-10-CM

## 2012-06-22 DIAGNOSIS — E119 Type 2 diabetes mellitus without complications: Secondary | ICD-10-CM | POA: Diagnosis present

## 2012-06-22 DIAGNOSIS — K560 Paralytic ileus: Secondary | ICD-10-CM | POA: Diagnosis present

## 2012-06-22 DIAGNOSIS — K219 Gastro-esophageal reflux disease without esophagitis: Secondary | ICD-10-CM

## 2012-06-22 DIAGNOSIS — Z7901 Long term (current) use of anticoagulants: Secondary | ICD-10-CM

## 2012-06-22 DIAGNOSIS — I82409 Acute embolism and thrombosis of unspecified deep veins of unspecified lower extremity: Secondary | ICD-10-CM | POA: Diagnosis present

## 2012-06-22 DIAGNOSIS — Z86718 Personal history of other venous thrombosis and embolism: Secondary | ICD-10-CM

## 2012-06-22 DIAGNOSIS — B961 Klebsiella pneumoniae [K. pneumoniae] as the cause of diseases classified elsewhere: Secondary | ICD-10-CM | POA: Diagnosis present

## 2012-06-22 DIAGNOSIS — Z96659 Presence of unspecified artificial knee joint: Secondary | ICD-10-CM

## 2012-06-22 DIAGNOSIS — K649 Unspecified hemorrhoids: Secondary | ICD-10-CM | POA: Diagnosis present

## 2012-06-22 DIAGNOSIS — C679 Malignant neoplasm of bladder, unspecified: Secondary | ICD-10-CM

## 2012-06-22 DIAGNOSIS — A0472 Enterocolitis due to Clostridium difficile, not specified as recurrent: Secondary | ICD-10-CM | POA: Diagnosis not present

## 2012-06-22 DIAGNOSIS — Y838 Other surgical procedures as the cause of abnormal reaction of the patient, or of later complication, without mention of misadventure at the time of the procedure: Secondary | ICD-10-CM | POA: Diagnosis present

## 2012-06-22 DIAGNOSIS — R630 Anorexia: Secondary | ICD-10-CM

## 2012-06-22 DIAGNOSIS — N1330 Unspecified hydronephrosis: Secondary | ICD-10-CM

## 2012-06-22 DIAGNOSIS — E43 Unspecified severe protein-calorie malnutrition: Secondary | ICD-10-CM

## 2012-06-22 DIAGNOSIS — K224 Dyskinesia of esophagus: Secondary | ICD-10-CM | POA: Diagnosis present

## 2012-06-22 DIAGNOSIS — K5900 Constipation, unspecified: Secondary | ICD-10-CM

## 2012-06-22 DIAGNOSIS — R197 Diarrhea, unspecified: Secondary | ICD-10-CM

## 2012-06-22 DIAGNOSIS — R5082 Postprocedural fever: Secondary | ICD-10-CM

## 2012-06-22 DIAGNOSIS — R188 Other ascites: Secondary | ICD-10-CM

## 2012-06-22 DIAGNOSIS — K56609 Unspecified intestinal obstruction, unspecified as to partial versus complete obstruction: Secondary | ICD-10-CM

## 2012-06-22 DIAGNOSIS — D62 Acute posthemorrhagic anemia: Secondary | ICD-10-CM

## 2012-06-22 DIAGNOSIS — Z8614 Personal history of Methicillin resistant Staphylococcus aureus infection: Secondary | ICD-10-CM

## 2012-06-22 DIAGNOSIS — K3184 Gastroparesis: Secondary | ICD-10-CM

## 2012-06-22 DIAGNOSIS — N501 Vascular disorders of male genital organs: Secondary | ICD-10-CM

## 2012-06-22 DIAGNOSIS — R7881 Bacteremia: Secondary | ICD-10-CM

## 2012-06-22 DIAGNOSIS — K567 Ileus, unspecified: Secondary | ICD-10-CM

## 2012-06-22 DIAGNOSIS — R509 Fever, unspecified: Secondary | ICD-10-CM

## 2012-06-22 HISTORY — DX: Vascular disorders of male genital organs: N50.1

## 2012-06-22 LAB — CBC WITH DIFFERENTIAL/PLATELET
Basophils Relative: 0 % (ref 0–1)
Eosinophils Absolute: 0.2 10*3/uL (ref 0.0–0.7)
HCT: 35.5 % — ABNORMAL LOW (ref 39.0–52.0)
Hemoglobin: 11.6 g/dL — ABNORMAL LOW (ref 13.0–17.0)
Lymphs Abs: 2.1 10*3/uL (ref 0.7–4.0)
MCH: 29.5 pg (ref 26.0–34.0)
MCHC: 32.7 g/dL (ref 30.0–36.0)
Monocytes Absolute: 0.9 10*3/uL (ref 0.1–1.0)
Monocytes Relative: 10 % (ref 3–12)
Neutro Abs: 6.6 10*3/uL (ref 1.7–7.7)
RBC: 3.93 MIL/uL — ABNORMAL LOW (ref 4.22–5.81)

## 2012-06-22 LAB — CG4 I-STAT (LACTIC ACID): Lactic Acid, Venous: 1.49 mmol/L (ref 0.5–2.2)

## 2012-06-22 LAB — COMPREHENSIVE METABOLIC PANEL
Albumin: 3.1 g/dL — ABNORMAL LOW (ref 3.5–5.2)
Alkaline Phosphatase: 65 U/L (ref 39–117)
BUN: 7 mg/dL (ref 6–23)
Chloride: 102 mEq/L (ref 96–112)
Creatinine, Ser: 1 mg/dL (ref 0.50–1.35)
GFR calc Af Amer: 89 mL/min — ABNORMAL LOW (ref >90)
Glucose, Bld: 147 mg/dL — ABNORMAL HIGH (ref 70–99)
Total Bilirubin: 0.3 mg/dL (ref 0.3–1.2)

## 2012-06-22 MED ORDER — ONDANSETRON HCL 4 MG/2ML IJ SOLN
4.0000 mg | Freq: Once | INTRAMUSCULAR | Status: AC
  Administered 2012-06-22: 4 mg via INTRAVENOUS
  Filled 2012-06-22: qty 2

## 2012-06-22 MED ORDER — SODIUM CHLORIDE 0.9 % IV SOLN
Freq: Once | INTRAVENOUS | Status: AC
  Administered 2012-06-22: 23:00:00 via INTRAVENOUS

## 2012-06-22 MED ORDER — ACETAMINOPHEN 325 MG PO TABS
650.0000 mg | ORAL_TABLET | Freq: Once | ORAL | Status: AC
  Administered 2012-06-22: 650 mg via ORAL
  Filled 2012-06-22: qty 2

## 2012-06-22 MED ORDER — HYDROMORPHONE HCL PF 1 MG/ML IJ SOLN
1.0000 mg | Freq: Once | INTRAMUSCULAR | Status: AC
  Administered 2012-06-22: 1 mg via INTRAVENOUS
  Filled 2012-06-22: qty 1

## 2012-06-22 MED ORDER — WITCH HAZEL-GLYCERIN EX PADS
MEDICATED_PAD | CUTANEOUS | Status: DC | PRN
  Administered 2012-06-22 – 2012-06-23 (×3): via TOPICAL
  Filled 2012-06-22 (×2): qty 100

## 2012-06-22 MED ORDER — IOHEXOL 300 MG/ML  SOLN
50.0000 mL | Freq: Once | INTRAMUSCULAR | Status: AC | PRN
  Administered 2012-06-22: 50 mL via ORAL

## 2012-06-22 NOTE — ED Notes (Signed)
Pt c/o of abd pain.  Dondra Spry, NP made aware.

## 2012-06-22 NOTE — ED Notes (Signed)
Per pt's family, pt had 101.7 fever earlier today,  Recently had surgery last week Friday.  Started to have fever today.  Pt also reports pain from the incision area.  Reports redness but denies noticing any drainage.

## 2012-06-23 ENCOUNTER — Inpatient Hospital Stay (HOSPITAL_COMMUNITY): Payer: BC Managed Care – PPO

## 2012-06-23 LAB — URINALYSIS, ROUTINE W REFLEX MICROSCOPIC
Glucose, UA: NEGATIVE mg/dL
Ketones, ur: 15 mg/dL — AB
Nitrite: NEGATIVE
Protein, ur: 100 mg/dL — AB
Urobilinogen, UA: 0.2 mg/dL (ref 0.0–1.0)

## 2012-06-23 LAB — URINE MICROSCOPIC-ADD ON

## 2012-06-23 LAB — CBC
Platelets: 360 10*3/uL (ref 150–400)
RBC: 3.55 MIL/uL — ABNORMAL LOW (ref 4.22–5.81)
RDW: 15 % (ref 11.5–15.5)
WBC: 11.5 10*3/uL — ABNORMAL HIGH (ref 4.0–10.5)

## 2012-06-23 LAB — COMPREHENSIVE METABOLIC PANEL
Albumin: 3 g/dL — ABNORMAL LOW (ref 3.5–5.2)
Alkaline Phosphatase: 61 U/L (ref 39–117)
BUN: 7 mg/dL (ref 6–23)
Chloride: 100 mEq/L (ref 96–112)
Creatinine, Ser: 0.99 mg/dL (ref 0.50–1.35)
GFR calc Af Amer: >90 mL/min (ref >90)
GFR calc non Af Amer: 83 mL/min — ABNORMAL LOW (ref >90)
Glucose, Bld: 170 mg/dL — ABNORMAL HIGH (ref 70–99)
Potassium: 3.3 mEq/L — ABNORMAL LOW (ref 3.5–5.1)
Total Bilirubin: 0.3 mg/dL (ref 0.3–1.2)

## 2012-06-23 LAB — GLUCOSE, CAPILLARY
Glucose-Capillary: 163 mg/dL — ABNORMAL HIGH (ref 70–99)
Glucose-Capillary: 155 mg/dL — ABNORMAL HIGH (ref 70–99)

## 2012-06-23 MED ORDER — POTASSIUM CHLORIDE 10 MEQ/100ML IV SOLN: 10.0000 meq | Freq: Once | INTRAVENOUS | Status: DC

## 2012-06-23 MED ORDER — METOPROLOL TARTRATE 12.5 MG HALF TABLET
12.5000 mg | ORAL_TABLET | Freq: Two times a day (BID) | ORAL | Status: DC
  Administered 2012-06-23 – 2012-07-02 (×20): 12.5 mg via ORAL
  Filled 2012-06-23 (×22): qty 1

## 2012-06-23 MED ORDER — RISAQUAD PO CAPS
1.0000 | ORAL_CAPSULE | Freq: Every day | ORAL | Status: DC
  Administered 2012-06-23 – 2012-07-01 (×8): 1 via ORAL
  Filled 2012-06-23 (×11): qty 1

## 2012-06-23 MED ORDER — OXYCODONE HCL 5 MG PO TABS
5.0000 mg | ORAL_TABLET | ORAL | Status: DC | PRN
  Administered 2012-06-24 – 2012-06-25 (×2): 5 mg via ORAL
  Administered 2012-06-26 (×2): 10 mg via ORAL
  Administered 2012-06-28: 5 mg via ORAL
  Filled 2012-06-23 (×2): qty 1
  Filled 2012-06-23: qty 2
  Filled 2012-06-23: qty 1
  Filled 2012-06-23: qty 2

## 2012-06-23 MED ORDER — INSULIN ASPART 100 UNIT/ML ~~LOC~~ SOLN
0.0000 [IU] | SUBCUTANEOUS | Status: DC
  Administered 2012-06-23: 13:00:00 via SUBCUTANEOUS
  Administered 2012-06-23: 3 [IU] via SUBCUTANEOUS
  Administered 2012-06-23: 2 [IU] via SUBCUTANEOUS
  Administered 2012-06-23: 3 [IU] via SUBCUTANEOUS
  Administered 2012-06-23 – 2012-06-25 (×8): 2 [IU] via SUBCUTANEOUS
  Administered 2012-06-25 – 2012-06-28 (×2): 3 [IU] via SUBCUTANEOUS
  Administered 2012-06-28: 2 [IU] via SUBCUTANEOUS
  Administered 2012-06-29: 3 [IU] via SUBCUTANEOUS
  Administered 2012-06-30 – 2012-07-01 (×6): 2 [IU] via SUBCUTANEOUS
  Administered 2012-07-02: 1 [IU] via SUBCUTANEOUS
  Administered 2012-07-02: 2 [IU] via SUBCUTANEOUS
  Administered 2012-07-02: 1 [IU] via SUBCUTANEOUS
  Administered 2012-07-04 (×2): 2 [IU] via SUBCUTANEOUS
  Administered 2012-07-04: 3 [IU] via SUBCUTANEOUS
  Administered 2012-07-05 – 2012-07-07 (×12): 2 [IU] via SUBCUTANEOUS
  Administered 2012-07-07 (×2): 3 [IU] via SUBCUTANEOUS
  Administered 2012-07-07 – 2012-07-13 (×25): 2 [IU] via SUBCUTANEOUS
  Administered 2012-07-13: 3 [IU] via SUBCUTANEOUS
  Administered 2012-07-13 – 2012-07-14 (×4): 2 [IU] via SUBCUTANEOUS
  Administered 2012-07-14: 3 [IU] via SUBCUTANEOUS
  Administered 2012-07-15: 2 [IU] via SUBCUTANEOUS

## 2012-06-23 MED ORDER — PANTOPRAZOLE SODIUM 40 MG PO TBEC
40.0000 mg | DELAYED_RELEASE_TABLET | Freq: Every day | ORAL | Status: DC
  Administered 2012-06-23 – 2012-07-01 (×9): 40 mg via ORAL
  Filled 2012-06-23 (×11): qty 1

## 2012-06-23 MED ORDER — SENNOSIDES-DOCUSATE SODIUM 8.6-50 MG PO TABS
1.0000 | ORAL_TABLET | Freq: Every evening | ORAL | Status: DC | PRN
  Filled 2012-06-23: qty 1

## 2012-06-23 MED ORDER — ONDANSETRON HCL 4 MG/2ML IJ SOLN
4.0000 mg | Freq: Once | INTRAMUSCULAR | Status: AC
  Administered 2012-06-23: 4 mg via INTRAVENOUS
  Filled 2012-06-23: qty 2

## 2012-06-23 MED ORDER — MORPHINE SULFATE 2 MG/ML IJ SOLN
2.0000 mg | INTRAMUSCULAR | Status: DC | PRN
  Administered 2012-06-23: 4 mg via INTRAVENOUS
  Administered 2012-06-26 – 2012-06-27 (×3): 2 mg via INTRAVENOUS
  Administered 2012-06-27: 4 mg via INTRAVENOUS
  Administered 2012-06-27 (×2): 2 mg via INTRAVENOUS
  Administered 2012-06-27: 4 mg via INTRAVENOUS
  Administered 2012-06-27 – 2012-06-28 (×2): 2 mg via INTRAVENOUS
  Administered 2012-06-28: 4 mg via INTRAVENOUS
  Administered 2012-06-28 (×2): 2 mg via INTRAVENOUS
  Administered 2012-06-29 – 2012-06-30 (×4): 4 mg via INTRAVENOUS
  Filled 2012-06-23: qty 1
  Filled 2012-06-23 (×2): qty 2
  Filled 2012-06-23 (×2): qty 1
  Filled 2012-06-23: qty 2
  Filled 2012-06-23 (×3): qty 1
  Filled 2012-06-23 (×2): qty 2
  Filled 2012-06-23: qty 1
  Filled 2012-06-23 (×3): qty 2
  Filled 2012-06-23 (×2): qty 1

## 2012-06-23 MED ORDER — SIMVASTATIN 10 MG PO TABS
10.0000 mg | ORAL_TABLET | Freq: Every day | ORAL | Status: DC
  Administered 2012-06-23 – 2012-07-02 (×10): 10 mg via ORAL
  Filled 2012-06-23 (×11): qty 1

## 2012-06-23 MED ORDER — PROMETHAZINE HCL 25 MG/ML IJ SOLN
25.0000 mg | Freq: Four times a day (QID) | INTRAMUSCULAR | Status: DC | PRN
Start: 2012-06-23 — End: 2012-08-09
  Administered 2012-07-02 – 2012-08-07 (×13): 25 mg via INTRAVENOUS
  Filled 2012-06-23 (×15): qty 1

## 2012-06-23 MED ORDER — PIPERACILLIN-TAZOBACTAM 3.375 G IVPB
3.3750 g | Freq: Three times a day (TID) | INTRAVENOUS | Status: DC
  Administered 2012-06-23 – 2012-06-28 (×14): 3.375 g via INTRAVENOUS
  Filled 2012-06-23 (×17): qty 50

## 2012-06-23 MED ORDER — ADULT MULTIVITAMIN W/MINERALS CH
1.0000 | ORAL_TABLET | Freq: Every day | ORAL | Status: DC
  Administered 2012-06-23 – 2012-07-01 (×8): 1 via ORAL
  Filled 2012-06-23 (×11): qty 1

## 2012-06-23 MED ORDER — ONDANSETRON HCL 4 MG/2ML IJ SOLN
4.0000 mg | INTRAMUSCULAR | Status: DC | PRN
  Administered 2012-06-26 – 2012-06-30 (×3): 4 mg via INTRAVENOUS
  Filled 2012-06-23 (×3): qty 2

## 2012-06-23 MED ORDER — SODIUM CHLORIDE 0.9 % IJ SOLN
10.0000 mL | INTRAMUSCULAR | Status: DC | PRN
  Administered 2012-06-25 – 2012-07-26 (×6): 10 mL

## 2012-06-23 MED ORDER — POTASSIUM CHLORIDE 10 MEQ/100ML IV SOLN
10.0000 meq | INTRAVENOUS | Status: AC
  Administered 2012-06-23 (×3): 10 meq via INTRAVENOUS
  Filled 2012-06-23 (×3): qty 100

## 2012-06-23 MED ORDER — ENOXAPARIN SODIUM 120 MG/0.8ML ~~LOC~~ SOLN
105.0000 mg | Freq: Two times a day (BID) | SUBCUTANEOUS | Status: DC
  Administered 2012-06-23 – 2012-06-24 (×3): 105 mg via SUBCUTANEOUS
  Filled 2012-06-23 (×5): qty 0.8

## 2012-06-23 MED ORDER — SENNOSIDES-DOCUSATE SODIUM 8.6-50 MG PO TABS
1.0000 | ORAL_TABLET | Freq: Two times a day (BID) | ORAL | Status: DC
  Administered 2012-06-23 – 2012-07-01 (×16): 1 via ORAL
  Filled 2012-06-23 (×18): qty 1

## 2012-06-23 MED ORDER — HYDROMORPHONE HCL PF 1 MG/ML IJ SOLN
1.0000 mg | Freq: Once | INTRAMUSCULAR | Status: AC
  Administered 2012-06-23: 1 mg via INTRAVENOUS
  Filled 2012-06-23: qty 1

## 2012-06-23 MED ORDER — IOHEXOL 300 MG/ML  SOLN
100.0000 mL | Freq: Once | INTRAMUSCULAR | Status: AC | PRN
  Administered 2012-06-23: 100 mL via INTRAVENOUS

## 2012-06-23 MED ORDER — SODIUM CHLORIDE 0.9 % IV SOLN
INTRAVENOUS | Status: DC
  Administered 2012-06-23 – 2012-06-24 (×2): via INTRAVENOUS

## 2012-06-23 MED ORDER — BISACODYL 10 MG RE SUPP
10.0000 mg | Freq: Two times a day (BID) | RECTAL | Status: DC
  Administered 2012-06-23 – 2012-07-01 (×16): 10 mg via RECTAL
  Filled 2012-06-23 (×16): qty 1

## 2012-06-23 MED ORDER — POTASSIUM CHLORIDE 10 MEQ/100ML IV SOLN
10.0000 meq | INTRAVENOUS | Status: AC
  Administered 2012-06-23 (×2): 10 meq via INTRAVENOUS
  Filled 2012-06-23 (×2): qty 100

## 2012-06-23 MED ORDER — METOPROLOL TARTRATE 1 MG/ML IV SOLN
5.0000 mg | Freq: Four times a day (QID) | INTRAVENOUS | Status: DC | PRN
  Administered 2012-06-23 – 2012-07-01 (×2): 5 mg via INTRAVENOUS
  Filled 2012-06-23 (×2): qty 5

## 2012-06-23 MED ORDER — VANCOMYCIN HCL 10 G IV SOLR
1250.0000 mg | Freq: Two times a day (BID) | INTRAVENOUS | Status: AC
  Administered 2012-06-23 – 2012-06-24 (×4): 1250 mg via INTRAVENOUS
  Filled 2012-06-23 (×5): qty 1250

## 2012-06-23 MED ORDER — DEXTROSE 5 % IV SOLN
1.0000 g | Freq: Once | INTRAVENOUS | Status: AC
  Administered 2012-06-23: 1 g via INTRAVENOUS
  Filled 2012-06-23: qty 10

## 2012-06-23 NOTE — ED Notes (Signed)
Pt reports return on pain in abd. Dondra Spry, NP made aware.
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
## 2012-06-23 NOTE — ED Provider Notes (Signed)
Medical screening examination/treatment/procedure(s) were performed by non-physician practitioner and as supervising physician I was immediately available for consultation/collaboration.  Alenah Sarria T Tahjai Schetter, MD 06/23/12 0635 

## 2012-06-23 NOTE — ED Notes (Signed)
Pt at CT
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
## 2012-06-23 NOTE — Progress Notes (Signed)
No acute events. Hypertension noted. Pt complains of mild bilateral lower quadrant abdominal pain that feels like constipation. No flatus or BM in 24 hours. Had some bloody hemorrhoids.  Filed Vitals:   06/23/12 0508  BP: 170/110  Pulse:   Temp:   Resp:    Gen: No acute distress CV: RRR Chest: CTA-B Abd: TTP at incision, negative signs of wound infection, Negative rebound TTP  A/P: Post-op fever. -Continue vanc and zosyn. -Follow blood and urine cultures. -F/u CXR results. -Replace potassium. -Order incentive spirometer. -Encourage ambulation. -Bowel regiment w/ PO and rectal medication. -Add metoprolol for HTN.

## 2012-06-24 ENCOUNTER — Inpatient Hospital Stay (HOSPITAL_COMMUNITY): Payer: BC Managed Care – PPO

## 2012-06-24 ENCOUNTER — Encounter (HOSPITAL_COMMUNITY): Payer: Self-pay | Admitting: General Surgery

## 2012-06-24 DIAGNOSIS — I2699 Other pulmonary embolism without acute cor pulmonale: Secondary | ICD-10-CM

## 2012-06-24 DIAGNOSIS — C679 Malignant neoplasm of bladder, unspecified: Secondary | ICD-10-CM

## 2012-06-24 DIAGNOSIS — K661 Hemoperitoneum: Secondary | ICD-10-CM

## 2012-06-24 DIAGNOSIS — E43 Unspecified severe protein-calorie malnutrition: Secondary | ICD-10-CM | POA: Diagnosis present

## 2012-06-24 LAB — GLUCOSE, CAPILLARY
Glucose-Capillary: 130 mg/dL — ABNORMAL HIGH (ref 70–99)
Glucose-Capillary: 142 mg/dL — ABNORMAL HIGH (ref 70–99)
Glucose-Capillary: 150 mg/dL — ABNORMAL HIGH (ref 70–99)

## 2012-06-24 LAB — CBC
HCT: 27.8 % — ABNORMAL LOW (ref 39.0–52.0)
Hemoglobin: 8.9 g/dL — ABNORMAL LOW (ref 13.0–17.0)
MCHC: 32 g/dL (ref 30.0–36.0)
RBC: 3.11 MIL/uL — ABNORMAL LOW (ref 4.22–5.81)

## 2012-06-24 LAB — MRSA PCR SCREENING: MRSA by PCR: NEGATIVE

## 2012-06-24 LAB — BASIC METABOLIC PANEL
BUN: 10 mg/dL (ref 6–23)
Chloride: 100 mEq/L (ref 96–112)
GFR calc Af Amer: 83 mL/min — ABNORMAL LOW (ref >90)
Glucose, Bld: 138 mg/dL — ABNORMAL HIGH (ref 70–99)
Potassium: 3.4 mEq/L — ABNORMAL LOW (ref 3.5–5.1)
Sodium: 135 mEq/L (ref 135–145)

## 2012-06-24 LAB — URINE CULTURE: Colony Count: 35000

## 2012-06-24 MED ORDER — BOOST / RESOURCE BREEZE PO LIQD
1.0000 | Freq: Two times a day (BID) | ORAL | Status: DC
  Administered 2012-06-24 – 2012-07-02 (×7): 1 via ORAL

## 2012-06-24 MED ORDER — GLUCERNA SHAKE PO LIQD
237.0000 mL | Freq: Two times a day (BID) | ORAL | Status: DC
  Administered 2012-06-24 – 2012-07-02 (×8): 237 mL via ORAL
  Filled 2012-06-24 (×19): qty 237

## 2012-06-24 MED ORDER — IOHEXOL 350 MG/ML SOLN
100.0000 mL | Freq: Once | INTRAVENOUS | Status: AC | PRN
  Administered 2012-06-24: 125 mL via INTRAVENOUS

## 2012-06-24 MED ORDER — FLUCONAZOLE IN SODIUM CHLORIDE 200-0.9 MG/100ML-% IV SOLN
200.0000 mg | INTRAVENOUS | Status: AC
  Administered 2012-06-24 – 2012-06-26 (×3): 200 mg via INTRAVENOUS
  Filled 2012-06-24 (×3): qty 100

## 2012-06-24 MED ORDER — HYDROCORTISONE 2.5 % RE CREA
TOPICAL_CREAM | Freq: Three times a day (TID) | RECTAL | Status: DC
  Administered 2012-06-24 – 2012-07-24 (×61): via RECTAL
  Administered 2012-07-24: 1 via RECTAL
  Administered 2012-07-25 – 2012-08-02 (×9): via RECTAL
  Filled 2012-06-24 (×6): qty 28.35
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
## 2012-06-24 NOTE — Progress Notes (Signed)
ANTIBIOTIC CONSULT NOTE - Follow Up  Pharmacy Consult for Vancomycin Indication: MRSA Bacteremia  No Known Allergies  Patient Measurements: Height: 5\' 11"  (180.3 cm) Weight: 220 lb 11.2 oz (100.109 kg) IBW/kg (Calculated) : 75.3  Vital Signs: Temp: 98.5 F (36.9 C) (06/23 0425) Temp src: Oral (06/23 0425) BP: 149/87 mmHg (06/23 0425) Pulse Rate: 104 (06/23 0425)  Labs:  Recent Labs  06/22/12 2243 06/23/12 0530 06/24/12 0412  WBC 9.8 11.5* 13.7*  HGB 11.6* 10.5* 8.9*  PLT 349 360 378  CREATININE 1.00 0.99 1.05   Estimated Creatinine Clearance: 83.4 ml/min (by C-G formula based on Cr of 1.05). No results found for this basename: Rolm Gala, VANCORANDOM, GENTTROUGH, GENTPEAK, GENTRANDOM, TOBRATROUGH, TOBRAPEAK, TOBRARND, AMIKACINPEAK, AMIKACINTROU, AMIKACIN,  in the last 72 hours   Microbiology: 03/25/12 blood = MRSA 05/13/12 blood = MRSA 6/22 blood: NGTD 6/22 urine: pending 6/23 mrsa pcr:  Assessment:  67 yr old male with h/o bladder cancer.  S/P surgery 1 week ago.  With complaint of fever, abdominal pain, nausea/vomiting  On Vancomycin 1500mg  IV q24h prior to admission for MRSA bacteremia (confirmed dose with [facility])  In ED received Rocephin 1gm IV X 1 ~ 4:10  IV vancomycin to continue for MRSA bacteremia and suspected post-op infection.  IV Zosyn per MD dosing also ordered.  Vancomycin trough level therapeutic with last admission on 6/16 with a regimen of Vancomycin 1250mg  IV q12h  Per 06/05/12 note from Dr. Daiva Eves - plan at that time was for patient to continue Vanco thru 06/25/12 to complete a total of 6 wks of IV Vanco  Will check Vanco trough tonight prior to 4th dose   Goal of Therapy:  Vancomycin trough level 15-20 mcg/ml  Plan:  1) Vanco trough at 20:00 tonight 2) F/U with MD about planned duration of Vanco (is plan still to end Vanco after 6/24 doses, or will therapy be extended?)  Darrol Angel, PharmD Pager:  [phone] 06/24/2012,11:28 AM

## 2012-06-24 NOTE — Progress Notes (Signed)
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 Healed well initially and sent home 6/17.  2 - Fever / h/o Bacteruria - Pt with MRSA in urine by hospital UCX 03/2012 and again subsequently in blood. Now on Vancomycin daily per ID x 4-6 week total (nearing end of course). Most recent UCX, BCX negative, New set pending from 6/22. Pt readmitted with low-grade fever 6/22 to 100.7 and malaise, no sig leukocytosis. Now on Vanc + Zosyn emirically.  3 - Heme / Recent DVT + PE -incidental PE by CT 03/2012 now on Lovenox, DVT resolved by most recent LE duplex. No recent chest imaging. Remains on treatment-dose lovenox.  4 - Pelvic Fluid Collection - Pelvic fluid collection without rim-enhancement noted on CT from ER 6/22. Appears partially simple fluid with some dependent blood by HU analysis, no active extrav of blood or urine by imaging.  5 - Hemorrhoids / Lower GI Bleed - Long h/o hemorroids. Pt with worsened / friable bleeding hemorroids on admission that are quite bothersome. No prior specific intervention.   PMH sig for obesity and left knee replacement, PE + DVT (incidental by CT 03/2012 now on Lovenox, DVT resolved by most recent LE duplex). No CV disease.   Today Tremane and family are most concerned about his hemorroids and lack of appetite. No n/v. No fevers.   Objective: Vital signs in last 24 hours: Temp:  [98.5 F (36.9 C)-98.9 F (37.2 C)] 98.5 F (36.9 C) (06/23 0425) Pulse Rate:  [99-117] 104 (06/23 0425) Resp:  [18] 18 (06/23 0425) BP: (126-151)/(71-98) 149/87 mmHg (06/23 0425) SpO2:  [98 %-99 %] 98 % (06/23 0425) Last BM  Date: 06/23/12 (Pt. having frequent stools)  Intake/Output from previous day: 06/22 0701 - 06/23 0700 In: 3466.7 [I.V.:2766.7; IV Piggyback:700] Out: 800 [Urine:800] Intake/Output this shift:    General appearance: alert, cooperative, appears stated age and wife at bedside Head: Normocephalic, without obvious abnormality, atraumatic Eyes: conjunctivae/corneas clear. PERRL, EOM's intact. Fundi benign. Ears: normal TM's and external ear canals both ears Nose: Nares normal. Septum midline. Mucosa normal. No drainage or sinus tenderness. Throat: lips, mucosa, and tongue normal; teeth and gums normal Back: symmetric, no curvature. ROM normal. No CVA tenderness., left neph tube capped.  Resp: clear to auscultation bilaterally Chest wall: no tenderness Cardio: regular rate and rhythm, S1, S2 normal, no murmur, click, rub or gallop GI: soft, non-tender; bowel sounds normal; no masses,  no organomegaly and impressive, friable external hemorrods with bruising. Male genitalia: normal Extremities: extremities normal, atraumatic, no cyanosis or edema and RUE PICC c/d/i. NO erythema. Pulses: 2+ and symmetric Skin: Skin color, texture, turgor normal. No rashes or lesions Lymph nodes: Cervical, supraclavicular, and axillary nodes normal. Neurologic: Grossly normal Incision/Wound: Recent lower midline incision c/d/i. No drainage. RLQ urostomy pink / patent with bander stents in situ. Clear yellow urine in collection bag.  Lab Results:   Recent Labs  06/23/12 0530 06/24/12 0412  WBC 11.5* 13.7*  HGB 10.5* 8.9*  HCT 32.0* 27.8*  PLT 360 378   BMET  Recent Labs  06/23/12 0530 06/24/12 0412  NA 136 135  K 3.3* 3.4*  CL 100 100  CO2 26 24  GLUCOSE 170* 138*  BUN 7 10  CREATININE 0.99 1.05  CALCIUM 8.9 9.2   PT/INR No results found for this basename: LABPROT, INR,  in the last 72 hours ABG No results found for this basename: PHART, PCO2, PO2, HCO3,  in the last 72  hours  Studies/Results: X-ray Chest Pa And Lateral   06/23/2012   *RADIOLOGY REPORT*  Clinical Data: Evaluate pneumonia  CHEST - 2 VIEW  Comparison: 06/02/2012  Findings: There is a right arm PICC line with tip in the cavoatrial junction.  Normal heart size.  No pleural effusion or edema.  No airspace consolidation identified.  IMPRESSION:  1.  No significant change from previous exam. 2.  The right arm PICC line tip is in the cavoatrial junction.   Original Report Authenticated By: Signa Kell, M.D.   Ct Abdomen Pelvis W Contrast  06/23/2012   *RADIOLOGY REPORT*  Clinical Data: Status post repair of a ureteral conduit strictures. The patient has a fever.  CT ABDOMEN AND PELVIS WITH CONTRAST  Technique:  Multidetector CT imaging of the abdomen and pelvis was performed following the standard protocol during bolus administration of intravenous contrast.  Contrast: 50mL OMNIPAQUE IOHEXOL 300 MG/ML  SOLN, OMNIPAQUE IOHEXOL 300 MG/ML  SOLN  Comparison: CT scan 06/02/2012  Findings: The lung bases are clear.  No pleural effusion or pulmonary nodule.  The liver is unremarkable.  No focal hepatic lesions.  The gallbladder is mildly distended.  Layering sludge or small gallstones are noted.  No common bile duct dilatation.  The pancreas is normal.  The spleen is normal.  The adrenal glands are normal.  The kidneys demonstrate bilateral double J ureteral catheters extending all way into the ileostomy bag.  There is also an externalized nephrostomy tube on the left.  No hydronephrosis. Bilateral renal cysts are stable.  No complicating features associated with the ureteral stents.  However, there is a large "mass "in the pelvis which is most likely a large hematoma with a hematocrit level.  No rim enhancing abscess is identified.  Cannot exclude the possibility of the hematoma is not affected.  The patient has had a cystectomy.  The rectum is compressed by the hematoma.  IMPRESSION:  1.  Large, 13 x 7.5 cm  pelvic hematoma. Smaller hematoma more anteriorly.  No obvious abscess but this certainly could be infected. 2.  Bilateral double J ureteral stents extending into the ileal conduit bag.  Externalized left nephrostomy catheter on the left.   Original Report Authenticated By: Rudie Meyer, M.D.    Anti-infectives: Anti-infectives   Start     Dose/Rate Route Frequency Ordered Stop   06/23/12 0900  vancomycin (VANCOCIN) 1,250 mg in sodium chloride 0.9 % 250 mL IVPB     1,250 mg 166.7 mL/hr over 90 Minutes Intravenous Every 12 hours 06/23/12 0522     06/23/12 0600  piperacillin-tazobactam (ZOSYN) IVPB 3.375 g     3.375 g 12.5 mL/hr over 240 Minutes Intravenous 3 times per day 06/23/12 0447     06/23/12 0330  cefTRIAXone (ROCEPHIN) 1 g in dextrose 5 % 50 mL IVPB     1 g 100 mL/hr over 30 Minutes Intravenous  Once 06/23/12 0319 06/23/12 0440      Assessment/Plan:  1 - Bladder Cancer / Cystectomy / Ureteral Revision- NED form cancer perspective. Will consider restudying ureteral anastamoses while in house with loopogram once CX final.  2 - Fever / h/o Bacteruria - f/u new CX. Will consider drain / aspirate pelvic fluid if BCX, UCX negative.  3 - Heme /  Recent DVT + PE -Remain TX-dose lovenox for now. I have asked hospitalist service to evaluate as given his bleedign hemorroids and small pelvic hematoma, it may be most prudent to DC treatment dosing.  4 - Pelvic Fluid Collection - DDx included post-op changes v. Urinoma v. dev'p abscess. Will consider IR drain if not improving clinically with more conservative measures.  5 - Hemorrhoids / Lower GI Bleed - I have asked hospitalist service for opinon, and will consider GI eval as this is his chief complaint today and with some (small) active bleed noted.   [facility], Jeannie Mallinger 06/24/2012

## 2012-06-24 NOTE — Progress Notes (Signed)
ANTIBIOTIC CONSULT NOTE - Follow Up  Pharmacy Consult for antibiotic renal dose adjustment   No Known Allergies  Patient Measurements: Height: 5\' 11"  (180.3 cm) Weight: 220 lb 11.2 oz (100.109 kg) IBW/kg (Calculated) : 75.3  Labs:  Recent Labs  06/22/12 2243 06/23/12 0530 06/24/12 0412  WBC 9.8 11.5* 13.7*  HGB 11.6* 10.5* 8.9*  PLT 349 360 378  CREATININE 1.00 0.99 1.05   Estimated Creatinine Clearance: 83.4 ml/min (by C-G formula based on Cr of 1.05).  Microbiology: 03/25/12 blood = MRSA  05/13/12 blood = MRSA  6/22 blood: NGTD 6/22 urine: 35 K Yeast  Assessment:  67 yr old male with h/o bladder cancer.  S/P surgery 1 week ago.  Currently on Vancomycin and Zosyn for bacteremia.  (see other notes.)  6/23 MD adding fluconazole 100mg  daily.  Likely ordered for new urine culture with 35k Yeast.  Per Pecola Leisure PA, ok to adjust dose to asymptomatic/symptomatic UTI recommended dose of 200mg  daily.  CrCl ~ 83 ml/min (CG using SCr 1.05)  Plan:  Increase fluconazole to 200mg  IV daily. Continue duration of 3 doses.  Lynann Beaver PharmD, BCPS Pager [phone] 06/24/2012 6:12 PM

## 2012-06-24 NOTE — Consult Note (Signed)
Ice packs prn and sitz baths BID and PRN.
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
## 2012-06-24 NOTE — Progress Notes (Addendum)
°  [facility]  Patient Status: Active (receiving services up to time of hospitalization)  [facility] is providing the following services: RN and Home Infusion Services (teaching and education will be done by nurse in the home with patient and caregiver) and PT  If patient discharges after hours, please call [phone].   Lanae Crumbly 06/24/2012, 10:43 AM

## 2012-06-25 DIAGNOSIS — R509 Fever, unspecified: Secondary | ICD-10-CM

## 2012-06-25 DIAGNOSIS — D62 Acute posthemorrhagic anemia: Secondary | ICD-10-CM | POA: Diagnosis present

## 2012-06-25 DIAGNOSIS — R7881 Bacteremia: Secondary | ICD-10-CM

## 2012-06-25 DIAGNOSIS — T889XXS Complication of surgical and medical care, unspecified, sequela: Secondary | ICD-10-CM

## 2012-06-25 DIAGNOSIS — A4902 Methicillin resistant Staphylococcus aureus infection, unspecified site: Secondary | ICD-10-CM

## 2012-06-25 LAB — CBC
HCT: 21.6 % — ABNORMAL LOW (ref 39.0–52.0)
Hemoglobin: 7 g/dL — ABNORMAL LOW (ref 13.0–17.0)
MCH: 30.3 pg (ref 26.0–34.0)
MCHC: 33.3 g/dL (ref 30.0–36.0)
MCV: 90.8 fL (ref 78.0–100.0)
RDW: 15.8 % — ABNORMAL HIGH (ref 11.5–15.5)

## 2012-06-25 LAB — BASIC METABOLIC PANEL
BUN: 8 mg/dL (ref 6–23)
CO2: 24 mEq/L (ref 19–32)
CO2: 26 mEq/L (ref 19–32)
Calcium: 8.5 mg/dL (ref 8.4–10.5)
Chloride: 105 mEq/L (ref 96–112)
Chloride: 107 mEq/L (ref 96–112)
Creatinine, Ser: 1.03 mg/dL (ref 0.50–1.35)
Glucose, Bld: 130 mg/dL — ABNORMAL HIGH (ref 70–99)
Glucose, Bld: 99 mg/dL (ref 70–99)
Potassium: 2.9 mEq/L — ABNORMAL LOW (ref 3.5–5.1)
Sodium: 140 mEq/L (ref 135–145)

## 2012-06-25 LAB — GLUCOSE, CAPILLARY
Glucose-Capillary: 120 mg/dL — ABNORMAL HIGH (ref 70–99)
Glucose-Capillary: 124 mg/dL — ABNORMAL HIGH (ref 70–99)
Glucose-Capillary: 101 mg/dL — ABNORMAL HIGH (ref 70–99)
Glucose-Capillary: 170 mg/dL — ABNORMAL HIGH (ref 70–99)

## 2012-06-25 LAB — HEMOGLOBIN AND HEMATOCRIT, BLOOD
HCT: 25.1 % — ABNORMAL LOW (ref 39.0–52.0)
Hemoglobin: 8.3 g/dL — ABNORMAL LOW (ref 13.0–17.0)

## 2012-06-25 MED ORDER — POTASSIUM CHLORIDE CRYS ER 20 MEQ PO TBCR
40.0000 meq | EXTENDED_RELEASE_TABLET | ORAL | Status: DC
  Filled 2012-06-25 (×2): qty 2

## 2012-06-25 MED ORDER — VANCOMYCIN HCL 10 G IV SOLR
1250.0000 mg | Freq: Two times a day (BID) | INTRAVENOUS | Status: DC
  Administered 2012-06-25 – 2012-06-28 (×6): 1250 mg via INTRAVENOUS
  Filled 2012-06-25 (×7): qty 1250

## 2012-06-25 MED ORDER — POTASSIUM CHLORIDE CRYS ER 20 MEQ PO TBCR
40.0000 meq | EXTENDED_RELEASE_TABLET | Freq: Once | ORAL | Status: AC
  Administered 2012-06-25: 40 meq via ORAL
  Filled 2012-06-25: qty 2

## 2012-06-25 MED ORDER — POTASSIUM CHLORIDE CRYS ER 20 MEQ PO TBCR
40.0000 meq | EXTENDED_RELEASE_TABLET | Freq: Once | ORAL | Status: DC
  Filled 2012-06-25: qty 2
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
## 2012-06-25 NOTE — Progress Notes (Signed)
[facility] PROGRESS NOTE  Chad Olson AVW:[id number] DOB: 23-Sep-1945 DOA: 06/22/2012 PCP: [name], MD   Brief narrative: 67 year old male with history of urothelial carcinoma status post robotic cystoprostatectomy in February this year with Intra- corporeal ileal conduit urinary diversion . Patient however developed left he hated it he can and right ear detail stricture and then underwent bilateral ureteral conduit anastomosis 2 weeks back and was discharged home. Patient also was admitted for MRSA bacteremia in May which was thought in the setting of chronic indwelling left nephrostomy tube and double-J stents. He had a PICC line placed with plan for treatment with IV vancomycin for 4-6 weeks. He is suppose to complete the antibiotic course today.. Patient came to   the ED on 6/21 with malaise and a fever of 101.7 at home.  Patient was also diagnosed of lower extremity DVT and bilateral PE in March of this year and began treatment with therapeutic Lovenox. We do not have any imaging in the system and this is based on provider notes from March.  He however has noticed increased hemorrhoidal bleeding since that time he has been on Lovenox for anticoagulation. A CT of the abdomen and pelvis done this admission showed large pelvic fluid collection suggestive hematoma which cannot out early abscess.  [facility] consulted for management of anticoagulation issue and duration treatment, issues with worsened hemorrhoids and unexplained fever.    Assessment/Plan: Hx of DVT and bilateral PE  - recent Doppler of his lower extremity was negative for DVT. As per provider notes from March patient had a bilateral PE around 23rd-24th of March which is 3 months ago. Patient does not have any leg swellings or shortness of breath or chest pain symptoms. The cause for his DVT and PE he is a likely underlying bladder carcinoma. Patient has been adequately treated with anticoagulation and he is now  having increased hemorrhoids and developed a pelvic hematoma.  - repeated a CT angiogram of the chest which is negative for PE. Since he has been treated for 3 months already and patient now has pelvic hematoma and bleeding hemorrhoids with drop in H&H, lovenox was stopped on 6/23.  Fever with mild leucocytosis  No clear source. He is being treated with IV vancomycin for MRSA bacteremia since may and completes treatment today. He has been continued on IV vancomycin and added empiric Zosyn on admission. blood culture so far negative. Urine cx growing yeast and started on fluconazole. He has a large pelvic hematoma which does not show any abscess but cannot rule out infection. There is also some mass effect on the rectum. - recommended for an IR consult to have the hematoma aspirated and sent for culture and drained if abscess noted.  -consulted ID (Dr Orvan Falconer) to help with abx duration further.   Anemia  significant drop in H&H this am. Ordered 2 U prbc   hemorrhoids  Patient has increased hemorrhoids after  being on anticoagulation. General surgery consulted and recommended sitz bath and ice packs for now. continue  anusol rectal creams as well.   Hypokalemia   replenish with kcl   Hypertension  Blood pressure stable. Continue metoprolol   Hyperlipidemia  Continue statin.  bladder cancer  Treatment per primary team.     HPI/Subjective: Remains afebrile, still has bleeding hemorroids  Objective: Filed Vitals:   06/24/12 1300 06/24/12 2020 06/25/12 0533 06/25/12 1300  BP: 129/79 138/84 140/67 137/75  Pulse: 90 101 95 89  Temp: 98.9 F (37.2 C) 98.7  F (37.1 C) 98.1 F (36.7 C) 98.5 F (36.9 C)  TempSrc: Oral Oral Oral Oral  Resp: 17 18 18 17   Height:      Weight:      SpO2: 99% 99% 100% 100%    Intake/Output Summary (Last 24 hours) at 06/25/12 1403 Last data filed at 06/25/12 4540  Gross per 24 hour  Intake 1833.33 ml  Output   2000 ml  Net -166.67 ml   Filed  Weights   06/23/12 0405 06/23/12 0449  Weight: 103.2 kg (227 lb 8.2 oz) 100.109 kg (220 lb 11.2 oz)    Exam:  General: Elderly male in no acute distress.  HEENT: No pallor, moist oral mucosa  Heart: Regular rate and rhythm, without murmurs, rubs, gallops.  Lungs: Clear to auscultation bilaterally.  Abdomen: , midline abdominal surgical scar with staples, Urostomy in place, Soft, nontender, nondistended, positive bowel sounds. Large friable external hemorrhoids  Extremities: No clubbing cyanosis or edema with positive pedal pulses.  Neuro: Grossly intact, nonfocal.   Data Reviewed: Basic Metabolic Panel:  Recent Labs Lab 06/22/12 2243 06/23/12 0530 06/24/12 0412 06/25/12 0440  NA 139 136 135 137  K 3.2* 3.3* 3.4* 2.9*  CL 102 100 100 105  CO2 26 26 24 24   GLUCOSE 147* 170* 138* 130*  BUN 7 7 10 8   CREATININE 1.00 0.99 1.05 1.03  CALCIUM 9.3 8.9 9.2 8.5   Liver Function Tests:  Recent Labs Lab 06/22/12 2243 06/23/12 0530  AST 15 16  ALT 11 12  ALKPHOS 65 61  BILITOT 0.3 0.3  PROT 7.2 6.7  ALBUMIN 3.1* 3.0*   No results found for this basename: LIPASE, AMYLASE,  in the last 168 hours No results found for this basename: AMMONIA,  in the last 168 hours CBC:  Recent Labs Lab 06/22/12 2243 06/23/12 0530 06/24/12 0412 06/25/12 0440  WBC 9.8 11.5* 13.7* 11.9*  NEUTROABS 6.6  --   --   --   HGB 11.6* 10.5* 8.9* 7.0*  HCT 35.5* 32.0* 27.8* 21.6*  MCV 90.3 90.1 89.4 90.8  PLT 349 360 378 233   Cardiac Enzymes: No results found for this basename: CKTOTAL, CKMB, CKMBINDEX, TROPONINI,  in the last 168 hours BNP (last 3 results) No results found for this basename: PROBNP,  in the last 8760 hours CBG:  Recent Labs Lab 06/24/12 2019 06/25/12 0003 06/25/12 0419 06/25/12 0706 06/25/12 1236  GLUCAP 142* 132* 120* 124* 101*    Recent Results (from the past 240 hour(s))  CULTURE, BLOOD (ROUTINE X 2)     Status: None   Collection Time    06/22/12 10:43 PM       Result Value Range Status   Specimen Description BLOOD LEFT ANTECUBITAL   Final   Special Requests BOTTLES DRAWN AEROBIC AND ANAEROBIC 5CC   Final   Culture  Setup Time 06/23/2012 17:47   Final   Culture     Final   Value:        BLOOD CULTURE RECEIVED NO GROWTH TO DATE CULTURE WILL BE HELD FOR 5 DAYS BEFORE ISSUING A FINAL NEGATIVE REPORT   Report Status PENDING   Incomplete  CULTURE, BLOOD (ROUTINE X 2)     Status: None   Collection Time    06/22/12 10:57 PM      Result Value Range Status   Specimen Description BLOOD LEFT ARM   Final   Special Requests BOTTLES DRAWN AEROBIC AND ANAEROBIC 10CC   Final  Culture  Setup Time 06/23/2012 17:47   Final   Culture     Final   Value:        BLOOD CULTURE RECEIVED NO GROWTH TO DATE CULTURE WILL BE HELD FOR 5 DAYS BEFORE ISSUING A FINAL NEGATIVE REPORT   Report Status PENDING   Incomplete  URINE CULTURE     Status: None   Collection Time    06/23/12  2:17 AM      Result Value Range Status   Specimen Description URINE, CATHETERIZED   Final   Special Requests Normal   Final   Culture  Setup Time 06/23/2012 15:21   Final   Colony Count 35,000 COLONIES/ML   Final   Culture YEAST   Final   Report Status 06/24/2012 FINAL   Final  MRSA PCR SCREENING     Status: None   Collection Time    06/24/12  9:58 AM      Result Value Range Status   MRSA by PCR NEGATIVE  NEGATIVE Final   Comment:            The GeneXpert MRSA Assay (FDA     approved for NASAL specimens     only), is one component of a     comprehensive MRSA colonization     surveillance program. It is not     intended to diagnose MRSA     infection nor to guide or     monitor treatment for     MRSA infections.     Studies: Ct Angio Chest Pe W/cm &/or Wo Cm  06/24/2012   *RADIOLOGY REPORT*  Clinical Data: History of pulmonary emboli 03/2012, fever, pelvic pain/hematoma, lower GI bleed  CT ANGIOGRAPHY CHEST  Technique:  Multidetector CT imaging of the chest using the standard  protocol during bolus administration of intravenous contrast. Multiplanar reconstructed images including MIPs were obtained and reviewed to evaluate the vascular anatomy.,  Contrast: OMNIPAQUE IOHEXOL 350 MG/ML SOLN  Comparison: None.  Findings: Right upper extremity PICC line tip extends into the SVC. Atherosclerotic changes of the thoracic aorta.  Negative for aneurysm or dissection.  Major branch vessels are patent. Visualized central pulmonary arteries are patent.  No significant proximal or hilar pulmonary embolus by CTA.  Normal heart size.  No pericardial or pleural effusion.  No hiatal hernia.  No abnormal adenopathy within the chest.  Lung windows demonstrate no focal airspace process, acute pneumonia, collapse, consolidation, edema, or interstitial process. Minor dependent basilar atelectasis.  IMPRESSION: Negative for significant acute pulmonary embolus by CTA.  Thoracic atherosclerosis without aneurysm or dissection  Minimal basilar atelectasis  No acute intrathoracic process  CT ANGIOGRAPHY ABDOMEN AND PELVIS  Technique:  Multidetector CT imaging of the abdomen and pelvis was performed using the standard protocol during bolus administration of intravenous contrast.  Multiplanar reconstructed images including MIPs were obtained and reviewed to evaluate the vascular anatomy.  Comparison:  06/23/2012, 06/02/2012  Findings:  Minor atherosclerosis of the aorta and iliac bifurcation.  Negative for aneurysm, dissection, or retroperitoneal hemorrhage.  Celiac, SMA, renal arteries, and IMA all remain patent.  Negative for renal or mesenteric vascular occlusive process.  Common, internal, and external iliac arteries are patent and the pelvis.  The visualized common femoral, proximal superficial femoral and profunda femoral arteries visualized are also patent.  Large heterogeneous pelvic fluid collection again evident. Measuring at a similar level, this pelvic fluid collection roughly measures 15 x 8 cm.   Slight interval enlargement with more mass  effect on the rectum.  No evidence of active contrast extravasation or bleeding demonstrated within the hematoma.  Layering small gallstones noted, image 92.  No biliary dilatation. No focal hepatic abnormality.  Biliary system, pancreas, spleen, and adrenal glands are within normal limits and stable.  Stable right renal cysts.  Bilateral ureteral stents noted extending through the ileal conduit.  External left nephrostomy catheter remains.  No evidence of hydronephrosis or obstruction. Postop changes from an ileal loop conduit.  Negative for bowel obstruction, dilatation, or ileus.  Normal appendix.  Postop changes from midline laparotomy below the umbilicus.  Stable postoperative appearance of the lower abdomen and upper pelvis.  Hemorrhoids evident versus rectal prolapse.  Recommend correlation with exam.  Scrotal swelling noted with bilateral hydroceles.  Degenerative changes diffusely of the spine.   Review of the MIP images confirms the above findings.  IMPRESSION: No evidence of acute contrast extravasation or active bleeding.  Slight interval enlargement of the lower abdominal and pelvic hematoma with more mass effect on the rectum.   Original Report Authenticated By: Judie Petit. Miles Costain, M.D.   Ct Angio Abd/pel W/ And/or W/o  06/24/2012   *RADIOLOGY REPORT*  Clinical Data: History of pulmonary emboli 03/2012, fever, pelvic pain/hematoma, lower GI bleed  CT ANGIOGRAPHY CHEST  Technique:  Multidetector CT imaging of the chest using the standard protocol during bolus administration of intravenous contrast. Multiplanar reconstructed images including MIPs were obtained and reviewed to evaluate the vascular anatomy.,  Contrast: OMNIPAQUE IOHEXOL 350 MG/ML SOLN  Comparison: None.  Findings: Right upper extremity PICC line tip extends into the SVC. Atherosclerotic changes of the thoracic aorta.  Negative for aneurysm or dissection.  Major branch vessels are patent.  Visualized central pulmonary arteries are patent.  No significant proximal or hilar pulmonary embolus by CTA.  Normal heart size.  No pericardial or pleural effusion.  No hiatal hernia.  No abnormal adenopathy within the chest.  Lung windows demonstrate no focal airspace process, acute pneumonia, collapse, consolidation, edema, or interstitial process. Minor dependent basilar atelectasis.  IMPRESSION: Negative for significant acute pulmonary embolus by CTA.  Thoracic atherosclerosis without aneurysm or dissection  Minimal basilar atelectasis  No acute intrathoracic process  CT ANGIOGRAPHY ABDOMEN AND PELVIS  Technique:  Multidetector CT imaging of the abdomen and pelvis was performed using the standard protocol during bolus administration of intravenous contrast.  Multiplanar reconstructed images including MIPs were obtained and reviewed to evaluate the vascular anatomy.  Comparison:  06/23/2012, 06/02/2012  Findings:  Minor atherosclerosis of the aorta and iliac bifurcation.  Negative for aneurysm, dissection, or retroperitoneal hemorrhage.  Celiac, SMA, renal arteries, and IMA all remain patent.  Negative for renal or mesenteric vascular occlusive process.  Common, internal, and external iliac arteries are patent and the pelvis.  The visualized common femoral, proximal superficial femoral and profunda femoral arteries visualized are also patent.  Large heterogeneous pelvic fluid collection again evident. Measuring at a similar level, this pelvic fluid collection roughly measures 15 x 8 cm.  Slight interval enlargement with more mass effect on the rectum.  No evidence of active contrast extravasation or bleeding demonstrated within the hematoma.  Layering small gallstones noted, image 92.  No biliary dilatation. No focal hepatic abnormality.  Biliary system, pancreas, spleen, and adrenal glands are within normal limits and stable.  Stable right renal cysts.  Bilateral ureteral stents noted extending through the  ileal conduit.  External left nephrostomy catheter remains.  No evidence of hydronephrosis or obstruction. Postop changes from an  ileal loop conduit.  Negative for bowel obstruction, dilatation, or ileus.  Normal appendix.  Postop changes from midline laparotomy below the umbilicus.  Stable postoperative appearance of the lower abdomen and upper pelvis.  Hemorrhoids evident versus rectal prolapse.  Recommend correlation with exam.  Scrotal swelling noted with bilateral hydroceles.  Degenerative changes diffusely of the spine.   Review of the MIP images confirms the above findings.  IMPRESSION: No evidence of acute contrast extravasation or active bleeding.  Slight interval enlargement of the lower abdominal and pelvic hematoma with more mass effect on the rectum.   Original Report Authenticated By: Judie Petit. Shick, M.D.    Scheduled Meds: . acidophilus  1 capsule Oral Daily  . bisacodyl  10 mg Rectal BID  . feeding supplement  237 mL Oral BID BM  . feeding supplement  1 Container Oral BID BM  . fluconazole (DIFLUCAN) IV  200 mg Intravenous Q24H  . hydrocortisone   Rectal TID  . insulin aspart  0-15 Units Subcutaneous Q4H  . metoprolol tartrate  12.5 mg Oral BID  . multivitamin with minerals  1 tablet Oral Daily  . pantoprazole  40 mg Oral Daily  . piperacillin-tazobactam (ZOSYN)  IV  3.375 g Intravenous Q8H  . potassium chloride  40 mEq Oral Once  . senna-docusate  1 tablet Oral BID  . simvastatin  10 mg Oral q1800  . vancomycin  1,250 mg Intravenous Q12H   Continuous Infusions: . sodium chloride 75 mL/hr at 06/25/12 0742       Time spent: 35 minutes    Parley Pidcock  [facility] Pager [phone]. If 7PM-7AM, please contact night-coverage at [email or web address], password [email or web address] 06/25/2012, 2:03 PM  LOS: 3 days

## 2012-06-25 NOTE — Progress Notes (Signed)
Dr. Berneice Heinrich notified of labs (H & H, Bmet). No additional orders at this time. Ginny Forth

## 2012-06-25 NOTE — Progress Notes (Signed)
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 Healed well initially and sent home 6/17.  2 - Fever / h/o Bacteruria - Pt with MRSA in urine by hospital UCX 03/2012 and again subsequently in blood. Now on Vancomycin daily per ID x 4-6 week total (nearing end of course). Most recent UCX, BCX negative, New set pending from 6/22. Pt readmitted with low-grade fever 6/22 to 100.7 and malaise, no sig leukocytosis. Now on Vanc + Zosyn emirically. UCX with yeast (now getting diflucan x 3 days), BCX now growth to date.  3 - Heme / Recent DVT + PE -incidental PE by CT 03/2012 now on Lovenox, DVT resolved by most recent LE duplex. Repeat CT-PE 6/23 without PE or active bleed from pelvis and Lovenox DC'd.  4 - Pelvic Fluid Collection - Pelvic fluid collection without rim-enhancement noted on CT from ER 6/22. Appears partially simple fluid with some dependent blood by HU analysis, no active extrav of blood or urine by imaging.  5 - Hemorrhoids / Lower GI Bleed - Long h/o hemorroids. Pt with worsened / friable bleeding hemorroids on admission that are quite bothersome. No prior specific intervention, likely exacerbated by pelvic fluid collection / hematoma. Gen Surg recs conservative measures.  PMH sig for obesity and left knee replacement, PE + DVT (now resolved). No CV disease.   Today Rafel is still c/o hemorrhoids as most botherseom. No fevers. Had BM x2. Hgb down today to 7 and K low as well.  Objective: Vital signs in last 24 hours: Temp:  [98.1 F (36.7 C)-98.9 F (37.2 C)] 98.1 F (36.7 C) (06/24 0533) Pulse Rate:  [90-101] 95 (06/24  0533) Resp:  [17-18] 18 (06/24 0533) BP: (129-140)/(67-84) 140/67 mmHg (06/24 0533) SpO2:  [99 %-100 %] 100 % (06/24 0533) Last BM Date: 06/23/12 (Pt. having frequent stools)  Intake/Output from previous day: 06/23 0701 - 06/24 0700 In: 2073.3 [P.O.:240; I.V.:1533.3; IV Piggyback:300] Out: 2800 [Urine:2800] Intake/Output this shift:    General appearance: alert, cooperative, appears stated age and family at bedside Head: Normocephalic, without obvious abnormality, atraumatic Eyes: conjunctivae/corneas clear. PERRL, EOM's intact. Fundi benign. Ears: normal TM's and external ear canals both ears Nose: Nares normal. Septum midline. Mucosa normal. No drainage or sinus tenderness. Throat: lips, mucosa, and tongue normal; teeth and gums normal Neck: no adenopathy, no carotid bruit, no JVD, supple, symmetrical, trachea midline and thyroid not enlarged, symmetric, no tenderness/mass/nodules Back: symmetric, no curvature. ROM normal. No CVA tenderness. Resp: clear to auscultation bilaterally Chest wall: no tenderness Cardio: regular rate and rhythm, S1, S2 normal, no murmur, click, rub or gallop GI: soft, non-tender; bowel sounds normal; no masses,  no organomegaly Male genitalia: normal, bruising of perineum c/w known pelvic collection. Extremities: extremities normal, atraumatic, no cyanosis or edema and RUE PICC c/d/i. NO erythema. Pulses: 2+ and symmetric Skin: Skin color, texture, turgor normal. No rashes or lesions Lymph nodes: Cervical, supraclavicular, and axillary nodes normal. Neurologic: Grossly normal Incision/Wound: Recent midline incision c/d/i. hemorrods wtih mild blood present on sheets. RLQ urostomy pink / patent with bander stnets in postiion.  Lab Results:   Recent Labs  06/24/12 0412 06/25/12 0440  WBC 13.7* 11.9*  HGB 8.9* 7.0*  HCT 27.8* 21.6*  PLT 378 233  BMET  Recent Labs  06/24/12 0412 06/25/12 0440  NA 135 137  K 3.4* 2.9*  CL 100 105  CO2 24  24  GLUCOSE 138* 130*  BUN 10 8  CREATININE 1.05 1.03  CALCIUM 9.2 8.5   PT/INR No results found for this basename: LABPROT, INR,  in the last 72 hours ABG No results found for this basename: PHART, PCO2, PO2, HCO3,  in the last 72 hours  Studies/Results: X-ray Chest Pa And Lateral   06/23/2012   *RADIOLOGY REPORT*  Clinical Data: Evaluate pneumonia  CHEST - 2 VIEW  Comparison: 06/02/2012  Findings: There is a right arm PICC line with tip in the cavoatrial junction.  Normal heart size.  No pleural effusion or edema.  No airspace consolidation identified.  IMPRESSION:  1.  No significant change from previous exam. 2.  The right arm PICC line tip is in the cavoatrial junction.   Original Report Authenticated By: Signa Kell, M.D.   Ct Angio Chest Pe W/cm &/or Wo Cm  06/24/2012   *RADIOLOGY REPORT*  Clinical Data: History of pulmonary emboli 03/2012, fever, pelvic pain/hematoma, lower GI bleed  CT ANGIOGRAPHY CHEST  Technique:  Multidetector CT imaging of the chest using the standard protocol during bolus administration of intravenous contrast. Multiplanar reconstructed images including MIPs were obtained and reviewed to evaluate the vascular anatomy.,  Contrast: OMNIPAQUE IOHEXOL 350 MG/ML SOLN  Comparison: None.  Findings: Right upper extremity PICC line tip extends into the SVC. Atherosclerotic changes of the thoracic aorta.  Negative for aneurysm or dissection.  Major branch vessels are patent. Visualized central pulmonary arteries are patent.  No significant proximal or hilar pulmonary embolus by CTA.  Normal heart size.  No pericardial or pleural effusion.  No hiatal hernia.  No abnormal adenopathy within the chest.  Lung windows demonstrate no focal airspace process, acute pneumonia, collapse, consolidation, edema, or interstitial process. Minor dependent basilar atelectasis.  IMPRESSION: Negative for significant acute pulmonary embolus by CTA.  Thoracic atherosclerosis without aneurysm  or dissection  Minimal basilar atelectasis  No acute intrathoracic process  CT ANGIOGRAPHY ABDOMEN AND PELVIS  Technique:  Multidetector CT imaging of the abdomen and pelvis was performed using the standard protocol during bolus administration of intravenous contrast.  Multiplanar reconstructed images including MIPs were obtained and reviewed to evaluate the vascular anatomy.  Comparison:  06/23/2012, 06/02/2012  Findings:  Minor atherosclerosis of the aorta and iliac bifurcation.  Negative for aneurysm, dissection, or retroperitoneal hemorrhage.  Celiac, SMA, renal arteries, and IMA all remain patent.  Negative for renal or mesenteric vascular occlusive process.  Common, internal, and external iliac arteries are patent and the pelvis.  The visualized common femoral, proximal superficial femoral and profunda femoral arteries visualized are also patent.  Large heterogeneous pelvic fluid collection again evident. Measuring at a similar level, this pelvic fluid collection roughly measures 15 x 8 cm.  Slight interval enlargement with more mass effect on the rectum.  No evidence of active contrast extravasation or bleeding demonstrated within the hematoma.  Layering small gallstones noted, image 92.  No biliary dilatation. No focal hepatic abnormality.  Biliary system, pancreas, spleen, and adrenal glands are within normal limits and stable.  Stable right renal cysts.  Bilateral ureteral stents noted extending through the ileal conduit.  External left nephrostomy catheter remains.  No evidence of hydronephrosis or obstruction. Postop changes from an ileal loop conduit.  Negative for bowel obstruction, dilatation, or ileus.  Normal appendix.  Postop changes from midline  laparotomy below the umbilicus.  Stable postoperative appearance of the lower abdomen and upper pelvis.  Hemorrhoids evident versus rectal prolapse.  Recommend correlation with exam.  Scrotal swelling noted with bilateral hydroceles.  Degenerative changes  diffusely of the spine.   Review of the MIP images confirms the above findings.  IMPRESSION: No evidence of acute contrast extravasation or active bleeding.  Slight interval enlargement of the lower abdominal and pelvic hematoma with more mass effect on the rectum.   Original Report Authenticated By: Judie Petit. Miles Costain, M.D.   Ct Angio Abd/pel W/ And/or W/o  06/24/2012   *RADIOLOGY REPORT*  Clinical Data: History of pulmonary emboli 03/2012, fever, pelvic pain/hematoma, lower GI bleed  CT ANGIOGRAPHY CHEST  Technique:  Multidetector CT imaging of the chest using the standard protocol during bolus administration of intravenous contrast. Multiplanar reconstructed images including MIPs were obtained and reviewed to evaluate the vascular anatomy.,  Contrast: OMNIPAQUE IOHEXOL 350 MG/ML SOLN  Comparison: None.  Findings: Right upper extremity PICC line tip extends into the SVC. Atherosclerotic changes of the thoracic aorta.  Negative for aneurysm or dissection.  Major branch vessels are patent. Visualized central pulmonary arteries are patent.  No significant proximal or hilar pulmonary embolus by CTA.  Normal heart size.  No pericardial or pleural effusion.  No hiatal hernia.  No abnormal adenopathy within the chest.  Lung windows demonstrate no focal airspace process, acute pneumonia, collapse, consolidation, edema, or interstitial process. Minor dependent basilar atelectasis.  IMPRESSION: Negative for significant acute pulmonary embolus by CTA.  Thoracic atherosclerosis without aneurysm or dissection  Minimal basilar atelectasis  No acute intrathoracic process  CT ANGIOGRAPHY ABDOMEN AND PELVIS  Technique:  Multidetector CT imaging of the abdomen and pelvis was performed using the standard protocol during bolus administration of intravenous contrast.  Multiplanar reconstructed images including MIPs were obtained and reviewed to evaluate the vascular anatomy.  Comparison:  06/23/2012, 06/02/2012  Findings:  Minor  atherosclerosis of the aorta and iliac bifurcation.  Negative for aneurysm, dissection, or retroperitoneal hemorrhage.  Celiac, SMA, renal arteries, and IMA all remain patent.  Negative for renal or mesenteric vascular occlusive process.  Common, internal, and external iliac arteries are patent and the pelvis.  The visualized common femoral, proximal superficial femoral and profunda femoral arteries visualized are also patent.  Large heterogeneous pelvic fluid collection again evident. Measuring at a similar level, this pelvic fluid collection roughly measures 15 x 8 cm.  Slight interval enlargement with more mass effect on the rectum.  No evidence of active contrast extravasation or bleeding demonstrated within the hematoma.  Layering small gallstones noted, image 92.  No biliary dilatation. No focal hepatic abnormality.  Biliary system, pancreas, spleen, and adrenal glands are within normal limits and stable.  Stable right renal cysts.  Bilateral ureteral stents noted extending through the ileal conduit.  External left nephrostomy catheter remains.  No evidence of hydronephrosis or obstruction. Postop changes from an ileal loop conduit.  Negative for bowel obstruction, dilatation, or ileus.  Normal appendix.  Postop changes from midline laparotomy below the umbilicus.  Stable postoperative appearance of the lower abdomen and upper pelvis.  Hemorrhoids evident versus rectal prolapse.  Recommend correlation with exam.  Scrotal swelling noted with bilateral hydroceles.  Degenerative changes diffusely of the spine.   Review of the MIP images confirms the above findings.  IMPRESSION: No evidence of acute contrast extravasation or active bleeding.  Slight interval enlargement of the lower abdominal and pelvic hematoma with more mass effect on  the rectum.   Original Report Authenticated By: Judie Petit. Miles Costain, M.D.    Anti-infectives: Anti-infectives   Start     Dose/Rate Route Frequency Ordered Stop   06/24/12 2000   fluconazole (DIFLUCAN) IVPB 200 mg     200 mg 100 mL/hr over 60 Minutes Intravenous Every 24 hours 06/24/12 1733 06/27/12 1959   06/23/12 0900  vancomycin (VANCOCIN) 1,250 mg in sodium chloride 0.9 % 250 mL IVPB     1,250 mg 166.7 mL/hr over 90 Minutes Intravenous Every 12 hours 06/23/12 0522 06/24/12 2231   06/23/12 0600  piperacillin-tazobactam (ZOSYN) IVPB 3.375 g     3.375 g 12.5 mL/hr over 240 Minutes Intravenous 3 times per day 06/23/12 0447     06/23/12 0330  cefTRIAXone (ROCEPHIN) 1 g in dextrose 5 % 50 mL IVPB     1 g 100 mL/hr over 30 Minutes Intravenous  Once 06/23/12 0319 06/23/12 0440      Assessment/Plan:  1 - Bladder Cancer / Cystectomy / Ureteral Revision- NED form cancer perspective. Will consider restudying ureteral anastamoses while in house with loopogram once CX final and Hgb stable.  2 - Fever / h/o Bacteruria - f/u new CX. Will consider drain / aspirate pelvic fluid if BCX, UCX negative once hgb stable.  3 - Heme / Recent DVT + PE -AGree with stop Lovenox. Transfuse 2uPRBC today adn recheck post. Want to verify hgb stability before any pelvic drain placement.  4 - Pelvic Fluid Collection - DDx included post-op changes v. Urinoma v. dev'p abscess. Will consider IR drain when hgb stable.  5 - Hemorrhoids / Lower GI Bleed - AGree with conservative measures for now. Transfuse as per above. Again, will consider pelvic drain to decrease pressure on rectal venous return on ce hgb stable.  Greatly appreciate hospitalist and gen surgery input.  [facility], Karolee Meloni 06/25/2012

## 2012-06-25 NOTE — Progress Notes (Signed)
Seen, agree with above.  Hemorrhoidal skin much less taut today.    Does not appear as purple either.  Continue symptomatic care with sitz baths and ice packs.

## 2012-06-25 NOTE — Progress Notes (Signed)
Patient ID: Chad Olson, male   DOB: Oct 13, 1945, 67 y.o.   MRN: [id number]    Subjective: Sore scrotum and rectum, no other complaints, going to get blood today  Objective: Vital signs in last 24 hours: Temp:  [98.1 F (36.7 C)-98.9 F (37.2 C)] 98.1 F (36.7 C) (06/24 0533) Pulse Rate:  [90-101] 95 (06/24 0533) Resp:  [17-18] 18 (06/24 0533) BP: (129-140)/(67-84) 140/67 mmHg (06/24 0533) SpO2:  [99 %-100 %] 100 % (06/24 0533) Last BM Date: 06/23/12 (Pt. having frequent stools)  Intake/Output from previous day: 06/23 0701 - 06/24 0700 In: 2073.3 [P.O.:240; I.V.:1533.3; IV Piggyback:300] Out: 2800 [Urine:2800] Intake/Output this shift:    PE: Abd: mildly tender in pelvis, no peritonitis Scrotum with severe ecchymosis, buttocks and rectal area same, some tense hematoma  Lab Results:   Recent Labs  06/24/12 0412 06/25/12 0440  WBC 13.7* 11.9*  HGB 8.9* 7.0*  HCT 27.8* 21.6*  PLT 378 233   BMET  Recent Labs  06/24/12 0412 06/25/12 0440  NA 135 137  K 3.4* 2.9*  CL 100 105  CO2 24 24  GLUCOSE 138* 130*  BUN 10 8  CREATININE 1.05 1.03  CALCIUM 9.2 8.5   PT/INR No results found for this basename: LABPROT, INR,  in the last 72 hours CMP     Component Value Date/Time   NA 137 06/25/2012 0440   NA 139 04/18/2012 0801   K 2.9* 06/25/2012 0440   K 3.9 04/18/2012 0801   CL 105 06/25/2012 0440   CL 105 04/18/2012 0801   CO2 24 06/25/2012 0440   CO2 22 04/18/2012 0801   GLUCOSE 130* 06/25/2012 0440   GLUCOSE 121* 04/18/2012 0801   BUN 8 06/25/2012 0440   BUN 9.1 04/18/2012 0801   CREATININE 1.03 06/25/2012 0440   CREATININE 1.1 04/18/2012 0801   CALCIUM 8.5 06/25/2012 0440   CALCIUM 9.3 04/18/2012 0801   PROT 6.7 06/23/2012 0530   PROT 7.3 04/18/2012 0801   ALBUMIN 3.0* 06/23/2012 0530   ALBUMIN 2.7* 04/18/2012 0801   AST 16 06/23/2012 0530   AST 16 04/18/2012 0801   ALT 12 06/23/2012 0530   ALT 22 04/18/2012 0801   ALKPHOS 61 06/23/2012 0530   ALKPHOS 82 04/18/2012 0801    BILITOT 0.3 06/23/2012 0530   BILITOT 0.46 04/18/2012 0801   GFRNONAA 74* 06/25/2012 0440   GFRAA 85* 06/25/2012 0440   Lipase     Component Value Date/Time   LIPASE 25 05/13/2012 1650       Studies/Results: Ct Angio Chest Pe W/cm &/or Wo Cm  06/24/2012   *RADIOLOGY REPORT*  Clinical Data: History of pulmonary emboli 03/2012, fever, pelvic pain/hematoma, lower GI bleed  CT ANGIOGRAPHY CHEST  Technique:  Multidetector CT imaging of the chest using the standard protocol during bolus administration of intravenous contrast. Multiplanar reconstructed images including MIPs were obtained and reviewed to evaluate the vascular anatomy.,  Contrast: OMNIPAQUE IOHEXOL 350 MG/ML SOLN  Comparison: None.  Findings: Right upper extremity PICC line tip extends into the SVC. Atherosclerotic changes of the thoracic aorta.  Negative for aneurysm or dissection.  Major branch vessels are patent. Visualized central pulmonary arteries are patent.  No significant proximal or hilar pulmonary embolus by CTA.  Normal heart size.  No pericardial or pleural effusion.  No hiatal hernia.  No abnormal adenopathy within the chest.  Lung windows demonstrate no focal airspace process, acute pneumonia, collapse, consolidation, edema, or interstitial process. Minor dependent basilar atelectasis.  IMPRESSION: Negative for significant acute pulmonary embolus by CTA.  Thoracic atherosclerosis without aneurysm or dissection  Minimal basilar atelectasis  No acute intrathoracic process  CT ANGIOGRAPHY ABDOMEN AND PELVIS  Technique:  Multidetector CT imaging of the abdomen and pelvis was performed using the standard protocol during bolus administration of intravenous contrast.  Multiplanar reconstructed images including MIPs were obtained and reviewed to evaluate the vascular anatomy.  Comparison:  06/23/2012, 06/02/2012  Findings:  Minor atherosclerosis of the aorta and iliac bifurcation.  Negative for aneurysm, dissection, or  retroperitoneal hemorrhage.  Celiac, SMA, renal arteries, and IMA all remain patent.  Negative for renal or mesenteric vascular occlusive process.  Common, internal, and external iliac arteries are patent and the pelvis.  The visualized common femoral, proximal superficial femoral and profunda femoral arteries visualized are also patent.  Large heterogeneous pelvic fluid collection again evident. Measuring at a similar level, this pelvic fluid collection roughly measures 15 x 8 cm.  Slight interval enlargement with more mass effect on the rectum.  No evidence of active contrast extravasation or bleeding demonstrated within the hematoma.  Layering small gallstones noted, image 92.  No biliary dilatation. No focal hepatic abnormality.  Biliary system, pancreas, spleen, and adrenal glands are within normal limits and stable.  Stable right renal cysts.  Bilateral ureteral stents noted extending through the ileal conduit.  External left nephrostomy catheter remains.  No evidence of hydronephrosis or obstruction. Postop changes from an ileal loop conduit.  Negative for bowel obstruction, dilatation, or ileus.  Normal appendix.  Postop changes from midline laparotomy below the umbilicus.  Stable postoperative appearance of the lower abdomen and upper pelvis.  Hemorrhoids evident versus rectal prolapse.  Recommend correlation with exam.  Scrotal swelling noted with bilateral hydroceles.  Degenerative changes diffusely of the spine.   Review of the MIP images confirms the above findings.  IMPRESSION: No evidence of acute contrast extravasation or active bleeding.  Slight interval enlargement of the lower abdominal and pelvic hematoma with more mass effect on the rectum.   Original Report Authenticated By: Judie Petit. Miles Costain, M.D.   Ct Angio Abd/pel W/ And/or W/o  06/24/2012   *RADIOLOGY REPORT*  Clinical Data: History of pulmonary emboli 03/2012, fever, pelvic pain/hematoma, lower GI bleed  CT ANGIOGRAPHY CHEST  Technique:   Multidetector CT imaging of the chest using the standard protocol during bolus administration of intravenous contrast. Multiplanar reconstructed images including MIPs were obtained and reviewed to evaluate the vascular anatomy.,  Contrast: OMNIPAQUE IOHEXOL 350 MG/ML SOLN  Comparison: None.  Findings: Right upper extremity PICC line tip extends into the SVC. Atherosclerotic changes of the thoracic aorta.  Negative for aneurysm or dissection.  Major branch vessels are patent. Visualized central pulmonary arteries are patent.  No significant proximal or hilar pulmonary embolus by CTA.  Normal heart size.  No pericardial or pleural effusion.  No hiatal hernia.  No abnormal adenopathy within the chest.  Lung windows demonstrate no focal airspace process, acute pneumonia, collapse, consolidation, edema, or interstitial process. Minor dependent basilar atelectasis.  IMPRESSION: Negative for significant acute pulmonary embolus by CTA.  Thoracic atherosclerosis without aneurysm or dissection  Minimal basilar atelectasis  No acute intrathoracic process  CT ANGIOGRAPHY ABDOMEN AND PELVIS  Technique:  Multidetector CT imaging of the abdomen and pelvis was performed using the standard protocol during bolus administration of intravenous contrast.  Multiplanar reconstructed images including MIPs were obtained and reviewed to evaluate the vascular anatomy.  Comparison:  06/23/2012, 06/02/2012  Findings:  Minor  atherosclerosis of the aorta and iliac bifurcation.  Negative for aneurysm, dissection, or retroperitoneal hemorrhage.  Celiac, SMA, renal arteries, and IMA all remain patent.  Negative for renal or mesenteric vascular occlusive process.  Common, internal, and external iliac arteries are patent and the pelvis.  The visualized common femoral, proximal superficial femoral and profunda femoral arteries visualized are also patent.  Large heterogeneous pelvic fluid collection again evident. Measuring at a similar level,  this pelvic fluid collection roughly measures 15 x 8 cm.  Slight interval enlargement with more mass effect on the rectum.  No evidence of active contrast extravasation or bleeding demonstrated within the hematoma.  Layering small gallstones noted, image 92.  No biliary dilatation. No focal hepatic abnormality.  Biliary system, pancreas, spleen, and adrenal glands are within normal limits and stable.  Stable right renal cysts.  Bilateral ureteral stents noted extending through the ileal conduit.  External left nephrostomy catheter remains.  No evidence of hydronephrosis or obstruction. Postop changes from an ileal loop conduit.  Negative for bowel obstruction, dilatation, or ileus.  Normal appendix.  Postop changes from midline laparotomy below the umbilicus.  Stable postoperative appearance of the lower abdomen and upper pelvis.  Hemorrhoids evident versus rectal prolapse.  Recommend correlation with exam.  Scrotal swelling noted with bilateral hydroceles.  Degenerative changes diffusely of the spine.   Review of the MIP images confirms the above findings.  IMPRESSION: No evidence of acute contrast extravasation or active bleeding.  Slight interval enlargement of the lower abdominal and pelvic hematoma with more mass effect on the rectum.   Original Report Authenticated By: Judie Petit. Miles Costain, M.D.    Anti-infectives: Anti-infectives   Start     Dose/Rate Route Frequency Ordered Stop   06/25/12 1000  vancomycin (VANCOCIN) 1,250 mg in sodium chloride 0.9 % 250 mL IVPB     1,250 mg 166.7 mL/hr over 90 Minutes Intravenous Every 12 hours 06/25/12 0856     06/24/12 2000  fluconazole (DIFLUCAN) IVPB 200 mg     200 mg 100 mL/hr over 60 Minutes Intravenous Every 24 hours 06/24/12 1733 06/27/12 1959   06/23/12 0900  vancomycin (VANCOCIN) 1,250 mg in sodium chloride 0.9 % 250 mL IVPB     1,250 mg 166.7 mL/hr over 90 Minutes Intravenous Every 12 hours 06/23/12 0522 06/24/12 2231   06/23/12 0600  piperacillin-tazobactam  (ZOSYN) IVPB 3.375 g     3.375 g 12.5 mL/hr over 240 Minutes Intravenous 3 times per day 06/23/12 0447     06/23/12 0330  cefTRIAXone (ROCEPHIN) 1 g in dextrose 5 % 50 mL IVPB     1 g 100 mL/hr over 30 Minutes Intravenous  Once 06/23/12 0319 06/23/12 0440       Assessment/Plan 1. Likely massive pelvic hematoma, ? Continued ooze  2. Hemorrhoids secondary to pelvic pressure from #1  3. PEs was on Lovenox, now off  4. S/p cystoprostatectomy and creation of urostomy and revision  Plan: Ice packs and sitz bathes PRN Monitor Hgb We will see later this week   LOS: 3 days    Nikoleta Dady 06/25/2012

## 2012-06-25 NOTE — Progress Notes (Signed)
ANTIBIOTIC CONSULT NOTE - Follow Up  Pharmacy Consult for Vancomycin Indication: MRSA Bacteremia  No Known Allergies  Patient Measurements: Height: 5\' 11"  (180.3 cm) Weight: 220 lb 11.2 oz (100.109 kg) IBW/kg (Calculated) : 75.3  Vital Signs: Temp: 98.1 F (36.7 C) (06/24 0533) Temp src: Oral (06/24 0533) BP: 140/67 mmHg (06/24 0533) Pulse Rate: 95 (06/24 0533)  Labs:  Recent Labs  06/23/12 0530 06/24/12 0412 06/25/12 0440  WBC 11.5* 13.7* 11.9*  HGB 10.5* 8.9* 7.0*  PLT 360 378 233  CREATININE 0.99 1.05 1.03   Estimated Creatinine Clearance: 85 ml/min (by C-G formula based on Cr of 1.03).  Recent Labs  06/25/12 0800  VANCOTROUGH 14.8     Microbiology: 03/25/12 blood = MRSA 05/13/12 blood = MRSA 6/22 blood: NGTD 6/22 urine: 35K yeast 6/23 mrsa pcr: (-)  Assessment:  67 yr old male with h/o bladder cancer.  S/P surgery 1 week ago.  With complaint of fever, abdominal pain, nausea/vomiting. In ED received Rocephin 1gm IV X 1.  On Vancomycin 1500mg  IV q24h prior to admission for MRSA bacteremia (confirmed dose with [facility])  IV vancomycin to continue for MRSA bacteremia and suspected post-op infection.  IV Zosyn per MD dosing also ordered.  Vancomycin trough level therapeutic with last admission on 6/16 with a regimen of Vancomycin 1250mg  IV q12h  Per 06/05/12 note from Dr. Daiva Eves - plan at that time was for patient to continue Vanco thru 06/25/12 to complete a total of 6 wks of IV Vanco  Vanco trough this am was therapeutic on a dose of 1250mg  IV q12h, so will continue this dose for now.  Goal of Therapy:  Vancomycin trough level 15-20 mcg/ml  Plan:  1) Continue Vanco 1250mg  IV q12h 2) F/U with MD about planned duration of Vanco (is plan still to end Vanco after 6/24 doses, or will therapy be extended?)  Darrol Angel, PharmD Pager: [phone] 06/25/2012,10:54 AM

## 2012-06-26 ENCOUNTER — Encounter (HOSPITAL_COMMUNITY): Payer: Self-pay | Admitting: Radiology

## 2012-06-26 DIAGNOSIS — R5082 Postprocedural fever: Secondary | ICD-10-CM

## 2012-06-26 DIAGNOSIS — N501 Vascular disorders of male genital organs: Secondary | ICD-10-CM

## 2012-06-26 HISTORY — DX: Vascular disorders of male genital organs: N50.1

## 2012-06-26 LAB — CBC
Hemoglobin: 7.9 g/dL — ABNORMAL LOW (ref 13.0–17.0)
MCH: 29 pg (ref 26.0–34.0)
MCHC: 32.2 g/dL (ref 30.0–36.0)
MCV: 90.1 fL (ref 78.0–100.0)
RBC: 2.72 MIL/uL — ABNORMAL LOW (ref 4.22–5.81)

## 2012-06-26 LAB — GLUCOSE, CAPILLARY
Glucose-Capillary: 107 mg/dL — ABNORMAL HIGH (ref 70–99)
Glucose-Capillary: 114 mg/dL — ABNORMAL HIGH (ref 70–99)
Glucose-Capillary: 81 mg/dL (ref 70–99)
Glucose-Capillary: 98 mg/dL (ref 70–99)
Glucose-Capillary: 98 mg/dL (ref 70–99)

## 2012-06-26 LAB — BASIC METABOLIC PANEL
BUN: 6 mg/dL (ref 6–23)
CO2: 26 mEq/L (ref 19–32)
Calcium: 8.2 mg/dL — ABNORMAL LOW (ref 8.4–10.5)
GFR calc non Af Amer: 89 mL/min — ABNORMAL LOW (ref >90)
Glucose, Bld: 99 mg/dL (ref 70–99)

## 2012-06-26 MED ORDER — POTASSIUM CHLORIDE CRYS ER 20 MEQ PO TBCR
40.0000 meq | EXTENDED_RELEASE_TABLET | Freq: Two times a day (BID) | ORAL | Status: AC
  Administered 2012-06-26 (×2): 40 meq via ORAL
  Filled 2012-06-26 (×2): qty 2

## 2012-06-26 MED ORDER — KCL IN DEXTROSE-NACL 20-5-0.45 MEQ/L-%-% IV SOLN
INTRAVENOUS | Status: DC
  Administered 2012-06-26 – 2012-06-28 (×2): via INTRAVENOUS
  Filled 2012-06-26 (×3): qty 1000
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
## 2012-06-26 NOTE — Care Management Note (Addendum)
Page 1 of 2   08/09/2012     1:46:07 PM   CARE MANAGEMENT NOTE 08/09/2012  Patient:  Chad Olson, Chad Olson   Account Number:  [id number]  Date Initiated:  06/23/2012  Documentation initiated by:  [facility]  Subjective/Objective Assessment:   67 year old male admitted with fever.     Action/Plan:   From home.   Anticipated DC Date:  08/09/2012   Anticipated DC Plan:  HOME W HOME HEALTH SERVICES      DC Planning Services  CM consult      Choice offered to / List presented to:  C-1 Patient        HH arranged  HH-2 PT  HH-1 RN  HH-3 OT      [facility] agency  [facility]   Status of service:  Completed, signed off Medicare Important Message given?  NA - LOS <3 / Initial given by admissions (If response is "NO", the following Medicare IM given date fields will be blank) Date Medicare IM given:   Date Additional Medicare IM given:    Discharge Disposition:  HOME W HOME HEALTH SERVICES  Per UR Regulation:  Reviewed for med. necessity/level of care/duration of stay  If discussed at Long Length of Stay Meetings, dates discussed:   06/27/2012  07/02/2012  07/09/2012  07/11/2012  07/18/2012  07/23/2012  07/25/2012  08/01/2012  08/06/2012  08/08/2012    Comments:  08/09/12 Alfhild Partch RN,BSN NCM [id number] D/C HOME TODAY W/HHRN/PT/OT.[facility] KRISTEN AWARE OF D/C & HH ORDERS.  08/08/12 Ladye Macnaughton RN,BSN NCM [id number] FOR D/C HOME TOMORROW W/HHRN/PT/OT.[facility] FOLLOWING.  08/07/12 Chung Chagoya RN,BSN NCM [id number] MD TO EXPLORE APPETITE CONCERNS W/PATIENT,?PSYCH INPUT. TPN D/C,IVF D/C.NO APPETITE,ON MARINOL,REG DIET,ENSURE ORDERED TID, RD FOLLOWING.AMBULATING.[facility] FOLLOWING,HHRN-CAN MONITOR NUTRITIONAL STATUS.  08/05/12 Saavi Mceachron RN,BSN NCM [id number] PELVIC DRAIN-D/C,L PCN D/C.LOOSE STOOLS,APPETITE STILL POOR,STARTED ON MARINOL,TPN.RD FOLLOWING.[facility] FOLLOWING IF D/C PLAN IS HOME.  08/01/12 Apolonia Ellwood RN,BSN NCM [phone] POD#20 PELVIC HEMATOMA DRAIN-MINIMAL OUT PUT.FOR REPEAT CT  ABD/PELVIS IN AM.POD#7-FLEX SIG-IMPROVED RECTAL TISSUE& DECREASED COMPRESSION.PICC-TPN @ 40CC/HR.APPETITE IMPROVING PER STAFF.RD FOLLOWING-ENSURE,GOAL IS EATING 50% PO,REG DIET.NO N/V.LOOSE STOOLS, C DIFF NEG.IVF@100 ,IV CIPRO Q12.PT-SNF.[facility] FOLLOWING IF HOME.NOT LTAC APPROPRIATE.[facility] DECLINED ACCEPTANCE.  07/29/12 Lyman Balingit RN,BSN NCM [id number] N/V/D RESOLVED,HAD BM,APPETITE IMPROVING.TPN@ 40,ENSURE,EATING REG DIET,PELVIC DRAIN INTACT.URO/GI/IR FOLLOWING.NOTED [facility] HAS NOT ACCEPTED FOR TRANSFER.TO CONSIDER [facility].LAST PT NOTE RECOMMENDING SNF.[facility] STILL FOLLOWING IF IMPROVES TO GO HOME W/HH.  07/25/12 Angala Hilgers RN,BSN NCM [phone] N/V/D,POOR PO INTAKE.TPN STARTED VIA PICC.PELVIC DRAIN INTACT-5CC OUTPUT.NEPHROSTOMY TUBES-F/C INTACT.PT-SNF.GI/IR FOLLOWING.  07/23/12 Kenley Troop RN,BSN NCM [phone] IR-POD#11-PELVIC HEMATOMA DRAIN-DECREASED IN SIZE,DRAIN INTACT-10-20CC OUT TODAY.NAUSEA,POOR PO INTAKE,DIARRHEA,GI-?SIGMOIDOSCOPY,?REPLACE RECTAL TUBE.NUTRITION FOLLOWING-CALORIE COUNT,ENSURE TID,LOW FIBER DIET.PT-SNF.SNF UNABLE TO ACCEPT WITH RECTAL TUE.[facility] STILL FOLLOWING & CAN MANAGE RECTAL TUBE IF BEDBOUND.  07/22/12 Eathan Groman RN,BSN NCM [phone] RECTAL TUBE D/C,DIARRHEA,POD#10 PELVIC DRAIN IN PLACE-20CC DARK BLOOD.NAUSEA-ZOFRAN IV,NUTRITION-CALOREI COUNT,AMBULATED HALLS OVER WEEKEND.D/C PLAN HOME W/HH.[facility] ALREADY FOLLOWING.  07/18/12 Bular Hickok MAAHBIR RN,BSN NCM [id number] ?MAY D/C RECTAL TUBE.PELVIC DRAIN IN PLACE. IV REGLAN/ZOFRAN,NUTRITIONAL ISSUES,PAIN CONTROL ISSUES.Marland KitchenPT-SNF/HH.  07/15/12 Garl Speigner RN,BSN NCM [phone] ?SX,GI/IR FOLLOWING,RECTAL TUBE.PT-HH.[facility] FOLLOWING CAN MANAGE RECTAL TUBE @ HOME IF BEDBOUND. NOT APPROPRIATE FOR LTAC. SNF-CANNOT ACCEPT IF RECTAL TUBE IN PLACE.  07/11/12 Eleanna Theilen RN,BSN NCM [phone] D/C NGT,TPN,CLEARS,C DIFF,IV ABX.PT-SNF.SW FOLLOWING FOR SNF.[facility] ACTIVE & FOLLOWING IF D/C PLAN FOR HOME.  07/09/12 Deya Bigos RN,BSN NCM [id number] TPN,C DIFF,IV  ABX.D/C PLAN SNF IS MORE PREFERRED BY  PATIENT.  07/02/12 Laporsha Grealish RN,BSN NCM [phone] EGD-GASTROPARESIS. HAD BM TODAY.D/C SNF WHEN STABLE.  07/01/12 Tiersa Dayley RN,BSN NCM [phone] PT-SNF.AWAITING BM,FOR EGD IN AM.  06/26/12 Javed Cotto RN,BSN NCM [id number] ACTIVE W/[facility], SPOKE TO KRISTEN ALREADY FOLLOWING FOR HHRN/PT/OT IF NEEDED FOR HOME.IR CONS?ASPIRATION,& DRAIN.

## 2012-06-26 NOTE — Progress Notes (Signed)
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 Healed well initially and sent home 6/17.  2 - Fever / h/o Bacteruria - Pt with MRSA in urine by hospital UCX 03/2012 and again subsequently in blood. Now on Vancomycin daily per ID x 4-6 week total (nearing end of course). Most recent UCX, BCX negative, New set pending from 6/22. Pt readmitted with low-grade fever 6/22 to 100.7 and malaise, no sig leukocytosis. Now on Vanc + Zosyn emirically. UCX with yeast (now getting diflucan x 3 days), BCX no growth to date. ID now following and agress pelvic fluid sample to verify no infection.  3 - Heme / Recent DVT + PE -incidental PE by CT 03/2012 now on Lovenox, DVT resolved by most recent LE duplex. Repeat CT-PE 6/23 without PE or active bleed from pelvis and Lovenox DC'd. Hgb drift to 7 6/24 and transfused 2 u with response to 8.   4 - Pelvic Fluid Collection - Pelvic fluid collection without rim-enhancement noted on CT from ER 6/22. Appears partially simple fluid with some dependent blood by HU analysis, no active extrav of blood or urine by imaging. Plan for IR drain v. Aspiration today to check fluid gram / CX / Cr.  5 - Hemorrhoids / Lower GI Bleed - Long h/o hemorroids. Pt with worsened / friable bleeding hemorroids on admission that are quite bothersome. No prior specific intervention, likely exacerbated by pelvic fluid collection / hematoma. Gen Surg recs conservative measures.  PMH sig for obesity and left knee replacement, PE + DVT (now resolved). No CV disease.   Today [place] is still c/o hemorrhoids as most botherseome. No fevers. Hgb  responding to transfusion.  Objective: Vital signs in last 24 hours: Temp:  [97.9 F (36.6 C)-99.5 F (37.5 C)] 97.9 F (36.6 C) (06/25 1352) Pulse Rate:  [61-108] 78 (06/25 1352) Resp:  [16-20] 20 (06/25 1352) BP: (98-144)/(57-81) 126/78 mmHg (06/25 1352) SpO2:  [98 %-100 %] 100 % (06/25 1352) Last BM Date: 06/25/12  Intake/Output from previous day: 06/24 0701 - 06/25 0700 In: 5557.5 [P.O.:360; I.V.:3747.5; Blood:650; IV Piggyback:800] Out: 4025 [Urine:4025] Intake/Output this shift: Total I/O In: 507.5 [P.O.:120; Blood:387.5] Out: 2000 [Urine:2000]  General appearance: alert, cooperative, appears stated age and multiple family members at bedside Head: Normocephalic, without obvious abnormality, atraumatic Eyes: conjunctivae/corneas clear. PERRL, EOM's intact. Fundi benign. Ears: normal TM's and external ear canals both ears Nose: Nares normal. Septum midline. Mucosa normal. No drainage or sinus tenderness. Throat: lips, mucosa, and tongue normal; teeth and gums normal Neck: no adenopathy, no carotid bruit, no JVD, supple, symmetrical, trachea midline and thyroid not enlarged, symmetric, no tenderness/mass/nodules Back: symmetric, no curvature. ROM normal. No CVA tenderness. Resp: clear to auscultation bilaterally Cardio: regular rate and rhythm, S1, S2 normal, no murmur, click, rub or gallop GI: soft, non-tender; bowel sounds normal; no masses,  no organomegaly and Significant hemorriods improved somewhat. ecchymoses of perineum noted and stable Male genitalia: normal Extremities: extremities normal, atraumatic, no cyanosis or edema and RUC PICC c/d/i. no erythema. Pulses: 2+ and symmetric Skin: Skin color, texture, turgor normal. No rashes or lesions Lymph nodes: Cervical, supraclavicular, and axillary nodes normal. Neurologic: Grossly normal Incision/Wound: REcent lower midline incision c/d/i. RLQ urostomy pink + patent with bilateral  bander stents in place. Lt neph tube  capped.   Lab Results:   Recent Labs  06/25/12 0440 06/25/12 2237 06/26/12 0535  WBC 11.9*  --  11.2*  HGB 7.0* 8.3* 7.9*  HCT 21.6* 25.1* 24.5*  PLT 233  --  230   BMET  Recent Labs  06/25/12 2237 06/26/12 0535  NA 140 139  K 3.2* 3.0*  CL 107 106  CO2 26 26  GLUCOSE 99 99  BUN 6 6  CREATININE 0.83 0.84  CALCIUM 8.5 8.2*   PT/INR  Recent Labs  06/26/12 0805  LABPROT 15.3*  INR 1.23   ABG No results found for this basename: PHART, PCO2, PO2, HCO3,  in the last 72 hours  Studies/Results: No results found.  Anti-infectives: Anti-infectives   Start     Dose/Rate Route Frequency Ordered Stop   06/25/12 1000  vancomycin (VANCOCIN) 1,250 mg in sodium chloride 0.9 % 250 mL IVPB     1,250 mg 166.7 mL/hr over 90 Minutes Intravenous Every 12 hours 06/25/12 0856     06/24/12 2000  fluconazole (DIFLUCAN) IVPB 200 mg     200 mg 100 mL/hr over 60 Minutes Intravenous Every 24 hours 06/24/12 1733 06/27/12 1959   06/23/12 0900  vancomycin (VANCOCIN) 1,250 mg in sodium chloride 0.9 % 250 mL IVPB     1,250 mg 166.7 mL/hr over 90 Minutes Intravenous Every 12 hours 06/23/12 0522 06/24/12 2231   06/23/12 0600  piperacillin-tazobactam (ZOSYN) IVPB 3.375 g     3.375 g 12.5 mL/hr over 240 Minutes Intravenous 3 times per day 06/23/12 0447     06/23/12 0330  cefTRIAXone (ROCEPHIN) 1 g in dextrose 5 % 50 mL IVPB     1 g 100 mL/hr over 30 Minutes Intravenous  Once 06/23/12 0319 06/23/12 0440      Assessment/Plan:   1 - Bladder Cancer / Cystectomy / Ureteral Revision- NED form cancer perspective. Will consider restudying ureteral anastamoses while in house with loopogram once CX final and Hgb stable.  2 - Fever / h/o Bacteruria - f/u new CX. Proceed wtih drain / aspirate pelvic fluid no hgb stabilizing.  3 - Heme / Recent DVT + PE -Lovenox now stopped. SCD's in place. STRONGLY encouraged OOB to chair and ambulation. Transfuse additional 2uPRBC today adn recheck post.   4 -  Pelvic Fluid Collection - DDx included post-op changes v. Urinoma v. dev'p abscess. Will proceed with IR drain today.  5 - Hemorrhoids / Lower GI Bleed - AGree with conservative measures for now. Transfuse as per above. Again, will consider pelvic drain to decrease pressure on rectal venous return on ce hgb stable.  Greatly appreciate hospitalist, gen surgery, ID input.  [facility], Veronnica Hennings 06/26/2012
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
## 2012-06-26 NOTE — Progress Notes (Signed)
Patient and sister in room. Spouse had stepped out. Offered support. Sister did most of the talking. She talked about the concerns they have regarding patient's illness and that they are hoping this hospital stay will "get to the bottom of things" and help them "understand better what is going on." Patient and sister said they have good support. A pastor came this morning and another is coming this afternoon. Patient is a member of [employer] in [place]. Will continue to follow. Listening; presence.

## 2012-06-26 NOTE — Progress Notes (Signed)
[facility] PROGRESS NOTE  Chad Olson:[id number] DOB: [birth year] DOA: 06/22/2012 PCP: [name], MD  Assessment/Plan: Hx of DVT and bilateral PE  -Recent Doppler of his lower extremity was negative for DVT.  -Repeated a CT angiogram of the chest which was negative for PE.  -Patient received 3 Month treatment for PE. -Agree with stopping Lovenox.  -I have consulted hematology regarding IVC filter.  Does patient need IVC filter placement ? Patient with history of DVT and Bilateral PE.  -SCDs.   Fever with mild leucocytosis  No clear source.  IV vancomycin for MRSA bacteremia since may.  blood culture so far negative. Urine cx growing yeast and started on fluconazole. Appreciate Dr Orvan Falconer recommendation. Will continue with IV antibiotics until culture available from pelvic fluids.  For CT guide aspiration of pelvic hematoma 6-25.   Anemia  Getting PRBC transfusion.   hemorrhoids  General surgery consulted and recommended sitz bath and ice packs for now. continue anusol rectal creams. Hypokalemia  40 Meq K-CL times 2  ordered.  Hypertension  Blood pressure stable. Continue metoprolol  Hyperlipidemia  Continue statin.  bladder cancer  Treatment per primary team.    Procedures:  CT guide aspirate of pelvic hematoma.   Antibiotics:  Vancomycin 6-22  Zosyn 6-22  HPI/Subjective: No complaints.   Objective: Filed Vitals:   06/26/12 1005 06/26/12 1105 06/26/12 1205 06/26/12 1352  BP: 142/76 126/71 128/70 126/78  Pulse: 85 78 80 78  Temp: 98.8 F (37.1 C) 98.1 F (36.7 C) 98.5 F (36.9 C) 97.9 F (36.6 C)  TempSrc: Oral Oral Oral Oral  Resp: 18 18 18 20   Height:      Weight:      SpO2:    100%    Intake/Output Summary (Last 24 hours) at 06/26/12 1634 Last data filed at 06/26/12 1403  Gross per 24 hour  Intake 2707.5 ml  Output   4925 ml  Net -2217.5 ml   Filed Weights   06/23/12 0405 06/23/12 0449  Weight: 103.2 kg (227 lb 8.2 oz)  100.109 kg (220 lb 11.2 oz)    Exam:   General:  No distress.   Cardiovascular: S 1, S 2 RRR  Respiratory: CTA  Abdomen: BS present, soft, NT  Musculoskeletal: no edema.   Data Reviewed: Basic Metabolic Panel:  Recent Labs Lab 06/23/12 0530 06/24/12 0412 06/25/12 0440 06/25/12 2237 06/26/12 0535  NA 136 135 137 140 139  K 3.3* 3.4* 2.9* 3.2* 3.0*  CL 100 100 105 107 106  CO2 26 24 24 26 26   GLUCOSE 170* 138* 130* 99 99  BUN 7 10 8 6 6   CREATININE 0.99 1.05 1.03 0.83 0.84  CALCIUM 8.9 9.2 8.5 8.5 8.2*   Liver Function Tests:  Recent Labs Lab 06/22/12 2243 06/23/12 0530  AST 15 16  ALT 11 12  ALKPHOS 65 61  BILITOT 0.3 0.3  PROT 7.2 6.7  ALBUMIN 3.1* 3.0*   No results found for this basename: LIPASE, AMYLASE,  in the last 168 hours No results found for this basename: AMMONIA,  in the last 168 hours CBC:  Recent Labs Lab 06/22/12 2243 06/23/12 0530 06/24/12 0412 06/25/12 0440 06/25/12 2237 06/26/12 0535  WBC 9.8 11.5* 13.7* 11.9*  --  11.2*  NEUTROABS 6.6  --   --   --   --   --   HGB 11.6* 10.5* 8.9* 7.0* 8.3* 7.9*  HCT 35.5* 32.0* 27.8* 21.6* 25.1* 24.5*  MCV 90.3 90.1 89.4  90.8  --  90.1  PLT 349 360 378 233  --  230   Cardiac Enzymes: No results found for this basename: CKTOTAL, CKMB, CKMBINDEX, TROPONINI,  in the last 168 hours BNP (last 3 results) No results found for this basename: PROBNP,  in the last 8760 hours CBG:  Recent Labs Lab 06/25/12 2001 06/25/12 2359 06/26/12 0409 06/26/12 0755 06/26/12 1136  GLUCAP 170* 81 98 117* 98    Recent Results (from the past 240 hour(s))  CULTURE, BLOOD (ROUTINE X 2)     Status: None   Collection Time    06/22/12 10:43 PM      Result Value Range Status   Specimen Description BLOOD LEFT ANTECUBITAL   Final   Special Requests BOTTLES DRAWN AEROBIC AND ANAEROBIC 5CC   Final   Culture  Setup Time 06/23/2012 17:47   Final   Culture     Final   Value:        BLOOD CULTURE RECEIVED NO GROWTH  TO DATE CULTURE WILL BE HELD FOR 5 DAYS BEFORE ISSUING A FINAL NEGATIVE REPORT   Report Status PENDING   Incomplete  CULTURE, BLOOD (ROUTINE X 2)     Status: None   Collection Time    06/22/12 10:57 PM      Result Value Range Status   Specimen Description BLOOD LEFT ARM   Final   Special Requests BOTTLES DRAWN AEROBIC AND ANAEROBIC 10CC   Final   Culture  Setup Time 06/23/2012 17:47   Final   Culture     Final   Value:        BLOOD CULTURE RECEIVED NO GROWTH TO DATE CULTURE WILL BE HELD FOR 5 DAYS BEFORE ISSUING A FINAL NEGATIVE REPORT   Report Status PENDING   Incomplete  URINE CULTURE     Status: None   Collection Time    06/23/12  2:17 AM      Result Value Range Status   Specimen Description URINE, CATHETERIZED   Final   Special Requests Normal   Final   Culture  Setup Time 06/23/2012 15:21   Final   Colony Count 35,000 COLONIES/ML   Final   Culture YEAST   Final   Report Status 06/24/2012 FINAL   Final  MRSA PCR SCREENING     Status: None   Collection Time    06/24/12  9:58 AM      Result Value Range Status   MRSA by PCR NEGATIVE  NEGATIVE Final   Comment:            The GeneXpert MRSA Assay (FDA     approved for NASAL specimens     only), is one component of a     comprehensive MRSA colonization     surveillance program. It is not     intended to diagnose MRSA     infection nor to guide or     monitor treatment for     MRSA infections.     Studies: No results found.  Scheduled Meds: . acidophilus  1 capsule Oral Daily  . bisacodyl  10 mg Rectal BID  . feeding supplement  237 mL Oral BID BM  . feeding supplement  1 Container Oral BID BM  . fluconazole (DIFLUCAN) IV  200 mg Intravenous Q24H  . hydrocortisone   Rectal TID  . insulin aspart  0-15 Units Subcutaneous Q4H  . metoprolol tartrate  12.5 mg Oral BID  . multivitamin with minerals  1 tablet  Oral Daily  . pantoprazole  40 mg Oral Daily  . piperacillin-tazobactam (ZOSYN)  IV  3.375 g Intravenous Q8H  .  potassium chloride  40 mEq Oral BID  . senna-docusate  1 tablet Oral BID  . simvastatin  10 mg Oral q1800  . vancomycin  1,250 mg Intravenous Q12H   Continuous Infusions: . dextrose 5 % and 0.45 % NaCl with KCl 20 mEq/L      Active Problems:   Bladder cancer   Acute pulmonary embolism (3/14)   DVT (deep venous thrombosis) 3/14   Fever   Bacteremia MRSA 5/14   VTE (venous thromboembolism)   Protein-calorie malnutrition, severe   Acute blood loss anemia   Pelvic hematoma, male    Time spent: 25 minutes.     Via Rosado  [facility] Pager [phone]. If 7PM-7AM, please contact night-coverage at [email or web address], password [facility] 06/26/2012, 4:34 PM  LOS: 4 days

## 2012-06-26 NOTE — Plan of Care (Signed)
Problem: Phase II Progression Outcomes Goal: Voiding independently Outcome: Not Applicable Date Met:  06/26/12 Has ileal conduit with a urostomy to straight drainage bag

## 2012-06-26 NOTE — Progress Notes (Signed)
NUTRITION FOLLOW UP  Intervention:   - Diet advancement per MD - Spiritual consult for emotional/spiritual needs of family as they are going through a rough time with pt's illness  - Will continue to monitor   Nutrition Dx:   Inadequate oral intake related to poor appetite as evidenced by <10% meal intake - no longer appropriate, pt now NPO.   New nutrition dx: Inadequate oral intake related to inability to eat as evidenced by NPO.    Goal:   1. No further nausea/vomiting - not met, pt with nausea 2. Pt to consume >90% of meals/supplements - not met, pt NPO  New goal: Advance diet as tolerated to diabetic diet  Monitor:   Weights, labs, nausea, diet advancement  Assessment:   Pt discussed during interdisciplinary rounds. Pt found to have large pelvic collection on CT, hematoma (? Infected) versus abscess per PA notes. Pt getting blood transfusion for hemoglobin of 7.9 g/dL this morning. Plan for possible aspiration versus drain placement.   Met with pt who was resting, family member present at beside. She reports pt ate a little bit of a tuna sub yesterday and drank 75% of [employer]. She reports pt ate 1/2 of egg sandwich from [employer] this morning. She reports pt still having nausea, but no further vomiting. She thinks the hematoma contributing to the nausea. Pt currently NPO. Pt's potassium low this morning. Family member upset at the thought of pt's illness and suffering, emotional support provided.   Height: Ht Readings from Last 1 Encounters:  06/23/12 5\' 11"  (1.803 m)    Weight Status:   Wt Readings from Last 1 Encounters:  06/23/12 220 lb 11.2 oz (100.109 kg)    Re-estimated needs:  Kcal: 1950-2100  Protein: 80-95g  Fluid: 1.9-2.1L/day   Skin: Mid abdominal incision, right buttocks abrasion, back left skin tear    Diet Order: NPO   Intake/Output Summary (Last 24 hours) at 06/26/12 1002 Last data filed at 06/26/12 0905  Gross per 24 hour  Intake    5575 ml  Output   4025 ml  Net   1550 ml    Last BM: 6/24   Labs:   Recent Labs Lab 06/25/12 0440 06/25/12 2237 06/26/12 0535  NA 137 140 139  K 2.9* 3.2* 3.0*  CL 105 107 106  CO2 24 26 26   BUN 8 6 6   CREATININE 1.03 0.83 0.84  CALCIUM 8.5 8.5 8.2*  GLUCOSE 130* 99 99    CBG (last 3)   Recent Labs  06/25/12 2359 06/26/12 0409 06/26/12 0755  GLUCAP 81 98 117*    Scheduled Meds: . acidophilus  1 capsule Oral Daily  . bisacodyl  10 mg Rectal BID  . feeding supplement  237 mL Oral BID BM  . feeding supplement  1 Container Oral BID BM  . fluconazole (DIFLUCAN) IV  200 mg Intravenous Q24H  . hydrocortisone   Rectal TID  . insulin aspart  0-15 Units Subcutaneous Q4H  . metoprolol tartrate  12.5 mg Oral BID  . multivitamin with minerals  1 tablet Oral Daily  . pantoprazole  40 mg Oral Daily  . piperacillin-tazobactam (ZOSYN)  IV  3.375 g Intravenous Q8H  . potassium chloride  40 mEq Oral BID  . senna-docusate  1 tablet Oral BID  . simvastatin  10 mg Oral q1800  . vancomycin  1,250 mg Intravenous Q12H    Continuous Infusions: . dextrose 5 % and 0.45 % NaCl with KCl 20 mEq/L  Salli Bodin Baron MS, RD, LDN [phone] Pager [phone] After Hours Pager  

## 2012-06-26 NOTE — Progress Notes (Signed)
Patient ID: Chad Olson, male   DOB: 30-Apr-1945, 67 y.o.   MRN: [id number]         [facility]    Date of Admission:  06/22/2012           Day 43 vancomycin        Day 3 piperacillin tazobactam        Day 3 fluconazole Active Problems:   Bladder cancer   Acute pulmonary embolism (3/14)   DVT (deep venous thrombosis) 3/14   Fever   Bacteremia MRSA 5/14   VTE (venous thromboembolism)   Protein-calorie malnutrition, severe   Acute blood loss anemia   . acidophilus  1 capsule Oral Daily  . bisacodyl  10 mg Rectal BID  . feeding supplement  237 mL Oral BID BM  . feeding supplement  1 Container Oral BID BM  . fluconazole (DIFLUCAN) IV  200 mg Intravenous Q24H  . hydrocortisone   Rectal TID  . insulin aspart  0-15 Units Subcutaneous Q4H  . metoprolol tartrate  12.5 mg Oral BID  . multivitamin with minerals  1 tablet Oral Daily  . pantoprazole  40 mg Oral Daily  . piperacillin-tazobactam (ZOSYN)  IV  3.375 g Intravenous Q8H  . potassium chloride  40 mEq Oral BID  . senna-docusate  1 tablet Oral BID  . simvastatin  10 mg Oral q1800  . vancomycin  1,250 mg Intravenous Q12H    Subjective: [name] had increased scrotal swelling and pain overnight associated with some nausea.  Objective: Temp:  [98.1 F (36.7 C)-100.1 F (37.8 C)] 98.8 F (37.1 C) (06/25 1005) Pulse Rate:  [61-108] 85 (06/25 1005) Resp:  [16-18] 18 (06/25 1005) BP: (98-144)/(57-81) 142/76 mmHg (06/25 1005) SpO2:  [98 %-100 %] 98 % (06/25 0553)  General: He is uncomfortable due to pain that he currently rates at 4/10 after pain medication this morning. Skin: No rash Lungs: Clear Cor: Regular S1 and S2 no murmurs Abdomen: Soft and nontender. Lower midline incision looks good with staples in. Right lower quadrant urostomy with ureteral stents visible and bag. Diffuse ecchymoses and moderate swelling of scrotum.   Lab Results Lab Results  Component Value Date   WBC 11.2*  06/26/2012   HGB 7.9* 06/26/2012   HCT 24.5* 06/26/2012   MCV 90.1 06/26/2012   PLT 230 06/26/2012    Microbiology: Recent Results (from the past 240 hour(s))  CULTURE, BLOOD (ROUTINE X 2)     Status: None   Collection Time    06/22/12 10:43 PM      Result Value Range Status   Specimen Description BLOOD LEFT ANTECUBITAL   Final   Special Requests BOTTLES DRAWN AEROBIC AND ANAEROBIC 5CC   Final   Culture  Setup Time 06/23/2012 17:47   Final   Culture     Final   Value:        BLOOD CULTURE RECEIVED NO GROWTH TO DATE CULTURE WILL BE HELD FOR 5 DAYS BEFORE ISSUING A FINAL NEGATIVE REPORT   Report Status PENDING   Incomplete  CULTURE, BLOOD (ROUTINE X 2)     Status: None   Collection Time    06/22/12 10:57 PM      Result Value Range Status   Specimen Description BLOOD LEFT ARM   Final   Special Requests BOTTLES DRAWN AEROBIC AND ANAEROBIC 10CC   Final   Culture  Setup Time 06/23/2012 17:47   Final   Culture     Final  Value:        BLOOD CULTURE RECEIVED NO GROWTH TO DATE CULTURE WILL BE HELD FOR 5 DAYS BEFORE ISSUING A FINAL NEGATIVE REPORT   Report Status PENDING   Incomplete  URINE CULTURE     Status: None   Collection Time    06/23/12  2:17 AM      Result Value Range Status   Specimen Description URINE, CATHETERIZED   Final   Special Requests Normal   Final   Culture  Setup Time 06/23/2012 15:21   Final   Colony Count 35,000 COLONIES/ML   Final   Culture YEAST   Final   Report Status 06/24/2012 FINAL   Final  MRSA PCR SCREENING     Status: None   Collection Time    06/24/12  9:58 AM      Result Value Range Status   MRSA by PCR NEGATIVE  NEGATIVE Final   Comment:            The GeneXpert MRSA Assay (FDA     approved for NASAL specimens     only), is one component of a     comprehensive MRSA colonization     surveillance program. It is not     intended to diagnose MRSA     infection nor to guide or     monitor treatment for     MRSA infections.    Studies/Results: Ct  Angio Chest Pe W/cm &/or Wo Cm  06/24/2012   *RADIOLOGY REPORT*  Clinical Data: History of pulmonary emboli 03/2012, fever, pelvic pain/hematoma, lower GI bleed  CT ANGIOGRAPHY CHEST  Technique:  Multidetector CT imaging of the chest using the standard protocol during bolus administration of intravenous contrast. Multiplanar reconstructed images including MIPs were obtained and reviewed to evaluate the vascular anatomy.,  Contrast: OMNIPAQUE IOHEXOL 350 MG/ML SOLN  Comparison: None.  Findings: Right upper extremity PICC line tip extends into the SVC. Atherosclerotic changes of the thoracic aorta.  Negative for aneurysm or dissection.  Major branch vessels are patent. Visualized central pulmonary arteries are patent.  No significant proximal or hilar pulmonary embolus by CTA.  Normal heart size.  No pericardial or pleural effusion.  No hiatal hernia.  No abnormal adenopathy within the chest.  Lung windows demonstrate no focal airspace process, acute pneumonia, collapse, consolidation, edema, or interstitial process. Minor dependent basilar atelectasis.  IMPRESSION: Negative for significant acute pulmonary embolus by CTA.  Thoracic atherosclerosis without aneurysm or dissection  Minimal basilar atelectasis  No acute intrathoracic process  CT ANGIOGRAPHY ABDOMEN AND PELVIS  Technique:  Multidetector CT imaging of the abdomen and pelvis was performed using the standard protocol during bolus administration of intravenous contrast.  Multiplanar reconstructed images including MIPs were obtained and reviewed to evaluate the vascular anatomy.  Comparison:  06/23/2012, 06/02/2012  Findings:  Minor atherosclerosis of the aorta and iliac bifurcation.  Negative for aneurysm, dissection, or retroperitoneal hemorrhage.  Celiac, SMA, renal arteries, and IMA all remain patent.  Negative for renal or mesenteric vascular occlusive process.  Common, internal, and external iliac arteries are patent and the pelvis.  The visualized  common femoral, proximal superficial femoral and profunda femoral arteries visualized are also patent.  Large heterogeneous pelvic fluid collection again evident. Measuring at a similar level, this pelvic fluid collection roughly measures 15 x 8 cm.  Slight interval enlargement with more mass effect on the rectum.  No evidence of active contrast extravasation or bleeding demonstrated within the hematoma.  Layering small  gallstones noted, image 92.  No biliary dilatation. No focal hepatic abnormality.  Biliary system, pancreas, spleen, and adrenal glands are within normal limits and stable.  Stable right renal cysts.  Bilateral ureteral stents noted extending through the ileal conduit.  External left nephrostomy catheter remains.  No evidence of hydronephrosis or obstruction. Postop changes from an ileal loop conduit.  Negative for bowel obstruction, dilatation, or ileus.  Normal appendix.  Postop changes from midline laparotomy below the umbilicus.  Stable postoperative appearance of the lower abdomen and upper pelvis.  Hemorrhoids evident versus rectal prolapse.  Recommend correlation with exam.  Scrotal swelling noted with bilateral hydroceles.  Degenerative changes diffusely of the spine.   Review of the MIP images confirms the above findings.  IMPRESSION: No evidence of acute contrast extravasation or active bleeding.  Slight interval enlargement of the lower abdominal and pelvic hematoma with more mass effect on the rectum.   Original Report Authenticated By: Judie Petit. Miles Costain, M.D.   Ct Angio Abd/pel W/ And/or W/o  06/24/2012   *RADIOLOGY REPORT*  Clinical Data: History of pulmonary emboli 03/2012, fever, pelvic pain/hematoma, lower GI bleed  CT ANGIOGRAPHY CHEST  Technique:  Multidetector CT imaging of the chest using the standard protocol during bolus administration of intravenous contrast. Multiplanar reconstructed images including MIPs were obtained and reviewed to evaluate the vascular anatomy.,  Contrast:  OMNIPAQUE IOHEXOL 350 MG/ML SOLN  Comparison: None.  Findings: Right upper extremity PICC line tip extends into the SVC. Atherosclerotic changes of the thoracic aorta.  Negative for aneurysm or dissection.  Major branch vessels are patent. Visualized central pulmonary arteries are patent.  No significant proximal or hilar pulmonary embolus by CTA.  Normal heart size.  No pericardial or pleural effusion.  No hiatal hernia.  No abnormal adenopathy within the chest.  Lung windows demonstrate no focal airspace process, acute pneumonia, collapse, consolidation, edema, or interstitial process. Minor dependent basilar atelectasis.  IMPRESSION: Negative for significant acute pulmonary embolus by CTA.  Thoracic atherosclerosis without aneurysm or dissection  Minimal basilar atelectasis  No acute intrathoracic process  CT ANGIOGRAPHY ABDOMEN AND PELVIS  Technique:  Multidetector CT imaging of the abdomen and pelvis was performed using the standard protocol during bolus administration of intravenous contrast.  Multiplanar reconstructed images including MIPs were obtained and reviewed to evaluate the vascular anatomy.  Comparison:  06/23/2012, 06/02/2012  Findings:  Minor atherosclerosis of the aorta and iliac bifurcation.  Negative for aneurysm, dissection, or retroperitoneal hemorrhage.  Celiac, SMA, renal arteries, and IMA all remain patent.  Negative for renal or mesenteric vascular occlusive process.  Common, internal, and external iliac arteries are patent and the pelvis.  The visualized common femoral, proximal superficial femoral and profunda femoral arteries visualized are also patent.  Large heterogeneous pelvic fluid collection again evident. Measuring at a similar level, this pelvic fluid collection roughly measures 15 x 8 cm.  Slight interval enlargement with more mass effect on the rectum.  No evidence of active contrast extravasation or bleeding demonstrated within the hematoma.  Layering small gallstones  noted, image 92.  No biliary dilatation. No focal hepatic abnormality.  Biliary system, pancreas, spleen, and adrenal glands are within normal limits and stable.  Stable right renal cysts.  Bilateral ureteral stents noted extending through the ileal conduit.  External left nephrostomy catheter remains.  No evidence of hydronephrosis or obstruction. Postop changes from an ileal loop conduit.  Negative for bowel obstruction, dilatation, or ileus.  Normal appendix.  Postop changes from midline laparotomy  below the umbilicus.  Stable postoperative appearance of the lower abdomen and upper pelvis.  Hemorrhoids evident versus rectal prolapse.  Recommend correlation with exam.  Scrotal swelling noted with bilateral hydroceles.  Degenerative changes diffusely of the spine.   Review of the MIP images confirms the above findings.  IMPRESSION: No evidence of acute contrast extravasation or active bleeding.  Slight interval enlargement of the lower abdominal and pelvic hematoma with more mass effect on the rectum.   Original Report Authenticated By: Judie Petit. Miles Costain, M.D.    Assessment: I suspect that his new pelvic fluid collection is a sterile hematoma but certainly agree with continuing antibiotic therapy while waiting on results of percutaneous aspiration later today.  Plan: 1. Continue current antibiotic therapy 2. Pelvic fluid collection to be aspirated later today  Cliffton Asters, MD [facility] [phone] pager   [phone] cell 06/26/2012, 10:42 AM

## 2012-06-27 ENCOUNTER — Ambulatory Visit: Payer: BC Managed Care – PPO | Admitting: Internal Medicine

## 2012-06-27 ENCOUNTER — Inpatient Hospital Stay (HOSPITAL_COMMUNITY): Payer: BC Managed Care – PPO

## 2012-06-27 LAB — TYPE AND SCREEN
ABO/RH(D): A POS
Antibody Screen: NEGATIVE
Unit division: 0

## 2012-06-27 LAB — BASIC METABOLIC PANEL
BUN: 6 mg/dL (ref 6–23)
Chloride: 104 mEq/L (ref 96–112)
GFR calc Af Amer: >90 mL/min (ref >90)
GFR calc non Af Amer: 87 mL/min — ABNORMAL LOW (ref >90)
Potassium: 2.9 mEq/L — ABNORMAL LOW (ref 3.5–5.1)
Sodium: 138 mEq/L (ref 135–145)

## 2012-06-27 LAB — CBC
HCT: 30.6 % — ABNORMAL LOW (ref 39.0–52.0)
Hemoglobin: 9.9 g/dL — ABNORMAL LOW (ref 13.0–17.0)
MCHC: 32.4 g/dL (ref 30.0–36.0)
WBC: 11.7 10*3/uL — ABNORMAL HIGH (ref 4.0–10.5)

## 2012-06-27 LAB — GLUCOSE, CAPILLARY
Glucose-Capillary: 100 mg/dL — ABNORMAL HIGH (ref 70–99)
Glucose-Capillary: 114 mg/dL — ABNORMAL HIGH (ref 70–99)
Glucose-Capillary: 95 mg/dL (ref 70–99)

## 2012-06-27 MED ORDER — POTASSIUM CHLORIDE CRYS ER 20 MEQ PO TBCR
40.0000 meq | EXTENDED_RELEASE_TABLET | Freq: Three times a day (TID) | ORAL | Status: AC
  Administered 2012-06-27 (×3): 40 meq via ORAL
  Filled 2012-06-27 (×3): qty 2

## 2012-06-27 MED ORDER — MIDAZOLAM HCL 2 MG/2ML IJ SOLN
INTRAMUSCULAR | Status: AC | PRN
  Administered 2012-06-27 (×2): 1 mg via INTRAVENOUS

## 2012-06-27 MED ORDER — FENTANYL CITRATE 0.05 MG/ML IJ SOLN
INTRAMUSCULAR | Status: AC | PRN
  Administered 2012-06-27: 50 ug via INTRAVENOUS
  Administered 2012-06-27: 100 ug via INTRAVENOUS
  Administered 2012-06-27: 50 ug via INTRAVENOUS

## 2012-06-27 MED ORDER — POLYETHYLENE GLYCOL 3350 17 G PO PACK
17.0000 g | PACK | Freq: Every day | ORAL | Status: DC
  Administered 2012-06-28 – 2012-06-29 (×2): 17 g via ORAL
  Filled 2012-06-27 (×3): qty 1

## 2012-06-27 NOTE — Progress Notes (Signed)
[facility] PROGRESS NOTE  KENDAL RAFFO WJX:[id number] DOB: Jul 27, 1945 DOA: 06/22/2012 PCP: [name], MD  Assessment/Plan: Hx of DVT and bilateral PE  -Recent Doppler of his lower extremity was negative for DVT.  -Repeated a CT angiogram of the chest which was negative for PE.  -Patient received 3 Month treatment for PE. -Agree with stopping Lovenox.  -Appreciate Dr Cyndie Chime recommendation. No indication for IVC filter.  -Will repeat CT scan and follow HB trend to consider prophylaxis dose heparin.  -SCDs.   Pelvic hematoma: Patient relates worsening abdominal pain. Will order CT abdomen and pelvis to follow up on hematoma and further evaluate pain.   Fever with mild leucocytosis  No clear source.  IV vancomycin for MRSA bacteremia since may.  blood culture so far negative. Urine cx growing yeast and started on fluconazole. Appreciate Dr Orvan Falconer recommendation. Will continue with IV antibiotics until culture available from pelvic fluids.  For CT guide aspiration of pelvic hematoma hopefully today 6-26  Anemia  Received 2 units 6-25.  hemorrhoids  General surgery consulted and recommended sitz bath and ice packs for now. continue anusol rectal creams. Hypokalemia  40 Meq K-CL times 3 ordered.  Hypertension  Blood pressure stable. Continue metoprolol  Hyperlipidemia  Continue statin.  bladder cancer  Treatment per primary team.    Procedures:  CT guide aspirate of pelvic hematoma.   Antibiotics:  Vancomycin 6-22  Zosyn 6-22  HPI/Subjective: He is complaining today of right side abdominal pain. He feels pressure. He was having pain all night.   Objective: Filed Vitals:   06/26/12 1735 06/26/12 1835 06/26/12 2013 06/27/12 0428  BP: 112/72 126/65 129/75 141/81  Pulse: 100 96 84 82  Temp: 98.7 F (37.1 C) 98.7 F (37.1 C) 98.2 F (36.8 C) 98.1 F (36.7 C)  TempSrc: Oral Oral Oral Oral  Resp: 18 18 18 18   Height:      Weight:      SpO2:   97%  97%    Intake/Output Summary (Last 24 hours) at 06/27/12 1034 Last data filed at 06/27/12 0700  Gross per 24 hour  Intake 1997.5 ml  Output   4475 ml  Net -2477.5 ml   Filed Weights   06/23/12 0405 06/23/12 0449  Weight: 103.2 kg (227 lb 8.2 oz) 100.109 kg (220 lb 11.2 oz)    Exam:   General:  No distress.   Cardiovascular: S 1, S 2 RRR  Respiratory: CTA  Abdomen: BS present, soft, NT  Musculoskeletal: no edema.   Data Reviewed: Basic Metabolic Panel:  Recent Labs Lab 06/24/12 0412 06/25/12 0440 06/25/12 2237 06/26/12 0535 06/27/12 0415  NA 135 137 140 139 138  K 3.4* 2.9* 3.2* 3.0* 2.9*  CL 100 105 107 106 104  CO2 24 24 26 26 27   GLUCOSE 138* 130* 99 99 111*  BUN 10 8 6 6 6   CREATININE 1.05 1.03 0.83 0.84 0.90  CALCIUM 9.2 8.5 8.5 8.2* 8.4   Liver Function Tests:  Recent Labs Lab 06/22/12 2243 06/23/12 0530  AST 15 16  ALT 11 12  ALKPHOS 65 61  BILITOT 0.3 0.3  PROT 7.2 6.7  ALBUMIN 3.1* 3.0*   No results found for this basename: LIPASE, AMYLASE,  in the last 168 hours No results found for this basename: AMMONIA,  in the last 168 hours CBC:  Recent Labs Lab 06/22/12 2243 06/23/12 0530 06/24/12 0412 06/25/12 0440 06/25/12 2237 06/26/12 0535 06/27/12 0415  WBC 9.8 11.5* 13.7* 11.9*  --  11.2* 11.7*  NEUTROABS 6.6  --   --   --   --   --   --   HGB 11.6* 10.5* 8.9* 7.0* 8.3* 7.9* 9.9*  HCT 35.5* 32.0* 27.8* 21.6* 25.1* 24.5* 30.6*  MCV 90.3 90.1 89.4 90.8  --  90.1 90.5  PLT 349 360 378 233  --  230 281   Cardiac Enzymes: No results found for this basename: CKTOTAL, CKMB, CKMBINDEX, TROPONINI,  in the last 168 hours BNP (last 3 results) No results found for this basename: PROBNP,  in the last 8760 hours CBG:  Recent Labs Lab 06/26/12 1647 06/26/12 2010 06/27/12 0010 06/27/12 0427 06/27/12 0833  GLUCAP 107* 114* 95 98 114*    Recent Results (from the past 240 hour(s))  CULTURE, BLOOD (ROUTINE X 2)     Status: None    Collection Time    06/22/12 10:43 PM      Result Value Range Status   Specimen Description BLOOD LEFT ANTECUBITAL   Final   Special Requests BOTTLES DRAWN AEROBIC AND ANAEROBIC 5CC   Final   Culture  Setup Time 06/23/2012 17:47   Final   Culture     Final   Value:        BLOOD CULTURE RECEIVED NO GROWTH TO DATE CULTURE WILL BE HELD FOR 5 DAYS BEFORE ISSUING A FINAL NEGATIVE REPORT   Report Status PENDING   Incomplete  CULTURE, BLOOD (ROUTINE X 2)     Status: None   Collection Time    06/22/12 10:57 PM      Result Value Range Status   Specimen Description BLOOD LEFT ARM   Final   Special Requests BOTTLES DRAWN AEROBIC AND ANAEROBIC 10CC   Final   Culture  Setup Time 06/23/2012 17:47   Final   Culture     Final   Value:        BLOOD CULTURE RECEIVED NO GROWTH TO DATE CULTURE WILL BE HELD FOR 5 DAYS BEFORE ISSUING A FINAL NEGATIVE REPORT   Report Status PENDING   Incomplete  URINE CULTURE     Status: None   Collection Time    06/23/12  2:17 AM      Result Value Range Status   Specimen Description URINE, CATHETERIZED   Final   Special Requests Normal   Final   Culture  Setup Time 06/23/2012 15:21   Final   Colony Count 35,000 COLONIES/ML   Final   Culture YEAST   Final   Report Status 06/24/2012 FINAL   Final  MRSA PCR SCREENING     Status: None   Collection Time    06/24/12  9:58 AM      Result Value Range Status   MRSA by PCR NEGATIVE  NEGATIVE Final   Comment:            The GeneXpert MRSA Assay (FDA     approved for NASAL specimens     only), is one component of a     comprehensive MRSA colonization     surveillance program. It is not     intended to diagnose MRSA     infection nor to guide or     monitor treatment for     MRSA infections.     Studies: No results found.  Scheduled Meds: . acidophilus  1 capsule Oral Daily  . bisacodyl  10 mg Rectal BID  . feeding supplement  237 mL Oral BID BM  . feeding supplement  1 Container Oral BID BM  . hydrocortisone    Rectal TID  . insulin aspart  0-15 Units Subcutaneous Q4H  . metoprolol tartrate  12.5 mg Oral BID  . multivitamin with minerals  1 tablet Oral Daily  . pantoprazole  40 mg Oral Daily  . piperacillin-tazobactam (ZOSYN)  IV  3.375 g Intravenous Q8H  . potassium chloride  40 mEq Oral TID  . senna-docusate  1 tablet Oral BID  . simvastatin  10 mg Oral q1800  . vancomycin  1,250 mg Intravenous Q12H   Continuous Infusions: . dextrose 5 % and 0.45 % NaCl with KCl 20 mEq/L 50 mL/hr at 06/26/12 2100    Active Problems:   Bladder cancer   Acute pulmonary embolism (3/14)   DVT (deep venous thrombosis) 3/14   Fever   Bacteremia MRSA 5/14   VTE (venous thromboembolism)   Protein-calorie malnutrition, severe   Acute blood loss anemia   Pelvic hematoma, male    Time spent: 25 minutes.     Megann Easterwood  [facility] Pager [phone]. If 7PM-7AM, please contact night-coverage at [email or web address], password [facility] 06/27/2012, 10:34 AM  LOS: 5 days

## 2012-06-27 NOTE — Progress Notes (Signed)
Patient ID: Chad Olson, male   DOB: 1945/04/11, 67 y.o.   MRN: [id number]    Subjective: Pt  Unable to do sitz bathes secondary to pain, but ice feels good.  Objective: Vital signs in last 24 hours: Temp:  [97.9 F (36.6 C)-99.1 F (37.3 C)] 98.1 F (36.7 C) (06/26 0428) Pulse Rate:  [78-100] 82 (06/26 0428) Resp:  [18-20] 18 (06/26 0428) BP: (112-142)/(65-81) 141/81 mmHg (06/26 0428) SpO2:  [97 %-100 %] 97 % (06/26 0428) Last BM Date: 06/25/12  Intake/Output from previous day: 06/25 0701 - 06/26 0700 In: 1997.5 [P.O.:360; I.V.:500; Blood:387.5; IV Piggyback:750] Out: 4475 [Urine:4475] Intake/Output this shift:    PE: GU: scrotum enlarging secondary to hematoma, rectum and perineum appear improved with some retraction of ecchymosis in the gluteal region, more focused in his scrotum.  Lab Results:   Recent Labs  06/26/12 0535 06/27/12 0415  WBC 11.2* 11.7*  HGB 7.9* 9.9*  HCT 24.5* 30.6*  PLT 230 281   BMET  Recent Labs  06/26/12 0535 06/27/12 0415  NA 139 138  K 3.0* 2.9*  CL 106 104  CO2 26 27  GLUCOSE 99 111*  BUN 6 6  CREATININE 0.84 0.90  CALCIUM 8.2* 8.4   PT/INR  Recent Labs  06/26/12 0805  LABPROT 15.3*  INR 1.23   CMP     Component Value Date/Time   NA 138 06/27/2012 0415   NA 139 04/18/2012 0801   K 2.9* 06/27/2012 0415   K 3.9 04/18/2012 0801   CL 104 06/27/2012 0415   CL 105 04/18/2012 0801   CO2 27 06/27/2012 0415   CO2 22 04/18/2012 0801   GLUCOSE 111* 06/27/2012 0415   GLUCOSE 121* 04/18/2012 0801   BUN 6 06/27/2012 0415   BUN 9.1 04/18/2012 0801   CREATININE 0.90 06/27/2012 0415   CREATININE 1.1 04/18/2012 0801   CALCIUM 8.4 06/27/2012 0415   CALCIUM 9.3 04/18/2012 0801   PROT 6.7 06/23/2012 0530   PROT 7.3 04/18/2012 0801   ALBUMIN 3.0* 06/23/2012 0530   ALBUMIN 2.7* 04/18/2012 0801   AST 16 06/23/2012 0530   AST 16 04/18/2012 0801   ALT 12 06/23/2012 0530   ALT 22 04/18/2012 0801   ALKPHOS 61 06/23/2012 0530   ALKPHOS 82 04/18/2012  0801   BILITOT 0.3 06/23/2012 0530   BILITOT 0.46 04/18/2012 0801   GFRNONAA 87* 06/27/2012 0415   GFRAA >90 06/27/2012 0415   Lipase     Component Value Date/Time   LIPASE 25 05/13/2012 1650       Studies/Results: No results found.  Anti-infectives: Anti-infectives   Start     Dose/Rate Route Frequency Ordered Stop   06/25/12 1000  vancomycin (VANCOCIN) 1,250 mg in sodium chloride 0.9 % 250 mL IVPB     1,250 mg 166.7 mL/hr over 90 Minutes Intravenous Every 12 hours 06/25/12 0856     06/24/12 2000  fluconazole (DIFLUCAN) IVPB 200 mg     200 mg 100 mL/hr over 60 Minutes Intravenous Every 24 hours 06/24/12 1733 06/26/12 2322   06/23/12 0900  vancomycin (VANCOCIN) 1,250 mg in sodium chloride 0.9 % 250 mL IVPB     1,250 mg 166.7 mL/hr over 90 Minutes Intravenous Every 12 hours 06/23/12 0522 06/24/12 2231   06/23/12 0600  piperacillin-tazobactam (ZOSYN) IVPB 3.375 g     3.375 g 12.5 mL/hr over 240 Minutes Intravenous 3 times per day 06/23/12 0447     06/23/12 0330  cefTRIAXone (ROCEPHIN) 1 g in  dextrose 5 % 50 mL IVPB     1 g 100 mL/hr over 30 Minutes Intravenous  Once 06/23/12 0319 06/23/12 0440       Assessment/Plan  1. Hemorrhoids secondary to pelvic hematoma  Plan: 1. Cont sitz bathes and ice packs prn.  May want to elevate the scrotum 2. Further management per urology.  3. Will sign off.   LOS: 5 days    Sherline Eberwein E 06/27/2012, 9:03 AM Pager: [phone]

## 2012-06-27 NOTE — Progress Notes (Signed)
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 Healed well initially and sent home 6/17.  2 - Fever / h/o Bacteruria - Pt with MRSA in urine by hospital UCX 03/2012 and again subsequently in blood. Now on Vancomycin daily per ID x 4-6 week total (nearing end of course). Most recent UCX, BCX negative, New set pending from 6/22. Pt readmitted with low-grade fever 6/22 to 100.7 and malaise, no sig leukocytosis. Now on Vanc + Zosyn emirically. UCX with yeast (now getting diflucan x 3 days), BCX no growth to date. ID now following and agress pelvic fluid sample to verify no infection.  3 - Heme / Recent DVT + PE -incidental PE by CT 03/2012 now on Lovenox, DVT resolved by most recent LE duplex. Repeat CT-PE 6/23 without PE or active bleed from pelvis and Lovenox DC'd. Hgb drift to 7 6/24 and transfused 2 u with response to 8, transfused 2u additional 6/25 with increase to 10. Hematology consult [name]) also agrees no treatment dose anticoagulation at this point.  4 - Pelvic Fluid Collection - Pelvic fluid collection without rim-enhancement noted on CT from ER 6/22. Appears partially simple fluid with some dependent blood by HU analysis, no active extrav of blood or urine by imaging. Plan for IR drain v. Aspiration today to check fluid gram / CX / Cr.  5 - Hemorrhoids / Lower GI Bleed - Long h/o hemorroids. Pt with worsened / friable bleeding hemorroids on admission that are quite bothersome. No prior specific intervention, likely exacerbated by pelvic fluid collection / hematoma. Gen Surg recs conservative measures.  PMH sig for obesity  and left knee replacement, PE + DVT (now resolved). No CV disease.   Today Mohamadou is still c/o hemorrhoids and poor apetite. No fevers. Hgb responding to transfusion.  Objective: Vital signs in last 24 hours: Temp:  [97.9 F (36.6 C)-99.1 F (37.3 C)] 98.1 F (36.7 C) (06/26 0428) Pulse Rate:  [61-100] 82 (06/26 0428) Resp:  [18-20] 18 (06/26 0428) BP: (112-142)/(65-81) 141/81 mmHg (06/26 0428) SpO2:  [97 %-100 %] 97 % (06/26 0428) Last BM Date: 06/25/12  Intake/Output from previous day: 06/25 0701 - 06/26 0700 In: 1547.5 [P.O.:360; I.V.:100; Blood:387.5; IV Piggyback:700] Out: 4475 [Urine:4475] Intake/Output this shift:    General appearance: alert, cooperative, appears stated age and family at bedside Head: Normocephalic, without obvious abnormality, atraumatic Eyes: conjunctivae/corneas clear. PERRL, EOM's intact. Fundi benign. Ears: normal TM's and external ear canals both ears Nose: Nares normal. Septum midline. Mucosa normal. No drainage or sinus tenderness. Throat: lips, mucosa, and tongue normal; teeth and gums normal Neck: no adenopathy, no carotid bruit, no JVD, supple, symmetrical, trachea midline and thyroid not enlarged, symmetric, no tenderness/mass/nodules Back: symmetric, no curvature. ROM normal. No CVA tenderness. Resp: clear to auscultation bilaterally Chest wall: no tenderness Cardio: regular rate and rhythm, S1, S2 normal, no murmur, click, rub or gallop GI: soft, non-tender; bowel sounds normal; no masses,  no organomegaly and hemorroids stable/ slightly improved. No active bleeding. Male genitalia: normal, ecchymoses of perineum stable. Extremities: extremities normal, atraumatic, no cyanosis or edema and RUE PICC c/d/i. Pulses: 2+ and symmetric Skin: Skin color, texture, turgor normal. No rashes or lesions Lymph nodes: Cervical, supraclavicular, and axillary nodes normal. Neurologic: Grossly normal  Incision/Wound: RLQ urostomy pink patetn with bander  stents in place. Left neph tueb capped and c/d/i. Lower midline incision with staples c/d/i.  Lab Results:   Recent Labs  06/26/12 0535 06/27/12 0415  WBC 11.2* 11.7*  HGB 7.9* 9.9*  HCT 24.5* 30.6*  PLT 230 281   BMET  Recent Labs  06/26/12 0535 06/27/12 0415  NA 139 138  K 3.0* 2.9*  CL 106 104  CO2 26 27  GLUCOSE 99 111*  BUN 6 6  CREATININE 0.84 0.90  CALCIUM 8.2* 8.4   PT/INR  Recent Labs  06/26/12 0805  LABPROT 15.3*  INR 1.23   ABG No results found for this basename: PHART, PCO2, PO2, HCO3,  in the last 72 hours  Studies/Results: No results found.  Anti-infectives: Anti-infectives   Start     Dose/Rate Route Frequency Ordered Stop   06/25/12 1000  vancomycin (VANCOCIN) 1,250 mg in sodium chloride 0.9 % 250 mL IVPB     1,250 mg 166.7 mL/hr over 90 Minutes Intravenous Every 12 hours 06/25/12 0856     06/24/12 2000  fluconazole (DIFLUCAN) IVPB 200 mg     200 mg 100 mL/hr over 60 Minutes Intravenous Every 24 hours 06/24/12 1733 06/26/12 2322   06/23/12 0900  vancomycin (VANCOCIN) 1,250 mg in sodium chloride 0.9 % 250 mL IVPB     1,250 mg 166.7 mL/hr over 90 Minutes Intravenous Every 12 hours 06/23/12 0522 06/24/12 2231   06/23/12 0600  piperacillin-tazobactam (ZOSYN) IVPB 3.375 g     3.375 g 12.5 mL/hr over 240 Minutes Intravenous 3 times per day 06/23/12 0447     06/23/12 0330  cefTRIAXone (ROCEPHIN) 1 g in dextrose 5 % 50 mL IVPB     1 g 100 mL/hr over 30 Minutes Intravenous  Once 06/23/12 0319 06/23/12 0440      Assessment/Plan:    1 - Bladder Cancer / Cystectomy / Ureteral Revision- NED form cancer perspective. Will consider restudying ureteral anastamoses while in house with loopogram once CX final and Hgb stable.  2 - Fever / h/o Bacteruria - f/u new CX. Proceed wtih drain / aspirate pelvic fluid now hgb stabilized.  3 - Heme / Recent DVT + PE -Lovenox now stopped. SCD's in place. STRONGLY encouraged OOB to chair and ambulation. Hgb  again stable x 48 hours. Will consider PT eval to help ambulation tomorrow after IR drain.  4 - Pelvic Fluid Collection - DDx included post-op changes v. Urinoma v. dev'p abscess. Will proceed with IR drain today. (cancelled yesterday due to concern of concomitant transfusion)  5 - Hemorrhoids / Lower GI Bleed - AGree with conservative measures for now. Hgb stable.  Replace K PO.  Greatly appreciate hospitalist, gen surgery, ID, hematology input.  [facility], Jarita Raval 06/27/2012

## 2012-06-27 NOTE — Progress Notes (Signed)
Please call back if acute bleeding from hemorrhoids occurs.

## 2012-06-27 NOTE — Progress Notes (Signed)
Please see my consult note from 6/25 2 additional units of blood transfused on 6/25 Hb this AM 9.9 Impression: Pelvic hematoma complication of pelvic surgery/anticoagulants Rec: Follow up non contrast CT Prophylactic dose SQ unfractionated heparin when Hb & hematoma stable

## 2012-06-27 NOTE — Progress Notes (Signed)
Patient ID: Chad Olson, male   DOB: 1945-07-26, 67 y.o.   MRN: [id number]    Subjective: Increased pain with movement.  Objective: Vital signs in last 24 hours: Temp:  [97.9 F (36.6 C)-99.1 F (37.3 C)] 98.1 F (36.7 C) (06/26 0428) Pulse Rate:  [61-100] 82 (06/26 0428) Resp:  [18-20] 18 (06/26 0428) BP: (112-142)/(65-81) 141/81 mmHg (06/26 0428) SpO2:  [97 %-100 %] 97 % (06/26 0428) Last BM Date: 06/25/12  Intake/Output from previous day: 06/25 0701 - 06/26 0700 In: 1547.5 [P.O.:360; I.V.:100; Blood:387.5; IV Piggyback:700] Out: 4475 [Urine:4475] Intake/Output this shift:    PE: Abd: mildly tender in pelvis, no peritonitis Scrotum with severe ecchymosis, buttocks and rectal area same,perineum much softer.  Lab Results:   Recent Labs  06/26/12 0535 06/27/12 0415  WBC 11.2* 11.7*  HGB 7.9* 9.9*  HCT 24.5* 30.6*  PLT 230 281   BMET  Recent Labs  06/26/12 0535 06/27/12 0415  NA 139 138  K 3.0* 2.9*  CL 106 104  CO2 26 27  GLUCOSE 99 111*  BUN 6 6  CREATININE 0.84 0.90  CALCIUM 8.2* 8.4   PT/INR  Recent Labs  06/26/12 0805  LABPROT 15.3*  INR 1.23   CMP     Component Value Date/Time   NA 138 06/27/2012 0415   NA 139 04/18/2012 0801   K 2.9* 06/27/2012 0415   K 3.9 04/18/2012 0801   CL 104 06/27/2012 0415   CL 105 04/18/2012 0801   CO2 27 06/27/2012 0415   CO2 22 04/18/2012 0801   GLUCOSE 111* 06/27/2012 0415   GLUCOSE 121* 04/18/2012 0801   BUN 6 06/27/2012 0415   BUN 9.1 04/18/2012 0801   CREATININE 0.90 06/27/2012 0415   CREATININE 1.1 04/18/2012 0801   CALCIUM 8.4 06/27/2012 0415   CALCIUM 9.3 04/18/2012 0801   PROT 6.7 06/23/2012 0530   PROT 7.3 04/18/2012 0801   ALBUMIN 3.0* 06/23/2012 0530   ALBUMIN 2.7* 04/18/2012 0801   AST 16 06/23/2012 0530   AST 16 04/18/2012 0801   ALT 12 06/23/2012 0530   ALT 22 04/18/2012 0801   ALKPHOS 61 06/23/2012 0530   ALKPHOS 82 04/18/2012 0801   BILITOT 0.3 06/23/2012 0530   BILITOT 0.46 04/18/2012 0801   GFRNONAA  87* 06/27/2012 0415   GFRAA >90 06/27/2012 0415   Lipase     Component Value Date/Time   LIPASE 25 05/13/2012 1650       Studies/Results: No results found.  Anti-infectives: Anti-infectives   Start     Dose/Rate Route Frequency Ordered Stop   06/25/12 1000  vancomycin (VANCOCIN) 1,250 mg in sodium chloride 0.9 % 250 mL IVPB     1,250 mg 166.7 mL/hr over 90 Minutes Intravenous Every 12 hours 06/25/12 0856     06/24/12 2000  fluconazole (DIFLUCAN) IVPB 200 mg     200 mg 100 mL/hr over 60 Minutes Intravenous Every 24 hours 06/24/12 1733 06/26/12 2322   06/23/12 0900  vancomycin (VANCOCIN) 1,250 mg in sodium chloride 0.9 % 250 mL IVPB     1,250 mg 166.7 mL/hr over 90 Minutes Intravenous Every 12 hours 06/23/12 0522 06/24/12 2231   06/23/12 0600  piperacillin-tazobactam (ZOSYN) IVPB 3.375 g     3.375 g 12.5 mL/hr over 240 Minutes Intravenous 3 times per day 06/23/12 0447     06/23/12 0330  cefTRIAXone (ROCEPHIN) 1 g in dextrose 5 % 50 mL IVPB     1 g 100 mL/hr over 30 Minutes  Intravenous  Once 06/23/12 0319 06/23/12 0440       Assessment/Plan 1. Likely massive pelvic hematoma, ? Continued ooze  2. Hemorrhoids secondary to pelvic pressure from #1  3. PEs was on Lovenox, now off  4. S/p cystoprostatectomy and creation of urostomy and revision  Plan: Ice packs and sitz bathes PRN Monitor Hgb We will see later this week   LOS: 5 days    [facility] 06/27/2012

## 2012-06-27 NOTE — Procedures (Signed)
Technically successful CT guided aspiration of pelvic fluid collection only yielding @ 5 cc of bloody fluid despite accessing the fluid collection at 2 separate locations and placement of a temporary 8 Fr drainage catheter.  Aspirated fluid sent to lab for analysis. No immediate post procedural complications.

## 2012-06-28 ENCOUNTER — Inpatient Hospital Stay (HOSPITAL_COMMUNITY): Payer: BC Managed Care – PPO

## 2012-06-28 DIAGNOSIS — I749 Embolism and thrombosis of unspecified artery: Secondary | ICD-10-CM

## 2012-06-28 LAB — BASIC METABOLIC PANEL
BUN: 5 mg/dL — ABNORMAL LOW (ref 6–23)
Calcium: 8.6 mg/dL (ref 8.4–10.5)
GFR calc non Af Amer: >90 mL/min (ref >90)
Glucose, Bld: 125 mg/dL — ABNORMAL HIGH (ref 70–99)

## 2012-06-28 LAB — GLUCOSE, CAPILLARY: Glucose-Capillary: 116 mg/dL — ABNORMAL HIGH (ref 70–99)

## 2012-06-28 LAB — CBC
MCH: 30.1 pg (ref 26.0–34.0)
MCHC: 32.8 g/dL (ref 30.0–36.0)
Platelets: 329 10*3/uL (ref 150–400)
RDW: 15.1 % (ref 11.5–15.5)

## 2012-06-28 MED ORDER — DIATRIZOATE MEGLUMINE 30 % UR SOLN
Freq: Once | URETHRAL | Status: AC | PRN
  Administered 2012-06-28: 300 mL

## 2012-06-28 MED ORDER — HEPARIN SODIUM (PORCINE) 5000 UNIT/ML IJ SOLN
5000.0000 [IU] | Freq: Three times a day (TID) | INTRAMUSCULAR | Status: AC
  Administered 2012-06-28 – 2012-07-01 (×12): 5000 [IU] via SUBCUTANEOUS
  Filled 2012-06-28 (×13): qty 1

## 2012-06-28 MED ORDER — MINERAL OIL RE ENEM
1.0000 | ENEMA | Freq: Once | RECTAL | Status: AC
  Administered 2012-06-28: 1 via RECTAL
  Filled 2012-06-28: qty 1

## 2012-06-28 MED ORDER — OXYCODONE HCL ER 15 MG PO T12A
15.0000 mg | EXTENDED_RELEASE_TABLET | Freq: Two times a day (BID) | ORAL | Status: DC
  Administered 2012-06-28 – 2012-06-30 (×5): 15 mg via ORAL
  Filled 2012-06-28 (×5): qty 1

## 2012-06-28 MED ORDER — POTASSIUM CHLORIDE CRYS ER 20 MEQ PO TBCR
40.0000 meq | EXTENDED_RELEASE_TABLET | Freq: Three times a day (TID) | ORAL | Status: AC
  Administered 2012-06-28 (×3): 40 meq via ORAL
  Filled 2012-06-28 (×3): qty 2

## 2012-06-28 NOTE — Evaluation (Signed)
Physical Therapy Evaluation Patient Details Name: Chad Olson MRN: [id number] DOB: 1945-05-04 Today's Date: 06/28/2012 Time: 4098-1191 PT Time Calculation (min): 19 min  PT Assessment / Plan / Recommendation History of Present Illness     Clinical Impression  Pt is a 66 year old male admitted with Bladder Cancer / Cystectomy / Ureteral Revision as well as pelvic hematoma which is believed to be cause of abdominal pain.  Pt would benefit from acute PT services in order to improve independence with transfers and mobility to prepare for d/c to SNF.  Spouse reports [facility] is best option at this time as she reports difficulty emotionally with caring for him at home.     PT Assessment  Patient needs continued PT services    Follow Up Recommendations  SNF    Does the patient have the potential to tolerate intense rehabilitation      Barriers to Discharge        Equipment Recommendations  None recommended by PT    Recommendations for Other Services     Frequency Min 3X/week    Precautions / Restrictions Precautions Precautions: Fall   Pertinent Vitals/Pain Premedicated, reports increased pain in R lower abdomen during mobility, RN aware      Mobility  Bed Mobility Bed Mobility: Right Sidelying to Sit Right Sidelying to Sit: 3: Mod assist;HOB flat;With rails Details for Bed Mobility Assistance: pt prefers log roll technique, assist for bringing L LE off bed and for trunk upright Transfers Transfers: Sit to Stand;Stand to Sit Sit to Stand: 4: Min assist;With upper extremity assist;From bed Stand to Sit: 4: Min assist;With upper extremity assist;To chair/3-in-1 Details for Transfer Assistance: verbal cues for safe technique Ambulation/Gait Ambulation/Gait Assistance: 4: Min assist Ambulation Distance (Feet): 8 Feet Assistive device: Rolling walker Ambulation/Gait Assistance Details: pt not able to tolerate much distance due to abdominal pain and fatigued quickly so  recliner brought behind pt, also with wide BOS likely due to scrotal edema Gait Pattern: Step-through pattern;Decreased stride length;Wide base of support;Trunk flexed Gait velocity: decreased General Gait Details: verbal cues for safe use of RW    Exercises     PT Diagnosis: Difficulty walking  PT Problem List: Decreased strength;Decreased activity tolerance;Decreased mobility;Pain;Decreased knowledge of use of DME PT Treatment Interventions: DME instruction;Gait training;Functional mobility training;Therapeutic exercise;Therapeutic activities;Patient/family education     PT Goals(Current goals can be found in the care plan section) Acute Rehab PT Goals PT Goal Formulation: With patient Time For Goal Achievement: 07/12/12 Potential to Achieve Goals: Good  Visit Information  Last PT Received On: 07/12/12 Assistance Needed: +2 (for chair following)       Prior Functioning  Home Living Living Arrangements: Spouse/significant other Type of Home: House Home Access: Stairs to enter [employer] of Steps: 3 Entrance Stairs-Rails: Right Home Layout: One level Home Equipment: Walker - 2 wheels;Bedside commode;Cane - single point Prior Function Level of Independence: Independent with assistive device(s) Comments: Spouse reports she assists at home  Communication Communication: No difficulties    Cognition  Cognition Arousal/Alertness: Awake/alert Behavior During Therapy: WFL for tasks assessed/performed Overall Cognitive Status: Within Functional Limits for tasks assessed    Extremity/Trunk Assessment Lower Extremity Assessment Lower Extremity Assessment: Generalized weakness   Balance    End of Session PT - End of Session Activity Tolerance: Patient limited by fatigue;Patient limited by pain Patient left: in chair;with call bell/phone within reach;with family/visitor present;with nursing/sitter in room  GP     Chad Olson,Chad Olson 06/28/2012, 1:08 PM Chad Olson,  PT, DPT 06/28/2012 Pager: [phone]

## 2012-06-28 NOTE — Progress Notes (Signed)
Pt given mineral oil enema per MD order. Pt tolerated enema administration and had no complaints. Pt was not able to retain much of the enema. Pt got up to the bathroom after enema was given, but reported that he only passed gas, no stool. Will continue to monitor pt's BMs and will administer scheduled dulcolax suppository per MAR at 1800.   Arta Bruce [facility]

## 2012-06-28 NOTE — Progress Notes (Signed)
CT 6/26 shows stable size of pelvic fluid collection Attempt at aspiration yielded only 5 ml of bloody fluid Hematoma appears to be organizing He has now been afebrile for over 72 hours Hemoglobin rising without further transfusion: 10.9 Trend in WBC decreased now 10.6 Most of low grade fever and leukocytosis likely due to resolving pelvic hematoma Rec: I feel it is safe to resume prophylactic dose unfractionated heparin 10,000 units SQ BID until he is fully ambulatory.

## 2012-06-28 NOTE — Progress Notes (Signed)
[facility] PROGRESS NOTE  Chad Olson ZOX:[id number] DOB: [birth year] DOA: 06/22/2012 PCP: Chad Grays, MD  Assessment/Plan: Hx of DVT and bilateral PE  -repeated CT scan hematoma stable.   HB stable. Patient was started on prophylaxis dose heparin.   -Recent Doppler of his lower extremity was negative for DVT.  -Repeated a CT angiogram of the chest which was negative for PE.  -Patient received 3 Month treatment for PE. -Agree with stopping Lovenox.  -Appreciate Dr Chad Olson recommendation. No indication for IVC filter.  -SCDs.   Pelvic hematoma: Patient relates worsening abdominal pain. Hematoma stable by CT scan. No orther pathology by CT scan.  Monitor hb on heparin. Abdominal pain probably secondary to hematoma.   Fever with mild leucocytosis  Probably from hematoma. Aspiration negative for bacteria.  Completed treatment with IV vancomycin for MRSA bacteremia. Vancomycin stopped 6-27.  Urine cx growing yeast and started on fluconazole. Patient S/P CT guide aspiration of pelvic hematoma 6-26 Constipation: Will order fleet enema. No evidence of obstruction on CT scan.  Anemia  Received 2 units 6-25. Hb stable.  hemorrhoids  General surgery consulted and recommended sitz bath and ice packs for now. continue anusol rectal creams. Hypokalemia  40 Meq K-CL times 3 ordered. Check Mg level.  Hypertension  Blood pressure stable. Continue metoprolol  Hyperlipidemia  Continue statin.  bladder cancer: S/P surgery.  Treatment per primary team. Loopogram negative for leak.   Will continue to follow patient.   Procedures:  CT guide aspirate of pelvic hematoma.   Antibiotics:  Vancomycin 6-22  Zosyn 6-22  HPI/Subjective: Walk few step with PT.  Still with abdominal pain, lower quadrant. Pain is  but not worse.   Objective: Filed Vitals:   06/27/12 1300 06/27/12 1405 06/27/12 2047 06/28/12 0441  BP: 133/83 146/86 141/80 153/87  Pulse: 78 80 78 80  Temp: 98.5  F (36.9 C) 97.7 F (36.5 C) 98.2 F (36.8 C) 98.2 F (36.8 C)  TempSrc: Oral Oral Oral Oral  Resp: 16 18 18 18   Height:      Weight:      SpO2: 97% 100% 99% 98%    Intake/Output Summary (Last 24 hours) at 06/28/12 1338 Last data filed at 06/28/12 0835  Gross per 24 hour  Intake   1450 ml  Output   3750 ml  Net  -2300 ml   Filed Weights   06/23/12 0405 06/23/12 0449  Weight: 103.2 kg (227 lb 8.2 oz) 100.109 kg (220 lb 11.2 oz)    Exam:   General:  No distress.   Cardiovascular: S 1, S 2 RRR  Respiratory: CTA  Abdomen: BS present, soft, NT  Musculoskeletal: no edema.   Data Reviewed: Basic Metabolic Panel:  Recent Labs Lab 06/25/12 0440 06/25/12 2237 06/26/12 0535 06/27/12 0415 06/28/12 0435  NA 137 140 139 138 138  K 2.9* 3.2* 3.0* 2.9* 3.3*  CL 105 107 106 104 104  CO2 24 26 26 27 27   GLUCOSE 130* 99 99 111* 125*  BUN 8 6 6 6  5*  CREATININE 1.03 0.83 0.84 0.90 0.82  CALCIUM 8.5 8.5 8.2* 8.4 8.6   Liver Function Tests:  Recent Labs Lab 06/22/12 2243 06/23/12 0530  AST 15 16  ALT 11 12  ALKPHOS 65 61  BILITOT 0.3 0.3  PROT 7.2 6.7  ALBUMIN 3.1* 3.0*   No results found for this basename: LIPASE, AMYLASE,  in the last 168 hours No results found for this basename: AMMONIA,  in the  last 168 hours CBC:  Recent Labs Lab 06/22/12 2243  06/24/12 0412 06/25/12 0440 06/25/12 2237 06/26/12 0535 06/27/12 0415 06/28/12 0435  WBC 9.8  < > 13.7* 11.9*  --  11.2* 11.7* 10.6*  NEUTROABS 6.6  --   --   --   --   --   --   --   HGB 11.6*  < > 8.9* 7.0* 8.3* 7.9* 9.9* 10.9*  HCT 35.5*  < > 27.8* 21.6* 25.1* 24.5* 30.6* 33.2*  MCV 90.3  < > 89.4 90.8  --  90.1 90.5 91.7  PLT 349  < > 378 233  --  230 281 329  < > = values in this interval not displayed. Cardiac Enzymes: No results found for this basename: CKTOTAL, CKMB, CKMBINDEX, TROPONINI,  in the last 168 hours BNP (last 3 results) No results found for this basename: PROBNP,  in the last 8760  hours CBG:  Recent Labs Lab 06/27/12 2045 06/28/12 0025 06/28/12 0439 06/28/12 0740 06/28/12 1135  GLUCAP 102* 108* 109* 117* 166*    Recent Results (from the past 240 hour(s))  CULTURE, BLOOD (ROUTINE X 2)     Status: None   Collection Time    06/22/12 10:43 PM      Result Value Range Status   Specimen Description BLOOD LEFT ANTECUBITAL   Final   Special Requests BOTTLES DRAWN AEROBIC AND ANAEROBIC 5CC   Final   Culture  Setup Time 06/23/2012 17:47   Final   Culture     Final   Value:        BLOOD CULTURE RECEIVED NO GROWTH TO DATE CULTURE WILL BE HELD FOR 5 DAYS BEFORE ISSUING A FINAL NEGATIVE REPORT   Report Status PENDING   Incomplete  CULTURE, BLOOD (ROUTINE X 2)     Status: None   Collection Time    06/22/12 10:57 PM      Result Value Range Status   Specimen Description BLOOD LEFT ARM   Final   Special Requests BOTTLES DRAWN AEROBIC AND ANAEROBIC 10CC   Final   Culture  Setup Time 06/23/2012 17:47   Final   Culture     Final   Value:        BLOOD CULTURE RECEIVED NO GROWTH TO DATE CULTURE WILL BE HELD FOR 5 DAYS BEFORE ISSUING A FINAL NEGATIVE REPORT   Report Status PENDING   Incomplete  URINE CULTURE     Status: None   Collection Time    06/23/12  2:17 AM      Result Value Range Status   Specimen Description URINE, CATHETERIZED   Final   Special Requests Normal   Final   Culture  Setup Time 06/23/2012 15:21   Final   Colony Count 35,000 COLONIES/ML   Final   Culture YEAST   Final   Report Status 06/24/2012 FINAL   Final  MRSA PCR SCREENING     Status: None   Collection Time    06/24/12  9:58 AM      Result Value Range Status   MRSA by PCR NEGATIVE  NEGATIVE Final   Comment:            The GeneXpert MRSA Assay (FDA     approved for NASAL specimens     only), is one component of a     comprehensive MRSA colonization     surveillance program. It is not     intended to diagnose MRSA     infection  nor to guide or     monitor treatment for     MRSA  infections.  CULTURE, ROUTINE-ABSCESS     Status: None   Collection Time    06/27/12 12:51 PM      Result Value Range Status   Specimen Description ABSCESS PELVIC   Final   Special Requests NONE   Final   Gram Stain     Final   Value: MODERATE WBC PRESENT, PREDOMINANTLY PMN     NO SQUAMOUS EPITHELIAL CELLS SEEN     NO ORGANISMS SEEN   Culture NO GROWTH 1 DAY   Final   Report Status PENDING   Incomplete     Studies: Ct Abdomen Pelvis Wo Contrast  06/27/2012   *RADIOLOGY REPORT*  Clinical Data: Evaluate pelvic hematoma  CT ABDOMEN AND PELVIS WITHOUT CONTRAST  Technique:  Multidetector CT imaging of the abdomen and pelvis was performed following the standard protocol without intravenous contrast. Note, the examination was performed with the patient positioned prone prior to impending percutaneous pelvic fluid collection aspiration.  Comparison: CTA of the abdomen pelvis - 06/24/2012; abdominal CT - 06/23/2012; 06/02/2012; chest radiograph - 06/23/2012  Findings:  The lack of intravenous contrast limits the ability to evaluate solid abdominal organs.  Note, examination was performed with the patient positioned prone prior to impending percutaneous pelvic fluid collection aspiration.  The previously described pelvic hematoma is grossly unchanged to minimally decreased in size, measuring approximately 20 x 8.5 cm in greatest oblique sagittal dimension (sagittal image 75, series 603), previously, 22 x 9.4 cm.  There is increased attenuation of the blood products within the hematoma suggestive of interval clot formation.  There is persistent mass effect upon the adjacent rectum.  There is no definitive stranding about the known pelvic fluid collection.  There is extensive stranding noted about the imaged cranial aspect of the scrotum.  There is a minimal amount of free fluid lying dependently within the left anterior upper abdominal quadrant (image 24) and caudal to the right lobe of the liver (image 33 -  note again the patient was imaged lying prone). No new organized fluid collection on this noncontrast examination.  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  No urinary obstruction. Grossly symmetric likely age related perinephric stranding.  The previously characterized right-sided renal cysts appear grossly unchanged on this noncontrast examination.  No definite renal stones.  Normal noncontrast appearance of the bilateral adrenal glands, pancreas and spleen.  Normal noncontrast appearance of the liver.  The gallbladder is distended.  There is potentially minimal amount of pericholecystic fluid on this noncontrast examination.  Previously the ingested enteric contrast extends to the level of the distal sigmoid colon.  No definite evidence of enteric obstruction.  No pneumoperitoneum, pneumatosis or portal venous gas.  There is scattered minimal amount of atherosclerotic plaque within a mildly tortuous but normal caliber abdominal aorta.  No definite retroperitoneal, mesenteric, pelvic or inguinal lymphadenopathy on this noncontrast examination.  Limited visualization of the lower thorax demonstrates minimal dependent subsegmental atelectasis. Borderline cardiomegaly. No pericardial effusion.  Known right upper extremity approach PICC line tip terminates at the superior cavoatrial junction.  No acute or aggressive osseous abnormalities.  Mild to moderate multilevel thoracolumbar spine degenerative change.  IMPRESSION: 1.  No change to minimal decrease in size of known pelvic hematoma. There is increased attenuation of the blood products within the hematoma compatible with interval coagulation. No new organized intra-abdominal  fluid collections. 2.  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  No evidence  of urinary obstruction.  3.  Interval passage of enteric contrast, now seen within the distal sigmoid colon.  No definite evidence of enteric obstruction.   Original Report Authenticated By: Tacey Ruiz, MD   Dg Loopogram  06/28/2012   *RADIOLOGY REPORT*  Clinical Data: 67 year old male with history of bladder carcinoma status post cystoprostatectomy with ileal conduit.  The ileal conduit was revised recently. Evaluate for new ureteral anastomosis leak.  Also postoperative pelvic hematoma status post recent percutaneous drainage attempt.  LOOPOGRAM  Comparison: CT abdomen and pelvis 06/27/2012 and earlier. Loopogram 05/15/2012.  Fluoroscopy time 2-minute-19-second.  Contrast: Approximately 80 ml cystografin.  Findings: Preprocedural scout KUB view of the abdomen demonstrate bilateral double J ureteral stent, the stent extend out through the ileal conduit and are looped in the ileostomy bag.  There is also partially visualized a left nephrostomy catheter.  Midline skin staples in place.  A Foley catheter was carefully inserted into the ileostomy/proximal ileal conduit and the balloon was inflated with a small volume of contrast.  Under fluoroscopic observation, cystogram was slowly injected.  No resistance to injection was encountered.  The distal ileal conduit filled readily.  As the distal ileal conduit was distended, and before any ureteral retrograde contrast opacification occurred, contrast began to leak from the skin site.  This was cleared, and additional contrast was injected with a gentle pressure at the ileostomy site.  During the spinal injection contrast readily refluxed into the left ureter along the left double-J stent and to the left renal pelvis and collecting system (see post KUB).  No retrograde no contrast opacification of the right ureter occurred. No anastomosis leak or extravasation identified.  IMPRESSION: 1.  Opacification of the distal ileal conduit without evidence of leak. 2.   Opacification of the left ureter, and left renal collecting system. 3.  No opacification of the right ureter or renal collecting system.   Original Report Authenticated By: Erskine Speed, M.D.   Ct Aspiration  06/27/2012   *RADIOLOGY REPORT*  Indication: History of right lower quadrant ileostomy creation, now with symptomatic, potentially infected pelvic hematoma  CT GUIDED ASPIRATION OF PELVIC HEMATOMA VIA LEFT TRANSGLUTEAL APPROACH  Comparison: CT of the abdomen pelvis - earlier same day; 06/24/2012; 06/23/2012; 06/02/2012  Medications: Fentanyl 200 mcg IV; Versed 2 mg IV  Contrast: None  Sedation time: 20 minutes  Complications: None immediate  TECHNIQUE/FINDINGS:  Informed consent was obtained from the patient following an explanation of the procedure, risks, benefits and alternatives.  A time out was performed prior to the initiation of the procedure.  The patient remained prone  on the CT table (this CT guided aspiration was immediately preceded by acquisition of a noncontrast CT of the abdomen pelvis) and a limited CT was performed for procedural planning demonstrating grossly unchanged appearance of the known pelvic hematoma.  The procedure was planned.  The operative site was prepped and draped in the usual sterile fashion. Utilizing a left-sided transgluteal approach, appropriate trajectory was confirmed with a 22 gauge spinal needle after the adjacent tissues were anesthetized with 1% Lidocaine with epinephrine.  Under intermittent CT guidance, a 5-French multi side-hole Yueh sheath needle was advanced into the peripheral aspect of the pelvic fluid hematoma.  No fluid was able to be aspirated from the collection.  An Amplatz wire was coiled within the fluid collection allowing  for placement of an 8-French [facility] all-purpose drainage catheter.  Appropriate positioning was confirmed however only approximately 5 ml of bloody fluid was able to be aspirated from the pelvic fluid collection. The sample was  capped and sent to the laboratory for analysis.  The drainage catheter was removed.  Under intermittent CT guidance, the Yueh sheath needle was advanced into the more central aspect of the pelvic fluid collection however again, no fluid was able to be aspirated from the collection.  At this point, the procedure was terminated.  The Yueh sheath needle was removed and hemostasis was achieved with manual compression.  A dressing was placed.  The patient tolerated the procedure well without immediate postprocedural complication.  IMPRESSION:  Technically successful CT guided aspiration of only approximately 5 ml of blood tinged fluid despite the placement of a temporary 8- [place] all purpose drainage catheter.  The aspirated fluid was capped and sent to the laboratory for analysis.   Original Report Authenticated By: Tacey Ruiz, MD    Scheduled Meds: . acidophilus  1 capsule Oral Daily  . bisacodyl  10 mg Rectal BID  . feeding supplement  237 mL Oral BID BM  . feeding supplement  1 Container Oral BID BM  . heparin subcutaneous  5,000 Units Subcutaneous Q8H  . hydrocortisone   Rectal TID  . insulin aspart  0-15 Units Subcutaneous Q4H  . metoprolol tartrate  12.5 mg Oral BID  . mineral oil  1 enema Rectal Once  . multivitamin with minerals  1 tablet Oral Daily  . OxyCODONE  15 mg Oral Q12H  . pantoprazole  40 mg Oral Daily  . polyethylene glycol  17 g Oral Daily  . potassium chloride  40 mEq Oral TID  . senna-docusate  1 tablet Oral BID  . simvastatin  10 mg Oral q1800   Continuous Infusions:    Active Problems:   Bladder cancer   Acute pulmonary embolism (3/14)   DVT (deep venous thrombosis) 3/14   Fever   Bacteremia MRSA 5/14   VTE (venous thromboembolism)   Protein-calorie malnutrition, severe   Acute blood loss anemia   Pelvic hematoma, male    Time spent: 25 minutes.     Rawn Quiroa  [facility] Pager [phone]. If 7PM-7AM, please contact night-coverage at  [email or web address], password [facility] 06/28/2012, 1:38 PM  LOS: 6 days

## 2012-06-28 NOTE — Progress Notes (Signed)
ANTIBIOTIC CONSULT NOTE - Follow Up  Pharmacy Consult for Vancomycin Indication: MRSA Bacteremia  No Known Allergies  Patient Measurements: Height: 5\' 11"  (180.3 cm) Weight: 220 lb 11.2 oz (100.109 kg) IBW/kg (Calculated) : 75.3  Vital Signs: Temp: 98.2 F (36.8 C) (06/27 0441) Temp src: Oral (06/27 0441) BP: 153/87 mmHg (06/27 0441) Pulse Rate: 80 (06/27 0441)  Labs:  Recent Labs  06/26/12 0535 06/27/12 0415 06/28/12 0435  WBC 11.2* 11.7* 10.6*  HGB 7.9* 9.9* 10.9*  PLT 230 281 329  CREATININE 0.84 0.90 0.82   Estimated Creatinine Clearance: 106.8 ml/min (by C-G formula based on Cr of 0.82). No results found for this basename: VANCOTROUGH, Leodis Binet, VANCORANDOM, GENTTROUGH, GENTPEAK, GENTRANDOM, TOBRATROUGH, TOBRAPEAK, TOBRARND, AMIKACINPEAK, AMIKACINTROU, AMIKACIN,  in the last 72 hours   Microbiology: 03/25/12 blood = MRSA 05/13/12 blood = MRSA 6/22 blood: NGTD 6/22 urine: 35K yeast 6/23 mrsa pcr: (-) 6/26 pelvic fluid aspiration: no growth 1 day  Assessment: 67 yr old male with h/o bladder cancer.  S/P surgery 6/13.  Presented with complaint of fever, abdominal pain, nausea/vomiting. On Vancomycin 1500mg  IV q24h prior to admission for MRSA bacteremia (confirmed dose with [facility]) for 6 week treatment scheduled to complete 6/24.  IV vancomycin continued for MRSA bacteremia and suspected post-op infection (D#45 Vancomycin 1250 mg IV q12h).  IV Zosyn per MD dosing also ordered (D#6 Zosyn 3.375g IV q8h, extended infusion).  Continuing IV antibiotics pending pelvic fluid aspiration culture results. Sample obtained 6/26, no growth 1 day.  Vancomycin trough level therapeutic with last admission on 6/16 with a regimen of Vancomycin 1250mg  IV q12h.  This dosing was continued and repeated VT 6/24 remained therapeutic.  Renal function remains stable.  No adjustments to abx.  Goal of Therapy:  Vancomycin trough level 15-20 mcg/ml  Plan:  1) Continue Vanco 1250mg  IV  q12h.   2) Follow up culture results and planned length of therapy.   Clance Boll, PharmD, BCPS Pager: [phone] 06/28/2012 10:45 AM

## 2012-06-28 NOTE — Progress Notes (Addendum)
[facility]  Date of Admission:  06/22/2012  Antibiotics: Day 45 vancomcyin Day 6 Zosyn  Subjective: Pain in back, getting up with PT  Objective: Temp:  [97.7 F (36.5 C)-98.5 F (36.9 C)] 98.2 F (36.8 C) (06/27 0441) Pulse Rate:  [75-94] 80 (06/27 0441) Resp:  [10-18] 18 (06/27 0441) BP: (119-153)/(80-89) 153/87 mmHg (06/27 0441) SpO2:  [97 %-100 %] 98 % (06/27 0441)  General: Awake, alert, nad Skin: no rashes RRR CTA B Soft, nt, nd   Lab Results Lab Results  Component Value Date   WBC 10.6* 06/28/2012   HGB 10.9* 06/28/2012   HCT 33.2* 06/28/2012   MCV 91.7 06/28/2012   PLT 329 06/28/2012    Lab Results  Component Value Date   CREATININE 0.82 06/28/2012   BUN 5* 06/28/2012   NA 138 06/28/2012   K 3.3* 06/28/2012   CL 104 06/28/2012   CO2 27 06/28/2012    Lab Results  Component Value Date   ALT 12 06/23/2012   AST 16 06/23/2012   ALKPHOS 61 06/23/2012   BILITOT 0.3 06/23/2012      Microbiology: Recent Results (from the past 240 hour(s))  CULTURE, BLOOD (ROUTINE X 2)     Status: None   Collection Time    06/22/12 10:43 PM      Result Value Range Status   Specimen Description BLOOD LEFT ANTECUBITAL   Final   Special Requests BOTTLES DRAWN AEROBIC AND ANAEROBIC 5CC   Final   Culture  Setup Time 06/23/2012 17:47   Final   Culture     Final   Value:        BLOOD CULTURE RECEIVED NO GROWTH TO DATE CULTURE WILL BE HELD FOR 5 DAYS BEFORE ISSUING A FINAL NEGATIVE REPORT   Report Status PENDING   Incomplete  CULTURE, BLOOD (ROUTINE X 2)     Status: None   Collection Time    06/22/12 10:57 PM      Result Value Range Status   Specimen Description BLOOD LEFT ARM   Final   Special Requests BOTTLES DRAWN AEROBIC AND ANAEROBIC 10CC   Final   Culture  Setup Time 06/23/2012 17:47   Final   Culture     Final   Value:        BLOOD CULTURE RECEIVED NO GROWTH TO DATE CULTURE WILL BE HELD FOR 5 DAYS BEFORE ISSUING A FINAL NEGATIVE REPORT   Report  Status PENDING   Incomplete  URINE CULTURE     Status: None   Collection Time    06/23/12  2:17 AM      Result Value Range Status   Specimen Description URINE, CATHETERIZED   Final   Special Requests Normal   Final   Culture  Setup Time 06/23/2012 15:21   Final   Colony Count 35,000 COLONIES/ML   Final   Culture YEAST   Final   Report Status 06/24/2012 FINAL   Final  MRSA PCR SCREENING     Status: None   Collection Time    06/24/12  9:58 AM      Result Value Range Status   MRSA by PCR NEGATIVE  NEGATIVE Final   Comment:            The GeneXpert MRSA Assay (FDA     approved for NASAL specimens     only), is one component of a     comprehensive MRSA colonization     surveillance program. It  is not     intended to diagnose MRSA     infection nor to guide or     monitor treatment for     MRSA infections.  CULTURE, ROUTINE-ABSCESS     Status: None   Collection Time    06/27/12 12:51 PM      Result Value Range Status   Specimen Description ABSCESS PELVIC   Final   Special Requests NONE   Final   Gram Stain     Final   Value: MODERATE WBC PRESENT, PREDOMINANTLY PMN     NO SQUAMOUS EPITHELIAL CELLS SEEN     NO ORGANISMS SEEN   Culture NO GROWTH 1 DAY   Final   Report Status PENDING   Incomplete    Studies/Results: Ct Abdomen Pelvis Wo Contrast  06/27/2012   *RADIOLOGY REPORT*  Clinical Data: Evaluate pelvic hematoma  CT ABDOMEN AND PELVIS WITHOUT CONTRAST  Technique:  Multidetector CT imaging of the abdomen and pelvis was performed following the standard protocol without intravenous contrast. Note, the examination was performed with the patient positioned prone prior to impending percutaneous pelvic fluid collection aspiration.  Comparison: CTA of the abdomen pelvis - 06/24/2012; abdominal CT - 06/23/2012; 06/02/2012; chest radiograph - 06/23/2012  Findings:  The lack of intravenous contrast limits the ability to evaluate solid abdominal organs.  Note, examination was performed with  the patient positioned prone prior to impending percutaneous pelvic fluid collection aspiration.  The previously described pelvic hematoma is grossly unchanged to minimally decreased in size, measuring approximately 20 x 8.5 cm in greatest oblique sagittal dimension (sagittal image 75, series 603), previously, 22 x 9.4 cm.  There is increased attenuation of the blood products within the hematoma suggestive of interval clot formation.  There is persistent mass effect upon the adjacent rectum.  There is no definitive stranding about the known pelvic fluid collection.  There is extensive stranding noted about the imaged cranial aspect of the scrotum.  There is a minimal amount of free fluid lying dependently within the left anterior upper abdominal quadrant (image 24) and caudal to the right lobe of the liver (image 33 - note again the patient was imaged lying prone). No new organized fluid collection on this noncontrast examination.  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  No urinary obstruction. Grossly symmetric likely age related perinephric stranding.  The previously characterized right-sided renal cysts appear grossly unchanged on this noncontrast examination.  No definite renal stones.  Normal noncontrast appearance of the bilateral adrenal glands, pancreas and spleen.  Normal noncontrast appearance of the liver.  The gallbladder is distended.  There is potentially minimal amount of pericholecystic fluid on this noncontrast examination.  Previously the ingested enteric contrast extends to the level of the distal sigmoid colon.  No definite evidence of enteric obstruction.  No pneumoperitoneum, pneumatosis or portal venous gas.  There is scattered minimal amount of atherosclerotic plaque within a mildly tortuous but normal caliber abdominal aorta.  No definite retroperitoneal, mesenteric, pelvic or  inguinal lymphadenopathy on this noncontrast examination.  Limited visualization of the lower thorax demonstrates minimal dependent subsegmental atelectasis. Borderline cardiomegaly. No pericardial effusion.  Known right upper extremity approach PICC line tip terminates at the superior cavoatrial junction.  No acute or aggressive osseous abnormalities.  Mild to moderate multilevel thoracolumbar spine degenerative change.  IMPRESSION: 1.  No change to minimal decrease in size of known pelvic hematoma. There is increased attenuation of  the blood products within the hematoma compatible with interval coagulation. No new organized intra-abdominal fluid collections. 2.  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  No evidence of urinary obstruction.  3.  Interval passage of enteric contrast, now seen within the distal sigmoid colon.  No definite evidence of enteric obstruction.   Original Report Authenticated By: Tacey Ruiz, MD   Dg Loopogram  06/28/2012   *RADIOLOGY REPORT*  Clinical Data: 67 year old male with history of bladder carcinoma status post cystoprostatectomy with ileal conduit.  The ileal conduit was revised recently. Evaluate for new ureteral anastomosis leak.  Also postoperative pelvic hematoma status post recent percutaneous drainage attempt.  LOOPOGRAM  Comparison: CT abdomen and pelvis 06/27/2012 and earlier. Loopogram 05/15/2012.  Fluoroscopy time 2-minute-19-second.  Contrast: Approximately 80 ml cystografin.  Findings: Preprocedural scout KUB view of the abdomen demonstrate bilateral double J ureteral stent, the stent extend out through the ileal conduit and are looped in the ileostomy bag.  There is also partially visualized a left nephrostomy catheter.  Midline skin staples in place.  A Foley catheter was carefully inserted into the ileostomy/proximal ileal conduit and the balloon was inflated  with a small volume of contrast.  Under fluoroscopic observation, cystogram was slowly injected.  No resistance to injection was encountered.  The distal ileal conduit filled readily.  As the distal ileal conduit was distended, and before any ureteral retrograde contrast opacification occurred, contrast began to leak from the skin site.  This was cleared, and additional contrast was injected with a gentle pressure at the ileostomy site.  During the spinal injection contrast readily refluxed into the left ureter along the left double-J stent and to the left renal pelvis and collecting system (see post KUB).  No retrograde no contrast opacification of the right ureter occurred. No anastomosis leak or extravasation identified.  IMPRESSION: 1.  Opacification of the distal ileal conduit without evidence of leak. 2.  Opacification of the left ureter, and left renal collecting system. 3.  No opacification of the right ureter or renal collecting system.   Original Report Authenticated By: Erskine Speed, M.D.   Ct Aspiration  06/27/2012   *RADIOLOGY REPORT*  Indication: History of right lower quadrant ileostomy creation, now with symptomatic, potentially infected pelvic hematoma  CT GUIDED ASPIRATION OF PELVIC HEMATOMA VIA LEFT TRANSGLUTEAL APPROACH  Comparison: CT of the abdomen pelvis - earlier same day; 06/24/2012; 06/23/2012; 06/02/2012  Medications: Fentanyl 200 mcg IV; Versed 2 mg IV  Contrast: None  Sedation time: 20 minutes  Complications: None immediate  TECHNIQUE/FINDINGS:  Informed consent was obtained from the patient following an explanation of the procedure, risks, benefits and alternatives.  A time out was performed prior to the initiation of the procedure.  The patient remained prone  on the CT table (this CT guided aspiration was immediately preceded by acquisition of a noncontrast CT of the abdomen pelvis) and a limited CT was performed for procedural planning demonstrating grossly unchanged appearance of  the known pelvic hematoma.  The procedure was planned.  The operative site was prepped and draped in the usual sterile fashion. Utilizing a left-sided transgluteal approach, appropriate trajectory was confirmed with a 22 gauge spinal needle after the adjacent tissues were anesthetized with 1% Lidocaine with epinephrine.  Under intermittent CT guidance, a 5-French multi side-hole Yueh sheath needle was advanced into the peripheral aspect of the pelvic fluid hematoma.  No fluid was able to be aspirated  from the collection.  An Amplatz wire was coiled within the fluid collection allowing for placement of an 8-French [facility] all-purpose drainage catheter.  Appropriate positioning was confirmed however only approximately 5 ml of bloody fluid was able to be aspirated from the pelvic fluid collection. The sample was capped and sent to the laboratory for analysis.  The drainage catheter was removed.  Under intermittent CT guidance, the Yueh sheath needle was advanced into the more central aspect of the pelvic fluid collection however again, no fluid was able to be aspirated from the collection.  At this point, the procedure was terminated.  The Yueh sheath needle was removed and hemostasis was achieved with manual compression.  A dressing was placed.  The patient tolerated the procedure well without immediate postprocedural complication.  IMPRESSION:  Technically successful CT guided aspiration of only approximately 5 ml of blood tinged fluid despite the placement of a temporary 8- [place] all purpose drainage catheter.  The aspirated fluid was capped and sent to the laboratory for analysis.   Original Report Authenticated By: Tacey Ruiz, MD    Assessment/Plan: 1) Hematoma - drainage c/w hematoma, not infection.  There are WBCs in gram stain which is not unexpected with RBCs.  I will d/c zosyn, no indication for further antibiotics.    2) MRSA bacteremia - blood cultures have remained negative.  No indication to extend  treatment as fever due to hematoma and has resolved.  Will d/c vancomycin.    I will sign off, please call with any questions.  Thanks   Staci Righter, MD [facility] [email or web address] [phone] pager   [phone] cell 06/28/2012, 10:56 AM

## 2012-06-28 NOTE — Progress Notes (Signed)
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 Healed well initially and sent home 6/17.  2 - Fever / h/o Bacteruria - Pt with MRSA in urine by hospital UCX 03/2012 and again subsequently in blood. Now on Vancomycin daily per ID x 4-6 week total (nearing end of course). Most recent UCX, BCX negative, New set pending from 6/22. Pt readmitted with low-grade fever 6/22 to 100.7 and malaise, no sig leukocytosis. Now on Vanc + Zosyn emirically. UCX with yeast (now getting diflucan x 3 days), BCX no growth to date. ID now following and agress pelvic fluid sample (collected 6/26) to verify no growth.  3 - Heme / Recent DVT + PE -incidental PE by CT 03/2012 now on Lovenox, DVT resolved by most recent LE duplex. Repeat CT-PE 6/23 without PE or active bleed from pelvis and Lovenox DC'd. Hgb drift to 7 6/24 and transfused 2 u with response to 8, transfused 2u additional 6/25 with increase to 10. Hematology consult [name]) also agrees no treatment dose anticoagulation at this point, but resume proph dosing since he is not ambulating much.  4 - Pelvic Fluid Collection - Pelvic fluid collection without rim-enhancement noted on CT from ER 6/22. Appears partially simple fluid with some dependent blood by HU analysis, no active extrav of blood or urine by imaging. IR aspiration performed 6/26 and fluid c/w hematoma only (not purulent, not simple/drainable).   5 - Hemorrhoids / Lower GI Bleed - Long h/o hemorroids. Pt with worsened / friable bleeding hemorroids on admission that are quite bothersome. No prior specific intervention, likely exacerbated by  pelvic fluid collection / hematoma. Gen Surg recs conservative measures.  PMH sig for obesity and left knee replacement, PE + DVT (now resolved). No CV disease.   Today Tallie is still c/o hemorrhoids and poor apetite. No fevers. Hgb now stable and increasing. He remains minimally ambulatory.  Objective: Vital signs in last 24 hours: Temp:  [97.7 F (36.5 C)-98.5 F (36.9 C)] 98.2 F (36.8 C) (06/27 0441) Pulse Rate:  [75-94] 80 (06/27 0441) Resp:  [10-18] 18 (06/27 0441) BP: (119-153)/(80-89) 153/87 mmHg (06/27 0441) SpO2:  [97 %-100 %] 98 % (06/27 0441) Last BM Date: 06/25/12  Intake/Output from previous day: 06/26 0701 - 06/27 0700 In: 1750 [I.V.:1100; IV Piggyback:650] Out: 2950 [Urine:2950] Intake/Output this shift:    General appearance: alert, cooperative, appears stated age and family at bedside Head: Normocephalic, without obvious abnormality, atraumatic Eyes: conjunctivae/corneas clear. PERRL, EOM's intact. Fundi benign. Ears: normal TM's and external ear canals both ears Nose: Nares normal. Septum midline. Mucosa normal. No drainage or sinus tenderness. Throat: lips, mucosa, and tongue normal; teeth and gums normal Neck: no adenopathy, no carotid bruit, no JVD, supple, symmetrical, trachea midline and thyroid not enlarged, symmetric, no tenderness/mass/nodules Back: symmetric, no curvature. ROM normal. No CVA tenderness. Resp: clear to auscultation bilaterally Chest wall: no tenderness Cardio: regular rate and rhythm, S1, S2 normal, no murmur, click, rub or gallop GI: soft, non-tender; bowel sounds normal; no masses,  no organomegaly Male genitalia: normal, stable perineal ecchymoses Pelvic: stable perineal ecchymoses Extremities: extremities normal, atraumatic, no cyanosis or edema and RUE PICC c/d/i. Pulses: 2+ and symmetric Skin: Skin color, texture, turgor normal. No rashes or lesions Lymph nodes:  Cervical, supraclavicular, and axillary nodes  normal. Neurologic: Grossly normal Incision/Wound: REcent lower midline incision area c/d/i. No drainage. RLQ urostomy pink + patent with bander stents in place and clear yellow urine in bag. Buttocks IR aspiration site c/d/i w/o hematoma.  Lab Results:   Recent Labs  06/27/12 0415 06/28/12 0435  WBC 11.7* 10.6*  HGB 9.9* 10.9*  HCT 30.6* 33.2*  PLT 281 329   BMET  Recent Labs  06/27/12 0415 06/28/12 0435  NA 138 138  K 2.9* 3.3*  CL 104 104  CO2 27 27  GLUCOSE 111* 125*  BUN 6 5*  CREATININE 0.90 0.82  CALCIUM 8.4 8.6   PT/INR  Recent Labs  06/26/12 0805  LABPROT 15.3*  INR 1.23   ABG No results found for this basename: PHART, PCO2, PO2, HCO3,  in the last 72 hours  Studies/Results: Ct Abdomen Pelvis Wo Contrast  06/27/2012   *RADIOLOGY REPORT*  Clinical Data: Evaluate pelvic hematoma  CT ABDOMEN AND PELVIS WITHOUT CONTRAST  Technique:  Multidetector CT imaging of the abdomen and pelvis was performed following the standard protocol without intravenous contrast. Note, the examination was performed with the patient positioned prone prior to impending percutaneous pelvic fluid collection aspiration.  Comparison: CTA of the abdomen pelvis - 06/24/2012; abdominal CT - 06/23/2012; 06/02/2012; chest radiograph - 06/23/2012  Findings:  The lack of intravenous contrast limits the ability to evaluate solid abdominal organs.  Note, examination was performed with the patient positioned prone prior to impending percutaneous pelvic fluid collection aspiration.  The previously described pelvic hematoma is grossly unchanged to minimally decreased in size, measuring approximately 20 x 8.5 cm in greatest oblique sagittal dimension (sagittal image 75, series 603), previously, 22 x 9.4 cm.  There is increased attenuation of the blood products within the hematoma suggestive of interval clot formation.  There is persistent mass effect upon the adjacent rectum.  There is no definitive stranding  about the known pelvic fluid collection.  There is extensive stranding noted about the imaged cranial aspect of the scrotum.  There is a minimal amount of free fluid lying dependently within the left anterior upper abdominal quadrant (image 24) and caudal to the right lobe of the liver (image 33 - note again the patient was imaged lying prone). No new organized fluid collection on this noncontrast examination.  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  No urinary obstruction. Grossly symmetric likely age related perinephric stranding.  The previously characterized right-sided renal cysts appear grossly unchanged on this noncontrast examination.  No definite renal stones.  Normal noncontrast appearance of the bilateral adrenal glands, pancreas and spleen.  Normal noncontrast appearance of the liver.  The gallbladder is distended.  There is potentially minimal amount of pericholecystic fluid on this noncontrast examination.  Previously the ingested enteric contrast extends to the level of the distal sigmoid colon.  No definite evidence of enteric obstruction.  No pneumoperitoneum, pneumatosis or portal venous gas.  There is scattered minimal amount of atherosclerotic plaque within a mildly tortuous but normal caliber abdominal aorta.  No definite retroperitoneal, mesenteric, pelvic or inguinal lymphadenopathy on this noncontrast examination.  Limited visualization of the lower thorax demonstrates minimal dependent subsegmental atelectasis. Borderline cardiomegaly. No pericardial effusion.  Known right upper extremity approach PICC line tip terminates at the superior cavoatrial junction.  No acute or aggressive osseous abnormalities.  Mild to moderate multilevel thoracolumbar spine degenerative change.  IMPRESSION: 1.  No change to minimal decrease in size of known pelvic hematoma. There is increased  attenuation of the blood products within the hematoma compatible with interval coagulation. No new organized intra-abdominal fluid collections. 2.  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  No evidence of urinary obstruction.  3.  Interval passage of enteric contrast, now seen within the distal sigmoid colon.  No definite evidence of enteric obstruction.   Original Report Authenticated By: Tacey Ruiz, MD   Ct Aspiration  06/27/2012   *RADIOLOGY REPORT*  Indication: History of right lower quadrant ileostomy creation, now with symptomatic, potentially infected pelvic hematoma  CT GUIDED ASPIRATION OF PELVIC HEMATOMA VIA LEFT TRANSGLUTEAL APPROACH  Comparison: CT of the abdomen pelvis - earlier same day; 06/24/2012; 06/23/2012; 06/02/2012  Medications: Fentanyl 200 mcg IV; Versed 2 mg IV  Contrast: None  Sedation time: 20 minutes  Complications: None immediate  TECHNIQUE/FINDINGS:  Informed consent was obtained from the patient following an explanation of the procedure, risks, benefits and alternatives.  A time out was performed prior to the initiation of the procedure.  The patient remained prone  on the CT table (this CT guided aspiration was immediately preceded by acquisition of a noncontrast CT of the abdomen pelvis) and a limited CT was performed for procedural planning demonstrating grossly unchanged appearance of the known pelvic hematoma.  The procedure was planned.  The operative site was prepped and draped in the usual sterile fashion. Utilizing a left-sided transgluteal approach, appropriate trajectory was confirmed with a 22 gauge spinal needle after the adjacent tissues were anesthetized with 1% Lidocaine with epinephrine.  Under intermittent CT guidance, a 5-French multi side-hole Yueh sheath needle was advanced into the peripheral aspect of the pelvic fluid hematoma.  No fluid was able to be  aspirated from the collection.  An Amplatz wire was coiled within the fluid collection allowing for placement of an 8-French [facility] all-purpose drainage catheter.  Appropriate positioning was confirmed however only approximately 5 ml of bloody fluid was able to be aspirated from the pelvic fluid collection. The sample was capped and sent to the laboratory for analysis.  The drainage catheter was removed.  Under intermittent CT guidance, the Yueh sheath needle was advanced into the more central aspect of the pelvic fluid collection however again, no fluid was able to be aspirated from the collection.  At this point, the procedure was terminated.  The Yueh sheath needle was removed and hemostasis was achieved with manual compression.  A dressing was placed.  The patient tolerated the procedure well without immediate postprocedural complication.  IMPRESSION:  Technically successful CT guided aspiration of only approximately 5 ml of blood tinged fluid despite the placement of a temporary 8- [place] all purpose drainage catheter.  The aspirated fluid was capped and sent to the laboratory for analysis.   Original Report Authenticated By: Tacey Ruiz, MD    Anti-infectives: Anti-infectives   Start     Dose/Rate Route Frequency Ordered Stop   06/25/12 1000  vancomycin (VANCOCIN) 1,250 mg in sodium chloride 0.9 % 250 mL IVPB     1,250 mg 166.7 mL/hr over 90 Minutes Intravenous Every 12 hours 06/25/12 0856     06/24/12 2000  fluconazole (DIFLUCAN) IVPB 200 mg     200 mg 100 mL/hr over 60 Minutes Intravenous Every 24 hours 06/24/12 1733 06/26/12 2322   06/23/12 0900  vancomycin (VANCOCIN) 1,250 mg in sodium chloride 0.9 % 250  mL IVPB     1,250 mg 166.7 mL/hr over 90 Minutes Intravenous Every 12 hours 06/23/12 0522 06/24/12 2231   06/23/12 0600  piperacillin-tazobactam (ZOSYN) IVPB 3.375 g     3.375 g 12.5 mL/hr over 240 Minutes Intravenous 3 times per day 06/23/12 0447     06/23/12 0330  cefTRIAXone (ROCEPHIN)  1 g in dextrose 5 % 50 mL IVPB     1 g 100 mL/hr over 30 Minutes Intravenous  Once 06/23/12 0319 06/23/12 0440      Assessment/Plan:  1 - Bladder Cancer / Cystectomy / Ureteral Revision- NED form cancer perspective. RX'd loopogram to verify no extrav before planned stent removal (in several weeks, but will perform study while in house to decrease burden to pt). Will begin oxy IR in effort to provide suitable pain control to enable better ambulation.   2 - Fever / h/o Bacteruria - f/u new CX from pelvic aspirate.  3 - Heme / Recent DVT + PE -Lovenox now stopped. SCD's in place. STRONGLY encouraged OOB to chair and ambulation. Hgb again stable x 48 hours. PT eval today for return to ambulation and post-op placement recs. Restart proph SQ heparin.  4 - Pelvic Fluid Collection - Imaging and aspirate c/w non-infected hemaotma. Size stable / decreasing as expected. Eplained to pt this will resolve, but slowly (weeks-mos).  5 - Hemorrhoids / Lower GI Bleed - AGree with conservative measures for now. Hgb stable.  Replace K PO. Saline lock IV. Plan for DC early next week pending PT placement recs and suitable pain control.  Greatly appreciate hospitalist, gen surgery, ID, hematology input.  [facility], Baird Polinski 06/28/2012

## 2012-06-29 LAB — GLUCOSE, CAPILLARY
Glucose-Capillary: 108 mg/dL — ABNORMAL HIGH (ref 70–99)
Glucose-Capillary: 110 mg/dL — ABNORMAL HIGH (ref 70–99)
Glucose-Capillary: 111 mg/dL — ABNORMAL HIGH (ref 70–99)
Glucose-Capillary: 112 mg/dL — ABNORMAL HIGH (ref 70–99)
Glucose-Capillary: 173 mg/dL — ABNORMAL HIGH (ref 70–99)

## 2012-06-29 LAB — BASIC METABOLIC PANEL
BUN: 5 mg/dL — ABNORMAL LOW (ref 6–23)
CO2: 28 mEq/L (ref 19–32)
Chloride: 102 mEq/L (ref 96–112)
Creatinine, Ser: 0.73 mg/dL (ref 0.50–1.35)
GFR calc Af Amer: >90 mL/min (ref >90)
Potassium: 3.4 mEq/L — ABNORMAL LOW (ref 3.5–5.1)

## 2012-06-29 LAB — MAGNESIUM: Magnesium: 2.2 mg/dL (ref 1.5–2.5)

## 2012-06-29 LAB — CULTURE, BLOOD (ROUTINE X 2): Culture: NO GROWTH

## 2012-06-29 LAB — HEMOGLOBIN AND HEMATOCRIT, BLOOD: HCT: 34.3 % — ABNORMAL LOW (ref 39.0–52.0)

## 2012-06-29 MED ORDER — LACTULOSE 10 GM/15ML PO SOLN
30.0000 g | Freq: Every day | ORAL | Status: DC | PRN
  Administered 2012-07-01: 30 g via ORAL
  Filled 2012-06-29: qty 45

## 2012-06-29 MED ORDER — POTASSIUM CHLORIDE CRYS ER 20 MEQ PO TBCR
40.0000 meq | EXTENDED_RELEASE_TABLET | Freq: Two times a day (BID) | ORAL | Status: AC
  Administered 2012-06-29 (×2): 40 meq via ORAL
  Filled 2012-06-29 (×2): qty 2

## 2012-06-29 MED ORDER — POLYETHYLENE GLYCOL 3350 17 G PO PACK
17.0000 g | PACK | Freq: Two times a day (BID) | ORAL | Status: DC
  Administered 2012-06-29 – 2012-07-01 (×5): 17 g via ORAL
  Filled 2012-06-29 (×9): qty 1

## 2012-06-29 NOTE — Progress Notes (Signed)
[facility] PROGRESS NOTE  Chad Olson RUE:[id number] DOB: Apr 30, 1945 DOA: 06/22/2012 PCP: [name], MD  Assessment/Plan: Hx of DVT and bilateral PE  -repeated CT scan hematoma stable.  HB stable at 11. Patient was started on prophylaxis dose heparin.   -Recent Doppler of his lower extremity was negative for DVT.  -Repeated a CT angiogram of the chest which was negative for PE.  -Patient received 3 Month treatment for PE. -Agree with stopping Lovenox.  -Appreciate Dr Cyndie Chime recommendation. No indication for IVC filter.  -SCDs.   Pelvic hematoma: Patient relates worsening abdominal pain. Hematoma stable by CT scan. No orther pathology by CT scan.  Monitor hb on heparin. Abdominal pain probably secondary to hematoma.   Fever with mild leucocytosis  Probably from hematoma. Aspiration negative for bacteria.  Completed treatment with IV vancomycin for MRSA bacteremia. Vancomycin stopped 6-27.  Urine cx growing yeast, fluconazole stopped.  Patient S/P CT guide aspiration of pelvic hematoma 6-26  Constipation: No evidence of obstruction on CT scan. Fleet enema didn't work. Add lactulose.  Anemia  Received 2 units 6-25. Hb stable.  hemorrhoids  General surgery consulted and recommended sitz bath and ice packs for now. continue anusol rectal creams. Hypokalemia  40 Meq K-CL times 2 ordered. Mg at 2.2.  Hypertension  Blood pressure stable. Continue metoprolol  Hyperlipidemia  Continue statin.  bladder cancer: S/P surgery.  Treatment per primary team. Loopogram negative for leak.   Will continue to follow patient.   Procedures:  CT guide aspirate of pelvic hematoma.   Antibiotics:  Vancomycin 6-22  Zosyn 6-22  HPI/Subjective: He is feeling better. Notice improvement of abdominal pain. No BM yet. No nausea or vomiting.   Objective: Filed Vitals:   06/28/12 0441 06/28/12 1417 06/28/12 2021 06/29/12 0500  BP: 153/87 135/89 136/82 143/88  Pulse: 80 92 83  84  Temp: 98.2 F (36.8 C) 98.3 F (36.8 C) 97.7 F (36.5 C) 97.8 F (36.6 C)  TempSrc: Oral Oral Oral Oral  Resp: 18 16 16 18   Height:      Weight:      SpO2: 98% 100% 100% 98%    Intake/Output Summary (Last 24 hours) at 06/29/12 1303 Last data filed at 06/29/12 1031  Gross per 24 hour  Intake    240 ml  Output   3025 ml  Net  -2785 ml   Filed Weights   06/23/12 0405 06/23/12 0449  Weight: 103.2 kg (227 lb 8.2 oz) 100.109 kg (220 lb 11.2 oz)    Exam:   General:  No distress.   Cardiovascular: S 1, S 2 RRR  Respiratory: CTA  Abdomen: BS present, soft, NT, incision  healing,   Musculoskeletal: no edema.   Data Reviewed: Basic Metabolic Panel:  Recent Labs Lab 06/25/12 2237 06/26/12 0535 06/27/12 0415 06/28/12 0435 06/29/12 0500  NA 140 139 138 138 138  K 3.2* 3.0* 2.9* 3.3* 3.4*  CL 107 106 104 104 102  CO2 26 26 27 27 28   GLUCOSE 99 99 111* 125* 116*  BUN 6 6 6  5* 5*  CREATININE 0.83 0.84 0.90 0.82 0.73  CALCIUM 8.5 8.2* 8.4 8.6 8.8  MG  --   --   --   --  2.2   Liver Function Tests:  Recent Labs Lab 06/22/12 2243 06/23/12 0530  AST 15 16  ALT 11 12  ALKPHOS 65 61  BILITOT 0.3 0.3  PROT 7.2 6.7  ALBUMIN 3.1* 3.0*   No results found  for this basename: LIPASE, AMYLASE,  in the last 168 hours No results found for this basename: AMMONIA,  in the last 168 hours CBC:  Recent Labs Lab 06/22/12 2243  06/24/12 0412 06/25/12 0440 06/25/12 2237 06/26/12 0535 06/27/12 0415 06/28/12 0435 06/29/12 0400  WBC 9.8  < > 13.7* 11.9*  --  11.2* 11.7* 10.6*  --   NEUTROABS 6.6  --   --   --   --   --   --   --   --   HGB 11.6*  < > 8.9* 7.0* 8.3* 7.9* 9.9* 10.9* 11.0*  HCT 35.5*  < > 27.8* 21.6* 25.1* 24.5* 30.6* 33.2* 34.3*  MCV 90.3  < > 89.4 90.8  --  90.1 90.5 91.7  --   PLT 349  < > 378 233  --  230 281 329  --   < > = values in this interval not displayed. Cardiac Enzymes: No results found for this basename: CKTOTAL, CKMB, CKMBINDEX,  TROPONINI,  in the last 168 hours BNP (last 3 results) No results found for this basename: PROBNP,  in the last 8760 hours CBG:  Recent Labs Lab 06/28/12 1955 06/28/12 2355 06/29/12 0356 06/29/12 0756 06/29/12 1129  GLUCAP 116* 112* 111* 108* 173*    Recent Results (from the past 240 hour(s))  CULTURE, BLOOD (ROUTINE X 2)     Status: None   Collection Time    06/22/12 10:43 PM      Result Value Range Status   Specimen Description BLOOD LEFT ANTECUBITAL   Final   Special Requests BOTTLES DRAWN AEROBIC AND ANAEROBIC 5CC   Final   Culture  Setup Time 06/23/2012 17:47   Final   Culture     Final   Value:        BLOOD CULTURE RECEIVED NO GROWTH TO DATE CULTURE WILL BE HELD FOR 5 DAYS BEFORE ISSUING A FINAL NEGATIVE REPORT   Report Status PENDING   Incomplete  CULTURE, BLOOD (ROUTINE X 2)     Status: None   Collection Time    06/22/12 10:57 PM      Result Value Range Status   Specimen Description BLOOD LEFT ARM   Final   Special Requests BOTTLES DRAWN AEROBIC AND ANAEROBIC 10CC   Final   Culture  Setup Time 06/23/2012 17:47   Final   Culture     Final   Value:        BLOOD CULTURE RECEIVED NO GROWTH TO DATE CULTURE WILL BE HELD FOR 5 DAYS BEFORE ISSUING A FINAL NEGATIVE REPORT   Report Status PENDING   Incomplete  URINE CULTURE     Status: None   Collection Time    06/23/12  2:17 AM      Result Value Range Status   Specimen Description URINE, CATHETERIZED   Final   Special Requests Normal   Final   Culture  Setup Time 06/23/2012 15:21   Final   Colony Count 35,000 COLONIES/ML   Final   Culture YEAST   Final   Report Status 06/24/2012 FINAL   Final  MRSA PCR SCREENING     Status: None   Collection Time    06/24/12  9:58 AM      Result Value Range Status   MRSA by PCR NEGATIVE  NEGATIVE Final   Comment:            The GeneXpert MRSA Assay (FDA     approved for NASAL specimens  only), is one component of a     comprehensive MRSA colonization     surveillance program.  It is not     intended to diagnose MRSA     infection nor to guide or     monitor treatment for     MRSA infections.  CULTURE, ROUTINE-ABSCESS     Status: None   Collection Time    06/27/12 12:51 PM      Result Value Range Status   Specimen Description ABSCESS PELVIC   Final   Special Requests NONE   Final   Gram Stain     Final   Value: MODERATE WBC PRESENT, PREDOMINANTLY PMN     NO SQUAMOUS EPITHELIAL CELLS SEEN     NO ORGANISMS SEEN   Culture NO GROWTH 2 DAYS   Final   Report Status PENDING   Incomplete     Studies: Dg Loopogram  06/28/2012   *RADIOLOGY REPORT*  Clinical Data: 67 year old male with history of bladder carcinoma status post cystoprostatectomy with ileal conduit.  The ileal conduit was revised recently. Evaluate for new ureteral anastomosis leak.  Also postoperative pelvic hematoma status post recent percutaneous drainage attempt.  LOOPOGRAM  Comparison: CT abdomen and pelvis 06/27/2012 and earlier. Loopogram 05/15/2012.  Fluoroscopy time 2-minute-19-second.  Contrast: Approximately 80 ml cystografin.  Findings: Preprocedural scout KUB view of the abdomen demonstrate bilateral double J ureteral stent, the stent extend out through the ileal conduit and are looped in the ileostomy bag.  There is also partially visualized a left nephrostomy catheter.  Midline skin staples in place.  A Foley catheter was carefully inserted into the ileostomy/proximal ileal conduit and the balloon was inflated with a small volume of contrast.  Under fluoroscopic observation, cystogram was slowly injected.  No resistance to injection was encountered.  The distal ileal conduit filled readily.  As the distal ileal conduit was distended, and before any ureteral retrograde contrast opacification occurred, contrast began to leak from the skin site.  This was cleared, and additional contrast was injected with a gentle pressure at the ileostomy site.  During the spinal injection contrast readily refluxed  into the left ureter along the left double-J stent and to the left renal pelvis and collecting system (see post KUB).  No retrograde no contrast opacification of the right ureter occurred. No anastomosis leak or extravasation identified.  IMPRESSION: 1.  Opacification of the distal ileal conduit without evidence of leak. 2.  Opacification of the left ureter, and left renal collecting system. 3.  No opacification of the right ureter or renal collecting system.   Original Report Authenticated By: Erskine Speed, M.D.    Scheduled Meds: . acidophilus  1 capsule Oral Daily  . bisacodyl  10 mg Rectal BID  . feeding supplement  237 mL Oral BID BM  . feeding supplement  1 Container Oral BID BM  . heparin subcutaneous  5,000 Units Subcutaneous Q8H  . hydrocortisone   Rectal TID  . insulin aspart  0-15 Units Subcutaneous Q4H  . metoprolol tartrate  12.5 mg Oral BID  . multivitamin with minerals  1 tablet Oral Daily  . OxyCODONE  15 mg Oral Q12H  . pantoprazole  40 mg Oral Daily  . polyethylene glycol  17 g Oral Daily  . potassium chloride  40 mEq Oral BID  . senna-docusate  1 tablet Oral BID  . simvastatin  10 mg Oral q1800   Continuous Infusions:    Principal Problem:   Pelvic hematoma, male  Active Problems:   Bladder cancer   Acute pulmonary embolism (3/14)   DVT (deep venous thrombosis) 3/14   Fever   Bacteremia MRSA 5/14   VTE (venous thromboembolism)   Protein-calorie malnutrition, severe   Acute blood loss anemia    Time spent: 25 minutes.     REGALADO,BELKYS  [facility] Pager [phone]. If 7PM-7AM, please contact night-coverage at [email or web address], password [facility] 06/29/2012, 1:03 PM  LOS: 7 days

## 2012-06-29 NOTE — Progress Notes (Signed)
Subjective: Patient reports continued abdominal pain. He has had a little bit of gas, no significant bowel movement. He has not had significant regular diet yet  Objective: Vital signs in last 24 hours: Temp:  [97.7 F (36.5 C)-98.3 F (36.8 C)] 97.8 F (36.6 C) (06/28 0500) Pulse Rate:  [83-92] 84 (06/28 0500) Resp:  [16-18] 18 (06/28 0500) BP: (135-143)/(82-89) 143/88 mmHg (06/28 0500) SpO2:  [98 %-100 %] 98 % (06/28 0500)  Intake/Output from previous day: 06/27 0701 - 06/28 0700 In: -  Out: 3325 [Urine:3325] Intake/Output this shift:    Physical Exam:  Constitutional: Vital signs reviewed. WD WN in NAD   Eyes: PERRL, No scleral icterus.   Cardiovascular: RRR Pulmonary/Chest: Normal effort Abdominal: Distended, mild generalized tenderness. No rebound or guarding. Incision is healing well. Decreased bowel sounds noted. Genitourinary: Mild scrotal swelling/ecchymosis, improved per wife Extremities: No cyanosis or edema   Lab Results:  Recent Labs  06/27/12 0415 06/28/12 0435 06/29/12 0400  HGB 9.9* 10.9* 11.0*  HCT 30.6* 33.2* 34.3*   BMET  Recent Labs  06/28/12 0435 06/29/12 0500  NA 138 138  K 3.3* 3.4*  CL 104 102  CO2 27 28  GLUCOSE 125* 116*  BUN 5* 5*  CREATININE 0.82 0.73  CALCIUM 8.6 8.8   No results found for this basename: LABPT, INR,  in the last 72 hours No results found for this basename: LABURIN,  in the last 72 hours Results for orders placed during the hospital encounter of 06/22/12  CULTURE, BLOOD (ROUTINE X 2)     Status: None   Collection Time    06/22/12 10:43 PM      Result Value Range Status   Specimen Description BLOOD LEFT ANTECUBITAL   Final   Special Requests BOTTLES DRAWN AEROBIC AND ANAEROBIC 5CC   Final   Culture  Setup Time 06/23/2012 17:47   Final   Culture     Final   Value:        BLOOD CULTURE RECEIVED NO GROWTH TO DATE CULTURE WILL BE HELD FOR 5 DAYS BEFORE ISSUING A FINAL NEGATIVE REPORT   Report Status PENDING    Incomplete  CULTURE, BLOOD (ROUTINE X 2)     Status: None   Collection Time    06/22/12 10:57 PM      Result Value Range Status   Specimen Description BLOOD LEFT ARM   Final   Special Requests BOTTLES DRAWN AEROBIC AND ANAEROBIC 10CC   Final   Culture  Setup Time 06/23/2012 17:47   Final   Culture     Final   Value:        BLOOD CULTURE RECEIVED NO GROWTH TO DATE CULTURE WILL BE HELD FOR 5 DAYS BEFORE ISSUING A FINAL NEGATIVE REPORT   Report Status PENDING   Incomplete  URINE CULTURE     Status: None   Collection Time    06/23/12  2:17 AM      Result Value Range Status   Specimen Description URINE, CATHETERIZED   Final   Special Requests Normal   Final   Culture  Setup Time 06/23/2012 15:21   Final   Colony Count 35,000 COLONIES/ML   Final   Culture YEAST   Final   Report Status 06/24/2012 FINAL   Final  MRSA PCR SCREENING     Status: None   Collection Time    06/24/12  9:58 AM      Result Value Range Status   MRSA by PCR NEGATIVE  NEGATIVE Final   Comment:            The GeneXpert MRSA Assay (FDA     approved for NASAL specimens     only), is one component of a     comprehensive MRSA colonization     surveillance program. It is not     intended to diagnose MRSA     infection nor to guide or     monitor treatment for     MRSA infections.  CULTURE, ROUTINE-ABSCESS     Status: None   Collection Time    06/27/12 12:51 PM      Result Value Range Status   Specimen Description ABSCESS PELVIC   Final   Special Requests NONE   Final   Gram Stain     Final   Value: MODERATE WBC PRESENT, PREDOMINANTLY PMN     NO SQUAMOUS EPITHELIAL CELLS SEEN     NO ORGANISMS SEEN   Culture NO GROWTH 2 DAYS   Final   Report Status PENDING   Incomplete    Studies/Results: Ct Abdomen Pelvis Wo Contrast  06/27/2012   *RADIOLOGY REPORT*  Clinical Data: Evaluate pelvic hematoma  CT ABDOMEN AND PELVIS WITHOUT CONTRAST  Technique:  Multidetector CT imaging of the abdomen and pelvis was performed  following the standard protocol without intravenous contrast. Note, the examination was performed with the patient positioned prone prior to impending percutaneous pelvic fluid collection aspiration.  Comparison: CTA of the abdomen pelvis - 06/24/2012; abdominal CT - 06/23/2012; 06/02/2012; chest radiograph - 06/23/2012  Findings:  The lack of intravenous contrast limits the ability to evaluate solid abdominal organs.  Note, examination was performed with the patient positioned prone prior to impending percutaneous pelvic fluid collection aspiration.  The previously described pelvic hematoma is grossly unchanged to minimally decreased in size, measuring approximately 20 x 8.5 cm in greatest oblique sagittal dimension (sagittal image 75, series 603), previously, 22 x 9.4 cm.  There is increased attenuation of the blood products within the hematoma suggestive of interval clot formation.  There is persistent mass effect upon the adjacent rectum.  There is no definitive stranding about the known pelvic fluid collection.  There is extensive stranding noted about the imaged cranial aspect of the scrotum.  There is a minimal amount of free fluid lying dependently within the left anterior upper abdominal quadrant (image 24) and caudal to the right lobe of the liver (image 33 - note again the patient was imaged lying prone). No new organized fluid collection on this noncontrast examination.  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  No urinary obstruction. Grossly symmetric likely age related perinephric stranding.  The previously characterized right-sided renal cysts appear grossly unchanged on this noncontrast examination.  No definite renal stones.  Normal noncontrast appearance of the bilateral adrenal glands, pancreas and spleen.  Normal noncontrast appearance of the liver.  The gallbladder is distended.   There is potentially minimal amount of pericholecystic fluid on this noncontrast examination.  Previously the ingested enteric contrast extends to the level of the distal sigmoid colon.  No definite evidence of enteric obstruction.  No pneumoperitoneum, pneumatosis or portal venous gas.  There is scattered minimal amount of atherosclerotic plaque within a mildly tortuous but normal caliber abdominal aorta.  No definite retroperitoneal, mesenteric, pelvic or inguinal lymphadenopathy on this noncontrast examination.  Limited visualization of the lower thorax demonstrates minimal dependent subsegmental atelectasis. Borderline cardiomegaly. No  pericardial effusion.  Known right upper extremity approach PICC line tip terminates at the superior cavoatrial junction.  No acute or aggressive osseous abnormalities.  Mild to moderate multilevel thoracolumbar spine degenerative change.  IMPRESSION: 1.  No change to minimal decrease in size of known pelvic hematoma. There is increased attenuation of the blood products within the hematoma compatible with interval coagulation. No new organized intra-abdominal fluid collections. 2.  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  No evidence of urinary obstruction.  3.  Interval passage of enteric contrast, now seen within the distal sigmoid colon.  No definite evidence of enteric obstruction.   Original Report Authenticated By: Tacey Ruiz, MD   Dg Loopogram  06/28/2012   *RADIOLOGY REPORT*  Clinical Data: 67 year old male with history of bladder carcinoma status post cystoprostatectomy with ileal conduit.  The ileal conduit was revised recently. Evaluate for new ureteral anastomosis leak.  Also postoperative pelvic hematoma status post recent percutaneous drainage attempt.  LOOPOGRAM  Comparison: CT abdomen and pelvis 06/27/2012 and earlier. Loopogram 05/15/2012.  Fluoroscopy  time 2-minute-19-second.  Contrast: Approximately 80 ml cystografin.  Findings: Preprocedural scout KUB view of the abdomen demonstrate bilateral double J ureteral stent, the stent extend out through the ileal conduit and are looped in the ileostomy bag.  There is also partially visualized a left nephrostomy catheter.  Midline skin staples in place.  A Foley catheter was carefully inserted into the ileostomy/proximal ileal conduit and the balloon was inflated with a small volume of contrast.  Under fluoroscopic observation, cystogram was slowly injected.  No resistance to injection was encountered.  The distal ileal conduit filled readily.  As the distal ileal conduit was distended, and before any ureteral retrograde contrast opacification occurred, contrast began to leak from the skin site.  This was cleared, and additional contrast was injected with a gentle pressure at the ileostomy site.  During the spinal injection contrast readily refluxed into the left ureter along the left double-J stent and to the left renal pelvis and collecting system (see post KUB).  No retrograde no contrast opacification of the right ureter occurred. No anastomosis leak or extravasation identified.  IMPRESSION: 1.  Opacification of the distal ileal conduit without evidence of leak. 2.  Opacification of the left ureter, and left renal collecting system. 3.  No opacification of the right ureter or renal collecting system.   Original Report Authenticated By: Erskine Speed, M.D.   Ct Aspiration  06/27/2012   *RADIOLOGY REPORT*  Indication: History of right lower quadrant ileostomy creation, now with symptomatic, potentially infected pelvic hematoma  CT GUIDED ASPIRATION OF PELVIC HEMATOMA VIA LEFT TRANSGLUTEAL APPROACH  Comparison: CT of the abdomen pelvis - earlier same day; 06/24/2012; 06/23/2012; 06/02/2012  Medications: Fentanyl 200 mcg IV; Versed 2 mg IV  Contrast: None  Sedation time: 20 minutes  Complications: None immediate   TECHNIQUE/FINDINGS:  Informed consent was obtained from the patient following an explanation of the procedure, risks, benefits and alternatives.  A time out was performed prior to the initiation of the procedure.  The patient remained prone  on the CT table (this CT guided aspiration was immediately preceded by acquisition of a noncontrast CT of the abdomen pelvis) and a limited CT was performed for procedural planning demonstrating grossly unchanged appearance of the known pelvic hematoma.  The procedure was planned.  The operative site was prepped and draped in the usual sterile fashion. Utilizing a left-sided transgluteal  approach, appropriate trajectory was confirmed with a 22 gauge spinal needle after the adjacent tissues were anesthetized with 1% Lidocaine with epinephrine.  Under intermittent CT guidance, a 5-French multi side-hole Yueh sheath needle was advanced into the peripheral aspect of the pelvic fluid hematoma.  No fluid was able to be aspirated from the collection.  An Amplatz wire was coiled within the fluid collection allowing for placement of an 8-French [facility] all-purpose drainage catheter.  Appropriate positioning was confirmed however only approximately 5 ml of bloody fluid was able to be aspirated from the pelvic fluid collection. The sample was capped and sent to the laboratory for analysis.  The drainage catheter was removed.  Under intermittent CT guidance, the Yueh sheath needle was advanced into the more central aspect of the pelvic fluid collection however again, no fluid was able to be aspirated from the collection.  At this point, the procedure was terminated.  The Yueh sheath needle was removed and hemostasis was achieved with manual compression.  A dressing was placed.  The patient tolerated the procedure well without immediate postprocedural complication.  IMPRESSION:  Technically successful CT guided aspiration of only approximately 5 ml of blood tinged fluid despite the placement of  a temporary 8- [place] all purpose drainage catheter.  The aspirated fluid was capped and sent to the laboratory for analysis.   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 Healed well initially and sent home 6/17.  2 - Fever / h/o Bacteruria - Pt with MRSA in urine by hospital UCX 03/2012 and again subsequently in blood. Now on Vancomycin daily per ID x 4-6 week total (nearing end of course). Most recent UCX, BCX negative, New set pending from 6/22. Pt readmitted with low-grade fever 6/22 to 100.7 and malaise, no sig leukocytosis. Now on Vanc + Zosyn emirically. UCX with yeast (now getting diflucan x 3 days), BCX no growth to date. ID now following and agress pelvic fluid sample (collected 6/26) to verify no growth.  3 - Heme / Recent DVT + PE -incidental PE by CT 03/2012 now on Lovenox, DVT resolved by most recent LE duplex. Repeat CT-PE 6/23 without PE or active bleed from pelvis and Lovenox DC'd. Hgb drift to 7 6/24 and transfused 2 u with response to 8, transfused 2u additional 6/25 with increase to 10. Hematology consult [name]) also agrees no treatment dose anticoagulation at this point, but resume proph dosing since he is not ambulating much.  4 - Pelvic Fluid Collection - Pelvic fluid collection without rim-enhancement noted on CT from ER 6/22. Appears partially simple fluid with some dependent blood by HU analysis, no active extrav of blood or urine by imaging. IR aspiration performed 6/26 and fluid c/w hematoma only (not purulent, not simple/drainable).  5 - Hemorrhoids /  Lower GI Bleed - Long h/o hemorroids. Pt with worsened / friable bleeding hemorroids on admission that are quite bothersome. No prior specific intervention, likely exacerbated by pelvic fluid collection / hematoma. Gen Surg recs conservative measures.  He has a mild ileus, treated with Dulcolax. Clinically today he looks stable. Abdomen is mildly distended with appropriate bowel sounds. Hemoglobin is up slightly, renal function is excellent. I will make sure  he gets up and walks around. Continues with twice daily Dulcolax. I spoke with wife who would like to consider rehabilitation bed following discharge. I put that order in.  LOS: 7 days   Marcine Matar M 06/29/2012, 8:45 AM

## 2012-06-30 LAB — CULTURE, ROUTINE-ABSCESS: Culture: NO GROWTH

## 2012-06-30 LAB — BASIC METABOLIC PANEL
CO2: 29 mEq/L (ref 19–32)
Calcium: 8.8 mg/dL (ref 8.4–10.5)
Creatinine, Ser: 0.72 mg/dL (ref 0.50–1.35)
GFR calc Af Amer: >90 mL/min (ref >90)
GFR calc non Af Amer: >90 mL/min (ref >90)
Sodium: 137 mEq/L (ref 135–145)

## 2012-06-30 LAB — GLUCOSE, CAPILLARY: Glucose-Capillary: 136 mg/dL — ABNORMAL HIGH (ref 70–99)

## 2012-06-30 LAB — HEMOGLOBIN AND HEMATOCRIT, BLOOD: Hemoglobin: 11.4 g/dL — ABNORMAL LOW (ref 13.0–17.0)

## 2012-06-30 MED ORDER — POTASSIUM CHLORIDE CRYS ER 20 MEQ PO TBCR
40.0000 meq | EXTENDED_RELEASE_TABLET | Freq: Two times a day (BID) | ORAL | Status: AC
  Administered 2012-06-30 (×2): 40 meq via ORAL
  Filled 2012-06-30 (×2): qty 2

## 2012-06-30 MED ORDER — MORPHINE SULFATE 2 MG/ML IJ SOLN
2.0000 mg | INTRAMUSCULAR | Status: DC | PRN
  Administered 2012-06-30: 4 mg via INTRAVENOUS
  Administered 2012-07-01: 2 mg via INTRAVENOUS
  Administered 2012-07-01: 4 mg via INTRAVENOUS
  Administered 2012-07-02: 2 mg via INTRAVENOUS
  Administered 2012-07-02: 4 mg via INTRAVENOUS
  Filled 2012-06-30: qty 2
  Filled 2012-06-30: qty 1
  Filled 2012-06-30 (×3): qty 2
  Filled 2012-06-30: qty 1

## 2012-06-30 MED ORDER — SODIUM CHLORIDE 0.9 % IV SOLN
INTRAVENOUS | Status: DC
  Administered 2012-06-30 – 2012-07-03 (×2): via INTRAVENOUS

## 2012-06-30 NOTE — Progress Notes (Signed)
[facility] PROGRESS NOTE  HENDERSON FRAMPTON WUJ:[id number] DOB: 03-22-1945 DOA: 06/22/2012 PCP: [name], MD  Assessment/Plan:  Constipation/Ileus: No evidence of obstruction on CT scan 6-26. \ Add lactulose. Will order KUB if no BM by tomorrow. Continue with supportive care. IV fluids. Limit narcotics. Replete electrolytes. Ambulation.   Hx of DVT and bilateral PE :  -repeated CT scan hematoma stable.  HB stable at 11. Patient was started on prophylaxis dose heparin.   -Recent Doppler of his lower extremity was negative for DVT.  -Repeated a CT angiogram of the chest which was negative for PE.  -Patient received 3 Month treatment for PE. -Agree with stopping Lovenox.  -Appreciate Dr Cyndie Chime recommendation. No indication for IVC filter.  -SCDs.   Pelvic hematoma: Patient relates worsening abdominal pain. Hematoma stable by CT scan. No orther pathology by CT scan.  Monitor hb on heparin. Abdominal pain probably secondary to hematoma.   Fever with mild leucocytosis : Resolved.  Probably from hematoma. Aspiration negative for bacteria.  Completed treatment with IV vancomycin for MRSA bacteremia. Vancomycin stopped 6-27.  Urine cx growing yeast, fluconazole stopped.  Patient S/P CT guide aspiration of pelvic hematoma 6-26  Anemia  Received 2 units 6-25. Hb stable.  hemorrhoids  General surgery consulted and recommended sitz bath and ice packs for now. continue anusol rectal creams. Hypokalemia  Resolved.  Hypertension  Blood pressure stable. Continue metoprolol  Hyperlipidemia  Continue statin.  bladder cancer: S/P surgery.  Treatment per primary team. Loopogram negative for leak.   Will continue to follow patient.   Procedures:  CT guide aspirate of pelvic hematoma.   Antibiotics:  Vancomycin 6-22  Zosyn 6-22  HPI/Subjective: No nausea or vomiting. He understand we have to limit pain medications. Needs to ambulate.   Objective: Filed Vitals:   06/29/12 0500 06/29/12 1438 06/29/12 2113 06/30/12 0623  BP: 143/88 132/91 138/74 147/91  Pulse: 84 90 88 86  Temp: 97.8 F (36.6 C) 97.5 F (36.4 C) 98.2 F (36.8 C) 98 F (36.7 C)  TempSrc: Oral Oral Oral Oral  Resp: 18 20 18 16   Height:      Weight:      SpO2: 98% 100% 99% 98%    Intake/Output Summary (Last 24 hours) at 06/30/12 1219 Last data filed at 06/30/12 9562  Gross per 24 hour  Intake    120 ml  Output   2525 ml  Net  -2405 ml   Filed Weights   06/23/12 0405 06/23/12 0449  Weight: 103.2 kg (227 lb 8.2 oz) 100.109 kg (220 lb 11.2 oz)    Exam:   General:  No distress.   Cardiovascular: S 1, S 2 RRR  Respiratory: CTA  Abdomen: BS decrease, soft, NT, incision  healing, distended.   Musculoskeletal: no edema.   Data Reviewed: Basic Metabolic Panel:  Recent Labs Lab 06/26/12 0535 06/27/12 0415 06/28/12 0435 06/29/12 0500 06/30/12 0350  NA 139 138 138 138 137  K 3.0* 2.9* 3.3* 3.4* 3.6  CL 106 104 104 102 101  CO2 26 27 27 28 29   GLUCOSE 99 111* 125* 116* 110*  BUN 6 6 5* 5* 6  CREATININE 0.84 0.90 0.82 0.73 0.72  CALCIUM 8.2* 8.4 8.6 8.8 8.8  MG  --   --   --  2.2  --    Liver Function Tests: No results found for this basename: AST, ALT, ALKPHOS, BILITOT, PROT, ALBUMIN,  in the last 168 hours No results found for this basename:  LIPASE, AMYLASE,  in the last 168 hours No results found for this basename: AMMONIA,  in the last 168 hours CBC:  Recent Labs Lab 06/24/12 0412 06/25/12 0440  06/26/12 0535 06/27/12 0415 06/28/12 0435 06/29/12 0400 06/30/12 0350  WBC 13.7* 11.9*  --  11.2* 11.7* 10.6*  --   --   HGB 8.9* 7.0*  < > 7.9* 9.9* 10.9* 11.0* 11.4*  HCT 27.8* 21.6*  < > 24.5* 30.6* 33.2* 34.3* 36.0*  MCV 89.4 90.8  --  90.1 90.5 91.7  --   --   PLT 378 233  --  230 281 329  --   --   < > = values in this interval not displayed. Cardiac Enzymes: No results found for this basename: CKTOTAL, CKMB, CKMBINDEX, TROPONINI,  in the last  168 hours BNP (last 3 results) No results found for this basename: PROBNP,  in the last 8760 hours CBG:  Recent Labs Lab 06/29/12 1648 06/29/12 2035 06/30/12 0006 06/30/12 0830 06/30/12 1130  GLUCAP 108* 110* 144* 112* 113*    Recent Results (from the past 240 hour(s))  CULTURE, BLOOD (ROUTINE X 2)     Status: None   Collection Time    06/22/12 10:43 PM      Result Value Range Status   Specimen Description BLOOD LEFT ANTECUBITAL   Final   Special Requests BOTTLES DRAWN AEROBIC AND ANAEROBIC 5CC   Final   Culture  Setup Time 06/23/2012 17:47   Final   Culture NO GROWTH 5 DAYS   Final   Report Status 06/29/2012 FINAL   Final  CULTURE, BLOOD (ROUTINE X 2)     Status: None   Collection Time    06/22/12 10:57 PM      Result Value Range Status   Specimen Description BLOOD LEFT ARM   Final   Special Requests BOTTLES DRAWN AEROBIC AND ANAEROBIC 10CC   Final   Culture  Setup Time 06/23/2012 17:47   Final   Culture NO GROWTH 5 DAYS   Final   Report Status 06/29/2012 FINAL   Final  URINE CULTURE     Status: None   Collection Time    06/23/12  2:17 AM      Result Value Range Status   Specimen Description URINE, CATHETERIZED   Final   Special Requests Normal   Final   Culture  Setup Time 06/23/2012 15:21   Final   Colony Count 35,000 COLONIES/ML   Final   Culture YEAST   Final   Report Status 06/24/2012 FINAL   Final  MRSA PCR SCREENING     Status: None   Collection Time    06/24/12  9:58 AM      Result Value Range Status   MRSA by PCR NEGATIVE  NEGATIVE Final   Comment:            The GeneXpert MRSA Assay (FDA     approved for NASAL specimens     only), is one component of a     comprehensive MRSA colonization     surveillance program. It is not     intended to diagnose MRSA     infection nor to guide or     monitor treatment for     MRSA infections.  CULTURE, ROUTINE-ABSCESS     Status: None   Collection Time    06/27/12 12:51 PM      Result Value Range Status    Specimen Description ABSCESS PELVIC  Final   Special Requests NONE   Final   Gram Stain     Final   Value: MODERATE WBC PRESENT, PREDOMINANTLY PMN     NO SQUAMOUS EPITHELIAL CELLS SEEN     NO ORGANISMS SEEN   Culture NO GROWTH 3 DAYS   Final   Report Status 22-Feb-202014 FINAL   Final     Studies: No results found.  Scheduled Meds: . acidophilus  1 capsule Oral Daily  . bisacodyl  10 mg Rectal BID  . feeding supplement  237 mL Oral BID BM  . feeding supplement  1 Container Oral BID BM  . heparin subcutaneous  5,000 Units Subcutaneous Q8H  . hydrocortisone   Rectal TID  . insulin aspart  0-15 Units Subcutaneous Q4H  . metoprolol tartrate  12.5 mg Oral BID  . multivitamin with minerals  1 tablet Oral Daily  . OxyCODONE  15 mg Oral Q12H  . pantoprazole  40 mg Oral Daily  . polyethylene glycol  17 g Oral BID  . senna-docusate  1 tablet Oral BID  . simvastatin  10 mg Oral q1800   Continuous Infusions:    Principal Problem:   Pelvic hematoma, male Active Problems:   Bladder cancer   Acute pulmonary embolism (3/14)   DVT (deep venous thrombosis) 3/14   Fever   Bacteremia MRSA 5/14   VTE (venous thromboembolism)   Protein-calorie malnutrition, severe   Acute blood loss anemia    Time spent: 25 minutes.     REGALADO,BELKYS  [facility] Pager [phone]. If 7PM-7AM, please contact night-coverage at [email or web address], password [facility] Dec 13, 202014, 12:19 PM  LOS: 8 days

## 2012-06-30 NOTE — Progress Notes (Signed)
Subjective: Patient reports less pain. Still having some cramps and not much flatus/stool.   Objective: Vital signs in last 24 hours: Temp:  [97.5 F (36.4 C)-98.2 F (36.8 C)] 98 F (36.7 C) (06/29 0623) Pulse Rate:  [86-90] 86 (06/29 0623) Resp:  [16-20] 16 (06/29 0623) BP: (132-147)/(74-91) 147/91 mmHg (06/29 0623) SpO2:  [98 %-100 %] 98 % (06/29 0623)  Intake/Output from previous day: 06/28 0701 - 06/29 0700 In: 360 [P.O.:360] Out: 3025 [Urine:3025] Intake/Output this shift: Total I/O In: -  Out: 1925 [Urine:1925]  Physical Exam:  Constitutional: Vital signs reviewed. WD WN in NAD   Eyes: PERRL, No scleral icterus.   Cardiovascular: RRR Pulmonary/Chest: Normal effort Abdominal: Soft. Non-tender, and a a but he still has significant tympany. Bowel sounds are present but hypoactive. No rebound or guarding.    Lab Results:  Recent Labs  06/28/12 0435 06/29/12 0400 06/30/12 0350  HGB 10.9* 11.0* 11.4*  HCT 33.2* 34.3* 36.0*   BMET  Recent Labs  06/29/12 0500 06/30/12 0350  NA 138 137  K 3.4* 3.6  CL 102 101  CO2 28 29  GLUCOSE 116* 110*  BUN 5* 6  CREATININE 0.73 0.72  CALCIUM 8.8 8.8   No results found for this basename: LABPT, INR,  in the last 72 hours No results found for this basename: LABURIN,  in the last 72 hours Results for orders placed during the hospital encounter of 06/22/12  CULTURE, BLOOD (ROUTINE X 2)     Status: None   Collection Time    06/22/12 10:43 PM      Result Value Range Status   Specimen Description BLOOD LEFT ANTECUBITAL   Final   Special Requests BOTTLES DRAWN AEROBIC AND ANAEROBIC 5CC   Final   Culture  Setup Time 06/23/2012 17:47   Final   Culture NO GROWTH 5 DAYS   Final   Report Status 06/29/2012 FINAL   Final  CULTURE, BLOOD (ROUTINE X 2)     Status: None   Collection Time    06/22/12 10:57 PM      Result Value Range Status   Specimen Description BLOOD LEFT ARM   Final   Special Requests BOTTLES DRAWN  AEROBIC AND ANAEROBIC 10CC   Final   Culture  Setup Time 06/23/2012 17:47   Final   Culture NO GROWTH 5 DAYS   Final   Report Status 06/29/2012 FINAL   Final  URINE CULTURE     Status: None   Collection Time    06/23/12  2:17 AM      Result Value Range Status   Specimen Description URINE, CATHETERIZED   Final   Special Requests Normal   Final   Culture  Setup Time 06/23/2012 15:21   Final   Colony Count 35,000 COLONIES/ML   Final   Culture YEAST   Final   Report Status 06/24/2012 FINAL   Final  MRSA PCR SCREENING     Status: None   Collection Time    06/24/12  9:58 AM      Result Value Range Status   MRSA by PCR NEGATIVE  NEGATIVE Final   Comment:            The GeneXpert MRSA Assay (FDA     approved for NASAL specimens     only), is one component of a     comprehensive MRSA colonization     surveillance program. It is not     intended to diagnose MRSA  infection nor to guide or     monitor treatment for     MRSA infections.  CULTURE, ROUTINE-ABSCESS     Status: None   Collection Time    06/27/12 12:51 PM      Result Value Range Status   Specimen Description ABSCESS PELVIC   Final   Special Requests NONE   Final   Gram Stain     Final   Value: MODERATE WBC PRESENT, PREDOMINANTLY PMN     NO SQUAMOUS EPITHELIAL CELLS SEEN     NO ORGANISMS SEEN   Culture NO GROWTH 2 DAYS   Final   Report Status PENDING   Incomplete    Studies/Results: Dg Loopogram  06/28/2012   *RADIOLOGY REPORT*  Clinical Data: 67 year old male with history of bladder carcinoma status post cystoprostatectomy with ileal conduit.  The ileal conduit was revised recently. Evaluate for new ureteral anastomosis leak.  Also postoperative pelvic hematoma status post recent percutaneous drainage attempt.  LOOPOGRAM  Comparison: CT abdomen and pelvis 06/27/2012 and earlier. Loopogram 05/15/2012.  Fluoroscopy time 2-minute-19-second.  Contrast: Approximately 80 ml cystografin.  Findings: Preprocedural scout KUB  view of the abdomen demonstrate bilateral double J ureteral stent, the stent extend out through the ileal conduit and are looped in the ileostomy bag.  There is also partially visualized a left nephrostomy catheter.  Midline skin staples in place.  A Foley catheter was carefully inserted into the ileostomy/proximal ileal conduit and the balloon was inflated with a small volume of contrast.  Under fluoroscopic observation, cystogram was slowly injected.  No resistance to injection was encountered.  The distal ileal conduit filled readily.  As the distal ileal conduit was distended, and before any ureteral retrograde contrast opacification occurred, contrast began to leak from the skin site.  This was cleared, and additional contrast was injected with a gentle pressure at the ileostomy site.  During the spinal injection contrast readily refluxed into the left ureter along the left double-J stent and to the left renal pelvis and collecting system (see post KUB).  No retrograde no contrast opacification of the right ureter occurred. No anastomosis leak or extravasation identified.  IMPRESSION: 1.  Opacification of the distal ileal conduit without evidence of leak. 2.  Opacification of the left ureter, and left renal collecting system. 3.  No opacification of the right ureter or renal collecting system.   Original Report Authenticated By: Erskine Speed, M.D.    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 Healed well initially and sent home 6/17.  2 - Fever / h/o Bacteruria - Pt with MRSA in urine by hospital UCX 03/2012 and again subsequently in  blood. Now on Vancomycin daily per ID x 4-6 week total (nearing end of course). Most recent UCX, BCX negative, New set pending from 6/22. Pt readmitted with low-grade fever 6/22 to 100.7 and malaise, no sig leukocytosis. Now off of antibiotics. He has been afebrile 3 - Heme / Recent DVT + PE -incidental PE by CT 03/2012 now on Lovenox, DVT resolved by most recent LE duplex. Repeat CT-PE 6/23 without PE or active bleed from pelvis and Lovenox DC'd. Hgb drift to 7 6/24 and transfused 2 u with response to 8, transfused 2u additional 6/25 with increase to 10.  Hematology consult [name]) also agrees no treatment dose anticoagulation at this point, but resume proph dosing since he is not ambulating much.  4 - Pelvic Fluid Collection - Pelvic fluid collection without rim-enhancement noted on CT from ER 6/22. Appears partially simple fluid with some dependent blood by HU analysis, no active extrav of blood or urine by imaging. IR aspiration performed 6/26 and fluid c/w hematoma only (not purulent, not simple/drainable). He is having less pain from this, but persistent ileus 5 - Hemorrhoids / Lower GI Bleed - Long h/o hemorroids. Pt with worsened / friable bleeding hemorroids on admission that are quite bothersome. No prior specific intervention, likely exacerbated by pelvic fluid collection / hematoma. Gen Surg recs conservative measures. 6.-Ileus, mild to moderate, persistent. He is tolerating by mouth, although decreased appetite  The patient and his wife would like a rehabilitation bed following discharge. It will take at least another day or 2 for his ileus to resolve    LOS: 8 days   Marcine Matar M 03/30/202014, 6:55 AM

## 2012-06-30 NOTE — Progress Notes (Signed)
Pt ambulated form room to nursing station and back to room. Felt exhausted after ambulation and fell asleep. Tolerated walk. Vwilliams,rn.

## 2012-07-01 ENCOUNTER — Encounter: Payer: Self-pay | Admitting: Internal Medicine

## 2012-07-01 DIAGNOSIS — R110 Nausea: Secondary | ICD-10-CM

## 2012-07-01 DIAGNOSIS — N501 Vascular disorders of male genital organs: Secondary | ICD-10-CM

## 2012-07-01 DIAGNOSIS — K5900 Constipation, unspecified: Secondary | ICD-10-CM

## 2012-07-01 DIAGNOSIS — R1319 Other dysphagia: Secondary | ICD-10-CM

## 2012-07-01 LAB — BASIC METABOLIC PANEL
CO2: 30 mEq/L (ref 19–32)
Calcium: 9.3 mg/dL (ref 8.4–10.5)
Chloride: 98 mEq/L (ref 96–112)
Glucose, Bld: 125 mg/dL — ABNORMAL HIGH (ref 70–99)
Potassium: 4.2 mEq/L (ref 3.5–5.1)
Sodium: 135 mEq/L (ref 135–145)

## 2012-07-01 LAB — GLUCOSE, CAPILLARY
Glucose-Capillary: 109 mg/dL — ABNORMAL HIGH (ref 70–99)
Glucose-Capillary: 126 mg/dL — ABNORMAL HIGH (ref 70–99)
Glucose-Capillary: 137 mg/dL — ABNORMAL HIGH (ref 70–99)

## 2012-07-01 LAB — HEMOGLOBIN AND HEMATOCRIT, BLOOD: HCT: 40.7 % (ref 39.0–52.0)

## 2012-07-01 MED ORDER — ONDANSETRON HCL 4 MG/2ML IJ SOLN
4.0000 mg | INTRAMUSCULAR | Status: DC
  Administered 2012-07-01 – 2012-07-24 (×133): 4 mg via INTRAVENOUS
  Filled 2012-07-01 (×135): qty 2

## 2012-07-01 MED ORDER — ENSURE COMPLETE PO LIQD
237.0000 mL | Freq: Two times a day (BID) | ORAL | Status: DC
  Administered 2012-07-02: 237 mL via ORAL

## 2012-07-01 MED ORDER — MAGNESIUM HYDROXIDE 400 MG/5ML PO SUSP
960.0000 mL | Freq: Once | ORAL | Status: AC
  Administered 2012-07-01: 960 mL via RECTAL
  Filled 2012-07-01: qty 240

## 2012-07-01 NOTE — Progress Notes (Signed)
NUTRITION FOLLOW UP  Intervention:   - Chocolate Ensure Complete BID as pt getting tired of vanilla Glucerna shake flavor - Assisted pt with ordering meals - Bowel regimen per MD - Will continue to monitor   New nutrition dx: Inadequate oral intake related to inability to eat as evidenced by NPO - no longer appropriate, diet advanced.   New nutrition dx: Inadequate oral intake related to poor appetite, gas pain, constipation as evidenced by pt consuming only bites of food at mealtimes.   Goal:   1. No further nausea/vomiting - met 2. Pt to consume >90% of meals/supplements - met with supplements but not with meals 3. Diet advancement - met, diet advanced to regular   Monitor:   Weights, labs, intake, BM  Assessment:   Met with pt and family. Family reports pt with continued poor appetite. Family reports pt wants to eat but when the food gets there he will only eat a few bites at mealtimes. Family reports pt has been consuming 2 of each nutritional supplement of Glucerna shake and Resource Breeze - provides 940 calories, 38 grams of protein. Family reports pt has not had a BM for 5 days and pt c/o gas pain. MD notes pt with a mild ileus and ordered Dulcolax BID.     Height: Ht Readings from Last 1 Encounters:  06/23/12 5\' 11"  (1.803 m)    Weight Status:   Wt Readings from Last 1 Encounters:  06/23/12 220 lb 11.2 oz (100.109 kg)    Re-estimated needs:  Kcal: 1950-2100  Protein: 80-95g  Fluid: 1.9-2.1L/day   Skin: Mid abdominal incision, right buttocks abrasion, back left skin tear, left buttocks incision   Diet Order: General   Intake/Output Summary (Last 24 hours) at 07/01/12 1037 Last data filed at 07/01/12 0600  Gross per 24 hour  Intake   1475 ml  Output   1875 ml  Net   -400 ml    Last BM: 6/27   Labs:   Recent Labs Lab 06/29/12 0500 06/30/12 0350 07/01/12 0455  NA 138 137 135  K 3.4* 3.6 4.2  CL 102 101 98  CO2 28 29 30   BUN 5* 6 8   CREATININE 0.73 0.72 0.86  CALCIUM 8.8 8.8 9.3  MG 2.2  --   --   GLUCOSE 116* 110* 125*    CBG (last 3)   Recent Labs  06/30/12 2356 07/01/12 0401 07/01/12 0722  GLUCAP 140* 126* 137*    Scheduled Meds: . acidophilus  1 capsule Oral Daily  . bisacodyl  10 mg Rectal BID  . feeding supplement  237 mL Oral BID BM  . feeding supplement  1 Container Oral BID BM  . heparin subcutaneous  5,000 Units Subcutaneous Q8H  . hydrocortisone   Rectal TID  . insulin aspart  0-15 Units Subcutaneous Q4H  . metoprolol tartrate  12.5 mg Oral BID  . multivitamin with minerals  1 tablet Oral Daily  . ondansetron  4 mg Intravenous Q4H  . pantoprazole  40 mg Oral Daily  . polyethylene glycol  17 g Oral BID  . senna-docusate  1 tablet Oral BID  . simvastatin  10 mg Oral q1800    Continuous Infusions: . sodium chloride 50 mL/hr at 06/30/12 2028     Levon Hedger MS, RD, LDN [phone] Pager [phone] After Hours Pager
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
## 2012-07-01 NOTE — Progress Notes (Signed)
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 Healed well initially and sent home 6/17. Loopogram 6/27 without any internal leak.  2 - Fever / h/o Bacteruria - Pt with MRSA in urine by hospital UCX 03/2012 and again subsequently in blood. Now on Vancomycin daily per ID x 4-6 week total (nearing end of course). Most recent UCX, BCX negative, New set pending from 6/22. Pt readmitted with low-grade fever 6/22 to 100.7 and malaise, no sig leukocytosis. Now on Vanc + Zosyn emirically. UCX with yeast (now getting diflucan x 3 days), BCX no growth to date.Pelvic fluid aspirate and no growth / final.  ID now following,and DC'd all ABX given eval.  3 - Heme / Recent DVT + PE -incidental PE by CT 03/2012 now on Lovenox, DVT resolved by most recent LE duplex. Repeat CT-PE 6/23 without PE or active bleed from pelvis and Lovenox DC'd. Hgb drift to 7 6/24 and transfused 2 u with response to 8, transfused 2u additional 6/25 with increase to 10. Hematology consult [name]) also agrees no treatment dose anticoagulation at this point, but resume proph dosing since he is not ambulating much.  4 - Pelvic Fluid Collection - Pelvic fluid collection without rim-enhancement noted on CT from ER 6/22. Appears partially simple fluid with some dependent blood by HU analysis, no active extrav of blood or urine by imaging. IR aspiration performed 6/26 and fluid c/w hematoma only (not purulent, not simple/drainable).   5 - Hemorrhoids / Lower GI Bleed - Long h/o hemorroids. Pt with worsened / friable bleeding hemorroids on admission that are quite bothersome. No  prior specific intervention, likely exacerbated by pelvic fluid collection / hematoma. Gen Surg recs conservative measures.  6 - Nutrition / Nausea - Pt with some element of baseline GI issues with GERD and piror small esophageal polyp. Has had exacerbation of nausea in house and not meeting nutritional goals. GI consult 6/30 and has changed nausea med regimen to combination of scheduled an prn meds.  7 - Disposition - PT eval has recommended DC to SNF when appropriate and family amenable.  PMH sig for obesity and left knee replacement, PE + DVT (now resolved). No CV disease.   Today Cruise is still c/o hemorrhoids and poor apetite. No fevers. Hgb now stable and increasing. He remains minimally ambulatory.   Objective: Vital signs in last 24 hours: Temp:  [97.9 F (36.6 C)-98.4 F (36.9 C)] 97.9 F (36.6 C) (06/30 0401) Pulse Rate:  [88-100] 100 (06/30 0401) Resp:  [16-18] 18 (06/30 0401) BP: (133-142)/(90-105) 138/105 mmHg (06/30 0401) SpO2:  [96 %-97 %] 96 % (06/30 0401) Last BM Date: 06/28/12  Intake/Output from previous day: 06/29 0701 - 06/30 0700 In: 1475 [P.O.:600; I.V.:875] Out: 1875 [Urine:1875] Intake/Output this shift: Total I/O In: 240 [P.O.:240] Out: 725 [Urine:725]  General appearance: alert, cooperative, appears stated age and Wife at bedside Head: Normocephalic, without obvious abnormality, atraumatic Eyes: conjunctivae/corneas clear. PERRL, EOM's intact. Fundi benign. Ears: normal TM's and external ear canals both ears Nose: Nares normal. Septum midline. Mucosa normal. No drainage or sinus tenderness. Throat: lips, mucosa, and tongue normal; teeth and gums normal Neck: no adenopathy, no carotid bruit, no JVD, supple, symmetrical, trachea midline and thyroid not enlarged, symmetric, no tenderness/mass/nodules  Back: symmetric, no curvature. ROM normal. No CVA tenderness. Resp: clear to auscultation bilaterally Chest wall: no tenderness Cardio: regular rate and  rhythm, S1, S2 normal, no murmur, click, rub or gallop GI: soft, non-tender; bowel sounds normal; no masses,  no organomegaly Male genitalia: normal, stable perineal eccymoses. Extremities: RUE PICC site c/d/i. SCD' sin place Pulses: 2+ and symmetric Skin: Skin color, texture, turgor normal. No rashes or lesions Lymph nodes: Cervical, supraclavicular, and axillary nodes normal. Neurologic: Grossly normal Incision/Wound: Recent lower-midline site and RLQ urostomy c/d/i. Hemorroids improved (less prominent and thrombotic appearing)  Lab Results:   Recent Labs  06/30/12 0350 07/01/12 0455  HGB 11.4* 13.0  HCT 36.0* 40.7   BMET  Recent Labs  06/30/12 0350 07/01/12 0455  NA 137 135  K 3.6 4.2  CL 101 98  CO2 29 30  GLUCOSE 110* 125*  BUN 6 8  CREATININE 0.72 0.86  CALCIUM 8.8 9.3   PT/INR No results found for this basename: LABPROT, INR,  in the last 72 hours ABG No results found for this basename: PHART, PCO2, PO2, HCO3,  in the last 72 hours  Studies/Results: No results found.  Anti-infectives: Anti-infectives   Start     Dose/Rate Route Frequency Ordered Stop   06/25/12 1000  vancomycin (VANCOCIN) 1,250 mg in sodium chloride 0.9 % 250 mL IVPB  Status:  Discontinued     1,250 mg 166.7 mL/hr over 90 Minutes Intravenous Every 12 hours 06/25/12 0856 06/28/12 1102   06/24/12 2000  fluconazole (DIFLUCAN) IVPB 200 mg     200 mg 100 mL/hr over 60 Minutes Intravenous Every 24 hours 06/24/12 1733 06/26/12 2322   06/23/12 0900  vancomycin (VANCOCIN) 1,250 mg in sodium chloride 0.9 % 250 mL IVPB     1,250 mg 166.7 mL/hr over 90 Minutes Intravenous Every 12 hours 06/23/12 0522 06/24/12 2231   06/23/12 0600  piperacillin-tazobactam (ZOSYN) IVPB 3.375 g  Status:  Discontinued     3.375 g 12.5 mL/hr over 240 Minutes Intravenous 3 times per day 06/23/12 0447 06/28/12 1102   06/23/12 0330  cefTRIAXone (ROCEPHIN) 1 g in dextrose 5 % 50 mL IVPB     1 g 100 mL/hr over 30 Minutes  Intravenous  Once 06/23/12 0319 06/23/12 0440      Assessment/Plan:  1 - Bladder Cancer / Cystectomy / Ureteral Revision- NED form cancer perspective.Loopogram without leak this admission. Now on  oxy ER in effort to provide suitable pain control to enable better ambulation which appears to be working.  2 - Fever / h/o Bacteruria - Now off all ABX given negative CX from blood, urine, pelvic fluid.  3 - Heme / Recent DVT + PE -Lovenox now stopped. SCD's in place. STRONGLY encouraged OOB to chair and ambulation. Hgb again stable x 48 hours. PT eval today for return to ambulation and post-op placement recs. Restart proph SQ heparin.  4 - Pelvic Fluid Collection - Imaging and aspirate c/w non-infected hemaotma. Size stable / decreasing as expected. Eplained to pt this will resolve, but slowly (weeks-mos).  5 - Hemorrhoids / Lower GI Bleed - AGree with conservative measures for now. Hgb stable.  6 - Nutrition / Nausea - Appreciate GI help.  7 - Disposition - SNF pending nutritional status.  Greatly appreciate hospitalist, gen surgery, ID, hematology, GI input.  [facility], Chasty Randal Dec 22, 202014

## 2012-07-01 NOTE — Consult Note (Signed)
Patient seen, examined, and I agree with the above documentation, including the assessment and plan. Pt did have SMOG enema with great result, soft/liquid stool and gas External hemorrhoids still very painful. Nausea is intermittent and unpredictable. Agree with scheduled zofran for now. I expect nausea is related to ongoing multiple medical problems (Infection, pain, narcotics). Pt and sister report solid food dysphagia as a separate complaint.  This can be further evaluated with EGD (EGD also will r/o structural cause for nausea). Will re-eval tomorrow.

## 2012-07-01 NOTE — Clinical Social Work Psychosocial (Signed)
     Clinical Social Work Department BRIEF PSYCHOSOCIAL ASSESSMENT 07/01/2012  Patient:  Chad Olson, Chad Olson     Account Number:  [id number]     Admit date:  06/22/2012  Clinical Social Worker:  Hattie Perch  Date/Time:  07/01/2012 12:00 M  Referred by:  Physician  Date Referred:  07/01/2012 Referred for  SNF Placement   Other Referral:   Interview type:  Patient Other interview type:   spouse at bedside    PSYCHOSOCIAL DATA Living Status:  FAMILY Admitted from facility:   Level of care:   Primary support name:  Lenward Chancellor Primary support relationship to patient:  SPOUSE Degree of support available:   good    CURRENT CONCERNS Current Concerns  Post-Acute Placement   Other Concerns:    SOCIAL WORK ASSESSMENT / PLAN CSW met with patient and patient spouse at bedside. patient is alert and oriented X3 but largely defers to his wife. patient in need of snf placement. CSW discussed snf process and [employer] process. spouse is agreeable to patient being faxed out and recieving bed offers.   Assessment/plan status:   Other assessment/ plan:   Information/referral to community resources:    PATIENTS/FAMILYS RESPONSE TO PLAN OF CARE: patient appears disheartened that he has not progressed better and will need snf. however, understands the need.

## 2012-07-01 NOTE — Progress Notes (Signed)
Hb up to 13; started prophylactic dose unfractionated heparin Asymptomatic Lungs clear Abdomen soft, non tender,  No edema, no calf tenderness Impression: #1,Perioperative pelvic hematoma No sign of extension of bleeding Spontaneous rise in Hemoglobin #2. Hx PE/DVT perioperative 1st major surgery 3/14 3 mos full dose anticoagulation completed;  Rec:  D/C anticoagulants at discharge when he is fully ambulatory. Consider 81 mg coated ASA daily

## 2012-07-01 NOTE — Consult Note (Signed)
WOC ostomy consult  Stoma type/location: RLQ Urostomy pouch intact.  Supplies ordered to bedside for future use.  Visit for support for sister and patient.  Wife not in room at this time.  All are well known to me from previous admissions.  Patient is barely interactive despite previous encounters.  I will not follow, but will remain available as needed for this family and the surgical and nursing teams.  Please re-consult if needed. Thanks, Ladona Mow, MSN, RN, [facility], CWOCN [phone])

## 2012-07-01 NOTE — Progress Notes (Signed)
[facility] PROGRESS NOTE  Chad Olson:[id number] DOB: 10/20/1945 DOA: 06/22/2012 PCP: [name], MD  Assessment/Plan:  Constipation/Ileus: No evidence of obstruction on CT scan 6-26.  Continue with supportive care. IV fluids. Limit narcotics. Replete electrolytes. Ambulation. SMOG enema ordered. GI consulted.    Hx of DVT and bilateral PE :  -repeated CT scan hematoma stable.  HB stable at 11. Patient was started on prophylaxis dose heparin.   -Recent Doppler of his lower extremity was negative for DVT.  -Repeated a CT angiogram of the chest which was negative for PE.  -Patient received 3 Month treatment for PE. -Agree with stopping Lovenox.  -Appreciate Dr Cyndie Chime recommendation. No indication for IVC filter.  -SCDs.  -Discharge on low dose aspirin.   Pelvic hematoma: Patient relates worsening abdominal pain. Hematoma stable by CT scan. No orther pathology by CT scan.  Monitor hb on heparin. Abdominal pain probably secondary to hematoma.   Fever with mild leucocytosis : Resolved.  Probably from hematoma. Aspiration negative for bacteria.  Completed treatment with IV vancomycin for MRSA bacteremia. Vancomycin stopped 6-27.  Urine cx growing yeast, fluconazole stopped.  Patient S/P CT guide aspiration of pelvic hematoma 6-26  Anemia  Received 2 units 6-25. Hb stable.  hemorrhoids  General surgery consulted and recommended sitz bath and ice packs for now. continue anusol rectal creams. Hypokalemia  Resolved.  Hypertension  Blood pressure stable. Continue metoprolol  Hyperlipidemia  Continue statin.  bladder cancer: S/P surgery.  Treatment per primary team. Loopogram negative for leak.   Will continue to follow patient.   Procedures:  CT guide aspirate of pelvic hematoma.   Antibiotics:  Vancomycin 6-22  Zosyn 6-22  HPI/Subjective: He vomit yesterday. No worsening pain. Passing gas. No BM yet. He wa able to walk yesterday on the hall.    Objective: Filed Vitals:   06/30/12 0623 06/30/12 1319 06/30/12 2129 07/01/12 0401  BP: 147/91 133/90 142/92 138/105  Pulse: 86 88 98 100  Temp: 98 F (36.7 C) 98 F (36.7 C) 98.4 F (36.9 C) 97.9 F (36.6 C)  TempSrc: Oral Oral Oral Oral  Resp: 16 18 16 18   Height:      Weight:      SpO2: 98% 97% 96% 96%    Intake/Output Summary (Last 24 hours) at 07/01/12 1212 Last data filed at 07/01/12 0600  Gross per 24 hour  Intake   1235 ml  Output   1875 ml  Net   -640 ml   Filed Weights   06/23/12 0405 06/23/12 0449  Weight: 103.2 kg (227 lb 8.2 oz) 100.109 kg (220 lb 11.2 oz)    Exam:   General:  No distress.   Cardiovascular: S 1, S 2 RRR  Respiratory: CTA  Abdomen: BS decrease, soft, NT, incision  healing, distended.   Musculoskeletal: no edema.   Data Reviewed: Basic Metabolic Panel:  Recent Labs Lab 06/27/12 0415 06/28/12 0435 06/29/12 0500 06/30/12 0350 07/01/12 0455  NA 138 138 138 137 135  K 2.9* 3.3* 3.4* 3.6 4.2  CL 104 104 102 101 98  CO2 27 27 28 29 30   GLUCOSE 111* 125* 116* 110* 125*  BUN 6 5* 5* 6 8  CREATININE 0.90 0.82 0.73 0.72 0.86  CALCIUM 8.4 8.6 8.8 8.8 9.3  MG  --   --  2.2  --   --    Liver Function Tests: No results found for this basename: AST, ALT, ALKPHOS, BILITOT, PROT, ALBUMIN,  in the last  168 hours No results found for this basename: LIPASE, AMYLASE,  in the last 168 hours No results found for this basename: AMMONIA,  in the last 168 hours CBC:  Recent Labs Lab 06/25/12 0440  06/26/12 0535 06/27/12 0415 06/28/12 0435 06/29/12 0400 06/30/12 0350 07/01/12 0455  WBC 11.9*  --  11.2* 11.7* 10.6*  --   --   --   HGB 7.0*  < > 7.9* 9.9* 10.9* 11.0* 11.4* 13.0  HCT 21.6*  < > 24.5* 30.6* 33.2* 34.3* 36.0* 40.7  MCV 90.8  --  90.1 90.5 91.7  --   --   --   PLT 233  --  230 281 329  --   --   --   < > = values in this interval not displayed. Cardiac Enzymes: No results found for this basename: CKTOTAL, CKMB,  CKMBINDEX, TROPONINI,  in the last 168 hours BNP (last 3 results) No results found for this basename: PROBNP,  in the last 8760 hours CBG:  Recent Labs Lab 06/30/12 1633 06/30/12 1936 06/30/12 2356 07/01/12 0401 07/01/12 0722  GLUCAP 136* 111* 140* 126* 137*    Recent Results (from the past 240 hour(s))  CULTURE, BLOOD (ROUTINE X 2)     Status: None   Collection Time    06/22/12 10:43 PM      Result Value Range Status   Specimen Description BLOOD LEFT ANTECUBITAL   Final   Special Requests BOTTLES DRAWN AEROBIC AND ANAEROBIC 5CC   Final   Culture  Setup Time 06/23/2012 17:47   Final   Culture NO GROWTH 5 DAYS   Final   Report Status 06/29/2012 FINAL   Final  CULTURE, BLOOD (ROUTINE X 2)     Status: None   Collection Time    06/22/12 10:57 PM      Result Value Range Status   Specimen Description BLOOD LEFT ARM   Final   Special Requests BOTTLES DRAWN AEROBIC AND ANAEROBIC 10CC   Final   Culture  Setup Time 06/23/2012 17:47   Final   Culture NO GROWTH 5 DAYS   Final   Report Status 06/29/2012 FINAL   Final  URINE CULTURE     Status: None   Collection Time    06/23/12  2:17 AM      Result Value Range Status   Specimen Description URINE, CATHETERIZED   Final   Special Requests Normal   Final   Culture  Setup Time 06/23/2012 15:21   Final   Colony Count 35,000 COLONIES/ML   Final   Culture YEAST   Final   Report Status 06/24/2012 FINAL   Final  MRSA PCR SCREENING     Status: None   Collection Time    06/24/12  9:58 AM      Result Value Range Status   MRSA by PCR NEGATIVE  NEGATIVE Final   Comment:            The GeneXpert MRSA Assay (FDA     approved for NASAL specimens     only), is one component of a     comprehensive MRSA colonization     surveillance program. It is not     intended to diagnose MRSA     infection nor to guide or     monitor treatment for     MRSA infections.  CULTURE, ROUTINE-ABSCESS     Status: None   Collection Time    06/27/12 12:51 PM  Result Value Range Status   Specimen Description ABSCESS PELVIC   Final   Special Requests NONE   Final   Gram Stain     Final   Value: MODERATE WBC PRESENT, PREDOMINANTLY PMN     NO SQUAMOUS EPITHELIAL CELLS SEEN     NO ORGANISMS SEEN   Culture NO GROWTH 3 DAYS   Final   Report Status October 02, 202014 FINAL   Final     Studies: No results found.  Scheduled Meds: . acidophilus  1 capsule Oral Daily  . feeding supplement  237 mL Oral BID BM  . feeding supplement  237 mL Oral BID BM  . feeding supplement  1 Container Oral BID BM  . heparin subcutaneous  5,000 Units Subcutaneous Q8H  . hydrocortisone   Rectal TID  . insulin aspart  0-15 Units Subcutaneous Q4H  . metoprolol tartrate  12.5 mg Oral BID  . multivitamin with minerals  1 tablet Oral Daily  . ondansetron  4 mg Intravenous Q4H  . pantoprazole  40 mg Oral Daily  . polyethylene glycol  17 g Oral BID  . simvastatin  10 mg Oral q1800  . sorbitol, milk of mag, mineral oil, glycerin (SMOG) enema  960 mL Rectal Once   Continuous Infusions: . sodium chloride 50 mL/hr at 06/30/12 2028    Principal Problem:   Pelvic hematoma, male Active Problems:   Bladder cancer   Acute pulmonary embolism (3/14)   DVT (deep venous thrombosis) 3/14   Fever   Bacteremia MRSA 5/14   VTE (venous thromboembolism)   Protein-calorie malnutrition, severe   Acute blood loss anemia    Time spent: 25 minutes.     Daley Gosse  [facility] Pager [phone]. If 7PM-7AM, please contact night-coverage at [email or web address], password [facility] 07/01/2012, 12:12 PM  LOS: 9 days

## 2012-07-01 NOTE — Progress Notes (Addendum)
Physical Therapy Treatment Patient Details Name: Chad Olson MRN: [id number] DOB: 07-16-1945 Today's Date: 07/01/2012 Time: 1478-2956 PT Time Calculation (min): 17 min  PT Assessment / Plan / Recommendation  PT Comments   Pt continues to have increased pain and constipation in abdomen during session.  He declined ambulating more than 36' despite PT encouragement for to continue in order to promote increased motility.  BP 134/91 during session   Follow Up Recommendations  SNF     Does the patient have the potential to tolerate intense rehabilitation     Barriers to Discharge        Equipment Recommendations  None recommended by PT    Recommendations for Other Services    Frequency Min 3X/week   Progress towards PT Goals Progress towards PT goals: Progressing toward goals  Plan Current plan remains appropriate    Precautions / Restrictions Precautions Precautions: Fall Restrictions Weight Bearing Restrictions: No   Pertinent Vitals/Pain 7/10 pain in abdomen    Mobility  Bed Mobility Bed Mobility: Supine to Sit Supine to Sit: 3: Mod assist;HOB elevated;With rails Details for Bed Mobility Assistance: Pt requires some assist for LLE and trunk, but he did very well using handrails to self assist.   Transfers Transfers: Sit to Stand;Stand to Sit Sit to Stand: 4: Min assist;With upper extremity assist;From bed Stand to Sit: 4: Min assist;With upper extremity assist;To chair/3-in-1 Details for Transfer Assistance: Assist to rise, steady and ensure controlled descent with cues for hand placement and safety.  Ambulation/Gait Ambulation/Gait Assistance: 4: Min assist Ambulation Distance (Feet): 50 Feet Assistive device: Rolling walker Ambulation/Gait Assistance Details: cues for sequencing/technique with RW (esp maintaining position inside RW with turns), for upright posture and continued breathing throughout.  Gait Pattern: Step-through pattern;Decreased stride length;Wide  base of support;Trunk flexed Gait velocity: decreased    Exercises     PT Diagnosis:    PT Problem List:   PT Treatment Interventions:     PT Goals (current goals can now be found in the care plan section)    Visit Information  Last PT Received On: 07/01/12 Assistance Needed: +2 (for chair follow)    Subjective Data      Cognition  Cognition Arousal/Alertness: Awake/alert Behavior During Therapy: [facility] for tasks assessed/performed Overall Cognitive Status: Within Functional Limits for tasks assessed    Balance     End of Session PT - End of Session Activity Tolerance: Patient limited by fatigue;Patient limited by pain Patient left: in chair;with call bell/phone within reach;with family/visitor present   GP     Vista Deck 07/01/2012, 12:17 PM

## 2012-07-02 ENCOUNTER — Encounter (HOSPITAL_COMMUNITY)
Admission: EM | Disposition: A | Discharge status: Home Health | Payer: Self-pay | Source: Home / Self Care | Attending: Urology

## 2012-07-02 ENCOUNTER — Encounter (HOSPITAL_COMMUNITY): Payer: Self-pay | Admitting: *Deleted

## 2012-07-02 DIAGNOSIS — K219 Gastro-esophageal reflux disease without esophagitis: Secondary | ICD-10-CM

## 2012-07-02 DIAGNOSIS — K3184 Gastroparesis: Secondary | ICD-10-CM

## 2012-07-02 DIAGNOSIS — N1330 Unspecified hydronephrosis: Secondary | ICD-10-CM

## 2012-07-02 HISTORY — PX: ESOPHAGOGASTRODUODENOSCOPY: SHX5428

## 2012-07-02 LAB — GLUCOSE, CAPILLARY
Glucose-Capillary: 104 mg/dL — ABNORMAL HIGH (ref 70–99)
Glucose-Capillary: 120 mg/dL — ABNORMAL HIGH (ref 70–99)
Glucose-Capillary: 129 mg/dL — ABNORMAL HIGH (ref 70–99)
Glucose-Capillary: 129 mg/dL — ABNORMAL HIGH (ref 70–99)

## 2012-07-02 SURGERY — EGD (ESOPHAGOGASTRODUODENOSCOPY)
Anesthesia: Moderate Sedation

## 2012-07-02 MED ORDER — SODIUM CHLORIDE 0.9 % IV SOLN
INTRAVENOUS | Status: DC
  Administered 2012-07-02: 500 mL via INTRAVENOUS

## 2012-07-02 MED ORDER — METOCLOPRAMIDE HCL 5 MG/ML IJ SOLN
10.0000 mg | Freq: Three times a day (TID) | INTRAMUSCULAR | Status: DC
  Administered 2012-07-02 – 2012-07-21 (×72): 10 mg via INTRAVENOUS
  Filled 2012-07-02 (×97): qty 2

## 2012-07-02 MED ORDER — MIDAZOLAM HCL 10 MG/2ML IJ SOLN
INTRAMUSCULAR | Status: AC
  Filled 2012-07-02: qty 2

## 2012-07-02 MED ORDER — MIDAZOLAM HCL 10 MG/2ML IJ SOLN
INTRAMUSCULAR | Status: DC | PRN
  Administered 2012-07-02 (×2): 2 mg via INTRAVENOUS

## 2012-07-02 MED ORDER — BUTAMBEN-TETRACAINE-BENZOCAINE 2-2-14 % EX AERO
INHALATION_SPRAY | CUTANEOUS | Status: DC | PRN
  Administered 2012-07-02: 2 via TOPICAL

## 2012-07-02 MED ORDER — FENTANYL CITRATE 0.05 MG/ML IJ SOLN
INTRAMUSCULAR | Status: DC | PRN
  Administered 2012-07-02 (×3): 25 ug via INTRAVENOUS

## 2012-07-02 MED ORDER — FENTANYL CITRATE 0.05 MG/ML IJ SOLN
INTRAMUSCULAR | Status: AC
  Filled 2012-07-02: qty 2

## 2012-07-02 NOTE — Op Note (Signed)
[facility] [address] [place], [address]   ENDOSCOPY PROCEDURE REPORT  PATIENT: Chad Olson, Chad Olson  MR#: [id number] BIRTHDATE: 1945-07-09 , 67  yrs. old GENDER: Male ENDOSCOPIST: Beverley Fiedler, MD REFERRED BY:   Sebastian Ache, MD PROCEDURE DATE:  07/02/2012 PROCEDURE:  EGD, diagnostic ASA CLASS:     Class III INDICATIONS:  Nausea.   Dysphagia. MEDICATIONS: These medications were titrated to patient response per physician's verbal order, Fentanyl 75 mcg IV, and Versed 4 mg IV TOPICAL ANESTHETIC: Cetacaine Spray  DESCRIPTION OF PROCEDURE: After the risks benefits and alternatives of the procedure were thoroughly explained, informed consent was obtained.  The Pentax Gastroscope [id number] endoscope was introduced through the mouth and advanced to the second portion of the duodenum. Without limitations.  The instrument was slowly withdrawn as the mucosa was fully examined.    ESOPHAGUS: The mucosa of the esophagus appeared normal.   In the distal esophagus there was an area of spasm without mucosal abnormality.  There was mild resistance to passage of the scope past this short segment, but no definitive stricture/ring seen.  STOMACH: Large amount of fluid and food residue was found in the gastric fundus, gastric body, and gastric antrum.  This precluded visualization of the gastric mucosa in these areas. Several small benign-appearing polyps were seen in the gastric body.  DUODENUM: The duodenal mucosa showed no abnormalities in the bulb and second portion of the duodenum.  Retroflexed views revealed obscured by fluid/food residue. The scope was then withdrawn from the patient and the procedure completed.  COMPLICATIONS: There were no complications.  ENDOSCOPIC IMPRESSION: 1.   The mucosa of the esophagus appeared normal; likely esophageal spasm 2.   Fluid/food residue in the gastric fundus, gastric body, and gastric antrum consistent with  gastroparesis (felt, at least in part, to explain nausea) 3.   Several small benign-appearing polyps were seen in the gastric body 4.   The duodenal mucosa showed no abnormalities in the bulb and second portion of the duodenum     RECOMMENDATIONS: 1.  Trial of reglan 10 mg three times daily before meals and at bedtime 2.  Gastroparesis diet 3.  Avoid narcotics when possible due to significant delay in gastric emptying 4.  Repeat EGD for examination of nonvisualized portions of the stomach (as an outpatient)  eSigned:  Beverley Fiedler, MD 07/02/2012 10:12 AM   CC:The Patient  PATIENT NAME:  Chad Olson, Chad Olson MR#: [id number]

## 2012-07-02 NOTE — Progress Notes (Signed)
Checked in with patient to see if he needed someone to talk to. Client stated that he did not need to talk about his experience and that he had had a rough day. He also emphasized that he had been pleased with all of the care he had received at the hospital even as he was frustrated with his illness.  Lenoard Aden [facility] Counseling Intern

## 2012-07-02 NOTE — Progress Notes (Signed)
[facility] PROGRESS NOTE  COYLE STORDAHL BJY:[id number] DOB: [birth year] DOA: 06/22/2012 PCP: [name], MD  Assessment/Plan:  Nausea/Vomiting: Possible gastroparesis. S/P endoscopy 7-1 show: The mucosa of the esophagus appeared normal; likely esophageal  Spasm. Fluid/food residue in the gastric fundus, gastric body, and gastric antrum consistent with gastroparesis (felt, at least in  part, to explain nausea). Several small benign-appearing polyps were seen in the gastric body The duodenal mucosa showed no abnormalities in the bulb and second portion of the duodenum. GI recommend Reglan, limit narcotics.   Constipation/Ileus: No evidence of obstruction on CT scan 6-26.  Continue with supportive care. IV fluids. Limit narcotics. Replete electrolytes. Ambulation. Had one BM today 7-1. GI following.     Hx of DVT and bilateral PE :  -repeated CT scan hematoma stable.  HB stable at 11. Patient was started on prophylaxis dose heparin.   -Recent Doppler of his lower extremity was negative for DVT.  -Repeated a CT angiogram of the chest which was negative for PE.  -Patient received 3 Month treatment for PE. -Agree with stopping Lovenox.  -Appreciate Dr Cyndie Chime recommendation. No indication for IVC filter.  -SCDs.  -Please see Dr Cyndie Chime recommendation for anticoagulation on last Note: 6-30.    Pelvic hematoma: Patient relates worsening abdominal pain. Hematoma stable by CT scan. No orther pathology by CT scan.  Monitor hb on heparin. Abdominal pain probably secondary to hematoma. Repeat Hb 7-2.   Fever with mild leucocytosis : Resolved.  Probably from hematoma. Aspiration negative for bacteria.  Completed treatment with IV vancomycin for MRSA bacteremia. Vancomycin stopped 6-27.  Urine cx growing yeast, fluconazole stopped.  Patient S/P CT guide aspiration of pelvic hematoma 6-26  Anemia  Received 2 units 6-25. Hb stable.  hemorrhoids  General surgery consulted and  recommended sitz bath and ice packs for now. continue anusol rectal creams. Hypokalemia  Resolved.  Hypertension  Blood pressure stable. Continue metoprolol  Hyperlipidemia  Continue statin.  bladder cancer: S/P surgery.  Treatment per primary team. Loopogram negative for leak.   Will continue to follow patient.   Procedures:  CT guide aspirate of pelvic hematoma.   Antibiotics:  Vancomycin 6-22  Zosyn 6-22  HPI/Subjective: Not a good day per wife. Patient had a BM today, a good one.    Objective: Filed Vitals:   07/02/12 1010 07/02/12 1020 07/02/12 1030 07/02/12 1336  BP: 146/105 153/103 144/104 151/89  Pulse:    105  Temp:    98 F (36.7 C)  TempSrc:    Oral  Resp: 19 19 17 18   Height:      Weight:      SpO2: 96% 93% 96% 97%    Intake/Output Summary (Last 24 hours) at 07/02/12 1405 Last data filed at 07/02/12 0625  Gross per 24 hour  Intake 1220.83 ml  Output    825 ml  Net 395.83 ml   Filed Weights   06/23/12 0405 06/23/12 0449  Weight: 103.2 kg (227 lb 8.2 oz) 100.109 kg (220 lb 11.2 oz)    Exam:   General:  No distress.   Cardiovascular: S 1, S 2 RRR  Respiratory: CTA  Abdomen: BS decrease, soft, NT, incision  healing, distended.   Musculoskeletal: no edema.   Data Reviewed: Basic Metabolic Panel:  Recent Labs Lab 06/27/12 0415 06/28/12 0435 06/29/12 0500 06/30/12 0350 07/01/12 0455  NA 138 138 138 137 135  K 2.9* 3.3* 3.4* 3.6 4.2  CL 104 104 102 101 98  CO2  27 27 28 29 30   GLUCOSE 111* 125* 116* 110* 125*  BUN 6 5* 5* 6 8  CREATININE 0.90 0.82 0.73 0.72 0.86  CALCIUM 8.4 8.6 8.8 8.8 9.3  MG  --   --  2.2  --   --    Liver Function Tests: No results found for this basename: AST, ALT, ALKPHOS, BILITOT, PROT, ALBUMIN,  in the last 168 hours No results found for this basename: LIPASE, AMYLASE,  in the last 168 hours No results found for this basename: AMMONIA,  in the last 168 hours CBC:  Recent Labs Lab 06/26/12 0535  06/27/12 0415 06/28/12 0435 06/29/12 0400 06/30/12 0350 07/01/12 0455  WBC 11.2* 11.7* 10.6*  --   --   --   HGB 7.9* 9.9* 10.9* 11.0* 11.4* 13.0  HCT 24.5* 30.6* 33.2* 34.3* 36.0* 40.7  MCV 90.1 90.5 91.7  --   --   --   PLT 230 281 329  --   --   --    Cardiac Enzymes: No results found for this basename: CKTOTAL, CKMB, CKMBINDEX, TROPONINI,  in the last 168 hours BNP (last 3 results) No results found for this basename: PROBNP,  in the last 8760 hours CBG:  Recent Labs Lab 07/01/12 2015 07/01/12 2356 07/02/12 0412 07/02/12 0733 07/02/12 1300  GLUCAP 145* 129* 129* 118* 122*    Recent Results (from the past 240 hour(s))  CULTURE, BLOOD (ROUTINE X 2)     Status: None   Collection Time    06/22/12 10:43 PM      Result Value Range Status   Specimen Description BLOOD LEFT ANTECUBITAL   Final   Special Requests BOTTLES DRAWN AEROBIC AND ANAEROBIC 5CC   Final   Culture  Setup Time 06/23/2012 17:47   Final   Culture NO GROWTH 5 DAYS   Final   Report Status 06/29/2012 FINAL   Final  CULTURE, BLOOD (ROUTINE X 2)     Status: None   Collection Time    06/22/12 10:57 PM      Result Value Range Status   Specimen Description BLOOD LEFT ARM   Final   Special Requests BOTTLES DRAWN AEROBIC AND ANAEROBIC 10CC   Final   Culture  Setup Time 06/23/2012 17:47   Final   Culture NO GROWTH 5 DAYS   Final   Report Status 06/29/2012 FINAL   Final  URINE CULTURE     Status: None   Collection Time    06/23/12  2:17 AM      Result Value Range Status   Specimen Description URINE, CATHETERIZED   Final   Special Requests Normal   Final   Culture  Setup Time 06/23/2012 15:21   Final   Colony Count 35,000 COLONIES/ML   Final   Culture YEAST   Final   Report Status 06/24/2012 FINAL   Final  MRSA PCR SCREENING     Status: None   Collection Time    06/24/12  9:58 AM      Result Value Range Status   MRSA by PCR NEGATIVE  NEGATIVE Final   Comment:            The GeneXpert MRSA Assay (FDA      approved for NASAL specimens     only), is one component of a     comprehensive MRSA colonization     surveillance program. It is not     intended to diagnose MRSA     infection nor  to guide or     monitor treatment for     MRSA infections.  CULTURE, ROUTINE-ABSCESS     Status: None   Collection Time    06/27/12 12:51 PM      Result Value Range Status   Specimen Description ABSCESS PELVIC   Final   Special Requests NONE   Final   Gram Stain     Final   Value: MODERATE WBC PRESENT, PREDOMINANTLY PMN     NO SQUAMOUS EPITHELIAL CELLS SEEN     NO ORGANISMS SEEN   Culture NO GROWTH 3 DAYS   Final   Report Status October 12, 202014 FINAL   Final     Studies: No results found.  Scheduled Meds: . acidophilus  1 capsule Oral Daily  . feeding supplement  237 mL Oral BID BM  . feeding supplement  237 mL Oral BID BM  . feeding supplement  1 Container Oral BID BM  . hydrocortisone   Rectal TID  . insulin aspart  0-15 Units Subcutaneous Q4H  . metoCLOPramide (REGLAN) injection  10 mg Intravenous TID AC & HS  . metoprolol tartrate  12.5 mg Oral BID  . multivitamin with minerals  1 tablet Oral Daily  . ondansetron  4 mg Intravenous Q4H  . pantoprazole  40 mg Oral Daily  . polyethylene glycol  17 g Oral BID  . simvastatin  10 mg Oral q1800   Continuous Infusions: . sodium chloride 50 mL/hr at 06/30/12 2028  . sodium chloride 500 mL (07/02/12 0912)    Principal Problem:   Pelvic hematoma, male Active Problems:   Bladder cancer   Acute pulmonary embolism (3/14)   DVT (deep venous thrombosis) 3/14   Fever   Bacteremia MRSA 5/14   VTE (venous thromboembolism)   Protein-calorie malnutrition, severe   Acute blood loss anemia   Gastroparesis    Time spent: 15 minutes.     REGALADO,BELKYS  [facility] Pager [phone]. If 7PM-7AM, please contact night-coverage at [email or web address], password [facility] 07/02/2012, 2:05 PM  LOS: 10 days

## 2012-07-02 NOTE — Progress Notes (Signed)
PT Cancellation Note  ___Treatment cancelled today due to medical issues with patient which prohibited therapy  ___ Treatment cancelled today due to patient receiving procedure or test   ___ Treatment cancelled today due to patient's refusal to participate   _X_ Treatment cancelled today due to procedure.....Marland KitchenEGD   [name]  PTA [facility]  Acute  Rehab Pager      [phone]

## 2012-07-02 NOTE — Progress Notes (Signed)
Patient with some nausea again this AM.  Says that he would like to do the EGD today.  As per Dr. Lauro Franklin note yesterday, patient had good response to [facility] enema.  Endoscopy team is already here to get the patient.

## 2012-07-02 NOTE — Progress Notes (Signed)
For EGD today to eval nausea and dysphagia. The nature of the procedure, as well as the risks, benefits, and alternatives were carefully and thoroughly reviewed with the patient. Ample time for discussion and questions allowed. The patient understood, was satisfied, and agreed to proceed.

## 2012-07-02 NOTE — Progress Notes (Signed)
CSW provided spouse at bedside with bed offers. Patient resting.  Timmia Cogburn C. Cailee Blanke MSW, LCSW [phone]

## 2012-07-02 NOTE — Progress Notes (Signed)
Visited with pet partner. Patient's brother and other family were in room at time of visit. Patient talked about his dog Rascal and home. Visit cheered him. Presence; support; listening.

## 2012-07-02 NOTE — Progress Notes (Signed)
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 Healed well initially and sent home 6/17. Loopogram 6/27 without internal leak.  2 - Fever / h/o Bacteruria - Pt with MRSA in urine by hospital UCX 03/2012 and again subsequently in blood. Now on Vancomycin daily per ID x 4-6 week total (nearing end of course). Most recent UCX, BCX negative, New set pending from 6/22. Pt readmitted with low-grade fever 6/22 to 100.7 and malaise, no sig leukocytosis. Now on Vanc + Zosyn emirically. UCX with yeast (now getting diflucan x 3 days), BCX no growth to date.Pelvic fluid aspirate and no growth / final.  ID now following,and DC'd all ABX given eval.  3 - Heme / Recent DVT + PE -incidental PE by CT 03/2012 now on Lovenox, DVT resolved by most recent LE duplex. Repeat CT-PE 6/23 without PE or active bleed from pelvis and Lovenox DC'd. Hgb drift to 7 6/24 and transfused 2 u with response to 8, transfused 2u additional 6/25 with increase to 10. Hematology consult [name]) also agrees no treatment dose anticoagulation at this point, but resume proph dosing since he is not ambulating much.  4 - Pelvic Fluid Collection - Pelvic fluid collection without rim-enhancement noted on CT from ER 6/22. Appears partially simple fluid with some dependent blood by HU analysis, no active extrav of blood or urine by imaging. IR aspiration performed 6/26 and fluid c/w hematoma only (not purulent, not simple/drainable).   5 - Hemorrhoids / Lower GI Bleed - Long h/o hemorroids. Pt with worsened / friable bleeding hemorroids on admission that are quite bothersome. No prior  specific intervention, likely exacerbated by pelvic fluid collection / hematoma. Gen Surg recs conservative measures.   6 - Nutrition / Nausea - Pt with some element of baseline GI issues with GERD and piror small esophageal polyp. Has had exacerbation of nausea in house and not meeting nutritional goals. GI consult 6/30 and has changed nausea med regimen to combination of scheduled an prn meds. Excellent response to enema 6/30 with copious stool but nausea persists.  7 - Disposition - PT eval has recommended DC to SNF when appropriate and family amenable.  PMH sig for obesity and left knee replacement, PE + DVT (now resolved). No CV disease.   Today Chad Olson and family are most bothored by persistent nausea w/o emesis. Has EGD pending today Exam c/w large gaseous distension.  Objective: Vital signs in last 24 hours: Temp:  [97.5 F (36.4 C)-98.1 F (36.7 C)] 98.1 F (36.7 C) (07/01 0414) Pulse Rate:  [102-118] 102 (07/01 0414) Resp:  [16-18] 16 (07/01 0414) BP: (126-147)/(94-110) 147/110 mmHg (07/01 0414) SpO2:  [95 %-97 %] 97 % (07/01 0414) Last BM Date: 07/01/12  Intake/Output from previous day: 06/30 0701 - 07/01 0700 In: 1460.8 [P.O.:240; I.V.:1220.8] Out: 1550 [Urine:1550] Intake/Output this shift:    General appearance: alert, cooperative, appears stated age and wife at bedside Head: Normocephalic, without obvious abnormality, atraumatic Eyes: conjunctivae/corneas clear. PERRL, EOM's intact. Fundi benign. Ears: normal TM's and external ear canals both ears Nose: Nares normal. Septum midline. Mucosa normal. No drainage or sinus tenderness. Throat: lips, mucosa, and tongue normal; teeth and gums normal Neck: no adenopathy, no carotid bruit, no JVD, supple, symmetrical, trachea midline and  thyroid not enlarged, symmetric, no tenderness/mass/nodules Back: symmetric, no curvature. ROM normal. No CVA tenderness. Resp: clear to auscultation bilaterally Chest wall: no  tenderness Cardio: regular rate and rhythm, S1, S2 normal, no murmur, click, rub or gallop GI: Distended abdomen wtih gasseous percussion mostly LUQ.  Male genitalia: normal, Stable perineal ecchymoses Extremities: Homans sign is negative, no sign of DVT and RUE PICC c/d/i. SCD's in place. Pulses: 2+ and symmetric Skin: Skin color, texture, turgor normal. No rashes or lesions Lymph nodes: Cervical, supraclavicular, and axillary nodes normal. Neurologic: Grossly normal Incision/Wound: Recent midline incision c/d/i with staples. RLQ Urostomy pink / patent with bander stents in place.  Lab Results:   Recent Labs  06/30/12 0350 07/01/12 0455  HGB 11.4* 13.0  HCT 36.0* 40.7   BMET  Recent Labs  06/30/12 0350 07/01/12 0455  NA 137 135  K 3.6 4.2  CL 101 98  CO2 29 30  GLUCOSE 110* 125*  BUN 6 8  CREATININE 0.72 0.86  CALCIUM 8.8 9.3   PT/INR No results found for this basename: LABPROT, INR,  in the last 72 hours ABG No results found for this basename: PHART, PCO2, PO2, HCO3,  in the last 72 hours  Studies/Results: No results found.  Anti-infectives: Anti-infectives   Start     Dose/Rate Route Frequency Ordered Stop   06/25/12 1000  vancomycin (VANCOCIN) 1,250 mg in sodium chloride 0.9 % 250 mL IVPB  Status:  Discontinued     1,250 mg 166.7 mL/hr over 90 Minutes Intravenous Every 12 hours 06/25/12 0856 06/28/12 1102   06/24/12 2000  fluconazole (DIFLUCAN) IVPB 200 mg     200 mg 100 mL/hr over 60 Minutes Intravenous Every 24 hours 06/24/12 1733 06/26/12 2322   06/23/12 0900  vancomycin (VANCOCIN) 1,250 mg in sodium chloride 0.9 % 250 mL IVPB     1,250 mg 166.7 mL/hr over 90 Minutes Intravenous Every 12 hours 06/23/12 0522 06/24/12 2231   06/23/12 0600  piperacillin-tazobactam (ZOSYN) IVPB 3.375 g  Status:  Discontinued     3.375 g 12.5 mL/hr over 240 Minutes Intravenous 3 times per day 06/23/12 0447 06/28/12 1102   06/23/12 0330  cefTRIAXone (ROCEPHIN) 1 g in  dextrose 5 % 50 mL IVPB     1 g 100 mL/hr over 30 Minutes Intravenous  Once 06/23/12 0319 06/23/12 0440      Assessment/Plan:  1 - Bladder Cancer / Cystectomy / Ureteral Revision- NED form cancer perspective.Loopogram without leak this admission. Now on  oxy ER in effort to provide suitable pain control to enable better ambulation which appears to be working.  2 - Fever / h/o Bacteruria - Now off all ABX given negative CX from blood, urine, pelvic fluid.  3 - Heme / Recent DVT + PE -Lovenox now stopped. SCD's in place. STRONGLY encouraged OOB to chair and ambulation. . PT following. Remain proph SQ heparin + SCD's.  4 - Pelvic Fluid Collection - Imaging and aspirate c/w non-infected hemaotma. Size stable / decreasing as expected. Eplained to pt this will resolve, but slowly (weeks-mos).  5 - Hemorrhoids / Lower GI Bleed - AGree with conservative measures for now. Hgb stable.  6 - Nutrition / Nausea - Now central active issue. No evidence of high-grade obstruction by recent imaging (contrast flows from mouth to rectum) and clinically with preserved flatus and stool. Appreciate GI help and agree with EGD today as his exam is concerning for gastric distension and does have h/o upper GI problems at  baseline (solid dysphagia, polyp, GERD)  7 - Disposition - SNF pending nutritional status.  Greatly appreciate hospitalist, gen surgery, ID, hematology, GI input.  [facility], Teesha Ohm 07/02/2012

## 2012-07-03 ENCOUNTER — Inpatient Hospital Stay (HOSPITAL_COMMUNITY): Payer: BC Managed Care – PPO

## 2012-07-03 ENCOUNTER — Encounter (HOSPITAL_COMMUNITY): Payer: Self-pay | Admitting: Internal Medicine

## 2012-07-03 DIAGNOSIS — K560 Paralytic ileus: Secondary | ICD-10-CM

## 2012-07-03 DIAGNOSIS — R112 Nausea with vomiting, unspecified: Secondary | ICD-10-CM

## 2012-07-03 DIAGNOSIS — I10 Essential (primary) hypertension: Secondary | ICD-10-CM

## 2012-07-03 LAB — GLUCOSE, CAPILLARY
Glucose-Capillary: 132 mg/dL — ABNORMAL HIGH (ref 70–99)
Glucose-Capillary: 107 mg/dL — ABNORMAL HIGH (ref 70–99)
Glucose-Capillary: 97 mg/dL (ref 70–99)
Glucose-Capillary: 106 mg/dL — ABNORMAL HIGH (ref 70–99)

## 2012-07-03 LAB — BASIC METABOLIC PANEL
BUN: 12 mg/dL (ref 6–23)
CO2: 27 mEq/L (ref 19–32)
Calcium: 8.9 mg/dL (ref 8.4–10.5)
Creatinine, Ser: 1.06 mg/dL (ref 0.50–1.35)
GFR calc non Af Amer: 71 mL/min — ABNORMAL LOW (ref >90)
Glucose, Bld: 139 mg/dL — ABNORMAL HIGH (ref 70–99)
Sodium: 136 mEq/L (ref 135–145)

## 2012-07-03 LAB — CBC
MCH: 29.6 pg (ref 26.0–34.0)
MCHC: 32.1 g/dL (ref 30.0–36.0)
MCV: 92.2 fL (ref 78.0–100.0)
Platelets: 429 10*3/uL — ABNORMAL HIGH (ref 150–400)
RBC: 4.22 MIL/uL (ref 4.22–5.81)

## 2012-07-03 LAB — PHOSPHORUS: Phosphorus: 3.5 mg/dL (ref 2.3–4.6)

## 2012-07-03 MED ORDER — SODIUM CHLORIDE 0.9 % IJ SOLN
10.0000 mL | INTRAMUSCULAR | Status: DC | PRN
  Administered 2012-07-04 – 2012-08-08 (×19): 10 mL
  Administered 2012-08-08: 20 mL

## 2012-07-03 MED ORDER — KCL IN DEXTROSE-NACL 20-5-0.45 MEQ/L-%-% IV SOLN
INTRAVENOUS | Status: AC
  Filled 2012-07-03 (×2): qty 1000

## 2012-07-03 MED ORDER — POTASSIUM CHLORIDE 10 MEQ/50ML IV SOLN
10.0000 meq | INTRAVENOUS | Status: AC
  Administered 2012-07-03 (×3): 10 meq via INTRAVENOUS
  Filled 2012-07-03 (×3): qty 50

## 2012-07-03 MED ORDER — TRACE MINERALS CR-CU-F-FE-I-MN-MO-SE-ZN IV SOLN
INTRAVENOUS | Status: AC
  Administered 2012-07-03: 19:00:00 via INTRAVENOUS
  Filled 2012-07-03: qty 1000

## 2012-07-03 MED ORDER — HEPARIN SODIUM (PORCINE) 5000 UNIT/ML IJ SOLN
5000.0000 [IU] | Freq: Three times a day (TID) | INTRAMUSCULAR | Status: AC
  Administered 2012-07-03 – 2012-07-07 (×12): 5000 [IU] via SUBCUTANEOUS
  Filled 2012-07-03 (×12): qty 1

## 2012-07-03 MED ORDER — MORPHINE SULFATE 4 MG/ML IJ SOLN
3.0000 mg | Freq: Four times a day (QID) | INTRAMUSCULAR | Status: DC | PRN
  Administered 2012-07-05: 3 mg via INTRAVENOUS
  Filled 2012-07-03 (×2): qty 1

## 2012-07-03 MED ORDER — FAT EMULSION 20 % IV EMUL
240.0000 mL | INTRAVENOUS | Status: AC
  Administered 2012-07-03: 240 mL via INTRAVENOUS
  Filled 2012-07-03: qty 250

## 2012-07-03 MED ORDER — POTASSIUM CHLORIDE IN NACL 20-0.9 MEQ/L-% IV SOLN
INTRAVENOUS | Status: AC
  Administered 2012-07-03: 21:00:00 via INTRAVENOUS
  Filled 2012-07-03: qty 1000

## 2012-07-03 MED ORDER — METOPROLOL TARTRATE 1 MG/ML IV SOLN
5.0000 mg | Freq: Four times a day (QID) | INTRAVENOUS | Status: DC
  Administered 2012-07-03 – 2012-08-07 (×139): 5 mg via INTRAVENOUS
  Filled 2012-07-03 (×153): qty 5

## 2012-07-03 MED ORDER — POTASSIUM CHLORIDE CRYS ER 20 MEQ PO TBCR
40.0000 meq | EXTENDED_RELEASE_TABLET | Freq: Two times a day (BID) | ORAL | Status: DC
  Filled 2012-07-03 (×2): qty 2
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
## 2012-07-03 NOTE — Progress Notes (Signed)
NUTRITION FOLLOW UP  Intervention:   - TPN per pharmacy - Diet advancement per MD - Provided emotional support to family r/t pt's prolonged hospitalization, noted chaplain has been following pt/family - Will continue to monitor   Nutrition Dx:   Inadequate oral intake related to poor appetite, gas pain, constipation as evidenced by pt consuming only bites of food at mealtimes - ongoing but now as evidenced by NPO.   Goal:   Pt to consume >90% of meals/supplements - not met, pt NPO  New goal: TPN to meet >90% of estimated nutritional needs  Monitor:   Weights, labs, TPN, diet advancement, BM  Assessment:   Pt discussed during multidisciplinary rounds.   S/p EGD 7/1 that showed fluid/food residue in gastric fundus, gastric body, and gastric antrum c/w gastroparesis  Noted pt had large amount of emesis this morning. NGT placed today for ileus, draining greenish/light brown.   Pt to start TPN today for ileus. Pt with significant abdominal distention.   Pt with low potassium today.   Height: Ht Readings from Last 1 Encounters:  06/23/12 5\' 11"  (1.803 m)    Weight Status:   Wt Readings from Last 1 Encounters:  06/23/12 220 lb 11.2 oz (100.109 kg)    Re-estimated needs:  Kcal: 1950-2100  Protein: 80-95g  Fluid: 1.9-2.1L/day   Skin: Mid abdominal incision, right buttocks abrasion, back left skin tear, left buttocks incision   Diet Order: NPO   Intake/Output Summary (Last 24 hours) at 07/03/12 1540 Last data filed at 07/03/12 1351  Gross per 24 hour  Intake 1122.5 ml  Output    650 ml  Net  472.5 ml    Last BM: 6/30   Labs:   Recent Labs Lab 06/29/12 0500 06/30/12 0350 07/01/12 0455 07/03/12 0520  NA 138 137 135 136  K 3.4* 3.6 4.2 3.3*  CL 102 101 98 100  CO2 28 29 30 27   BUN 5* 6 8 12   CREATININE 0.73 0.72 0.86 1.06  CALCIUM 8.8 8.8 9.3 8.9  MG 2.2  --   --  2.3  PHOS  --   --   --  3.5  GLUCOSE 116* 110* 125* 139*    CBG (last 3)    Recent Labs  07/03/12 0416 07/03/12 0732 07/03/12 1136  GLUCAP 120* 132* 107*    Scheduled Meds: . heparin subcutaneous  5,000 Units Subcutaneous Q8H  . hydrocortisone   Rectal TID  . insulin aspart  0-15 Units Subcutaneous Q4H  . metoCLOPramide (REGLAN) injection  10 mg Intravenous TID AC & HS  . metoprolol  5 mg Intravenous Q6H  . ondansetron  4 mg Intravenous Q4H  . potassium chloride  10 mEq Intravenous Q1 Hr x 3    Continuous Infusions: . 0.9 % NaCl with KCl 20 mEq / L    . dextrose 5 % and 0.45 % NaCl with KCl 20 mEq/L    . Marland KitchenTPN (CLINIMIX-E) Adult     And  . fat emulsion       Levon Hedger MS, RD, LDN [phone] Pager [phone] After Hours Pager

## 2012-07-03 NOTE — Progress Notes (Signed)
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 Healed well initially and sent home 6/17. Loopogram 6/27 without internal leak.  2 - Fever / h/o Bacteruria - Pt with MRSA in urine by hospital UCX 03/2012 and again subsequently in blood. Now on Vancomycin daily per ID x 4-6 week total (nearing end of course). Most recent UCX, BCX negative, New set pending from 6/22. Pt readmitted with low-grade fever 6/22 to 100.7 and malaise, no sig leukocytosis. Now on Vanc + Zosyn emirically. UCX with yeast (now getting diflucan x 3 days), BCX no growth to date.Pelvic fluid aspirate and no growth / final.  ID now following,and DC'd all ABX given eval.  3 - Heme / Recent DVT + PE -incidental PE by CT 03/2012 now on Lovenox, DVT resolved by most recent LE duplex. Repeat CT-PE 6/23 without PE or active bleed from pelvis and Lovenox DC'd. Hgb drift to 7 6/24 and transfused 2 u with response to 8, transfused 2u additional 6/25 with increase to 10. Hematology consult [name]) also agrees no treatment dose anticoagulation at this point, but resume proph dosing since he is not ambulating much.  4 - Pelvic Fluid Collection - Pelvic fluid collection without rim-enhancement noted on CT from ER 6/22. Appears partially simple fluid with some dependent blood by HU analysis, no active extrav of blood or urine by imaging. IR aspiration performed 6/26 and fluid c/w hematoma only (not purulent, not simple/drainable).   5 - Hemorrhoids / Lower GI Bleed - Long h/o hemorroids. Pt with worsened / friable bleeding hemorroids on admission that are quite bothersome. No prior  specific intervention, likely exacerbated by pelvic fluid collection / hematoma. Gen Surg recs conservative measures.   6 - Nutrition / Nausea - Pt with some element of baseline GI issues with GERD and prior small esophageal polyp. Has had exacerbation of nausea in house and not meeting nutritional goals. GI now involved and EGD + KUB with ileus / gastroparesis picture. No high-grade mechanical obstruction given continued stool + gas. While ileus resolving placed on TPN and NGT for symptom relief and to bridge him with nutrition.   7 - Disposition - PT eval has recommended DC to SNF when appropriate and family amenable.  PMH sig for obesity and left knee replacement, PE + DVT (now resolved). No CV disease.   Today Chad Olson and family are most bothored by persistent nausea now with emesis.  Objective: Vital signs in last 24 hours: Temp:  [97.9 F (36.6 C)-98.3 F (36.8 C)] 98.1 F (36.7 C) (07/02 1351) Pulse Rate:  [93-106] 93 (07/02 1351) Resp:  [16] 16 (07/02 1351) BP: (150-152)/(87-96) 152/96 mmHg (07/02 1351) SpO2:  [96 %-98 %] 96 % (07/02 1351) Last BM Date: 07/01/12  Intake/Output from previous day: 07/01 0701 - 07/02 0700 In: 822.5 [I.V.:822.5] Out: 750 [Urine:750] Intake/Output this shift: Total I/O In: 300 [NG/GT:300] Out: 300 [Urine:300]  General appearance: alert, cooperative and family at bedside Head: Normocephalic, without obvious abnormality, atraumatic Eyes: conjunctivae/corneas clear. PERRL, EOM's intact. Fundi benign. Ears: normal TM's and external ear canals both ears Nose: Nares normal. Septum midline. Mucosa normal. No drainage or sinus tenderness. Throat: lips, mucosa, and tongue normal; teeth and gums normal Neck: no adenopathy, no carotid bruit, no JVD, supple,  symmetrical, trachea midline and thyroid not enlarged, symmetric, no tenderness/mass/nodules Back: symmetric, no curvature. ROM normal. No CVA tenderness. Resp: clear to auscultation  bilaterally Chest wall: no tenderness Cardio: regular rate and rhythm, S1, S2 normal, no murmur, click, rub or gallop GI: persistant and worsened abd distension with tympanic abdoman. no rebound / guarding. Improved hemorroids. Male genitalia: normal, stable perineal ecchymoses Extremities: extremities normal, atraumatic, no cyanosis or edema and SCD's in place, RUE PICC c/d/i. Pulses: 2+ and symmetric Skin: Skin color, texture, turgor normal. No rashes or lesions Lymph nodes: Cervical, supraclavicular, and axillary nodes normal. Neurologic: Grossly normal Incision/Wound: Recent lower midline incision c/d/i. Urostomy pink / patent with bander stents in place.  Lab Results:   Recent Labs  07/01/12 0455 07/03/12 0520  WBC  --  10.2  HGB 13.0 12.5*  HCT 40.7 38.9*  PLT  --  429*   BMET  Recent Labs  07/01/12 0455 07/03/12 0520  NA 135 136  K 4.2 3.3*  CL 98 100  CO2 30 27  GLUCOSE 125* 139*  BUN 8 12  CREATININE 0.86 1.06  CALCIUM 9.3 8.9   PT/INR No results found for this basename: LABPROT, INR,  in the last 72 hours ABG No results found for this basename: PHART, PCO2, PO2, HCO3,  in the last 72 hours  Studies/Results: Dg Abd 1 View  07/03/2012   *RADIOLOGY REPORT*  Clinical Data: Evaluate NG tube placement  ABDOMEN - 1 VIEW  Comparison: 07/03/2012  Findings: The nasogastric tube tip is in the stomach.  The side port is well below the GE junction.  Abnormal gaseous distention of the small and large bowel loops identified consistent with ileus. There are bilateral Nephro ureteral stents in place.  A percutaneous nephrostomy tube is noted on the left.  IMPRESSION:  1.  Placement of nasogastric tube with side port well below GE junction. 2.  Ileus pattern.   Original Report Authenticated By: Signa Kell, M.D.   Dg Abd 2 Views  07/03/2012   *RADIOLOGY REPORT*  Clinical Data: Distended abdomen with nausea and vomiting.  ABDOMEN - 2 VIEW  Comparison: 06/28/2012.  Findings:  Marked gaseous distention of small bowel and colon with associated air fluid levels.  Bilateral ureteral stents are in place with a presumed right lower quadrant ileal conduit. Percutaneous left nephrostomy as well.  IMPRESSION: Bowel gas pattern is indicative of an ileus.   Original Report Authenticated By: Leanna Battles, M.D.    Anti-infectives: Anti-infectives   Start     Dose/Rate Route Frequency Ordered Stop   06/25/12 1000  vancomycin (VANCOCIN) 1,250 mg in sodium chloride 0.9 % 250 mL IVPB  Status:  Discontinued     1,250 mg 166.7 mL/hr over 90 Minutes Intravenous Every 12 hours 06/25/12 0856 06/28/12 1102   06/24/12 2000  fluconazole (DIFLUCAN) IVPB 200 mg     200 mg 100 mL/hr over 60 Minutes Intravenous Every 24 hours 06/24/12 1733 06/26/12 2322   06/23/12 0900  vancomycin (VANCOCIN) 1,250 mg in sodium chloride 0.9 % 250 mL IVPB     1,250 mg 166.7 mL/hr over 90 Minutes Intravenous Every 12 hours 06/23/12 0522 06/24/12 2231   06/23/12 0600  piperacillin-tazobactam (ZOSYN) IVPB 3.375 g  Status:  Discontinued     3.375 g 12.5 mL/hr over 240 Minutes Intravenous 3 times per day 06/23/12 0447 06/28/12 1102   06/23/12 0330  cefTRIAXone (ROCEPHIN) 1 g in dextrose 5 % 50 mL IVPB     1 g 100  mL/hr over 30 Minutes Intravenous  Once 06/23/12 0319 06/23/12 0440      Assessment/Plan:  1 - Bladder Cancer / Cystectomy / Ureteral Revision- NED form cancer perspective.Loopogram without leak this admission. Now on  oxy ER in effort to provide suitable pain control to enable better ambulation which appears to be working.  2 - Fever / h/o Bacteruria - Now off all ABX given negative CX from blood, urine, pelvic fluid.  3 - Heme / Recent DVT + PE -Lovenox now stopped. SCD's in place. STRONGLY encouraged OOB to chair and ambulation. . PT following. Remain proph SQ heparin + SCD's.  4 - Pelvic Fluid Collection - Imaging and aspirate c/w non-infected hemaotma. Size stable / decreasing as expected.  Eplained to pt this will resolve, but slowly (weeks-mos).  5 - Hemorrhoids / Lower GI Bleed - AGree with conservative measures for now. Hgb stable.  6 - Nutrition / Nausea - Now central active issue. No evidence of high-grade obstruction by recent imaging (contrast flows from mouth to rectum) and clinically with preserved flatus and stool. Agree with prokinetic with reglan as well as NGT for symptom improvement and TPN / PICC change to help get nutrition.  7 - Disposition - SNF pending nutritional status.  Greatly appreciate hospitalist, gen surgery, ID, hematology, GI input.  [facility], Andretta Ergle 07/03/2012

## 2012-07-03 NOTE — Progress Notes (Signed)
Peripherally Inserted Central Catheter/Midline Placement  The IV Nurse has discussed with the patient and/or persons authorized to consent for the patient, the purpose of this procedure and the potential benefits and risks involved with this procedure.  The benefits include less needle sticks, lab draws from the catheter and patient may be discharged home with the catheter.  Risks include, but not limited to, infection, bleeding, blood clot (thrombus formation), and puncture of an artery; nerve damage and irregular heat beat.  Alternatives to this procedure were also discussed.  PICC/Midline Placement Documentation        Vevelyn Pat 07/03/2012, 6:44 PM

## 2012-07-03 NOTE — Progress Notes (Signed)
Sister at the bedside--- patient vomited large amount of brownish orange emesis in basin, pt had cup of orange juice became nauseated. Patient's sister flushed before recorded. Phenergan given SRP, RN

## 2012-07-03 NOTE — Progress Notes (Signed)
Patient seen, examined, and I agree with the above documentation, including the assessment and plan. Abd XR done today consistent with ileus.  Medical picture also consistent with ileus likely secondary to acute medical illness, hospitalization, narcotic use. NGT has been placed for decompression.  To this point 300 cc green fluid aspirated. Still uncomfortable.  Abd distended and bowel sounds hypoactive No evidence for obstruction. Would continue Reglan every 6 hours for now. Limit narcotics. Closely monitor electrolytes and ensure normal, goal potassium greater than 4 TPN started by primary team which should help nutritionally I discussed ileus with the patient and his wife today. This can be a frustrating problem and can be slow to improve. We will monitor NG output to determine when we can try by mouth fluids/food again Follow abd xray

## 2012-07-03 NOTE — Progress Notes (Signed)
CSW met with family. Discussed bed offers. They are requesting [facility].  Allyson Tineo C. Azahel Belcastro MSW, LCSW [phone]

## 2012-07-03 NOTE — Progress Notes (Signed)
[facility] PROGRESS NOTE  JAHVIER ALDEA ZOX:[id number] DOB: 06/29/1945 DOA: 06/22/2012 PCP: [name], MD  Assessment/Plan: Nausea/Vomiting: Possible gastroparesis. S/P endoscopy 7-1 show: The mucosa of the esophagus appeared normal; likely esophageal Spasm. Fluid/food residue in the gastric fundus, gastric body, and gastric antrum consistent with gastroparesis (felt, at least in  part, to explain nausea).  -GI recommend Reglan, limit narcotics.  -continue antiemetics -further approach as mentioned below (place NGT, NPO, transition meds to IV)  Gastroparesis/iIleus/poor nutrition: No evidence of obstruction on CT scan bd x-ray on 7/2. -given ongoing symptoms (N/V, abd pain and distension), will change PO meds to IV -will make NPO -will start NGT -continue reglan -will start TNA -Replete electrolytes  -nausea/vomiting as mentioned above.  Hx of DVT and bilateral PE :  -repeated CT scan hematoma stable. HB stable at 11. Patient was started on prophylaxis dose heparin.  -Recent Doppler of his lower extremity was negative for DVT.  -Repeated a CT angiogram of the chest which was negative for PE.  -Patient received 3 Month treatment for PE.  -Appreciate Dr Cyndie Chime recommendation. No indication for IVC filter.  -will continue heparin for DVT prophylaxis  Pelvic hematoma: Patient relates worsening abdominal pain. Hematoma stable by CT scan. No orther pathology by CT scan. Monitor hb on heparin. Abdominal pain multifactorial, including hematoma.  Fever with mild leucocytosis : Resolved.  Pelvic fluid collection Aspiration negative for bacteria.  Completed treatment with IV vancomycin for MRSA bacteremia. (Vancomycin stopped 6-27).  Urine cx growing yeast, fluconazole stopped after 3 days tx.  Further recommendations per ID  Anemia  Received a total of 4 units PRBC during this admission. Hb stable.   hemorrhoids  -General surgery consulted and recommended sitz bath and  ice packs for now.  -continue anusol rectal creams.   Hypokalemia  Will replete as needed and will try to maintain above 4  Hypertension  Blood pressure stable. Will continue metoprolol, but will change to IV  Hyperlipidemia  Continue statin when tolerating PO   bladder cancer: S/P surgery.  Treatment per primary team.  Loopogram negative for leak.      Code Status: Full Family Communication: wife and sister at bedside Disposition Plan: SNF at discharge  Antibiotics:  None currently  HPI/Subjective: Afebrile, no CP, no SOB. Complaining of abd pain, abd distension and N/V.  Objective: Filed Vitals:   07/02/12 1030 07/02/12 1336 07/02/12 2131 07/03/12 0454  BP: 144/104 151/89 150/88 151/87  Pulse:  105 106 100  Temp:  98 F (36.7 C) 98.3 F (36.8 C) 97.9 F (36.6 C)  TempSrc:  Oral Oral Oral  Resp: 17 18 16 16   Height:      Weight:      SpO2: 96% 97% 97% 98%    Intake/Output Summary (Last 24 hours) at 07/03/12 1149 Last data filed at 07/03/12 0455  Gross per 24 hour  Intake  822.5 ml  Output    750 ml  Net   72.5 ml   Filed Weights   06/23/12 0405 06/23/12 0449  Weight: 103.2 kg (227 lb 8.2 oz) 100.109 kg (220 lb 11.2 oz)    Exam:   General:  Afebrile, complaining of abd pain and N?V  Cardiovascular: S1 and S2, no rubs or gallops  Respiratory: CTA bilaterally  Abdomen: distended, urostomy bag in place, mid surgical wound w/o signs or infection; tender to palpation; decrease BS  Musculoskeletal: trace edema bilaterally  Data Reviewed: Basic Metabolic Panel:  Recent Labs Lab 06/28/12 0435  06/29/12 0500 06/30/12 0350 07/01/12 0455 07/03/12 0520  NA 138 138 137 135 136  K 3.3* 3.4* 3.6 4.2 3.3*  CL 104 102 101 98 100  CO2 27 28 29 30 27   GLUCOSE 125* 116* 110* 125* 139*  BUN 5* 5* 6 8 12   CREATININE 0.82 0.73 0.72 0.86 1.06  CALCIUM 8.6 8.8 8.8 9.3 8.9  MG  --  2.2  --   --   --    CBC:  Recent Labs Lab 06/27/12 0415  06/28/12 0435 06/29/12 0400 06/30/12 0350 07/01/12 0455 07/03/12 0520  WBC 11.7* 10.6*  --   --   --  10.2  HGB 9.9* 10.9* 11.0* 11.4* 13.0 12.5*  HCT 30.6* 33.2* 34.3* 36.0* 40.7 38.9*  MCV 90.5 91.7  --   --   --  92.2  PLT 281 329  --   --   --  429*   CBG:  Recent Labs Lab 07/02/12 2003 07/02/12 2338 07/03/12 0416 07/03/12 0732 07/03/12 1136  GLUCAP 104* 159* 120* 132* 107*    Recent Results (from the past 240 hour(s))  MRSA PCR SCREENING     Status: None   Collection Time    06/24/12  9:58 AM      Result Value Range Status   MRSA by PCR NEGATIVE  NEGATIVE Final   Comment:            The GeneXpert MRSA Assay (FDA     approved for NASAL specimens     only), is one component of a     comprehensive MRSA colonization     surveillance program. It is not     intended to diagnose MRSA     infection nor to guide or     monitor treatment for     MRSA infections.  CULTURE, ROUTINE-ABSCESS     Status: None   Collection Time    06/27/12 12:51 PM      Result Value Range Status   Specimen Description ABSCESS PELVIC   Final   Special Requests NONE   Final   Gram Stain     Final   Value: MODERATE WBC PRESENT, PREDOMINANTLY PMN     NO SQUAMOUS EPITHELIAL CELLS SEEN     NO ORGANISMS SEEN   Culture NO GROWTH 3 DAYS   Final   Report Status 29-Jul-202014 FINAL   Final     Studies: Dg Abd 2 Views  07/03/2012   *RADIOLOGY REPORT*  Clinical Data: Distended abdomen with nausea and vomiting.  ABDOMEN - 2 VIEW  Comparison: 06/28/2012.  Findings: Marked gaseous distention of small bowel and colon with associated air fluid levels.  Bilateral ureteral stents are in place with a presumed right lower quadrant ileal conduit. Percutaneous left nephrostomy as well.  IMPRESSION: Bowel gas pattern is indicative of an ileus.   Original Report Authenticated By: Leanna Battles, M.D.    Scheduled Meds: . heparin subcutaneous  5,000 Units Subcutaneous Q8H  . hydrocortisone   Rectal TID  . insulin  aspart  0-15 Units Subcutaneous Q4H  . metoCLOPramide (REGLAN) injection  10 mg Intravenous TID AC & HS  . metoprolol  5 mg Intravenous Q6H  . ondansetron  4 mg Intravenous Q4H  . potassium chloride  10 mEq Intravenous Q1 Hr x 3   Continuous Infusions: . dextrose 5 % and 0.45 % NaCl with KCl 20 mEq/L      Principal Problem:   Pelvic hematoma, male Active Problems:   Bladder  cancer   Acute pulmonary embolism (3/14)   DVT (deep venous thrombosis) 3/14   Fever   Bacteremia MRSA 5/14   VTE (venous thromboembolism)   Protein-calorie malnutrition, severe   Acute blood loss anemia   Gastroparesis    Time spent: >30 minutes    Rhonin Trott  [facility] Pager [phone]. If 7PM-7AM, please contact night-coverage at [email or web address], password [facility] 07/03/2012, 11:49 AM  LOS: 11 days

## 2012-07-03 NOTE — Progress Notes (Signed)
PT Cancellation Note  Patient Details Name: Chad Olson MRN: [id number] DOB: [birth year]   Cancelled Treatment:    Reason Eval/Treat Not Completed: Medical issues which prohibited therapy. Pt with nausea/vomiting this morning. Will follow.    Ralene Bathe Kistler 07/03/2012, 9:48 AM [phone]

## 2012-07-03 NOTE — Progress Notes (Signed)
[facility] Progress Note  Subjective:  Still with nausea.  Vomited after drinking some orange juice this AM.  Having small loose/soft stools.  Getting morphine a couple of times per day.  Objective:  Vital signs in last 24 hours: Temp:  [97.9 F (36.6 C)-98.3 F (36.8 C)] 97.9 F (36.6 C) (07/02 0454) Pulse Rate:  [86-106] 100 (07/02 0454) Resp:  [13-23] 16 (07/02 0454) BP: (144-186)/(80-105) 151/87 mmHg (07/02 0454) SpO2:  [92 %-100 %] 98 % (07/02 0454) Last BM Date: 07/01/12 General:   Alert, Well-developed, in NAD Heart:  Regular rate and rhythm; no murmurs Pulm:  CTAB.  No W/R/R. Abdomen:  Soft, distended.  BS present and somewhat high-pitched, tinkling.  Diffuse TTP without R/R/G.   Extremities:  Without edema. Neurologic:  Alert and  oriented x4;  grossly normal neurologically. Psych:  Alert and cooperative. Normal mood and affect.  Intake/Output from previous day: 07/01 0701 - 07/02 0700 In: 822.5 [I.V.:822.5] Out: 750 [Urine:750]  Lab Results:  Recent Labs  07/01/12 0455 07/03/12 0520  WBC  --  10.2  HGB 13.0 12.5*  HCT 40.7 38.9*  PLT  --  429*   BMET  Recent Labs  07/01/12 0455 07/03/12 0520  NA 135 136  K 4.2 3.3*  CL 98 100  CO2 30 27  GLUCOSE 125* 139*  BUN 8 12  CREATININE 0.86 1.06  CALCIUM 9.3 8.9   Assessment / Plan: -Gastroparesis:  Solid food particles and liquids seen in stomach during EGD 7/1.  Placed on Reglan 10 mg ACHS yesterday. -Nausea/vomiting, anorexia: ? A result of and secondary to issues stated below. Narcotics can contribute to nausea as well as gastroparesis.  -Constipation with mass effect on rectum from hematoma. Narcotics also likely contributing to constipation.  Had good result from [facility] enema on 7/31 and still passing small amounts of soft/liquid stool.  -Pelvic hematoma measuring 20 x 8.5 cm  -Bladder cancer with recent surgeries, cystectomy and ureteral revisions  -DVT/PE 03/2012: Was on Lovenox, which  has been discontinued due to hematoma listed above.  On prophylactic heparin for now.  -Intermittent dysphagia: EGD revealed normal esophagus.  -Continue ATC zofran and prn phenergan. -Continue Reglan 10 mg IV ACHS (just started this yesterday and need to give it time to work). -Will check abdominal films to rule out any new SBO, etc. -Need to limit narcotics as this will definitely worsen his gastroparesis. -If he continues without nutrition, then he may need other means (PICC for TNA vs ? J-Tube), however, with history of MRSA bacteremia, PICC line would not be a great option. -Continue current bowel regimen. -Continue liquid supplements such as Nurse, adult and Glucerna shakes.    LOS: 11 days   ZEHR, JESSICA D.  07/03/2012, 9:11 AM  Pager number [phone]

## 2012-07-04 LAB — GLUCOSE, CAPILLARY: Glucose-Capillary: 121 mg/dL — ABNORMAL HIGH (ref 70–99)

## 2012-07-04 LAB — COMPREHENSIVE METABOLIC PANEL
AST: 12 U/L (ref 0–37)
Alkaline Phosphatase: 66 U/L (ref 39–117)
BUN: 13 mg/dL (ref 6–23)
CO2: 28 mEq/L (ref 19–32)
Chloride: 101 mEq/L (ref 96–112)
Creatinine, Ser: 0.96 mg/dL (ref 0.50–1.35)
GFR calc non Af Amer: 84 mL/min — ABNORMAL LOW (ref >90)
Potassium: 2.8 mEq/L — ABNORMAL LOW (ref 3.5–5.1)
Total Bilirubin: 0.8 mg/dL (ref 0.3–1.2)

## 2012-07-04 LAB — PHOSPHORUS: Phosphorus: 3.1 mg/dL (ref 2.3–4.6)

## 2012-07-04 MED ORDER — POTASSIUM CHLORIDE 10 MEQ/50ML IV SOLN
10.0000 meq | INTRAVENOUS | Status: AC
  Administered 2012-07-04 (×4): 10 meq via INTRAVENOUS
  Filled 2012-07-04 (×4): qty 50

## 2012-07-04 MED ORDER — FAT EMULSION 20 % IV EMUL
240.0000 mL | INTRAVENOUS | Status: AC
  Administered 2012-07-04: 240 mL via INTRAVENOUS
  Filled 2012-07-04: qty 250

## 2012-07-04 MED ORDER — POTASSIUM CHLORIDE IN NACL 20-0.9 MEQ/L-% IV SOLN
INTRAVENOUS | Status: DC
  Administered 2012-07-05: 20 mL/h via INTRAVENOUS
  Administered 2012-07-08 – 2012-07-11 (×5): via INTRAVENOUS
  Administered 2012-07-12: 1000 mL via INTRAVENOUS
  Administered 2012-07-14 (×2): via INTRAVENOUS
  Filled 2012-07-04 (×14): qty 1000

## 2012-07-04 MED ORDER — POTASSIUM CHLORIDE 10 MEQ/100ML IV SOLN
10.0000 meq | INTRAVENOUS | Status: AC
  Administered 2012-07-04 (×6): 10 meq via INTRAVENOUS
  Filled 2012-07-04 (×6): qty 100

## 2012-07-04 MED ORDER — TRACE MINERALS CR-CU-F-FE-I-MN-MO-SE-ZN IV SOLN
INTRAVENOUS | Status: AC
  Administered 2012-07-04: 18:00:00 via INTRAVENOUS
  Filled 2012-07-04: qty 2000
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
## 2012-07-04 NOTE — Progress Notes (Signed)
Report received from Tess Blaine,RN. No change in assessment, continue current plan of care. Chad Olson 

## 2012-07-04 NOTE — Progress Notes (Signed)
Patient had large amount of NGT drainage during the the shift. Patient NGT drained 1100 cc of golden yellow drainage during the shift. Patient nauseated at times. SRP, RN

## 2012-07-04 NOTE — Progress Notes (Addendum)
[facility] PROGRESS NOTE  Chad Olson ZOX:[id number] DOB: 1945-08-03 DOA: 06/22/2012 PCP: [name], MD  Assessment/Plan: Nausea/Vomiting: Possible gastroparesis. S/P endoscopy 7-1 show: The mucosa of the esophagus appeared normal; likely esophageal Spasm. Fluid/food residue in the gastric fundus, gastric body, and gastric antrum consistent with gastroparesis (felt, at least in  part, to explain nausea).  -GI recommend Reglan, limit narcotics.  -continue antiemetics -further approach as mentioned below (place NGT, NPO, transition meds to IV)  Gastroparesis/iIleus/poor nutrition: No evidence of obstruction on CT scan bd x-ray on 7/2. -given ongoing symptoms (N/V, abd pain and distension), will change PO meds to IV -continue NPO status -continue NGT -continue reglan -will continue TNA for nutrition bridging -Replete electrolytes  -nausea/vomiting as mentioned above. -x-ray in am to follow ileus  Hx of DVT and bilateral PE :  -repeated CT scan hematoma stable. HB stable at 11. Patient was started on prophylaxis dose heparin.  -Recent Doppler of his lower extremity was negative for DVT.  -Repeated a CT angiogram of the chest which was negative for PE.  -Patient received 3 Month treatment for PE.  -Appreciate Dr Cyndie Chime recommendation. No indication for IVC filter.  -will continue heparin for DVT prophylaxis  Pelvic hematoma: Patient relates worsening abdominal pain. Hematoma stable by CT scan. No orther pathology by CT scan. Monitor hb on heparin. Abdominal pain multifactorial, including hematoma.  Fever with mild leucocytosis: Resolved.  Pelvic fluid collection Aspiration negative for bacteria.  Completed treatment with IV vancomycin for MRSA bacteremia. (Vancomycin stopped 6-27).  Urine cx growing yeast, fluconazole stopped after 3 days tx.  Further recommendations per ID.  Anemia  Received a total of 4 units PRBC during this admission. Hb stable, will  monitor  hemorrhoids  -General surgery consulted and recommended sitz bath and ice packs for now.  -continue anusol rectal creams.   Hypokalemia  -Will replete as needed and will try to maintain above 4 -some potassium also given with TNA -due GI losses   Hypertension  Blood pressure stable. Will continue metoprolol IV  Hyperlipidemia  Continue holding statin for now and will resume when tolerating PO   bladder cancer: S/P surgery.  Treatment per primary team.  Loopogram negative for leak.    Code Status: Full Family Communication: wife and sister at bedside Disposition Plan: SNF at discharge  Antibiotics:  None currently  HPI/Subjective: Afebrile, no CP, no SOB. Still omplaining of abd pain and nausea; no further vomiting. Distension improved after NGT placed.  Objective: Filed Vitals:   07/03/12 1351 07/03/12 2009 07/04/12 0451 07/04/12 1320  BP: 152/96 131/76 136/76 148/96  Pulse: 93 90 89 84  Temp: 98.1 F (36.7 C) 97.4 F (36.3 C) 97.8 F (36.6 C) 97.7 F (36.5 C)  TempSrc: Axillary Oral Oral Oral  Resp: 16 16 16 20   Height:      Weight:      SpO2: 96% 98% 97% 96%    Intake/Output Summary (Last 24 hours) at 07/04/12 1531 Last data filed at 07/04/12 1323  Gross per 24 hour  Intake 694.34 ml  Output   2875 ml  Net -2180.66 ml   Filed Weights   06/23/12 0405 06/23/12 0449  Weight: 103.2 kg (227 lb 8.2 oz) 100.109 kg (220 lb 11.2 oz)    Exam:   General:  Afebrile, complaining of abd pain and Nausea; no more vomiting  Cardiovascular: S1 and S2, no rubs or gallops  Respiratory: CTA bilaterally  Abdomen: softer, less distended, urostomy bag in place,  mid surgical wound w/o signs or infection; tender to palpation; positive BS  Musculoskeletal: trace edema bilaterally  Data Reviewed: Basic Metabolic Panel:  Recent Labs Lab 06/29/12 0500 06/30/12 0350 07/01/12 0455 07/03/12 0520 07/04/12 0400  NA 138 137 135 136 137  K 3.4* 3.6 4.2 3.3*  2.8*  CL 102 101 98 100 101  CO2 28 29 30 27 28   GLUCOSE 116* 110* 125* 139* 134*  BUN 5* 6 8 12 13   CREATININE 0.73 0.72 0.86 1.06 0.96  CALCIUM 8.8 8.8 9.3 8.9 8.6  MG 2.2  --   --  2.3 2.4  PHOS  --   --   --  3.5 3.1   CBC:  Recent Labs Lab 06/28/12 0435 06/29/12 0400 06/30/12 0350 07/01/12 0455 07/03/12 0520  WBC 10.6*  --   --   --  10.2  HGB 10.9* 11.0* 11.4* 13.0 12.5*  HCT 33.2* 34.3* 36.0* 40.7 38.9*  MCV 91.7  --   --   --  92.2  PLT 329  --   --   --  429*   CBG:  Recent Labs Lab 07/03/12 2006 07/03/12 2350 07/04/12 0450 07/04/12 0727 07/04/12 1152  GLUCAP 106* 105* 120* 131* 118*    Recent Results (from the past 240 hour(s))  CULTURE, ROUTINE-ABSCESS     Status: None   Collection Time    06/27/12 12:51 PM      Result Value Range Status   Specimen Description ABSCESS PELVIC   Final   Special Requests NONE   Final   Gram Stain     Final   Value: MODERATE WBC PRESENT, PREDOMINANTLY PMN     NO SQUAMOUS EPITHELIAL CELLS SEEN     NO ORGANISMS SEEN   Culture NO GROWTH 3 DAYS   Final   Report Status 10/04/2012 FINAL   Final     Studies: Dg Abd 1 View  07/03/2012   *RADIOLOGY REPORT*  Clinical Data: Evaluate NG tube placement  ABDOMEN - 1 VIEW  Comparison: 07/03/2012  Findings: The nasogastric tube tip is in the stomach.  The side port is well below the GE junction.  Abnormal gaseous distention of the small and large bowel loops identified consistent with ileus. There are bilateral Nephro ureteral stents in place.  A percutaneous nephrostomy tube is noted on the left.  IMPRESSION:  1.  Placement of nasogastric tube with side port well below GE junction. 2.  Ileus pattern.   Original Report Authenticated By: Signa Kell, M.D.   Dg Abd 2 Views  07/03/2012   *RADIOLOGY REPORT*  Clinical Data: Distended abdomen with nausea and vomiting.  ABDOMEN - 2 VIEW  Comparison: 06/28/2012.  Findings: Marked gaseous distention of small bowel and colon with associated air  fluid levels.  Bilateral ureteral stents are in place with a presumed right lower quadrant ileal conduit. Percutaneous left nephrostomy as well.  IMPRESSION: Bowel gas pattern is indicative of an ileus.   Original Report Authenticated By: Leanna Battles, M.D.    Scheduled Meds: . heparin subcutaneous  5,000 Units Subcutaneous Q8H  . hydrocortisone   Rectal TID  . insulin aspart  0-15 Units Subcutaneous Q4H  . metoCLOPramide (REGLAN) injection  10 mg Intravenous TID AC & HS  . metoprolol  5 mg Intravenous Q6H  . ondansetron  4 mg Intravenous Q4H  . potassium chloride  10 mEq Intravenous Q1 Hr x 4   Continuous Infusions: . 0.9 % NaCl with KCl 20 mEq / L 35  mL/hr at 07/03/12 2124  . 0.9 % NaCl with KCl 20 mEq / L    . Marland KitchenTPN (CLINIMIX-E) Adult 40 mL/hr at 07/03/12 1834   And  . fat emulsion 240 mL (07/03/12 1834)  . Marland KitchenTPN (CLINIMIX-E) Adult     And  . fat emulsion      Principal Problem:   Pelvic hematoma, male Active Problems:   Bladder cancer   Acute pulmonary embolism (3/14)   DVT (deep venous thrombosis) 3/14   Fever   Bacteremia MRSA 5/14   VTE (venous thromboembolism)   Protein-calorie malnutrition, severe   Acute blood loss anemia   Gastroparesis    Time spent: < 30 minutes   Chad Olson  [facility] Pager [phone]. If 7PM-7AM, please contact night-coverage at [email or web address], password [facility] 07/04/2012, 3:31 PM  LOS: 12 days

## 2012-07-04 NOTE — Progress Notes (Signed)
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 Healed well initially and sent home 6/17. Loopogram 6/27 without internal leak.  2 - Fever / h/o Bacteruria - Pt with MRSA in urine by hospital UCX 03/2012 and again subsequently in blood. Now on Vancomycin daily per ID x 4-6 week total (nearing end of course). Most recent UCX, BCX negative, New set pending from 6/22. Pt readmitted with low-grade fever 6/22 to 100.7 and malaise, no sig leukocytosis. Now on Vanc + Zosyn emirically. UCX with yeast (now getting diflucan x 3 days), BCX no growth to date.Pelvic fluid aspirate and no growth / final.  ID now following,and DC'd all ABX given eval.  3 - Heme / Recent DVT + PE -incidental PE by CT 03/2012 now on Lovenox, DVT resolved by most recent LE duplex. Repeat CT-PE 6/23 without PE or active bleed from pelvis and Lovenox DC'd. Hgb drift to 7 6/24 and transfused 2 u with response to 8, transfused 2u additional 6/25 with increase to 10. Hematology consult [name]) also agrees no treatment dose anticoagulation at this point, but resume proph dosing since he is not ambulating much.  4 - Pelvic Fluid Collection - Pelvic fluid collection without rim-enhancement noted on CT from ER 6/22. Appears partially simple fluid with some dependent blood by HU analysis, no active extrav of blood or urine by imaging. IR aspiration performed 6/26 and fluid c/w hematoma only (not purulent, not simple/drainable).   5 - Hemorrhoids / Lower GI Bleed - Long h/o hemorroids. Pt with worsened / friable bleeding hemorroids on admission that are quite bothersome. No prior  specific intervention, likely exacerbated by pelvic fluid collection / hematoma. Gen Surg recs conservative measures.   6 - Nutrition / Nausea - Pt with some element of baseline GI issues with GERD and prior small esophageal polyp. Has had exacerbation of nausea in house and not meeting nutritional goals. GI now involved and EGD + KUB with ileus / gastroparesis picture. No high-grade mechanical obstruction given continued stool + gas. While ileus resolving placed on TPN and NGT for symptom relief and to bridge him with nutrition.   7 - Disposition - PT eval has recommended DC to SNF when appropriate and family amenable.  PMH sig for obesity and left knee replacement, PE + DVT (now resolved). No CV disease.   Today Chad Olson is feeling somewhat stronger. NGT has helped some with nausea and remains high-output (1L overnight)    Objective: Vital signs in last 24 hours: Temp:  [97.4 F (36.3 C)-98.1 F (36.7 C)] 97.8 F (36.6 C) (07/03 0451) Pulse Rate:  [89-93] 89 (07/03 0451) Resp:  [16] 16 (07/03 0451) BP: (131-152)/(76-96) 136/76 mmHg (07/03 0451) SpO2:  [96 %-98 %] 97 % (07/03 0451) Last BM Date: 07/01/12  Intake/Output from previous day: 07/02 0701 - 07/03 0700 In: 694.3 [I.V.:336; NG/GT:90; TPN:268.3] Out: 3000 [Urine:1300; Emesis/NG output:1700] Intake/Output this shift:    General appearance: alert, cooperative, appears stated age and family at bedside Head: Normocephalic, without obvious abnormality, atraumatic Eyes: conjunctivae/corneas clear. PERRL, EOM's intact. Fundi benign. Ears: normal TM's and external ear canals both ears Nose: Nares normal. Septum midline. Mucosa normal. No drainage or sinus tenderness. Throat: lips, mucosa, and tongue normal; teeth and gums normal and  NGT in place wtih bilious output Neck: no adenopathy, no carotid bruit, no JVD, supple, symmetrical, trachea midline and thyroid not enlarged, symmetric, no tenderness/mass/nodules Back: symmetric, no  curvature. ROM normal. No CVA tenderness. Resp: clear to auscultation bilaterally Chest wall: no tenderness Cardio: regular rate and rhythm, S1, S2 normal, no murmur, click, rub or gallop GI: Decreased, but persistent abd distension.  Male genitalia: normal, stable perineal ecchymoses Extremities: extremities normal, atraumatic, no cyanosis or edema and SCD's in place. RUE PICC remains c/d/i after change yesterday. Pulses: 2+ and symmetric Skin: Skin color, texture, turgor normal. No rashes or lesions Lymph nodes: Cervical, supraclavicular, and axillary nodes normal. Neurologic: Grossly normal Incision/Wound: RLQ urostomy pink / patent. Lower midline incision site c/d/i with staples in place.   Lab Results:   Recent Labs  07/03/12 0520  WBC 10.2  HGB 12.5*  HCT 38.9*  PLT 429*   BMET  Recent Labs  07/03/12 0520 07/04/12 0400  NA 136 137  K 3.3* 2.8*  CL 100 101  CO2 27 28  GLUCOSE 139* 134*  BUN 12 13  CREATININE 1.06 0.96  CALCIUM 8.9 8.6   PT/INR No results found for this basename: LABPROT, INR,  in the last 72 hours ABG No results found for this basename: PHART, PCO2, PO2, HCO3,  in the last 72 hours  Studies/Results: Dg Abd 1 View  07/03/2012   *RADIOLOGY REPORT*  Clinical Data: Evaluate NG tube placement  ABDOMEN - 1 VIEW  Comparison: 07/03/2012  Findings: The nasogastric tube tip is in the stomach.  The side port is well below the GE junction.  Abnormal gaseous distention of the small and large bowel loops identified consistent with ileus. There are bilateral Nephro ureteral stents in place.  A percutaneous nephrostomy tube is noted on the left.  IMPRESSION:  1.  Placement of nasogastric tube with side port well below GE junction. 2.  Ileus pattern.   Original Report Authenticated By: Signa Kell, M.D.   Dg Abd 2 Views  07/03/2012   *RADIOLOGY REPORT*  Clinical Data: Distended abdomen with nausea and vomiting.  ABDOMEN - 2 VIEW  Comparison: 06/28/2012.  Findings:  Marked gaseous distention of small bowel and colon with associated air fluid levels.  Bilateral ureteral stents are in place with a presumed right lower quadrant ileal conduit. Percutaneous left nephrostomy as well.  IMPRESSION: Bowel gas pattern is indicative of an ileus.   Original Report Authenticated By: Leanna Battles, M.D.    Anti-infectives: Anti-infectives   Start     Dose/Rate Route Frequency Ordered Stop   06/25/12 1000  vancomycin (VANCOCIN) 1,250 mg in sodium chloride 0.9 % 250 mL IVPB  Status:  Discontinued     1,250 mg 166.7 mL/hr over 90 Minutes Intravenous Every 12 hours 06/25/12 0856 06/28/12 1102   06/24/12 2000  fluconazole (DIFLUCAN) IVPB 200 mg     200 mg 100 mL/hr over 60 Minutes Intravenous Every 24 hours 06/24/12 1733 06/26/12 2322   06/23/12 0900  vancomycin (VANCOCIN) 1,250 mg in sodium chloride 0.9 % 250 mL IVPB     1,250 mg 166.7 mL/hr over 90 Minutes Intravenous Every 12 hours 06/23/12 0522 06/24/12 2231   06/23/12 0600  piperacillin-tazobactam (ZOSYN) IVPB 3.375 g  Status:  Discontinued     3.375 g 12.5 mL/hr over 240 Minutes Intravenous 3 times per day 06/23/12 0447 06/28/12 1102   06/23/12 0330  cefTRIAXone (ROCEPHIN) 1 g in dextrose 5 % 50 mL IVPB     1  g 100 mL/hr over 30 Minutes Intravenous  Once 06/23/12 0319 06/23/12 0440      Assessment/Plan:  1 - Bladder Cancer / Cystectomy / Ureteral Revision- NED form cancer perspective.Loopogram without leak this admission.  2 - Fever / h/o Bacteruria - Now off all ABX given negative CX from blood, urine, pelvic fluid.  3 - Heme / Recent DVT + PE -Lovenox now stopped. SCD's in place. STRONGLY encouraged OOB to chair and ambulation. . PT following. Remain proph SQ heparin + SCD's.  4 - Pelvic Fluid Collection - Imaging and aspirate c/w non-infected hemaotma. Size stable / decreasing as expected. Eplained to pt this will resolve, but slowly (weeks-mos).  5 - Hemorrhoids / Lower GI Bleed - Agree with  conservative measures for now. Hgb stable.  6 - Nutrition / Nausea - Remains central active issue. No evidence of high-grade obstruction by recent imaging (contrast flows from mouth to rectum) and clinically with preserved flatus and stool. Agree with prokinetic with reglan as well as NGT for symptom improvement and TPN in interval, if remains problematic may have to reconsider placement for facility that can provide TPN (LTAC or similar).  7 - Disposition - SNF v. LTAC pending nutritional status.  Greatly appreciate hospitalist, gen surgery, ID, hematology,PT, GI input.  [facility], Chad Olson 07/04/2012

## 2012-07-04 NOTE — Progress Notes (Signed)
Patient seen, examined, and I agree with the above documentation, including the assessment and plan. Persistent ileus with NGT decompression K has been quite low and being aggressively replaced (target K>4) TPN for now for nutrition Scheduled zofran Ileus can be frustrating slow to improve, supportive care with IVFs, limits narcs, OOB when possible, normalize lytes

## 2012-07-04 NOTE — Progress Notes (Signed)
[facility] Progress Note  Subjective:  TPN started yesterday and NGT placed with a lot of return.  Having a lot of stools.  Still with nausea and pain at times.  Objective:  Vital signs in last 24 hours: Temp:  [97.4 F (36.3 C)-98.1 F (36.7 C)] 97.8 F (36.6 C) (07/03 0451) Pulse Rate:  [89-93] 89 (07/03 0451) Resp:  [16] 16 (07/03 0451) BP: (131-152)/(76-96) 136/76 mmHg (07/03 0451) SpO2:  [96 %-98 %] 97 % (07/03 0451) Last BM Date: 07/01/12 General:   Alert, Well-developed, in NAD, but uncomfortable. Heart:  Regular rate and rhythm; no murmurs Pulm:  CTAB.  No W/R/R. Abdomen:  Soft, still somewhat distended.  BS present but quiet.  Diffuse TTP without R/R/G. Extremities:  Without edema. Neurologic:  Alert and  oriented x4;  grossly normal neurologically. Psych:  Alert and cooperative. Normal mood and affect.  Intake/Output from previous day: 07/02 0701 - 07/03 0700 In: 694.3 [I.V.:336; NG/GT:90; TPN:268.3] Out: 3000 [Urine:1300; Emesis/NG output:1700]  Lab Results:  Recent Labs  07/03/12 0520  WBC 10.2  HGB 12.5*  HCT 38.9*  PLT 429*   BMET  Recent Labs  07/03/12 0520 07/04/12 0400  NA 136 137  K 3.3* 2.8*  CL 100 101  CO2 27 28  GLUCOSE 139* 134*  BUN 12 13  CREATININE 1.06 0.96  CALCIUM 8.9 8.6   LFT  Recent Labs  07/04/12 0400  PROT 5.9*  ALBUMIN 2.5*  AST 12  ALT 9  ALKPHOS 66  BILITOT 0.8   Dg Abd 1 View  07/03/2012   *RADIOLOGY REPORT*  Clinical Data: Evaluate NG tube placement  ABDOMEN - 1 VIEW  Comparison: 07/03/2012  Findings: The nasogastric tube tip is in the stomach.  The side port is well below the GE junction.  Abnormal gaseous distention of the small and large bowel loops identified consistent with ileus. There are bilateral Nephro ureteral stents in place.  A percutaneous nephrostomy tube is noted on the left.  IMPRESSION:  1.  Placement of nasogastric tube with side port well below GE junction. 2.  Ileus pattern.    Original Report Authenticated By: Signa Kell, M.D.   Dg Abd 2 Views  07/03/2012   *RADIOLOGY REPORT*  Clinical Data: Distended abdomen with nausea and vomiting.  ABDOMEN - 2 VIEW  Comparison: 06/28/2012.  Findings: Marked gaseous distention of small bowel and colon with associated air fluid levels.  Bilateral ureteral stents are in place with a presumed right lower quadrant ileal conduit. Percutaneous left nephrostomy as well.  IMPRESSION: Bowel gas pattern is indicative of an ileus.   Original Report Authenticated By: Leanna Battles, M.D.    Assessment / Plan: -Gastroparesis: Solid food particles and liquids seen in stomach during EGD 7/1. Placed on Reglan 10 mg ACHS.  -Ileus:  Seen on abdominal x-rays 7/2.  NGT placed with a lot of output. -Nausea/vomiting, anorexia:  Secondary to ileus, gastroparesis, narcotic use, and other medical issues. -Constipation with mass effect on rectum from hematoma.  Patient is on BID Miralax and is moving his bowels much better. -Pelvic hematoma measuring 20 x 8.5 cm  -Bladder cancer with recent surgeries, cystectomy and ureteral revisions  -DVT/PE 03/2012: Was on Lovenox, which has been discontinued due to hematoma listed above. On prophylactic heparin for now.  -Intermittent dysphagia: EGD revealed normal esophagus.  -Hypokalemia   -Continue ATC zofran and prn phenergan.  -Continue Reglan 10 mg IV ACHS.  -Need to limit narcotics as this will definitely worsen  his gastroparesis and overall gut motility.  -TNA started yesterday, 7/2. -Continue current bowel regimen.  -Need to closely monitor electrolytes.  Needs K+ replacement to goal greater than 4.   LOS: 12 days   ZEHR, JESSICA D.  07/04/2012, 10:03 AM  Pager number [phone]

## 2012-07-04 NOTE — Progress Notes (Signed)
Physical Therapy Treatment Patient Details Name: Chad Olson MRN: [id number] DOB: [birth year] Today's Date: 07/04/2012 Time: 1220-1251 PT Time Calculation (min): 31 min  PT Assessment / Plan / Recommendation  PT Comments   Pt with slow progress due to medical issues and stool incontinence, pt had a very rough night last night per RN, very lethargic today; Hopefully will be able to progress mobility next visit; Has NGT as well; Rn disconnected for mobility.  Follow Up Recommendations  SNF     Does the patient have the potential to tolerate intense rehabilitation     Barriers to Discharge        Equipment Recommendations  None recommended by PT    Recommendations for Other Services    Frequency Min 3X/week   Progress towards PT Goals Progress towards PT goals: Progressing toward goals  Plan Current plan remains appropriate    Precautions / Restrictions     Pertinent Vitals/Pain C/o pelvic pain with transitions, reports better at rest    Mobility  Bed Mobility Bed Mobility: Right Sidelying to Sit Right Sidelying to Sit: 4: Min assist;HOB elevated;With rails Details for Bed Mobility Assistance: min assist with trunk and incr time Transfers Transfers: Sit to Stand;Stand to Sit Sit to Stand: 4: Min guard;From bed;From toilet Stand to Sit: 4: Min guard;With upper extremity assist;To chair/3-in-1;To toilet Details for Transfer Assistance: min/guard for intial balance, safety Ambulation/Gait Ambulation/Gait Assistance: 4: Min guard Ambulation Distance (Feet): 10 Feet (times 2) Assistive device: Rolling walker Ambulation/Gait Assistance Details: pt with limited distance due to decr activity tol and incontinence of stool Gait Pattern: Step-through pattern;Decreased stride length;Wide base of support;Trunk flexed General Gait Details: verbal cues for safe use of RW    Exercises     PT Diagnosis:    PT Problem List:   PT Treatment Interventions:     PT Goals (current goals  can now be found in the care plan section) Acute Rehab PT Goals Time For Goal Achievement: 07/12/12 Potential to Achieve Goals: Good  Visit Information  Last PT Received On: 07/04/12 Assistance Needed: +1 (+2 helpful d/t stool incontinence)    Subjective Data      Cognition  Cognition Arousal/Alertness: Lethargic Behavior During Therapy: WFL for tasks assessed/performed;Flat affect Overall Cognitive Status:  (pt wife states he was "out of it" yesterday)    Development worker, international aid Balance Assessed: Yes Static Standing Balance Static Standing - Balance Support: Bilateral upper extremity supported;During functional activity Static Standing - Level of Assistance: 5: Stand by assistance  End of Session PT - End of Session Activity Tolerance: Patient limited by fatigue;Patient limited by pain Patient left: in chair;with call bell/phone within reach;with family/visitor present Nurse Communication: Mobility status   GP     [facility] [address] [address] 07/04/2012, 12:58 PM

## 2012-07-05 ENCOUNTER — Inpatient Hospital Stay (HOSPITAL_COMMUNITY): Payer: BC Managed Care – PPO

## 2012-07-05 LAB — BASIC METABOLIC PANEL
Chloride: 101 mEq/L (ref 96–112)
Creatinine, Ser: 0.93 mg/dL (ref 0.50–1.35)
GFR calc Af Amer: >90 mL/min (ref >90)
GFR calc non Af Amer: 86 mL/min — ABNORMAL LOW (ref >90)
Potassium: 2.9 mEq/L — ABNORMAL LOW (ref 3.5–5.1)

## 2012-07-05 LAB — MAGNESIUM: Magnesium: 2.5 mg/dL (ref 1.5–2.5)

## 2012-07-05 LAB — GLUCOSE, CAPILLARY: Glucose-Capillary: 136 mg/dL — ABNORMAL HIGH (ref 70–99)

## 2012-07-05 MED ORDER — FAT EMULSION 20 % IV EMUL
240.0000 mL | INTRAVENOUS | Status: AC
  Administered 2012-07-05: 240 mL via INTRAVENOUS
  Filled 2012-07-05: qty 250

## 2012-07-05 MED ORDER — TRACE MINERALS CR-CU-F-FE-I-MN-MO-SE-ZN IV SOLN
INTRAVENOUS | Status: AC
  Administered 2012-07-05: 17:00:00 via INTRAVENOUS
  Filled 2012-07-05: qty 2000

## 2012-07-05 MED ORDER — POTASSIUM CHLORIDE 10 MEQ/50ML IV SOLN
10.0000 meq | INTRAVENOUS | Status: AC
  Administered 2012-07-05 (×6): 10 meq via INTRAVENOUS
  Filled 2012-07-05 (×6): qty 50

## 2012-07-05 MED ORDER — POTASSIUM CHLORIDE 10 MEQ/100ML IV SOLN
10.0000 meq | INTRAVENOUS | Status: AC
  Administered 2012-07-05 (×6): 10 meq via INTRAVENOUS
  Filled 2012-07-05 (×6): qty 100

## 2012-07-05 NOTE — Progress Notes (Signed)
[facility]  Patient Status: Active (receiving services up to time of hospitalization)  [facility] is providing the following services: Mr. Walla was active the [facility] for RN, PT and IV abx prior to admission. [facility] pharmacy has partnerships with some of the SNF in the area to provide TPN.  We would be glad to help with supporting TPN needs at d/c as needed.       If patient discharges after hours, please call [phone].   Lanae Crumbly 07/05/2012, 1:34 PM

## 2012-07-05 NOTE — Progress Notes (Signed)
I agree with the above documentation, including the assessment and plan. Slowly resolving ileus.  Continue aggressive repletion of K Continue OOB to chair, ambulate when able

## 2012-07-05 NOTE — Progress Notes (Signed)
[facility] Progress Note  Subjective:  OOB in chair.  Having a lot of small frequent stools.  Miralax has already been stopped.  Still having a lot of output from NGT.  Trying not to take pain medication unless absolutely necessary.  Objective:  Vital signs in last 24 hours: Temp:  [97.7 F (36.5 C)-98 F (36.7 C)] 97.8 F (36.6 C) (07/04 0442) Pulse Rate:  [84-92] 92 (07/04 0442) Resp:  [20-21] 20 (07/04 0442) BP: (143-148)/(87-96) 148/87 mmHg (07/04 0442) SpO2:  [96 %-97 %] 96 % (07/04 0442) Last BM Date: 07/04/12 General:   Alert, Well-developed, in NAD, but uncomfortable.  OOB in chair. Heart:  Regular rate and rhythm; no murmurs Pulm:  CTAB.  No W/R/R. Abdomen:  Soft, distended.  BS present but quiet and hypoactive.  Diffuse TTP without R/R/G. Extremities:  Without edema. Neurologic:  Alert and  oriented x4;  grossly normal neurologically. Psych:  Alert and cooperative. Normal mood and affect.  Intake/Output from previous day: 07/03 0701 - 07/04 0700 In: 2061.8 [I.V.:645; NG/GT:180; IV Piggyback:200; TPN:1036.8] Out: 3600 [Urine:1300; Emesis/NG output:2300]  Lab Results:  Recent Labs  07/03/12 0520  WBC 10.2  HGB 12.5*  HCT 38.9*  PLT 429*   BMET  Recent Labs  07/03/12 0520 07/04/12 0400 07/05/12 0555  NA 136 137 136  K 3.3* 2.8* 2.9*  CL 100 101 101  CO2 27 28 27   GLUCOSE 139* 134* 144*  BUN 12 13 13   CREATININE 1.06 0.96 0.93  CALCIUM 8.9 8.6 8.6   LFT  Recent Labs  07/04/12 0400  PROT 5.9*  ALBUMIN 2.5*  AST 12  ALT 9  ALKPHOS 66  BILITOT 0.8   Dg Abd 1 View  07/05/2012   *RADIOLOGY REPORT*  Clinical Data: Ileus.  Abdominal pain and distention.  ABDOMEN - 1 VIEW  Comparison: 07/03/2012  Findings: NG tube is in the body of the stomach.  Bilateral ureteral stents and left nephrostomy catheter are noted.  There is increased dilatation of multiple small bowel loops.  The colon is not distended.  There is no stool in the rectum.  The  patient reportedly had a bowel movement this morning.  IMPRESSION: Decreased air in the colon.  Increased distention of small bowel loops.  Findings suggest a resolving ileus.   Original Report Authenticated By: Francene Boyers, M.D.   Dg Abd 1 View  07/03/2012   *RADIOLOGY REPORT*  Clinical Data: Evaluate NG tube placement  ABDOMEN - 1 VIEW  Comparison: 07/03/2012  Findings: The nasogastric tube tip is in the stomach.  The side port is well below the GE junction.  Abnormal gaseous distention of the small and large bowel loops identified consistent with ileus. There are bilateral Nephro ureteral stents in place.  A percutaneous nephrostomy tube is noted on the left.  IMPRESSION:  1.  Placement of nasogastric tube with side port well below GE junction. 2.  Ileus pattern.   Original Report Authenticated By: Signa Kell, M.D.   Dg Abd 2 Views  07/03/2012   *RADIOLOGY REPORT*  Clinical Data: Distended abdomen with nausea and vomiting.  ABDOMEN - 2 VIEW  Comparison: 06/28/2012.  Findings: Marked gaseous distention of small bowel and colon with associated air fluid levels.  Bilateral ureteral stents are in place with a presumed right lower quadrant ileal conduit. Percutaneous left nephrostomy as well.  IMPRESSION: Bowel gas pattern is indicative of an ileus.   Original Report Authenticated By: Leanna Battles, M.D.    Assessment /  Plan: -Gastroparesis: Solid food particles and liquids seen in stomach during EGD 7/1. Placed on Reglan 10 mg ACHS.  -Ileus: Seen on abdominal x-rays 7/2. NGT placed with a lot of output, which continues.  Follow-up x-ray today shows resolving ileus.  -Nausea/vomiting, anorexia: Secondary to ileus, gastroparesis, narcotic use, and other medical issues.  -Constipation with mass effect on rectum from hematoma.  Miralax has been discontinued. -Pelvic hematoma measuring 20 x 8.5 cm  -Bladder cancer with recent surgeries, cystectomy and ureteral revisions  -DVT/PE 03/2012: Was on Lovenox,  which has been discontinued due to hematoma listed above. On prophylactic heparin for now.  -Intermittent dysphagia: EGD revealed normal esophagus.  -Hypokalemia:  Still low this AM.   -Continue ATC zofran and prn phenergan.  -Continue Reglan 10 mg IV ACHS.  -Need to limit narcotics as this will definitely worsen his gastroparesis and overall gut motility.  He is trying to only take pain medications if pain is severe.  -TNA started 7/3, continue.  -Need to closely monitor electrolytes. Needs K+ replacement to goal greater than 4.    LOS: 13 days   [place] Kirchner D.  07/05/2012, 10:43 AM  Pager number [phone]
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
## 2012-07-05 NOTE — Progress Notes (Signed)
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 Healed well initially and sent home 6/17. Loopogram 6/27 without internal leak.  2 - Fever / h/o Bacteruria - Pt with MRSA in urine by hospital UCX 03/2012 and again subsequently in blood. Now on Vancomycin daily per ID x 4-6 week total (nearing end of course). Most recent UCX, BCX negative, New set pending from 6/22. Pt readmitted with low-grade fever 6/22 to 100.7 and malaise, no sig leukocytosis. Now on Vanc + Zosyn emirically. UCX with yeast (now getting diflucan x 3 days), BCX no growth to date.Pelvic fluid aspirate and no growth / final.  ID now following,and DC'd all ABX given eval.  3 - Heme / Recent DVT + PE -incidental PE by CT 03/2012 now on Lovenox, DVT resolved by most recent LE duplex. Repeat CT-PE 6/23 without PE or active bleed from pelvis and Lovenox DC'd. Hgb drift to 7 6/24 and transfused 2 u with response to 8, transfused 2u additional 6/25 with increase to 10. Hematology consult [name]) also agrees no treatment dose anticoagulation at this point, but resume proph dosing since he is not ambulating much.  4 - Pelvic Fluid Collection - Pelvic fluid collection without rim-enhancement noted on CT from ER 6/22. Appears partially simple fluid with some dependent blood by HU analysis, no active extrav of blood or urine by imaging. IR aspiration performed 6/26 and fluid c/w hematoma only (not purulent, not simple/drainable).   5 - Hemorrhoids / Lower GI Bleed - Long h/o hemorroids. Pt with worsened / friable bleeding hemorroids on admission that are quite bothersome. No prior  specific intervention, likely exacerbated by pelvic fluid collection / hematoma. Gen Surg recs conservative measures.   6 - Nutrition / Nausea - Pt with some element of baseline GI issues with GERD and prior small esophageal polyp. Has had exacerbation of nausea in house and not meeting nutritional goals. GI now involved and EGD + KUB with ileus / gastroparesis picture. No high-grade mechanical obstruction given continued stool + gas. While ileus resolving placed on TPN and NGT for symptom relief and to bridge him with nutrition.   7 - Disposition - PT eval has recommended DC to SNF (v. LTAC) when appropriate and family amenable.  PMH sig for obesity and left knee replacement, PE + DVT (now resolved). No CV disease.   Today Ricki is feeling relatively unchanged. Still most bothored by waves of nausea c/w ileus, more on left than right. KUB reviewed and with slight improvement in gas pattern only but still impressive ileus.  Objective: Vital signs in last 24 hours: Temp:  [97.7 F (36.5 C)-98 F (36.7 C)] 97.8 F (36.6 C) (07/04 0442) Pulse Rate:  [84-92] 92 (07/04 0442) Resp:  [20-21] 20 (07/04 0442) BP: (143-148)/(87-96) 148/87 mmHg (07/04 0442) SpO2:  [96 %-97 %] 96 % (07/04 0442) Last BM Date: 07/04/12  Intake/Output from previous day: 07/03 0701 - 07/04 0700 In: 2061.8 [I.V.:645; NG/GT:180; IV Piggyback:200; TPN:1036.8] Out: 3600 [Urine:1300; Emesis/NG output:2300] Intake/Output this shift:    General appearance: alert, cooperative, appears stated age and Wife at bedside Head: Normocephalic, without obvious abnormality, atraumatic Eyes: conjunctivae/corneas clear. PERRL, EOM's intact. Fundi benign. Ears: normal TM's and external ear canals both ears Nose: Nares  normal. Septum midline. Mucosa normal. No drainage or sinus tenderness. Throat: lips, mucosa, and tongue normal; teeth and gums normal Neck: no adenopathy, no carotid bruit, no JVD, supple, symmetrical, trachea midline,  thyroid not enlarged, symmetric, no tenderness/mass/nodules and NGT in place wtih bilious output. Back: symmetric, no curvature. ROM normal. No CVA tenderness. Resp: clear to auscultation bilaterally Chest wall: no tenderness Cardio: regular rate and rhythm, S1, S2 normal, no murmur, click, rub or gallop GI: Abd wtih stable disteniton more tympanic Rt than Lt. No reboud / guarding. Male genitalia: normal, Improved perineal ecchymoses Extremities: extremities normal, atraumatic, no cyanosis or edema and RUE PICC c/d/i. Pulses: 2+ and symmetric Skin: Skin color, texture, turgor normal. No rashes or lesions Lymph nodes: Cervical, supraclavicular, and axillary nodes normal. Neurologic: Grossly normal Incision/Wound: Midline incision c/d/i with staples. RLQ Urostomy pink/patent with clear urine and bander stents in place.  Lab Results:   Recent Labs  07/03/12 0520  WBC 10.2  HGB 12.5*  HCT 38.9*  PLT 429*   BMET  Recent Labs  07/04/12 0400 07/05/12 0555  NA 137 136  K 2.8* 2.9*  CL 101 101  CO2 28 27  GLUCOSE 134* 144*  BUN 13 13  CREATININE 0.96 0.93  CALCIUM 8.6 8.6   PT/INR No results found for this basename: LABPROT, INR,  in the last 72 hours ABG No results found for this basename: PHART, PCO2, PO2, HCO3,  in the last 72 hours  Studies/Results: Dg Abd 1 View  07/03/2012   *RADIOLOGY REPORT*  Clinical Data: Evaluate NG tube placement  ABDOMEN - 1 VIEW  Comparison: 07/03/2012  Findings: The nasogastric tube tip is in the stomach.  The side port is well below the GE junction.  Abnormal gaseous distention of the small and large bowel loops identified consistent with ileus. There are bilateral Nephro ureteral stents in place.  A percutaneous nephrostomy tube is noted on the left.  IMPRESSION:  1.  Placement of nasogastric tube with side port well below GE junction. 2.  Ileus pattern.   Original Report Authenticated By: Signa Kell, M.D.   Dg Abd 2 Views  07/03/2012    *RADIOLOGY REPORT*  Clinical Data: Distended abdomen with nausea and vomiting.  ABDOMEN - 2 VIEW  Comparison: 06/28/2012.  Findings: Marked gaseous distention of small bowel and colon with associated air fluid levels.  Bilateral ureteral stents are in place with a presumed right lower quadrant ileal conduit. Percutaneous left nephrostomy as well.  IMPRESSION: Bowel gas pattern is indicative of an ileus.   Original Report Authenticated By: Leanna Battles, M.D.    Anti-infectives: Anti-infectives   Start     Dose/Rate Route Frequency Ordered Stop   06/25/12 1000  vancomycin (VANCOCIN) 1,250 mg in sodium chloride 0.9 % 250 mL IVPB  Status:  Discontinued     1,250 mg 166.7 mL/hr over 90 Minutes Intravenous Every 12 hours 06/25/12 0856 06/28/12 1102   06/24/12 2000  fluconazole (DIFLUCAN) IVPB 200 mg     200 mg 100 mL/hr over 60 Minutes Intravenous Every 24 hours 06/24/12 1733 06/26/12 2322   06/23/12 0900  vancomycin (VANCOCIN) 1,250 mg in sodium chloride 0.9 % 250 mL IVPB     1,250 mg 166.7 mL/hr over 90 Minutes Intravenous Every 12 hours 06/23/12 0522 06/24/12 2231   06/23/12 0600  piperacillin-tazobactam (ZOSYN) IVPB 3.375 g  Status:  Discontinued     3.375 g 12.5 mL/hr over 240 Minutes Intravenous 3 times per day 06/23/12 0447 06/28/12 1102  06/23/12 0330  cefTRIAXone (ROCEPHIN) 1 g in dextrose 5 % 50 mL IVPB     1 g 100 mL/hr over 30 Minutes Intravenous  Once 06/23/12 0319 06/23/12 0440      Assessment/Plan:   1 - Bladder Cancer / Cystectomy / Ureteral Revision- NED form cancer perspective.Loopogram without leak this admission.  2 - Fever / h/o Bacteruria - Now off all ABX given negative CX from blood, urine, pelvic fluid.  3 - Heme / Recent DVT + PE -Lovenox now stopped. SCD's in place. STRONGLY encouraged OOB to chair and ambulation. . PT following. Remain proph SQ heparin + SCD's.  4 - Pelvic Fluid Collection - Imaging and aspirate c/w non-infected hemaotma. Size stable /  decreasing as expected. Eplained to pt this will resolve, but slowly (weeks-mos).  5 - Hemorrhoids / Lower GI Bleed - Agree with conservative measures for now. Hgb stable.  6 - Nutrition / Nausea - Remains central active issue. No evidence of high-grade obstruction by recent imaging (contrast flows from mouth to rectum) and clinically with preserved flatus and stool. Agree with prokinetic with reglan as well as NGT for symptom improvement and TPN in interval, if remains problematic may have to reconsider placement for facility that can provide TPN (LTAC or similar). KUB reviewed today and with slight improvement only + persitent high NG output favors keeping NGT to suction for now.  7 - Disposition - SNF v. LTAC pending nutritional status.  Greatly appreciate hospitalist, gen surgery, ID, hematology,PT, GI input.  [facility], Chesni Vos 07/05/2012

## 2012-07-05 NOTE — Progress Notes (Signed)
[facility] PROGRESS NOTE  Chad Olson:[id number] DOB: February 24, 1945 DOA: 06/22/2012 PCP: [name], MD  Assessment/Plan: Nausea/Vomiting: Possible gastroparesis. S/P endoscopy 7-1 show: The mucosa of the esophagus appeared normal; likely esophageal Spasm. Fluid/food residue in the gastric fundus, gastric body, and gastric antrum consistent with gastroparesis (felt, at least in  part, to explain nausea).  -GI recommend Reglan QID, limit narcotics and start ambulation.  -continue antiemetics and PPI  Gastroparesis/iIleus/poor nutrition: No evidence of obstruction on CT scan bd x-ray on 7/2. -continue NPO status -continue NGT -continue reglan -will continue TNA for nutrition bridging -Replete electrolytes (Goal is K as close to 4 as possible) -no more vomiting and improved nausea -x-ray demonstrating slowly resolving ileus  Hx of DVT and bilateral PE :  -repeated CT scan hematoma stable. HB stable at 11. Patient was started on prophylaxis dose heparin.  -Recent Doppler of his lower extremity was negative for DVT.  -Repeated a CT angiogram of the chest which was negative for PE.  -Patient received 3 Month treatment for PE.  -Appreciate Dr Cyndie Chime recommendation. No indication for IVC filter.  -will continue heparin for DVT prophylaxis  Pelvic hematoma: Patient relates worsening abdominal pain. Hematoma stable by CT scan. No orther pathology by CT scan. Monitor hb on heparin. Abdominal pain multifactorial, including hematoma.  Fever with mild leucocytosis: Resolved.  Pelvic fluid collection Aspiration negative for bacteria.  Completed treatment with IV vancomycin for MRSA bacteremia. (Vancomycin stopped 6-27).  Urine cx growing yeast, fluconazole stopped after 3 days tx.  Further recommendations per ID.  Anemia  Received a total of 4 units PRBC during this admission. Hb stable. Last Hgb 7/2 12.5  hemorrhoids  -General surgery consulted and recommended sitz bath  and ice packs for now.  -continue anusol rectal creams.   Hypokalemia  -Will replete as needed and will try to maintain above 4 -some potassium also given with TNA -due GI losses   Hypertension  Blood pressure stable. Will continue metoprolol IV  Hyperlipidemia  Continue holding statin for now and will resume when tolerating PO   bladder cancer: S/P surgery.  Treatment per primary team.  Loopogram negative for leak.    Code Status: Full Family Communication: wife and sister at bedside Disposition Plan: SNF at discharge  Antibiotics:  None currently  HPI/Subjective: Afebrile, no CP, no SOB. Still omplaining of abd pain and some nausea; no further vomiting. Distension continue improving after NGT placed. Positive BM's abd-XRay demonstrating resolving ileus  Objective: Filed Vitals:   07/04/12 1320 07/04/12 2017 07/05/12 0442 07/05/12 1300  BP: 148/96 143/89 148/87 129/70  Pulse: 84 86 92 89  Temp: 97.7 F (36.5 C) 98 F (36.7 C) 97.8 F (36.6 C) 98 F (36.7 C)  TempSrc: Oral Oral Oral Oral  Resp: 20 21 20 16   Height:      Weight:      SpO2: 96% 97% 96% 96%    Intake/Output Summary (Last 24 hours) at 07/05/12 1550 Last data filed at 07/05/12 1500  Gross per 24 hour  Intake 2773.83 ml  Output   3475 ml  Net -701.17 ml   Filed Weights   06/23/12 0405 06/23/12 0449  Weight: 103.2 kg (227 lb 8.2 oz) 100.109 kg (220 lb 11.2 oz)    Exam:   General:  Afebrile, complaining of abd pain and some Nausea; no more vomiting.  Increase BM's today  Cardiovascular: S1 and S2, no rubs or gallops  Respiratory: CTA bilaterally  Abdomen: softer, a lot  less distended, urostomy bag in place, mid surgical wound w/o signs or infection; tender to palpation; positive BS  Musculoskeletal: trace edema bilaterally  Data Reviewed: Basic Metabolic Panel:  Recent Labs Lab 06/29/12 0500 06/30/12 0350 07/01/12 0455 07/03/12 0520 07/04/12 0400 07/05/12 0555  NA 138 137  135 136 137 136  K 3.4* 3.6 4.2 3.3* 2.8* 2.9*  CL 102 101 98 100 101 101  CO2 28 29 30 27 28 27   GLUCOSE 116* 110* 125* 139* 134* 144*  BUN 5* 6 8 12 13 13   CREATININE 0.73 0.72 0.86 1.06 0.96 0.93  CALCIUM 8.8 8.8 9.3 8.9 8.6 8.6  MG 2.2  --   --  2.3 2.4 2.5  PHOS  --   --   --  3.5 3.1 3.3   CBC:  Recent Labs Lab 06/29/12 0400 06/30/12 0350 07/01/12 0455 07/03/12 0520  WBC  --   --   --  10.2  HGB 11.0* 11.4* 13.0 12.5*  HCT 34.3* 36.0* 40.7 38.9*  MCV  --   --   --  92.2  PLT  --   --   --  429*   CBG:  Recent Labs Lab 07/04/12 2015 07/04/12 2343 07/05/12 0437 07/05/12 0750 07/05/12 1211  GLUCAP 119* 121* 119* 132* 144*    Recent Results (from the past 240 hour(s))  CULTURE, ROUTINE-ABSCESS     Status: None   Collection Time    06/27/12 12:51 PM      Result Value Range Status   Specimen Description ABSCESS PELVIC   Final   Special Requests NONE   Final   Gram Stain     Final   Value: MODERATE WBC PRESENT, PREDOMINANTLY PMN     NO SQUAMOUS EPITHELIAL CELLS SEEN     NO ORGANISMS SEEN   Culture NO GROWTH 3 DAYS   Final   Report Status 06-09-2012 FINAL   Final     Studies: Dg Abd 1 View  07/05/2012   *RADIOLOGY REPORT*  Clinical Data: Ileus.  Abdominal pain and distention.  ABDOMEN - 1 VIEW  Comparison: 07/03/2012  Findings: NG tube is in the body of the stomach.  Bilateral ureteral stents and left nephrostomy catheter are noted.  There is increased dilatation of multiple small bowel loops.  The colon is not distended.  There is no stool in the rectum.  The patient reportedly had a bowel movement this morning.  IMPRESSION: Decreased air in the colon.  Increased distention of small bowel loops.  Findings suggest a resolving ileus.   Original Report Authenticated By: Francene Boyers, M.D.    Scheduled Meds: . heparin subcutaneous  5,000 Units Subcutaneous Q8H  . hydrocortisone   Rectal TID  . insulin aspart  0-15 Units Subcutaneous Q4H  . metoCLOPramide  (REGLAN) injection  10 mg Intravenous TID AC & HS  . metoprolol  5 mg Intravenous Q6H  . ondansetron  4 mg Intravenous Q4H  . potassium chloride  10 mEq Intravenous Q1 Hr x 6   Continuous Infusions: . 0.9 % NaCl with KCl 20 mEq / L 20 mL/hr (07/05/12 0551)  . Marland KitchenTPN (CLINIMIX-E) Adult 60 mL/hr at 07/04/12 1739   And  . fat emulsion 240 mL (07/04/12 1739)  . Marland KitchenTPN (CLINIMIX-E) Adult     And  . fat emulsion      Principal Problem:   Pelvic hematoma, male Active Problems:   Bladder cancer   Acute pulmonary embolism (3/14)   DVT (deep venous thrombosis)  3/14   Fever   Bacteremia MRSA 5/14   VTE (venous thromboembolism)   Protein-calorie malnutrition, severe   Acute blood loss anemia   Gastroparesis    Time spent: < 30 minutes   Shanekqua Schaper  [facility] Pager [phone]. If 7PM-7AM, please contact night-coverage at [email or web address], password [facility] 07/05/2012, 3:50 PM  LOS: 13 days

## 2012-07-06 DIAGNOSIS — E43 Unspecified severe protein-calorie malnutrition: Secondary | ICD-10-CM

## 2012-07-06 LAB — BASIC METABOLIC PANEL
BUN: 14 mg/dL (ref 6–23)
CO2: 29 mEq/L (ref 19–32)
Chloride: 100 mEq/L (ref 96–112)
Creatinine, Ser: 0.87 mg/dL (ref 0.50–1.35)

## 2012-07-06 LAB — GLUCOSE, CAPILLARY
Glucose-Capillary: 137 mg/dL — ABNORMAL HIGH (ref 70–99)
Glucose-Capillary: 140 mg/dL — ABNORMAL HIGH (ref 70–99)

## 2012-07-06 MED ORDER — METRONIDAZOLE IN NACL 5-0.79 MG/ML-% IV SOLN
500.0000 mg | Freq: Three times a day (TID) | INTRAVENOUS | Status: DC
  Administered 2012-07-06 – 2012-07-14 (×24): 500 mg via INTRAVENOUS
  Filled 2012-07-06 (×26): qty 100

## 2012-07-06 MED ORDER — TRACE MINERALS CR-CU-F-FE-I-MN-MO-SE-ZN IV SOLN
INTRAVENOUS | Status: AC
  Administered 2012-07-06: 17:00:00 via INTRAVENOUS
  Filled 2012-07-06: qty 2000

## 2012-07-06 MED ORDER — POTASSIUM CHLORIDE 10 MEQ/50ML IV SOLN
10.0000 meq | INTRAVENOUS | Status: AC
  Administered 2012-07-06 (×8): 10 meq via INTRAVENOUS
  Filled 2012-07-06 (×8): qty 50

## 2012-07-06 MED ORDER — POTASSIUM CHLORIDE 10 MEQ/50ML IV SOLN
10.0000 meq | INTRAVENOUS | Status: AC
  Administered 2012-07-06 (×3): 10 meq via INTRAVENOUS
  Filled 2012-07-06 (×3): qty 50

## 2012-07-06 MED ORDER — FAT EMULSION 20 % IV EMUL
240.0000 mL | INTRAVENOUS | Status: AC
  Administered 2012-07-06: 240 mL via INTRAVENOUS
  Filled 2012-07-06: qty 250

## 2012-07-06 MED ORDER — SODIUM CHLORIDE 0.9 % IR SOLN
500.0000 mg | Freq: Four times a day (QID) | Status: DC
  Administered 2012-07-06 – 2012-07-11 (×19): 500 mg via RECTAL
  Filled 2012-07-06 (×28): qty 500

## 2012-07-06 NOTE — Progress Notes (Signed)
[facility] Progress Note  Subjective:  Still a lot of output from NGT; dark green material in canister.  Having constant stooling.  OOB in chair right now and going to try to walk around today.  Objective:  Vital signs in last 24 hours: Temp:  [98 F (36.7 C)-98.2 F (36.8 C)] 98.1 F (36.7 C) (07/05 0533) Pulse Rate:  [83-93] 93 (07/05 0533) Resp:  [16] 16 (07/05 0533) BP: (129-152)/(70-94) 145/94 mmHg (07/05 0533) SpO2:  [95 %-97 %] 95 % (07/05 0533) Last BM Date: 07/05/12 General:   Alert, Well-developed, in NAD, but uncomfortable Heart:  Regular rate and rhythm; no murmurs Pulm:  CTAB.  No W/R/R. Abdomen:  Soft, distended.  BS present but quiet.  Diffuse TTP without R/R/G. Extremities:  Without edema. Neurologic:  Alert and  oriented x4;  grossly normal neurologically. Psych:  Alert and cooperative. Normal mood and affect.  Intake/Output from previous day: 07/04 0701 - 07/05 0700 In: 3301 [I.V.:542; IV Piggyback:800; TPN:1959] Out: 2900 [Urine:1350; Emesis/NG output:1550]  Lab Results:  BMET  Recent Labs  07/04/12 0400 07/05/12 0555 07/06/12 0450  NA 137 136 137  K 2.8* 2.9* 3.1*  CL 101 101 100  CO2 28 27 29   GLUCOSE 134* 144* 144*  BUN 13 13 14   CREATININE 0.96 0.93 0.87  CALCIUM 8.6 8.6 8.9   LFT  Recent Labs  07/04/12 0400  PROT 5.9*  ALBUMIN 2.5*  AST 12  ALT 9  ALKPHOS 66  BILITOT 0.8   Dg Abd 1 View  07/05/2012   *RADIOLOGY REPORT*  Clinical Data: Ileus.  Abdominal pain and distention.  ABDOMEN - 1 VIEW  Comparison: 07/03/2012  Findings: NG tube is in the body of the stomach.  Bilateral ureteral stents and left nephrostomy catheter are noted.  There is increased dilatation of multiple small bowel loops.  The colon is not distended.  There is no stool in the rectum.  The patient reportedly had a bowel movement this morning.  IMPRESSION: Decreased air in the colon.  Increased distention of small bowel loops.  Findings suggest a  resolving ileus.   Original Report Authenticated By: Francene Boyers, M.D.    Assessment / Plan: -Gastroparesis: Solid food particles and liquids seen in stomach during EGD 7/1. Placed on Reglan 10 mg ACHS.  -Ileus: Seen on abdominal x-rays 7/2. NGT placed with a lot of output, which continues. -Nausea/vomiting, anorexia: Secondary to ileus, gastroparesis, narcotic use, and other medical issues.  -Pelvic hematoma measuring 20 x 8.5 cm  -Bladder cancer with recent surgeries, cystectomy and ureteral revisions  -DVT/PE 03/2012: Was on Lovenox, which has been discontinued due to hematoma listed above. On prophylactic heparin for now.  -Intermittent dysphagia: EGD revealed normal esophagus.  -Hypokalemia: Still low this AM.    -Continue ATC zofran and prn phenergan.  -Continue Reglan 10 mg IV ACHS.  -Need to limit narcotics as this will definitely worsen his gastroparesis and overall gut motility. He is trying to only take pain medications if pain is severe.  -TNA started 7/3, continue.  -Need to closely monitor electrolytes. Needs K+ replacement to goal greater than 4.  -Agree with stool for Cdiff since he is now having frequent, constant stooling. -Agree with OOB to chair and walking as tolerated.      LOS: 14 days   Chad Alicea D.  07/06/2012, 9:04 AM  Pager number [phone]
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
## 2012-07-06 NOTE — Progress Notes (Signed)
Patient was moved to 1420 per patient's request for larger room. Will continue to monitor. Erskin Burnet RN

## 2012-07-06 NOTE — Progress Notes (Addendum)
[facility] PROGRESS NOTE  Chad NIEBLAS XBJ:[id number] DOB: August 31, 1945 DOA: 06/22/2012 PCP: [name], MD  Assessment/Plan: Nausea/Vomiting: Possible gastroparesis. S/P endoscopy 7-1 show: The mucosa of the esophagus appeared normal; likely esophageal Spasm. Fluid/food residue in the gastric fundus, gastric body, and gastric antrum consistent with gastroparesis (felt, at least in  part, to explain nausea).  -GI recommend Reglan QID, limit narcotics and start ambulation.  -continue antiemetics and PPI -C. Diff checked this morning was positive  Gastroparesis/iIleus/poor nutrition: No evidence of obstruction on CT scan bd x-ray on 7/2. -continue NPO status -continue NGT -continue reglan -will continue TNA for nutrition bridging -continue electrolytes repletion (Goal is K as close to 4 as possible) -no more vomiting and improved nausea -x-ray demonstrating slowly resolving ileus; will repeat in am  C. Diff -due to ileus and inability to tolerate PO, qualify in complicated case of C. Diff by protocol. -will start tx with vanc enemas and IV flagyl  Hx of DVT and bilateral PE :  -repeated CT scan hematoma stable. HB stable at 11. Patient was started on prophylaxis dose heparin.  -Recent Doppler of his lower extremity was negative for DVT.  -Repeated a CT angiogram of the chest which was negative for PE.  -Patient received 3 Month treatment for PE.  -Appreciate Dr Cyndie Chime recommendation. No indication for IVC filter.  -will continue heparin for DVT prophylaxis  Pelvic hematoma: Patient relates worsening abdominal pain. Hematoma stable by CT scan. No orther pathology by CT scan. Monitor hb on heparin. Abdominal pain multifactorial, including hematoma.  Fever with mild leucocytosis: Resolved.  Pelvic fluid collection Aspiration negative for bacteria.  Completed treatment with IV vancomycin for MRSA bacteremia. (Vancomycin stopped 6-27).  Urine cx growing yeast,  fluconazole stopped after 3 days tx.  Further recommendations per ID.  Anemia  Received a total of 4 units PRBC during this admission. Hb stable. Last Hgb 7/2 12.5 -CBC in am  hemorrhoids  -General surgery consulted and recommended sitz bath and ice packs for now.  -continue anusol rectal creams.   Hypokalemia  -Will replete as needed and will try to maintain above 4 -some potassium also given with TNA -due GI losses  -will continue with aggressive repletion.  Hypertension  Blood pressure stable. Will continue metoprolol IV  Hyperlipidemia  Continue holding statin for now and will resume when tolerating PO   bladder cancer: S/P surgery.  Treatment per primary team.  Loopogram negative for leak.    Code Status: Full Family Communication: wife and sister at bedside Disposition Plan: SNF at discharge  Antibiotics:  None currently  HPI/Subjective: Afebrile, no CP, no SOB. Feeling a lot better; wants ice chips if possible (mouth feels really dry); OOB to chair and short walking. Potassium 3.1 today  Objective: Filed Vitals:   07/05/12 1300 07/05/12 2048 07/06/12 0000 07/06/12 0533  BP: 129/70 138/89 152/87 145/94  Pulse: 89 83 88 93  Temp: 98 F (36.7 C) 98.2 F (36.8 C) 98.2 F (36.8 C) 98.1 F (36.7 C)  TempSrc: Oral Oral Oral Oral  Resp: 16 16 16 16   Height:      Weight:      SpO2: 96% 97% 95% 95%    Intake/Output Summary (Last 24 hours) at 07/06/12 1005 Last data filed at 07/06/12 0849  Gross per 24 hour  Intake   3151 ml  Output   2900 ml  Net    251 ml   Filed Weights   06/23/12 0405 06/23/12 0449  Weight:  103.2 kg (227 lb 8.2 oz) 100.109 kg (220 lb 11.2 oz)    Exam:   General:  Afebrile, reports improvement in his abd pain; also less nauseous; no more vomiting.  BM's continue  Cardiovascular: S1 and S2, no rubs or gallops  Respiratory: CTA bilaterally  Abdomen: softer, a lot less distended, urostomy bag in place, mid surgical wound w/o  signs or infection; tender to palpation; positive BS  Musculoskeletal: trace edema bilaterally  Data Reviewed: Basic Metabolic Panel:  Recent Labs Lab 07/01/12 0455 07/03/12 0520 07/04/12 0400 07/05/12 0555 07/06/12 0450  NA 135 136 137 136 137  K 4.2 3.3* 2.8* 2.9* 3.1*  CL 98 100 101 101 100  CO2 30 27 28 27 29   GLUCOSE 125* 139* 134* 144* 144*  BUN 8 12 13 13 14   CREATININE 0.86 1.06 0.96 0.93 0.87  CALCIUM 9.3 8.9 8.6 8.6 8.9  MG  --  2.3 2.4 2.5 2.5  PHOS  --  3.5 3.1 3.3 3.8   CBC:  Recent Labs Lab 06/30/12 0350 07/01/12 0455 07/03/12 0520  WBC  --   --  10.2  HGB 11.4* 13.0 12.5*  HCT 36.0* 40.7 38.9*  MCV  --   --  92.2  PLT  --   --  429*   CBG:  Recent Labs Lab 07/05/12 1710 07/05/12 2019 07/05/12 2359 07/06/12 0348 07/06/12 0725  GLUCAP 135* 136* 140* 137* 147*    Recent Results (from the past 240 hour(s))  CULTURE, ROUTINE-ABSCESS     Status: None   Collection Time    06/27/12 12:51 PM      Result Value Range Status   Specimen Description ABSCESS PELVIC   Final   Special Requests NONE   Final   Gram Stain     Final   Value: MODERATE WBC PRESENT, PREDOMINANTLY PMN     NO SQUAMOUS EPITHELIAL CELLS SEEN     NO ORGANISMS SEEN   Culture NO GROWTH 3 DAYS   Final   Report Status 06/30/2012 FINAL   Final     Studies: Dg Abd 1 View  07/05/2012   *RADIOLOGY REPORT*  Clinical Data: Ileus.  Abdominal pain and distention.  ABDOMEN - 1 VIEW  Comparison: 07/03/2012  Findings: NG tube is in the body of the stomach.  Bilateral ureteral stents and left nephrostomy catheter are noted.  There is increased dilatation of multiple small bowel loops.  The colon is not distended.  There is no stool in the rectum.  The patient reportedly had a bowel movement this morning.  IMPRESSION: Decreased air in the colon.  Increased distention of small bowel loops.  Findings suggest a resolving ileus.   Original Report Authenticated By: Francene Boyers, M.D.    Scheduled  Meds: . heparin subcutaneous  5,000 Units Subcutaneous Q8H  . hydrocortisone   Rectal TID  . insulin aspart  0-15 Units Subcutaneous Q4H  . metoCLOPramide (REGLAN) injection  10 mg Intravenous TID AC & HS  . metoprolol  5 mg Intravenous Q6H  . ondansetron  4 mg Intravenous Q4H  . potassium chloride  10 mEq Intravenous Q1 Hr x 6  . potassium chloride  10 mEq Intravenous Q1 Hr x 3   Continuous Infusions: . 0.9 % NaCl with KCl 20 mEq / L 50 mL/hr at 07/05/12 1635  . Marland KitchenTPN (CLINIMIX-E) Adult 80 mL/hr at 07/05/12 1703   And  . fat emulsion 240 mL (07/05/12 1703)  . Marland KitchenTPN (CLINIMIX-E) Adult  And  . fat emulsion      Principal Problem:   Pelvic hematoma, male Active Problems:   Bladder cancer   Acute pulmonary embolism (3/14)   DVT (deep venous thrombosis) 3/14   Fever   Bacteremia MRSA 5/14   VTE (venous thromboembolism)   Protein-calorie malnutrition, severe   Acute blood loss anemia   Gastroparesis    Time spent: < 30 minutes   Chad Olson  [facility] Pager [phone]. If 7PM-7AM, please contact night-coverage at [email or web address], password [facility] 07/06/2012, 10:05 AM  LOS: 14 days

## 2012-07-06 NOTE — Progress Notes (Signed)
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 Healed well initially and sent home 6/17. Loopogram 6/27 without internal leak.  2 - Fever / h/o Bacteruria - Pt with MRSA in urine by hospital UCX 03/2012 and again subsequently in blood. Now on Vancomycin daily per ID x 4-6 week total (nearing end of course). Most recent UCX, BCX negative, New set pending from 6/22. Pt readmitted with low-grade fever 6/22 to 100.7 and malaise, no sig leukocytosis. Now on Vanc + Zosyn emirically. UCX with yeast (trated diflucan x 3 days), BCX no growth to date.Pelvic fluid aspirate and no growth / final.  ID now following,and DC'd all ABX given eval.  3 - Heme / Recent DVT + PE -incidental PE by CT 03/2012 now on Lovenox, DVT resolved by most recent LE duplex. Repeat CT-PE 6/23 without PE or active bleed from pelvis and Lovenox DC'd. Hgb drift to 7 6/24 and transfused 2 u with response to 8, transfused 2u additional 6/25 with increase to 10. Hematology consult [name]) also agrees no treatment dose anticoagulation at this point, but resume proph dosing since he is not ambulating much.  4 - Pelvic Fluid Collection - Pelvic fluid collection without rim-enhancement noted on CT from ER 6/22. Appears partially simple fluid with some dependent blood by HU analysis, no active extrav of blood or urine by imaging. IR aspiration performed 6/26 and fluid c/w hematoma only (not purulent, not simple/drainable).   5 - Hemorrhoids / Lower GI Bleed - Long h/o hemorroids. Pt with worsened / friable bleeding hemorroids on admission that are quite bothersome. No prior  specific intervention, likely exacerbated by pelvic fluid collection / hematoma. Gen Surg recs conservative measures.   6 - Nutrition / Nausea - Pt with some element of baseline GI issues with GERD and prior small esophageal polyp. Has had exacerbation of nausea in house and not meeting nutritional goals. GI now involved and EGD + KUB with ileus / gastroparesis picture. No high-grade mechanical obstruction given continued stool + gas. While ileus resolving placed on TPN and NGT for symptom relief and to bridge him with nutrition.   7 - Disposition - PT eval has recommended DC to SNF (v. LTAC) when appropriate and family amenable.  PMH sig for obesity and left knee replacement, PE + DVT (now resolved). No CV disease.   Today Chad Olson is feeling somewhat improved. Nausea still present but less distended. He is getting more energy on the TPN. Radiologist agrees most recent KUB improved, NGT remains high-output, continues to have BM's + flatus.  Objective: Vital signs in last 24 hours: Temp:  [98 F (36.7 C)-98.2 F (36.8 C)] 98.1 F (36.7 C) (07/05 0533) Pulse Rate:  [83-93] 93 (07/05 0533) Resp:  [16] 16 (07/05 0533) BP: (129-152)/(70-94) 145/94 mmHg (07/05 0533) SpO2:  [95 %-97 %] 95 % (07/05 0533) Last BM Date: 07/05/12  Intake/Output from previous day: 07/04 0701 - 07/05 0700 In: 2181 [I.V.:142; IV Piggyback:800; TPN:1239] Out: 2900 [Urine:1350; Emesis/NG output:1550] Intake/Output this shift:    General appearance: alert, cooperative and appears stated age Head: Normocephalic, without obvious abnormality, atraumatic Eyes: conjunctivae/corneas clear. PERRL, EOM's intact. Fundi benign. Ears: normal TM's and external ear canals both ears Nose: Nares normal. Septum midline. Mucosa normal.  No drainage or sinus tenderness., NGT in rt rare wtih bilious output. Throat: lips, mucosa, and tongue normal; teeth and gums normal Neck: no adenopathy, no carotid bruit, no JVD, supple, symmetrical,  trachea midline and thyroid not enlarged, symmetric, no tenderness/mass/nodules Back: symmetric, no curvature. ROM normal. No CVA tenderness. Resp: clear to auscultation bilaterally Chest wall: no tenderness Cardio: regular rate and rhythm, S1, S2 normal, no murmur, click, rub or gallop GI: Less tympanis and distended today. Still most gas in rt hemi-abd by percussion. No rebound / guarding. Male genitalia: normal, perineal ecchymoses continues to improve. Extremities: extremities normal, atraumatic, no cyanosis or edema Pulses: 2+ and symmetric Skin: Skin color, texture, turgor normal. No rashes or lesions Lymph nodes: Cervical, supraclavicular, and axillary nodes normal. Neurologic: Grossly normal Incision/Wound: lower midline incision c/d/i. RLQ urostomy pink / patent with bander stents. RUE PICC c/d/i. SCD's in place. Lt neph tube capped.  Lab Results:  No results found for this basename: WBC, HGB, HCT, PLT,  in the last 72 hours BMET  Recent Labs  07/05/12 0555 07/06/12 0450  NA 136 137  K 2.9* 3.1*  CL 101 100  CO2 27 29  GLUCOSE 144* 144*  BUN 13 14  CREATININE 0.93 0.87  CALCIUM 8.6 8.9   PT/INR No results found for this basename: LABPROT, INR,  in the last 72 hours ABG No results found for this basename: PHART, PCO2, PO2, HCO3,  in the last 72 hours  Studies/Results: Dg Abd 1 View  07/05/2012   *RADIOLOGY REPORT*  Clinical Data: Ileus.  Abdominal pain and distention.  ABDOMEN - 1 VIEW  Comparison: 07/03/2012  Findings: NG tube is in the body of the stomach.  Bilateral ureteral stents and left nephrostomy catheter are noted.  There is increased dilatation of multiple small bowel loops.  The colon is not distended.  There is no stool in the rectum.  The patient reportedly had a bowel movement this morning.  IMPRESSION: Decreased air in the colon.  Increased distention of small bowel loops.  Findings suggest a resolving ileus.   Original Report Authenticated By: Francene Boyers, M.D.    Anti-infectives: Anti-infectives   Start     Dose/Rate Route Frequency Ordered Stop   06/25/12 1000  vancomycin (VANCOCIN) 1,250 mg in sodium chloride 0.9 % 250 mL IVPB  Status:  Discontinued     1,250 mg 166.7 mL/hr over 90 Minutes Intravenous Every 12 hours 06/25/12 0856 06/28/12 1102   06/24/12 2000  fluconazole (DIFLUCAN) IVPB 200 mg     200 mg 100 mL/hr over 60 Minutes Intravenous Every 24 hours 06/24/12 1733 06/26/12 2322   06/23/12 0900  vancomycin (VANCOCIN) 1,250 mg in sodium chloride 0.9 % 250 mL IVPB     1,250 mg 166.7 mL/hr over 90 Minutes Intravenous Every 12 hours 06/23/12 0522 06/24/12 2231   06/23/12 0600  piperacillin-tazobactam (ZOSYN) IVPB 3.375 g  Status:  Discontinued     3.375 g 12.5 mL/hr over 240 Minutes Intravenous 3 times per day 06/23/12 0447 06/28/12 1102   06/23/12 0330  cefTRIAXone (ROCEPHIN) 1 g in dextrose 5 % 50 mL IVPB     1 g 100 mL/hr over 30 Minutes Intravenous  Once 06/23/12 0319 06/23/12 0440      Assessment/Plan:  1 - Bladder Cancer / Cystectomy / Ureteral Revision- NED form cancer perspective.Loopogram without leak this admission.  2 - Fever / h/o Bacteruria - Now off all ABX given negative CX from blood, urine, pelvic fluid.  3 - Heme / Recent DVT + PE -Lovenox now stopped. SCD's in place. STRONGLY encouraged OOB to chair and ambulation. . PT following. Remain proph SQ heparin + SCD's.  4 - Pelvic Fluid Collection - Imaging and aspirate c/w non-infected hemaotma. Size stable / decreasing as expected. Eplained to pt this will resolve, but slowly (weeks-mos).  5 - Hemorrhoids / Lower GI Bleed - Agree with conservative measures for now. Hgb stable.  6 - Nutrition / Nausea - Remains central active issue. Most recent KUB and physical exam with some  improvement + persitent high NG output favors keeping NGT to suction for now.  7 - Disposition - SNF v. LTAC pending nutritional status.  Greatly appreciate hospitalist, gen  surgery, ID, hematology,PT, GI, TPN pharmacy input.  [facility], Alexsa Flaum 07/06/2012

## 2012-07-06 NOTE — Progress Notes (Addendum)
Patient seen, examined, and I agree with the above documentation, including the assessment and plan. New data indicating C. difficile infection as of this afternoon No abdominal pain, hypoactive but present bowel sound Appropriately started on IV metronidazole by primary team, vancomycin enemas added as well NG tube still with considerable output, though plain film slightly better He has been out of bed and walking, which I have encouraged him to continue He is frustrated and mentally down about his overall condition Continue potassium replacement Avoid narcotics when possible

## 2012-07-07 ENCOUNTER — Inpatient Hospital Stay (HOSPITAL_COMMUNITY): Payer: BC Managed Care – PPO

## 2012-07-07 LAB — GLUCOSE, CAPILLARY
Glucose-Capillary: 137 mg/dL — ABNORMAL HIGH (ref 70–99)
Glucose-Capillary: 140 mg/dL — ABNORMAL HIGH (ref 70–99)
Glucose-Capillary: 144 mg/dL — ABNORMAL HIGH (ref 70–99)

## 2012-07-07 LAB — BASIC METABOLIC PANEL
BUN: 15 mg/dL (ref 6–23)
Creatinine, Ser: 0.89 mg/dL (ref 0.50–1.35)
GFR calc Af Amer: >90 mL/min (ref >90)
GFR calc non Af Amer: 87 mL/min — ABNORMAL LOW (ref >90)
Glucose, Bld: 169 mg/dL — ABNORMAL HIGH (ref 70–99)

## 2012-07-07 MED ORDER — FAMOTIDINE IN NACL 20-0.9 MG/50ML-% IV SOLN
20.0000 mg | INTRAVENOUS | Status: DC
  Administered 2012-07-07 – 2012-07-17 (×11): 20 mg via INTRAVENOUS
  Filled 2012-07-07 (×11): qty 50

## 2012-07-07 MED ORDER — POTASSIUM CHLORIDE 10 MEQ/50ML IV SOLN
10.0000 meq | INTRAVENOUS | Status: AC
  Administered 2012-07-07 (×4): 10 meq via INTRAVENOUS
  Filled 2012-07-07 (×4): qty 50

## 2012-07-07 MED ORDER — FAT EMULSION 20 % IV EMUL
240.0000 mL | INTRAVENOUS | Status: AC
  Administered 2012-07-07: 240 mL via INTRAVENOUS
  Filled 2012-07-07: qty 250

## 2012-07-07 MED ORDER — TRACE MINERALS CR-CU-F-FE-I-MN-MO-SE-ZN IV SOLN
INTRAVENOUS | Status: AC
  Administered 2012-07-07: 18:00:00 via INTRAVENOUS
  Filled 2012-07-07: qty 2000
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
## 2012-07-07 NOTE — Progress Notes (Signed)
[facility] Progress Note  Subjective:  Says that pain is a little better today.  Found Cdiff infection yesterday.  Got repeat abdominal x-ray this AM.  Still with a lot of NGT output.  Objective:  Vital signs in last 24 hours: Temp:  [97.7 F (36.5 C)-98.6 F (37 C)] 97.7 F (36.5 C) (07/06 0451) Pulse Rate:  [86-97] 97 (07/06 0451) Resp:  [14-20] 18 (07/06 0451) BP: (129-139)/(79-92) 136/92 mmHg (07/06 0451) SpO2:  [96 %-98 %] 98 % (07/06 0451) Last BM Date: 07/06/12 General:   Alert, Well-developed, in NAD Heart:  Regular rate and rhythm; no murmurs Pulm:  CTAB.  No W/R/R. Abdomen:  Soft, still distended.  BS present, but hypoactive.  Mild diffuse TTP.   Extremities:  Without edema. Neurologic:  Alert and  oriented x4;  grossly normal neurologically. Psych:  Alert and cooperative. Normal mood and affect.  Intake/Output from previous day: 07/05 0701 - 07/06 0700 In: 2126.8 [I.V.:210.8; NG/GT:50; IV Piggyback:750; TPN:1116] Out: 4250 [Urine:800; Emesis/NG output:3450] Intake/Output this shift: Total I/O In: 345 [I.V.:345] Out: 600 [Emesis/NG output:600]  BMET  Recent Labs  07/05/12 0555 07/06/12 0450 07/07/12 0410  NA 136 137 138  K 2.9* 3.1* 4.1  CL 101 100 100  CO2 27 29 31   GLUCOSE 144* 144* 169*  BUN 13 14 15   CREATININE 0.93 0.87 0.89  CALCIUM 8.6 8.9 9.3   Assessment / Plan: -Gastroparesis: Solid food particles and liquids seen in stomach during EGD 7/1. Placed on Reglan 10 mg ACHS.  -Ileus: Seen on abdominal x-rays 7/2. NGT placed with a lot of output, which continues.  -Nausea/vomiting, anorexia: Secondary to ileus, gastroparesis, narcotic use, and other medical issues.  -Pelvic hematoma measuring 20 x 8.5 cm  -Bladder cancer with recent surgeries, cystectomy and ureteral revisions  -DVT/PE 03/2012: Was on Lovenox, which has been discontinued due to hematoma listed above. On prophylactic heparin for now.  -Intermittent dysphagia: EGD  revealed normal esophagus.  -Hypokalemia: Resolved with IV replacement. -Cdiff infection:  On IV flagyl and vancomycin enemas.   -Continue ATC zofran and prn phenergan.  -Continue Reglan 10 mg IV ACHS.  -Need to limit narcotics as this will definitely worsen his gastroparesis and overall gut motility. He is trying to only take pain medications if pain is severe.  -TNA started 7/3, continue.  -Need to closely monitor electrolytes. Need to keep K+ greater than 4.  -Agree with OOB to chair and walking as tolerated. -Continue IV flagyl and vanco enemas. -Follow-up repeat abdominal films.    LOS: 15 days   Brittney Mucha D.  07/07/2012, 8:38 AM  Pager number [phone]

## 2012-07-07 NOTE — Progress Notes (Signed)
Patient ID: Chad Olson, male   DOB: Feb 27, 1945, 67 y.o.   MRN: [id number] 5 Days Post-Op  Subjective: Chad Olson is stable today.  He continues to have some left abdominal discomfort and frequent BM's.  The nausea is improved with NG suction.   He continues to ambulate.  He has good UOP. ROS: Negative except as above.  Objective: Vital signs in last 24 hours: Temp:  [97.7 F (36.5 C)-98.6 F (37 C)] 97.7 F (36.5 C) (07/06 0451) Pulse Rate:  [86-97] 97 (07/06 0451) Resp:  [14-20] 18 (07/06 0451) BP: (129-139)/(79-92) 136/92 mmHg (07/06 0451) SpO2:  [96 %-98 %] 98 % (07/06 0451)  Intake/Output from previous day: 07/05 0701 - 07/06 0700 In: 2126.8 [I.V.:210.8; NG/GT:50; IV Piggyback:750; TPN:1116] Out: 4250 [Urine:800; Emesis/NG output:3450] Intake/Output this shift: Total I/O In: 345 [I.V.:345] Out: 600 [Emesis/NG output:600]  General appearance: alert and no distress Resp: clear to auscultation bilaterally Cardio: regular rate and rhythm GI: soft with minimal tenderness.  +BS.  stoma pink and productive with stents in place.  Wound intact without erythema.   Male genitalia: normal Extremities: extremities normal, atraumatic, no cyanosis or edema Skin: Skin color, texture, turgor normal. No rashes or lesions  Lab Results:  No results found for this basename: WBC, HGB, HCT, PLT,  in the last 72 hours BMET  Recent Labs  07/06/12 0450 07/07/12 0410  NA 137 138  K 3.1* 4.1  CL 100 100  CO2 29 31  GLUCOSE 144* 169*  BUN 14 15  CREATININE 0.87 0.89  CALCIUM 8.9 9.3   PT/INR No results found for this basename: LABPROT, INR,  in the last 72 hours ABG No results found for this basename: PHART, PCO2, PO2, HCO3,  in the last 72 hours  Studies/Results: No results found.  Anti-infectives: Anti-infectives   Start     Dose/Rate Route Frequency Ordered Stop   07/06/12 1800  vancomycin (VANCOCIN) 500 mg in sodium chloride irrigation 0.9 % 100 mL ENEMA     500 mg  Rectal 4 times per day 07/06/12 1335 07/20/12 1759   07/06/12 1430  metroNIDAZOLE (FLAGYL) IVPB 500 mg     500 mg 100 mL/hr over 60 Minutes Intravenous 3 times per day 07/06/12 1335     06/25/12 1000  vancomycin (VANCOCIN) 1,250 mg in sodium chloride 0.9 % 250 mL IVPB  Status:  Discontinued     1,250 mg 166.7 mL/hr over 90 Minutes Intravenous Every 12 hours 06/25/12 0856 06/28/12 1102   06/24/12 2000  fluconazole (DIFLUCAN) IVPB 200 mg     200 mg 100 mL/hr over 60 Minutes Intravenous Every 24 hours 06/24/12 1733 06/26/12 2322   06/23/12 0900  vancomycin (VANCOCIN) 1,250 mg in sodium chloride 0.9 % 250 mL IVPB     1,250 mg 166.7 mL/hr over 90 Minutes Intravenous Every 12 hours 06/23/12 0522 06/24/12 2231   06/23/12 0600  piperacillin-tazobactam (ZOSYN) IVPB 3.375 g  Status:  Discontinued     3.375 g 12.5 mL/hr over 240 Minutes Intravenous 3 times per day 06/23/12 0447 06/28/12 1102   06/23/12 0330  cefTRIAXone (ROCEPHIN) 1 g in dextrose 5 % 50 mL IVPB     1 g 100 mL/hr over 30 Minutes Intravenous  Once 06/23/12 0319 06/23/12 0440      Current Facility-Administered Medications  Medication Dose Route Frequency Provider Last Rate Last Dose  . 0.9 % NaCl with KCl 20 mEq/ L  infusion   Intravenous Continuous Vassie Loll, MD 50 mL/hr at  07/07/12 0017    . TPN (CLINIMIX-E) Adult   Intravenous Continuous TPN Teressa Lower, RPH 80 mL/hr at 07/06/12 1720     And  . fat emulsion 20 % infusion 240 mL  240 mL Intravenous Continuous TPN Teressa Lower, RPH 10 mL/hr at 07/06/12 1721 240 mL at 07/06/12 1721  . hydrocortisone (ANUSOL-HC) 2.5 % rectal cream   Rectal TID Nishant Dhungel, MD      . insulin aspart (novoLOG) injection 0-15 Units  0-15 Units Subcutaneous Q4H Milford Cage, MD   3 Units at 07/07/12 [phone]  . metoCLOPramide (REGLAN) injection 10 mg  10 mg Intravenous TID AC & HS Beverley Fiedler, MD   10 mg at 07/06/12 2216  . metoprolol (LOPRESSOR) injection 5 mg  5 mg Intravenous Q6H  Vassie Loll, MD   5 mg at 07/07/12 0615  . metroNIDAZOLE (FLAGYL) IVPB 500 mg  500 mg Intravenous Q8H Vassie Loll, MD   500 mg at 07/07/12 0615  . morphine 4 MG/ML injection 3 mg  3 mg Intravenous Q6H PRN Vassie Loll, MD   3 mg at 07/05/12 2257  . ondansetron (ZOFRAN) injection 4 mg  4 mg Intravenous Q4H Jessica D. Zehr, PA-C   4 mg at 07/07/12 0417  . promethazine (PHENERGAN) injection 25 mg  25 mg Intravenous Q6H PRN Milford Cage, MD   25 mg at 07/03/12 0657  . sodium chloride 0.9 % injection 10-40 mL  10-40 mL Intracatheter PRN Milford Cage, MD   10 mL at 06/29/12 0351  . sodium chloride 0.9 % injection 10-40 mL  10-40 mL Intracatheter PRN Vassie Loll, MD   10 mL at 07/05/12 0554  . vancomycin (VANCOCIN) 500 mg in sodium chloride irrigation 0.9 % 100 mL ENEMA  500 mg Rectal Q6H Vassie Loll, MD   500 mg at 07/07/12 0615  . witch hazel-glycerin (TUCKS) pad   Topical PRN Arman Filter, NP        Assessment: s/p Procedure(s): ESOPHAGOGASTRODUODENOSCOPY (EGD)  Recent notes from GI and IM reviewed. Labs are remarkable for resolution of the hypokalemia and stable hyperglycemia.    He remains stable on current therapy but continues to have frequent stools.  No urologic issues today.   Plan: Continued management per GI and IM.  Labs to be repeated in AM.      LOS: 15 days    Tyanne Derocher J 07/07/2012

## 2012-07-07 NOTE — Progress Notes (Addendum)
Patient seen, examined, and I agree with the above documentation, including the assessment and plan. Prolonged ileus still with NG tube output, abdominal x-ray basically unchanged with ileus type pattern On TPN Recent diagnosis of C. difficile, on IV Flagyl and vancomycin enemas Check CBC tomorrow, closely follow electrolytes Continue ambulation and limited narcotics

## 2012-07-07 NOTE — Progress Notes (Signed)
[facility] PROGRESS NOTE  Chad LEHNER ZOX:[id number] DOB: 1945/08/25 DOA: 06/22/2012 PCP: [name], MD  Assessment/Plan: Nausea/Vomiting: Possible gastroparesis. S/P endoscopy 7-1 show: The mucosa of the esophagus appeared normal; likely esophageal Spasm. Fluid/food residue in the gastric fundus, gastric body, and gastric antrum consistent with gastroparesis (felt, at least in  part, to explain nausea). Now with positive C. Diff results; symptoms most likely due to this infection. -GI recommend Reglan QID, limit narcotics and continue  ambulation.  -continue antiemetics and Pepcid -continue tx for C. diff  Gastroparesis/iIleus/poor nutrition: No evidence of obstruction on CT scan bd x-ray on 7/2. -continue NPO status -continue NGT -continue reglan -will continue TNA for nutrition bridging -continue electrolytes repletion (Goal is K as close to 4 as possible) -no more vomiting and improved nausea -x-ray demonstrating no significant changes today -still high output on NGT  C. Diff colitis -due to ileus and inability to tolerate PO, qualify in complicated case of C. Diff by protocol. -will continue tx with vanc enemas and IV flagyl  Hx of DVT and bilateral PE :  -repeated CT scan hematoma stable. HB stable at 11. Patient was started on prophylaxis dose heparin.  -Recent Doppler of his lower extremity was negative for DVT.  -Repeated a CT angiogram of the chest which was negative for PE.  -Patient received 3 Month treatment for PE.  -Appreciate Dr Cyndie Chime recommendation. No indication for IVC filter.  -will continue heparin for DVT prophylaxis  Pelvic hematoma: Patient relates worsening abdominal pain. Hematoma stable by CT scan. No orther pathology by CT scan. Monitor hb on heparin. Abdominal pain multifactorial, including hematoma.  Fever with mild leucocytosis: Resolved.  Pelvic fluid collection Aspiration negative for bacteria.  Completed treatment with IV  vancomycin for MRSA bacteremia. (Vancomycin stopped 6-27).  Urine cx growing yeast, fluconazole stopped after 3 days tx.   Anemia  Received a total of 4 units PRBC during this admission. Hb stable. Last Hgb 7/2 12.5 -CBC in am  hemorrhoids  -General surgery consulted and recommended sitz bath and ice packs for now.  -continue anusol rectal creams.   Hypokalemia  -Will replete as needed and will try to maintain above 4 -some potassium also given with TNA -due GI losses  -will continue with aggressive repletion. -K 4.1 today, will monitor closely  Hypertension  Blood pressure stable. Will continue metoprolol IV  Hyperlipidemia  Continue holding statin for now and will resume when tolerating PO   bladder cancer: S/P surgery.  Treatment per primary team.  Loopogram negative for leak.    Code Status: Full Family Communication: wife and sister at bedside Disposition Plan: SNF at discharge  Antibiotics:  None currently  HPI/Subjective: Afebrile, no CP, no SOB. Feeling overall better; patient tolerating vanc enemas. Continue to participate with OOB to chair and short walking. Potassium 4.1 today  Objective: Filed Vitals:   07/06/12 1658 07/06/12 2023 07/07/12 0451 07/07/12 1220  BP: 131/84 132/82 136/92 121/91  Pulse: 86 86 97 105  Temp:  98.2 F (36.8 C) 97.7 F (36.5 C)   TempSrc:  Oral Oral   Resp:  20 18   Height:      Weight:      SpO2:  98% 98%     Intake/Output Summary (Last 24 hours) at 07/07/12 1431 Last data filed at 07/07/12 0900  Gross per 24 hour  Intake 2421.83 ml  Output   4500 ml  Net -2078.17 ml   Filed Weights   06/23/12 0405 06/23/12  0449  Weight: 103.2 kg (227 lb 8.2 oz) 100.109 kg (220 lb 11.2 oz)    Exam:   General:  Afebrile, reports improvement in his abd pain; also less nauseous; no more vomiting.  BM's continue  Cardiovascular: S1 and S2, no rubs or gallops  Respiratory: CTA bilaterally  Abdomen: softer, a lot less  distended, urostomy bag in place, mid surgical wound w/o signs or infection; less tender to palpation; decreased but positive BS  Musculoskeletal: trace edema bilaterally  Data Reviewed: Basic Metabolic Panel:  Recent Labs Lab 07/01/12 0455 07/03/12 0520 07/04/12 0400 07/05/12 0555 07/06/12 0450 07/07/12 0410  NA 135 136 137 136 137 138  K 4.2 3.3* 2.8* 2.9* 3.1* 4.1  CL 98 100 101 101 100 100  CO2 30 27 28 27 29 31   GLUCOSE 125* 139* 134* 144* 144* 169*  BUN 8 12 13 13 14 15   CREATININE 0.86 1.06 0.96 0.93 0.87 0.89  CALCIUM 9.3 8.9 8.6 8.6 8.9 9.3  MG  --  2.3 2.4 2.5 2.5  --   PHOS  --  3.5 3.1 3.3 3.8  --    CBC:  Recent Labs Lab 07/01/12 0455 07/03/12 0520  WBC  --  10.2  HGB 13.0 12.5*  HCT 40.7 38.9*  MCV  --  92.2  PLT  --  429*   CBG:  Recent Labs Lab 07/06/12 1948 07/07/12 0033 07/07/12 0408 07/07/12 0725 07/07/12 1138  GLUCAP 136* 150* 152* 144* 157*    Recent Results (from the past 240 hour(s))  CLOSTRIDIUM DIFFICILE BY PCR     Status: Abnormal   Collection Time    07/06/12 10:05 AM      Result Value Range Status   C difficile by pcr POSITIVE (*) NEGATIVE Final   Comment: CRITICAL RESULT CALLED TO, READ BACK BY AND VERIFIED WITH:     E.HARLESS,RN 1324 07/06/12 EHOWARD     Studies: Dg Abd 1 View  07/07/2012   *RADIOLOGY REPORT*  Clinical Data: Abdominal pain.  Follow up small bowel obstruction.  ABDOMEN - 1 VIEW  Comparison: 07/05/2012  Findings: Again noted are bilateral ureter stents that extend through the right abdominal ostomy.  There is also a left nephrostomy tube.  Again noted are dilated loops of bowel throughout the abdomen.  There appears to be both small and large bowel dilatation.  Surgical staples in the pelvis.  Limited evaluation for free air on the supine images.  Nasogastric tube is in the upper stomach.  IMPRESSION: Minimal change in the small and large bowel dilatation. Findings are compatible with an ileus pattern.   Original  Report Authenticated By: Richarda Overlie, M.D.    Scheduled Meds: . famotidine (PEPCID) IV  20 mg Intravenous Q24H  . hydrocortisone   Rectal TID  . insulin aspart  0-15 Units Subcutaneous Q4H  . metoCLOPramide (REGLAN) injection  10 mg Intravenous TID AC & HS  . metoprolol  5 mg Intravenous Q6H  . metronidazole  500 mg Intravenous Q8H  . ondansetron  4 mg Intravenous Q4H  . potassium chloride  10 mEq Intravenous Q1 Hr x 4  . vancomycin (VANCOCIN) rectal ENEMA  500 mg Rectal Q6H   Continuous Infusions: . 0.9 % NaCl with KCl 20 mEq / L 50 mL/hr at 07/07/12 0017  . Marland KitchenTPN (CLINIMIX-E) Adult 80 mL/hr at 07/06/12 1720   And  . fat emulsion 240 mL (07/06/12 1721)  . Marland KitchenTPN (CLINIMIX-E) Adult     And  . fat  emulsion      Principal Problem:   Pelvic hematoma, male Active Problems:   Bladder cancer   Acute pulmonary embolism (3/14)   DVT (deep venous thrombosis) 3/14   Fever   Bacteremia MRSA 5/14   VTE (venous thromboembolism)   Protein-calorie malnutrition, severe   Acute blood loss anemia   Gastroparesis    Time spent: < 30 minutes   Alekai Pocock  [facility] Pager [phone]. If 7PM-7AM, please contact night-coverage at [email or web address], password [facility] 07/07/2012, 2:31 PM  LOS: 15 days

## 2012-07-08 ENCOUNTER — Inpatient Hospital Stay (HOSPITAL_COMMUNITY): Payer: BC Managed Care – PPO

## 2012-07-08 LAB — CBC
Hemoglobin: 13.6 g/dL (ref 13.0–17.0)
MCH: 30.3 pg (ref 26.0–34.0)
MCHC: 32.5 g/dL (ref 30.0–36.0)
Platelets: 374 10*3/uL (ref 150–400)
RDW: 15.4 % (ref 11.5–15.5)

## 2012-07-08 LAB — COMPREHENSIVE METABOLIC PANEL
Albumin: 2.8 g/dL — ABNORMAL LOW (ref 3.5–5.2)
Alkaline Phosphatase: 76 U/L (ref 39–117)
BUN: 18 mg/dL (ref 6–23)
Chloride: 98 mEq/L (ref 96–112)
Creatinine, Ser: 0.88 mg/dL (ref 0.50–1.35)
GFR calc Af Amer: >90 mL/min (ref >90)
GFR calc non Af Amer: 88 mL/min — ABNORMAL LOW (ref >90)
Glucose, Bld: 149 mg/dL — ABNORMAL HIGH (ref 70–99)
Total Bilirubin: 0.5 mg/dL (ref 0.3–1.2)

## 2012-07-08 LAB — GLUCOSE, CAPILLARY
Glucose-Capillary: 139 mg/dL — ABNORMAL HIGH (ref 70–99)
Glucose-Capillary: 127 mg/dL — ABNORMAL HIGH (ref 70–99)
Glucose-Capillary: 131 mg/dL — ABNORMAL HIGH (ref 70–99)

## 2012-07-08 LAB — TRIGLYCERIDES: Triglycerides: 110 mg/dL (ref <150)

## 2012-07-08 LAB — PREALBUMIN: Prealbumin: 16.6 mg/dL — ABNORMAL LOW (ref 17.0–34.0)

## 2012-07-08 LAB — DIFFERENTIAL
Basophils Absolute: 0.1 10*3/uL (ref 0.0–0.1)
Basophils Relative: 1 % (ref 0–1)
Eosinophils Absolute: 0.3 10*3/uL (ref 0.0–0.7)
Neutro Abs: 8.4 10*3/uL — ABNORMAL HIGH (ref 1.7–7.7)
Neutrophils Relative %: 75 % (ref 43–77)

## 2012-07-08 LAB — PHOSPHORUS: Phosphorus: 4 mg/dL (ref 2.3–4.6)

## 2012-07-08 LAB — MAGNESIUM: Magnesium: 2.2 mg/dL (ref 1.5–2.5)

## 2012-07-08 MED ORDER — FAT EMULSION 20 % IV EMUL
240.0000 mL | INTRAVENOUS | Status: AC
  Administered 2012-07-08: 240 mL via INTRAVENOUS
  Filled 2012-07-08: qty 250

## 2012-07-08 MED ORDER — POTASSIUM CHLORIDE 10 MEQ/50ML IV SOLN
10.0000 meq | INTRAVENOUS | Status: AC
  Administered 2012-07-08 (×3): 10 meq via INTRAVENOUS
  Filled 2012-07-08 (×3): qty 50

## 2012-07-08 MED ORDER — IOHEXOL 300 MG/ML  SOLN: 50.0000 mL | Freq: Once | INTRAMUSCULAR | Status: AC | PRN

## 2012-07-08 MED ORDER — HEPARIN SODIUM (PORCINE) 5000 UNIT/ML IJ SOLN
5000.0000 [IU] | Freq: Three times a day (TID) | INTRAMUSCULAR | Status: DC
  Administered 2012-07-08 – 2012-08-09 (×96): 5000 [IU] via SUBCUTANEOUS
  Filled 2012-07-08 (×100): qty 1

## 2012-07-08 MED ORDER — TRACE MINERALS CR-CU-F-FE-I-MN-MO-SE-ZN IV SOLN
INTRAVENOUS | Status: AC
  Administered 2012-07-08: 17:00:00 via INTRAVENOUS
  Filled 2012-07-08: qty 2000

## 2012-07-08 NOTE — Progress Notes (Signed)
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 Healed well initially and sent home 6/17. Loopogram 6/27 without internal leak.  2 - Fever / h/o Bacteruria - Pt with MRSA in urine by hospital UCX 03/2012 and again subsequently in blood. Now on Vancomycin daily per ID x 4-6 week total (nearing end of course). Most recent UCX, BCX negative, New set pending from 6/22. Pt readmitted with low-grade fever 6/22 to 100.7 and malaise, no sig leukocytosis. Now on Vanc + Zosyn emirically. UCX with yeast (trated diflucan x 3 days), BCX no growth to date.Pelvic fluid aspirate and no growth / final.  ID now following,and DC'd all ABX given eval.  3 - Heme / Recent DVT + PE -incidental PE by CT 03/2012 now on Lovenox, DVT resolved by most recent LE duplex. Repeat CT-PE 6/23 without PE or active bleed from pelvis and Lovenox DC'd. Hgb drift to 7 6/24 and transfused 2 u with response to 8, transfused 2u additional 6/25 with increase to 10. Hematology consult [name]) also agrees no treatment dose anticoagulation at this point, but resume proph dosing since he is not ambulating much.  4 - Pelvic Fluid Collection - Pelvic fluid collection without rim-enhancement noted on CT from ER 6/22. Appears partially simple fluid with some dependent blood by HU analysis, no active extrav of blood or urine by imaging. IR aspiration performed 6/26 and fluid c/w hematoma only (not purulent, not simple/drainable).   5 - Hemorrhoids / Lower GI Bleed - Long h/o hemorroids. Pt with worsened / friable bleeding hemorroids on admission that are quite bothersome. No prior specific  intervention, likely exacerbated by pelvic fluid collection / hematoma. Gen Surg recs conservative measures.   6 - Nutrition / Nausea / C. Difficile- Pt with some element of baseline GI issues with GERD and prior small esophageal polyp. Has had exacerbation of nausea in house and not meeting nutritional goals. GI now involved and EGD + KUB with ileus / gastroparesis picture. No high-grade mechanical obstruction given continued stool + gas. While ileus resolving placed on TPN and NGT for symptom relief and to bridge him with nutrition. C. Diff found positive 7/5 and started on dual therapy with flagyl + vanc enemas with plan for 14 day course.   7 - Disposition - PT eval has recommended DC to SNF (v. LTAC) when appropriate and family amenable.  PMH sig for obesity and left knee replacement, PE + DVT (now resolved). No CV disease.   Today Sevag is feeling stable. Has been more ambulatory past 2 days. Remains afebrile.  Objective: Vital signs in last 24 hours: Temp:  [97.7 F (36.5 C)-97.9 F (36.6 C)] 97.7 F (36.5 C) (07/07 0430) Pulse Rate:  [89-105] 99 (07/07 0430) Resp:  [18-20] 18 (07/07 0430) BP: (121-149)/(87-93) 149/93 mmHg (07/07 0430) SpO2:  [95 %-97 %] 96 % (07/07 0430) Last BM Date: 07/07/12  Intake/Output from previous day: 07/06 0701 - 07/07 0700 In: 4699.8 [I.V.:1285.8; IV Piggyback:300; TPN:3114] Out: 3075 [Urine:1325; Emesis/NG output:1750] Intake/Output this shift:    General appearance: alert, cooperative and appears stated age Head: Normocephalic, without obvious abnormality, atraumatic Eyes: conjunctivae/corneas clear. PERRL, EOM's intact. Fundi benign. Ears: normal TM's and external ear canals both ears Nose: Nares normal. Septum midline. Mucosa  normal. No drainage or sinus tenderness. Throat: lips, mucosa, and tongue normal; teeth and gums normal Neck: no adenopathy, no carotid bruit, no JVD, supple, symmetrical, trachea midline and thyroid not enlarged,  symmetric, no tenderness/mass/nodules Back: symmetric, no curvature. ROM normal. No CVA tenderness. Resp: clear to auscultation bilaterally Chest wall: no tenderness Cardio: regular rate and rhythm, S1, S2 normal, no murmur, click, rub or gallop GI: stable abd distension, slightly more typanic rt hemi abdomen (improved.). NGT in place wtih output somewhat leess bilious. Male genitalia: normal, improving perineal ecchymoses Extremities: extremities normal, atraumatic, no cyanosis or edema and RUE PICC c/d/i. SCD's in place. Pulses: 2+ and symmetric Skin: Skin color, texture, turgor normal. No rashes or lesions Lymph nodes: Cervical, supraclavicular, and axillary nodes normal. Neurologic: Grossly normal Incision/Wound: Lower midline incision c/d/i. RLQ Urostomy pink / patent with Bander stents in place.  Lab Results:   Recent Labs  07/08/12 0443  WBC 11.2*  HGB 13.6  HCT 41.9  PLT 374   BMET  Recent Labs  07/07/12 0410 07/08/12 0443  NA 138 135  K 4.1 4.1  CL 100 98  CO2 31 29  GLUCOSE 169* 149*  BUN 15 18  CREATININE 0.89 0.88  CALCIUM 9.3 9.4   PT/INR No results found for this basename: LABPROT, INR,  in the last 72 hours ABG No results found for this basename: PHART, PCO2, PO2, HCO3,  in the last 72 hours  Studies/Results: Dg Abd 1 View  07/07/2012   *RADIOLOGY REPORT*  Clinical Data: Abdominal pain.  Follow up small bowel obstruction.  ABDOMEN - 1 VIEW  Comparison: 07/05/2012  Findings: Again noted are bilateral ureter stents that extend through the right abdominal ostomy.  There is also a left nephrostomy tube.  Again noted are dilated loops of bowel throughout the abdomen.  There appears to be both small and large bowel dilatation.  Surgical staples in the pelvis.  Limited evaluation for free air on the supine images.  Nasogastric tube is in the upper stomach.  IMPRESSION: Minimal change in the small and large bowel dilatation. Findings are compatible with an ileus  pattern.   Original Report Authenticated By: Richarda Overlie, M.D.    Anti-infectives: Anti-infectives   Start     Dose/Rate Route Frequency Ordered Stop   07/06/12 1800  vancomycin (VANCOCIN) 500 mg in sodium chloride irrigation 0.9 % 100 mL ENEMA     500 mg Rectal 4 times per day 07/06/12 1335 07/20/12 1759   07/06/12 1430  metroNIDAZOLE (FLAGYL) IVPB 500 mg     500 mg 100 mL/hr over 60 Minutes Intravenous 3 times per day 07/06/12 1335     06/25/12 1000  vancomycin (VANCOCIN) 1,250 mg in sodium chloride 0.9 % 250 mL IVPB  Status:  Discontinued     1,250 mg 166.7 mL/hr over 90 Minutes Intravenous Every 12 hours 06/25/12 0856 06/28/12 1102   06/24/12 2000  fluconazole (DIFLUCAN) IVPB 200 mg     200 mg 100 mL/hr over 60 Minutes Intravenous Every 24 hours 06/24/12 1733 06/26/12 2322   06/23/12 0900  vancomycin (VANCOCIN) 1,250 mg in sodium chloride 0.9 % 250 mL IVPB     1,250 mg 166.7 mL/hr over 90 Minutes Intravenous Every 12 hours 06/23/12 0522 06/24/12 2231   06/23/12 0600  piperacillin-tazobactam (ZOSYN) IVPB 3.375 g  Status:  Discontinued     3.375 g 12.5 mL/hr over 240 Minutes Intravenous 3 times per day 06/23/12 0447 06/28/12 1102   06/23/12 0330  cefTRIAXone (  ROCEPHIN) 1 g in dextrose 5 % 50 mL IVPB     1 g 100 mL/hr over 30 Minutes Intravenous  Once 06/23/12 0319 06/23/12 0440      Assessment/Plan:  1 - Bladder Cancer / Cystectomy / Ureteral Revision- NED form cancer perspective.Loopogram without leak this admission.  2 - Fever / h/o Bacteruria - Now off wound-specific ABX given negative CX from blood, urine, pelvic fluid.  3 - Heme / Recent DVT + PE -Lovenox now stopped. SCD's in place. STRONGLY encouraged OOB to chair and ambulation. . PT following. Remain proph SQ heparin + SCD's.  4 - Pelvic Fluid Collection - Imaging and aspirate c/w non-infected hemaotma. Size stable / decreasing as expected. Eplained to pt this will resolve, but slowly (weeks-mos).  5 - Hemorrhoids /  Lower GI Bleed - Agree with conservative measures for now. Hgb stable.  6 - Nutrition / Nausea / C. Difficile- Remains central active issue. Agree with aggressive treatment for C. Diff with continued NGT while high-output, though trending somewhat better. No fevers / leukocytosis.   7 - Disposition - SNF v. LTAC pending nutritional status.  Greatly appreciate hospitalist, gen surgery, ID, hematology,PT, GI, TPN pharmacy input.  [facility], Tavaughn Silguero 07/08/2012

## 2012-07-08 NOTE — Progress Notes (Signed)
PARENTERAL NUTRITION CONSULT NOTE - Follow-Up  Pharmacy Consult for TPN Indication: Gastroparesis/ileus  No Known Allergies  Patient Measurements: Height: 5\' 11"  (180.3 cm) Weight: 220 lb 11.2 oz (100.109 kg) IBW/kg (Calculated) : 75.3  Vital Signs: Temp: 97.7 F (36.5 C) (07/07 0430) Temp src: Oral (07/07 0430) BP: 149/93 mmHg (07/07 0430) Pulse Rate: 99 (07/07 0430) Intake/Output from previous day: 07/06 0701 - 07/07 0700 In: 4699.8 [I.V.:1285.8; IV Piggyback:300; TPN:3114] Out: 3075 [Urine:1325; Emesis/NG output:1750]  Labs:  Recent Labs  07/08/12 0443  WBC 11.2*  HGB 13.6  HCT 41.9  PLT 374     Recent Labs  07/06/12 0450 07/07/12 0410 07/08/12 0443  NA 137 138 135  K 3.1* 4.1 4.1  CL 100 100 98  CO2 29 31 29   GLUCOSE 144* 169* 149*  BUN 14 15 18   CREATININE 0.87 0.89 0.88  CALCIUM 8.9 9.3 9.4  MG 2.5  --  2.2  PHOS 3.8  --  4.0  PROT  --   --  6.8  ALBUMIN  --   --  2.8*  AST  --   --  16  ALT  --   --  12  ALKPHOS  --   --  76  BILITOT  --   --  0.5  TRIG  --   --  110   Estimated Creatinine Clearance: 99.5 ml/min (by C-G formula based on Cr of 0.88).    Recent Labs  07/07/12 2336 07/08/12 0319 07/08/12 0753  GLUCAP 137* 136* 146*    Insulin Requirements in the past 24 hours:  11 units Novolog sensitive SSI  Nutritional Goals:  RD recs: 1950-2100 Kcals, 80-95gm protein Clinimix 5/15 at a goal rate of  64ml/hr + 20% fat emulsion at 59ml/hr to provide: 96g/day protein, 1843Kcal/day.  Current Nutrition: NPO  IVF: NS with KCl 20 mEq/L at 50 ml/hr  Assessment: 67 YO M admitted 6/22 with abdominal pain s/p radical cystoprostatectomy for bladder cancer Feb 2014. On 06/15/12 he required ureteral reimplantation into lleal conduit. He has been unable to tolerate oral diet with persistent N/V, EGD done 7/1 revealed likely gastroparesis, abd films 7/2 consistent with ileus. NGT placed 7/2 with immediate brown-green drainage. Due to poor  nutritional intake and ileus, orders to start TPN 7/2 pm. 7/6 abdominal study shows minimal changes.  PMH: HTN, lipidemia, GERD, DM (diet controlled), bladder cancer   TPN day# 6   Glucose - controlled with SSI with values just below goal <150 mg/dL (except one reading of 157 yesterday afternoon); has h/o diet-controlled DM  Electrolytes - K= 4.1 after recent aggressive replacement. Per MD, goal K+ greater than 4.  Other lytes WNL.    Renal: SCr WNL.   NG output decreased ~ 50% yesterday. Patient now (+) C.diff.  LFTs - WNL (7/7)  TGs - 161 (7/3), 110 (7/7)  Prealbumin - 13.4 (7/3)  TPN Access: PICC   Plan: At 1800 today:  Continue Clinimix E 5/15 at goal rate of 78ml/hr to meet protein goal and 95% of kcal goal.    20% fat emulsion at 61ml/hr.  TNA to contain standard multivitamins and trace elements.  Continue IVF per MD.  Continue current SSI  TNA lab panels on Mondays & Thursdays.  F/u daily.  Darrol Angel, PharmD Pager: [phone] 07/08/2012 8:13 AM

## 2012-07-08 NOTE — Progress Notes (Signed)
Physical Therapy Treatment Patient Details Name: Chad Olson MRN: [id number] DOB: 1945/03/07 Today's Date: 07/08/2012 Time: 3086-5784 PT Time Calculation (min): 18 min  PT Assessment / Plan / Recommendation  PT Comments   Pt appears to be improving in balance and mobility.  He continues to be easily fatigued and needs encouragement for OOB.  Recommend continued short, frequents bouts of activity to increase endurance.  Follow Up Recommendations   (if pt continues to improve,may be OK to d/c home with HHPT)     Does the patient have the potential to tolerate intense rehabilitation     Barriers to Discharge        Equipment Recommendations  None recommended by PT    Recommendations for Other Services    Frequency Min 3X/week   Progress towards PT Goals Progress towards PT goals: Progressing toward goals  Plan Current plan remains appropriate    Precautions / Restrictions Precautions Precaution Comments: NG tube disconnected by RN Restrictions Weight Bearing Restrictions: No   Pertinent Vitals/Pain No c/o pain    Mobility  Bed Mobility Bed Mobility: Right Sidelying to Sit Right Sidelying to Sit: 4: Min assist;HOB elevated;With rails Details for Bed Mobility Assistance: cues to log roll and push up on bedrail to get to the edge of the bed Transfers Transfers: Sit to Stand;Stand to Sit Sit to Stand: 5: Supervision Stand to Sit: 5: Supervision Details for Transfer Assistance: repeated sit to stand x 5 times for strengthening.  No balance loss noted Ambulation/Gait General Gait Details: famiily reports pt has already walked this am.  He is too fatigued to walk after doing standing exercises and asks to be left sitting on EOB Stairs: No Wheelchair Mobility Wheelchair Mobility: No    Exercises General Exercises - Lower Extremity Ankle Circles/Pumps: AROM;Both;10 reps;Supine Short Arc Quad: AROM;Both;10 reps;Supine Hip Flexion/Marching: AROM;5 reps;Standing   PT  Diagnosis:    PT Problem List:   PT Treatment Interventions:     PT Goals (current goals can now be found in the care plan section)    Visit Information  Last PT Received On: 07/08/12 Assistance Needed: +1    Subjective Data      Cognition  Cognition Arousal/Alertness: Awake Behavior During Therapy: [facility] for tasks assessed/performed;Flat affect Overall Cognitive Status: Within Functional Limits for tasks assessed    Balance  Static Sitting Balance Static Sitting - Balance Support: No upper extremity supported Static Sitting - Level of Assistance: 7: Independent Static Standing Balance Static Standing - Balance Support: No upper extremity supported Static Standing - Level of Assistance: 5: Stand by assistance High Level Balance High Level Balance Comments: pt able to do  UE ROM and stepping activities in standing with no UE support and no loss of balance Pt too fatigued after standing exercise to walk.  He preferred to sit on EOB rather tha sit in recliner chair.  MD in room, RN notified  End of Session PT - End of Session Activity Tolerance: Patient limited by fatigue Patient left: in bed;with family/visitor present (sitting on EOB) Nurse Communication: Mobility status   GP    Bayard Hugger. [place], [place] [phone] 07/08/2012, 12:15 PM

## 2012-07-08 NOTE — Progress Notes (Signed)
CSW sent updated info to [facility]. CSW continues to follow for medical stability.  Chad Olson C. Calee Nugent MSW, LCSW [phone]

## 2012-07-08 NOTE — Progress Notes (Signed)
[facility] PROGRESS NOTE  Chad Olson:[id number] DOB: 03/03/1945 DOA: 06/22/2012 PCP: [name], MD  Assessment/Plan: Nausea/Vomiting: Possible gastroparesis. S/P endoscopy 7-1 show: The mucosa of the esophagus appeared normal; likely esophageal Spasm. Fluid/food residue in the gastric fundus, gastric body, and gastric antrum consistent with gastroparesis (felt, at least in  part, to explain nausea). Now with positive C. Diff results; symptoms most likely due to this infection. -GI recommend Reglan QID, limit narcotics and continue  ambulation.  -continue antiemetics and Pepcid -continue tx for C. diff  Gastroparesis/iIleus/poor nutrition: No evidence of obstruction on CT scan bd x-ray on 7/2. -continue NPO status -continue NGT -continue reglan -will continue TNA for nutrition bridging -Continue electrolytes repletion (Goal is K as close to 4 as possible) -no more vomiting and improved nausea -improved in NGT output in comparison to 07/07/12; will monitor and follow for decision on clamping trials and NGT removal.  C. Diff colitis -due to ileus and inability to tolerate PO, qualify in complicated case of C. Diff by protocol. -will continue tx with vanc enemas and IV flagyl  Hx of DVT and bilateral PE :  -repeated CT scan hematoma stable. HB stable at 11. Patient was started on prophylaxis dose heparin.  -Recent Doppler of his lower extremity was negative for DVT.  -Repeated a CT angiogram of the chest which was negative for PE.  -Patient received 3 Month treatment for PE.  -Appreciate Dr Cyndie Chime recommendation. No indication for IVC filter.  -will continue heparin for DVT prophylaxis  Pelvic hematoma: Patient relates worsening abdominal pain. Hematoma stable by CT scan. No orther pathology by CT scan. Monitor hb on heparin. Abdominal pain multifactorial, including hematoma. -will repeat CT abd to check for stability on 7/8  Fever with mild leucocytosis:   Pelvic fluid collection Aspiration negative for bacteria.  Completed treatment with IV vancomycin for MRSA bacteremia. (Vancomycin stopped 6-27).  Urine cx growing yeast, fluconazole stopped after 3 days tx.  -now receiving treatment for c. Diff. WBC 11.5 and no fever.  Anemia  Received a total of 4 units PRBC during this admission. Hb stable. Last Hgb 7/5 12.5  hemorrhoids  -General surgery consulted and recommended sitz bath and ice packs for now.  -continue anusol rectal creams.   Hypokalemia  -Will replete as needed and will try to maintain above 4 -some potassium also given with TNA -due GI losses  -will continue with aggressive repletion. -K 4.1 today, will monitor closely  Hypertension  Blood pressure stable. Will continue metoprolol IV  Hyperlipidemia  Continue holding statin for now and will resume when tolerating PO   Bladder cancer: S/P surgery.  Treatment per primary team.  Loopogram negative for leak.    Code Status: Full Family Communication: wife and sister at bedside Disposition Plan: SNF vs Home with [facility] services at discharge  Antibiotics:  Vancomycin enemas 07/06/12  IV flagyl 07/06/12  HPI/Subjective: Afebrile, no CP, no SOB. Feeling overall better and with less abd discomfort. Patient tolerating vanc enemas. Continue to participate with OOB to chair and short walking. Potassium stable at 4.1 today  Objective: Filed Vitals:   07/07/12 1449 07/07/12 1659 07/07/12 2021 07/08/12 0430  BP: 129/92 127/88 140/87 149/93  Pulse: 89 102 89 99  Temp: 97.9 F (36.6 C)  97.8 F (36.6 C) 97.7 F (36.5 C)  TempSrc: Oral  Oral Oral  Resp: 20  18 18   Height:      Weight:      SpO2: 97%  95%  96%    Intake/Output Summary (Last 24 hours) at 07/08/12 1326 Last data filed at 07/08/12 0855  Gross per 24 hour  Intake 4384.84 ml  Output   2475 ml  Net 1909.84 ml   Filed Weights   06/23/12 0405 06/23/12 0449  Weight: 103.2 kg (227 lb 8.2 oz) 100.109 kg (220  lb 11.2 oz)    Exam:   General:  Afebrile, reports improvement in his abd pain; also less nauseous; no more vomiting.  BM's continue but per patient and staff less frequent  Cardiovascular: S1 and S2, no rubs or gallops  Respiratory: CTA bilaterally  Abdomen: softer, a lot less distended, urostomy bag in place, mid surgical wound w/o signs or infection; less tender to palpation; decreased but positive BS  Musculoskeletal: trace edema bilaterally  Data Reviewed: Basic Metabolic Panel:  Recent Labs Lab 07/03/12 0520 07/04/12 0400 07/05/12 0555 07/06/12 0450 07/07/12 0410 07/08/12 0443  NA 136 137 136 137 138 135  K 3.3* 2.8* 2.9* 3.1* 4.1 4.1  CL 100 101 101 100 100 98  CO2 27 28 27 29 31 29   GLUCOSE 139* 134* 144* 144* 169* 149*  BUN 12 13 13 14 15 18   CREATININE 1.06 0.96 0.93 0.87 0.89 0.88  CALCIUM 8.9 8.6 8.6 8.9 9.3 9.4  MG 2.3 2.4 2.5 2.5  --  2.2  PHOS 3.5 3.1 3.3 3.8  --  4.0   CBC:  Recent Labs Lab 07/03/12 0520 07/08/12 0443  WBC 10.2 11.2*  NEUTROABS  --  8.4*  HGB 12.5* 13.6  HCT 38.9* 41.9  MCV 92.2 93.3  PLT 429* 374   CBG:  Recent Labs Lab 07/07/12 2011 07/07/12 2336 07/08/12 0319 07/08/12 0753 07/08/12 1229  GLUCAP 140* 137* 136* 146* 139*    Recent Results (from the past 240 hour(s))  CLOSTRIDIUM DIFFICILE BY PCR     Status: Abnormal   Collection Time    07/06/12 10:05 AM      Result Value Range Status   C difficile by pcr POSITIVE (*) NEGATIVE Final   Comment: CRITICAL RESULT CALLED TO, READ BACK BY AND VERIFIED WITH:     E.HARLESS,RN 1324 07/06/12 EHOWARD     Studies: Dg Abd 1 View  07/07/2012   *RADIOLOGY REPORT*  Clinical Data: Abdominal pain.  Follow up small bowel obstruction.  ABDOMEN - 1 VIEW  Comparison: 07/05/2012  Findings: Again noted are bilateral ureter stents that extend through the right abdominal ostomy.  There is also a left nephrostomy tube.  Again noted are dilated loops of bowel throughout the abdomen.  There  appears to be both small and large bowel dilatation.  Surgical staples in the pelvis.  Limited evaluation for free air on the supine images.  Nasogastric tube is in the upper stomach.  IMPRESSION: Minimal change in the small and large bowel dilatation. Findings are compatible with an ileus pattern.   Original Report Authenticated By: Richarda Overlie, M.D.    Scheduled Meds: . famotidine (PEPCID) IV  20 mg Intravenous Q24H  . heparin subcutaneous  5,000 Units Subcutaneous Q8H  . hydrocortisone   Rectal TID  . insulin aspart  0-15 Units Subcutaneous Q4H  . metoCLOPramide (REGLAN) injection  10 mg Intravenous TID AC & HS  . metoprolol  5 mg Intravenous Q6H  . metronidazole  500 mg Intravenous Q8H  . ondansetron  4 mg Intravenous Q4H  . potassium chloride  10 mEq Intravenous Q1 Hr x 4  . vancomycin (VANCOCIN) rectal ENEMA  500 mg Rectal Q6H   Continuous Infusions: . 0.9 % NaCl with KCl 20 mEq / L 50 mL/hr at 07/08/12 0008  . Marland KitchenTPN (CLINIMIX-E) Adult 80 mL/hr at 07/07/12 1751   And  . fat emulsion 240 mL (07/07/12 1752)  . Marland KitchenTPN (CLINIMIX-E) Adult     And  . fat emulsion      Principal Problem:   Pelvic hematoma, male Active Problems:   Bladder cancer   Acute pulmonary embolism (3/14)   DVT (deep venous thrombosis) 3/14   Fever   Bacteremia MRSA 5/14   VTE (venous thromboembolism)   Protein-calorie malnutrition, severe   Acute blood loss anemia   Gastroparesis    Time spent: < 30 minutes   Cherylynn Liszewski  [facility] Pager [phone]. If 7PM-7AM, please contact night-coverage at [email or web address], password [facility] 07/08/2012, 1:26 PM  LOS: 16 days

## 2012-07-08 NOTE — Progress Notes (Signed)
[facility] Progress Note    Since last GI note: Feeling "ok".  Didn't get out of bed yesterday, was too tired from sleepless night Saturday.  +smears of stool.  Objective: Vital signs in last 24 hours: Temp:  [97.7 F (36.5 C)-97.9 F (36.6 C)] 97.7 F (36.5 C) (07/07 0430) Pulse Rate:  [89-105] 99 (07/07 0430) Resp:  [18-20] 18 (07/07 0430) BP: (121-149)/(87-93) 149/93 mmHg (07/07 0430) SpO2:  [95 %-97 %] 96 % (07/07 0430) Last BM Date: 07/07/12 General: alert and oriented times 3 Heart: regular rate and rythm Abdomen: non-tender, ++distended and tympanic, normal bowel sounds  NG output: 1150cc in past 24 hours  Lab Results:  Recent Labs  07/08/12 0443  WBC 11.2*  HGB 13.6  PLT 374  MCV 93.3    Recent Labs  07/06/12 0450 07/07/12 0410 07/08/12 0443  NA 137 138 135  K 3.1* 4.1 4.1  CL 100 100 98  CO2 29 31 29   GLUCOSE 144* 169* 149*  BUN 14 15 18   CREATININE 0.87 0.89 0.88  CALCIUM 8.9 9.3 9.4    Recent Labs  07/08/12 0443  PROT 6.8  ALBUMIN 2.8*  AST 16  ALT 12  ALKPHOS 76  BILITOT 0.5    Medications: Scheduled Meds: . famotidine (PEPCID) IV  20 mg Intravenous Q24H  . heparin subcutaneous  5,000 Units Subcutaneous Q8H  . hydrocortisone   Rectal TID  . insulin aspart  0-15 Units Subcutaneous Q4H  . metoCLOPramide (REGLAN) injection  10 mg Intravenous TID AC & HS  . metoprolol  5 mg Intravenous Q6H  . metronidazole  500 mg Intravenous Q8H  . ondansetron  4 mg Intravenous Q4H  . vancomycin (VANCOCIN) rectal ENEMA  500 mg Rectal Q6H   Continuous Infusions: . 0.9 % NaCl with KCl 20 mEq / L 50 mL/hr at 07/08/12 0008  . Marland KitchenTPN (CLINIMIX-E) Adult 80 mL/hr at 07/07/12 1751   And  . fat emulsion 240 mL (07/07/12 1752)   PRN Meds:.morphine injection, promethazine, sodium chloride, sodium chloride, witch hazel-glycerin    Assessment/Plan: 67 y.o. male with ileus, c. Difficile +, pelvic hematoma  This is the first time I'v seen Mr.  Olson.  He has been having stool output but has ileus pattern on imaging and is c diff positive.  I don't know if the pelvic hematoma is exerting any anatomic issues, pressure on the rectum that is causing his bowels to be so slow to recover.  Last CT scan was about 2 weeks ago, will consider repeating it.  For now, continue ng tube, tpn.  I impressed on he and his wife that he has to ambulate at least tid and should be sitting up in chair as much as possible (only lay down in bed if he is sleeping).  He has stopped taking narcotic pain meds.      Chad Bunting, MD  07/08/2012, 7:34 AM [facility] Pager [phone]

## 2012-07-09 ENCOUNTER — Inpatient Hospital Stay (HOSPITAL_COMMUNITY): Payer: BC Managed Care – PPO

## 2012-07-09 LAB — GLUCOSE, CAPILLARY
Glucose-Capillary: 131 mg/dL — ABNORMAL HIGH (ref 70–99)
Glucose-Capillary: 131 mg/dL — ABNORMAL HIGH (ref 70–99)

## 2012-07-09 LAB — BASIC METABOLIC PANEL
Chloride: 101 mEq/L (ref 96–112)
GFR calc Af Amer: >90 mL/min (ref >90)
Potassium: 4 mEq/L (ref 3.5–5.1)

## 2012-07-09 MED ORDER — IOHEXOL 300 MG/ML  SOLN
25.0000 mL | INTRAMUSCULAR | Status: AC
  Administered 2012-07-09 (×2): 25 mL via ORAL

## 2012-07-09 MED ORDER — IOHEXOL 300 MG/ML  SOLN
100.0000 mL | Freq: Once | INTRAMUSCULAR | Status: AC | PRN
  Administered 2012-07-09: 100 mL via INTRAVENOUS

## 2012-07-09 MED ORDER — TRACE MINERALS CR-CU-F-FE-I-MN-MO-SE-ZN IV SOLN
INTRAVENOUS | Status: AC
  Administered 2012-07-09: 18:00:00 via INTRAVENOUS
  Filled 2012-07-09: qty 2000

## 2012-07-09 MED ORDER — POTASSIUM CHLORIDE 10 MEQ/50ML IV SOLN
10.0000 meq | INTRAVENOUS | Status: AC
Start: 2012-07-09 — End: 2012-07-10
  Administered 2012-07-09 – 2012-07-10 (×3): 10 meq via INTRAVENOUS
  Filled 2012-07-09 (×3): qty 50

## 2012-07-09 MED ORDER — FAT EMULSION 20 % IV EMUL
240.0000 mL | INTRAVENOUS | Status: AC
  Administered 2012-07-09: 240 mL via INTRAVENOUS
  Filled 2012-07-09: qty 250

## 2012-07-09 NOTE — Progress Notes (Signed)
PARENTERAL NUTRITION CONSULT NOTE - Follow-Up  Pharmacy Consult for TPN Indication: Gastroparesis/ileus  No Known Allergies  Patient Measurements: Height: 5\' 11"  (180.3 cm) Weight: 220 lb 11.2 oz (100.109 kg) IBW/kg (Calculated) : 75.3  Vital Signs: Temp: 98 F (36.7 C) (07/08 0529) Temp src: Oral (07/08 0529) BP: 130/85 mmHg (07/08 0529) Pulse Rate: 87 (07/08 0529) Intake/Output from previous day: 07/07 0701 - 07/08 0700 In: 4190 [I.V.:1150; NG/GT:240; IV Piggyback:550; TPN:2250] Out: 2775 [Urine:2025; Emesis/NG output:750]  Labs:  Recent Labs  07/08/12 0443  WBC 11.2*  HGB 13.6  HCT 41.9  PLT 374     Recent Labs  07/07/12 0410 07/08/12 0443 07/09/12 0425  NA 138 135 134*  K 4.1 4.1 4.0  CL 100 98 101  CO2 31 29 27   GLUCOSE 169* 149* 147*  BUN 15 18 17   CREATININE 0.89 0.88 0.86  CALCIUM 9.3 9.4 8.9  MG  --  2.2  --   PHOS  --  4.0  --   PROT  --  6.8  --   ALBUMIN  --  2.8*  --   AST  --  16  --   ALT  --  12  --   ALKPHOS  --  76  --   BILITOT  --  0.5  --   PREALBUMIN  --  16.6*  --   TRIG  --  110  --    Estimated Creatinine Clearance: 101.8 ml/min (by C-G formula based on Cr of 0.86).    Recent Labs  07/08/12 2341 07/09/12 0358 07/09/12 0748  GLUCAP 117* 131* 131*    Insulin Requirements in the past 24 hours:  12 units Novolog sensitive SSI  Nutritional Goals:  RD recs: 1950-2100 Kcals, 80-95gm protein Clinimix 5/15 at a goal rate of  71ml/hr + 20% fat emulsion at 61ml/hr to provide: 96g/day protein, 1843Kcal/day.  Current Nutrition: NPO  IVF: NS with KCl 20 mEq/L at 50 ml/hr  Assessment: 67 YO M admitted 6/22 with abdominal pain s/p radical cystoprostatectomy for bladder cancer Feb 2014. On 06/15/12 he required ureteral reimplantation into lleal conduit. He has been unable to tolerate oral diet with persistent N/V, EGD done 7/1 revealed likely gastroparesis, abd films 7/2 consistent with ileus. NGT placed 7/2 with immediate  brown-green drainage. Due to poor nutritional intake and ileus, orders to start TPN 7/2 pm. 7/6 abdominal study shows minimal changes.  PMH: HTN, lipidemia, GERD, DM (diet controlled), bladder cancer   TPN day# 7   Glucose - controlled with SSI with values just below goal <150 mg/dL. Has h/o diet-controlled DM  Electrolytes - K= 4.0 after recent aggressive replacement. Per MD, goal K+ greater than 4.  Na slightly low (unable to change Na-content of premixed TNA)   Renal: SCr WNL.   NG output decreased ~ 50-60% again yesterday. Patient now (+) C.diff.  LFTs - WNL (7/7)  TGs - 161 (7/3), 110 (7/7)  Prealbumin - 13.4 (7/3)  TPN Access: PICC   Plan: At 1800 today:  Continue Clinimix E 5/15 at goal rate of 77ml/hr to meet protein goal and 95% of kcal goal.    20% fat emulsion at 28ml/hr.  TNA to contain standard multivitamins and trace elements.  Continue IVF per MD.  Continue current SSI  TNA lab panels on Mondays & Thursdays.  F/u daily.  Darrol Angel, PharmD Pager: [phone] 07/09/2012 10:09 AM

## 2012-07-09 NOTE — Progress Notes (Signed)
Physical Therapy Treatment Patient Details Name: Chad [facility] MRN: [id number] DOB: January 18, 1945 Today's Date: 07/09/2012 Time: 6213-0865 PT Time Calculation (min): 27 min  PT Assessment / Plan / Recommendation  PT Comments   Pt continues daily progress in mobility and gait distance.  Follow Up Recommendations  SNF     Does the patient have the potential to tolerate intense rehabilitation     Barriers to Discharge        Equipment Recommendations  None recommended by PT    Recommendations for Other Services    Frequency     Progress towards PT Goals Progress towards PT goals: Progressing toward goals  Plan Current plan remains appropriate    Precautions / Restrictions Precautions Precautions: Fall Precaution Comments: NG tube disconnected by RN Restrictions Weight Bearing Restrictions: No   Pertinent Vitals/Pain Little c/o pain    Mobility  Bed Mobility Bed Mobility: Right Sidelying to Sit Right Sidelying to Sit: 4: Min assist;HOB elevated;With rails Details for Bed Mobility Assistance: assist to push up onto elbow and into sitting from sidelying Transfers Transfers: Sit to Stand;Stand to Sit Sit to Stand: 5: Supervision;From elevated surface Stand to Sit: 5: Supervision Details for Transfer Assistance: repeated x3  Ambulation/Gait Ambulation/Gait Assistance: 4: Min guard Ambulation Distance (Feet): 150 Feet Assistive device: Rolling walker Ambulation/Gait Assistance Details: assist for IV pole.  Pt more comfortable with depends in place due to [facility] incontinence of stool Gait Pattern: Step-through pattern;Decreased stride length;Wide base of support;Trunk flexed Gait velocity: decreased General Gait Details: pt more erect and increased distance of gait.  He does admit that legs feel "trembly" after walking Stairs: No Wheelchair Mobility Wheelchair Mobility: No    Exercises General Exercises - Lower Extremity Ankle Circles/Pumps: AROM;Both;10  reps;Supine Short Arc Quad: Supine Hip Flexion/Marching: AROM;5 reps;Supine (just lifting legs off bed to activate abd) Other Exercises Other Exercises: alternate UE elevationa  Other Exercises: opposite alternate UE and LE elevation Other Exercises: bilater UE hip to hip and ear to ear to activate core   PT Diagnosis:    PT Problem List:   PT Treatment Interventions:     PT Goals (current goals can now be found in the care plan section)    Visit Information  Last PT Received On: 07/09/12 Assistance Needed: +1 History of Present Illness: pt continues with NG tube, foley, IV    Subjective Data      Cognition  Cognition Arousal/Alertness: Awake/alert Behavior During Therapy: WFL for tasks assessed/performed (pt smiling and joking some) Overall Cognitive Status: Within Functional Limits for tasks assessed    Balance     End of Session PT - End of Session Activity Tolerance: Patient tolerated treatment well Patient left: in chair;with nursing/sitter in room;with family/visitor present Nurse Communication: Mobility status   GP     Donnetta Hail 07/09/2012, 2:23 PM

## 2012-07-09 NOTE — Progress Notes (Signed)
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 Healed well initially and sent home 6/17. Loopogram 6/27 without internal leak.  2 - Fever / h/o Bacteruria - Pt with MRSA in urine by hospital UCX 03/2012 and again subsequently in blood. Now on Vancomycin daily per ID x 4-6 week total (nearing end of course). Most recent UCX, BCX negative, New set pending from 6/22. Pt readmitted with low-grade fever 6/22 to 100.7 and malaise, no sig leukocytosis. Now on Vanc + Zosyn emirically. UCX with yeast (trated diflucan x 3 days), BCX no growth to date.Pelvic fluid aspirate and no growth / final.  ID now following,and DC'd all ABX given eval except for current C. Diff treatment.  3 - Heme / Recent DVT + PE -incidental PE by CT 03/2012 now on Lovenox, DVT resolved by most recent LE duplex. Repeat CT-PE 6/23 without PE or active bleed from pelvis and Lovenox DC'd. Hgb drift to 7 6/24 and transfused 2 u with response to 8, transfused 2u additional 6/25 with increase to 10. Hematology consult [name]) also agrees no treatment dose anticoagulation at this point, but resume proph dosing since he is not ambulating much.  4 - Pelvic Fluid Collection - Pelvic fluid collection without rim-enhancement noted on CT from ER 6/22. Appears partially simple fluid with some dependent blood by HU analysis, no active extrav of blood or urine by imaging. IR aspiration performed 6/26 and fluid c/w hematoma only (not purulent, not simple/drainable).   5 - Hemorrhoids / Lower GI Bleed - Long h/o hemorroids. Pt with worsened / friable bleeding hemorroids on admission that  are quite bothersome. No prior specific intervention, likely exacerbated by pelvic fluid collection / hematoma. Gen Surg recs conservative measures.   6 - Nutrition / Nausea / C. Difficile- Pt with some element of baseline GI issues with GERD and prior small esophageal polyp. Has had exacerbation of nausea in house and not meeting nutritional goals. GI now involved and EGD + KUB with ileus / gastroparesis picture. No high-grade mechanical obstruction given continued stool + gas. While ileus resolving placed on TPN and NGT for symptom relief and to bridge him with nutrition. C. Diff found positive 7/5 and started on dual therapy with flagyl + vanc enemas with plan for 14 day course.   7 - Disposition - PT eval has recommended DC to SNF (v. LTAC) when appropriate and family amenable.  PMH sig for obesity and left knee replacement, PE + DVT (now resolved). No CV disease.   Today Vi is feeling stronger. Did well with PT and amulation yesterday. NGT output and stool frequency continue to trend down. Abd with sig less distension.  Objective: Vital signs in last 24 hours: Temp:  [97.6 F (36.4 C)-98 F (36.7 C)] 98 F (36.7 C) (07/08 0529) Pulse Rate:  [87-92] 87 (07/08 0529) Resp:  [16-24] 16 (07/08 0529) BP: (129-139)/(85-90) 130/85 mmHg (07/08 0529) SpO2:  [96 %] 96 % (07/08 0529) Last BM Date: 07/08/12  Intake/Output from previous day: 07/07 0701 - 07/08 0700 In: 4190 [I.V.:1150; NG/GT:240; IV Piggyback:550; TPN:2250] Out: 2775 [Urine:2025; Emesis/NG output:750] Intake/Output this shift:    General appearance: alert, cooperative, appears stated age and family at bedside Head: Normocephalic, without obvious abnormality, atraumatic Eyes: conjunctivae/corneas  clear. PERRL, EOM's intact. Fundi benign. Ears: normal TM's and external ear canals both ears Nose: Nares normal. Septum midline. Mucosa normal. No drainage or sinus tenderness., NGT wtih bilious output, decreased Throat: lips,  mucosa, and tongue normal; teeth and gums normal Neck: no adenopathy, no carotid bruit, no JVD, supple, symmetrical, trachea midline and thyroid not enlarged, symmetric, no tenderness/mass/nodules Back: symmetric, no curvature. ROM normal. No CVA tenderness. Resp: clear to auscultation bilaterally Chest wall: no tenderness Cardio: regular rate and rhythm, S1, S2 normal, no murmur, click, rub or gallop GI: soft, non-tender; bowel sounds normal; no masses,  no organomegaly and Decreased distension and tympanic gas.  Male genitalia: normal, decreasing perineal ecchymoses Extremities: extremities normal, atraumatic, no cyanosis or edema and SCD's in place RUE PICC c/d/i. Pulses: 2+ and symmetric Skin: Skin color, texture, turgor normal. No rashes or lesions Lymph nodes: Cervical, supraclavicular, and axillary nodes normal. Neurologic: Grossly normal Incision/Wound: Lower midline incision c/d/i, RLQ Urostomy pink / patent with bander stents in place and clear urine.  Lab Results:   Recent Labs  07/08/12 0443  WBC 11.2*  HGB 13.6  HCT 41.9  PLT 374   BMET  Recent Labs  07/08/12 0443 07/09/12 0425  NA 135 134*  K 4.1 4.0  CL 98 101  CO2 29 27  GLUCOSE 149* 147*  BUN 18 17  CREATININE 0.88 0.86  CALCIUM 9.4 8.9   PT/INR No results found for this basename: LABPROT, INR,  in the last 72 hours ABG No results found for this basename: PHART, PCO2, PO2, HCO3,  in the last 72 hours  Studies/Results: No results found.  Anti-infectives: Anti-infectives   Start     Dose/Rate Route Frequency Ordered Stop   07/06/12 1800  vancomycin (VANCOCIN) 500 mg in sodium chloride irrigation 0.9 % 100 mL ENEMA     500 mg Rectal 4 times per day 07/06/12 1335 07/20/12 1759   07/06/12 1430  metroNIDAZOLE (FLAGYL) IVPB 500 mg     500 mg 100 mL/hr over 60 Minutes Intravenous 3 times per day 07/06/12 1335     06/25/12 1000  vancomycin (VANCOCIN) 1,250 mg in sodium chloride 0.9 % 250 mL IVPB   Status:  Discontinued     1,250 mg 166.7 mL/hr over 90 Minutes Intravenous Every 12 hours 06/25/12 0856 06/28/12 1102   06/24/12 2000  fluconazole (DIFLUCAN) IVPB 200 mg     200 mg 100 mL/hr over 60 Minutes Intravenous Every 24 hours 06/24/12 1733 06/26/12 2322   06/23/12 0900  vancomycin (VANCOCIN) 1,250 mg in sodium chloride 0.9 % 250 mL IVPB     1,250 mg 166.7 mL/hr over 90 Minutes Intravenous Every 12 hours 06/23/12 0522 06/24/12 2231   06/23/12 0600  piperacillin-tazobactam (ZOSYN) IVPB 3.375 g  Status:  Discontinued     3.375 g 12.5 mL/hr over 240 Minutes Intravenous 3 times per day 06/23/12 0447 06/28/12 1102   06/23/12 0330  cefTRIAXone (ROCEPHIN) 1 g in dextrose 5 % 50 mL IVPB     1 g 100 mL/hr over 30 Minutes Intravenous  Once 06/23/12 0319 06/23/12 0440      Assessment/Plan:  1 - Bladder Cancer / Cystectomy / Ureteral Revision- NED form cancer perspective.Loopogram without leak this admission.  2 - Fever / h/o Bacteruria - Now off wound-specific ABX given negative CX from blood, urine, pelvic fluid.  3 - Heme / Recent DVT + PE -Lovenox now stopped. SCD's in place. STRONGLY encouraged OOB to chair and ambulation. Marland Kitchen PT  following. Remain proph SQ heparin + SCD's.  4 - Pelvic Fluid Collection - Imaging and aspirate c/w non-infected hemaotma. Size stable / decreasing as expected. Eplained to pt this will resolve, but slowly (weeks-mos).  5 - Hemorrhoids / Lower GI Bleed - Agree with conservative measures for now. Hgb stable.  6 - Nutrition / Nausea / C. Difficile- Remains central active issue. Agree with aggressive treatment for C. Diff with continued NGT while high-output, though trending somewhat better. No fevers / leukocytosis. Likely clamping trial on NGT soon, per GI.   7 - Disposition - SNF v. LTAC pending nutritional status. Given progress, hopefully SNF.  Greatly appreciate hospitalist, gen surgery, ID, hematology,PT, GI, TPN pharmacy input.  [facility],  Kaytlynn Kochan 07/09/2012

## 2012-07-09 NOTE — Progress Notes (Signed)
[facility] PROGRESS NOTE  SEBASTIN PERLMUTTER ZOX:[id number] DOB: Mar 20, 1945 DOA: 06/22/2012 PCP: [name], MD  Brief hx 67 year old male with history of urothelial carcinoma status post robotic cystoprostatectomy in February this year with Intra- corporeal ileal conduit urinary diversion . Patient however developed left he hated it he can and right ear detail stricture and then underwent bilateral ureteral conduit anastomosis 2 weeks back and was discharged home. Patient also was admitted for MRSA bacteremia in May which was thought in the setting of chronic indwelling left nephrostomy tube and double-J stents. He had a PICC line placed with plan for treatment with IV vancomycin for 4-6 weeks. He is suppose to complete the antibiotic course tomorrow. Patient to return to the ED yesterday with malaise and a fever of 100.7 at home. He denies any nausea, vomiting, abdominal pain, chills, headache, blurred vision, chest pain, palpitations or shortness of breath. Reports good appetite.  Patient was also diagnosed of lower extremity DVT and bilateral PE in March of this year and began treatment with therapeutic Lovenox. We do not have any imaging in the system and this is based on provider notes from March.  He however has noticed increased hemorrhoidal bleeding since that time he has been on Lovenox for anticoagulation. A CT of the abdomen and pelvis done this admission showed large pelvic fluid collection suggestive hematoma which cannot out early abscess.  [facility] consulted for management of anticoagulation issue and duration treatment, issues with worsened hemorrhoids and unexplained fever. Patient nausea, vomiting, decrease appetite and abd distension. Currently pelvic hematoma is stable and only receiving heparin for DVT prophylaxis. Hgb demonstrated no further bleeding. Was found with gastroparesis, GERD, ileus and C. Diff gastroenteritis. Receiving vanc enemas and IV flagyl. Started on  TNA for nutrition bridging.   Assessment/Plan: Nausea/Vomiting: Possible gastroparesis. S/P endoscopy 7-1 show: The mucosa of the esophagus appeared normal; likely esophageal Spasm. Fluid/food residue in the gastric fundus, gastric body, and gastric antrum consistent with gastroparesis (felt, at least in  part, to explain nausea). Now with positive C. Diff results; symptoms most likely due to this infection. -GI recommend Reglan QID, limit narcotics and continue  ambulation.  -continue antiemetics and Pepcid -continue tx for C. diff  Gastroparesis/iIleus/poor nutrition: No evidence of obstruction on CT scan bd x-ray on 7/2. -continue NPO status -continue NGT -continue reglan -will continue TNA for nutrition bridging -Continue electrolytes repletion (Goal is K as close to 4 as possible) -no more vomiting and improved nausea -improved in NGT output in comparison to 07/08/12; will monitor and follow for decision on clamping trials and NGT removal.  C. Diff colitis -due to ileus and inability to tolerate PO, qualify in complicated case of C. Diff by protocol. -will continue tx with vanc enemas and IV flagyl -transition to PO regimen once able to tolerate PO and NGT is discontinue, hopefully in 2-3 days  Hx of DVT and bilateral PE :  -repeated CT scan hematoma stable. HB stable at 11. Patient was started on prophylaxis dose heparin.  -Recent Doppler of his lower extremity was negative for DVT.  -Repeated a CT angiogram of the chest which was negative for PE.  -Patient received 3 Month treatment for PE.  -Appreciate Dr Cyndie Chime recommendation. No indication for IVC filter.  -will continue heparin for DVT prophylaxis  Pelvic hematoma: Patient relates worsening abdominal pain. Hematoma stable by CT scan. No orther pathology by CT scan. Monitor hb on heparin. Abdominal pain multifactorial, including hematoma. -repeated CT abd to check  for stability on 7/8 demonstrated no increase in size and  no significant compromise with intestine.  Fever with mild leucocytosis:  Pelvic fluid collection Aspiration negative for bacteria.  Completed treatment with IV vancomycin for MRSA bacteremia. (Vancomycin stopped 6-27).  Urine cx growing yeast, fluconazole stopped after 3 days tx.  -now receiving treatment for c. Diff. WBC 11.5 and no fever.  Anemia  Received a total of 4 units PRBC during this admission. Hb stable. Last Hgb 7/7 13.6  hemorrhoids  -General surgery consulted and recommended sitz bath and ice packs for now.  -continue anusol rectal creams.   Hypokalemia  -Will replete as needed and will try to maintain above 4 -some potassium also given with TNA -due GI losses  -will continue with aggressive repletion. -K 4.0 today, will monitor closely  Hypertension  Blood pressure stable. Will continue metoprolol IV  Hyperlipidemia  Continue holding statin for now and will resume when tolerating PO   Bladder cancer: S/P surgery.  Treatment per primary team.  Loopogram negative for leak.    Code Status: Full Family Communication: wife and sister at bedside Disposition Plan: SNF vs Home with [facility] services at discharge  Antibiotics:  Vancomycin enemas 07/06/12  IV flagyl 07/06/12  HPI/Subjective: Afebrile, no CP, no SOB. Feeling overall better; after clamping NGT felt some abd discomfort and distension. Patient tolerating vanc enemas w/o problems. Continue to participate with PT. Potassium stable at 4.0 today  Objective: Filed Vitals:   07/08/12 2135 07/09/12 0529 07/09/12 1300 07/09/12 2105  BP: 139/90 130/85 113/84 122/84  Pulse: 92 87 86 79  Temp: 97.6 F (36.4 C) 98 F (36.7 C) 98.3 F (36.8 C) 98 F (36.7 C)  TempSrc: Oral Oral Oral Oral  Resp: 24 16 18 18   Height:      Weight:      SpO2: 96% 96% 98% 97%    Intake/Output Summary (Last 24 hours) at 07/09/12 2116 Last data filed at 07/09/12 1919  Gross per 24 hour  Intake   2570 ml  Output   2945 ml  Net    -375 ml   Filed Weights   06/23/12 0405 06/23/12 0449  Weight: 103.2 kg (227 lb 8.2 oz) 100.109 kg (220 lb 11.2 oz)    Exam:   General:  Afebrile, reports improvement in his abd pain; also less nauseous; no more vomiting.  BM's continue but per patient and staff less frequent  Cardiovascular: S1 and S2, no rubs or gallops  Respiratory: CTA bilaterally  Abdomen: softer, a lot less distended, urostomy bag in place, mid surgical wound w/o signs or infection; less tender to palpation; decreased but positive BS  Musculoskeletal: trace edema bilaterally  Data Reviewed: Basic Metabolic Panel:  Recent Labs Lab 07/03/12 0520 07/04/12 0400 07/05/12 0555 07/06/12 0450 07/07/12 0410 07/08/12 0443 07/09/12 0425  NA 136 137 136 137 138 135 134*  K 3.3* 2.8* 2.9* 3.1* 4.1 4.1 4.0  CL 100 101 101 100 100 98 101  CO2 27 28 27 29 31 29 27   GLUCOSE 139* 134* 144* 144* 169* 149* 147*  BUN 12 13 13 14 15 18 17   CREATININE 1.06 0.96 0.93 0.87 0.89 0.88 0.86  CALCIUM 8.9 8.6 8.6 8.9 9.3 9.4 8.9  MG 2.3 2.4 2.5 2.5  --  2.2  --   PHOS 3.5 3.1 3.3 3.8  --  4.0  --    CBC:  Recent Labs Lab 07/03/12 0520 07/08/12 0443  WBC 10.2 11.2*  NEUTROABS  --  8.4*  HGB 12.5* 13.6  HCT 38.9* 41.9  MCV 92.2 93.3  PLT 429* 374   CBG:  Recent Labs Lab 07/09/12 0358 07/09/12 0748 07/09/12 1159 07/09/12 1650 07/09/12 1959  GLUCAP 131* 131* 122* 122* 112*    Recent Results (from the past 240 hour(s))  CLOSTRIDIUM DIFFICILE BY PCR     Status: Abnormal   Collection Time    07/06/12 10:05 AM      Result Value Range Status   C difficile by pcr POSITIVE (*) NEGATIVE Final   Comment: CRITICAL RESULT CALLED TO, READ BACK BY AND VERIFIED WITH:     E.HARLESS,RN 1324 07/06/12 EHOWARD     Studies: Ct Abdomen Pelvis W Contrast  07/09/2012   *RADIOLOGY REPORT*  Clinical Data: Diffuse abdominal pain.  Ileus.  Pelvic hematoma. Nausea.  Fever.  Abdominal distention.  CT ABDOMEN AND PELVIS WITH  CONTRAST  Technique:  Multidetector CT imaging of the abdomen and pelvis was performed following the standard protocol during bolus administration of intravenous contrast.  Contrast: OMNIPAQUE IOHEXOL 300 MG/ML  SOLN  Comparison: 06/27/2012  Findings: Postop changes again seen from prior cystectomy and right lower quadrant ileostomy. Bilateral internal ureteral stents remain in place and there is no evidence of hydronephrosis.  A left percutaneous nephrostomy tube also remains in place.  Small bilateral renal cysts are stable and no renal masses are identified.  A central pelvic hematoma shows decreased attenuation since prior study, consistent with expected evolution.  This hematoma measures 8.2 x 13.1 cm and is not significant changed in size.  Diffuse distention of small bowel and colon is seen, consistent with a diffuse ileus which is increased since previous study.  No transition point noted.  No evidence of bowel wall thickening.  The liver, pancreas, spleen, and adrenal glands are normal appearance.  Tiny calcified gallstones are noted, however there is no evidence of cholecystitis or biliary dilatation.  IMPRESSION:  1.  13 cm pelvic hematoma shows decreased attenuation but no significant change in size since previous exam. 2.  Increased diffuse ileus pattern. 3. Cholelithiasis.  No radiographic evidence of cholecystitis.   Original Report Authenticated By: Myles Rosenthal, M.D.    Scheduled Meds: . famotidine (PEPCID) IV  20 mg Intravenous Q24H  . heparin subcutaneous  5,000 Units Subcutaneous Q8H  . hydrocortisone   Rectal TID  . insulin aspart  0-15 Units Subcutaneous Q4H  . metoCLOPramide (REGLAN) injection  10 mg Intravenous TID AC & HS  . metoprolol  5 mg Intravenous Q6H  . metronidazole  500 mg Intravenous Q8H  . ondansetron  4 mg Intravenous Q4H  . potassium chloride  10 mEq Intravenous Q1 Hr x 3  . vancomycin (VANCOCIN) rectal ENEMA  500 mg Rectal Q6H   Continuous Infusions: . 0.9 %  NaCl with KCl 20 mEq / L 50 mL/hr at 07/09/12 2037  . Marland KitchenTPN (CLINIMIX-E) Adult 80 mL/hr at 07/09/12 1737   And  . fat emulsion 240 mL (07/09/12 1738)    Principal Problem:   Pelvic hematoma, male Active Problems:   Bladder cancer   Acute pulmonary embolism (3/14)   DVT (deep venous thrombosis) 3/14   Fever   Bacteremia MRSA 5/14   VTE (venous thromboembolism)   Protein-calorie malnutrition, severe   Acute blood loss anemia   Gastroparesis    Time spent: < 30 minutes   Santino Kinsella  [facility] Pager [phone]. If 7PM-7AM, please contact night-coverage at [email or web address], password [facility] 07/09/2012, 9:16 PM  LOS: 17  days

## 2012-07-09 NOTE — Progress Notes (Signed)
Patient ID: Chad Olson, male   DOB: 22-Jul-1945, 67 y.o.   MRN: [id number] [facility] Progress Note  Subjective: Feels  stronger. Denies nausea or abdominal pain. Feels fine with NG clamped. No flatus or stools x 2 days. For follow up Ct this am  Objective:  Vital signs in last 24 hours: Temp:  [97.6 F (36.4 C)-98 F (36.7 C)] 98 F (36.7 C) (07/08 0529) Pulse Rate:  [87-92] 87 (07/08 0529) Resp:  [16-24] 16 (07/08 0529) BP: (129-139)/(85-90) 130/85 mmHg (07/08 0529) SpO2:  [96 %] 96 % (07/08 0529) Last BM Date: 07/08/12 General:   Alert,  Well-developed, WM   in NAD Heart:  Regular rate and rhythm; no murmurs Pulm;clear Abdomen:  Soft, nontender and mildly distended. BS present- somewhat hypoactive Neurologic:  Alert and  oriented x4;  grossly normal neurologically. Psych:  Alert and cooperative. Normal mood and affect.  Intake/Output from previous day: 07/07 0701 - 07/08 0700 In: 4190 [I.V.:1150; NG/GT:240; IV Piggyback:550; TPN:2250] Out: 2775 [Urine:2025; Emesis/NG output:750]    Lab Results:  Recent Labs  07/08/12 0443  WBC 11.2*  HGB 13.6  HCT 41.9  PLT 374   BMET  Recent Labs  07/07/12 0410 07/08/12 0443 07/09/12 0425  NA 138 135 134*  K 4.1 4.1 4.0  CL 100 98 101  CO2 31 29 27   GLUCOSE 169* 149* 147*  BUN 15 18 17   CREATININE 0.89 0.88 0.86  CALCIUM 9.3 9.4 8.9   LFT  Recent Labs  07/08/12 0443  PROT 6.8  ALBUMIN 2.8*  AST 16  ALT 12  ALKPHOS 76  BILITOT 0.5     Assessment / Plan: #1 67 yo WM with multiple medical problems post MRSA, and complicated course with bladder CA and recent  Revision of ileal conduit and bilateral ureteral reimplantation. Pt now with pelvic fluid collection, ileus and cdiff  He is stable and improving. Day # 3 Rx for cdiff  With Iv flagyl and Vanc enemas Continue TNA Follow up Ct today Allow sips clears,popsicles  Etc Hopefully NG can be removed soon.     LOS: 17 days   Amy  Esterwood  07/09/2012, 8:50 AM     ________________________________________________________________________  Corinda Gubler GI MD note:  I personally examined the patient, reviewed the data and agree with the assessment and plan described above.  He ambulated well yesterday, will do so again today.  No flatus but + BS and he feels bowels rumbling a bit now.  NG tube is clamped for the planned CT scan (would like to see if any obstructive component to his bowel issue from the pelvic hematoma).     Rob Bunting, MD [facility] Pager [phone]

## 2012-07-09 NOTE — Clinical Social Work Placement (Signed)
     Clinical Social Work Department CLINICAL SOCIAL WORK PLACEMENT NOTE 07/09/2012  Patient:  Chad Olson, Chad Olson  Account Number:  [id number] Admit date:  06/22/2012  Clinical Social Worker:  Becky Sax, LCSW  Date/time:  07/01/2012 12:00 M  Clinical Social Work is seeking post-discharge placement for this patient at the following level of care:   SKILLED NURSING   (*CSW will update this form in Epic as items are completed)   07/01/2012  Patient/family provided with [facility] Department of Clinical Social Works list of facilities offering this level of care within the geographic area requested by the patient (or if unable, by the patients family).  07/01/2012  Patient/family informed of their freedom to choose among providers that offer the needed level of care, that participate in Medicare, Medicaid or managed care program needed by the patient, have an available bed and are willing to accept the patient.  07/01/2012  Patient/family informed of [facility] ownership interest in [facility], as well as of the fact that they are under no obligation to receive care at this facility.  PASARR submitted to EDS on 07/09/2012 PASARR number received from EDS on 07/09/2012  FL2 transmitted to all facilities in geographic area requested by pt/family on  07/01/2012 FL2 transmitted to all facilities within larger geographic area on   Patient informed that his/her managed care company has contracts with or will negotiate with  certain facilities, including the following:     Patient/family informed of bed offers received:  07/02/2012 Patient chooses bed at [facility] Physician recommends and patient chooses bed at    Patient to be transferred to  on   Patient to be transferred to facility by   The following physician request were entered in Epic:   Additional Comments:

## 2012-07-10 LAB — GLUCOSE, CAPILLARY
Glucose-Capillary: 127 mg/dL — ABNORMAL HIGH (ref 70–99)
Glucose-Capillary: 102 mg/dL — ABNORMAL HIGH (ref 70–99)

## 2012-07-10 LAB — BASIC METABOLIC PANEL
CO2: 27 mEq/L (ref 19–32)
GFR calc non Af Amer: 87 mL/min — ABNORMAL LOW (ref >90)
Glucose, Bld: 127 mg/dL — ABNORMAL HIGH (ref 70–99)
Potassium: 4.1 mEq/L (ref 3.5–5.1)
Sodium: 133 mEq/L — ABNORMAL LOW (ref 135–145)

## 2012-07-10 MED ORDER — TRACE MINERALS CR-CU-F-FE-I-MN-MO-SE-ZN IV SOLN
INTRAVENOUS | Status: AC
  Administered 2012-07-10: 17:00:00 via INTRAVENOUS
  Filled 2012-07-10: qty 2000

## 2012-07-10 MED ORDER — FAT EMULSION 20 % IV EMUL
240.0000 mL | INTRAVENOUS | Status: AC
  Administered 2012-07-10: 240 mL via INTRAVENOUS
  Filled 2012-07-10: qty 250

## 2012-07-10 NOTE — Progress Notes (Signed)
NUTRITION FOLLOW UP  Intervention:   - TPN per pharmacy - Diet advancement per MD - Will continue to monitor   Nutrition Dx:   Inadequate oral intake related to inability to eat as evidenced by NPO - no longer appropriate, diet advanced.   New nutrition dx: Inadequate oral intake related to diet order as evidenced by clear liquid diet.   New goal: TPN to meet >90% of estimated nutritional needs - met  Monitor:   Weights, labs, TPN, diet advancement, BM  Assessment:   Pt discussed during multidisciplinary rounds.   S/p EGD 7/1 that showed fluid/food residue in gastric fundus, gastric body, and gastric antrum c/w gastroparesis  Noted pt with C. Difficile. NGT output improved from total 7/7 to total yesterday, currently clamped. Pt with prolonged ileus.   TPN: Clinimix E 5/15 @ 80 ml/hr and lipids @ 10 ml/hr. Provides  1843 kcal, and 96 grams protein per day. Meets 85% minimum estimated energy needs and 101% minimum re-estimated protein needs.  Noted sodium slightly low, PALB improving. Pt's weight down 7 pounds since admission.   Height: Ht Readings from Last 1 Encounters:  06/23/12 5\' 11"  (1.803 m)    Weight Status:   Wt Readings from Last 1 Encounters:  06/23/12 220 lb 11.2 oz (100.109 kg)    Re-estimated needs:  Kcal: 1950-2100  Protein: 95-120g  Fluid: 1.9-2.1L/day   Skin: Mid abdominal incision, right buttocks abrasion, back left skin tear, left buttocks incision   Diet Order: Clear Liquid   Intake/Output Summary (Last 24 hours) at 07/10/12 0936 Last data filed at 07/10/12 0848  Gross per 24 hour  Intake   1190 ml  Output   3445 ml  Net  -2255 ml    Last BM: 7/7 diarrhea   Labs:   Recent Labs Lab 07/05/12 0555 07/06/12 0450  07/08/12 0443 07/09/12 0425 07/10/12 0630  NA 136 137  < > 135 134* 133*  K 2.9* 3.1*  < > 4.1 4.0 4.1  CL 101 100  < > 98 101 100  CO2 27 29  < > 29 27 27   BUN 13 14  < > 18 17 16   CREATININE 0.93 0.87   < > 0.88 0.86 0.89  CALCIUM 8.6 8.9  < > 9.4 8.9 8.9  MG 2.5 2.5  --  2.2  --   --   PHOS 3.3 3.8  --  4.0  --   --   GLUCOSE 144* 144*  < > 149* 147* 127*  < > = values in this interval not displayed.  CBG (last 3)   Recent Labs  07/09/12 2358 07/10/12 0446 07/10/12 0736  GLUCAP 120* 124* 127*    Scheduled Meds: . famotidine (PEPCID) IV  20 mg Intravenous Q24H  . heparin subcutaneous  5,000 Units Subcutaneous Q8H  . hydrocortisone   Rectal TID  . insulin aspart  0-15 Units Subcutaneous Q4H  . metoCLOPramide (REGLAN) injection  10 mg Intravenous TID AC & HS  . metoprolol  5 mg Intravenous Q6H  . metronidazole  500 mg Intravenous Q8H  . ondansetron  4 mg Intravenous Q4H  . vancomycin (VANCOCIN) rectal ENEMA  500 mg Rectal Q6H    Continuous Infusions: . 0.9 % NaCl with KCl 20 mEq / L 50 mL/hr at 07/09/12 2037  . Marland KitchenTPN (CLINIMIX-E) Adult 80 mL/hr at 07/09/12 1737   And  . fat emulsion 240 mL (07/09/12 1738)  . Marland KitchenTPN (CLINIMIX-E) Adult  And  . fat emulsion       Levon Hedger MS, RD, LDN [phone] Pager [phone] After Hours Pager

## 2012-07-10 NOTE — Progress Notes (Signed)
Patient ID: Chad Olson, male   DOB: 16-Apr-1945, 67 y.o.   MRN: [id number] [facility] Progress Note  Subjective: Has been up walking in hall this am- resting now- sister at bedside- says he is feeling better. NO c/o abdominal pain- pt says just sore,but has not had any pain meds for days. No BM. Able to tolerate NG clamped.  CT abd/pelvis yesterday showed persistent small and large bowel ileus, and large pelvic hematoma which appears about the same size and exerting some mass effect on rectum  Objective:  Vital signs in last 24 hours: Temp:  [98 F (36.7 C)-98.3 F (36.8 C)] 98.2 F (36.8 C) (07/09 0504) Pulse Rate:  [79-86] 85 (07/09 0504) Resp:  [16-18] 16 (07/09 0504) BP: (113-122)/(84-89) 122/89 mmHg (07/09 0504) SpO2:  [97 %-98 %] 98 % (07/09 0504) Last BM Date: 07/08/12 General:   Alert,  Well-developed, WM    in NAD Heart:  Regular rate and rhythm; no murmurs Pulm;clear ant Abdomen:  Soft,mildly tender across lower abdomen and mildly distended.  bowel sounds + without guarding, and without rebound.   Extremities:  Without edema. Neurologic:  Alert and  oriented x4;  grossly normal neurologically. Psych:  Alert and cooperative. Normal mood and affect.  Intake/Output from previous day: 07/08 0701 - 07/09 0700 In: 1190 [P.O.:40; I.V.:800; IV Piggyback:350] Out: 3045 [Urine:2845; Emesis/NG output:200] Intake/Output this shift: Total I/O In: -  Out: 400 [Urine:400]  Lab Results:  Recent Labs  07/08/12 0443  WBC 11.2*  HGB 13.6  HCT 41.9  PLT 374   BMET  Recent Labs  07/08/12 0443 07/09/12 0425 07/10/12 0630  NA 135 134* 133*  K 4.1 4.0 4.1  CL 98 101 100  CO2 29 27 27   GLUCOSE 149* 147* 127*  BUN 18 17 16   CREATININE 0.88 0.86 0.89  CALCIUM 9.4 8.9 8.9   LFT  Recent Labs  07/08/12 0443  PROT 6.8  ALBUMIN 2.8*  AST 16  ALT 12  ALKPHOS 76  BILITOT 0.5      Assessment / Plan: #1 67 yo male with persistent diffuse ileus, and  large pelvic hematoma . PT clinically seems to be slowly improving despite xray looking the same, NG output decreased Continue current regimen Plain film in am #2 Nutrition- continue TPN #3 Cdiff +- no diarrhea- On IV flagyl and Vanc enemas, day # 4      LOS: 18 days   Amy Esterwood  07/10/2012, 9:28 AM   ________________________________________________________________________  Corinda Gubler GI MD note:  I personally examined the patient, reviewed the data and agree with the assessment and plan described above.  I removed the NG tube just now.  Encouraged continued ambulation, will start clear liquids, coffee and observe him clinically.   Rob Bunting, MD [facility] Pager [phone]

## 2012-07-10 NOTE — Progress Notes (Signed)
Physical Therapy Treatment Patient Details Name: WILLMAR STOCKINGER MRN: [id number] DOB: 01/08/1945 Today's Date: 07/10/2012 Time: 4098-1191 PT Time Calculation (min): 38 min  PT Assessment / Plan / Recommendation  PT Comments   Spouse present duing session and very helpful. Assisted pt OOB to amb in hallway however decreased distance today 2nd increased c/o weakness/fatigue.    Follow Up Recommendations  SNF     Does the patient have the potential to tolerate intense rehabilitation     Barriers to Discharge        Equipment Recommendations  None recommended by PT    Recommendations for Other Services    Frequency Min 3X/week   Progress towards PT Goals    Plan      Precautions / Restrictions Precautions Precautions: Fall Precaution Comments: NPO/NG Tube/Supra pubic cath    Pertinent Vitals/Pain C/o ABD cramping/pain    Mobility  Bed Mobility Bed Mobility: Supine to Sit;Sit to Supine Supine to Sit: 4: Min assist;4: Min guard Sit to Supine: 3: Mod assist Details for Bed Mobility Assistance: increased assist back to bed 2nd max c/o weakness/fatigue Transfers Transfers: Sit to Stand;Stand to Sit Sit to Stand: 4: Min guard;From bed Stand to Sit: 4: Min guard;To bed Details for Transfer Assistance: min guard 2nd multiple lines/leads Ambulation/Gait Ambulation/Gait Assistance: 4: Min guard Ambulation Distance (Feet): 65 Feet Ambulation/Gait Assistance Details: decreased amb distance today 2nd increased c/o faitigue/weakness near syncopy in hallway in which NT quickly brought chair to pt. Vitals taken and all WNL.  Increased c/o "exhaustion"  and ABD cramping/pain Gait Pattern: Step-through pattern;Decreased stride length;Wide base of support;Trunk flexed Gait velocity: decreased    Exercises  10 reps B ankle pumps 10 reps B heel slides 10 reps B SAQ's 10 reps B hip ABD 10 reps B hip ADD    PT Goals (current goals can now be found in the care plan section)              progressing     Visit Information  Last PT Received On: 07/10/12 Assistance Needed: +1 History of Present Illness: pt continues with NG tube, foley, IV and NPO    Subjective Data      Cognition       Balance     End of Session PT - End of Session Equipment Utilized During Treatment: Gait belt Activity Tolerance: Patient limited by fatigue Patient left: in bed;with call bell/phone within reach;with family/visitor present   [name]  PTA [facility]  Acute  Rehab Pager      [phone]

## 2012-07-10 NOTE — Progress Notes (Signed)
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 Healed well initially and sent home 6/17. Loopogram 6/27 without internal leak.  2 - Fever / h/o Bacteruria - Pt with MRSA in urine by hospital UCX 03/2012 and again subsequently in blood. Now on Vancomycin daily per ID x 4-6 week total (nearing end of course). Most recent UCX, BCX negative, New set pending from 6/22. Pt readmitted with low-grade fever 6/22 to 100.7 and malaise, no sig leukocytosis. Now on Vanc + Zosyn emirically. UCX with yeast (trated diflucan x 3 days), BCX no growth to date.Pelvic fluid aspirate and no growth / final.  ID now following,and DC'd all ABX given eval except for current C. Diff treatment.  3 - Heme / Recent DVT + PE -incidental PE by CT 03/2012 now on Lovenox, DVT resolved by most recent LE duplex. Repeat CT-PE 6/23 without PE or active bleed from pelvis and Lovenox DC'd. Hgb drift to 7 6/24 and transfused 2 u with response to 8, transfused 2u additional 6/25 with increase to 10. Hematology consult [name]) also agrees no treatment dose anticoagulation at this point, but resume proph dosing since he is not ambulating much.  4 - Pelvic Fluid Collection - Pelvic fluid collection without rim-enhancement noted on CT from ER 6/22. Appears partially simple fluid with some dependent blood by HU analysis, no active extrav of blood or urine by imaging. IR aspiration performed 6/26 and fluid c/w hematoma only (not purulent, not simple/drainable).   5 - Hemorrhoids / Lower GI Bleed - Long h/o hemorroids. Pt with worsened / friable bleeding hemorroids on admission that  are quite bothersome. No prior specific intervention, likely exacerbated by pelvic fluid collection / hematoma. Gen Surg recs conservative measures.   6 - Nutrition / Nausea / C. Difficile- Pt with some element of baseline GI issues with GERD and prior small esophageal polyp. Has had exacerbation of nausea in house and not meeting nutritional goals. GI now involved and EGD + KUB with ileus / gastroparesis picture. No high-grade mechanical obstruction given continued stool + gas. While ileus resolving placed on TPN and NGT for symptom relief and to bridge him with nutrition. C. Diff found positive 7/5 and started on dual therapy with flagyl + vanc enemas with plan for 14 day course.   7 - Disposition - PT eval has recommended DC to SNF when appropriate and family amenable.  PMH sig for obesity and left knee replacement, PE + DVT (now resolved). No CV disease.   Today Chad Olson is more active. Ambulating in halls, tolerating NG clamped during walks.   Objective: Vital signs in last 24 hours: Temp:  [97.8 F (36.6 C)-98.2 F (36.8 C)] 97.8 F (36.6 C) (07/09 1344) Pulse Rate:  [76-85] 76 (07/09 1344) Resp:  [16-18] 18 (07/09 1344) BP: (122-131)/(84-89) 131/85 mmHg (07/09 1344) SpO2:  [97 %-100 %] 100 % (07/09 1344) Last BM Date: 07/08/12  Intake/Output from previous day: 07/08 0701 - 07/09 0700 In: 1190 [P.O.:40; I.V.:800; IV Piggyback:350] Out: 3045 [Urine:2845; Emesis/NG output:200] Intake/Output this shift: Total I/O In: 1360 [P.O.:360; I.V.:800; IV Piggyback:200] Out: 1100 [Urine:1100]  General appearance: alert, cooperative and appears stated age Head: Normocephalic, without obvious abnormality, atraumatic Eyes: conjunctivae/corneas clear. PERRL, EOM's intact. Fundi benign. Ears: normal  TM's and external ear canals both ears Nose: Nares normal. Septum midline. Mucosa normal. No drainage or sinus tenderness., NGT in place with minimal bilious output Throat: lips, mucosa, and tongue  normal; teeth and gums normal Neck: no adenopathy, no carotid bruit, no JVD, supple, symmetrical, trachea midline and thyroid not enlarged, symmetric, no tenderness/mass/nodules Back: symmetric, no curvature. ROM normal. No CVA tenderness. Resp: normal percussion bilaterally Chest wall: no tenderness Cardio: regular rate and rhythm, S1, S2 normal, no murmur, click, rub or gallop GI: soft, non-tender; bowel sounds normal; no masses,  no organomegaly Male genitalia: normal, nearly resolved perineal ecchymoses Extremities: extremities normal, atraumatic, no cyanosis or edema and RUE PICC c/d/i. Pulses: 2+ and symmetric Skin: Skin color, texture, turgor normal. No rashes or lesions Lymph nodes: Cervical, supraclavicular, and axillary nodes normal. Neurologic: Grossly normal Incision/Wound: Lower midline incision area c/d/i with staples. Urostomy pink / patent with Bander stent in place. Urine clear. Decreased abd tympany.  Lab Results:   Recent Labs  07/08/12 0443  WBC 11.2*  HGB 13.6  HCT 41.9  PLT 374   BMET  Recent Labs  07/09/12 0425 07/10/12 0630  NA 134* 133*  K 4.0 4.1  CL 101 100  CO2 27 27  GLUCOSE 147* 127*  BUN 17 16  CREATININE 0.86 0.89  CALCIUM 8.9 8.9   PT/INR No results found for this basename: LABPROT, INR,  in the last 72 hours ABG No results found for this basename: PHART, PCO2, PO2, HCO3,  in the last 72 hours  Studies/Results: Ct Abdomen Pelvis W Contrast  07/09/2012   *RADIOLOGY REPORT*  Clinical Data: Diffuse abdominal pain.  Ileus.  Pelvic hematoma. Nausea.  Fever.  Abdominal distention.  CT ABDOMEN AND PELVIS WITH CONTRAST  Technique:  Multidetector CT imaging of the abdomen and pelvis was performed following the standard protocol during bolus administration of intravenous contrast.  Contrast: OMNIPAQUE IOHEXOL 300 MG/ML  SOLN  Comparison: 06/27/2012  Findings: Postop changes again seen from prior cystectomy and right lower quadrant ileostomy.  Bilateral internal ureteral stents remain in place and there is no evidence of hydronephrosis.  A left percutaneous nephrostomy tube also remains in place.  Small bilateral renal cysts are stable and no renal masses are identified.  A central pelvic hematoma shows decreased attenuation since prior study, consistent with expected evolution.  This hematoma measures 8.2 x 13.1 cm and is not significant changed in size.  Diffuse distention of small bowel and colon is seen, consistent with a diffuse ileus which is increased since previous study.  No transition point noted.  No evidence of bowel wall thickening.  The liver, pancreas, spleen, and adrenal glands are normal appearance.  Tiny calcified gallstones are noted, however there is no evidence of cholecystitis or biliary dilatation.  IMPRESSION:  1.  13 cm pelvic hematoma shows decreased attenuation but no significant change in size since previous exam. 2.  Increased diffuse ileus pattern. 3. Cholelithiasis.  No radiographic evidence of cholecystitis.   Original Report Authenticated By: Myles Rosenthal, M.D.    Anti-infectives: Anti-infectives   Start     Dose/Rate Route Frequency Ordered Stop   07/06/12 1800  vancomycin (VANCOCIN) 500 mg in sodium chloride irrigation 0.9 % 100 mL ENEMA     500 mg Rectal 4 times per day 07/06/12 1335 07/20/12 1759   07/06/12 1430  metroNIDAZOLE (FLAGYL) IVPB 500 mg     500 mg 100 mL/hr over 60 Minutes Intravenous 3 times per day 07/06/12 1335  06/25/12 1000  vancomycin (VANCOCIN) 1,250 mg in sodium chloride 0.9 % 250 mL IVPB  Status:  Discontinued     1,250 mg 166.7 mL/hr over 90 Minutes Intravenous Every 12 hours 06/25/12 0856 06/28/12 1102   06/24/12 2000  fluconazole (DIFLUCAN) IVPB 200 mg     200 mg 100 mL/hr over 60 Minutes Intravenous Every 24 hours 06/24/12 1733 06/26/12 2322   06/23/12 0900  vancomycin (VANCOCIN) 1,250 mg in sodium chloride 0.9 % 250 mL IVPB     1,250 mg 166.7 mL/hr over 90 Minutes  Intravenous Every 12 hours 06/23/12 0522 06/24/12 2231   06/23/12 0600  piperacillin-tazobactam (ZOSYN) IVPB 3.375 g  Status:  Discontinued     3.375 g 12.5 mL/hr over 240 Minutes Intravenous 3 times per day 06/23/12 0447 06/28/12 1102   06/23/12 0330  cefTRIAXone (ROCEPHIN) 1 g in dextrose 5 % 50 mL IVPB     1 g 100 mL/hr over 30 Minutes Intravenous  Once 06/23/12 0319 06/23/12 0440      Assessment/Plan:  1 - Bladder Cancer / Cystectomy / Ureteral Revision- NED form cancer perspective.Loopogram without leak this admission.  2 - Fever / h/o Bacteruria - Now off wound-specific ABX given negative CX from blood, urine, pelvic fluid.  3 - Heme / Recent DVT + PE -Lovenox now stopped. SCD's in place.  . PT following. Remain proph SQ heparin + SCD's. He is now making much more progress in terms of ambulation.  4 - Pelvic Fluid Collection - Imaging and aspirate c/w non-infected hemaotma. Size stable / decreasing as expected. Eplained to pt this will resolve, but slowly (weeks-mos).  5 - Hemorrhoids / Lower GI Bleed - Agree with conservative measures for now. Hgb stable.  6 - Nutrition / Nausea / C. Difficile- Remains central active issue. Agree with aggressive treatment for C. Diff with continued NGT while high-output, though trending somewhat better. No fevers / leukocytosis. NGT with only 200 cc past 24 hours very encouraging ileus resolving.  7 - Disposition - SNF pending nutritional status and ileus resolution.  Greatly appreciate hospitalist, gen surgery, ID, hematology,PT, GI, TPN pharmacy input.  [facility], Chad Olson 07/10/2012

## 2012-07-10 NOTE — Progress Notes (Signed)
[facility] PROGRESS NOTE  Chad Olson EXB:[id number] DOB: [birth year] DOA: 06/22/2012 PCP: [name], MD    Assessment/Plan: Nausea/Vomiting: Possible gastroparesis. S/P endoscopy 7-1 show: The mucosa of the esophagus appeared normal; likely esophageal Spasm. Fluid/food residue in the gastric fundus, gastric body, and gastric antrum consistent with gastroparesis (felt, at least in part, to explain nausea). Now with positive C. Diff results. -GI recommend Reglan QID, limit narcotics and continue  ambulation.  -continue antiemetics and Pepcid -continue tx for C. diff  Gastroparesis/iIleus/poor nutrition: No evidence of obstruction on CT scan bd x-ray on 7/2. -continue NPO status -NGT discontinue 7-9. Started on clear diet.  -continue reglan -will continue TNA for nutrition bridging -Continue electrolytes repletion (Goal is K as close to 4 as possible)  C. Diff colitis -due to ileus and inability to tolerate PO, qualify in complicated case of C. Diff by protocol. -will continue tx with vanc enemas and IV flagyl -transition to PO regimen once able to tolerate PO and NGT is discontinue.  Hx of DVT and bilateral PE :  -repeated CT scan hematoma stable. HB stable at 11. Patient was started on prophylaxis dose heparin.  -Recent Doppler of his lower extremity was negative for DVT.  -Repeated a CT angiogram of the chest which was negative for PE.  -Patient received 3 Month treatment for PE.  -Appreciate Dr Cyndie Chime recommendation. No indication for IVC filter.  -will continue heparin for DVT prophylaxis  Pelvic hematoma: Patient relates worsening abdominal pain. Hematoma stable by CT scan. No orther pathology by CT scan. Monitor hb on heparin. Abdominal pain multifactorial, including hematoma. -repeated CT abd to check for stability on 7/8 demonstrated no increase in size and no significant compromise with intestine.\ -Repeat CT 7-8 stable pelvic hematoma.   Fever with mild  leucocytosis:  Pelvic fluid collection Aspiration negative for bacteria.  Completed treatment with IV vancomycin for MRSA bacteremia. (Vancomycin stopped 6-27).  Urine cx growing yeast, fluconazole stopped after 3 days tx.  -now receiving treatment for c. Diff. WBC 11.5 and no fever.  Anemia  Received a total of 4 units PRBC during this admission. Hb stable. Last Hgb 7/7 13.6  hemorrhoids  -General surgery consulted and recommended sitz bath and ice packs for now.  -continue anusol rectal creams.   Hypokalemia  -Will replete as needed and will try to maintain above 4 -some potassium also given with TNA -due GI losses  -will continue with aggressive repletion. -K 4.1 today, will monitor closely  Hypertension  Blood pressure stable. Will continue metoprolol IV  Hyperlipidemia  Continue holding statin for now and will resume when tolerating PO   Bladder cancer: S/P surgery.  Treatment per primary team.  Loopogram negative for leak.    Code Status: Full Family Communication: wife and sister at bedside Disposition Plan: SNF vs Home with [facility] services at discharge  Antibiotics:  Vancomycin enemas 07/06/12  IV flagyl 07/06/12  HPI/Subjective: Patient denies worsening abdominal pain. Was ambulating earlier today in the hall. No Diarrhea.   Objective: Filed Vitals:   07/09/12 0529 07/09/12 1300 07/09/12 2105 07/10/12 0504  BP: 130/85 113/84 122/84 122/89  Pulse: 87 86 79 85  Temp: 98 F (36.7 C) 98.3 F (36.8 C) 98 F (36.7 C) 98.2 F (36.8 C)  TempSrc: Oral Oral Oral Oral  Resp: 16 18 18 16   Height:      Weight:      SpO2: 96% 98% 97% 98%    Intake/Output Summary (Last 24 hours) at  07/10/12 1241 Last data filed at 07/10/12 0848  Gross per 24 hour  Intake   1190 ml  Output   2925 ml  Net  -1735 ml   Filed Weights   06/23/12 0405 06/23/12 0449  Weight: 103.2 kg (227 lb 8.2 oz) 100.109 kg (220 lb 11.2 oz)    Exam:   General:  Afebrile, no distress.    Cardiovascular: S1 and S2, no rubs or gallops  Respiratory: CTA bilaterally  Abdomen: softer, less distended, urostomy bag in place, mid surgical wound w/o signs or infection; less tender to palpation; decreased but positive BS  Musculoskeletal: trace edema bilaterally  Data Reviewed: Basic Metabolic Panel:  Recent Labs Lab 07/04/12 0400 07/05/12 0555 07/06/12 0450 07/07/12 0410 07/08/12 0443 07/09/12 0425 07/10/12 0630  NA 137 136 137 138 135 134* 133*  K 2.8* 2.9* 3.1* 4.1 4.1 4.0 4.1  CL 101 101 100 100 98 101 100  CO2 28 27 29 31 29 27 27   GLUCOSE 134* 144* 144* 169* 149* 147* 127*  BUN 13 13 14 15 18 17 16   CREATININE 0.96 0.93 0.87 0.89 0.88 0.86 0.89  CALCIUM 8.6 8.6 8.9 9.3 9.4 8.9 8.9  MG 2.4 2.5 2.5  --  2.2  --   --   PHOS 3.1 3.3 3.8  --  4.0  --   --    CBC:  Recent Labs Lab 07/08/12 0443  WBC 11.2*  NEUTROABS 8.4*  HGB 13.6  HCT 41.9  MCV 93.3  PLT 374   CBG:  Recent Labs Lab 07/09/12 1959 07/09/12 2358 07/10/12 0446 07/10/12 0736 07/10/12 1143  GLUCAP 112* 120* 124* 127* 123*    Recent Results (from the past 240 hour(s))  CLOSTRIDIUM DIFFICILE BY PCR     Status: Abnormal   Collection Time    07/06/12 10:05 AM      Result Value Range Status   C difficile by pcr POSITIVE (*) NEGATIVE Final   Comment: CRITICAL RESULT CALLED TO, READ BACK BY AND VERIFIED WITH:     E.HARLESS,RN 1324 07/06/12 EHOWARD     Studies: Ct Abdomen Pelvis W Contrast  07/09/2012   *RADIOLOGY REPORT*  Clinical Data: Diffuse abdominal pain.  Ileus.  Pelvic hematoma. Nausea.  Fever.  Abdominal distention.  CT ABDOMEN AND PELVIS WITH CONTRAST  Technique:  Multidetector CT imaging of the abdomen and pelvis was performed following the standard protocol during bolus administration of intravenous contrast.  Contrast: OMNIPAQUE IOHEXOL 300 MG/ML  SOLN  Comparison: 06/27/2012  Findings: Postop changes again seen from prior cystectomy and right lower quadrant ileostomy.  Bilateral internal ureteral stents remain in place and there is no evidence of hydronephrosis.  A left percutaneous nephrostomy tube also remains in place.  Small bilateral renal cysts are stable and no renal masses are identified.  A central pelvic hematoma shows decreased attenuation since prior study, consistent with expected evolution.  This hematoma measures 8.2 x 13.1 cm and is not significant changed in size.  Diffuse distention of small bowel and colon is seen, consistent with a diffuse ileus which is increased since previous study.  No transition point noted.  No evidence of bowel wall thickening.  The liver, pancreas, spleen, and adrenal glands are normal appearance.  Tiny calcified gallstones are noted, however there is no evidence of cholecystitis or biliary dilatation.  IMPRESSION:  1.  13 cm pelvic hematoma shows decreased attenuation but no significant change in size since previous exam. 2.  Increased diffuse ileus  pattern. 3. Cholelithiasis.  No radiographic evidence of cholecystitis.   Original Report Authenticated By: Myles Rosenthal, M.D.    Scheduled Meds: . famotidine (PEPCID) IV  20 mg Intravenous Q24H  . heparin subcutaneous  5,000 Units Subcutaneous Q8H  . hydrocortisone   Rectal TID  . insulin aspart  0-15 Units Subcutaneous Q4H  . metoCLOPramide (REGLAN) injection  10 mg Intravenous TID AC & HS  . metoprolol  5 mg Intravenous Q6H  . metronidazole  500 mg Intravenous Q8H  . ondansetron  4 mg Intravenous Q4H  . vancomycin (VANCOCIN) rectal ENEMA  500 mg Rectal Q6H   Continuous Infusions: . 0.9 % NaCl with KCl 20 mEq / L 50 mL/hr at 07/09/12 2037  . Marland KitchenTPN (CLINIMIX-E) Adult 80 mL/hr at 07/09/12 1737   And  . fat emulsion 240 mL (07/09/12 1738)  . Marland KitchenTPN (CLINIMIX-E) Adult     And  . fat emulsion      Principal Problem:   Pelvic hematoma, male Active Problems:   Bladder cancer   Acute pulmonary embolism (3/14)   DVT (deep venous thrombosis) 3/14   Fever   Bacteremia MRSA  5/14   VTE (venous thromboembolism)   Protein-calorie malnutrition, severe   Acute blood loss anemia   Gastroparesis    Time spent: < 25 minutes   Beryle Bagsby  [facility] Pager [phone]. If 7PM-7AM, please contact night-coverage at [email or web address], password [facility] 07/10/2012, 12:41 PM  LOS: 18 days

## 2012-07-10 NOTE — Progress Notes (Signed)
Chad Olson has revoked bed offer based on new information. CSW following for snf versus possible home due to patient improving in status.  Chad Olson MSW, LCSW [phone]

## 2012-07-10 NOTE — Progress Notes (Signed)
PARENTERAL NUTRITION CONSULT NOTE - Follow-Up  Pharmacy Consult for TPN Indication: Gastroparesis/ileus  No Known Allergies  Patient Measurements: Height: 5\' 11"  (180.3 cm) Weight: 220 lb 11.2 oz (100.109 kg) IBW/kg (Calculated) : 75.3  Vital Signs: Temp: 98.2 F (36.8 C) (07/09 0504) Temp src: Oral (07/09 0504) BP: 122/89 mmHg (07/09 0504) Pulse Rate: 85 (07/09 0504) Intake/Output from previous day: 07/08 0701 - 07/09 0700 In: 1190 [P.O.:40; I.V.:800; IV Piggyback:350] Out: 3045 [Urine:2845; Emesis/NG output:200]  Labs:  Recent Labs  07/08/12 0443  WBC 11.2*  HGB 13.6  HCT 41.9  PLT 374     Recent Labs  07/08/12 0443 07/09/12 0425 07/10/12 0630  NA 135 134* 133*  K 4.1 4.0 4.1  CL 98 101 100  CO2 29 27 27   GLUCOSE 149* 147* 127*  BUN 18 17 16   CREATININE 0.88 0.86 0.89  CALCIUM 9.4 8.9 8.9  MG 2.2  --   --   PHOS 4.0  --   --   PROT 6.8  --   --   ALBUMIN 2.8*  --   --   AST 16  --   --   ALT 12  --   --   ALKPHOS 76  --   --   BILITOT 0.5  --   --   PREALBUMIN 16.6*  --   --   TRIG 110  --   --    Estimated Creatinine Clearance: 98.4 ml/min (by C-G formula based on Cr of 0.89).    Recent Labs  07/09/12 2358 07/10/12 0446 07/10/12 0736  GLUCAP 120* 124* 127*    Insulin Requirements in the past 24 hours:  8 units Novolog sensitive SSI  Nutritional Goals:  RD recs: 1950-2100 Kcals, 80-95gm protein Clinimix 5/15 at a goal rate of  43ml/hr + 20% fat emulsion at 92ml/hr to provide: 96g/day protein, 1843Kcal/day.  Current Nutrition: NPO  IVF: NS with KCl 20 mEq/L at 50 ml/hr  Assessment: 67 YO M admitted 6/22 with abdominal pain s/p radical cystoprostatectomy for bladder cancer Feb 2014. On 06/15/12 he required ureteral reimplantation into lleal conduit. He has been unable to tolerate oral diet with persistent N/V, EGD done 7/1 revealed likely gastroparesis, abd films 7/2 consistent with ileus. NGT placed 7/2 with immediate brown-green  drainage. Due to poor nutritional intake and ileus, orders to start TPN 7/2 pm. 7/6 abdominal study shows minimal changes. 7/8 abdominal CT shows: "hematoma shows decreased attenuation but no significant change in size since previous exam. Increased diffuse ileus pattern."  PMH: HTN, lipidemia, GERD, DM (diet controlled), bladder cancer   TPN day# 8   Glucose - CBGs below goal <150 mg/dL. Has h/o diet-controlled DM  Electrolytes - K= 4.1. Per MD, goal K+ greater than 4.  Na slightly low, continues to slowly trend down (unable to change Na-content of premixed TNA)   Renal: SCr WNL.   NG output continues to decrease. Patient now (+) C.diff.  LFTs - WNL (7/7)  TGs - 161 (7/3), 110 (7/7)  Prealbumin - 13.4 (7/3)  TPN Access: PICC   Plan: At 1800 today:  Continue Clinimix E 5/15 at goal rate of 28ml/hr to meet protein goal and 95% of kcal goal.    20% fat emulsion at 50ml/hr.  TNA to contain standard multivitamins and trace elements.  Continue IVF per MD.  Continue current SSI  TNA lab panels on Mondays & Thursdays.  F/u daily.  Darrol Angel, PharmD Pager: [phone] 07/10/2012 8:22 AM

## 2012-07-11 ENCOUNTER — Inpatient Hospital Stay (HOSPITAL_COMMUNITY): Payer: BC Managed Care – PPO

## 2012-07-11 LAB — CBC
MCV: 94.5 fL (ref 78.0–100.0)
Platelets: 356 10*3/uL (ref 150–400)
RBC: 4.39 MIL/uL (ref 4.22–5.81)
RDW: 15.6 % — ABNORMAL HIGH (ref 11.5–15.5)
WBC: 8.5 10*3/uL (ref 4.0–10.5)

## 2012-07-11 LAB — GLUCOSE, CAPILLARY
Glucose-Capillary: 110 mg/dL — ABNORMAL HIGH (ref 70–99)
Glucose-Capillary: 136 mg/dL — ABNORMAL HIGH (ref 70–99)

## 2012-07-11 LAB — COMPREHENSIVE METABOLIC PANEL
ALT: 15 U/L (ref 0–53)
BUN: 15 mg/dL (ref 6–23)
CO2: 27 mEq/L (ref 19–32)
Calcium: 8.8 mg/dL (ref 8.4–10.5)
Creatinine, Ser: 0.83 mg/dL (ref 0.50–1.35)
GFR calc Af Amer: >90 mL/min (ref >90)
GFR calc non Af Amer: 90 mL/min — ABNORMAL LOW (ref >90)
Glucose, Bld: 135 mg/dL — ABNORMAL HIGH (ref 70–99)
Sodium: 139 mEq/L (ref 135–145)
Total Protein: 6.2 g/dL (ref 6.0–8.3)

## 2012-07-11 LAB — PHOSPHORUS: Phosphorus: 3.9 mg/dL (ref 2.3–4.6)

## 2012-07-11 LAB — MAGNESIUM: Magnesium: 2.2 mg/dL (ref 1.5–2.5)

## 2012-07-11 MED ORDER — POTASSIUM CHLORIDE CRYS ER 20 MEQ PO TBCR
40.0000 meq | EXTENDED_RELEASE_TABLET | Freq: Once | ORAL | Status: AC
  Administered 2012-07-11: 40 meq via ORAL
  Filled 2012-07-11: qty 2

## 2012-07-11 MED ORDER — TRACE MINERALS CR-CU-F-FE-I-MN-MO-SE-ZN IV SOLN
INTRAVENOUS | Status: AC
  Administered 2012-07-11: 18:00:00 via INTRAVENOUS
  Filled 2012-07-11: qty 2000

## 2012-07-11 MED ORDER — FAT EMULSION 20 % IV EMUL
240.0000 mL | INTRAVENOUS | Status: AC
  Administered 2012-07-11: 240 mL via INTRAVENOUS
  Filled 2012-07-11: qty 250

## 2012-07-11 MED ORDER — VANCOMYCIN 50 MG/ML ORAL SOLUTION
250.0000 mg | Freq: Four times a day (QID) | ORAL | Status: DC
  Administered 2012-07-11 – 2012-07-24 (×52): 250 mg via ORAL
  Filled 2012-07-11 (×57): qty 5

## 2012-07-11 NOTE — Progress Notes (Signed)
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 Healed well initially and sent home 6/17. Loopogram 6/27 without internal leak.  2 - Fever / h/o Bacteruria - Pt with MRSA in urine by hospital UCX 03/2012 and again subsequently in blood. Now on Vancomycin daily per ID x 4-6 week total (nearing end of course). Most recent UCX, BCX negative, New set pending from 6/22. Pt readmitted with low-grade fever 6/22 to 100.7 and malaise, no sig leukocytosis. Now on Vanc + Zosyn emirically. UCX with yeast (trated diflucan x 3 days), BCX no growth to date.Pelvic fluid aspirate and no growth / final.  ID now following,and DC'd all ABX given eval except for current C. Diff treatment.  3 - Heme / Recent DVT + PE -incidental PE by CT 03/2012 now on Lovenox, DVT resolved by most recent LE duplex. Repeat CT-PE 6/23 without PE or active bleed from pelvis and Lovenox DC'd. Hgb drift to 7 6/24 and transfused 2 u with response to 8, transfused 2u additional 6/25 with increase to 10. Hematology consult [name]) also agrees no treatment dose anticoagulation at this point, but resume proph dosing since he is not ambulating much.  4 - Pelvic Fluid Collection - Pelvic fluid collection without rim-enhancement noted on CT from ER 6/22. Appears partially simple fluid with some dependent blood by HU analysis, no active extrav of blood or urine by imaging. IR aspiration performed 6/26 and fluid c/w hematoma only (not purulent, not simple/drainable).   5 - Hemorrhoids / Lower GI Bleed - Long h/o hemorroids. Pt with worsened / friable bleeding hemorroids on admission that  are quite bothersome. No prior specific intervention, likely exacerbated by pelvic fluid collection / hematoma. Gen Surg recs conservative measures and now nearly resolved clinically.  6 - Nutrition / Nausea / C. Difficile- Pt with some element of baseline GI issues with GERD and prior small esophageal polyp. Has had exacerbation of nausea in house and not meeting nutritional goals. GI now involved and EGD + KUB with ileus / gastroparesis picture. No high-grade mechanical obstruction given continued stool + gas. While ileus resolving placed on TPN and NGT for symptom relief and to bridge him with nutrition. C. Diff found positive 7/5 and started on dual therapy with flagyl + vanc enemas with plan for 14 day course. NGT DC'd 7/9 as output minimal.  7 - Disposition - PT eval has recommended DC to SNF when appropriate and family amenable.  PMH sig for obesity and left knee replacement, PE + DVT (now resolved). No CV disease.   Today Chad Olson remains more active. KUB this AM still with some air-fluid levels, but tolerating NGT out as well as some clears w/o nausea / emesis.  Objective: Vital signs in last 24 hours: Temp:  [97.7 F (36.5 C)-97.8 F (36.6 C)] 97.7 F (36.5 C) (07/10 0507) Pulse Rate:  [75-82] 82 (07/10 0507) Resp:  [16-18] 16 (07/09 2003) BP: (115-131)/(72-85) 127/78 mmHg (07/10 0507) SpO2:  [99 %-100 %] 99 % (07/10 0507) Last BM Date: 07/08/12  Intake/Output from previous day: 07/09 0701 - 07/10 0700 In: 2700 [P.O.:700; I.V.:1600; IV Piggyback:400] Out: 4100 [Urine:4100] Intake/Output this shift:    General appearance: alert, cooperative and appears stated age Head: Normocephalic, without obvious abnormality, atraumatic  Eyes: conjunctivae/corneas clear. PERRL, EOM's intact. Fundi benign. Ears: normal TM's and external ear canals both ears Nose: Nares normal. Septum midline. Mucosa normal. No drainage or sinus tenderness. Throat: lips, mucosa, and tongue normal; teeth and  gums normal Neck: no adenopathy, no carotid bruit, no JVD, supple, symmetrical, trachea midline and thyroid not enlarged, symmetric, no tenderness/mass/nodules Back: symmetric, no curvature. ROM normal. No CVA tenderness., left neph tube capped, dry. Resp: clear to auscultation bilaterally Cardio: regular rate and rhythm, S1, S2 normal, no murmur, click, rub or gallop GI: soft, non-tender; bowel sounds normal; no masses,  no organomegaly and Decreased gas pockets by percussion. Male genitalia: normal, perineal ecchymoses resolved. Extremities: extremities normal, atraumatic, no cyanosis or edema and RUE PICC c/d/i. Pulses: 2+ and symmetric Skin: Skin color, texture, turgor normal. No rashes or lesions Lymph nodes: Cervical, supraclavicular, and axillary nodes normal. Neurologic: Grossly normal Incision/Wound: Lower midline incision c/d/i. RLQ Urostomy pink / patent with Bander stents in place.  Lab Results:   Recent Labs  07/11/12 0410  WBC 8.5  HGB 13.3  HCT 41.5  PLT 356   BMET  Recent Labs  07/10/12 0630 07/11/12 0410  NA 133* 139  K 4.1 3.9  CL 100 104  CO2 27 27  GLUCOSE 127* 135*  BUN 16 15  CREATININE 0.89 0.83  CALCIUM 8.9 8.8   PT/INR No results found for this basename: LABPROT, INR,  in the last 72 hours ABG No results found for this basename: PHART, PCO2, PO2, HCO3,  in the last 72 hours  Studies/Results: Ct Abdomen Pelvis W Contrast  07/09/2012   *RADIOLOGY REPORT*  Clinical Data: Diffuse abdominal pain.  Ileus.  Pelvic hematoma. Nausea.  Fever.  Abdominal distention.  CT ABDOMEN AND PELVIS WITH CONTRAST  Technique:  Multidetector CT imaging of the abdomen and pelvis was performed following the standard protocol during bolus administration of intravenous contrast.  Contrast: OMNIPAQUE IOHEXOL 300 MG/ML  SOLN  Comparison: 06/27/2012  Findings: Postop changes again seen from prior cystectomy and right lower quadrant ileostomy. Bilateral internal ureteral  stents remain in place and there is no evidence of hydronephrosis.  A left percutaneous nephrostomy tube also remains in place.  Small bilateral renal cysts are stable and no renal masses are identified.  A central pelvic hematoma shows decreased attenuation since prior study, consistent with expected evolution.  This hematoma measures 8.2 x 13.1 cm and is not significant changed in size.  Diffuse distention of small bowel and colon is seen, consistent with a diffuse ileus which is increased since previous study.  No transition point noted.  No evidence of bowel wall thickening.  The liver, pancreas, spleen, and adrenal glands are normal appearance.  Tiny calcified gallstones are noted, however there is no evidence of cholecystitis or biliary dilatation.  IMPRESSION:  1.  13 cm pelvic hematoma shows decreased attenuation but no significant change in size since previous exam. 2.  Increased diffuse ileus pattern. 3. Cholelithiasis.  No radiographic evidence of cholecystitis.   Original Report Authenticated By: Myles Rosenthal, M.D.    Anti-infectives: Anti-infectives   Start     Dose/Rate Route Frequency Ordered Stop   07/06/12 1800  vancomycin (VANCOCIN) 500 mg in sodium chloride irrigation 0.9 % 100 mL ENEMA     500 mg Rectal 4 times per day 07/06/12 1335 07/20/12 1759   07/06/12 1430  metroNIDAZOLE (FLAGYL) IVPB 500 mg     500 mg 100 mL/hr over 60 Minutes Intravenous 3 times per day  07/06/12 1335     06/25/12 1000  vancomycin (VANCOCIN) 1,250 mg in sodium chloride 0.9 % 250 mL IVPB  Status:  Discontinued     1,250 mg 166.7 mL/hr over 90 Minutes Intravenous Every 12 hours 06/25/12 0856 06/28/12 1102   06/24/12 2000  fluconazole (DIFLUCAN) IVPB 200 mg     200 mg 100 mL/hr over 60 Minutes Intravenous Every 24 hours 06/24/12 1733 06/26/12 2322   06/23/12 0900  vancomycin (VANCOCIN) 1,250 mg in sodium chloride 0.9 % 250 mL IVPB     1,250 mg 166.7 mL/hr over 90 Minutes Intravenous Every 12 hours 06/23/12  0522 06/24/12 2231   06/23/12 0600  piperacillin-tazobactam (ZOSYN) IVPB 3.375 g  Status:  Discontinued     3.375 g 12.5 mL/hr over 240 Minutes Intravenous 3 times per day 06/23/12 0447 06/28/12 1102   06/23/12 0330  cefTRIAXone (ROCEPHIN) 1 g in dextrose 5 % 50 mL IVPB     1 g 100 mL/hr over 30 Minutes Intravenous  Once 06/23/12 0319 06/23/12 0440      Assessment/Plan:  1 - Bladder Cancer / Cystectomy / Ureteral Revision- NED form cancer perspective.Loopogram without leak this admission. WIll consider staple removal and left neph tube removal under fluoro later this admission when back to baseline in terms of GI status.  2 - Fever / h/o Bacteruria - Now off wound-specific ABX given negative CX from blood, urine, pelvic fluid.  3 - Heme / Recent DVT + PE -Lovenox now stopped. SCD's in place.  . PT following. Remain proph SQ heparin + SCD's. He is now making much more progress in terms of ambulation and PT.  4 - Pelvic Fluid Collection - Imaging and aspirate c/w non-infected hemaotma. Size stable / decreasing as expected. Eplained to pt this will resolve, but slowly (weeks-mos).  5 - Hemorrhoids / Lower GI Bleed - Agree with conservative measures for now. Hgb stable.  6 - Nutrition / Nausea / C. Difficile- Remains central active issue. Agree with aggressive treatment for C. Diff. Encouraged by tollerating NGT clamping and clears. KUB with some radiographic persistance of ileus picture, but improved and clinically improved.   7 - Disposition - SNF pending nutritional status and ileus resolution.  Greatly appreciate hospitalist, gen surgery, ID, hematology,PT, GI, TPN pharmacy input.  [facility], Sanaia Jasso 07/11/2012

## 2012-07-11 NOTE — Progress Notes (Signed)
[facility] PROGRESS NOTE  Chad Olson ZOX:[id number] DOB: 08-18-1945 DOA: 06/22/2012 PCP: [name], MD    Assessment/Plan: Nausea/Vomiting: Possible gastroparesis. S/P endoscopy 7-1 show: The mucosa of the esophagus appeared normal; likely esophageal Spasm. Fluid/food residue in the gastric fundus, gastric body, and gastric antrum consistent with gastroparesis (felt, at least in part, to explain nausea). Now with positive C. Diff results. -GI recommend Reglan QID, limit narcotics and continue  ambulation.  -continue antiemetics and Pepcid -continue tx for C. diff  Gastroparesis/iIleus/poor nutrition: No evidence of obstruction on CT scan bd x-ray on 7/2. -continue NPO status -NGT discontinue 7-9. Started on clear diet.  -continue reglan -will continue TNA for nutrition bridging -Continue electrolytes repletion (Goal is K as close to 4 as possible)  C. Diff colitis -due to ileus and inability to tolerate PO, qualify in complicated case of C. Diff by protocol. -will continue tx with  IV flagyl, vancomycin change to oral.    Hx of DVT and bilateral PE :  -repeated CT scan hematoma stable. HB stable at 11. Patient was started on prophylaxis dose heparin.  -Recent Doppler of his lower extremity was negative for DVT.  -Repeated a CT angiogram of the chest which was negative for PE.  -Patient received 3 Month treatment for PE.  -Appreciate Dr Cyndie Chime recommendation. No indication for IVC filter.  -will continue heparin for DVT prophylaxis  Pelvic hematoma: Patient relates worsening abdominal pain. Hematoma stable by CT scan. No orther pathology by CT scan. Monitor hb on heparin. Abdominal pain multifactorial, including hematoma. -repeated CT abd to check for stability on 7/8 demonstrated no increase in size and no significant compromise with intestine. -Repeat CT 7-8 stable pelvic hematoma.   Fever with mild leucocytosis:  Pelvic fluid collection Aspiration negative  for bacteria.  Completed treatment with IV vancomycin for MRSA bacteremia. (Vancomycin stopped 6-27).  Urine cx growing yeast, fluconazole stopped after 3 days tx.  -now receiving treatment for c. Diff. WBC 8.5  and no fever.  Anemia  Received a total of 4 units PRBC during this admission. Hb stable. Last Hgb 7/7 13.6  hemorrhoids  -General surgery consulted and recommended sitz bath and ice packs for now.  -continue anusol rectal creams.   Hypokalemia  -Will replete as needed and will try to maintain above 4 -some potassium also given with TNA -due GI losses  -will continue with aggressive repletion. K at 3.9, will order one time dose .   Hypertension  Blood pressure stable. Will continue metoprolol IV  Hyperlipidemia  Continue holding statin for now and will resume when tolerating PO   Bladder cancer: S/P surgery.  Treatment per primary team.  Loopogram negative for leak.    Code Status: Full Family Communication: wife and sister at bedside Disposition Plan: SNF vs Home with [facility] services at discharge  Antibiotics:  Vancomycin enemas 07/06/12  IV flagyl 07/06/12  HPI/Subjective: Tolerating clears, no significant abdominal pain.   Objective: Filed Vitals:   07/10/12 0504 07/10/12 1344 07/10/12 2003 07/11/12 0507  BP: 122/89 131/85 115/72 127/78  Pulse: 85 76 75 82  Temp: 98.2 F (36.8 C) 97.8 F (36.6 C) 97.8 F (36.6 C) 97.7 F (36.5 C)  TempSrc: Oral Oral Oral Oral  Resp: 16 18 16    Height:      Weight:      SpO2: 98% 100% 100% 99%    Intake/Output Summary (Last 24 hours) at 07/11/12 1255 Last data filed at 07/11/12 1222  Gross per 24  hour  Intake   3300 ml  Output   4800 ml  Net  -1500 ml   Filed Weights   06/23/12 0405 06/23/12 0449  Weight: 103.2 kg (227 lb 8.2 oz) 100.109 kg (220 lb 11.2 oz)    Exam:   General:  Afebrile, no distress.   Cardiovascular: S1 and S2, no rubs or gallops  Respiratory: CTA bilaterally  Abdomen: softer, less  distended, urostomy bag in place, mid surgical wound w/o signs or infection; less tender to palpation; decreased but positive BS  Musculoskeletal: trace edema bilaterally  Data Reviewed: Basic Metabolic Panel:  Recent Labs Lab 07/05/12 0555 07/06/12 0450 07/07/12 0410 07/08/12 0443 07/09/12 0425 07/10/12 0630 07/11/12 0410  NA 136 137 138 135 134* 133* 139  K 2.9* 3.1* 4.1 4.1 4.0 4.1 3.9  CL 101 100 100 98 101 100 104  CO2 27 29 31 29 27 27 27   GLUCOSE 144* 144* 169* 149* 147* 127* 135*  BUN 13 14 15 18 17 16 15   CREATININE 0.93 0.87 0.89 0.88 0.86 0.89 0.83  CALCIUM 8.6 8.9 9.3 9.4 8.9 8.9 8.8  MG 2.5 2.5  --  2.2  --   --  2.2  PHOS 3.3 3.8  --  4.0  --   --  3.9   CBC:  Recent Labs Lab 07/08/12 0443 07/11/12 0410  WBC 11.2* 8.5  NEUTROABS 8.4*  --   HGB 13.6 13.3  HCT 41.9 41.5  MCV 93.3 94.5  PLT 374 356   CBG:  Recent Labs Lab 07/10/12 2002 07/10/12 2359 07/11/12 0408 07/11/12 0756 07/11/12 1156  GLUCAP 102* 110* 126* 123* 114*    Recent Results (from the past 240 hour(s))  CLOSTRIDIUM DIFFICILE BY PCR     Status: Abnormal   Collection Time    07/06/12 10:05 AM      Result Value Range Status   C difficile by pcr POSITIVE (*) NEGATIVE Final   Comment: CRITICAL RESULT CALLED TO, READ BACK BY AND VERIFIED WITH:     E.HARLESS,RN 1324 07/06/12 EHOWARD     Studies: Dg Abd 2 Views  07/11/2012   *RADIOLOGY REPORT*  Clinical Data: Abdominal pain, diffuse ileus  ABDOMEN - 2 VIEW  Comparison: CT abdomen pelvis dated 07/09/2012  Findings: Multiple dilated loops of small bowel in the mid abdomen with air-fluid levels on the upright view.  Colon is not decompressed.  This appearance is compatible with diffuse adynamic ileus.  Bilateral percutaneous nephrostomy catheters.  Skin staples overlying the right lower pelvis.  Surgical sutures in the right mid abdomen.  IMPRESSION: Suspected stable diffuse adynamic ileus.   Original Report Authenticated By: Charline Bills, M.D.    Scheduled Meds: . famotidine (PEPCID) IV  20 mg Intravenous Q24H  . heparin subcutaneous  5,000 Units Subcutaneous Q8H  . hydrocortisone   Rectal TID  . insulin aspart  0-15 Units Subcutaneous Q4H  . metoCLOPramide (REGLAN) injection  10 mg Intravenous TID AC & HS  . metoprolol  5 mg Intravenous Q6H  . metronidazole  500 mg Intravenous Q8H  . ondansetron  4 mg Intravenous Q4H  . vancomycin  250 mg Oral Q6H   Continuous Infusions: . 0.9 % NaCl with KCl 20 mEq / L 50 mL/hr at 07/10/12 2137  . Marland KitchenTPN (CLINIMIX-E) Adult 80 mL/hr at 07/10/12 1718   And  . fat emulsion 240 mL (07/10/12 1718)  . Marland KitchenTPN (CLINIMIX-E) Adult     And  . fat emulsion  Principal Problem:   Pelvic hematoma, male Active Problems:   Bladder cancer   Acute pulmonary embolism (3/14)   DVT (deep venous thrombosis) 3/14   Fever   Bacteremia MRSA 5/14   VTE (venous thromboembolism)   Protein-calorie malnutrition, severe   Acute blood loss anemia   Gastroparesis    Time spent: < 25 minutes   Dawn Convery  [facility] Pager [phone]. If 7PM-7AM, please contact night-coverage at [email or web address], password [email or web address] 07/11/2012, 12:55 PM  LOS: 19 days

## 2012-07-11 NOTE — Progress Notes (Signed)
CSW met with patient and family at bedside. Discussed that [facility] has retracted their bed offer. Family was upset as they state that [facility] is still their number one choice. However, they understand needing to find a back up choice. CSW stated that CSW would still ask [facility] when patient was ready in case patient's status has changed but the need for another choice is still necessary. Family understands and will review bed offers again.  Chad Olson C. Corderius Saraceni MSW, LCSW [phone]

## 2012-07-11 NOTE — Progress Notes (Signed)
Patient ID: Chad Olson, male   DOB: 1945-04-26, 67 y.o.   MRN: [id number] [facility] Progress Note  Subjective: Resting currently- has been up walking in hall earlier. Sister at bedside- he is taking small amts of clear liquids, no nausea or vomiting with NG tube out. Still no Bm's KUB- persistent  diffuse ileus  Objective:  Vital signs in last 24 hours: Temp:  [97.7 F (36.5 C)-97.8 F (36.6 C)] 97.7 F (36.5 C) (07/10 0507) Pulse Rate:  [75-82] 82 (07/10 0507) Resp:  [16-18] 16 (07/09 2003) BP: (115-131)/(72-85) 127/78 mmHg (07/10 0507) SpO2:  [99 %-100 %] 99 % (07/10 0507) Last BM Date: 07/08/12 General:   Alert,  Well-developed, WM   in NAD Heart:  Regular rate and rhythm; no murmurs Pulm;clear Abdomen:  Soft, , mildly distended, bowel sounds decreased and tinkling, without guarding, and without rebound.   Extremities:  Without edema. Neurologic:  Alert and  oriented x4;  grossly normal neurologically. Psych:  Alert and cooperative. Normal mood and affect.  Intake/Output from previous day: 07/09 0701 - 07/10 0700 In: 2700 [P.O.:700; I.V.:1600; IV Piggyback:400] Out: 4100 [Urine:4100] Intake/Output this shift:    Lab Results:  Recent Labs  07/11/12 0410  WBC 8.5  HGB 13.3  HCT 41.5  PLT 356   BMET  Recent Labs  07/09/12 0425 07/10/12 0630 07/11/12 0410  NA 134* 133* 139  K 4.0 4.1 3.9  CL 101 100 104  CO2 27 27 27   GLUCOSE 147* 127* 135*  BUN 17 16 15   CREATININE 0.86 0.89 0.83  CALCIUM 8.9 8.9 8.8   LFT  Recent Labs  07/11/12 0410  PROT 6.2  ALBUMIN 2.7*  AST 15  ALT 15  ALKPHOS 63  BILITOT 0.3     Assessment / Plan: #1  67 yo male with pesistent diffuse ileus/pelvic hematoma- he is stable, and tolerating NG out, some clears. #2 CDiff- ON IV Flagyl and Vanco enemas- will start oral Vanco today #3 Nutrition- continue TPN     LOS: 19 days   Amy Esterwood  07/11/2012, 10:08  AM  ________________________________________________________________________  [facility] MD note:  I personally examined the patient, reviewed the data and agree with the assessment and plan described above.  Continue clears without advancing further until he starts to have flatus, stool output.  Agree with changing to PO vanco.   Rob Bunting, MD [facility] Pager [phone]

## 2012-07-11 NOTE — Progress Notes (Signed)
PARENTERAL NUTRITION CONSULT NOTE - Follow-Up  Pharmacy Consult for TPN Indication: Gastroparesis/ileus  No Known Allergies  Patient Measurements: Height: 5\' 11"  (180.3 cm) Weight: 220 lb 11.2 oz (100.109 kg) IBW/kg (Calculated) : 75.3  Vital Signs: Temp: 97.7 F (36.5 C) (07/10 0507) Temp src: Oral (07/10 0507) BP: 127/78 mmHg (07/10 0507) Pulse Rate: 82 (07/10 0507) Intake/Output from previous day: 07/09 0701 - 07/10 0700 In: 2700 [P.O.:700; I.V.:1600; IV Piggyback:400] Out: 4100 [Urine:4100]  Labs:  Recent Labs  07/11/12 0410  WBC 8.5  HGB 13.3  HCT 41.5  PLT 356     Recent Labs  07/09/12 0425 07/10/12 0630 07/11/12 0410  NA 134* 133* 139  K 4.0 4.1 3.9  CL 101 100 104  CO2 27 27 27   GLUCOSE 147* 127* 135*  BUN 17 16 15   CREATININE 0.86 0.89 0.83  CALCIUM 8.9 8.9 8.8  MG  --   --  2.2  PHOS  --   --  3.9  PROT  --   --  6.2  ALBUMIN  --   --  2.7*  AST  --   --  15  ALT  --   --  15  ALKPHOS  --   --  63  BILITOT  --   --  0.3   Estimated Creatinine Clearance: 105.5 ml/min (by C-G formula based on Cr of 0.83).    Recent Labs  07/10/12 2359 07/11/12 0408 07/11/12 0756  GLUCAP 110* 126* 123*    Insulin Requirements in the past 24 hours:  8 units Novolog sensitive SSI  Nutritional Goals:  RD recs: 1950-2100 Kcals, 80-95gm protein Clinimix 5/15 at a goal rate of  63ml/hr + 20% fat emulsion at 5ml/hr to provide: 96g/day protein, 1843Kcal/day.  Current Nutrition: Clear Liquid Diet  IVF: NS with KCl 20 mEq/L at 50 ml/hr  Assessment: 67 YO M admitted 6/22 with abdominal pain s/p radical cystoprostatectomy for bladder cancer Feb 2014. On 06/15/12 he required ureteral reimplantation into lleal conduit. He has been unable to tolerate oral diet with persistent N/V, EGD done 7/1 revealed likely gastroparesis, abd films 7/2 consistent with ileus. NGT placed 7/2 with immediate brown-green drainage. Due to poor nutritional intake and ileus, orders  to start TPN 7/2 pm. 7/6 abdominal study shows minimal changes. 7/8 abdominal CT shows: "hematoma shows decreased attenuation but no significant change in size since previous exam. Increased diffuse ileus pattern."  PMH: HTN, lipidemia, GERD, DM (diet controlled), bladder cancer   TPN Day# 9   Glucose - CBGs below goal <150 mg/dL. Has h/o diet-controlled DM  Electrolytes - wnl. K=3.9. Per MD, goal K+ greater than 4.  Will leave that to MD decision if they'd like to supplement that outside the TNA.  Renal: SCr WNL.   NG tube pulled 7/9. Patient now (+) C.diff.  LFTs - WNL (7/7)  TGs - 161 (7/3), 110 (7/7)  Prealbumin - 13.4 (7/3)  TPN Access: PICC   Plan: At 1800 today:  Continue Clinimix E 5/15 at goal rate of 88ml/hr to meet protein goal and 95% of kcal goal.    20% fat emulsion at 19ml/hr.  TNA to contain standard multivitamins and trace elements.  Continue IVF per MD.  Continue current SSI  TNA lab panels on Mondays & Thursdays.  F/u daily.  Darrol Angel, PharmD Pager: [phone] 07/11/2012 10:30 AM

## 2012-07-12 ENCOUNTER — Encounter (HOSPITAL_COMMUNITY)
Admission: EM | Disposition: A | Discharge status: Home Health | Payer: Self-pay | Source: Home / Self Care | Attending: Urology

## 2012-07-12 ENCOUNTER — Encounter (HOSPITAL_COMMUNITY): Payer: Self-pay | Admitting: *Deleted

## 2012-07-12 DIAGNOSIS — K56609 Unspecified intestinal obstruction, unspecified as to partial versus complete obstruction: Secondary | ICD-10-CM | POA: Diagnosis present

## 2012-07-12 HISTORY — PX: FLEXIBLE SIGMOIDOSCOPY: SHX5431

## 2012-07-12 LAB — GLUCOSE, CAPILLARY
Glucose-Capillary: 115 mg/dL — ABNORMAL HIGH (ref 70–99)
Glucose-Capillary: 121 mg/dL — ABNORMAL HIGH (ref 70–99)
Glucose-Capillary: 130 mg/dL — ABNORMAL HIGH (ref 70–99)
Glucose-Capillary: 136 mg/dL — ABNORMAL HIGH (ref 70–99)

## 2012-07-12 LAB — BASIC METABOLIC PANEL
BUN: 15 mg/dL (ref 6–23)
Creatinine, Ser: 0.88 mg/dL (ref 0.50–1.35)
GFR calc Af Amer: >90 mL/min (ref >90)
GFR calc non Af Amer: 88 mL/min — ABNORMAL LOW (ref >90)
Glucose, Bld: 135 mg/dL — ABNORMAL HIGH (ref 70–99)
Potassium: 3.8 mEq/L (ref 3.5–5.1)

## 2012-07-12 SURGERY — SIGMOIDOSCOPY, FLEXIBLE
Anesthesia: Moderate Sedation

## 2012-07-12 MED ORDER — MIDAZOLAM HCL 10 MG/2ML IJ SOLN
INTRAMUSCULAR | Status: AC
  Filled 2012-07-12: qty 2

## 2012-07-12 MED ORDER — SODIUM CHLORIDE 0.9 % IV SOLN: INTRAVENOUS | Status: DC

## 2012-07-12 MED ORDER — FENTANYL CITRATE 0.05 MG/ML IJ SOLN
INTRAMUSCULAR | Status: DC | PRN
  Administered 2012-07-12 (×2): 25 ug via INTRAVENOUS

## 2012-07-12 MED ORDER — TRACE MINERALS CR-CU-F-FE-I-MN-MO-SE-ZN IV SOLN
INTRAVENOUS | Status: AC
  Administered 2012-07-12: 17:00:00 via INTRAVENOUS
  Filled 2012-07-12: qty 2000

## 2012-07-12 MED ORDER — MIDAZOLAM HCL 10 MG/2ML IJ SOLN
INTRAMUSCULAR | Status: DC | PRN
  Administered 2012-07-12 (×3): 1 mg via INTRAVENOUS
  Administered 2012-07-12: 2 mg via INTRAVENOUS
  Administered 2012-07-12: 1 mg via INTRAVENOUS

## 2012-07-12 MED ORDER — FENTANYL CITRATE 0.05 MG/ML IJ SOLN
INTRAMUSCULAR | Status: AC
  Filled 2012-07-12: qty 2

## 2012-07-12 MED ORDER — POTASSIUM CHLORIDE CRYS ER 20 MEQ PO TBCR
40.0000 meq | EXTENDED_RELEASE_TABLET | Freq: Once | ORAL | Status: AC
  Administered 2012-07-12: 40 meq via ORAL
  Filled 2012-07-12: qty 2

## 2012-07-12 MED ORDER — FAT EMULSION 20 % IV EMUL
240.0000 mL | INTRAVENOUS | Status: AC
  Administered 2012-07-12: 240 mL via INTRAVENOUS
  Filled 2012-07-12: qty 250

## 2012-07-12 NOTE — Interval H&P Note (Signed)
History and Physical Interval Note:  07/12/2012 9:10 AM  Chad Olson  has presented today for surgery, with the diagnosis of obstructed rectum  The various methods of treatment have been discussed with the patient and family. After consideration of risks, benefits and other options for treatment, the patient has consented to  Procedure(s): FLEXIBLE SIGMOIDOSCOPY (N/A) as a surgical intervention .  The patient's history has been reviewed, patient examined, no change in status, stable for surgery.  I have reviewed the patient's chart and labs.  Questions were answered to the patient's satisfaction.     Rob Bunting

## 2012-07-12 NOTE — Progress Notes (Signed)
[facility] PROGRESS NOTE  Chad Olson NWG:[id number] DOB: 19-Feb-1945 DOA: 06/22/2012 PCP: [name], MD    Assessment/Plan:  Nausea/Vomiting: Probably secondary to ileus.  S/P endoscopy 7-1 show: The mucosa of the esophagus appeared normal; likely esophageal Spasm. Fluid/food residue in the gastric fundus, gastric body, and gastric antrum consistent with gastroparesis (felt, at least in part, to explain nausea). Now with positive C. Diff results. -GI recommend Reglan QID, limit narcotics and continue  ambulation.  -continue antiemetics and Pepcid -continue tx for C. diff  iIleus/poor nutrition: -Worsening abdominal distension 7-11. Dr Christella Hartigan perform Flex sigmoidoscopy 7-11 for rectal tube placement and decompression. Hematoma felt to be compressing rectum. -NGT discontinue 7-9. Started on clear diet.  -continue reglan -will continue TNA for nutrition bridging -Continue electrolytes repletion (Goal is K as close to 4 as possible)   C. Diff colitis -due to ileus and inability to tolerate PO, qualify in complicated case of C. Diff by protocol. -will continue tx with  IV flagyl, vancomycin change to oral.   Pelvic hematoma: Patient relates worsening abdominal pain. Hematoma stable by CT scan. No orther pathology by CT scan. Monitor hb on heparin. Abdominal pain multifactorial, including hematoma. -repeated CT abd to check for stability on 7/8 demonstrated no increase in size and no significant compromise with intestine. -Repeat CT 7-8 stable pelvic hematoma.  -Probably causing rectal compression.   Hx of DVT and bilateral PE :  -repeated CT scan hematoma stable. HB stable at 11. Patient was started on prophylaxis dose heparin.  -Recent Doppler of his lower extremity was negative for DVT.  -Repeated a CT angiogram of the chest which was negative for PE.  -Patient received 3 Month treatment for PE.  -Appreciate Dr Cyndie Chime recommendation. No indication for IVC filter.   -Will continue heparin for DVT prophylaxis  Fever with mild leucocytosis: Resolved.  Pelvic fluid collection Aspiration negative for bacteria.  Completed treatment with IV vancomycin for MRSA bacteremia. (Vancomycin stopped 6-27).  Urine cx growing yeast, fluconazole stopped after 3 days tx.  -now receiving treatment for c. Diff. WBC 8.5  and no fever.  Anemia  Received a total of 4 units PRBC during this admission. Hb stable. Last Hgb 7/10 at  13.3  hemorrhoids  -General surgery consulted and recommended sitz bath and ice packs for now.  -continue anusol rectal creams.   Hypokalemia  -Will replete as needed and will try to maintain above 4 -potassium also given with TNA -due GI losses  -K at 3.8, will order one time dose of KCL.   Hypertension  Blood pressure stable. Will continue metoprolol IV  Hyperlipidemia  Continue holding statin for now and will resume when tolerating PO   Bladder cancer: S/P surgery.  Treatment per primary team.  Loopogram negative for leak.    Code Status: Full Family Communication: wife and sister at bedside Disposition Plan: to be determine.  Antibiotics:  Vancomycin enemas 07/06/12  IV flagyl 07/06/12  HPI/Subjective: Sleepy, just came from endo. Open eyes, denies abdominal pain.  Family at bedside.   Objective: Filed Vitals:   07/12/12 1040 07/12/12 1045 07/12/12 1050 07/12/12 1112  BP: 117/62 118/87 107/76 125/71  Pulse: 77 78 65 82  Temp:    97.5 F (36.4 C)  TempSrc:    Oral  Resp: 19 19 27 18   Height:      Weight:      SpO2: 98% 97% 97% 100%    Intake/Output Summary (Last 24 hours) at 07/12/12 1333 Last  data filed at 07/12/12 1300  Gross per 24 hour  Intake 7039.83 ml  Output   4700 ml  Net 2339.83 ml   Filed Weights   06/23/12 0405 06/23/12 0449  Weight: 103.2 kg (227 lb 8.2 oz) 100.109 kg (220 lb 11.2 oz)    Exam:   General:  Afebrile, no distress.   Cardiovascular: S1 and S2, no rubs or  gallops  Respiratory: CTA bilaterally  Abdomen: softer, less distended, urostomy bag in place, mid surgical wound w/o signs or infection; less tender to palpation; decreased  BS.  Musculoskeletal: trace edema bilaterally  Data Reviewed: Basic Metabolic Panel:  Recent Labs Lab 07/06/12 0450  07/08/12 0443 07/09/12 0425 07/10/12 0630 07/11/12 0410 07/12/12 1125  NA 137  < > 135 134* 133* 139 135  K 3.1*  < > 4.1 4.0 4.1 3.9 3.8  CL 100  < > 98 101 100 104 103  CO2 29  < > 29 27 27 27 26   GLUCOSE 144*  < > 149* 147* 127* 135* 135*  BUN 14  < > 18 17 16 15 15   CREATININE 0.87  < > 0.88 0.86 0.89 0.83 0.88  CALCIUM 8.9  < > 9.4 8.9 8.9 8.8 8.8  MG 2.5  --  2.2  --   --  2.2  --   PHOS 3.8  --  4.0  --   --  3.9  --   < > = values in this interval not displayed. CBC:  Recent Labs Lab 07/08/12 0443 07/11/12 0410  WBC 11.2* 8.5  NEUTROABS 8.4*  --   HGB 13.6 13.3  HCT 41.9 41.5  MCV 93.3 94.5  PLT 374 356   CBG:  Recent Labs Lab 07/11/12 1955 07/12/12 0002 07/12/12 0402 07/12/12 0742 07/12/12 1142  GLUCAP 136* 115* 130* 122* 113*    Recent Results (from the past 240 hour(s))  CLOSTRIDIUM DIFFICILE BY PCR     Status: Abnormal   Collection Time    07/06/12 10:05 AM      Result Value Range Status   C difficile by pcr POSITIVE (*) NEGATIVE Final   Comment: CRITICAL RESULT CALLED TO, READ BACK BY AND VERIFIED WITH:     E.HARLESS,RN 1324 07/06/12 EHOWARD     Studies: Dg Abd 2 Views  07/11/2012   *RADIOLOGY REPORT*  Clinical Data: Abdominal pain, diffuse ileus  ABDOMEN - 2 VIEW  Comparison: CT abdomen pelvis dated 07/09/2012  Findings: Multiple dilated loops of small bowel in the mid abdomen with air-fluid levels on the upright view.  Colon is not decompressed.  This appearance is compatible with diffuse adynamic ileus.  Bilateral percutaneous nephrostomy catheters.  Skin staples overlying the right lower pelvis.  Surgical sutures in the right mid abdomen.   IMPRESSION: Suspected stable diffuse adynamic ileus.   Original Report Authenticated By: Charline Bills, M.D.    Scheduled Meds: . famotidine (PEPCID) IV  20 mg Intravenous Q24H  . heparin subcutaneous  5,000 Units Subcutaneous Q8H  . hydrocortisone   Rectal TID  . insulin aspart  0-15 Units Subcutaneous Q4H  . metoCLOPramide (REGLAN) injection  10 mg Intravenous TID AC & HS  . metoprolol  5 mg Intravenous Q6H  . metronidazole  500 mg Intravenous Q8H  . ondansetron  4 mg Intravenous Q4H  . potassium chloride  40 mEq Oral Once  . vancomycin  250 mg Oral Q6H   Continuous Infusions: . 0.9 % NaCl with KCl 20 mEq / L 50  mL/hr at 07/11/12 2031  . Marland KitchenTPN (CLINIMIX-E) Adult 80 mL/hr at 07/11/12 1821   And  . fat emulsion 240 mL (07/11/12 1821)  . Marland KitchenTPN (CLINIMIX-E) Adult     And  . fat emulsion      Principal Problem:   Pelvic hematoma, male Active Problems:   Bladder cancer   Acute pulmonary embolism (3/14)   DVT (deep venous thrombosis) 3/14   Fever   Bacteremia MRSA 5/14   VTE (venous thromboembolism)   Protein-calorie malnutrition, severe   Acute blood loss anemia   Gastroparesis   Colonic obstruction    Time spent: < 25 minutes   Mable Lashley  [facility] Pager [phone]. If 7PM-7AM, please contact night-coverage at [email or web address], password [facility] 07/12/2012, 1:33 PM  LOS: 20 days

## 2012-07-12 NOTE — Progress Notes (Signed)
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 Healed well initially and sent home 6/17. Loopogram 6/27 without internal leak.  2 - Fever / h/o Bacteruria - Pt with MRSA in urine by hospital UCX 03/2012 and again subsequently in blood. Now on Vancomycin daily per ID x 4-6 week total (nearing end of course). Most recent UCX, BCX negative, New set pending from 6/22. Pt readmitted with low-grade fever 6/22 to 100.7 and malaise, no sig leukocytosis. Now on Vanc + Zosyn emirically. UCX with yeast (trated diflucan x 3 days), BCX no growth to date.Pelvic fluid aspirate and no growth / final.  ID now following,and DC'd all ABX given eval except for current C. Diff treatment.  3 - Heme / Recent DVT + PE -incidental PE by CT 03/2012 now on Lovenox, DVT resolved by most recent LE duplex. Repeat CT-PE 6/23 without PE or active bleed from pelvis and Lovenox DC'd. Hgb drift to 7 6/24 and transfused 2 u with response to 8, transfused 2u additional 6/25 with increase to 10. Hematology consult [name]) also agrees no treatment dose anticoagulation at this point, but resume proph dosing since he is not ambulating much.  4 - Pelvic Fluid Collection - Pelvic fluid collection without rim-enhancement noted on CT from ER 6/22. Appears partially simple fluid with some dependent blood by HU analysis, no active extrav of blood or urine by imaging. IR aspiration performed 6/26 and fluid c/w hematoma only (not purulent, not simple/drainable).   5 - Hemorrhoids / Lower GI Bleed - Long h/o hemorroids. Pt with worsened / friable bleeding hemorroids on  admission that are quite bothersome. No prior specific intervention, likely exacerbated by pelvic fluid collection / hematoma. Gen Surg recs conservative measures and now nearly resolved clinically.  6 - Nutrition / Nausea / C. Difficile- Pt with some element of baseline GI issues with GERD and prior small esophageal polyp. Has had exacerbation of nausea in house and not meeting nutritional goals. GI now involved and EGD + KUB with ileus / gastroparesis picture. No high-grade mechanical obstruction given continued stool + gas. While ileus resolving placed on TPN and NGT for symptom relief and to bridge him with nutrition. C. Diff found positive 7/5 and started on dual therapy with flagyl + vanc enemas with plan for 14 day course. NGT DC'd 7/9 as output minimal.  7 - Disposition - PT eval has recommended DC to SNF when appropriate and family amenable.  PMH sig for obesity and left knee replacement, PE + DVT (now resolved). No CV disease.   Today Aarit complains of somewhat worsened abd distention with mild nausea. No emesis. GI eval this AM recs   Objective: Vital signs in last 24 hours: Temp:  [97.4 F (36.3 C)-98.1 F (36.7 C)] 97.4 F (36.3 C) (07/11 0554) Pulse Rate:  [80-92] 83 (07/11 0554) Resp:  [16-18] 16 (07/11 0554) BP: (124-134)/(84-94) 134/94 mmHg (07/11 0554) SpO2:  [97 %-100 %] 100 % (07/11 0554) Last BM Date: 07/08/12  Intake/Output from previous day: 07/10 0701 - 07/11 0700 In: 7799.8 [P.O.:920; I.V.:1150.8; IV Piggyback:350; TPN:5379] Out: 3900 [Urine:3900] Intake/Output this shift:    General appearance: alert, cooperative and appears stated age Head: Normocephalic, without obvious abnormality, atraumatic Eyes: conjunctivae/corneas  clear. PERRL, EOM's intact. Fundi benign. Ears: normal TM's and external ear canals both ears Nose: Nares normal. Septum midline. Mucosa normal. No drainage or sinus tenderness. Throat: lips, mucosa, and tongue normal; teeth and gums  normal Neck: no adenopathy, no carotid bruit, no JVD, supple, symmetrical, trachea midline and thyroid not enlarged, symmetric, no tenderness/mass/nodules Back: symmetric, no curvature. ROM normal. No CVA tenderness. Resp: clear to auscultation bilaterally Chest wall: no tenderness Cardio: regular rate and rhythm, S1, S2 normal, no murmur, click, rub or gallop GI: soft, non-tender; bowel sounds normal; no masses,  no organomegaly and Abd without significant increase in distension on exam. No increased typany. No rebound / guarding. Male genitalia: normal Extremities: extremities normal, atraumatic, no cyanosis or edema and RUE PICC c/d/i. Pulses: 2+ and symmetric Skin: Skin color, texture, turgor normal. No rashes or lesions Lymph nodes: Cervical, supraclavicular, and axillary nodes normal. Neurologic: Grossly normal Incision/Wound: Recent lower midline incision c/d/i. RLQ urostomy pink / patent with copious clear urine.  Lab Results:   Recent Labs  2012/07/31 0410  WBC 8.5  HGB 13.3  HCT 41.5  PLT 356   BMET  Recent Labs  07/10/12 0630 31-Jul-2012 0410  NA 133* 139  K 4.1 3.9  CL 100 104  CO2 27 27  GLUCOSE 127* 135*  BUN 16 15  CREATININE 0.89 0.83  CALCIUM 8.9 8.8   PT/INR No results found for this basename: LABPROT, INR,  in the last 72 hours ABG No results found for this basename: PHART, PCO2, PO2, HCO3,  in the last 72 hours  Studies/Results: Dg Abd 2 Views  07/31/2012   *RADIOLOGY REPORT*  Clinical Data: Abdominal pain, diffuse ileus  ABDOMEN - 2 VIEW  Comparison: CT abdomen pelvis dated 07/09/2012  Findings: Multiple dilated loops of small bowel in the mid abdomen with air-fluid levels on the upright view.  Colon is not decompressed.  This appearance is compatible with diffuse adynamic ileus.  Bilateral percutaneous nephrostomy catheters.  Skin staples overlying the right lower pelvis.  Surgical sutures in the right mid abdomen.  IMPRESSION: Suspected stable diffuse  adynamic ileus.   Original Report Authenticated By: Charline Bills, M.D.    Anti-infectives: Anti-infectives   Start     Dose/Rate Route Frequency Ordered Stop   07/31/2012 1200  vancomycin (VANCOCIN) 50 mg/mL oral solution 250 mg     250 mg Oral 4 times per day 31-Jul-2012 1013     07/06/12 1800  vancomycin (VANCOCIN) 500 mg in sodium chloride irrigation 0.9 % 100 mL ENEMA  Status:  Discontinued     500 mg Rectal 4 times per day 07/06/12 1335 Jul 31, 2012 1244   07/06/12 1430  metroNIDAZOLE (FLAGYL) IVPB 500 mg     500 mg 100 mL/hr over 60 Minutes Intravenous 3 times per day 07/06/12 1335     06/25/12 1000  vancomycin (VANCOCIN) 1,250 mg in sodium chloride 0.9 % 250 mL IVPB  Status:  Discontinued     1,250 mg 166.7 mL/hr over 90 Minutes Intravenous Every 12 hours 06/25/12 0856 06/28/12 1102   06/24/12 2000  fluconazole (DIFLUCAN) IVPB 200 mg     200 mg 100 mL/hr over 60 Minutes Intravenous Every 24 hours 06/24/12 1733 06/26/12 2322   06/23/12 0900  vancomycin (VANCOCIN) 1,250 mg in sodium chloride 0.9 % 250 mL IVPB     1,250 mg 166.7 mL/hr over 90 Minutes Intravenous Every 12 hours 06/23/12 0522 06/24/12 2231   06/23/12 0600  piperacillin-tazobactam (ZOSYN) IVPB 3.375 g  Status:  Discontinued     3.375 g 12.5 mL/hr over 240 Minutes Intravenous 3 times per day 06/23/12 0447 06/28/12 1102   06/23/12 0330  cefTRIAXone (ROCEPHIN) 1 g in dextrose 5 % 50 mL IVPB     1 g 100 mL/hr over 30 Minutes Intravenous  Once 06/23/12 0319 06/23/12 0440      Assessment/Plan:  1 - Bladder Cancer / Cystectomy / Ureteral Revision- NED form cancer perspective.Loopogram without leak this admission. WIll consider staple removal and left neph tube removal under fluoro later this admission when back to baseline in terms of GI status.  2 - Fever / h/o Bacteruria - Now off wound-specific ABX given negative CX from blood, urine, pelvic fluid.  3 - Heme / Recent DVT + PE -Lovenox now stopped. SCD's in place.  . PT  following. Remain proph SQ heparin + SCD's. He is now making much more progress in terms of ambulation and PT.  4 - Pelvic Fluid Collection - Imaging and aspirate c/w non-infected hemaotma. Size stable / decreasing as expected. Eplained to pt this will resolve, but slowly (weeks-mos).  5 - Hemorrhoids / Lower GI Bleed - Agree with conservative measures for now. Hgb stable.  6 - Nutrition / Nausea / C. Difficile- Remains central active issue. Agree with aggressive treatment for C. Diff. Encouraged by tollerating NGT clamping and clears. KUB with some radiographic persistance of ileus picture. Agree with lower endoscopic evalutiaon and possible decompression tube to maximize flow thorugh lower tract adn help minimize any mass-effect from hematoma, which would otherwise appear to be resolving (less hemorroids, etc...)  7 - Disposition - SNF pending nutritional status and ileus resolution.  Greatly appreciate hospitalist, gen surgery, ID, hematology,PT, GI, TPN pharmacy input.  [facility], Garey Alleva 07/12/2012

## 2012-07-12 NOTE — Progress Notes (Signed)
PARENTERAL NUTRITION CONSULT NOTE - Follow-Up  Pharmacy Consult for TPN Indication: Gastroparesis/ileus  No Known Allergies  Patient Measurements: Height: 5\' 11"  (180.3 cm) Weight: 220 lb 11.2 oz (100.109 kg) IBW/kg (Calculated) : 75.3  Vital Signs: Temp: 97.4 F (36.3 C) (07/11 0554) Temp src: Oral (07/11 0554) BP: 107/76 mmHg (07/11 1050) Pulse Rate: 65 (07/11 1050) Intake/Output from previous day: 07/10 0701 - 07/11 0700 In: 7799.8 [P.O.:920; I.V.:1150.8; IV Piggyback:350; TPN:5379] Out: 3900 [Urine:3900]  Labs:  Recent Labs  07/11/12 0410  WBC 8.5  HGB 13.3  HCT 41.5  PLT 356     Recent Labs  07/10/12 0630 07/11/12 0410  NA 133* 139  K 4.1 3.9  CL 100 104  CO2 27 27  GLUCOSE 127* 135*  BUN 16 15  CREATININE 0.89 0.83  CALCIUM 8.9 8.8  MG  --  2.2  PHOS  --  3.9  PROT  --  6.2  ALBUMIN  --  2.7*  AST  --  15  ALT  --  15  ALKPHOS  --  63  BILITOT  --  0.3   Estimated Creatinine Clearance: 105.5 ml/min (by C-G formula based on Cr of 0.83).    Recent Labs  07/12/12 0002 07/12/12 0402 07/12/12 0742  GLUCAP 115* 130* 122*    Insulin Requirements in the past 24 hours:  8 units Novolog sensitive SSI  Nutritional Goals:  RD recs: 1950-2100 Kcals, 80-95gm protein Clinimix 5/15 at a goal rate of  59ml/hr + 20% fat emulsion at 57ml/hr to provide: 96g/day protein, 1843Kcal/day.  Current Nutrition: Clear Liquid Diet  IVF: NS with KCl 20 mEq/L at 50 ml/hr (per MD)  Assessment: 67 YO M admitted 6/22 with abdominal pain s/p radical cystoprostatectomy for bladder cancer Feb 2014. On 06/15/12 he required ureteral reimplantation into lleal conduit. He has been unable to tolerate oral diet with persistent N/V, EGD done 7/1 revealed likely gastroparesis, abd films 7/2 consistent with ileus. NGT placed 7/2 with immediate brown-green drainage. Due to poor nutritional intake and ileus, orders to start TPN 7/2 pm. 7/6 abdominal study shows minimal changes.  7/8 abdominal CT shows: "hematoma shows decreased attenuation but no significant change in size since previous exam. Increased diffuse ileus pattern." For flex sig today.  PMH: HTN, lipidemia, GERD, DM (diet controlled), bladder cancer   TPN Day# 10   Glucose - CBGs below goal <150 mg/dL. Has h/o diet-controlled DM  Electrolytes - No BMET today. Per MD, goal K+ greater than 4.   Renal: SCr WNL on 7/10  NG tube pulled 7/9. Patient now (+) C.diff.  LFTs - WNL (7/7)  TGs - 161 (7/3), 110 (7/7)  Prealbumin - 13.4 (7/3)  Patient +3.9 L yesterday  TPN Access: PICC   Plan: At 1800 today:  Continue Clinimix E 5/15 at goal rate of 72ml/hr to meet protein goal and 95% of kcal goal.    20% fat emulsion at 32ml/hr.  TNA to contain standard multivitamins and trace elements.  Continue IVF per MD.  Continue current SSI  TNA lab panels on Mondays & Thursdays.  F/u daily.  Darrol Angel, PharmD Pager: [phone] 07/12/2012 10:53 AM

## 2012-07-12 NOTE — Progress Notes (Addendum)
[facility] Progress Note    Since last GI note: Ambulating. Still no flatus.  No BM.  No vomiting but abd more distended this AM and starting to hurt him again  Objective: Vital signs in last 24 hours: Temp:  [97.4 F (36.3 C)-98.1 F (36.7 C)] 97.4 F (36.3 C) (07/11 0554) Pulse Rate:  [80-92] 83 (07/11 0554) Resp:  [16-18] 16 (07/11 0554) BP: (124-134)/(84-94) 134/94 mmHg (07/11 0554) SpO2:  [97 %-100 %] 100 % (07/11 0554) Last BM Date: 07/08/12 General: alert and oriented times 3 Heart: regular rate and rythm Abdomen: soft, non-tender, non-distended, + but hypoactive bowel sounds   Lab Results:  Recent Labs  07/11/12 0410  WBC 8.5  HGB 13.3  PLT 356  MCV 94.5    Recent Labs  07/10/12 0630 07/11/12 0410  NA 133* 139  K 4.1 3.9  CL 100 104  CO2 27 27  GLUCOSE 127* 135*  BUN 16 15  CREATININE 0.89 0.83  CALCIUM 8.9 8.8    Recent Labs  07/11/12 0410  PROT 6.2  ALBUMIN 2.7*  AST 15  ALT 15  ALKPHOS 63  BILITOT 0.3      Medications: Scheduled Meds: . famotidine (PEPCID) IV  20 mg Intravenous Q24H  . heparin subcutaneous  5,000 Units Subcutaneous Q8H  . hydrocortisone   Rectal TID  . insulin aspart  0-15 Units Subcutaneous Q4H  . metoCLOPramide (REGLAN) injection  10 mg Intravenous TID AC & HS  . metoprolol  5 mg Intravenous Q6H  . metronidazole  500 mg Intravenous Q8H  . ondansetron  4 mg Intravenous Q4H  . vancomycin  250 mg Oral Q6H   Continuous Infusions: . 0.9 % NaCl with KCl 20 mEq / L 50 mL/hr at 07/11/12 2031  . .TPN (CLINIMIX-E) Adult 80 mL/hr at 07/11/12 1821   And  . fat emulsion 240 mL (07/11/12 1821)   PRN Meds:.promethazine, sodium chloride, sodium chloride, witch hazel-glycerin    Assessment/Plan: 67 y.o. male with dilated colon, small bowel  If this is an ileus it is pretty prolonged, unusual.  He has a 13cm blood clot in pelvis that may or may not be compressing rectum, adding to his bowel issues with  obstruction. There was no clear transition point on CT scan earlier this week but I still wonder if the blood clot is compressing rectum.  I will discuss films with radiologist this AM and decide flex sig with decompression tube vs. Replacement of ng tube.  Family aware.    Daniel Jacobs, MD  07/12/2012, 7:31 AM [facility] Pager [phone]    Addendum 8:05am  I reviewed films with another radiologist and we are both concerned that the pelvic hematoma is causing mass effect, pressure on rectum. Will plan for flex sig, decompression tube placement this AM. 

## 2012-07-12 NOTE — H&P (View-Only) (Signed)
[facility] Progress Note    Since last GI note: Ambulating. Still no flatus.  No BM.  No vomiting but abd more distended this AM and starting to hurt him again  Objective: Vital signs in last 24 hours: Temp:  [97.4 F (36.3 C)-98.1 F (36.7 C)] 97.4 F (36.3 C) (07/11 0554) Pulse Rate:  [80-92] 83 (07/11 0554) Resp:  [16-18] 16 (07/11 0554) BP: (124-134)/(84-94) 134/94 mmHg (07/11 0554) SpO2:  [97 %-100 %] 100 % (07/11 0554) Last BM Date: 07/08/12 General: alert and oriented times 3 Heart: regular rate and rythm Abdomen: soft, non-tender, non-distended, + but hypoactive bowel sounds   Lab Results:  Recent Labs  07/11/12 0410  WBC 8.5  HGB 13.3  PLT 356  MCV 94.5    Recent Labs  07/10/12 0630 07/11/12 0410  NA 133* 139  K 4.1 3.9  CL 100 104  CO2 27 27  GLUCOSE 127* 135*  BUN 16 15  CREATININE 0.89 0.83  CALCIUM 8.9 8.8    Recent Labs  07/11/12 0410  PROT 6.2  ALBUMIN 2.7*  AST 15  ALT 15  ALKPHOS 63  BILITOT 0.3      Medications: Scheduled Meds: . famotidine (PEPCID) IV  20 mg Intravenous Q24H  . heparin subcutaneous  5,000 Units Subcutaneous Q8H  . hydrocortisone   Rectal TID  . insulin aspart  0-15 Units Subcutaneous Q4H  . metoCLOPramide (REGLAN) injection  10 mg Intravenous TID AC & HS  . metoprolol  5 mg Intravenous Q6H  . metronidazole  500 mg Intravenous Q8H  . ondansetron  4 mg Intravenous Q4H  . vancomycin  250 mg Oral Q6H   Continuous Infusions: . 0.9 % NaCl with KCl 20 mEq / L 50 mL/hr at 07/11/12 2031  . Marland KitchenTPN (CLINIMIX-E) Adult 80 mL/hr at 07/11/12 1821   And  . fat emulsion 240 mL (07/11/12 1821)   PRN Meds:.promethazine, sodium chloride, sodium chloride, witch hazel-glycerin    Assessment/Plan: 67 y.o. male with dilated colon, small bowel  If this is an ileus it is pretty prolonged, unusual.  He has a 13cm blood clot in pelvis that may or may not be compressing rectum, adding to his bowel issues with  obstruction. There was no clear transition point on CT scan earlier this week but I still wonder if the blood clot is compressing rectum.  I will discuss films with radiologist this AM and decide flex sig with decompression tube vs. Replacement of ng tube.  Family aware.    Rob Bunting, MD  07/12/2012, 7:31 AM [facility] Pager [phone]    Addendum 8:05am  I reviewed films with another radiologist and we are both concerned that the pelvic hematoma is causing mass effect, pressure on rectum. Will plan for flex sig, decompression tube placement this AM.

## 2012-07-12 NOTE — Progress Notes (Signed)
PT Cancellation Note  Patient Details Name: Chad Olson MRN: [id number] DOB: January 31, 1945   Cancelled Treatment:    Reason Eval/Treat Not Completed: Patient at procedure or test/unavailable   Donnetta Hail 07/12/2012, 1:17 PM

## 2012-07-12 NOTE — Op Note (Signed)
[facility] [address] [place], [address]   FLEX SIGMOIDOSCOPY PROCEDURE REPORT PATIENT: Chad Olson, Chad Olson  MR#: [id number] BIRTHDATE: 1945-09-08 , 67  yrs. old GENDER: Male ENDOSCOPIST: Rachael Fee, MD PROCEDURE DATE:  07/12/2012 PROCEDURE:   Sigmoidoscopy, with rectal tube placement for decompression INDICATIONS:urologic surgery 5-6 weeks ago, complicated by large pelvic hematoma; prolonged hospital stay; hematoma now felt to be compressing rectum, obstructing. MEDICATIONS: Fentanyl 50 mcg IV and Versed 6 mg IV DESCRIPTION OF PROCEDURE:    Physical exam was performed.  Informed consent was obtained from the patient after explaining the benefits, risks, and alternatives to procedure.  The patient was connected to monitor and placed in left lateral position. Continuous oxygen was provided by nasal cannula and IV medicine administered through an indwelling cannula.  After administration of sedation and rectal exam, the patients rectum was intubated and the EG-[id number] ([id number])  colonoscope was advanced under direct visualization to the sigmoid.  The scope was removed slowly by carefully examining the color, texture, anatomy, and integrity mucosa on the way out.  The patient was recovered in endoscopy and discharged home in satisfactory condition.  COLON FINDINGS: The distal sigmoid, very proximal rectum is severely inflammed, tortuous, stenotic along about a 5-7cm course. Initially the lumen was difficult to follow and I was concerned that the nearby hematoma had eroded into the wall of the colon.  I called general surgeon in to the endo suite, using a smaller diameter endoscope I was able to advance into sigmoid colon more proximally.  This bowel looked healthy, dilated, full of liquid stool.  I then reintroduced adult colonscope and suctioned as much are, liquid stool out as posible.  I then used a32 Fr 20 inch long red rubber catheter Bard Rectal Tube  which I modified by cutting three more holes at distal end of the tube.  Using a snare, I advanced the Rectal Tube as proximally as possible so that the tip was well into healthy appearing sigmod.  The other end was secured with tape very well on his left thigh and then it was placed on gravity drainage.  More liquid stool continued to pass through the rectal tube for about a total of 1.3 liters by the end of the procedure. PREP QUALITY: poor CECAL W/D TIME: NA ENDOSCOPIC IMPRESSION: The pelvic hematoma is causing stricture in proximal rectum, distal sigmoid.  The mucosa at the site of narrowing is severely inflammed, friable appearing.  A 32 Fr red rubber Rectal Tube was placed across the stricture site and placed to gravity drainage.  RECOMMENDATIONS: Will follow clinically, keeping the Rectal Tube secured and on gravity drainage.  Will get KUB in AM.  I spoke with Dr.  Berneice Heinrich and he agrees with the plan.  If this fails, other options include rectal stent (given severely inflammed mucosa I would be reluctant to place this), going to OR for colostomy/pelvic hematoma evacuation. n]  _______________________________ eSignedRachael Fee, MD 07/12/2012 10:44 AM

## 2012-07-13 ENCOUNTER — Inpatient Hospital Stay (HOSPITAL_COMMUNITY): Payer: BC Managed Care – PPO

## 2012-07-13 DIAGNOSIS — K56609 Unspecified intestinal obstruction, unspecified as to partial versus complete obstruction: Secondary | ICD-10-CM

## 2012-07-13 LAB — BASIC METABOLIC PANEL
BUN: 14 mg/dL (ref 6–23)
CO2: 23 mEq/L (ref 19–32)
Calcium: 8.5 mg/dL (ref 8.4–10.5)
Chloride: 108 mEq/L (ref 96–112)
Creatinine, Ser: 0.98 mg/dL (ref 0.50–1.35)
GFR calc non Af Amer: 84 mL/min — ABNORMAL LOW (ref >90)
GFR calc non Af Amer: 87 mL/min — ABNORMAL LOW (ref >90)
Glucose, Bld: 133 mg/dL — ABNORMAL HIGH (ref 70–99)
Glucose, Bld: 129 mg/dL — ABNORMAL HIGH (ref 70–99)
Potassium: 3.6 mEq/L (ref 3.5–5.1)
Sodium: 137 mEq/L (ref 135–145)

## 2012-07-13 LAB — GLUCOSE, CAPILLARY
Glucose-Capillary: 130 mg/dL — ABNORMAL HIGH (ref 70–99)
Glucose-Capillary: 148 mg/dL — ABNORMAL HIGH (ref 70–99)

## 2012-07-13 LAB — CBC
MCH: 29.9 pg (ref 26.0–34.0)
MCHC: 31.7 g/dL (ref 30.0–36.0)
MCV: 94.4 fL (ref 78.0–100.0)
Platelets: 283 10*3/uL (ref 150–400)
RBC: 4.31 MIL/uL (ref 4.22–5.81)

## 2012-07-13 MED ORDER — ENSURE COMPLETE PO LIQD
237.0000 mL | Freq: Two times a day (BID) | ORAL | Status: DC
  Administered 2012-07-13 – 2012-07-17 (×8): 237 mL via ORAL

## 2012-07-13 MED ORDER — POTASSIUM CHLORIDE 10 MEQ/100ML IV SOLN
10.0000 meq | INTRAVENOUS | Status: AC
  Administered 2012-07-13 (×4): 10 meq via INTRAVENOUS
  Filled 2012-07-13 (×4): qty 100

## 2012-07-13 MED ORDER — POTASSIUM CHLORIDE CRYS ER 20 MEQ PO TBCR
40.0000 meq | EXTENDED_RELEASE_TABLET | Freq: Once | ORAL | Status: AC
  Administered 2012-07-13: 40 meq via ORAL
  Filled 2012-07-13: qty 2

## 2012-07-13 MED ORDER — TRACE MINERALS CR-CU-F-FE-I-MN-MO-SE-ZN IV SOLN
INTRAVENOUS | Status: DC
  Administered 2012-07-13: 18:00:00 via INTRAVENOUS
  Filled 2012-07-13: qty 2000

## 2012-07-13 MED ORDER — FAT EMULSION 20 % IV EMUL
240.0000 mL | INTRAVENOUS | Status: DC
  Administered 2012-07-13: 240 mL via INTRAVENOUS
  Filled 2012-07-13: qty 250

## 2012-07-13 NOTE — Progress Notes (Signed)
[facility] Progress Note    Since last GI note: Flex sig yesterday with placement of 32Fr 20cm long Rectal tube across severely damaged, narrowed segment of rectum which I presume is related to mass effect from large pelvic hematoma.  EPIC reports almost 5 liters of stool output (1liter in endo and 4 liters since then) into the gravity bag attached to his rectal tube since it's placement.  His abd pain is gone.  No nausea. Spirits improved as well  Objective: Vital signs in last 24 hours: Temp:  [97.2 F (36.2 C)-98 F (36.7 C)] 98 F (36.7 C) (07/12 0457) Pulse Rate:  [65-88] 77 (07/12 0457) Resp:  [13-27] 20 (07/12 0457) BP: (107-149)/(62-91) 129/82 mmHg (07/12 0457) SpO2:  [96 %-100 %] 98 % (07/12 0457) Last BM Date: 07/13/12 General: alert and oriented times 3 Heart: regular rate and rythm Abdomen: soft, non-tender, non-distended, normal bowel sounds   Lab Results:  Recent Labs  07/11/12 0410 07/13/12 0455  WBC 8.5 7.8  HGB 13.3 12.9*  PLT 356 283  MCV 94.5 94.4    Recent Labs  07/11/12 0410 07/12/12 1125 07/13/12 0455  NA 139 135 137  K 3.9 3.8 3.1*  CL 104 103 106  CO2 27 26 23   GLUCOSE 135* 135* 133*  BUN 15 15 15   CREATININE 0.83 0.88 0.98  CALCIUM 8.8 8.8 8.5    Recent Labs  07/11/12 0410  PROT 6.2  ALBUMIN 2.7*  AST 15  ALT 15  ALKPHOS 63  BILITOT 0.3     Medications: Scheduled Meds: . famotidine (PEPCID) IV  20 mg Intravenous Q24H  . heparin subcutaneous  5,000 Units Subcutaneous Q8H  . hydrocortisone   Rectal TID  . insulin aspart  0-15 Units Subcutaneous Q4H  . metoCLOPramide (REGLAN) injection  10 mg Intravenous TID AC & HS  . metoprolol  5 mg Intravenous Q6H  . metronidazole  500 mg Intravenous Q8H  . ondansetron  4 mg Intravenous Q4H  . vancomycin  250 mg Oral Q6H   Continuous Infusions: . 0.9 % NaCl with KCl 20 mEq / L 1,000 mL (07/12/12 2034)  . Marland KitchenTPN (CLINIMIX-E) Adult 80 mL/hr at 07/12/12 1720   And  . fat  emulsion 240 mL (07/12/12 1720)   PRN Meds:.promethazine, sodium chloride, sodium chloride, witch hazel-glycerin    Assessment/Plan: 67 y.o. male 13cm pelvic hematoma causing mass effect, pressure on proximal rectum  The rectal tube is functioning well so far with about 5 liters of liquid output since it was placed.  I don't know how long the tube will continue to function without getting plugged up or causing its own problems (bleeding?).  I doubt it can be in place functioning well for more that 1-2 weeks.  There are removable colonic stents and that could be reconsidered if need be, but currently the rectal mucosa at site of stenosis looks much too damaged, severely inflamed (likely pressure related ischemia) appearing and I fear that a stent could worsen that.  I am going to increase diet to full liquids.     Rob Bunting, MD  07/13/2012, 7:14 AM [facility] Pager [phone]

## 2012-07-13 NOTE — Progress Notes (Deleted)
Pt has been incontinent since 0300 this morning. Pt states that she feels like she has no control over her bladder. Pt was able to void on own at home.

## 2012-07-13 NOTE — Progress Notes (Signed)
Patient ID: Chad Olson, male   DOB: [birth year], 67 y.o.   MRN: [id number] 1 Day Post-Op Subjective: Patient reports feeling better. He did have rectal tube placed by Dr. Christella Hartigan yesterday with large output of fecal material. This has improved his abdominal distention and discomfort. Appreciate GI input and help. Not clear how this will pan out over the next week or 2 as this hematoma needs to resolve. There are no new complaints in his night last night was uneventful.  Objective: Vital signs in last 24 hours: Temp:  [97.2 F (36.2 C)-98 F (36.7 C)] 98 F (36.7 C) (07/12 0457) Pulse Rate:  [65-88] 77 (07/12 0457) Resp:  [13-27] 20 (07/12 0457) BP: (107-149)/(62-91) 129/82 mmHg (07/12 0457) SpO2:  [96 %-100 %] 98 % (07/12 0457)  Intake/Output from previous day: 07/11 0701 - 07/12 0700 In: 790 [P.O.:790] Out: 6925 [Urine:3000; Stool:3925] Intake/Output this shift:    Physical Exam:  Constitutional: Vital signs reviewed. WD WN in NAD   Eyes: PERRL, No scleral icterus.   Cardiovascular: RRR Pulmonary/Chest: Normal effort Abdominal: Soft. Non-tender. Some bowel activity Genitourinary: Stoma appears pink and viable. Both stents indwelling. Extremities: No cyanosis or edema   Lab Results:  Recent Labs  07/11/12 0410 07/13/12 0455  HGB 13.3 12.9*  HCT 41.5 40.7   BMET  Recent Labs  07/12/12 1125 07/13/12 0455  NA 135 137  K 3.8 3.1*  CL 103 106  CO2 26 23  GLUCOSE 135* 133*  BUN 15 15  CREATININE 0.88 0.98  CALCIUM 8.8 8.5   No results found for this basename: LABPT, INR,  in the last 72 hours No results found for this basename: LABURIN,  in the last 72 hours Results for orders placed during the hospital encounter of 06/22/12  CULTURE, BLOOD (ROUTINE X 2)     Status: None   Collection Time    06/22/12 10:43 PM      Result Value Range Status   Specimen Description BLOOD LEFT ANTECUBITAL   Final   Special Requests BOTTLES DRAWN AEROBIC AND ANAEROBIC 5CC    Final   Culture  Setup Time 06/23/2012 17:47   Final   Culture NO GROWTH 5 DAYS   Final   Report Status 06/29/2012 FINAL   Final  CULTURE, BLOOD (ROUTINE X 2)     Status: None   Collection Time    06/22/12 10:57 PM      Result Value Range Status   Specimen Description BLOOD LEFT ARM   Final   Special Requests BOTTLES DRAWN AEROBIC AND ANAEROBIC 10CC   Final   Culture  Setup Time 06/23/2012 17:47   Final   Culture NO GROWTH 5 DAYS   Final   Report Status 06/29/2012 FINAL   Final  URINE CULTURE     Status: None   Collection Time    06/23/12  2:17 AM      Result Value Range Status   Specimen Description URINE, CATHETERIZED   Final   Special Requests Normal   Final   Culture  Setup Time 06/23/2012 15:21   Final   Colony Count 35,000 COLONIES/ML   Final   Culture YEAST   Final   Report Status 06/24/2012 FINAL   Final  MRSA PCR SCREENING     Status: None   Collection Time    06/24/12  9:58 AM      Result Value Range Status   MRSA by PCR NEGATIVE  NEGATIVE Final   Comment:  The GeneXpert MRSA Assay (FDA     approved for NASAL specimens     only), is one component of a     comprehensive MRSA colonization     surveillance program. It is not     intended to diagnose MRSA     infection nor to guide or     monitor treatment for     MRSA infections.  CULTURE, ROUTINE-ABSCESS     Status: None   Collection Time    06/27/12 12:51 PM      Result Value Range Status   Specimen Description ABSCESS PELVIC   Final   Special Requests NONE   Final   Gram Stain     Final   Value: MODERATE WBC PRESENT, PREDOMINANTLY PMN     NO SQUAMOUS EPITHELIAL CELLS SEEN     NO ORGANISMS SEEN   Culture NO GROWTH 3 DAYS   Final   Report Status 2020/01/712 FINAL   Final  CLOSTRIDIUM DIFFICILE BY PCR     Status: Abnormal   Collection Time    07/06/12 10:05 AM      Result Value Range Status   C difficile by pcr POSITIVE (*) NEGATIVE Final   Comment: CRITICAL RESULT CALLED TO, READ BACK BY AND  VERIFIED WITH:     E.HARLESS,RN 1324 07/06/12 EHOWARD    Studies/Results: Dg Abd 1 View  07/13/2012   *RADIOLOGY REPORT*  Clinical Data: Rectal tube placement.  ABDOMEN - 1 VIEW  Comparison: Abdominal radiographs 07/11/2012.  Findings: A rectal tube is now in place.  There is some decompression of the bowel.  Bilateral ureteral stents are in place.  The left nephrostomy tube is noted.  IMPRESSION:  1.  Interval placement of a rectal tube with partial decompression of the bowel.   Original Report Authenticated By: Marin Roberts, M.D.    Assessment/Plan:   Improvement with rectal tube decompression. Question will be how long this will be successful given the compression of the colon from the hematoma. Attempt is going to be made to increase his diet and hopefully get off the TPA. No major events plan for this weekend.   LOS: 21 days   Kelyn Ponciano S 07/13/2012, 8:27 AM

## 2012-07-13 NOTE — Progress Notes (Signed)
[facility] PROGRESS NOTE  BREAKER SPRINGER ZOX:[id number] DOB: [birth year] DOA: 06/22/2012 PCP: [name], MD    Assessment/Plan:  IIleus/ Hematoma causing rectal compression -Worsening abdominal distension 7-11. Dr Christella Hartigan perform Flex sigmoidoscopy 7-11 for rectal tube placement and decompression. Hematoma felt to be compressing rectum. -NGT discontinue 7-9. Started on clear diet.  -continue reglan -will continue TNA for nutrition bridging -Continue electrolytes repletion (Goal is K as close to 4 as possible) -continue tx for C. diff  C. Diff colitis -due to ileus and inability to tolerate PO, qualify in complicated case of C. Diff by protocol. -will continue tx with  IV flagyl, vancomycin day 8.   Pelvic hematoma:  Hematoma stable by CT scan.  Monitor hb on heparin.  -Repeated CT abd to check for stability on 7/8 demonstrated no increase in size and no significant compromise with intestine. -Repeat CT 7-8 stable pelvic hematoma.  -Probably causing rectal compression. S/P sigmoidoscopy.   Anemia  Received a total of 4 units PRBC during this admission. Hb at 12. Repeat in am.   Hypokalemia  -Replete with 4 runs IV and 40 meq Po. Repeat B-met this afternoon.   Hypertension  Blood pressure stable. Will continue metoprolol IV  Hyperlipidemia  Continue holding statin for now and will resume when tolerating PO   hemorrhoids  -General surgery consulted and recommended sitz bath and ice packs for now.  -continue anusol rectal creams.   Fever with mild leucocytosis: Resolved.  Pelvic fluid collection Aspiration negative for bacteria.  Completed treatment with IV vancomycin for MRSA bacteremia. (Vancomycin stopped 6-27).  Urine cx growing yeast, fluconazole stopped after 3 days tx.  -now receiving treatment for c. Diff. WBC 8.5  and no fever.  Hx of DVT and bilateral PE :  -repeated CT scan hematoma stable. HB stable at 11. Patient was started on prophylaxis dose  heparin.  -Recent Doppler of his lower extremity was negative for DVT.  -Repeated a CT angiogram of the chest which was negative for PE.  -Patient received 3 Month treatment for PE.  -Appreciate Dr Cyndie Chime recommendation. No indication for IVC filter.  -Will continue heparin for DVT prophylaxis.  Bladder cancer: S/P surgery.  Treatment per primary team.  Loopogram negative for leak.   Procedure:  S/P endoscopy 7-1 show: The mucosa of the esophagus appeared normal; likely esophageal Spasm. Fluid/food residue in the gastric fundus, gastric body, and gastric antrum consistent with gastroparesis (felt, at least in part, to explain nausea)  Sigmoidoscopy: The pelvic hematoma is causing stricture in proximal rectum, distal  sigmoid. The mucosa at the site of narrowing is severely inflammed, friable appearing. A 32 Fr red rubber Rectal Tube was  placed across the stricture site and placed to gravity drainage   Antibiotics:  Vancomycin and Flagyl 7-5.   Code Status: Full Family Communication: wife and sister at bedside Disposition Plan: to be determine.   Antibiotics:  Vancomycin enemas 07/06/12  IV flagyl 07/06/12  HPI/Subjective: Patient awake, denies abdominal pain. He has flat affect. Wife at bedside.   Objective: Filed Vitals:   07/12/12 1112 07/12/12 1350 07/12/12 2014 07/13/12 0457  BP: 125/71 125/81 126/89 129/82  Pulse: 82 72 82 77  Temp: 97.5 F (36.4 C) 97.2 F (36.2 C) 97.7 F (36.5 C) 98 F (36.7 C)  TempSrc: Oral Oral Oral Oral  Resp: 18 20 16 20   Height:      Weight:      SpO2: 100% 99% 98% 98%    Intake/Output  Summary (Last 24 hours) at 07/13/12 1018 Last data filed at 07/13/12 0458  Gross per 24 hour  Intake    790 ml  Output   6925 ml  Net  -6135 ml   Filed Weights   06/23/12 0405 06/23/12 0449  Weight: 103.2 kg (227 lb 8.2 oz) 100.109 kg (220 lb 11.2 oz)    Exam:   General:  Afebrile, no distress.   Cardiovascular: S1 and S2, no rubs or  gallops  Respiratory: CTA bilaterally  Abdomen: softer, less distended, urostomy bag in place, mid surgical wound w/o signs or infection; less tender to palpation; decreased  BS.  Musculoskeletal: trace edema bilaterally  Data Reviewed: Basic Metabolic Panel:  Recent Labs Lab 07/08/12 0443 07/09/12 0425 07/10/12 0630 07/11/12 0410 07/12/12 1125 07/13/12 0455  NA 135 134* 133* 139 135 137  K 4.1 4.0 4.1 3.9 3.8 3.1*  CL 98 101 100 104 103 106  CO2 29 27 27 27 26 23   GLUCOSE 149* 147* 127* 135* 135* 133*  BUN 18 17 16 15 15 15   CREATININE 0.88 0.86 0.89 0.83 0.88 0.98  CALCIUM 9.4 8.9 8.9 8.8 8.8 8.5  MG 2.2  --   --  2.2  --   --   PHOS 4.0  --   --  3.9  --   --    CBC:  Recent Labs Lab 07/08/12 0443 07/11/12 0410 07/13/12 0455  WBC 11.2* 8.5 7.8  NEUTROABS 8.4*  --   --   HGB 13.6 13.3 12.9*  HCT 41.9 41.5 40.7  MCV 93.3 94.5 94.4  PLT 374 356 283   CBG:  Recent Labs Lab 07/12/12 1554 07/12/12 2012 07/12/12 2342 07/13/12 0452 07/13/12 0818  GLUCAP 136* 127* 121* 130* 148*    Recent Results (from the past 240 hour(s))  CLOSTRIDIUM DIFFICILE BY PCR     Status: Abnormal   Collection Time    07/06/12 10:05 AM      Result Value Range Status   C difficile by pcr POSITIVE (*) NEGATIVE Final   Comment: CRITICAL RESULT CALLED TO, READ BACK BY AND VERIFIED WITH:     E.HARLESS,RN 1324 07/06/12 EHOWARD     Studies: Dg Abd 1 View  07/13/2012   *RADIOLOGY REPORT*  Clinical Data: Rectal tube placement.  ABDOMEN - 1 VIEW  Comparison: Abdominal radiographs 07/11/2012.  Findings: A rectal tube is now in place.  There is some decompression of the bowel.  Bilateral ureteral stents are in place.  The left nephrostomy tube is noted.  IMPRESSION:  1.  Interval placement of a rectal tube with partial decompression of the bowel.   Original Report Authenticated By: Marin Roberts, M.D.    Scheduled Meds: . famotidine (PEPCID) IV  20 mg Intravenous Q24H  . feeding  supplement  237 mL Oral BID BM  . heparin subcutaneous  5,000 Units Subcutaneous Q8H  . hydrocortisone   Rectal TID  . insulin aspart  0-15 Units Subcutaneous Q4H  . metoCLOPramide (REGLAN) injection  10 mg Intravenous TID AC & HS  . metoprolol  5 mg Intravenous Q6H  . metronidazole  500 mg Intravenous Q8H  . ondansetron  4 mg Intravenous Q4H  . potassium chloride  10 mEq Intravenous Q1 Hr x 4  . vancomycin  250 mg Oral Q6H   Continuous Infusions: . 0.9 % NaCl with KCl 20 mEq / L 1,000 mL (07/12/12 2034)  . Marland KitchenTPN (CLINIMIX-E) Adult 80 mL/hr at 07/12/12 1720   And  .  fat emulsion 240 mL (07/12/12 1720)  . Marland KitchenTPN (CLINIMIX-E) Adult     And  . fat emulsion      Principal Problem:   Pelvic hematoma, male Active Problems:   Bladder cancer   Acute pulmonary embolism (3/14)   DVT (deep venous thrombosis) 3/14   Fever   Bacteremia MRSA 5/14   VTE (venous thromboembolism)   Protein-calorie malnutrition, severe   Acute blood loss anemia   Gastroparesis   Colonic obstruction    Time spent: < 25 minutes   REGALADO,BELKYS  [facility] Pager [phone]. If 7PM-7AM, please contact night-coverage at [email or web address], password [facility] 07/13/2012, 10:18 AM  LOS: 21 days

## 2012-07-13 NOTE — Progress Notes (Signed)
PT Cancellation Note  Patient Details Name: Chad Olson MRN: [id number] DOB: 1945/08/18   Cancelled Treatment:    Reason Eval/Treat Not Completed: Fatigue/lethargy limiting ability to participate Pt asleep most of the day.  Spoke with wife who is concerned about how we get pt up without dislodging the rectal tube.  MD: please advise if tube is taped securely or if we need to add another layer of some type to keep tube attached to leg(?tape it down with thin film to be skin friendly??) Thank you!   Ebony Hail [facility] 07/13/2012, 4:16 PM

## 2012-07-13 NOTE — Progress Notes (Signed)
PARENTERAL NUTRITION CONSULT NOTE - FOLLOW UP  Pharmacy Consult for TNA Indication: Gastroparesis/ileus  No Known Allergies  Patient Measurements: Height: 5\' 11"  (180.3 cm) Weight: 220 lb 11.2 oz (100.109 kg) IBW/kg (Calculated) : 75.3  Vital Signs: Temp: 98 F (36.7 C) (07/12 0457) Temp src: Oral (07/12 0457) BP: 129/82 mmHg (07/12 0457) Pulse Rate: 77 (07/12 0457) Intake/Output from previous day: 07/11 0701 - 07/12 0700 In: 790 [P.O.:790] Out: 6925 [Urine:3000; Stool:3925] Intake/Output from this shift:    Labs:  Recent Labs  07/11/12 0410 07/13/12 0455  WBC 8.5 7.8  HGB 13.3 12.9*  HCT 41.5 40.7  PLT 356 283     Recent Labs  07/11/12 0410 07/12/12 1125 07/13/12 0455  NA 139 135 137  K 3.9 3.8 3.1*  CL 104 103 106  CO2 27 26 23   GLUCOSE 135* 135* 133*  BUN 15 15 15   CREATININE 0.83 0.88 0.98  CALCIUM 8.8 8.8 8.5  MG 2.2  --   --   PHOS 3.9  --   --   PROT 6.2  --   --   ALBUMIN 2.7*  --   --   AST 15  --   --   ALT 15  --   --   ALKPHOS 63  --   --   BILITOT 0.3  --   --    Estimated Creatinine Clearance: 89.4 ml/min (by C-G formula based on Cr of 0.98).    Recent Labs  07/12/12 2342 07/13/12 0452 07/13/12 0818  GLUCAP 121* 130* 148*    Assessment:  67 YO M admitted 6/22 with abdominal pain s/p radical cystoprostatectomy for bladder cancer in Feb,2014. On 06/15/12 he required ureteral reimplantation into lleal conduit. He has been unable to tolerate oral diet with persistent N/V, EGD done 7/1 revealed likely gastroparesis, abd films 7/2 consistent with ileus. NGT placed 7/2 with immediate brown-green drainage. Due to poor nutritional intake and ileus, TPN ordered to start 7/2 PM.  Patient found with C.diff infection 07/06/12. Patient with prolonged ileus, large pelvic hematoma compressing the rectum s/p flex sig 7/11 with rectal tube placement for decompression.   Today, 7/12, is TNA day# 11 via R double lumen PICC. Rectal tube with large  output. Abd distention and discomfort improved. Diet advanced today to full liquid with hope to taper off TNA soon. Ensure Complete BID also ordered for today.  Nutritional Goals:  - RD recs 7/9: 1950-2100 Kcal, 95-120g protein, 1.9-2.1 L fluid/day - Clinimix E 5/15 at a goal rate of 23ml/hr + 20% fat emulsion at 58ml/hr to provide: 96g/day protein, 1843Kcal/day.  Current nutrition:  - Diet: Full liquid started 7/12 + Ensure BID (ordered 7/12) - TNA: Clinimix E 5/15 @ goal rate of 80 ml/hr - mIVF:  NS + 20 mEq/L KCl @ 50 ml/hr  CBGs & Insulin requirements past 24 hours:  - CBGs < 150, required 13 units of SSI   Labs: Electrolytes: K+ 3.1, MD ordered KCl 40 mEq x 1, mag + phos ok on 7/10 Renal Function: Scr wnl/stable. Good UOP Hepatic Function: wnl Pre-Albumin: 13.4 (7/3) Triglycerides: wnl 161 (7/3), 110 (7/7) CBGs: controlled    Plan:    Continue Clinimix E 5/15 at goal rate of 80 ml/hr  TNA to contain IV fat emulsion 20% at 10 ml/hr daily  Standard multivitamins and trace elements daily  F/u tolerability to diet advancement and supplements  TNA labs Monday/Thursdays  Pharmacy will follow up daily   Geoffry Paradise, PharmD,  BCPS Pager: [phone] 9:02 AM Pharmacy #: [id number]

## 2012-07-14 ENCOUNTER — Inpatient Hospital Stay (HOSPITAL_COMMUNITY): Payer: BC Managed Care – PPO

## 2012-07-14 LAB — GLUCOSE, CAPILLARY
Glucose-Capillary: 120 mg/dL — ABNORMAL HIGH (ref 70–99)
Glucose-Capillary: 161 mg/dL — ABNORMAL HIGH (ref 70–99)
Glucose-Capillary: 91 mg/dL (ref 70–99)

## 2012-07-14 LAB — BASIC METABOLIC PANEL
CO2: 22 mEq/L (ref 19–32)
Calcium: 8.6 mg/dL (ref 8.4–10.5)
Creatinine, Ser: 0.87 mg/dL (ref 0.50–1.35)
Glucose, Bld: 129 mg/dL — ABNORMAL HIGH (ref 70–99)

## 2012-07-14 MED ORDER — FAT EMULSION 20 % IV EMUL: 240.0000 mL | INTRAVENOUS | Status: AC

## 2012-07-14 MED ORDER — POTASSIUM CHLORIDE CRYS ER 20 MEQ PO TBCR
40.0000 meq | EXTENDED_RELEASE_TABLET | Freq: Three times a day (TID) | ORAL | Status: DC
  Administered 2012-07-14 – 2012-08-09 (×73): 40 meq via ORAL
  Filled 2012-07-14 (×82): qty 2

## 2012-07-14 MED ORDER — TRACE MINERALS CR-CU-F-FE-I-MN-MO-SE-ZN IV SOLN: INTRAVENOUS | Status: AC

## 2012-07-14 NOTE — Progress Notes (Signed)
Patient ID: Chad Olson, male   DOB: 09-01-1945, 67 y.o.   MRN: [id number] 2 Days Post-Op Subjective: Patient reports no major changes overnight. Rectal tube continues to decompress his colon well with excellent output. Is tolerated his liquid diet well.  Objective: Vital signs in last 24 hours: Temp:  [97.6 F (36.4 C)-98.5 F (36.9 C)] 97.6 F (36.4 C) (07/13 0533) Pulse Rate:  [74-82] 74 (07/13 0533) Resp:  [18-20] 20 (07/13 0533) BP: (124-131)/(70-79) 131/79 mmHg (07/13 0533) SpO2:  [98 %-99 %] 98 % (07/13 0533)  Intake/Output from previous day: 07/12 0701 - 07/13 0700 In: 3389.3 [P.O.:1470; I.V.:565.8; IV Piggyback:200; TPN:1153.5] Out: 6625 [Urine:3850; Stool:2775] Intake/Output this shift:    Physical Exam:  Constitutional: Vital signs reviewed. WD WN in NAD  .   Cardiovascular: RRr Pulmonary/Chest: Normal effort Abdominal: Soft.  Extremities: No cyanosis or edema   Lab Results:  Recent Labs  07/13/12 0455  HGB 12.9*  HCT 40.7   BMET  Recent Labs  07/13/12 0455 07/13/12 1619  NA 137 138  K 3.1* 3.6  CL 106 108  CO2 23 22  GLUCOSE 133* 129*  BUN 15 14  CREATININE 0.98 0.89  CALCIUM 8.5 8.4   No results found for this basename: LABPT, INR,  in the last 72 hours No results found for this basename: LABURIN,  in the last 72 hours Results for orders placed during the hospital encounter of 06/22/12  CULTURE, BLOOD (ROUTINE X 2)     Status: None   Collection Time    06/22/12 10:43 PM      Result Value Range Status   Specimen Description BLOOD LEFT ANTECUBITAL   Final   Special Requests BOTTLES DRAWN AEROBIC AND ANAEROBIC 5CC   Final   Culture  Setup Time 06/23/2012 17:47   Final   Culture NO GROWTH 5 DAYS   Final   Report Status 06/29/2012 FINAL   Final  CULTURE, BLOOD (ROUTINE X 2)     Status: None   Collection Time    06/22/12 10:57 PM      Result Value Range Status   Specimen Description BLOOD LEFT ARM   Final   Special Requests BOTTLES  DRAWN AEROBIC AND ANAEROBIC 10CC   Final   Culture  Setup Time 06/23/2012 17:47   Final   Culture NO GROWTH 5 DAYS   Final   Report Status 06/29/2012 FINAL   Final  URINE CULTURE     Status: None   Collection Time    06/23/12  2:17 AM      Result Value Range Status   Specimen Description URINE, CATHETERIZED   Final   Special Requests Normal   Final   Culture  Setup Time 06/23/2012 15:21   Final   Colony Count 35,000 COLONIES/ML   Final   Culture YEAST   Final   Report Status 06/24/2012 FINAL   Final  MRSA PCR SCREENING     Status: None   Collection Time    06/24/12  9:58 AM      Result Value Range Status   MRSA by PCR NEGATIVE  NEGATIVE Final   Comment:            The GeneXpert MRSA Assay (FDA     approved for NASAL specimens     only), is one component of a     comprehensive MRSA colonization     surveillance program. It is not     intended to diagnose MRSA  infection nor to guide or     monitor treatment for     MRSA infections.  CULTURE, ROUTINE-ABSCESS     Status: None   Collection Time    06/27/12 12:51 PM      Result Value Range Status   Specimen Description ABSCESS PELVIC   Final   Special Requests NONE   Final   Gram Stain     Final   Value: MODERATE WBC PRESENT, PREDOMINANTLY PMN     NO SQUAMOUS EPITHELIAL CELLS SEEN     NO ORGANISMS SEEN   Culture NO GROWTH 3 DAYS   Final   Report Status 2020/06/2712 FINAL   Final  CLOSTRIDIUM DIFFICILE BY PCR     Status: Abnormal   Collection Time    07/06/12 10:05 AM      Result Value Range Status   C difficile by pcr POSITIVE (*) NEGATIVE Final   Comment: CRITICAL RESULT CALLED TO, READ BACK BY AND VERIFIED WITH:     E.HARLESS,RN 1324 07/06/12 EHOWARD    Studies/Results: Dg Abd 1 View  07/14/2012   *RADIOLOGY REPORT*  Clinical Data: Follow-up postop ileus.  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  ABDOMEN - 1 VIEW  Comparison: 07/13/2012  Findings: Rectal tube remains in place.  Decreased gaseous  distention of the colon is seen, consistent with improving ileus. Bilateral internal - external ureteral stents remain in place, as well as left percutaneous nephrostomy.  The skin staples noted.  IMPRESSION: Improving postop colonic ileus.   Original Report Authenticated By: Myles Rosenthal, M.D.   Dg Abd 1 View  07/13/2012   *RADIOLOGY REPORT*  Clinical Data: Rectal tube placement.  ABDOMEN - 1 VIEW  Comparison: Abdominal radiographs 07/11/2012.  Findings: A rectal tube is now in place.  There is some decompression of the bowel.  Bilateral ureteral stents are in place.  The left nephrostomy tube is noted.  IMPRESSION:  1.  Interval placement of a rectal tube with partial decompression of the bowel.   Original Report Authenticated By: Marin Roberts, M.D.    Assessment/Plan:   No significant change and plan. Current management mostly by GI services. Rectal tube is worked well and decompressed the colon but ongoing management over the next several weeks with increased ambulation may be difficult. Patient is tolerating liquid diarrhea slowly well and plans are to discontinue his hyperalimentation.   LOS: 22 days   Regina Ganci S 07/14/2012, 8:47 AM

## 2012-07-14 NOTE — Progress Notes (Signed)
[facility] PROGRESS NOTE  LINDON KIEL AVW:[id number] DOB: 1945/03/18 DOA: 06/22/2012 PCP: [name], MD    Assessment/Plan:  IIleus/ Hematoma causing rectal compression -Worsening abdominal distension 7-11. Dr Christella Hartigan perform Flex sigmoidoscopy 7-11 for rectal tube placement and decompression. Hematoma felt to be compressing rectum. -NGT discontinue 7-9. Started on clear diet.  -continue reglan -TNA DC by gastro 7-13. Started on ensure.  -Continue electrolytes repletion (Goal is K as close to 4 as possible) -continue tx for C. diff  C. Diff colitis -will continue vancomycin day 9. Received 8 days of flagyl.   Pelvic hematoma:  Hematoma stable by CT scan.  Monitor hb on heparin.  -Repeated CT abd to check for stability on 7/8 demonstrated no increase in size and no significant compromise with intestine. -Repeat CT 7-8 stable pelvic hematoma.  -Probably causing rectal compression. S/P sigmoidoscopy.   Anemia  Received a total of 4 units PRBC during this admission. Hb at 12. Repeat in am.   Hypokalemia  KCl 40 meq po TID.   Hypertension  Blood pressure stable. Will continue metoprolol IV  Hyperlipidemia  Continue holding statin for now and will resume when tolerating PO   hemorrhoids  -General surgery consulted and recommended sitz bath and ice packs for now.  -continue anusol rectal creams.   Fever with mild leucocytosis: Resolved.  Pelvic fluid collection Aspiration negative for bacteria.  Completed treatment with IV vancomycin for MRSA bacteremia. (Vancomycin stopped 6-27).  Urine cx growing yeast, fluconazole stopped after 3 days tx.  -now receiving treatment for c. Diff. WBC 8.5  and no fever.  Hx of DVT and bilateral PE :  -repeated CT scan hematoma stable. HB stable at 11. Patient was started on prophylaxis dose heparin.  -Recent Doppler of his lower extremity was negative for DVT.  -Repeated a CT angiogram of the chest which was negative for PE.   -Patient received 3 Month treatment for PE.  -Appreciate Dr Cyndie Chime recommendation. No indication for IVC filter.  -Will continue heparin for DVT prophylaxis.  Bladder cancer: S/P surgery.  Treatment per primary team.  Loopogram negative for leak.   Procedure:  S/P endoscopy 7-1 show: The mucosa of the esophagus appeared normal; likely esophageal Spasm. Fluid/food residue in the gastric fundus, gastric body, and gastric antrum consistent with gastroparesis (felt, at least in part, to explain nausea)  Sigmoidoscopy: The pelvic hematoma is causing stricture in proximal rectum, distal  sigmoid. The mucosa at the site of narrowing is severely inflammed, friable appearing. A 32 Fr red rubber Rectal Tube was  placed across the stricture site and placed to gravity drainage   Antibiotics:  Vancomycin and Flagyl 7-5.   Code Status: Full Family Communication: wife at bedside Disposition Plan: to be determine.   Antibiotics:  Vancomycin enemas 07/06/12  IV flagyl 07/06/12  HPI/Subjective: Patient sleeping, wife at bedside.  Wife feels hopeless and she is very concern with Mr Hidrogo medical situation. She said he hasn't had quality of live since January.  She is wondering what is going to happen in 2 weeks, because likely rectal tube wont be able to be in place for more than 2 weeks. She discussed options with Dr Christella Hartigan, surgery or rectal tube. She would like to know more about surgery and risk. Will need to discussed with Primary team, and GI regarding surgery consultation. We also might need goals of care meeting.    Objective: Filed Vitals:   07/13/12 0457 07/13/12 1403 07/13/12 2114 07/14/12 0533  BP:  129/82 124/71 126/70 131/79  Pulse: 77 75 82 74  Temp: 98 F (36.7 C) 98.5 F (36.9 C) 98.2 F (36.8 C) 97.6 F (36.4 C)  TempSrc: Oral Oral Oral Oral  Resp: 20 18 20 20   Height:      Weight:      SpO2: 98% 99% 98% 98%    Intake/Output Summary (Last 24 hours) at 07/14/12  1331 Last data filed at 07/14/12 1248  Gross per 24 hour  Intake 3029.33 ml  Output   6425 ml  Net -3395.67 ml   Filed Weights   06/23/12 0405 06/23/12 0449  Weight: 103.2 kg (227 lb 8.2 oz) 100.109 kg (220 lb 11.2 oz)    Exam:   General:  no distress.   Cardiovascular: S1 and S2, no rubs or gallops  Respiratory: CTA bilaterally  Abdomen: softer, less distended, urostomy bag in place, mid surgical wound w/o signs or infection;  decreased  BS.  Musculoskeletal: trace edema bilaterally  Data Reviewed: Basic Metabolic Panel:  Recent Labs Lab 07/08/12 0443  07/11/12 0410 07/12/12 1125 07/13/12 0455 07/13/12 1619 07/14/12 0828  NA 135  < > 139 135 137 138 139  K 4.1  < > 3.9 3.8 3.1* 3.6 3.4*  CL 98  < > 104 103 106 108 108  CO2 29  < > 27 26 23 22 22   GLUCOSE 149*  < > 135* 135* 133* 129* 129*  BUN 18  < > 15 15 15 14 12   CREATININE 0.88  < > 0.83 0.88 0.98 0.89 0.87  CALCIUM 9.4  < > 8.8 8.8 8.5 8.4 8.6  MG 2.2  --  2.2  --   --   --   --   PHOS 4.0  --  3.9  --   --   --   --   < > = values in this interval not displayed. CBC:  Recent Labs Lab 07/08/12 0443 07/11/12 0410 07/13/12 0455  WBC 11.2* 8.5 7.8  NEUTROABS 8.4*  --   --   HGB 13.6 13.3 12.9*  HCT 41.9 41.5 40.7  MCV 93.3 94.5 94.4  PLT 374 356 283   CBG:  Recent Labs Lab 07/13/12 2017 07/14/12 0041 07/14/12 0404 07/14/12 0748 07/14/12 1158  GLUCAP 122* 120* 139* 135* 114*    Recent Results (from the past 240 hour(s))  CLOSTRIDIUM DIFFICILE BY PCR     Status: Abnormal   Collection Time    07/06/12 10:05 AM      Result Value Range Status   C difficile by pcr POSITIVE (*) NEGATIVE Final   Comment: CRITICAL RESULT CALLED TO, READ BACK BY AND VERIFIED WITH:     E.HARLESS,RN 1324 07/06/12 EHOWARD     Studies: Dg Abd 1 View  07/14/2012   *RADIOLOGY REPORT*  Clinical Data: Follow-up postop ileus.  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  ABDOMEN - 1 VIEW   Comparison: 07/13/2012  Findings: Rectal tube remains in place.  Decreased gaseous distention of the colon is seen, consistent with improving ileus. Bilateral internal - external ureteral stents remain in place, as well as left percutaneous nephrostomy.  The skin staples noted.  IMPRESSION: Improving postop colonic ileus.   Original Report Authenticated By: Myles Rosenthal, M.D.   Dg Abd 1 View  07/13/2012   *RADIOLOGY REPORT*  Clinical Data: Rectal tube placement.  ABDOMEN - 1 VIEW  Comparison: Abdominal radiographs 07/11/2012.  Findings: A rectal tube is now in place.  There is some decompression of the bowel.  Bilateral ureteral stents are in place.  The left nephrostomy tube is noted.  IMPRESSION:  1.  Interval placement of a rectal tube with partial decompression of the bowel.   Original Report Authenticated By: Marin Roberts, M.D.    Scheduled Meds: . famotidine (PEPCID) IV  20 mg Intravenous Q24H  . feeding supplement  237 mL Oral BID BM  . heparin subcutaneous  5,000 Units Subcutaneous Q8H  . hydrocortisone   Rectal TID  . insulin aspart  0-15 Units Subcutaneous Q4H  . metoCLOPramide (REGLAN) injection  10 mg Intravenous TID AC & HS  . metoprolol  5 mg Intravenous Q6H  . ondansetron  4 mg Intravenous Q4H  . potassium chloride  40 mEq Oral TID  . vancomycin  250 mg Oral Q6H   Continuous Infusions: . 0.9 % NaCl with KCl 20 mEq / L 50 mL/hr at 07/14/12 0123  . Marland KitchenTPN (CLINIMIX-E) Adult 40 mL/hr at 07/14/12 0800   And  . fat emulsion 240 mL (07/14/12 0800)    Principal Problem:   Pelvic hematoma, male Active Problems:   Bladder cancer   Acute pulmonary embolism (3/14)   DVT (deep venous thrombosis) 3/14   Fever   Bacteremia MRSA 5/14   VTE (venous thromboembolism)   Protein-calorie malnutrition, severe   Acute blood loss anemia   Gastroparesis   Colonic obstruction    Time spent: < 25 minutes   Shiann Kam  [facility] Pager [phone]. If 7PM-7AM, please  contact night-coverage at [email or web address], password [facility] 07/14/2012, 1:31 PM  LOS: 22 days

## 2012-07-14 NOTE — Progress Notes (Addendum)
TNA Protocol - Brief Note  67 yom on D#12 TNA for gastroparesis/ileus. Diet advanced to full liquid 7/12 and patient is tolerating well along with Ensure Complete.  Today, surgery order to stop TNA after the current bag.   Plan: Wean TNA to 40 ml/hr and run until 6PM tonight then turn to OFF. Continue IV fat emulsion at 10 ml/hr then turn to OFF at 6PM.  D/C all TNA related labs. RN notified. Pharmacy will sign off.   Geoffry Paradise, PharmD, BCPS Pager: [phone] 7:40 AM Pharmacy #: [phone]

## 2012-07-14 NOTE — Progress Notes (Signed)
[facility] Progress Note    Since last GI note: Tolerating full liquids.  NO nausea, vomiting.  Abd much flatter, less distended.  In good spirits  Objective: Vital signs in last 24 hours: Temp:  [97.6 F (36.4 C)-98.5 F (36.9 C)] 97.6 F (36.4 C) (07/13 0533) Pulse Rate:  [74-82] 74 (07/13 0533) Resp:  [18-20] 20 (07/13 0533) BP: (124-131)/(70-79) 131/79 mmHg (07/13 0533) SpO2:  [98 %-99 %] 98 % (07/13 0533) Last BM Date: 07/13/12 General: alert and oriented times 3 Heart: regular rate and rythm Abdomen: soft, non-tender, non-distended, normal bowel sounds  Rectal tube output in past 24 hours 2075cc  Lab Results:  Recent Labs  07/13/12 0455  WBC 7.8  HGB 12.9*  PLT 283  MCV 94.4    Recent Labs  07/12/12 1125 07/13/12 0455 07/13/12 1619  NA 135 137 138  K 3.8 3.1* 3.6  CL 103 106 108  CO2 26 23 22   GLUCOSE 135* 133* 129*  BUN 15 15 14   CREATININE 0.88 0.98 0.89  CALCIUM 8.8 8.5 8.4   Medications: Scheduled Meds: . famotidine (PEPCID) IV  20 mg Intravenous Q24H  . feeding supplement  237 mL Oral BID BM  . heparin subcutaneous  5,000 Units Subcutaneous Q8H  . hydrocortisone   Rectal TID  . insulin aspart  0-15 Units Subcutaneous Q4H  . metoCLOPramide (REGLAN) injection  10 mg Intravenous TID AC & HS  . metoprolol  5 mg Intravenous Q6H  . metronidazole  500 mg Intravenous Q8H  . ondansetron  4 mg Intravenous Q4H  . vancomycin  250 mg Oral Q6H   Continuous Infusions: . 0.9 % NaCl with KCl 20 mEq / L 50 mL/hr at 07/14/12 0123  . Marland KitchenTPN (CLINIMIX-E) Adult 80 mL/hr at 07/13/12 1758   And  . fat emulsion 240 mL (07/13/12 1759)   PRN Meds:.promethazine, sodium chloride, sodium chloride, witch hazel-glycerin    Assessment/Plan: 67 y.o. male with rectal obstruction from 13cm pelvic hematoma, deconditioning  Will stop IV flagyl (was for c.diff). Continue po vanc another 5 days then ok to stop this as well.  No sure that c. Diff was really a  problem for him. I'd like him to ambulate a bit if possible. At least around the room 2-3 times per day, special attention will have to be paid so that he doesn't inadvertently dislodge the rectal tube.  It could take several weeks for the hematoma to resolve enough to decompress the rectum and having him lay in bed that whole time is not an option, so we need to try to get him up and around.  I'm going to d/c the TPN after the current bag. Should not advance past full liquids, ensure bid; solid stool will obstruct the rectal tube.   Rob Bunting, MD  07/14/2012, 7:07 AM [facility] Pager [phone]

## 2012-07-15 ENCOUNTER — Inpatient Hospital Stay (HOSPITAL_COMMUNITY): Payer: BC Managed Care – PPO

## 2012-07-15 ENCOUNTER — Encounter (HOSPITAL_COMMUNITY): Payer: Self-pay | Admitting: Gastroenterology

## 2012-07-15 DIAGNOSIS — C679 Malignant neoplasm of bladder, unspecified: Secondary | ICD-10-CM

## 2012-07-15 DIAGNOSIS — E876 Hypokalemia: Secondary | ICD-10-CM

## 2012-07-15 DIAGNOSIS — K6289 Other specified diseases of anus and rectum: Secondary | ICD-10-CM

## 2012-07-15 LAB — GLUCOSE, CAPILLARY
Glucose-Capillary: 89 mg/dL (ref 70–99)
Glucose-Capillary: 125 mg/dL — ABNORMAL HIGH (ref 70–99)

## 2012-07-15 LAB — CBC
Hemoglobin: 12.9 g/dL — ABNORMAL LOW (ref 13.0–17.0)
MCH: 29.8 pg (ref 26.0–34.0)
Platelets: 279 10*3/uL (ref 150–400)
RBC: 4.33 MIL/uL (ref 4.22–5.81)
WBC: 7.7 10*3/uL (ref 4.0–10.5)

## 2012-07-15 LAB — BASIC METABOLIC PANEL
CO2: 21 mEq/L (ref 19–32)
Calcium: 8.9 mg/dL (ref 8.4–10.5)
Chloride: 110 mEq/L (ref 96–112)
Creatinine, Ser: 1.03 mg/dL (ref 0.50–1.35)
Potassium: 3.1 mEq/L — ABNORMAL LOW (ref 3.5–5.1)
Sodium: 141 mEq/L (ref 135–145)

## 2012-07-15 LAB — PROTIME-INR: INR: 1.09 (ref 0.00–1.49)

## 2012-07-15 LAB — APTT: aPTT: 39 seconds — ABNORMAL HIGH (ref 24–37)

## 2012-07-15 MED ORDER — KCL IN DEXTROSE-NACL 20-5-0.45 MEQ/L-%-% IV SOLN
INTRAVENOUS | Status: DC
  Administered 2012-07-15 – 2012-07-23 (×7): via INTRAVENOUS
  Administered 2012-07-24: 50 mL/h via INTRAVENOUS
  Administered 2012-07-25 – 2012-07-26 (×2): via INTRAVENOUS
  Administered 2012-07-27: 1000 mL via INTRAVENOUS
  Administered 2012-07-29 – 2012-07-30 (×3): via INTRAVENOUS
  Administered 2012-07-30: 100 mL/h via INTRAVENOUS
  Administered 2012-07-31 – 2012-08-04 (×10): via INTRAVENOUS
  Administered 2012-08-04: 100 mL/h via INTRAVENOUS
  Administered 2012-08-05 (×3): via INTRAVENOUS
  Administered 2012-08-06: 100 mL/h via INTRAVENOUS
  Filled 2012-07-15 (×46): qty 1000

## 2012-07-15 MED ORDER — FENTANYL CITRATE 0.05 MG/ML IJ SOLN
INTRAMUSCULAR | Status: AC | PRN
  Administered 2012-07-15: 50 ug via INTRAVENOUS
  Administered 2012-07-15: 100 ug via INTRAVENOUS
  Administered 2012-07-15: 50 ug via INTRAVENOUS

## 2012-07-15 MED ORDER — MIDAZOLAM HCL 2 MG/2ML IJ SOLN
INTRAMUSCULAR | Status: AC | PRN
  Administered 2012-07-15 (×2): 1 mg via INTRAVENOUS

## 2012-07-15 MED ORDER — POTASSIUM CHLORIDE 10 MEQ/100ML IV SOLN
10.0000 meq | INTRAVENOUS | Status: AC
  Administered 2012-07-15 (×2): 10 meq via INTRAVENOUS
  Filled 2012-07-15 (×2): qty 100

## 2012-07-15 MED ORDER — HYDROCODONE-ACETAMINOPHEN 5-325 MG PO TABS
1.0000 | ORAL_TABLET | ORAL | Status: DC | PRN
  Administered 2012-07-15: 2 via ORAL
  Administered 2012-07-15 – 2012-07-16 (×3): 1 via ORAL
  Administered 2012-07-16: 2 via ORAL
  Administered 2012-07-16 – 2012-07-17 (×3): 1 via ORAL
  Administered 2012-07-17 – 2012-07-28 (×4): 2 via ORAL
  Filled 2012-07-15: qty 1
  Filled 2012-07-15: qty 2
  Filled 2012-07-15 (×4): qty 1
  Filled 2012-07-15 (×5): qty 2
  Filled 2012-07-15: qty 1
  Filled 2012-07-15: qty 2
  Filled 2012-07-15: qty 1

## 2012-07-15 MED ORDER — DEXTROSE-NACL 5-0.45 % IV SOLN
INTRAVENOUS | Status: DC
  Administered 2012-07-15: 12:00:00 via INTRAVENOUS
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
## 2012-07-15 NOTE — Procedures (Signed)
Transgluteal pelvic hematoma drain 12 Fr Liquified hematoma aspirated No comp

## 2012-07-15 NOTE — Progress Notes (Signed)
[facility] PROGRESS NOTE  Chad Olson ZOX:[id number] DOB: February 20, 1945 DOA: 06/22/2012 PCP: [name], MD    Assessment/Plan:  IIleus/ Hematoma causing rectal compression -Worsening abdominal distension 7-11. Dr Christella Hartigan perform Flex sigmoidoscopy 7-11 for rectal tube placement and decompression. Hematoma felt to be compressing rectum. -continue reglan -TNA DC by gastro 7-13. Started on ensure.  -Continue electrolytes repletion (Goal is K as close to 4 as possible) -continue tx for C. Diff for 4 more days per GI.  -Surgery consult for evaluation for colostomy.   C. Diff colitis -will continue vancomycin day 10. Received 8 days of flagyl.   Pelvic hematoma:  Hematoma stable by CT scan.  Monitor hb on heparin.  -Repeated CT abd to check for stability on 7/8 demonstrated no increase in size and no significant compromise with intestine. -Repeat CT 7-8 stable pelvic hematoma.  -Probably causing rectal compression. S/P sigmoidoscopy.   Anemia  Received a total of 4 units PRBC during this admission. Hb at 12. Stable.   Hypokalemia  Secondary to GI losses.  KCl 40 meq po TID.  Will order 2 runs IV.  Check Mg level.   Hypertension  Blood pressure stable. Will continue metoprolol IV  Hyperlipidemia  Continue holding statin for now and will resume when tolerating PO   hemorrhoids  -General surgery consulted and recommended sitz bath and ice packs for now.  -continue anusol rectal creams.   Fever with mild leucocytosis: Resolved.  Pelvic fluid collection Aspiration negative for bacteria.  Completed treatment with IV vancomycin for MRSA bacteremia. (Vancomycin stopped 6-27).  Urine cx growing yeast, fluconazole stopped after 3 days tx.  -now receiving treatment for c. Diff.   Hx of DVT and bilateral PE :  -repeated CT scan hematoma stable. HB stable at 11. Patient was started on prophylaxis dose heparin.  -Recent Doppler of his lower extremity was negative for DVT.   -Repeated a CT angiogram of the chest which was negative for PE.  -Patient received 3 Month treatment for PE.  -Appreciate Dr Cyndie Chime recommendation. No indication for IVC filter.  -Will continue heparin for DVT prophylaxis.  Bladder cancer: S/P surgery.  Treatment per primary team.  Loopogram negative for leak.   Procedure:  S/P endoscopy 7-1 show: The mucosa of the esophagus appeared normal; likely esophageal Spasm. Fluid/food residue in the gastric fundus, gastric body, and gastric antrum consistent with gastroparesis (felt, at least in part, to explain nausea)  Sigmoidoscopy: The pelvic hematoma is causing stricture in proximal rectum, distal  sigmoid. The mucosa at the site of narrowing is severely inflammed, friable appearing. A 32 Fr red rubber Rectal Tube was  placed across the stricture site and placed to gravity drainage   Antibiotics:  Vancomycin and Flagyl 7-5.   Code Status: Full Family Communication: wife , sister at bedside.  Disposition Plan: to be determine.   Antibiotics:  Vancomycin enemas 07/06/12  IV flagyl 07/06/12  HPI/Subjective: Feeling ok, no abdominal pain, no nausea. Tolerating clears.  Waiting to speak with surgeon.  He has better spirit today.    Objective: Filed Vitals:   07/13/12 2114 07/14/12 0533 07/14/12 1534 07/14/12 2151  BP: 126/70 131/79 123/73 133/74  Pulse: 82 74 84 86  Temp: 98.2 F (36.8 C) 97.6 F (36.4 C) 97.5 F (36.4 C) 98 F (36.7 C)  TempSrc: Oral Oral Oral Oral  Resp: 20 20 18 18   Height:      Weight:      SpO2: 98% 98% 98% 99%  Intake/Output Summary (Last 24 hours) at 07/15/12 1214 Last data filed at 07/15/12 0900  Gross per 24 hour  Intake   2980 ml  Output   2500 ml  Net    480 ml   Filed Weights   06/23/12 0405 06/23/12 0449  Weight: 103.2 kg (227 lb 8.2 oz) 100.109 kg (220 lb 11.2 oz)    Exam:   General:  no distress.   Cardiovascular: S1 and S2, no rubs or gallops  Respiratory: CTA  bilaterally  Abdomen: softer, less distended, urostomy bag in place, mid surgical wound w/o signs or infection;  BS present.  Musculoskeletal: trace edema bilaterally  Data Reviewed: Basic Metabolic Panel:  Recent Labs Lab 07/11/12 0410 07/12/12 1125 07/13/12 0455 07/13/12 1619 07/14/12 0828 07/15/12 0544  NA 139 135 137 138 139 141  K 3.9 3.8 3.1* 3.6 3.4* 3.1*  CL 104 103 106 108 108 110  CO2 27 26 23 22 22 21   GLUCOSE 135* 135* 133* 129* 129* 109*  BUN 15 15 15 14 12 10   CREATININE 0.83 0.88 0.98 0.89 0.87 1.03  CALCIUM 8.8 8.8 8.5 8.4 8.6 8.9  MG 2.2  --   --   --   --   --   PHOS 3.9  --   --   --   --   --    CBC:  Recent Labs Lab 07/11/12 0410 07/13/12 0455 07/15/12 0544  WBC 8.5 7.8 7.7  HGB 13.3 12.9* 12.9*  HCT 41.5 40.7 41.2  MCV 94.5 94.4 95.2  PLT 356 283 279   CBG:  Recent Labs Lab 07/14/12 1604 07/14/12 2004 07/15/12 07/15/12 0422 07/15/12 0751  GLUCAP 161* 91 87 89 95    Recent Results (from the past 240 hour(s))  CLOSTRIDIUM DIFFICILE BY PCR     Status: Abnormal   Collection Time    07/06/12 10:05 AM      Result Value Range Status   C difficile by pcr POSITIVE (*) NEGATIVE Final   Comment: CRITICAL RESULT CALLED TO, READ BACK BY AND VERIFIED WITH:     E.HARLESS,RN 1324 07/06/12 EHOWARD     Studies: Dg Abd 1 View  07/14/2012   *RADIOLOGY REPORT*  Clinical Data: Follow-up postop ileus.  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  ABDOMEN - 1 VIEW  Comparison: 07/13/2012  Findings: Rectal tube remains in place.  Decreased gaseous distention of the colon is seen, consistent with improving ileus. Bilateral internal - external ureteral stents remain in place, as well as left percutaneous nephrostomy.  The skin staples noted.  IMPRESSION: Improving postop colonic ileus.   Original Report Authenticated By: Myles Rosenthal, M.D.    Scheduled Meds: . famotidine (PEPCID) IV  20 mg Intravenous Q24H  . feeding supplement  237 mL  Oral BID BM  . heparin subcutaneous  5,000 Units Subcutaneous Q8H  . hydrocortisone   Rectal TID  . insulin aspart  0-15 Units Subcutaneous Q4H  . metoCLOPramide (REGLAN) injection  10 mg Intravenous TID AC & HS  . metoprolol  5 mg Intravenous Q6H  . ondansetron  4 mg Intravenous Q4H  . potassium chloride  10 mEq Intravenous Q1 Hr x 2  . potassium chloride  40 mEq Oral TID  . vancomycin  250 mg Oral Q6H   Continuous Infusions: . dextrose 5 % and 0.45% NaCl 50 mL/hr at 07/15/12 1211    Principal Problem:   Pelvic hematoma, male Active Problems:   Bladder cancer s/p  robotic prostatecystectomy [phone]   Acute pulmonary embolism (3/14)   DVT (deep venous thrombosis) 3/14   Fever   Bacteremia MRSA 5/14   VTE (venous thromboembolism)   Protein-calorie malnutrition, severe   Acute blood loss anemia   Gastroparesis   Colonic obstruction    Time spent: < 25 minutes   Ayushi Pla  [facility] Pager [phone]. If 7PM-7AM, please contact night-coverage at [email or web address], password [facility] 07/15/2012, 12:14 PM  LOS: 23 days

## 2012-07-15 NOTE — Progress Notes (Addendum)
See prior [facility] PA note  Long discussion with the patient, his wife, and family.  Discussed with his urologist, Dr. Berneice Heinrich, as well.  Discussed with interventional radiology, Dr. Bonnielee Haff. Not able to reach Gastroenterology (Dr. Christella Hartigan) today.  I also discussed with my partners, Dr. Donell Beers, Johna Sheriff, [place], & Andrey Campanile.  At this point, we would recommend to retry to decompress the pelvic hematoma by VIR trasgluteal approach.  That will resolve all issues.  That is the least risky approach.  It has been three weeks since the last attempt.  More heterogeneity on the CT scan this time & 3 more weeks = more likely to be liquified.  Interventional radiology agrees.  Even partial decompression would be enough to help the rectum from getting severely compressed.  If that is not successful, consider repeat endoscopy/rectal tubes & try in 2-3 weeks.  If not, temporary rectal stent.  Operative transrectal drainage/evacuation with or without the help of the TEM is another possibility.  I would reserve ostomy diversion with transabdominal pelvic hematoma evacuationas last.  That would involve two procedures (ostomy creation & then takedown and significant morbidity.  His urologist wishes to avoid any more operations as well.  Options and questions were discussed and answered.  Patient and his wife and family expressed understanding and appreciation.

## 2012-07-15 NOTE — Progress Notes (Signed)
[facility] Progress Note  Subjective:  Feels much better, and looks much better.  Is actually talking and laughing.  Tolerating full liquid diet.  Objective:  Vital signs in last 24 hours: Temp:  [97.5 F (36.4 C)-98 F (36.7 C)] 98 F (36.7 C) (07/13 2151) Pulse Rate:  [84-86] 86 (07/13 2151) Resp:  [18] 18 (07/13 2151) BP: (123-133)/(73-74) 133/74 mmHg (07/13 2151) SpO2:  [98 %-99 %] 99 % (07/13 2151) Last BM Date: 07/14/12 General:   Alert, well-developed, in NAD Heart:  Regular rate and rhythm; no murmurs Pulm:  CTAB.  No W/R/R. Abdomen:  Soft, non-tender and non-distended. BS present. Extremities:  Without edema. Neurologic:  Alert and  oriented x4;  grossly normal neurologically. Psych:  Alert and cooperative. Normal mood and affect.  Intake/Output from previous day: 07/13 0701 - 07/14 0700 In: 3220 [P.O.:2020; I.V.:1200] Out: 2500 [Urine:2050; Stool:450]  Lab Results:  Recent Labs  07/13/12 0455 07/15/12 0544  WBC 7.8 7.7  HGB 12.9* 12.9*  HCT 40.7 41.2  PLT 283 279   BMET  Recent Labs  07/13/12 1619 07/14/12 0828 07/15/12 0544  NA 138 139 141  K 3.6 3.4* 3.1*  CL 108 108 110  CO2 22 22 21   GLUCOSE 129* 129* 109*  BUN 14 12 10   CREATININE 0.89 0.87 1.03  CALCIUM 8.4 8.6 8.9   Dg Abd 1 View  07/14/2012   *RADIOLOGY REPORT*  Clinical Data: Follow-up postop ileus.  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  ABDOMEN - 1 VIEW  Comparison: 07/13/2012  Findings: Rectal tube remains in place.  Decreased gaseous distention of the colon is seen, consistent with improving ileus. Bilateral internal - external ureteral stents remain in place, as well as left percutaneous nephrostomy.  The skin staples noted.  IMPRESSION: Improving postop colonic ileus.   Original Report Authenticated By: Myles Rosenthal, M.D.    Assessment / Plan: 67 y.o. male with rectal obstruction/ischemia from 13cm pelvic hematoma, deconditioning, Cdiff infection,  bladder cancer s/p several urinary surgeries/complications.   Continue po vanc another 4 days for the Cdiff, then ok to discontinue it.  S/p flexible sigmoidoscopy with 32 Fr red rubber rectal tube placed for decompression, which is working well.  Major concern is that it could take several weeks-months for the hematoma to resolve enough to decompress the rectum (size has not changed in 3 weeks).  Very high risk that the rectal tube could be dislodged with activity or become clogged (even full liquid diet could give him solid stools), and he needs to be ambulating!  We will have surgery see him regarding possible diverting colostomy.  Rectal stent is not an option due to icreased risk of perforation.    LOS: 23 days   ZEHR, JESSICA D.  07/15/2012, 8:15 AM  Pager number [phone]  I have personally taken an interval history, reviewed the chart, and examined the patient.  I agree with the extender's note, impression and recommendations.  I agree that it's worthwhile reattempting percutaneous drainage as the next theraputic measure.  Barbette Hair. Arlyce Dice, MD, [facility] [phone]

## 2012-07-15 NOTE — Progress Notes (Signed)
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 Healed well initially and sent home 6/17. Loopogram 6/27 without internal leak.  2 - Fever / h/o Bacteruria - Pt with MRSA in urine by hospital UCX 03/2012 and again subsequently in blood. Now on Vancomycin daily per ID x 4-6 week total (nearing end of course). Most recent UCX, BCX negative, New set pending from 6/22. Pt readmitted with low-grade fever 6/22 to 100.7 and malaise, no sig leukocytosis. Now on Vanc + Zosyn emirically. UCX with yeast (trated diflucan x 3 days), BCX no growth to date.Pelvic fluid aspirate and no growth / final.  ID now following,and DC'd all ABX given eval except for current C. Diff treatment.  3 - Heme / Recent DVT + PE -incidental PE by CT 03/2012 now on Lovenox, DVT resolved by most recent LE duplex. Repeat CT-PE 6/23 without PE or active bleed from pelvis and Lovenox DC'd. Hgb drift to 7 6/24 and transfused 2 u with response to 8, transfused 2u additional 6/25 with increase to 10. Hematology consult Chad Olson) also agrees no treatment dose anticoagulation at this point, but resume proph dosing since he is not ambulating much.  4 - Pelvic Fluid Collection - Pelvic fluid collection without rim-enhancement noted on CT from ER 6/22. Appears partially simple fluid with some dependent blood by HU analysis, no active extrav of blood or urine by imaging. IR aspiration performed 6/26 and fluid c/w hematoma only (not purulent, not simple/drainable). Repeat aspiration with insertion of 60F pigtail performed 7/14 at which time calculated volume using ellipsoid approximation  .  5 - Hemorrhoids / Lower GI Bleed - Long h/o hemorroids. Pt with worsened / friable bleeding hemorroids on admission that are quite bothersome. No prior specific intervention, likely exacerbated by pelvic fluid collection / hematoma. Gen Surg recs conservative measures and now nearly resolved clinically.  6 - Nutrition / Nausea / C. Difficile- Pt with some element of baseline GI issues with GERD and prior small esophageal polyp. Has had exacerbation of nausea in house and not meeting nutritional goals. GI involved and initial EGD + KUB with ileus / gastroparesis picture. While ileus resolving placed on TPN and NGT for symptom relief and to bridge him with nutrition which was resumed 7/11 following successful decompression with rectal tube place with aide of sigmoidoscopy. C. Diff found positive 7/5 and started on dual therapy with flagyl + vanc enemas with plan for 14 day course.   7 - Disposition - PT eval has recommended DC to SNF when appropriate and family amenable.  PMH sig for obesity and left knee replacement, PE + DVT (now resolved). No CV disease.   Today Chad Olson continues to tollerate PO diet and feeling much less distended with rectal tube in place.   Objective: Vital signs in last 24 hours: Temp:  [97.5 F (36.4 C)-98 F (36.7 C)] 97.5 F (36.4 C) (07/14 1334) Pulse Rate:  [70-86] 79 (07/14 1624) Resp:  [9-18] 11 (07/14 1624) BP: (115-137)/(72-86) 137/86 mmHg (07/14 1624) SpO2:  [97 %-100 %] 97 % (07/14 1624) Last BM Date: 07/15/12  Intake/Output from previous day: 07/13 0701 - 07/14 0700 In: 3220 [P.O.:2020; I.V.:1200] Out: 2500 [Urine:2050; Stool:450] Intake/Output this shift: Total I/O In: 360 [P.O.:360] Out: 1350 [Urine:600;  Stool:750]  General appearance: alert, cooperative and appears stated age Head: Normocephalic, without obvious abnormality, atraumatic Eyes: conjunctivae/corneas clear. PERRL, EOM's intact. Fundi benign. Ears: normal TM's and external  ear canals both ears Nose: Nares normal. Septum midline. Mucosa normal. No drainage or sinus tenderness. Throat: lips, mucosa, and tongue normal; teeth and gums normal Neck: no adenopathy, no carotid bruit, no JVD, supple, symmetrical, trachea midline and thyroid not enlarged, symmetric, no tenderness/mass/nodules Back: symmetric, no curvature. ROM normal. No CVA tenderness. Resp: clear to auscultation bilaterally Chest wall: no tenderness Cardio: regular rate and rhythm, S1, S2 normal, no murmur, click, rub or gallop GI: soft, non-tender; bowel sounds normal; no masses,  no organomegaly Male genitalia: normal Extremities: extremities normal, atraumatic, no cyanosis or edema and RUE PICC c/d/i. Pulses: 2+ and symmetric Skin: Skin color, texture, turgor normal. No rashes or lesions Lymph nodes: Cervical, supraclavicular, and axillary nodes normal. Neurologic: Grossly normal Incision/Wound: Prior low midline incision c/d/i, RLQ urostomy pink / patent with Bander stents in place. Rectal tube in place with copious liquid stool. New transgulteal drain with dark blood in bulb, scant amount.  Lab Results:   Recent Labs  07/13/12 0455 07/15/12 0544  WBC 7.8 7.7  HGB 12.9* 12.9*  HCT 40.7 41.2  PLT 283 279   BMET  Recent Labs  07/14/12 0828 07/15/12 0544  NA 139 141  K 3.4* 3.1*  CL 108 110  CO2 22 21  GLUCOSE 129* 109*  BUN 12 10  CREATININE 0.87 1.03  CALCIUM 8.6 8.9   PT/INR  Recent Labs  07/15/12 1450  LABPROT 13.9  INR 1.09   ABG No results found for this basename: PHART, PCO2, PO2, HCO3,  in the last 72 hours  Studies/Results: Dg Abd 1 View  07/14/2012   *RADIOLOGY REPORT*  Clinical Data: Follow-up postop ileus.  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  ABDOMEN - 1 VIEW  Comparison: 07/13/2012  Findings: Rectal tube remains in place.  Decreased gaseous distention of the colon is seen, consistent with improving ileus. Bilateral internal -  external ureteral stents remain in place, as well as left percutaneous nephrostomy.  The skin staples noted.  IMPRESSION: Improving postop colonic ileus.   Original Report Authenticated By: Myles Rosenthal, M.D.   Ct Guided Abscess Drain  07/15/2012   *RADIOLOGY REPORT*  Clinical Data/Indication: THE COLONIC OBSTRUCTION SECONDARY TO A PELVIC FLUID COLLECTION.  CT GUIDED ABCESS DRAINAGE WITH CATHETER  Sedation: Versed 2.0 mg, Fentanyl 100 mg.  Total Moderate Sedation Time: 15 minutes.  Procedure: The procedure, risks, benefits, and alternatives were explained to the patient. Questions regarding the procedure were encouraged and answered. The patient understands and consents to the procedure.  Time-out procedure was performed.  The gluteal region in the prone position was prepped with betadine in a sterile fashion, and a sterile drape was applied covering the operative field. A surgical mask and sterile gloves were used for the procedure.  Under CT guidance, an 18 gauge needle was inserted into the pelvic fluid collection via left transgluteal approach and removed over an Amplatz.  A 12-French dilator followed by a 12-French drain were inserted.  It was looped in the fluid collection and string fixed and sewn to the skin. Dark bloody fluid was aspirated.  Findings: Imaging demonstrates placement of a left 12-French transgluteal pelvic fluid collection drain.  Complications: None.  IMPRESSION: Successful left transgluteal pelvic drainage placement into a liquefying hematoma.  Culture was sent.   Original Report Authenticated  By: Jolaine Click, M.D.    Anti-infectives: Anti-infectives   Start     Dose/Rate Route Frequency Ordered Stop   07/11/12 1200  vancomycin (VANCOCIN) 50 mg/mL oral solution 250 mg     250 mg Oral 4 times per day 07/11/12 1013     07/06/12 1800  vancomycin (VANCOCIN) 500 mg in sodium chloride irrigation 0.9 % 100 mL ENEMA  Status:  Discontinued     500 mg Rectal 4 times per day 07/06/12 1335  07/11/12 1244   07/06/12 1430  metroNIDAZOLE (FLAGYL) IVPB 500 mg  Status:  Discontinued     500 mg 100 mL/hr over 60 Minutes Intravenous 3 times per day 07/06/12 1335 07/14/12 0725   06/25/12 1000  vancomycin (VANCOCIN) 1,250 mg in sodium chloride 0.9 % 250 mL IVPB  Status:  Discontinued     1,250 mg 166.7 mL/hr over 90 Minutes Intravenous Every 12 hours 06/25/12 0856 06/28/12 1102   06/24/12 2000  fluconazole (DIFLUCAN) IVPB 200 mg     200 mg 100 mL/hr over 60 Minutes Intravenous Every 24 hours 06/24/12 1733 06/26/12 2322   06/23/12 0900  vancomycin (VANCOCIN) 1,250 mg in sodium chloride 0.9 % 250 mL IVPB     1,250 mg 166.7 mL/hr over 90 Minutes Intravenous Every 12 hours 06/23/12 0522 06/24/12 2231   06/23/12 0600  piperacillin-tazobactam (ZOSYN) IVPB 3.375 g  Status:  Discontinued     3.375 g 12.5 mL/hr over 240 Minutes Intravenous 3 times per day 06/23/12 0447 06/28/12 1102   06/23/12 0330  cefTRIAXone (ROCEPHIN) 1 g in dextrose 5 % 50 mL IVPB     1 g 100 mL/hr over 30 Minutes Intravenous  Once 06/23/12 0319 06/23/12 0440      Assessment/Plan:  1 - Bladder Cancer / Cystectomy / Ureteral Revision- NED form cancer perspective.Loopogram without leak this admission. WIll consider staple removal and left neph tube removal under fluoro later this admission when back to baseline in terms of GI status.  2 - Fever / h/o Bacteruria - Now off wound-specific ABX given negative CX from blood, urine, pelvic fluid.  3 - Heme / Recent DVT + PE -Lovenox now stopped. SCD's in place.  . PT following. Remain proph SQ heparin + SCD's. He is now making much more progress in terms of ambulation and PT.  4 - Pelvic Fluid Collection - Imaging and aspirate c/w non-infected hemaotma. Size stable / decreasing as expected. Eplained to pt this will resolve, but slowly (weeks-mos). To help expedite resolution IR drain placed 7/14, optimistic this will be able to aspirate a meaningful amt of hematoma to decrease  colonic compression, Estimate would need to aspirate plus to achieve 50% reduction in volume based on volume estimate.  5 - Hemorrhoids / Lower GI Bleed - Agree with conservative measures for now. Hgb stable.  6 - Nutrition / Nausea / C. Difficile- Remains central active issue. Agree with aggressive treatment for C. Diff and now nearing end of course. Clearly feels better with rectal tube. Further options including rectal tube / low residue diet v. Diverting colostomy with or withotu pelvic hematoma evacuation v. Transcolonic hematoma evaculation discussed. Will keep with current plan of rectal tube now augmented by hematoma drain to help expedite resolution.   7 - Disposition - SNF pending nutritional status and ileus resolution.  Greatly appreciate hospitalist, gen surgery, ID, hematology,PT, GI, TPN pharmacy input.  [facility], Olukemi Panchal 07/15/2012

## 2012-07-15 NOTE — Progress Notes (Signed)
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 evaluate the vascular anatomy.,  Contrast: OMNIPAQUE IOHEXOL 350 MG/ML SOLN  Comparison: None.  Findings: Right upper extremity PICC line tip extends into the SVC. Atherosclerotic changes of the thoracic aorta.  Negative for aneurysm or dissection.  Major branch vessels are patent. Visualized central pulmonary arteries are patent.  No significant proximal or hilar pulmonary embolus by CTA.  Normal heart size.  No pericardial or pleural effusion.  No hiatal hernia.  No abnormal adenopathy within the chest.  Lung windows demonstrate no focal airspace process, acute pneumonia, collapse, consolidation, edema, or interstitial process. Minor dependent basilar atelectasis.  IMPRESSION: Negative for significant acute pulmonary embolus by CTA.  Thoracic atherosclerosis without aneurysm or dissection  Minimal basilar atelectasis  No acute intrathoracic process  CT ANGIOGRAPHY ABDOMEN AND PELVIS  Technique:  Multidetector CT imaging of the abdomen and pelvis was performed using the standard protocol during bolus administration of intravenous contrast.  Multiplanar reconstructed images including MIPs were obtained and reviewed to evaluate the vascular anatomy.  Comparison:  06/23/2012, 06/02/2012  Findings:   Minor atherosclerosis of the aorta and iliac bifurcation.  Negative for aneurysm, dissection, or retroperitoneal hemorrhage.  Celiac, SMA, renal arteries, and IMA all remain patent.  Negative for renal or mesenteric vascular occlusive process.  Common, internal, and external iliac arteries are patent and the pelvis.  The visualized common femoral, proximal superficial femoral and profunda femoral arteries visualized are also patent.  Large heterogeneous pelvic fluid collection again evident. Measuring at a similar level, this pelvic fluid collection roughly measures 15 x 8 cm.  Slight interval enlargement with more mass effect on the rectum.  No evidence of active contrast extravasation or bleeding demonstrated within the  hematoma.  Layering small gallstones noted, image 92.  No biliary dilatation. No focal hepatic abnormality.  Biliary system, pancreas, spleen, and adrenal glands are within normal limits and stable.  Stable right renal cysts.  Bilateral ureteral stents noted extending through the ileal conduit.  External left nephrostomy catheter remains.  No evidence of hydronephrosis or obstruction. Postop changes from an ileal loop conduit.  Negative for bowel obstruction, dilatation, or ileus.  Normal appendix.  Postop changes from midline laparotomy below the umbilicus.  Stable postoperative appearance of the lower abdomen and upper pelvis.  Hemorrhoids evident versus rectal prolapse.  Recommend correlation with exam.  Scrotal swelling noted with bilateral hydroceles.  Degenerative changes diffusely of the spine.   Review of the MIP images confirms the above findings.  IMPRESSION: No evidence of acute contrast extravasation or active bleeding.  Slight interval enlargement of the lower abdominal and pelvic hematoma with more mass effect on the rectum.   Original Report Authenticated By: Judie Petit. Miles Costain, M.D.  LABS  07/15/2012   WBC  7.7  HGB 12.7  PLTS 279K  CREAT 1.03  K  3.1  PT/PTT PENDING  A/P: Pt with persistent pelvic hematoma creating rectal obstruction. Known hx of bladder cancer with ileal conduit creation 02/2012 / bilateral ureteral stents and open revision of anastomosis 06/2012. Plan is for repeat CT guided attempt at aspiration/possible drainage of hematoma today. Details/risks of procedure d/w pt/family with their understanding and consent.

## 2012-07-16 DIAGNOSIS — A0472 Enterocolitis due to Clostridium difficile, not specified as recurrent: Secondary | ICD-10-CM

## 2012-07-16 DIAGNOSIS — E876 Hypokalemia: Secondary | ICD-10-CM

## 2012-07-16 LAB — BASIC METABOLIC PANEL
BUN: 8 mg/dL (ref 6–23)
Chloride: 110 mEq/L (ref 96–112)
Glucose, Bld: 101 mg/dL — ABNORMAL HIGH (ref 70–99)
Potassium: 3.5 mEq/L (ref 3.5–5.1)

## 2012-07-16 NOTE — Progress Notes (Signed)
[facility] PROGRESS NOTE  CLAUDIO MONDRY WUJ:[id number] DOB: 11/22/1945 DOA: 06/22/2012 PCP: [name], MD    Assessment/Plan:  67 year old male with history of urothelial carcinoma status post robotic cystoprostatectomy in February this year with Intra- corporeal ileal conduit urinary diversion .  Patient also was admitted for MRSA bacteremia in May which was thought in the setting of chronic indwelling left nephrostomy tube and double-J stents. He finished treatment for MRSA bacteremia. He received Vancomycin for 6 weeks.  Patient was also diagnosed of lower extremity DVT and bilateral PE in March of this year and began treatment with therapeutic Lovenox. A CT of the abdomen and pelvis done this admission showed large pelvic fluid collection suggestive hematoma which cannot out early abscess. It was decide to stop anticoagulation. He received 3 Months treatment for PE.  Currently pelvic hematoma is stable and only receiving heparin for DVT prophylaxis.Marland Kitchen  Hospital course complicated by illeus,  C. Diff colitis, and rectal compression cause by hematoma.  Surgery recommend VIR transgluteal evacuation of hematoma. IR was consulted and patient had Transgluteal pelvic hematoma drain placed 7-14.    IIleus/ Hematoma causing rectal compression -Worsening abdominal distension 7-11. Dr Christella Hartigan perform Flex sigmoidoscopy 7-11 for rectal tube placement and decompression. Hematoma felt to be compressing rectum. -continue reglan -TNA DC by gastro 7-13. Started on ensure.  -Continue electrolytes repletion (Goal is K as close to 4 as possible) -continue tx for C. Diff for 4 more days per GI.  -Surgery recommend VIR transgluteal evacuation of hematoma. - IR was consulted and patient had Transgluteal pelvic hematoma drain placed 7-14.   C. Diff colitis -will continue vancomycin day 11. Received 8 days of flagyl.   Pelvic hematoma:  Hematoma stable by CT scan.  Monitor hb on heparin.  -Repeated  CT abd to check for stability on 7/8 demonstrated no increase in size and no significant compromise with intestine. -Repeat CT 7-8 stable pelvic hematoma.  -Probably causing rectal compression. S/P sigmoidoscopy.   Anemia  Received a total of 4 units PRBC during this admission. Hb at 12. Stable.   Hypokalemia  Secondary to GI losses.  KCl 40 meq po TID.  Mg at 2.2.   Hypertension  Blood pressure stable. Will continue metoprolol IV  Hyperlipidemia  Continue holding statin for now and will resume when tolerating PO   hemorrhoids  -General surgery consulted and recommended sitz bath and ice packs for now.  -continue anusol rectal creams.   Fever with mild leucocytosis: Resolved.  Pelvic fluid collection Aspiration negative for bacteria.  Completed treatment with IV vancomycin for MRSA bacteremia. (Vancomycin stopped 6-27).  Urine cx growing yeast, fluconazole stopped after 3 days tx.  -now receiving treatment for c. Diff.   Hx of DVT and bilateral PE :  -repeated CT scan hematoma stable. HB stable at 11. Patient was started on prophylaxis dose heparin.  -Recent Doppler of his lower extremity was negative for DVT.  -Repeated a CT angiogram of the chest which was negative for PE.  -Patient received 3 Month treatment for PE.  -Appreciate Dr Cyndie Chime recommendation. No indication for IVC filter.  -Will continue heparin for DVT prophylaxis.  Bladder cancer: S/P surgery.  Treatment per primary team.  Loopogram negative for leak.   Procedure:  S/P endoscopy 7-1 show: The mucosa of the esophagus appeared normal; likely esophageal Spasm. Fluid/food residue in the gastric fundus, gastric body, and gastric antrum consistent with gastroparesis (felt, at least in part, to explain nausea)  Sigmoidoscopy: The pelvic  hematoma is causing stricture in proximal rectum, distal  sigmoid. The mucosa at the site of narrowing is severely inflammed, friable appearing. A 32 Fr red rubber Rectal  Tube was  placed across the stricture site and placed to gravity drainage     Code Status: Full Family Communication: wife , sister at bedside.  Disposition Plan: to be determine.   Antibiotics:  Vancomycin enemas 07/06/12  IV flagyl 07/06/12  HPI/Subjective: Denies abdominal pain, nausea. Watery stool rectal tube bag.     Objective: Filed Vitals:   07/15/12 1617 07/15/12 1624 07/15/12 2016 07/16/12 0416  BP: 123/84 137/86 121/74 136/77  Pulse: 79 79 72 71  Temp:   97.7 F (36.5 C) 98.1 F (36.7 C)  TempSrc:   Oral Oral  Resp: 9 11 18 19   Height:      Weight:      SpO2: 98% 97% 100% 98%    Intake/Output Summary (Last 24 hours) at 07/16/12 1708 Last data filed at 07/16/12 1300  Gross per 24 hour  Intake   1725 ml  Output   2295 ml  Net   -570 ml   Filed Weights   06/23/12 0405 06/23/12 0449  Weight: 103.2 kg (227 lb 8.2 oz) 100.109 kg (220 lb 11.2 oz)    Exam:   General:  no distress.   Cardiovascular: S1 and S2, no rubs or gallops  Respiratory: CTA bilaterally  Abdomen: softer, less distended, urostomy bag in place, mid surgical wound w/o signs or infection;  BS present.  Musculoskeletal: trace edema bilaterally  Data Reviewed: Basic Metabolic Panel:  Recent Labs Lab 07/11/12 0410  07/13/12 0455 07/13/12 1619 07/14/12 0828 07/15/12 0544 07/16/12 0415  NA 139  < > 137 138 139 141 139  K 3.9  < > 3.1* 3.6 3.4* 3.1* 3.5  CL 104  < > 106 108 108 110 110  CO2 27  < > 23 22 22 21 21   GLUCOSE 135*  < > 133* 129* 129* 109* 101*  BUN 15  < > 15 14 12 10 8   CREATININE 0.83  < > 0.98 0.89 0.87 1.03 0.99  CALCIUM 8.8  < > 8.5 8.4 8.6 8.9 8.9  MG 2.2  --   --   --   --   --  2.0  PHOS 3.9  --   --   --   --   --   --   < > = values in this interval not displayed. CBC:  Recent Labs Lab 07/11/12 0410 07/13/12 0455 07/15/12 0544  WBC 8.5 7.8 7.7  HGB 13.3 12.9* 12.9*  HCT 41.5 40.7 41.2  MCV 94.5 94.4 95.2  PLT 356 283 279   CBG:  Recent  Labs Lab 07/15/12 1213 07/15/12 1645 07/15/12 2014 07/15/12 2346 07/16/12 0419  GLUCAP 125* 94 123* 105* 92    Recent Results (from the past 240 hour(s))  CULTURE, ROUTINE-ABSCESS     Status: None   Collection Time    07/15/12  4:55 PM      Result Value Range Status   Specimen Description PERITONEAL CAVITY   Final   Special Requests NONE   Final   Gram Stain     Final   Value: NO WBC SEEN     NO SQUAMOUS EPITHELIAL CELLS SEEN     NO ORGANISMS SEEN   Culture NO GROWTH   Final   Report Status PENDING   Incomplete     Studies: Ct Guided Abscess  Drain  07/15/2012   *RADIOLOGY REPORT*  Clinical Data/Indication: THE COLONIC OBSTRUCTION SECONDARY TO A PELVIC FLUID COLLECTION.  CT GUIDED ABCESS DRAINAGE WITH CATHETER  Sedation: Versed 2.0 mg, Fentanyl 100 mg.  Total Moderate Sedation Time: 15 minutes.  Procedure: The procedure, risks, benefits, and alternatives were explained to the patient. Questions regarding the procedure were encouraged and answered. The patient understands and consents to the procedure.  Time-out procedure was performed.  The gluteal region in the prone position was prepped with betadine in a sterile fashion, and a sterile drape was applied covering the operative field. A surgical mask and sterile gloves were used for the procedure.  Under CT guidance, an 18 gauge needle was inserted into the pelvic fluid collection via left transgluteal approach and removed over an Amplatz.  A 12-French dilator followed by a 12-French drain were inserted.  It was looped in the fluid collection and string fixed and sewn to the skin. Dark bloody fluid was aspirated.  Findings: Imaging demonstrates placement of a left 12-French transgluteal pelvic fluid collection drain.  Complications: None.  IMPRESSION: Successful left transgluteal pelvic drainage placement into a liquefying hematoma.  Culture was sent.   Original Report Authenticated By: Jolaine Click, M.D.    Scheduled Meds: . famotidine  (PEPCID) IV  20 mg Intravenous Q24H  . feeding supplement  237 mL Oral BID BM  . heparin subcutaneous  5,000 Units Subcutaneous Q8H  . hydrocortisone   Rectal TID  . metoCLOPramide (REGLAN) injection  10 mg Intravenous TID AC & HS  . metoprolol  5 mg Intravenous Q6H  . ondansetron  4 mg Intravenous Q4H  . potassium chloride  40 mEq Oral TID  . vancomycin  250 mg Oral Q6H   Continuous Infusions: . dextrose 5 % and 0.45 % NaCl with KCl 20 mEq/L 50 mL/hr at 07/16/12 1313    Principal Problem:   Pelvic hematoma, male Active Problems:   Bladder cancer s/p robotic prostatecystectomy [id number]   Acute pulmonary embolism (3/14)   DVT (deep venous thrombosis) 3/14   Fever   Bacteremia MRSA 5/14   VTE (venous thromboembolism)   Protein-calorie malnutrition, severe   Acute blood loss anemia   Gastroparesis   Colonic obstruction   Hypokalemia    Time spent: < 25 minutes   Khiree Bukhari  [facility] Pager [phone]. If 7PM-7AM, please contact night-coverage at [email or web address], password [facility] 07/16/2012, 5:08 PM  LOS: 24 days

## 2012-07-16 NOTE — Progress Notes (Signed)
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 Healed well initially and sent home 6/17. Loopogram 6/27 without internal leak.  2 - Fever / h/o Bacteruria - Pt with MRSA in urine by hospital UCX 03/2012 and again subsequently in blood. Now on Vancomycin daily per ID x 4-6 week total (nearing end of course). Most recent UCX, BCX negative, New set pending from 6/22. Pt readmitted with low-grade fever 6/22 to 100.7 and malaise, no sig leukocytosis. Now on Vanc + Zosyn emirically. UCX with yeast (trated diflucan x 3 days), BCX no growth to date.Pelvic fluid aspirate and no growth / final.  ID now following,and DC'd all ABX given eval except for current C. Diff treatment.  3 - Heme / Recent DVT + PE -incidental PE by CT 03/2012 now on Lovenox, DVT resolved by most recent LE duplex. Repeat CT-PE 6/23 without PE or active bleed from pelvis and Lovenox DC'd. Hgb drift to 7 6/24 and transfused 2 u with response to 8, transfused 2u additional 6/25 with increase to 10. Hematology consult [name]) also agrees no treatment dose anticoagulation at this point, but resume proph dosing since he is not ambulating much.  4 - Pelvic Fluid Collection - Pelvic fluid collection without rim-enhancement noted on CT from ER 6/22. Appears partially simple fluid with some dependent blood by HU analysis, no active extrav of blood or urine by imaging. IR aspiration performed 6/26 and fluid c/w hematoma only (not purulent, not simple/drainable). Repeat aspiration with insertion of 48F pigtail performed 7/14 at which time calculated volume using ellipsoid  approximation . Output 150 mL 7/14.  5 - Hemorrhoids / Lower GI Bleed - Long h/o hemorroids. Pt with worsened / friable bleeding hemorroids on admission that are quite bothersome. No prior specific intervention, likely exacerbated by pelvic fluid collection / hematoma. Gen Surg recs conservative measures and now nearly resolved clinically.  6 - Nutrition / Nausea / C. Difficile- Pt with some element of baseline GI issues with GERD and prior small esophageal polyp. Has had exacerbation of nausea in house and not meeting nutritional goals. GI involved and initial EGD + KUB with ileus / gastroparesis picture. While ileus resolving placed on TPN and NGT for symptom relief and to bridge him with nutrition which was resumed 7/11 following successful decompression with rectal tube place with aide of sigmoidoscopy. C. Diff found positive 7/5 and started on dual therapy with flagyl + vanc enemas with plan for 14 day course.   7 - Disposition - PT eval has recommended DC to SNF when appropriate and family amenable.  PMH sig for obesity and left knee replacement, PE + DVT (now resolved). No CV disease.   Today Chad Olson continues to [employer] and PT. JP placed in hematoma continues to put out dark colored fluid c/w hematoma.   Objective: Vital signs in last 24 hours: Temp:  [97.5 F (36.4 C)-98.1 F (36.7 C)] 98.1 F (36.7 C) (07/15 0416) Pulse Rate:  [70-82] 71 (07/15 0416) Resp:  [9-19] 19 (07/15 0416) BP: (115-137)/(72-86) 136/77 mmHg (07/15 0416) SpO2:  [97 %-100 %] 98 % (07/15 0416) Last BM Date: 07/15/12  Intake/Output from previous day: 07/14 0701 - 07/15 0700 In: 1570 [P.O.:600; I.V.:970] Out: Chad Olson.Klinefelter [  Urine:1900; Drains:135; Stool:1650] Intake/Output this shift:    General appearance: alert, cooperative, appears stated age and family at bedside Head: Normocephalic, without obvious abnormality, atraumatic Eyes: conjunctivae/corneas clear. PERRL, EOM's intact. Fundi  benign. Ears: normal TM's and external ear canals both ears Nose: Nares normal. Septum midline. Mucosa normal. No drainage or sinus tenderness. Throat: lips, mucosa, and tongue normal; teeth and gums normal Neck: no adenopathy, no carotid bruit, no JVD, supple, symmetrical, trachea midline and thyroid not enlarged, symmetric, no tenderness/mass/nodules Back: symmetric, no curvature. ROM normal. No CVA tenderness. Resp: clear to auscultation bilaterally Chest wall: no tenderness Cardio: regular rate and rhythm, S1, S2 normal, no murmur, click, rub or gallop GI: soft, non-tender; bowel sounds normal; no masses,  no organomegaly Male genitalia: normal Extremities: extremities normal, atraumatic, no cyanosis or edema and RUE PICC c/d/i. Pulses: 2+ and symmetric Skin: Skin color, texture, turgor normal. No rashes or lesions Lymph nodes: Cervical, supraclavicular, and axillary nodes normal. Neurologic: Grossly normal Incision/Wound: Recent midline incision c/d/i. RLQ Urostomy pink / patent. Left buttocks JP with dark fluid c/w hematoma. Rectal tube in place with liquid stool.  Lab Results:   Recent Labs  07/15/12 0544  WBC 7.7  HGB 12.9*  HCT 41.2  PLT 279   BMET  Recent Labs  07/15/12 0544 07/16/12 0415  NA 141 139  K 3.1* 3.5  CL 110 110  CO2 21 21  GLUCOSE 109* 101*  BUN 10 8  CREATININE 1.03 0.99  CALCIUM 8.9 8.9   PT/INR  Recent Labs  07/15/12 1450  LABPROT 13.9  INR 1.09   ABG No results found for this basename: PHART, PCO2, PO2, HCO3,  in the last 72 hours  Studies/Results: Ct Guided Abscess Drain  07/15/2012   *RADIOLOGY REPORT*  Clinical Data/Indication: THE COLONIC OBSTRUCTION SECONDARY TO A PELVIC FLUID COLLECTION.  CT GUIDED ABCESS DRAINAGE WITH CATHETER  Sedation: Versed 2.0 mg, Fentanyl 100 mg.  Total Moderate Sedation Time: 15 minutes.  Procedure: The procedure, risks, benefits, and alternatives were explained to the patient. Questions regarding the  procedure were encouraged and answered. The patient understands and consents to the procedure.  Time-out procedure was performed.  The gluteal region in the prone position was prepped with betadine in a sterile fashion, and a sterile drape was applied covering the operative field. A surgical mask and sterile gloves were used for the procedure.  Under CT guidance, an 18 gauge needle was inserted into the pelvic fluid collection via left transgluteal approach and removed over an Amplatz.  A 12-French dilator followed by a 12-French drain were inserted.  It was looped in the fluid collection and string fixed and sewn to the skin. Dark bloody fluid was aspirated.  Findings: Imaging demonstrates placement of a left 12-French transgluteal pelvic fluid collection drain.  Complications: None.  IMPRESSION: Successful left transgluteal pelvic drainage placement into a liquefying hematoma.  Culture was sent.   Original Report Authenticated By: Jolaine Click, M.D.    Anti-infectives: Anti-infectives   Start     Dose/Rate Route Frequency Ordered Stop   07/11/12 1200  vancomycin (VANCOCIN) 50 mg/mL oral solution 250 mg     250 mg Oral 4 times per day 07/11/12 1013     07/06/12 1800  vancomycin (VANCOCIN) 500 mg in sodium chloride irrigation 0.9 % 100 mL ENEMA  Status:  Discontinued     500 mg Rectal 4 times per day 07/06/12 1335 07/11/12 1244   07/06/12 1430  metroNIDAZOLE (FLAGYL) IVPB 500 mg  Status:  Discontinued     500 mg 100 mL/hr over 60 Minutes Intravenous 3 times per day 07/06/12 1335 07/14/12 0725   06/25/12 1000  vancomycin (VANCOCIN) 1,250 mg in sodium chloride 0.9 % 250 mL IVPB  Status:  Discontinued     1,250 mg 166.7 mL/hr over 90 Minutes Intravenous Every 12 hours 06/25/12 0856 06/28/12 1102   06/24/12 2000  fluconazole (DIFLUCAN) IVPB 200 mg     200 mg 100 mL/hr over 60 Minutes Intravenous Every 24 hours 06/24/12 1733 06/26/12 2322   06/23/12 0900  vancomycin (VANCOCIN) 1,250 mg in sodium  chloride 0.9 % 250 mL IVPB     1,250 mg 166.7 mL/hr over 90 Minutes Intravenous Every 12 hours 06/23/12 0522 06/24/12 2231   06/23/12 0600  piperacillin-tazobactam (ZOSYN) IVPB 3.375 g  Status:  Discontinued     3.375 g 12.5 mL/hr over 240 Minutes Intravenous 3 times per day 06/23/12 0447 06/28/12 1102   06/23/12 0330  cefTRIAXone (ROCEPHIN) 1 g in dextrose 5 % 50 mL IVPB     1 g 100 mL/hr over 30 Minutes Intravenous  Once 06/23/12 0319 06/23/12 0440      Assessment/Plan:  1 - Bladder Cancer / Cystectomy / Ureteral Revision- NED form cancer perspective.Loopogram without leak this admission. WIll consider staple removal and left neph tube removal under fluoro later this admission when back to baseline in terms of GI status.  2 - Fever / h/o Bacteruria - Now off wound-specific ABX given negative CX from blood, urine, pelvic fluid.  3 - Heme / Recent DVT + PE -Lovenox now stopped. SCD's in place.  . PT following. Remain proph SQ heparin + SCD's. He is now making much more progress in terms of ambulation and PT.  4 - Pelvic Fluid Collection - Imaging and aspirate c/w non-infected hemaotma. Size stable / decreasing as expected. Eplained to pt this will resolve, but slowly (weeks-mos). To help expedite resolution IR drain placed 7/14, optimistic this will be able to aspirate a meaningful amt of hematoma to decrease colonic compression, Estimate would need to aspirate plus to achieve 50% reduction in volume based on volume estimate.  5 - Hemorrhoids / Lower GI Bleed - Agree with conservative measures for now. Hgb stable.  6 - Nutrition / Nausea / C. Difficile- Remains central active issue. Agree with aggressive treatment for C. Diff and now nearing end of course. Clearly feels better with rectal tube. Further options including rectal tube / low residue diet v. Diverting colostomy with or withotu pelvic hematoma evacuation v. Transcolonic hematoma evaculation discussed. Will keep with current  plan of rectal tube now augmented by hematoma drain to help expedite resolution. Would not be eager to remove rectal tube or re-image until at least several hundred mL hematoma fluid evacuated.  7 - Disposition - SNF pending nutritional status and ileus resolution.  Greatly appreciate hospitalist, gen surgery, ID, hematology,PT, GI, TPN pharmacy input.  [facility], Nala Kachel 07/16/2012

## 2012-07-16 NOTE — Progress Notes (Signed)
CSW still following even though it is likely patient will go home. CSW speaks with family daily to offer emotional support.  Yeray Tomas C. Chera Slivka MSW, LCSW [phone]

## 2012-07-16 NOTE — Progress Notes (Signed)
PT Cancellation Note  Patient Details Name: Chad Olson MRN: [id number] DOB: [birth year]   Cancelled Treatment:     Met family in the hall ( sister and wife?) on my way to see pt for PT visit. They stated pt has just received his pain medicine and is sleeping now.  They also state they have been getting him up and walking him in the room and they likely don't need PT. I asked about d/c plan and they said they don't know "We're taking it one day at a time".  MD: if you would like pt to progress with ambulation more aggressively and receive PT treatments, please talk with family.  I have tried several times to see pt to increase mobility and ambulation, but family defers PT . Will check in with patient one more time before we discontinue PT attempts. Thank you  Bayard Hugger . [place], [place] [phone] 07/16/2012, 11:33 AM

## 2012-07-16 NOTE — Progress Notes (Signed)
Subjective: Patient with history of bladder Cancer with ileal conduit. CT on 07/09/2012 demonstrated 13 cm pelvic hematoma. Patient is s/p percutaneous pelvic drain implanted 07/15/12. Patient states drain has been having great output and he is feeling better. Currently on Vanco and flagyl for C. Diff  Objective: Physical Exam: BP 136/77  Pulse 71  Temp(Src) 98.1 F (36.7 C) (Oral)  Resp 19  Ht 5\' 11"  (1.803 m)  Wt 220 lb 11.2 oz (100.109 kg)  BMI 30.8 kg/m2  SpO2 98% Pelvic Drain: 135 cc blood output, flushes easily. Pelvic drain incision C/D/I, no signs of infection.    Labs: CBC  Recent Labs  07/15/12 0544  WBC 7.7  HGB 12.9*  HCT 41.2  PLT 279   BMET  Recent Labs  07/15/12 0544 07/16/12 0415  NA 141 139  K 3.1* 3.5  CL 110 110  CO2 21 21  GLUCOSE 109* 101*  BUN 10 8  CREATININE 1.03 0.99  CALCIUM 8.9 8.9   LFT No results found for this basename: PROT, ALBUMIN, AST, ALT, ALKPHOS, BILITOT, BILIDIR, IBILI, LIPASE,  in the last 72 hours PT/INR  Recent Labs  07/15/12 1450  LABPROT 13.9  INR 1.09     Studies/Results: Ct Guided Abscess Drain  07/15/2012   *RADIOLOGY REPORT*  Clinical Data/Indication: THE COLONIC OBSTRUCTION SECONDARY TO A PELVIC FLUID COLLECTION.  CT GUIDED ABCESS DRAINAGE WITH CATHETER  Sedation: Versed 2.0 mg, Fentanyl 100 mg.  Total Moderate Sedation Time: 15 minutes.  Procedure: The procedure, risks, benefits, and alternatives were explained to the patient. Questions regarding the procedure were encouraged and answered. The patient understands and consents to the procedure.  Time-out procedure was performed.  The gluteal region in the prone position was prepped with betadine in a sterile fashion, and a sterile drape was applied covering the operative field. A surgical mask and sterile gloves were used for the procedure.  Under CT guidance, an 18 gauge needle was inserted into the pelvic fluid collection via left transgluteal approach and  removed over an Amplatz.  A 12-French dilator followed by a 12-French drain were inserted.  It was looped in the fluid collection and string fixed and sewn to the skin. Dark bloody fluid was aspirated.  Findings: Imaging demonstrates placement of a left 12-French transgluteal pelvic fluid collection drain.  Complications: None.  IMPRESSION: Successful left transgluteal pelvic drainage placement into a liquefying hematoma.  Culture was sent.   Original Report Authenticated By: Jolaine Click, M.D.    Assessment/Plan: Bladder Cancer with ileal conduit s/p pelvic hematoma drain placed 07/15/12. Drain output 135cc and flushes easily.  WBC WNL, afebrile. Will follow.    LOS: 24 days    Berneta Levins PA-C 07/16/2012 9:02 AM

## 2012-07-16 NOTE — Progress Notes (Addendum)
[facility] Progress Note  Subjective:  Feels ok.  Abdomen still doing well, but a little soreness on buttocks where drain was placed.  >135 mL of bloody fluid in drain so far.  Objective:  Vital signs in last 24 hours: Temp:  [97.5 F (36.4 C)-98.1 F (36.7 C)] 98.1 F (36.7 C) (07/15 0416) Pulse Rate:  [70-82] 71 (07/15 0416) Resp:  [9-19] 19 (07/15 0416) BP: (115-137)/(72-86) 136/77 mmHg (07/15 0416) SpO2:  [97 %-100 %] 98 % (07/15 0416) Last BM Date: 07/15/12 General:   Alert, Well-developed, in NAD Heart:  Regular rate and rhythm; no murmurs Pulm:  CTAB.  No W/R/R. Abdomen:  Soft, non-tender and non-distended.  Normal bowel sounds. Extremities:  Without edema. Neurologic:  Alert and  oriented x4;  grossly normal neurologically. Psych:  Alert and cooperative. Normal mood and affect.  Intake/Output from previous day: 07/14 0701 - 07/15 0700 In: 1575 [P.O.:600; I.V.:970] Out: 3685 [Urine:1900; Drains:135; Stool:1650]  Lab Results:  Recent Labs  07/15/12 0544  WBC 7.7  HGB 12.9*  HCT 41.2  PLT 279   BMET  Recent Labs  07/14/12 0828 07/15/12 0544 07/16/12 0415  NA 139 141 139  K 3.4* 3.1* 3.5  CL 108 110 110  CO2 22 21 21   GLUCOSE 129* 109* 101*  BUN 12 10 8   CREATININE 0.87 1.03 0.99  CALCIUM 8.6 8.9 8.9   PT/INR  Recent Labs  07/15/12 1450  LABPROT 13.9  INR 1.09   Ct Guided Abscess Drain  07/15/2012   *RADIOLOGY REPORT*  Clinical Data/Indication: THE COLONIC OBSTRUCTION SECONDARY TO A PELVIC FLUID COLLECTION.  CT GUIDED ABCESS DRAINAGE WITH CATHETER  Sedation: Versed 2.0 mg, Fentanyl 100 mg.  Total Moderate Sedation Time: 15 minutes.  Procedure: The procedure, risks, benefits, and alternatives were explained to the patient. Questions regarding the procedure were encouraged and answered. The patient understands and consents to the procedure.  Time-out procedure was performed.  The gluteal region in the prone position was prepped with  betadine in a sterile fashion, and a sterile drape was applied covering the operative field. A surgical mask and sterile gloves were used for the procedure.  Under CT guidance, an 18 gauge needle was inserted into the pelvic fluid collection via left transgluteal approach and removed over an Amplatz.  A 12-French dilator followed by a 12-French drain were inserted.  It was looped in the fluid collection and string fixed and sewn to the skin. Dark bloody fluid was aspirated.  Findings: Imaging demonstrates placement of a left 12-French transgluteal pelvic fluid collection drain.  Complications: None.  IMPRESSION: Successful left transgluteal pelvic drainage placement into a liquefying hematoma.  Culture was sent.   Original Report Authenticated By: Jolaine Click, M.D.    Assessment / Plan: 67 y.o. male with rectal obstruction/ischemia from 13cm pelvic hematoma, deconditioning, Cdiff infection, bladder cancer s/p several urinary surgeries/complications.   Continue po vanc another 3 days for the Cdiff, then ok to discontinue it. S/p flexible sigmoidoscopy with 32 Fr red rubber rectal tube placed for decompression, which is working well.  S/p transgluteal drain placed into the liquifying hematoma by IR yesterday.  Leave rectal tube in place for now.  ? Need to re-image the pelvis in the near future to determine progress.     LOS: 24 days   ZEHR, JESSICA D.  07/16/2012, 8:58 AM  Pager number [phone]  Plan to maintain rectal tube for 24 hours more then try pulling.  I have personally taken an  interval history, reviewed the chart, and examined the patient.  I agree with the extender's note, impression and recommendations.  Barbette Hair. Arlyce Dice, MD, [facility] [phone]

## 2012-07-16 NOTE — Progress Notes (Signed)
Tol full liquids Abd soft drained out from hematoma so far  At this point, we would recommend to continue the drain.  Even partial decompression would be enough to help the rectum from getting severely compressed.   If that is not successful, consider repeat endoscopy/rectal tubes & upsize the drain.   If not, temporary rectal stent.   Operative transrectal drainage/evacuation with or without the help of the TEM is another possibility.   I would reserve ostomy diversion with transabdominal pelvic hematoma evacuationas last. That would involve two procedures (ostomy creation & then takedown and significant morbidity. His urologist wishes to avoid any more operations as well. Options and questions were discussed and answered. Patient and his wife and family expressed understanding and appreciation.

## 2012-07-16 NOTE — Consult Note (Signed)
As above See separate note

## 2012-07-16 NOTE — Progress Notes (Signed)
4 Days Post-Op  Subjective: Pt feels much better today.  Less pain/pressure in his rectum.  Continues to have BM's.  Drain has drained >138mL bloody drainage, minimal pain at drain site.  Ambulating some.  Urinating and tolerating full liquids.  Objective: Vital signs in last 24 hours: Temp:  [97.5 F (36.4 C)-98.1 F (36.7 C)] 98.1 F (36.7 C) (07/15 0416) Pulse Rate:  [70-82] 71 (07/15 0416) Resp:  [9-19] 19 (07/15 0416) BP: (115-137)/(72-86) 136/77 mmHg (07/15 0416) SpO2:  [97 %-100 %] 98 % (07/15 0416) Last BM Date: 07/15/12  Intake/Output from previous day: 07/14 0701 - 07/15 0700 In: 1575 [P.O.:600; I.V.:970] Out: 3685 [Urine:1900; Drains:135; Stool:1650] Intake/Output this shift:    PE: Gen:  Alert, NAD, pleasant Abd: Soft, NT/ND, +BS, no HSM, midline lower staples still in place (per urology), drain with sanguinous drainage, ilealconduit seen in Right side of abdomen  Lab Results:   Recent Labs  07/15/12 0544  WBC 7.7  HGB 12.9*  HCT 41.2  PLT 279   BMET  Recent Labs  07/15/12 0544 07/16/12 0415  NA 141 139  K 3.1* 3.5  CL 110 110  CO2 21 21  GLUCOSE 109* 101*  BUN 10 8  CREATININE 1.03 0.99  CALCIUM 8.9 8.9   PT/INR  Recent Labs  07/15/12 1450  LABPROT 13.9  INR 1.09   CMP     Component Value Date/Time   NA 139 07/16/2012 0415   NA 139 04/18/2012 0801   K 3.5 07/16/2012 0415   K 3.9 04/18/2012 0801   CL 110 07/16/2012 0415   CL 105 04/18/2012 0801   CO2 21 07/16/2012 0415   CO2 22 04/18/2012 0801   GLUCOSE 101* 07/16/2012 0415   GLUCOSE 121* 04/18/2012 0801   BUN 8 07/16/2012 0415   BUN 9.1 04/18/2012 0801   CREATININE 0.99 07/16/2012 0415   CREATININE 1.1 04/18/2012 0801   CALCIUM 8.9 07/16/2012 0415   CALCIUM 9.3 04/18/2012 0801   PROT 6.2 07/11/2012 0410   PROT 7.3 04/18/2012 0801   ALBUMIN 2.7* 07/11/2012 0410   ALBUMIN 2.7* 04/18/2012 0801   AST 15 07/11/2012 0410   AST 16 04/18/2012 0801   ALT 15 07/11/2012 0410   ALT 22 04/18/2012 0801   ALKPHOS 63 07/11/2012 0410   ALKPHOS 82 04/18/2012 0801   BILITOT 0.3 07/11/2012 0410   BILITOT 0.46 04/18/2012 0801   GFRNONAA 83* 07/16/2012 0415   GFRAA >90 07/16/2012 0415   Lipase     Component Value Date/Time   LIPASE 25 05/13/2012 1650       Studies/Results: Ct Guided Abscess Drain  07/15/2012   *RADIOLOGY REPORT*  Clinical Data/Indication: THE COLONIC OBSTRUCTION SECONDARY TO A PELVIC FLUID COLLECTION.  CT GUIDED ABCESS DRAINAGE WITH CATHETER  Sedation: Versed 2.0 mg, Fentanyl 100 mg.  Total Moderate Sedation Time: 15 minutes.  Procedure: The procedure, risks, benefits, and alternatives were explained to the patient. Questions regarding the procedure were encouraged and answered. The patient understands and consents to the procedure.  Time-out procedure was performed.  The gluteal region in the prone position was prepped with betadine in a sterile fashion, and a sterile drape was applied covering the operative field. A surgical mask and sterile gloves were used for the procedure.  Under CT guidance, an 18 gauge needle was inserted into the pelvic fluid collection via left transgluteal approach and removed over an Amplatz.  A 12-French dilator followed by a 12-French drain were inserted.  It was looped  in the fluid collection and string fixed and sewn to the skin. Dark bloody fluid was aspirated.  Findings: Imaging demonstrates placement of a left 12-French transgluteal pelvic fluid collection drain.  Complications: None.  IMPRESSION: Successful left transgluteal pelvic drainage placement into a liquefying hematoma.  Culture was sent.   Original Report Authenticated By: Jolaine Click, M.D.    Anti-infectives: Anti-infectives   Start     Dose/Rate Route Frequency Ordered Stop   07/11/12 1200  vancomycin (VANCOCIN) 50 mg/mL oral solution 250 mg     250 mg Oral 4 times per day 07/11/12 1013     07/06/12 1800  vancomycin (VANCOCIN) 500 mg in sodium chloride irrigation 0.9 % 100 mL ENEMA  Status:   Discontinued     500 mg Rectal 4 times per day 07/06/12 1335 07/11/12 1244   07/06/12 1430  metroNIDAZOLE (FLAGYL) IVPB 500 mg  Status:  Discontinued     500 mg 100 mL/hr over 60 Minutes Intravenous 3 times per day 07/06/12 1335 07/14/12 0725   06/25/12 1000  vancomycin (VANCOCIN) 1,250 mg in sodium chloride 0.9 % 250 mL IVPB  Status:  Discontinued     1,250 mg 166.7 mL/hr over 90 Minutes Intravenous Every 12 hours 06/25/12 0856 06/28/12 1102   06/24/12 2000  fluconazole (DIFLUCAN) IVPB 200 mg     200 mg 100 mL/hr over 60 Minutes Intravenous Every 24 hours 06/24/12 1733 06/26/12 2322   06/23/12 0900  vancomycin (VANCOCIN) 1,250 mg in sodium chloride 0.9 % 250 mL IVPB     1,250 mg 166.7 mL/hr over 90 Minutes Intravenous Every 12 hours 06/23/12 0522 06/24/12 2231   06/23/12 0600  piperacillin-tazobactam (ZOSYN) IVPB 3.375 g  Status:  Discontinued     3.375 g 12.5 mL/hr over 240 Minutes Intravenous 3 times per day 06/23/12 0447 06/28/12 1102   06/23/12 0330  cefTRIAXone (ROCEPHIN) 1 g in dextrose 5 % 50 mL IVPB     1 g 100 mL/hr over 30 Minutes Intravenous  Once 06/23/12 0319 06/23/12 0440       Assessment/Plan Rectal obstruction from 13cm pelvic hematoma s/p multiple urologic surgeries, currently with rectal tube  Bladder cancer  C. 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  IR was able to aspirate 20-36mL bloody drainage yesterday and they left the drain and drained a total of since yesterday 2.  Would continue conservatively for now (rectal tube and abdominal drain) 3.  If unsuccessful continue drain and think about rectal stent 4.  Last resort would take to surgery 5.  Ambulate and IS 6.  SCD's and heparin   LOS: 24 days    [name], Vickii Volland 07/16/2012, 9:17 AM Pager: [phone]

## 2012-07-17 LAB — BASIC METABOLIC PANEL
BUN: 7 mg/dL (ref 6–23)
Calcium: 9.1 mg/dL (ref 8.4–10.5)
Creatinine, Ser: 0.99 mg/dL (ref 0.50–1.35)
GFR calc Af Amer: >90 mL/min (ref >90)
GFR calc non Af Amer: 83 mL/min — ABNORMAL LOW (ref >90)

## 2012-07-17 LAB — CBC
HCT: 41.6 % (ref 39.0–52.0)
MCHC: 32 g/dL (ref 30.0–36.0)
MCV: 93.7 fL (ref 78.0–100.0)
RDW: 16.3 % — ABNORMAL HIGH (ref 11.5–15.5)

## 2012-07-17 MED ORDER — FAMOTIDINE 20 MG PO TABS
20.0000 mg | ORAL_TABLET | Freq: Every day | ORAL | Status: DC
  Administered 2012-07-18 – 2012-07-31 (×12): 20 mg via ORAL
  Filled 2012-07-17 (×15): qty 1

## 2012-07-17 MED ORDER — BOOST / RESOURCE BREEZE PO LIQD
1.0000 | ORAL | Status: DC
  Administered 2012-07-18: 1 via ORAL

## 2012-07-17 MED ORDER — POTASSIUM CHLORIDE CRYS ER 20 MEQ PO TBCR
40.0000 meq | EXTENDED_RELEASE_TABLET | Freq: Once | ORAL | Status: AC
  Administered 2012-07-17: 40 meq via ORAL
  Filled 2012-07-17: qty 2

## 2012-07-17 MED ORDER — ENSURE COMPLETE PO LIQD
237.0000 mL | Freq: Three times a day (TID) | ORAL | Status: DC
  Administered 2012-07-17 – 2012-08-09 (×35): 237 mL via ORAL

## 2012-07-17 MED ORDER — ENSURE PUDDING PO PUDG
1.0000 | ORAL | Status: DC
  Administered 2012-07-17 – 2012-07-18 (×2): 1 via ORAL
  Filled 2012-07-17 (×3): qty 1

## 2012-07-17 NOTE — Progress Notes (Signed)
[facility] PROGRESS NOTE  KOLTER REAVER WUJ:[id number] DOB: [birth year] DOA: 06/22/2012 PCP: [name], MD    Assessment/Plan:  67 year old male with history of urothelial carcinoma status post robotic cystoprostatectomy in February this year with Intra- corporeal ileal conduit urinary diversion .  Patient also was admitted for MRSA bacteremia in May which was thought in the setting of chronic indwelling left nephrostomy tube and double-J stents. He finished treatment for MRSA bacteremia. He received Vancomycin for 6 weeks.  Patient was also diagnosed of lower extremity DVT and bilateral PE in March of this year and began treatment with therapeutic Lovenox. A CT of the abdomen and pelvis done this admission showed large pelvic fluid collection suggestive hematoma which cannot out early abscess. It was decide to stop anticoagulation. He received 3 Months treatment for PE.  Currently pelvic hematoma is stable and only receiving heparin for DVT prophylaxis.Marland Kitchen  Hospital course complicated by illeus,  C. Diff colitis, and rectal compression cause by hematoma.  Surgery recommend VIR transgluteal evacuation of hematoma. IR was consulted and patient had Transgluteal pelvic hematoma drain placed 7-14.    IIleus/ Hematoma causing rectal compression -Worsening abdominal distension 7-11. Dr Christella Hartigan perform Flex sigmoidoscopy 7-11 for rectal tube placement and decompression. Hematoma felt to be compressing rectum. -continue reglan -TNA DC by gastro 7-13. Started on ensure.  -Continue electrolytes repletion (Goal is K as close to 4 as possible) -continue tx for C. Diff for 2 more days per GI.  - IR was consulted and patient had Transgluteal pelvic hematoma drain placed 7-14. About 200 cc of fluid out since placement.  C. Diff colitis -will continue vancomycin day 12. Received 8 days of flagyl.   Pelvic hematoma:  Hematoma stable by CT scan.  Monitor hb on heparin.  -Repeated CT abd to check  for stability on 7/8 demonstrated no increase in size and no significant compromise with intestine. -Repeat CT 7-8 stable pelvic hematoma.  -Probably causing rectal compression. S/P sigmoidoscopy.   Anemia  Received a total of 4 units PRBC during this admission. Hb at 12. Stable.   Hypokalemia  Secondary to GI losses.  KCl 40 meq po TID.  Mg at 2.2.   Hypertension  Blood pressure stable. Will continue metoprolol IV  Hyperlipidemia  Continue holding statin for now and will resume when tolerating PO   hemorrhoids  -General surgery consulted and recommended sitz bath and ice packs for now.  -continue anusol rectal creams.   Fever with mild leucocytosis: Resolved.  Pelvic fluid collection Aspiration negative for bacteria.  Completed treatment with IV vancomycin for MRSA bacteremia. (Vancomycin stopped 6-27).  Urine cx growing yeast, fluconazole stopped after 3 days tx.  -now receiving treatment for c. Diff.   Hx of DVT and bilateral PE :  -repeated CT scan hematoma stable. HB stable at 11. Patient was started on prophylaxis dose heparin.  -Recent Doppler of his lower extremity was negative for DVT.  -Repeated a CT angiogram of the chest which was negative for PE.  -Patient received 3 Month treatment for PE.  -Appreciate Dr Cyndie Chime recommendation. No indication for IVC filter.  -Will continue heparin for DVT prophylaxis.  Bladder cancer: S/P surgery.  Treatment per primary team.  Loopogram negative for leak.   Procedure:  S/P endoscopy 7-1 show: The mucosa of the esophagus appeared normal; likely esophageal Spasm. Fluid/food residue in the gastric fundus, gastric body, and gastric antrum consistent with gastroparesis (felt, at least in part, to explain nausea)  Sigmoidoscopy: The pelvic  hematoma is causing stricture in proximal rectum, distal  sigmoid. The mucosa at the site of narrowing is severely inflammed, friable appearing. A 32 Fr red rubber Rectal Tube was  placed  across the stricture site and placed to gravity drainage     Code Status: Full Family Communication: wife , sister at bedside.  Disposition Plan: to be determine.   Antibiotics:  Vancomycin enemas 07/06/12  IV flagyl 07/06/12  HPI/Subjective: Denies abdominal pain, nausea. Watery stool rectal tube bag.     Objective: Filed Vitals:   07/16/12 1500 07/16/12 2039 07/17/12 0435 07/17/12 1443  BP: 127/72 122/64 122/61 130/78  Pulse: 78 80 86 90  Temp: 98.1 F (36.7 C) 97.7 F (36.5 C) 97.8 F (36.6 C) 97.4 F (36.3 C)  TempSrc: Oral Oral Oral Oral  Resp: 18 18 18 16   Height:      Weight:      SpO2: 98% 100% 99% 100%    Intake/Output Summary (Last 24 hours) at 07/17/12 1555 Last data filed at 07/17/12 1442  Gross per 24 hour  Intake 3979.17 ml  Output   4845 ml  Net -865.83 ml   Filed Weights   06/23/12 0405 06/23/12 0449  Weight: 103.2 kg (227 lb 8.2 oz) 100.109 kg (220 lb 11.2 oz)    Exam:   General:  no distress.   Cardiovascular: S1 and S2, no rubs or gallops  Respiratory: CTA bilaterally  Abdomen: softer, less distended, urostomy bag in place, mid surgical wound w/o signs or infection;  BS present.  Musculoskeletal: trace edema bilaterally  Data Reviewed: Basic Metabolic Panel:  Recent Labs Lab 07/11/12 0410  07/13/12 1619 07/14/12 0828 07/15/12 0544 07/16/12 0415 07/17/12 0519  NA 139  < > 138 139 141 139 141  K 3.9  < > 3.6 3.4* 3.1* 3.5 3.4*  CL 104  < > 108 108 110 110 109  CO2 27  < > 22 22 21 21 22   GLUCOSE 135*  < > 129* 129* 109* 101* 112*  BUN 15  < > 14 12 10 8 7   CREATININE 0.83  < > 0.89 0.87 1.03 0.99 0.99  CALCIUM 8.8  < > 8.4 8.6 8.9 8.9 9.1  MG 2.2  --   --   --   --  2.0  --   PHOS 3.9  --   --   --   --   --   --   < > = values in this interval not displayed. CBC:  Recent Labs Lab 07/11/12 0410 07/13/12 0455 07/15/12 0544 07/17/12 0519  WBC 8.5 7.8 7.7 7.9  HGB 13.3 12.9* 12.9* 13.3  HCT 41.5 40.7 41.2 41.6   MCV 94.5 94.4 95.2 93.7  PLT 356 283 279 252   CBG:  Recent Labs Lab 07/15/12 1213 07/15/12 1645 07/15/12 2014 07/15/12 2346 07/16/12 0419  GLUCAP 125* 94 123* 105* 92    Recent Results (from the past 240 hour(s))  CULTURE, ROUTINE-ABSCESS     Status: None   Collection Time    07/15/12  4:55 PM      Result Value Range Status   Specimen Description PERITONEAL CAVITY   Final   Special Requests NONE   Final   Gram Stain     Final   Value: NO WBC SEEN     NO SQUAMOUS EPITHELIAL CELLS SEEN     NO ORGANISMS SEEN   Culture NO GROWTH 1 DAY   Final   Report Status PENDING  Incomplete     Studies: Ct Guided Abscess Drain  07/15/2012   *RADIOLOGY REPORT*  Clinical Data/Indication: THE COLONIC OBSTRUCTION SECONDARY TO A PELVIC FLUID COLLECTION.  CT GUIDED ABCESS DRAINAGE WITH CATHETER  Sedation: Versed 2.0 mg, Fentanyl 100 mg.  Total Moderate Sedation Time: 15 minutes.  Procedure: The procedure, risks, benefits, and alternatives were explained to the patient. Questions regarding the procedure were encouraged and answered. The patient understands and consents to the procedure.  Time-out procedure was performed.  The gluteal region in the prone position was prepped with betadine in a sterile fashion, and a sterile drape was applied covering the operative field. A surgical mask and sterile gloves were used for the procedure.  Under CT guidance, an 18 gauge needle was inserted into the pelvic fluid collection via left transgluteal approach and removed over an Amplatz.  A 12-French dilator followed by a 12-French drain were inserted.  It was looped in the fluid collection and string fixed and sewn to the skin. Dark bloody fluid was aspirated.  Findings: Imaging demonstrates placement of a left 12-French transgluteal pelvic fluid collection drain.  Complications: None.  IMPRESSION: Successful left transgluteal pelvic drainage placement into a liquefying hematoma.  Culture was sent.   Original Report  Authenticated By: Jolaine Click, M.D.    Scheduled Meds: . [START ON 07/18/2012] famotidine  20 mg Oral Daily  . feeding supplement  237 mL Oral TID WC  . heparin subcutaneous  5,000 Units Subcutaneous Q8H  . hydrocortisone   Rectal TID  . metoCLOPramide (REGLAN) injection  10 mg Intravenous TID AC & HS  . metoprolol  5 mg Intravenous Q6H  . ondansetron  4 mg Intravenous Q4H  . potassium chloride  40 mEq Oral TID  . vancomycin  250 mg Oral Q6H   Continuous Infusions: . dextrose 5 % and 0.45 % NaCl with KCl 20 mEq/L 50 mL/hr at 07/16/12 1313    Principal Problem:   Pelvic hematoma, male Active Problems:   Bladder cancer s/p robotic prostatecystectomy [id number]   Acute pulmonary embolism (3/14)   DVT (deep venous thrombosis) 3/14   Fever   Bacteremia MRSA 5/14   VTE (venous thromboembolism)   Protein-calorie malnutrition, severe   Acute blood loss anemia   Gastroparesis   Colonic obstruction   Hypokalemia    Time spent: < 25 minutes   HERNANDEZ ACOSTA,ESTELA  [facility] Pager [phone]. If 7PM-7AM, please contact night-coverage at [email or web address], password [facility] 07/17/2012, 3:55 PM  LOS: 25 days

## 2012-07-17 NOTE — Progress Notes (Signed)
Physical Therapy Treatment Patient Details Name: JODEY BURBANO MRN: [id number] DOB: Aug 12, 1945 Today's Date: 07/17/2012 Time: 5621-3086 PT Time Calculation (min): 15 min  PT Assessment / Plan / Recommendation  PT Comments   **Pt ambulated 22' with RW and min A. Distance limited by pain and fatigue. Pt hadn't walked in several days, so this is progress. *  Follow Up Recommendations  SNF     Does the patient have the potential to tolerate intense rehabilitation     Barriers to Discharge        Equipment Recommendations  None recommended by PT    Recommendations for Other Services    Frequency Min 3X/week   Progress towards PT Goals Progress towards PT goals: Progressing toward goals  Plan Current plan remains appropriate    Precautions / Restrictions Precautions Precautions: Fall Precaution Comments: transgluteal drain, rectal tube Restrictions Weight Bearing Restrictions: No   Pertinent Vitals/Pain **9/10 gluteal drain site Encouraged pt to call for pain meds if wanted*    Mobility  Bed Mobility Bed Mobility: Supine to Sit;Sit to Supine Supine to Sit: HOB elevated;3: Mod assist Sit to Supine: 3: Mod assist;With rail Details for Bed Mobility Assistance: assist to elevate trunk for supine to sit, and to bring LEs into bed for sit to supine Transfers Transfers: Sit to Stand;Stand to Sit Sit to Stand: From bed;4: Min assist Stand to Sit: To bed;4: Min guard Details for Transfer Assistance: VCs hand placement Ambulation/Gait Ambulation/Gait Assistance: 4: Min guard Ambulation Distance (Feet): 22 Feet Assistive device: Rolling walker Ambulation/Gait Assistance Details: decreased distance 2* pain at gluteal drain site and fatigue Gait Pattern: Step-through pattern;Decreased stride length;Wide base of support;Trunk flexed Gait velocity: decreased    Exercises General Exercises - Lower Extremity Hip Flexion/Marching: Both;10 reps;AROM;Standing   PT Diagnosis:    PT  Problem List:   PT Treatment Interventions:     PT Goals (current goals can now be found in the care plan section) Acute Rehab PT Goals PT Goal Formulation: With patient/family Time For Goal Achievement: 07/31/12 Potential to Achieve Goals: Good  Visit Information  Last PT Received On: 07/17/12 Assistance Needed: +1 History of Present Illness: s/p transgluteal drain 7/14 for hematoma    Subjective Data      Cognition  Cognition Arousal/Alertness: Awake/alert Behavior During Therapy: [facility] for tasks assessed/performed (pt smiling and joking some) Overall Cognitive Status: Within Functional Limits for tasks assessed    Balance     End of Session PT - End of Session Equipment Utilized During Treatment: Gait belt Activity Tolerance: Patient limited by fatigue Patient left: in bed;with call bell/phone within reach;with family/visitor present Nurse Communication: Mobility status   GP     Ralene Bathe Kistler 07/17/2012, 1:44 PM [phone]

## 2012-07-17 NOTE — Progress Notes (Signed)
5 Days Post-Op  Subjective: Pt stable; has some soreness at TG drain site as expected  Objective: Vital signs in last 24 hours: Temp:  [97.7 F (36.5 C)-98.1 F (36.7 C)] 97.8 F (36.6 C) (07/16 0435) Pulse Rate:  [78-86] 86 (07/16 0435) Resp:  [18] 18 (07/16 0435) BP: (122-127)/(61-72) 122/61 mmHg (07/16 0435) SpO2:  [98 %-100 %] 99 % (07/16 0435) Last BM Date: 07/16/12  Intake/Output from previous day: 07/15 0701 - 07/16 0700 In: 3344.2 [P.O.:720; I.V.:1569.2] Out: 4410 [Urine:4350; Drains:60] Intake/Output this shift: Total I/O In: 360 [P.O.:360] Out: 65 [Drains:65]  TG drain intact, dressing dry, site mild-mod tender to palpation; drain flushed with 20 cc's sterile NS with return of about 30 cc's bloody fluid  Lab Results:   Recent Labs  07/15/12 0544 07/17/12 0519  WBC 7.7 7.9  HGB 12.9* 13.3  HCT 41.2 41.6  PLT 279 252   BMET  Recent Labs  07/16/12 0415 07/17/12 0519  NA 139 141  K 3.5 3.4*  CL 110 109  CO2 21 22  GLUCOSE 101* 112*  BUN 8 7  CREATININE 0.99 0.99  CALCIUM 8.9 9.1   PT/INR  Recent Labs  07/15/12 1450  LABPROT 13.9  INR 1.09   ABG No results found for this basename: PHART, PCO2, PO2, HCO3,  in the last 72 hours  Studies/Results: Ct Guided Abscess Drain  07/15/2012   *RADIOLOGY REPORT*  Clinical Data/Indication: THE COLONIC OBSTRUCTION SECONDARY TO A PELVIC FLUID COLLECTION.  CT GUIDED ABCESS DRAINAGE WITH CATHETER  Sedation: Versed 2.0 mg, Fentanyl 100 mg.  Total Moderate Sedation Time: 15 minutes.  Procedure: The procedure, risks, benefits, and alternatives were explained to the patient. Questions regarding the procedure were encouraged and answered. The patient understands and consents to the procedure.  Time-out procedure was performed.  The gluteal region in the prone position was prepped with betadine in a sterile fashion, and a sterile drape was applied covering the operative field. A surgical mask and sterile gloves were  used for the procedure.  Under CT guidance, an 18 gauge needle was inserted into the pelvic fluid collection via left transgluteal approach and removed over an Amplatz.  A 12-French dilator followed by a 12-French drain were inserted.  It was looped in the fluid collection and string fixed and sewn to the skin. Dark bloody fluid was aspirated.  Findings: Imaging demonstrates placement of a left 12-French transgluteal pelvic fluid collection drain.  Complications: None.  IMPRESSION: Successful left transgluteal pelvic drainage placement into a liquefying hematoma.  Culture was sent.   Original Report Authenticated By: Jolaine Click, M.D.    Anti-infectives: Anti-infectives   Start     Dose/Rate Route Frequency Ordered Stop   07/11/12 1200  vancomycin (VANCOCIN) 50 mg/mL oral solution 250 mg     250 mg Oral 4 times per day 07/11/12 1013     07/06/12 1800  vancomycin (VANCOCIN) 500 mg in sodium chloride irrigation 0.9 % 100 mL ENEMA  Status:  Discontinued     500 mg Rectal 4 times per day 07/06/12 1335 07/11/12 1244   07/06/12 1430  metroNIDAZOLE (FLAGYL) IVPB 500 mg  Status:  Discontinued     500 mg 100 mL/hr over 60 Minutes Intravenous 3 times per day 07/06/12 1335 07/14/12 0725   06/25/12 1000  vancomycin (VANCOCIN) 1,250 mg in sodium chloride 0.9 % 250 mL IVPB  Status:  Discontinued     1,250 mg 166.7 mL/hr over 90 Minutes Intravenous Every 12 hours 06/25/12  1610 06/28/12 1102   06/24/12 2000  fluconazole (DIFLUCAN) IVPB 200 mg     200 mg 100 mL/hr over 60 Minutes Intravenous Every 24 hours 06/24/12 1733 06/26/12 2322   06/23/12 0900  vancomycin (VANCOCIN) 1,250 mg in sodium chloride 0.9 % 250 mL IVPB     1,250 mg 166.7 mL/hr over 90 Minutes Intravenous Every 12 hours 06/23/12 0522 06/24/12 2231   06/23/12 0600  piperacillin-tazobactam (ZOSYN) IVPB 3.375 g  Status:  Discontinued     3.375 g 12.5 mL/hr over 240 Minutes Intravenous 3 times per day 06/23/12 0447 06/28/12 1102   06/23/12 0330   cefTRIAXone (ROCEPHIN) 1 g in dextrose 5 % 50 mL IVPB     1 g 100 mL/hr over 30 Minutes Intravenous  Once 06/23/12 0319 06/23/12 0440      Assessment/Plan: s/p pelvic hematoma drainage 7/14; cont current tx; will check fluid cytology, recheck cx's; once output < 10-15 cc's daily excluding flushes obtain f/u CT to evaluate progress  LOS: 25 days    Kelson Queenan,D [facility] 07/17/2012

## 2012-07-17 NOTE — Progress Notes (Signed)
Sore at drain site Tol liquids/pureed - adv to low residue diet D/c skin staples Continue drain to decompress pelvic hematoma F/u Cx - neg so far

## 2012-07-17 NOTE — Progress Notes (Signed)
23 staples removed from patient's midline abdominal incision per MD order.  Patient tolerated well.  Site clean and dry.  Will continue to monitor.

## 2012-07-17 NOTE — Progress Notes (Signed)
CSW met with family and provided information on advanced directives. CSW to follow for completion of advanced directive and continued emotional support.  Eleisha Branscomb C. Shantanique Hodo MSW, LCSW [phone]

## 2012-07-17 NOTE — Progress Notes (Signed)
Patient ID: Chad Olson, male   DOB: [birth year], 67 y.o.   MRN: [id number] 5 Days Post-Op  Subjective: Pain in buttocks where drain is located, no other complaints  Objective: Vital signs in last 24 hours: Temp:  [97.7 F (36.5 C)-98.1 F (36.7 C)] 97.8 F (36.6 C) (07/16 0435) Pulse Rate:  [78-86] 86 (07/16 0435) Resp:  [18] 18 (07/16 0435) BP: (122-127)/(61-72) 122/61 mmHg (07/16 0435) SpO2:  [98 %-100 %] 99 % (07/16 0435) Last BM Date: 07/16/12  Intake/Output from previous day: 07/15 0701 - 07/16 0700 In: 3344.2 [P.O.:720; I.V.:1569.2] Out: 4410 [Urine:4350; Drains:60] Intake/Output this shift:    PE: Gen:  Alert, NAD, pleasant Abd: Soft, nontender,  +BS, midline lower staples still in place (per urology), drain with dark sanguinous drainage  Lab Results:   Recent Labs  07/15/12 0544 07/17/12 0519  WBC 7.7 7.9  HGB 12.9* 13.3  HCT 41.2 41.6  PLT 279 252   BMET  Recent Labs  07/16/12 0415 07/17/12 0519  NA 139 141  K 3.5 3.4*  CL 110 109  CO2 21 22  GLUCOSE 101* 112*  BUN 8 7  CREATININE 0.99 0.99  CALCIUM 8.9 9.1   PT/INR  Recent Labs  07/15/12 1450  LABPROT 13.9  INR 1.09   CMP     Component Value Date/Time   NA 141 07/17/2012 0519   NA 139 04/18/2012 0801   K 3.4* 07/17/2012 0519   K 3.9 04/18/2012 0801   CL 109 07/17/2012 0519   CL 105 04/18/2012 0801   CO2 22 07/17/2012 0519   CO2 22 04/18/2012 0801   GLUCOSE 112* 07/17/2012 0519   GLUCOSE 121* 04/18/2012 0801   BUN 7 07/17/2012 0519   BUN 9.1 04/18/2012 0801   CREATININE 0.99 07/17/2012 0519   CREATININE 1.1 04/18/2012 0801   CALCIUM 9.1 07/17/2012 0519   CALCIUM 9.3 04/18/2012 0801   PROT 6.2 07/11/2012 0410   PROT 7.3 04/18/2012 0801   ALBUMIN 2.7* 07/11/2012 0410   ALBUMIN 2.7* 04/18/2012 0801   AST 15 07/11/2012 0410   AST 16 04/18/2012 0801   ALT 15 07/11/2012 0410   ALT 22 04/18/2012 0801   ALKPHOS 63 07/11/2012 0410   ALKPHOS 82 04/18/2012 0801   BILITOT 0.3 07/11/2012 0410   BILITOT  0.46 04/18/2012 0801   GFRNONAA 83* 07/17/2012 0519   GFRAA >90 07/17/2012 0519   Lipase     Component Value Date/Time   LIPASE 25 05/13/2012 1650       Studies/Results: Ct Guided Abscess Drain  07/15/2012   *RADIOLOGY REPORT*  Clinical Data/Indication: THE COLONIC OBSTRUCTION SECONDARY TO A PELVIC FLUID COLLECTION.  CT GUIDED ABCESS DRAINAGE WITH CATHETER  Sedation: Versed 2.0 mg, Fentanyl 100 mg.  Total Moderate Sedation Time: 15 minutes.  Procedure: The procedure, risks, benefits, and alternatives were explained to the patient. Questions regarding the procedure were encouraged and answered. The patient understands and consents to the procedure.  Time-out procedure was performed.  The gluteal region in the prone position was prepped with betadine in a sterile fashion, and a sterile drape was applied covering the operative field. A surgical mask and sterile gloves were used for the procedure.  Under CT guidance, an 18 gauge needle was inserted into the pelvic fluid collection via left transgluteal approach and removed over an Amplatz.  A 12-French dilator followed by a 12-French drain were inserted.  It was looped in the fluid collection and string fixed and sewn to the skin.  Dark bloody fluid was aspirated.  Findings: Imaging demonstrates placement of a left 12-French transgluteal pelvic fluid collection drain.  Complications: None.  IMPRESSION: Successful left transgluteal pelvic drainage placement into a liquefying hematoma.  Culture was sent.   Original Report Authenticated By: Jolaine Click, M.D.    Anti-infectives: Anti-infectives   Start     Dose/Rate Route Frequency Ordered Stop   07/11/12 1200  vancomycin (VANCOCIN) 50 mg/mL oral solution 250 mg     250 mg Oral 4 times per day 07/11/12 1013     07/06/12 1800  vancomycin (VANCOCIN) 500 mg in sodium chloride irrigation 0.9 % 100 mL ENEMA  Status:  Discontinued     500 mg Rectal 4 times per day 07/06/12 1335 07/11/12 1244   07/06/12 1430   metroNIDAZOLE (FLAGYL) IVPB 500 mg  Status:  Discontinued     500 mg 100 mL/hr over 60 Minutes Intravenous 3 times per day 07/06/12 1335 07/14/12 0725   06/25/12 1000  vancomycin (VANCOCIN) 1,250 mg in sodium chloride 0.9 % 250 mL IVPB  Status:  Discontinued     1,250 mg 166.7 mL/hr over 90 Minutes Intravenous Every 12 hours 06/25/12 0856 06/28/12 1102   06/24/12 2000  fluconazole (DIFLUCAN) IVPB 200 mg     200 mg 100 mL/hr over 60 Minutes Intravenous Every 24 hours 06/24/12 1733 06/26/12 2322   06/23/12 0900  vancomycin (VANCOCIN) 1,250 mg in sodium chloride 0.9 % 250 mL IVPB     1,250 mg 166.7 mL/hr over 90 Minutes Intravenous Every 12 hours 06/23/12 0522 06/24/12 2231   06/23/12 0600  piperacillin-tazobactam (ZOSYN) IVPB 3.375 g  Status:  Discontinued     3.375 g 12.5 mL/hr over 240 Minutes Intravenous 3 times per day 06/23/12 0447 06/28/12 1102   06/23/12 0330  cefTRIAXone (ROCEPHIN) 1 g in dextrose 5 % 50 mL IVPB     1 g 100 mL/hr over 30 Minutes Intravenous  Once 06/23/12 0319 06/23/12 0440       Assessment/Plan Rectal obstruction from 13cm pelvic hematoma s/p multiple urologic surgeries, currently with rectal tube  Bladder cancer  C. 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  IR drain in place, 60cc/24hrs, drain care 2.  Would continue conservatively for now (rectal tube and drain) 3.  If unsuccessful continue drain and think about rectal stent 4.  Last resort would take to surgery 5.  Ambulate and IS 6.  SCD's and heparin   LOS: 25 days    Elynn Patteson 07/17/2012, 8:36 AM

## 2012-07-17 NOTE — Progress Notes (Signed)
[facility] Progress Note  Subjective:  Very sore on buttocks where drain is located.  Just over 200 cc of fluid drained from hematoma so far.  Objective:  Vital signs in last 24 hours: Temp:  [97.7 F (36.5 C)-98.1 F (36.7 C)] 97.8 F (36.6 C) (07/16 0435) Pulse Rate:  [78-86] 86 (07/16 0435) Resp:  [18] 18 (07/16 0435) BP: (122-127)/(61-72) 122/61 mmHg (07/16 0435) SpO2:  [98 %-100 %] 99 % (07/16 0435) Last BM Date: 07/16/12 General:   Alert, Well-developed, in NAD Heart:  Regular rate and rhythm; no murmurs Pulm:  CTAB.  No W/R/R. Abdomen:  Soft, non-distended.  BS present.  Mild diffuse TTP without R/R/G. Extremities:  Without edema. Neurologic:  Alert and  oriented x4;  grossly normal neurologically. Psych:  Alert and cooperative. Normal mood and affect.  Intake/Output from previous day: 07/15 0701 - 07/16 0700 In: 3344.2 [P.O.:720; I.V.:1569.2] Out: 4410 [Urine:4350; Drains:60] Intake/Output this shift: Total I/O In: -  Out: 35 [Drains:35]  Lab Results:  Recent Labs  07/15/12 0544 07/17/12 0519  WBC 7.7 7.9  HGB 12.9* 13.3  HCT 41.2 41.6  PLT 279 252   BMET  Recent Labs  07/15/12 0544 07/16/12 0415 07/17/12 0519  NA 141 139 141  K 3.1* 3.5 3.4*  CL 110 110 109  CO2 21 21 22   GLUCOSE 109* 101* 112*  BUN 10 8 7   CREATININE 1.03 0.99 0.99  CALCIUM 8.9 8.9 9.1   PT/INR  Recent Labs  07/15/12 1450  LABPROT 13.9  INR 1.09   Ct Guided Abscess Drain  07/15/2012   *RADIOLOGY REPORT*  Clinical Data/Indication: THE COLONIC OBSTRUCTION SECONDARY TO A PELVIC FLUID COLLECTION.  CT GUIDED ABCESS DRAINAGE WITH CATHETER  Sedation: Versed 2.0 mg, Fentanyl 100 mg.  Total Moderate Sedation Time: 15 minutes.  Procedure: The procedure, risks, benefits, and alternatives were explained to the patient. Questions regarding the procedure were encouraged and answered. The patient understands and consents to the procedure.  Time-out procedure was performed.   The gluteal region in the prone position was prepped with betadine in a sterile fashion, and a sterile drape was applied covering the operative field. A surgical mask and sterile gloves were used for the procedure.  Under CT guidance, an 18 gauge needle was inserted into the pelvic fluid collection via left transgluteal approach and removed over an Amplatz.  A 12-French dilator followed by a 12-French drain were inserted.  It was looped in the fluid collection and string fixed and sewn to the skin. Dark bloody fluid was aspirated.  Findings: Imaging demonstrates placement of a left 12-French transgluteal pelvic fluid collection drain.  Complications: None.  IMPRESSION: Successful left transgluteal pelvic drainage placement into a liquefying hematoma.  Culture was sent.   Original Report Authenticated By: Jolaine Click, M.D.    Assessment / Plan: 67 y.o. male with rectal obstruction/ischemia from 13cm pelvic hematoma, deconditioning, Cdiff infection, bladder cancer s/p several urinary surgeries/complications.   Continue po vanc another 2 days for the Cdiff, then ok to discontinue it. S/p flexible sigmoidoscopy with 32 Fr red rubber rectal tube placed for decompression, which is working well. S/p transgluteal drain placed into the liquifying hematoma by IR with just over 200 cc out so far. Leave rectal tube in place for now. ? Need to re-image the pelvis in the near future to determine progress.     LOS: 25 days   ZEHR, JESSICA D.  07/17/2012, 10:04 AM  Pager number [phone]  I have  personally taken an interval history, reviewed the chart, and examined the patient. He is actually having some stool around the rectal tube.  Should this continue and increase I think we can probably DC the tube since this probably indicates that the lumen of the colon is opening up.  Will reevaluate in a.m.  Barbette Hair. Arlyce Dice, MD, [facility] [phone]

## 2012-07-17 NOTE — Progress Notes (Signed)
NUTRITION FOLLOW UP  Intervention:   Continue Ensure Complete TID Provide Resource Breeze once daily Provide Ensure Pudding once daily Provide "Low Fiber Nutrition Therapy" handout from [employer]  Nutrition Dx:   Inadequate oral intake related to diet order as evidenced by clear liquid diet; ongoing, diet advanced but, still inadequate PO intake  New goal: Pt to meet >/= 90% of their estimated nutrition needs; not met  Monitor:   Weights; 216 lbs per bed scale 7/16 - 6 lb wt loss since 6/22 Labs; low potassium TPN; discontinued Diet advancement; low fiber BM; 7/15 diarrhea  Assessment:   Pt's diet advanced to low fiber. Per nursing notes pt ate 100% of 2 meals today but, pt only consumed liquids. Pt reports that he ate mashed potatoes with gravy for lunch but, the rest was liquids only. Pt states he only drank one Ensure supplement today but, he is willing to drink 3 daily. Pt worried about eating solid food now that he has rectal tube in. Gave pt "Low Fiber Nutrition Therapy" from the Academy of [employer] Diet[employer] and reviewed high fiber foods to avoid. Provided list of recommend foods for pt to consume.  Height: Ht Readings from Last 1 Encounters:  06/23/12 5\' 11"  (1.803 m)    Weight Status:   Wt Readings from Last 1 Encounters:  06/23/12 220 lb 11.2 oz (100.109 kg)    Re-estimated needs:  Kcal: 1950-2100  Protein: 95-120g  Fluid: 1.9-2.1L/day   Skin: Mid abdominal incision, right buttocks abrasion, back left skin tear, left buttocks incision   Diet Order: Fiber Restricted   Intake/Output Summary (Last 24 hours) at 07/17/12 1624 Last data filed at 07/17/12 1500  Gross per 24 hour  Intake 4387.5 ml  Output   4845 ml  Net -457.5 ml    Last BM: 7/15 diarrhea   Labs:   Recent Labs Lab 07/11/12 0410  07/15/12 0544 07/16/12 0415 07/17/12 0519  NA 139  < > 141 139 141  K 3.9  < > 3.1* 3.5 3.4*  CL 104  < > 110 110 109  CO2  27  < > 21 21 22   BUN 15  < > 10 8 7   CREATININE 0.83  < > 1.03 0.99 0.99  CALCIUM 8.8  < > 8.9 8.9 9.1  MG 2.2  --   --  2.0  --   PHOS 3.9  --   --   --   --   GLUCOSE 135*  < > 109* 101* 112*  < > = values in this interval not displayed.  CBG (last 3)   Recent Labs  07/15/12 2014 07/15/12 2346 07/16/12 0419  GLUCAP 123* 105* 92    Scheduled Meds: . [START ON 07/18/2012] famotidine  20 mg Oral Daily  . feeding supplement  237 mL Oral TID WC  . heparin subcutaneous  5,000 Units Subcutaneous Q8H  . hydrocortisone   Rectal TID  . metoCLOPramide (REGLAN) injection  10 mg Intravenous TID AC & HS  . metoprolol  5 mg Intravenous Q6H  . ondansetron  4 mg Intravenous Q4H  . potassium chloride  40 mEq Oral TID  . potassium chloride  40 mEq Oral Once  . vancomycin  250 mg Oral Q6H    Continuous Infusions: . dextrose 5 % and 0.45 % NaCl with KCl 20 mEq/L 50 mL/hr at 07/16/12 1313   Ian Malkin RD, LDN Inpatient Clinical Dietitian Pager: [phone] After Hours Pager: [phone]

## 2012-07-17 NOTE — Progress Notes (Signed)
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 Healed well initially and sent home 6/17. Loopogram 6/27 without internal leak.  2 - Fever / h/o Bacteruria - Pt with MRSA in urine by hospital UCX 03/2012 and again subsequently in blood. Now on Vancomycin daily per ID x 4-6 week total (nearing end of course). Most recent UCX, BCX negative, New set pending from 6/22. Pt readmitted with low-grade fever 6/22 to 100.7 and malaise, no sig leukocytosis. Now on Vanc + Zosyn emirically. UCX with yeast (trated diflucan x 3 days), BCX no growth to date.Pelvic fluid aspirate and no growth / final.  ID now following,and DC'd all ABX given eval except for current C. Diff treatment.  3 - Heme / Recent DVT + PE -incidental PE by CT 03/2012 now on Lovenox, DVT resolved by most recent LE duplex. Repeat CT-PE 6/23 without PE or active bleed from pelvis and Lovenox DC'd. Hgb drift to 7 6/24 and transfused 2 u with response to 8, transfused 2u additional 6/25 with increase to 10. Hematology consult [name]) also agrees no treatment dose anticoagulation at this point, but resume proph dosing since he is not ambulating much.  4 - Pelvic Fluid Collection - Pelvic fluid collection without rim-enhancement noted on CT from ER 6/22. Appears partially simple fluid with some dependent blood by HU analysis, no active extrav of blood or urine by imaging. IR aspiration performed 6/26 and fluid c/w hematoma only (not purulent, not simple/drainable). Repeat aspiration with insertion of 1F pigtail performed 7/14 at which time calculated volume using ellipsoid approximation  . Output 150 mL 7/14, 60 mL 7/15.  5 - Hemorrhoids / Lower GI Bleed - Long h/o hemorroids. Pt with worsened / friable bleeding hemorroids on admission that are quite bothersome. No prior specific intervention, likely exacerbated by pelvic fluid collection / hematoma. Gen Surg recs conservative measures and now nearly resolved clinically.  6 - Nutrition / Nausea / C. Difficile- Pt with some element of baseline GI issues with GERD and prior small esophageal polyp. Has had exacerbation of nausea in house and not meeting nutritional goals. GI involved and initial EGD + KUB with ileus / gastroparesis picture. While ileus resolving placed on TPN and NGT for symptom relief and to bridge him with nutrition which was resumed 7/11 following successful decompression with rectal tube place with aide of sigmoidoscopy. C. Diff found positive 7/5 and started on dual therapy with flagyl + vanc enemas with plan for 14 day course which is ending soon.   7 - Disposition - PT eval has recommended DC to SNF when appropriate and family amenable.  PMH sig for obesity and left knee replacement, PE + DVT (now resolved). No CV disease.   Today Chad continues to tollerate PO diet without nausea. He had some BM and gas around rectal tube overnight, but with majority through tube.  Objective: Vital signs in last 24 hours: Temp:  [97.7 F (36.5 C)-98.1 F (36.7 C)] 97.8 F (36.6 C) (07/16 0435) Pulse Rate:  [78-86] 86 (07/16 0435) Resp:  [18] 18 (07/16 0435) BP: (122-127)/(61-72) 122/61 mmHg (07/16 0435) SpO2:  [98 %-100 %] 99 % (07/16 0435) Last BM Date: 07/16/12  Intake/Output from previous day: 07/15 0701 - 07/16 0700 In: 3344.2 [  P.O.:720; I.V.:1569.2] Out: 4410 [Urine:4350; Drains:60] Intake/Output this shift: Total I/O In: -  Out: 35 [Drains:35]  General appearance: alert, cooperative and appears stated age Head: Normocephalic, without obvious abnormality, atraumatic Eyes: conjunctivae/corneas  clear. PERRL, EOM's intact. Fundi benign. Ears: normal TM's and external ear canals both ears Nose: Nares normal. Septum midline. Mucosa normal. No drainage or sinus tenderness. Throat: lips, mucosa, and tongue normal; teeth and gums normal Neck: no adenopathy, no carotid bruit, no JVD, supple, symmetrical, trachea midline and thyroid not enlarged, symmetric, no tenderness/mass/nodules Back: symmetric, no curvature. ROM normal. No CVA tenderness. Resp: clear to auscultation bilaterally Chest wall: no tenderness Cardio: regular rate and rhythm, S1, S2 normal, no murmur, click, rub or gallop GI: soft, non-tender; bowel sounds normal; no masses,  no organomegaly and abd withotu typanic gas or distension whatsoever. Male genitalia: normal Extremities: extremities normal, atraumatic, no cyanosis or edema and RUE PICC c/d/i. No erythema. Pulses: 2+ and symmetric Skin: Skin color, texture, turgor normal. No rashes or lesions Lymph nodes: Cervical, supraclavicular, and axillary nodes normal. Neurologic: Grossly normal Incision/Wound: Midline incision c/d/i. RLQ Urosotmy pink / patent with Bander stents. REctal tueb in place with some stool around btu large liquid stool in collection bag. Left Buttocks hematoma tube with dark bloody fluid. 30cc syringe used to aspirate and "percuss" with immedate withdrawal of additional 5cc which was added to I/O count per nursing.   Lab Results:   Recent Labs  07/15/12 0544 07/17/12 0519  WBC 7.7 7.9  HGB 12.9* 13.3  HCT 41.2 41.6  PLT 279 252   BMET  Recent Labs  07/16/12 0415 07/17/12 0519  NA 139 141  K 3.5 3.4*  CL 110 109  CO2 21 22  GLUCOSE 101* 112*  BUN 8 7  CREATININE 0.99 0.99  CALCIUM 8.9 9.1   PT/INR  Recent Labs  07/15/12 1450  LABPROT 13.9  INR 1.09   ABG No results found for this basename: PHART, PCO2, PO2, HCO3,  in the last 72 hours  Studies/Results: Ct Guided Abscess Drain  07/15/2012   *RADIOLOGY REPORT*  Clinical  Data/Indication: THE COLONIC OBSTRUCTION SECONDARY TO A PELVIC FLUID COLLECTION.  CT GUIDED ABCESS DRAINAGE WITH CATHETER  Sedation: Versed 2.0 mg, Fentanyl 100 mg.  Total Moderate Sedation Time: 15 minutes.  Procedure: The procedure, risks, benefits, and alternatives were explained to the patient. Questions regarding the procedure were encouraged and answered. The patient understands and consents to the procedure.  Time-out procedure was performed.  The gluteal region in the prone position was prepped with betadine in a sterile fashion, and a sterile drape was applied covering the operative field. A surgical mask and sterile gloves were used for the procedure.  Under CT guidance, an 18 gauge needle was inserted into the pelvic fluid collection via left transgluteal approach and removed over an Amplatz.  A 12-French dilator followed by a 12-French drain were inserted.  It was looped in the fluid collection and string fixed and sewn to the skin. Dark bloody fluid was aspirated.  Findings: Imaging demonstrates placement of a left 12-French transgluteal pelvic fluid collection drain.  Complications: None.  IMPRESSION: Successful left transgluteal pelvic drainage placement into a liquefying hematoma.  Culture was sent.   Original Report Authenticated By: Jolaine Click, M.D.    Anti-infectives: Anti-infectives   Start     Dose/Rate Route Frequency Ordered Stop   07/11/12 1200  vancomycin (VANCOCIN) 50 mg/mL oral solution 250 mg     250 mg Oral 4  times per day 07/11/12 1013     07/06/12 1800  vancomycin (VANCOCIN) 500 mg in sodium chloride irrigation 0.9 % 100 mL ENEMA  Status:  Discontinued     500 mg Rectal 4 times per day 07/06/12 1335 07/11/12 1244   07/06/12 1430  metroNIDAZOLE (FLAGYL) IVPB 500 mg  Status:  Discontinued     500 mg 100 mL/hr over 60 Minutes Intravenous 3 times per day 07/06/12 1335 07/14/12 0725   06/25/12 1000  vancomycin (VANCOCIN) 1,250 mg in sodium chloride 0.9 % 250 mL IVPB  Status:   Discontinued     1,250 mg 166.7 mL/hr over 90 Minutes Intravenous Every 12 hours 06/25/12 0856 06/28/12 1102   06/24/12 2000  fluconazole (DIFLUCAN) IVPB 200 mg     200 mg 100 mL/hr over 60 Minutes Intravenous Every 24 hours 06/24/12 1733 06/26/12 2322   06/23/12 0900  vancomycin (VANCOCIN) 1,250 mg in sodium chloride 0.9 % 250 mL IVPB     1,250 mg 166.7 mL/hr over 90 Minutes Intravenous Every 12 hours 06/23/12 0522 06/24/12 2231   06/23/12 0600  piperacillin-tazobactam (ZOSYN) IVPB 3.375 g  Status:  Discontinued     3.375 g 12.5 mL/hr over 240 Minutes Intravenous 3 times per day 06/23/12 0447 06/28/12 1102   06/23/12 0330  cefTRIAXone (ROCEPHIN) 1 g in dextrose 5 % 50 mL IVPB     1 g 100 mL/hr over 30 Minutes Intravenous  Once 06/23/12 0319 06/23/12 0440      Assessment/Plan:  1 - Bladder Cancer / Cystectomy / Ureteral Revision- NED form cancer perspective.Loopogram without leak this admission. WIll consider staple removal and left neph tube removal under fluoro later this admission when back to baseline in terms of GI status.  2 - Fever / h/o Bacteruria - Now off wound-specific ABX given negative CX from blood, urine, pelvic fluid.  3 - Heme / Recent DVT + PE -Lovenox now stopped. SCD's in place.  . PT following. Remain proph SQ heparin + SCD's. He is now making much more progress in terms of ambulation and PT.  4 - Pelvic Fluid Collection - Imaging and aspirate c/w non-infected hemaotma. Size stable / decreasing as expected. Eplained to pt this will resolve, but slowly (weeks-mos). To help expedite resolution IR drain placed 7/14, optimistic this will be able to aspirate a meaningful amt of hematoma to decrease colonic compression, Estimate would need to aspirate plus to achieve 50% reduction in volume based on volume estimate, now with just over 200 out so far.  5 - Hemorrhoids / Lower GI Bleed - Agree with conservative measures for now. Hgb stable.  6 - Nutrition / Nausea /  C. Difficile- Remains central active issue. Agree with aggressive treatment for C. Diff and now nearing end of course. Clearly feels better with rectal tube. Further options including rectal tube / low residue diet v. Diverting colostomy with or withotu pelvic hematoma evacuation v. Transcolonic hematoma evaculation discussed. Will keep with current plan of rectal tube now augmented by hematoma drain to help expedite resolution. Would not be eager to remove rectal tube or re-image until at least several hundred mL hematoma fluid evacuated, pt and family has very good understanding of overall plan in this regard.  7 - Disposition - SNF pending nutritional status and ileus resolution.  Greatly appreciate hospitalist, gen surgery, ID, hematology,PT, GI  input.  [facility], Yeshua Olson 07/17/2012

## 2012-07-18 ENCOUNTER — Telehealth: Payer: Self-pay | Admitting: *Deleted

## 2012-07-18 LAB — BASIC METABOLIC PANEL
BUN: 5 mg/dL — ABNORMAL LOW (ref 6–23)
CO2: 20 mEq/L (ref 19–32)
Chloride: 109 mEq/L (ref 96–112)
GFR calc Af Amer: >90 mL/min (ref >90)
Glucose, Bld: 110 mg/dL — ABNORMAL HIGH (ref 70–99)
Potassium: 3.7 mEq/L (ref 3.5–5.1)

## 2012-07-18 LAB — CULTURE, ROUTINE-ABSCESS
Culture: NO GROWTH
Gram Stain: NONE SEEN

## 2012-07-18 MED ORDER — HYDROMORPHONE HCL PF 1 MG/ML IJ SOLN
1.0000 mg | Freq: Once | INTRAMUSCULAR | Status: AC
  Administered 2012-07-18: 1 mg via INTRAVENOUS
  Filled 2012-07-18: qty 1

## 2012-07-18 NOTE — Progress Notes (Signed)
[facility] PROGRESS NOTE  RYIN AMBROSIUS ZOX:[id number] DOB: 04-May-1945 DOA: 06/22/2012 PCP: [name], MD    Assessment/Plan:  67 year old male with history of urothelial carcinoma status post robotic cystoprostatectomy in February this year with Intra- corporeal ileal conduit urinary diversion .  Patient also was admitted for MRSA bacteremia in May which was thought in the setting of chronic indwelling left nephrostomy tube and double-J stents. He finished treatment for MRSA bacteremia. He received Vancomycin for 6 weeks.  Patient was also diagnosed of lower extremity DVT and bilateral PE in March of this year and began treatment with therapeutic Lovenox. A CT of the abdomen and pelvis done this admission showed large pelvic fluid collection suggestive hematoma which cannot out early abscess. It was decide to stop anticoagulation. He received 3 Months treatment for PE.  Currently pelvic hematoma is stable and only receiving heparin for DVT prophylaxis.Marland Kitchen  Hospital course complicated by illeus,  C. Diff colitis, and rectal compression cause by hematoma.  Surgery recommend VIR transgluteal evacuation of hematoma. IR was consulted and patient had Transgluteal pelvic hematoma drain placed 7-14.    IIleus/ Hematoma causing rectal compression -Worsening abdominal distension 7-11. Dr Christella Hartigan perform Flex sigmoidoscopy 7-11 for rectal tube placement and decompression. Hematoma felt to be compressing rectum. -continue reglan -TNA DC by gastro 7-13. Started on ensure.  -Continue electrolytes repletion (Goal is K as close to 4 as possible) -continue tx for C. Diff for 2 more days per GI.  - IR was consulted and patient had Transgluteal pelvic hematoma drain placed 7-14. About 200 cc of fluid out since placement.  C. Diff colitis -will continue vancomycin for 1 more day (day 13). Received 8days of flagyl.   Pelvic hematoma:  Hematoma stable by CT scan.  Monitor hb on heparin.  -Repeated CT  abd to check for stability on 7/8 demonstrated no increase in size and no significant compromise with intestine. -Repeat CT 7-8 stable pelvic hematoma.  -Probably causing rectal compression. S/P sigmoidoscopy.   Anemia  Received a total of 4 units PRBC during this admission. Hb at 12. Stable.   Hypokalemia  Secondary to GI losses.  KCl 40 meq po TID.  Mg at 2.2.   Hypertension  Blood pressure stable. Will continue metoprolol IV  Hyperlipidemia  Continue holding statin for now and will resume when tolerating PO   hemorrhoids  -General surgery consulted and recommended sitz bath and ice packs for now.  -continue anusol rectal creams.   Fever with mild leucocytosis: Resolved.  Pelvic fluid collection Aspiration negative for bacteria.  Completed treatment with IV vancomycin for MRSA bacteremia. (Vancomycin stopped 6-27).  Urine cx growing yeast, fluconazole stopped after 3 days tx.  -now receiving treatment for c. Diff.   Hx of DVT and bilateral PE :  -repeated CT scan hematoma stable. HB stable at 11. Patient was started on prophylaxis dose heparin.  -Recent Doppler of his lower extremity was negative for DVT.  -Repeated a CT angiogram of the chest which was negative for PE.  -Patient received 3 Month treatment for PE.  -Appreciate Dr Cyndie Chime recommendation. No indication for IVC filter.  -Will continue heparin for DVT prophylaxis.  Bladder cancer: S/P surgery.  Treatment per primary team.  Loopogram negative for leak.   Procedure:  S/P endoscopy 7-1 show: The mucosa of the esophagus appeared normal; likely esophageal Spasm. Fluid/food residue in the gastric fundus, gastric body, and gastric antrum consistent with gastroparesis (felt, at least in part, to explain nausea)  Sigmoidoscopy: The pelvic hematoma is causing stricture in proximal rectum, distal  sigmoid. The mucosa at the site of narrowing is severely inflammed, friable appearing. A 32 Fr red rubber Rectal Tube  was  placed across the stricture site and placed to gravity drainage   Will sign off. Please call us back with questions.  Code Status: Full Family Communication: wife , sister at bedside.  Disposition Plan: to be determine.   Antibiotics:  Vancomycin enemas 07/06/12  IV flagyl 07/06/12  HPI/Subjective: Pain at site of rectal drain.    Objective: Filed Vitals:   07/17/12 2013 07/17/12 2257 07/18/12 0552 07/18/12 1352  BP: 123/81 124/78 127/87 110/74  Pulse: 78 82 80 75  Temp: 97.8 F (36.6 C) 97.9 F (36.6 C) 97.2 F (36.2 C) 97.6 F (36.4 C)  TempSrc: Oral Oral Oral Oral  Resp: 16 16 18 16   Height:      Weight:      SpO2: 99% 99% 99% 98%    Intake/Output Summary (Last 24 hours) at 07/18/12 1459 Last data filed at 07/18/12 1354  Gross per 24 hour  Intake 1098.33 ml  Output   3260 ml  Net -2161.67 ml   Filed Weights   06/23/12 0405 06/23/12 0449  Weight: 103.2 kg (227 lb 8.2 oz) 100.109 kg (220 lb 11.2 oz)    Exam:   General:  no distress.   Cardiovascular: S1 and S2, no rubs or gallops  Respiratory: CTA bilaterally  Abdomen: softer, less distended, urostomy bag in place, mid surgical wound w/o signs or infection;  BS present.  Musculoskeletal: trace edema bilaterally  Data Reviewed: Basic Metabolic Panel:  Recent Labs Lab 07/14/12 0828 07/15/12 0544 07/16/12 0415 07/17/12 0519 07/18/12 0415  NA 139 141 139 141 139  K 3.4* 3.1* 3.5 3.4* 3.7  CL 108 110 110 109 109  CO2 22 21 21 22 20   GLUCOSE 129* 109* 101* 112* 110*  BUN 12 10 8 7  5*  CREATININE 0.87 1.03 0.99 0.99 0.98  CALCIUM 8.6 8.9 8.9 9.1 9.2  MG  --   --  2.0  --   --    CBC:  Recent Labs Lab 07/13/12 0455 07/15/12 0544 07/17/12 0519  WBC 7.8 7.7 7.9  HGB 12.9* 12.9* 13.3  HCT 40.7 41.2 41.6  MCV 94.4 95.2 93.7  PLT 283 279 252   CBG:  Recent Labs Lab 07/15/12 1213 07/15/12 1645 07/15/12 2014 07/15/12 2346 07/16/12 0419  GLUCAP 125* 94 123* 105* 92    Recent  Results (from the past 240 hour(s))  CULTURE, ROUTINE-ABSCESS     Status: None   Collection Time    07/15/12  4:55 PM      Result Value Range Status   Specimen Description PERITONEAL CAVITY   Final   Special Requests NONE   Final   Gram Stain     Final   Value: NO WBC SEEN     NO SQUAMOUS EPITHELIAL CELLS SEEN     NO ORGANISMS SEEN   Culture NO GROWTH 3 DAYS   Final   Report Status 07/18/2012 FINAL   Final     Studies: No results found.  Scheduled Meds: . famotidine  20 mg Oral Daily  . feeding supplement  237 mL Oral TID WC  . feeding supplement  1 Container Oral Q24H  . feeding supplement  1 Container Oral Q24H  . heparin subcutaneous  5,000 Units Subcutaneous Q8H  . hydrocortisone   Rectal TID  .  metoCLOPramide (REGLAN) injection  10 mg Intravenous TID AC & HS  . metoprolol  5 mg Intravenous Q6H  . ondansetron  4 mg Intravenous Q4H  . potassium chloride  40 mEq Oral TID  . vancomycin  250 mg Oral Q6H   Continuous Infusions: . dextrose 5 % and 0.45 % NaCl with KCl 20 mEq/L 50 mL/hr at 07/17/12 2307    Principal Problem:   Pelvic hematoma, male Active Problems:   Bladder cancer s/p robotic prostatecystectomy [phone]   Acute pulmonary embolism (3/14)   DVT (deep venous thrombosis) 3/14   Fever   Bacteremia MRSA 5/14   VTE (venous thromboembolism)   Protein-calorie malnutrition, severe   Acute blood loss anemia   Gastroparesis   Colonic obstruction   Hypokalemia    Time spent: < 25 minutes   HERNANDEZ ACOSTA,ESTELA  [facility] Pager [phone]. If 7PM-7AM, please contact night-coverage at [email or web address], password [facility] 07/18/2012, 2:59 PM  LOS: 26 days

## 2012-07-18 NOTE — Progress Notes (Signed)
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 Healed well initially and sent home 6/17. Loopogram 6/27 without internal leak.  2 - Fever / h/o Bacteruria - Pt with MRSA in urine by hospital UCX 03/2012 and again subsequently in blood. Now on Vancomycin daily per ID x 4-6 week total (nearing end of course). Most recent UCX, BCX negative, New set pending from 6/22. Pt readmitted with low-grade fever 6/22 to 100.7 and malaise, no sig leukocytosis. Now on Vanc + Zosyn emirically. UCX with yeast (trated diflucan x 3 days), BCX no growth to date.Pelvic fluid aspirate and no growth / final.  ID now following,and DC'd all ABX given eval except for current C. Diff treatment.  3 - Heme / Recent DVT + PE -incidental PE by CT 03/2012 now on Lovenox, DVT resolved by most recent LE duplex. Repeat CT-PE 6/23 without PE or active bleed from pelvis and Lovenox DC'd. Hgb drift to 7 6/24 and transfused 2 u with response to 8, transfused 2u additional 6/25 with increase to 10. Hematology consult [name]) also agrees no treatment dose anticoagulation at this point, but resume proph dosing since he is not ambulating much.  4 - Pelvic Fluid Collection - Pelvic fluid collection without rim-enhancement noted on CT from ER 6/22. Appears partially simple fluid with some dependent blood by HU analysis, no active extrav of blood or urine by imaging. IR aspiration performed 6/26 and fluid c/w hematoma only (not purulent, not simple/drainable). Repeat aspiration with insertion of 18F pigtail performed 7/14 at which time calculated volume using ellipsoid approximation  . Output 150 mL 7/14, 60 mL 7/15, 7/16.  5 - Hemorrhoids / Lower GI Bleed - Long h/o hemorroids. Pt with worsened / friable bleeding hemorroids on admission that are quite bothersome. No prior specific intervention, likely exacerbated by pelvic fluid collection / hematoma. Gen Surg recs conservative measures and now nearly resolved clinically.  6 - Nutrition / Nausea / C. Difficile- Pt with some element of baseline GI issues with GERD and prior small esophageal polyp. Has had exacerbation of nausea in house and not meeting nutritional goals. GI involved and initial EGD + KUB with ileus / gastroparesis picture. While ileus resolving placed on TPN and NGT for symptom relief and to bridge him with nutrition which was resumed 7/11 following successful decompression with rectal tube place with aide of sigmoidoscopy. C. Diff found positive 7/5 and started on dual therapy with flagyl + vanc enemas with plan for 14 day course which is ending soon.   7 - Disposition - PT eval has recommended DC to SNF when appropriate and family amenable.  PMH sig for obesity and left knee replacement, PE + DVT (now resolved). No CV disease.   Today Yahmir continues to tollerate PO diet without nausea. He continues to have output via hematoma drain and stool around rectal tube.  Objective: Vital signs in last 24 hours: Temp:  [97.2 F (36.2 C)-97.9 F (36.6 C)] 97.2 F (36.2 C) (07/17 0552) Pulse Rate:  [78-90] 80 (07/17 0552) Resp:  [16-18] 18 (07/17 0552) BP: (123-130)/(78-87) 127/87 mmHg (07/17 0552) SpO2:  [99 %-100 %] 99 % (07/17 0552) Last BM Date: 07/18/12  Intake/Output from previous day: 07/16 0701 - 07/17 0700 In: 2248.3 [  P.O.:960; I.V.:858.3] Out: 3600 [Urine:2925; Drains:100; Stool:575] Intake/Output this shift:    General appearance: alert, cooperative and appears stated age Head: Normocephalic, without obvious abnormality, atraumatic Eyes: conjunctivae/corneas clear. PERRL, EOM's  intact. Fundi benign. Ears: normal TM's and external ear canals both ears Nose: Nares normal. Septum midline. Mucosa normal. No drainage or sinus tenderness. Throat: lips, mucosa, and tongue normal; teeth and gums normal Neck: no adenopathy, no carotid bruit, no JVD, supple, symmetrical, trachea midline and thyroid not enlarged, symmetric, no tenderness/mass/nodules Back: symmetric, no curvature. ROM normal. No CVA tenderness. Resp: clear to auscultation bilaterally Chest wall: no tenderness Cardio: regular rate and rhythm, S1, S2 normal, no murmur, click, rub or gallop GI: soft, non-tender; bowel sounds normal; no masses,  no organomegaly Male genitalia: normal Extremities: extremities normal, atraumatic, no cyanosis or edema and RUE PICC c/d/i. Pulses: 2+ and symmetric Skin: Skin color, texture, turgor normal. No rashes or lesions Lymph nodes: Cervical, supraclavicular, and axillary nodes normal. Neurologic: Grossly normal Incision/Wound: Prior midline incision c/d/i, staples now out. RLQ Urostomy continues pink / patent with bander stents. Hematoma drain with dark old blood (aspirated / percussed with efflux 3cc more). Rectal tube in postiion.   Lab Results:   Recent Labs  07/17/12 0519  WBC 7.9  HGB 13.3  HCT 41.6  PLT 252   BMET  Recent Labs  07/17/12 0519 07/18/12 0415  NA 141 139  K 3.4* 3.7  CL 109 109  CO2 22 20  GLUCOSE 112* 110*  BUN 7 5*  CREATININE 0.99 0.98  CALCIUM 9.1 9.2   PT/INR  Recent Labs  07/15/12 1450  LABPROT 13.9  INR 1.09   ABG No results found for this basename: PHART, PCO2, PO2, HCO3,  in the last 72 hours  Studies/Results: No results found.  Anti-infectives: Anti-infectives   Start     Dose/Rate Route Frequency Ordered Stop   07/11/12 1200  vancomycin (VANCOCIN) 50 mg/mL oral solution 250 mg     250 mg Oral 4 times per day 07/11/12 1013     07/06/12 1800  vancomycin (VANCOCIN) 500 mg in sodium chloride irrigation 0.9 % 100 mL  ENEMA  Status:  Discontinued     500 mg Rectal 4 times per day 07/06/12 1335 07/11/12 1244   07/06/12 1430  metroNIDAZOLE (FLAGYL) IVPB 500 mg  Status:  Discontinued     500 mg 100 mL/hr over 60 Minutes Intravenous 3 times per day 07/06/12 1335 07/14/12 0725   06/25/12 1000  vancomycin (VANCOCIN) 1,250 mg in sodium chloride 0.9 % 250 mL IVPB  Status:  Discontinued     1,250 mg 166.7 mL/hr over 90 Minutes Intravenous Every 12 hours 06/25/12 0856 06/28/12 1102   06/24/12 2000  fluconazole (DIFLUCAN) IVPB 200 mg     200 mg 100 mL/hr over 60 Minutes Intravenous Every 24 hours 06/24/12 1733 06/26/12 2322   06/23/12 0900  vancomycin (VANCOCIN) 1,250 mg in sodium chloride 0.9 % 250 mL IVPB     1,250 mg 166.7 mL/hr over 90 Minutes Intravenous Every 12 hours 06/23/12 0522 06/24/12 2231   06/23/12 0600  piperacillin-tazobactam (ZOSYN) IVPB 3.375 g  Status:  Discontinued     3.375 g 12.5 mL/hr over 240 Minutes Intravenous 3 times per day 06/23/12 0447 06/28/12 1102   06/23/12 0330  cefTRIAXone (ROCEPHIN) 1 g in dextrose 5 % 50 mL IVPB     1 g 100 mL/hr over 30 Minutes Intravenous  Once 06/23/12 0319 06/23/12 0440  Assessment/Plan:  1 - Bladder Cancer / Cystectomy / Ureteral Revision- NED form cancer perspective.Loopogram without leak this admission. WIll consider staple removal and left neph tube removal under fluoro later this admission when back to baseline in terms of GI status.  2 - Fever / h/o Bacteruria - Now off wound-specific ABX given negative CX from blood, urine, pelvic fluid.  3 - Heme / Recent DVT + PE -Lovenox now stopped. SCD's in place.  . PT following. Remain proph SQ heparin + SCD's. He is now making much more progress in terms of ambulation and PT.  4 - Pelvic Fluid Collection - Imaging and aspirate c/w non-infected hemaotma. Size stable / decreasing as expected. Eplained to pt this will resolve, but slowly (weeks-mos). To help expedite resolution IR drain placed 7/14,  optimistic this will be able to aspirate a meaningful amt of hematoma to decrease colonic compression, Estimate would need to aspirate plus to achieve 50% reduction in volume based on volume estimate, now with just over 300 out so far.  5 - Hemorrhoids / Lower GI Bleed - Agree with conservative measures for now. Hgb stable.  6 - Nutrition / Nausea / C. Difficile- Remains central active issue. Agree with aggressive treatment for C. Diff and now nearing end of course. Clearly feels better with rectal tube. Further options including rectal tube / low residue diet v. Diverting colostomy with or withotu pelvic hematoma evacuation v. Transcolonic hematoma evaculation discussed. Will keep with current plan of rectal tube now augmented by hematoma drain to help expedite resolution. Now agree that trial of rectal tube removal warranted as output >300cc from hematoma drain.  7 - Disposition - SNF pending nutritional status and ileus resolution.  Greatly appreciate hospitalist, gen surgery, ID, hematology,PT, GI  input.  [facility], Mayu Ronk 07/18/2012

## 2012-07-18 NOTE — Telephone Encounter (Signed)
Noted  

## 2012-07-18 NOTE — Progress Notes (Signed)
6 Days Post-Op  Subjective: Pt having pain from glute drain, but no rectal pain or abdominal pain.  Pt having BM's around the tube, so the rectal tube has been discontinued.  Pt ambulating, tolerating diet.  Urinating normally.  Objective: Vital signs in last 24 hours: Temp:  [97.2 F (36.2 C)-97.9 F (36.6 C)] 97.2 F (36.2 C) (07/17 0552) Pulse Rate:  [78-90] 80 (07/17 0552) Resp:  [16-18] 18 (07/17 0552) BP: (123-130)/(78-87) 127/87 mmHg (07/17 0552) SpO2:  [99 %-100 %] 99 % (07/17 0552) Last BM Date: 07/18/12  Intake/Output from previous day: 07/16 0701 - 07/17 0700 In: 2248.3 [P.O.:960; I.V.:858.3] Out: 3600 [Urine:2925; Drains:100; Stool:575] Intake/Output this shift:    PE: Gen:  Alert, NAD, pleasant Abd: Soft, minimal tenderness in the LLQ, ND Rectum:  Rectal tube removed, large hemorrhoid which is not thrombosed, no flank bleeding per rectum  Lab Results:   Recent Labs  07/17/12 0519  WBC 7.9  HGB 13.3  HCT 41.6  PLT 252   BMET  Recent Labs  07/17/12 0519 07/18/12 0415  NA 141 139  K 3.4* 3.7  CL 109 109  CO2 22 20  GLUCOSE 112* 110*  BUN 7 5*  CREATININE 0.99 0.98  CALCIUM 9.1 9.2   PT/INR  Recent Labs  07/15/12 1450  LABPROT 13.9  INR 1.09   CMP     Component Value Date/Time   NA 139 07/18/2012 0415   NA 139 04/18/2012 0801   K 3.7 07/18/2012 0415   K 3.9 04/18/2012 0801   CL 109 07/18/2012 0415   CL 105 04/18/2012 0801   CO2 20 07/18/2012 0415   CO2 22 04/18/2012 0801   GLUCOSE 110* 07/18/2012 0415   GLUCOSE 121* 04/18/2012 0801   BUN 5* 07/18/2012 0415   BUN 9.1 04/18/2012 0801   CREATININE 0.98 07/18/2012 0415   CREATININE 1.1 04/18/2012 0801   CALCIUM 9.2 07/18/2012 0415   CALCIUM 9.3 04/18/2012 0801   PROT 6.2 07/11/2012 0410   PROT 7.3 04/18/2012 0801   ALBUMIN 2.7* 07/11/2012 0410   ALBUMIN 2.7* 04/18/2012 0801   AST 15 07/11/2012 0410   AST 16 04/18/2012 0801   ALT 15 07/11/2012 0410   ALT 22 04/18/2012 0801   ALKPHOS 63 07/11/2012  0410   ALKPHOS 82 04/18/2012 0801   BILITOT 0.3 07/11/2012 0410   BILITOT 0.46 04/18/2012 0801   GFRNONAA 84* 07/18/2012 0415   GFRAA >90 07/18/2012 0415   Lipase     Component Value Date/Time   LIPASE 25 05/13/2012 1650       Studies/Results: No results found.  Anti-infectives: Anti-infectives   Start     Dose/Rate Route Frequency Ordered Stop   07/11/12 1200  vancomycin (VANCOCIN) 50 mg/mL oral solution 250 mg     250 mg Oral 4 times per day 07/11/12 1013     07/06/12 1800  vancomycin (VANCOCIN) 500 mg in sodium chloride irrigation 0.9 % 100 mL ENEMA  Status:  Discontinued     500 mg Rectal 4 times per day 07/06/12 1335 07/11/12 1244   07/06/12 1430  metroNIDAZOLE (FLAGYL) IVPB 500 mg  Status:  Discontinued     500 mg 100 mL/hr over 60 Minutes Intravenous 3 times per day 07/06/12 1335 07/14/12 0725   06/25/12 1000  vancomycin (VANCOCIN) 1,250 mg in sodium chloride 0.9 % 250 mL IVPB  Status:  Discontinued     1,250 mg 166.7 mL/hr over 90 Minutes Intravenous Every 12 hours 06/25/12 0856 06/28/12  1102   06/24/12 2000  fluconazole (DIFLUCAN) IVPB 200 mg     200 mg 100 mL/hr over 60 Minutes Intravenous Every 24 hours 06/24/12 1733 06/26/12 2322   06/23/12 0900  vancomycin (VANCOCIN) 1,250 mg in sodium chloride 0.9 % 250 mL IVPB     1,250 mg 166.7 mL/hr over 90 Minutes Intravenous Every 12 hours 06/23/12 0522 06/24/12 2231   06/23/12 0600  piperacillin-tazobactam (ZOSYN) IVPB 3.375 g  Status:  Discontinued     3.375 g 12.5 mL/hr over 240 Minutes Intravenous 3 times per day 06/23/12 0447 06/28/12 1102   06/23/12 0330  cefTRIAXone (ROCEPHIN) 1 g in dextrose 5 % 50 mL IVPB     1 g 100 mL/hr over 30 Minutes Intravenous  Once 06/23/12 0319 06/23/12 0440       Assessment/Plan Rectal obstruction from 13cm pelvic hematoma s/p multiple urologic surgeries, currently with rectal tube  Bladder cancer  C. Diff colitis  Hemorrhoids - conservative tx, f/u in office  H/o DVT B/L PE on  heparin  Deconditioning   Plan: 1. IR drain in place, 100cc/24hrs (drained >359mL so far) , drain care  2. Would continue conservatively for now (drain), rectal tube removed today per GI 3. Do not think he will need surgery for this as it is resolving well 4. Ambulate and IS  6. SCD's and heparin  We will sign off, call with questions or concerns.     LOS: 26 days    Olson, Chad Deeney 07/18/2012, 10:16 AM Pager: [phone]

## 2012-07-18 NOTE — Progress Notes (Signed)
Rectal obstruction seems to have resolved with hematoma partially decompressed  Would keep drain in until on solid diet w/o tube x 2 days AND output <85mL/day x 2 days minimum AND confirmation of resolution (or at least marked reduction) on the hematoma by f/u CT scan next week.  OK to d/c from gen surgery standpoint if meets criteria (anticipate in 1-2 days):  Tolerating oral intake well Ambulating in walkways Adequate pain control without IV medications Urinating  Having flatus

## 2012-07-18 NOTE — Telephone Encounter (Signed)
Message copied by Florene Glen on Thu Jul 18, 2012  1:14 PM ------      Message from: Beverley Fiedler      Created: Thu Jul 11, 2012  3:33 PM       Cancel and decision can be made by inpt team            ----- Message -----         From: Linna Hoff, RN         Sent: 07/09/2012   2:12 PM           To: Beverley Fiedler, MD            Pt is still an in pt and your remarks after EGD stated repeat EGD as an outpt. I will put in a reminder for much later appt or just cancel this? Thanks       ------

## 2012-07-18 NOTE — Progress Notes (Signed)
[facility] Progress Note  Subjective:  Still having stool around rectal tube.  >300 cc's from drain so far.  Objective:  Vital signs in last 24 hours: Temp:  [97.2 F (36.2 C)-97.9 F (36.6 C)] 97.2 F (36.2 C) (07/17 0552) Pulse Rate:  [78-90] 80 (07/17 0552) Resp:  [16-18] 18 (07/17 0552) BP: (123-130)/(78-87) 127/87 mmHg (07/17 0552) SpO2:  [99 %-100 %] 99 % (07/17 0552) Last BM Date: 07/18/12 General:   Alert, Well-developed, in NAD Heart:  Regular rate and rhythm; no murmurs Pulm:  CTAB.  No W/R/R. Abdomen:  Soft, nontender and nondistended. Normal bowel sounds, without guarding, and without rebound.   Extremities:  Without edema. Neurologic:  Alert and  oriented x4;  grossly normal neurologically. Psych:  Alert and cooperative. Normal mood and affect.  Intake/Output from previous day: 07/16 0701 - 07/17 0700 In: 2248.3 [P.O.:960; I.V.:858.3] Out: 3600 [Urine:2925; Drains:100; Stool:575]  Lab Results:  Recent Labs  07/17/12 0519  WBC 7.9  HGB 13.3  HCT 41.6  PLT 252   BMET  Recent Labs  07/16/12 0415 07/17/12 0519 07/18/12 0415  NA 139 141 139  K 3.5 3.4* 3.7  CL 110 109 109  CO2 21 22 20   GLUCOSE 101* 112* 110*  BUN 8 7 5*  CREATININE 0.99 0.99 0.98  CALCIUM 8.9 9.1 9.2   PT/INR  Recent Labs  07/15/12 1450  LABPROT 13.9  INR 1.09   Assessment / Plan: 67 y.o. male with rectal obstruction/ischemia from 13cm pelvic hematoma, deconditioning, Cdiff infection, bladder cancer s/p several urinary surgeries/complications.   Continue po vanc another 1 day for the Cdiff, then ok to discontinue it. S/p flexible sigmoidoscopy with 32 Fr red rubber rectal tube placed for decompression, which is working well. S/p transgluteal drain placed into the liquifying hematoma by IR with just over 300 cc out so far.  He is having stool around the tube, indicating that he is likely opening up.  Will have them remove the tube today and advance diet.     LOS: 26 days   ZEHR, JESSICA D.  07/18/2012, 8:47 AM  Pager number [phone]  He continues to pass stool around the rectal tube. I would remove the rectal tube and advance diet.  I have personally taken an interval history, reviewed the chart, and examined the patient.  I agree with the extender's note, impression and recommendations.  Barbette Hair. Arlyce Dice, MD, [facility] [phone]

## 2012-07-18 NOTE — Progress Notes (Signed)
6 Days Post-Op  Subjective: Pt doing fairly well today; tolerating diet well; has had some BM around tube; still with some buttocks soreness secondary to drain  Objective: Vital signs in last 24 hours: Temp:  [97.2 F (36.2 C)-97.9 F (36.6 C)] 97.2 F (36.2 C) (07/17 0552) Pulse Rate:  [78-90] 80 (07/17 0552) Resp:  [16-18] 18 (07/17 0552) BP: (123-130)/(78-87) 127/87 mmHg (07/17 0552) SpO2:  [99 %-100 %] 99 % (07/17 0552) Last BM Date: 07/18/12  Intake/Output from previous day: 07/16 0701 - 07/17 0700 In: 2248.3 [P.O.:960; I.V.:858.3] Out: 3600 [Urine:2925; Drains:100; Stool:575] Intake/Output this shift:    TG drain intact, dressing dry, site mild-mod tender, latest recorded output 100 cc's; fluid cx's/cyt pending  Lab Results:   Recent Labs  07/17/12 0519  WBC 7.9  HGB 13.3  HCT 41.6  PLT 252   BMET  Recent Labs  07/17/12 0519 07/18/12 0415  NA 141 139  K 3.4* 3.7  CL 109 109  CO2 22 20  GLUCOSE 112* 110*  BUN 7 5*  CREATININE 0.99 0.98  CALCIUM 9.1 9.2   PT/INR  Recent Labs  07/15/12 1450  LABPROT 13.9  INR 1.09   ABG No results found for this basename: PHART, PCO2, PO2, HCO3,  in the last 72 hours  Studies/Results: No results found.  Anti-infectives: Anti-infectives   Start     Dose/Rate Route Frequency Ordered Stop   07/11/12 1200  vancomycin (VANCOCIN) 50 mg/mL oral solution 250 mg     250 mg Oral 4 times per day 07/11/12 1013     07/06/12 1800  vancomycin (VANCOCIN) 500 mg in sodium chloride irrigation 0.9 % 100 mL ENEMA  Status:  Discontinued     500 mg Rectal 4 times per day 07/06/12 1335 07/11/12 1244   07/06/12 1430  metroNIDAZOLE (FLAGYL) IVPB 500 mg  Status:  Discontinued     500 mg 100 mL/hr over 60 Minutes Intravenous 3 times per day 07/06/12 1335 07/14/12 0725   06/25/12 1000  vancomycin (VANCOCIN) 1,250 mg in sodium chloride 0.9 % 250 mL IVPB  Status:  Discontinued     1,250 mg 166.7 mL/hr over 90 Minutes Intravenous Every  12 hours 06/25/12 0856 06/28/12 1102   06/24/12 2000  fluconazole (DIFLUCAN) IVPB 200 mg     200 mg 100 mL/hr over 60 Minutes Intravenous Every 24 hours 06/24/12 1733 06/26/12 2322   06/23/12 0900  vancomycin (VANCOCIN) 1,250 mg in sodium chloride 0.9 % 250 mL IVPB     1,250 mg 166.7 mL/hr over 90 Minutes Intravenous Every 12 hours 06/23/12 0522 06/24/12 2231   06/23/12 0600  piperacillin-tazobactam (ZOSYN) IVPB 3.375 g  Status:  Discontinued     3.375 g 12.5 mL/hr over 240 Minutes Intravenous 3 times per day 06/23/12 0447 06/28/12 1102   06/23/12 0330  cefTRIAXone (ROCEPHIN) 1 g in dextrose 5 % 50 mL IVPB     1 g 100 mL/hr over 30 Minutes Intravenous  Once 06/23/12 0319 06/23/12 0440      Assessment/Plan: s/p pelvic hematoma drainage 7/14; consider removing rectal tube if ok with urology/GI; ambulate pt; aggressive irrigation of drain to expedite evacuation of hematoma; check f/u CT once output diminishes or if clinical status worsens; check drain fluid cyt/cx's   LOS: 26 days    Chad Olson,D [facility] 07/18/2012

## 2012-07-19 LAB — CLOSTRIDIUM DIFFICILE BY PCR: Toxigenic C. Difficile by PCR: NEGATIVE

## 2012-07-19 NOTE — Progress Notes (Signed)
Subjective: Patient with history of bladder cancer s/p revision of bilateral ureteral-conduit anastomosis 06/13/12 with pelvic fluid collection noted on CT 6/22 s/p 32F pigtail drain placement. Drain is flushing and draining well. Patient is doing well c/o loose stools, c.diff being tested. Patient denies pain around drain site. He denies fever or chills.  Objective: Physical Exam: BP 129/88  Pulse 92  Temp(Src) 97.3 F (36.3 C) (Oral)  Resp 20  Ht 5\' 11"  (1.803 m)  Wt 220 lb 11.2 oz (100.109 kg)  BMI 30.8 kg/m2  SpO2 98%  TG Drain: C/D/I, no signs of bleeding, hematoma or infection around site. Output 33cc blood (100)   Labs: Cultures no growth/pending Cytology pending CBC  Recent Labs  07/17/12 0519  WBC 7.9  HGB 13.3  HCT 41.6  PLT 252   BMET  Recent Labs  07/17/12 0519 07/18/12 0415  NA 141 139  K 3.4* 3.7  CL 109 109  CO2 22 20  GLUCOSE 112* 110*  BUN 7 5*  CREATININE 0.99 0.98  CALCIUM 9.1 9.2   LFT No results found for this basename: PROT, ALBUMIN, AST, ALT, ALKPHOS, BILITOT, BILIDIR, IBILI, LIPASE,  in the last 72 hours PT/INR No results found for this basename: LABPROT, INR,  in the last 72 hours   Studies/Results: No results found.  Assessment/Plan: S/p pelvic hematoma drain 7/14 output 33cc from 100cc yesterday; Cultures no growth and pending, cystology pending. Rectal tube removed. Loose stools, c.diff collected and pending. Repeat CT once output diminishes or if clinical status worsens.  Await cultures and cytology. Continue to flush and monitor output.   LOS: 27 days    Berneta Levins PA-C 07/19/2012 11:12 AM

## 2012-07-19 NOTE — Progress Notes (Signed)
NUTRITION FOLLOW UP  Intervention:   Continue Ensure Complete TID Provide Resource Breeze once daily Discontinue Ensure Pudding once daily Recommend Florastor medication daily  Nutrition Dx:   Inadequate oral intake related to diet order as evidenced by clear liquid diet; ongoing, diet advanced but, still inadequate PO intake  New goal: Pt to meet >/= 90% of their estimated nutrition needs; not met  Monitor:   Weights; 216 lbs per bed scale 7/16 - 6 lb wt loss since 6/22 Labs; low BUN/GFR PO intake; very poor BM; 7/18 diarrhea  Assessment:   Per MD note S/p pelvic hematoma drain 7/14 output 33cc from 100cc yesterday; Cultures no growth and pending, cystology pending. Rectal tube removed. Pt's rectal rube has been removed and pt's wife reports that pt has had constant diarrhea and therefore isn't eating much or drinking much Ensure. Wife reports that pt has been drinking a lot of juice and ice water. Encouraged pt to limit juice to 1-2 cups daily as this can make diarrhea worse; discussed foods for pt to try that may help form stool. Pt may benefit from Florastor to help balance intestinal flora. Pt states he hasn't been receiving Ensure pudding but, RN reports pt received it yesterday and stated he hated it. C diff results negative.   Height: Ht Readings from Last 1 Encounters:  06/23/12 5\' 11"  (1.803 m)    Weight Status:   Wt Readings from Last 1 Encounters:  06/23/12 220 lb 11.2 oz (100.109 kg)    Re-estimated needs:  Kcal: 1950-2100  Protein: 95-120g  Fluid: 1.9-2.1 L/day   Skin: Mid abdominal incision, right buttocks abrasion, back left skin tear, left buttocks incision  Diet Order: Fiber Restricted   Intake/Output Summary (Last 24 hours) at 07/19/12 1252 Last data filed at 07/19/12 1224  Gross per 24 hour  Intake   1525 ml  Output   2716 ml  Net  -1191 ml    Last BM: 7/18 diarrhea   Labs:   Recent Labs Lab 07/16/12 0415 07/17/12 0519 07/18/12 0415   NA 139 141 139  K 3.5 3.4* 3.7  CL 110 109 109  CO2 21 22 20   BUN 8 7 5*  CREATININE 0.99 0.99 0.98  CALCIUM 8.9 9.1 9.2  MG 2.0  --   --   GLUCOSE 101* 112* 110*    CBG (last 3)  No results found for this basename: GLUCAP,  in the last 72 hours  Scheduled Meds: . famotidine  20 mg Oral Daily  . feeding supplement  237 mL Oral TID WC  . feeding supplement  1 Container Oral Q24H  . feeding supplement  1 Container Oral Q24H  . heparin subcutaneous  5,000 Units Subcutaneous Q8H  . hydrocortisone   Rectal TID  . metoCLOPramide (REGLAN) injection  10 mg Intravenous TID AC & HS  . metoprolol  5 mg Intravenous Q6H  . ondansetron  4 mg Intravenous Q4H  . potassium chloride  40 mEq Oral TID  . vancomycin  250 mg Oral Q6H    Continuous Infusions: . dextrose 5 % and 0.45 % NaCl with KCl 20 mEq/L 50 mL/hr at 07/18/12 1855   Ian Malkin RD, LDN Inpatient Clinical Dietitian Pager: [phone] After Hours Pager: [phone]

## 2012-07-19 NOTE — Progress Notes (Signed)
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 Healed well initially and sent home 6/17. Loopogram 6/27 without internal leak.  2 - Fever / h/o Bacteruria - Pt with MRSA in urine by hospital UCX 03/2012 and again subsequently in blood. Now on Vancomycin daily per ID x 4-6 week total (nearing end of course). Most recent UCX, BCX negative, New set pending from 6/22. Pt readmitted with low-grade fever 6/22 to 100.7 and malaise, no sig leukocytosis. Now on Vanc + Zosyn emirically. UCX with yeast (trated diflucan x 3 days), BCX no growth to date.Pelvic fluid aspirate and no growth / final.  ID now following,and DC'd all ABX given eval except for current C. Diff treatment.  3 - Heme / Recent DVT + PE -incidental PE by CT 03/2012 now on Lovenox, DVT resolved by most recent LE duplex. Repeat CT-PE 6/23 without PE or active bleed from pelvis and Lovenox DC'd. Hgb drift to 7 6/24 and transfused 2 u with response to 8, transfused 2u additional 6/25 with increase to 10. Hematology consult Chad Olson) also agrees no treatment dose anticoagulation at this point, but resume proph dosing since he is not ambulating much.  4 - Pelvic Fluid Collection - Pelvic fluid collection without rim-enhancement noted on CT from ER 6/22. Appears partially simple fluid with some dependent blood by HU analysis, no active extrav of blood or urine by imaging. IR aspiration performed 6/26 and fluid c/w hematoma only (not purulent, not simple/drainable). Repeat aspiration with insertion of 46F pigtail performed 7/14 at which time calculated volume using ellipsoid approximation  . Output 150 mL 7/14, 60 mL 7/15, 7/16, 33mL 7/17,   5 - Hemorrhoids / Lower GI Bleed - Long h/o hemorroids. Pt with worsened / friable bleeding hemorroids on admission that are quite bothersome. No prior specific intervention, likely exacerbated by pelvic fluid collection / hematoma. Gen Surg recs conservative measures and now nearly resolved clinically.  6 - Nutrition / Nausea / C. Difficile- Pt with some element of baseline GI issues with GERD and prior small esophageal polyp. Has had exacerbation of nausea in house and not meeting nutritional goals. GI involved and initial EGD + KUB with ileus / gastroparesis picture. While ileus resolving placed on TPN and NGT for symptom relief and to bridge him with nutrition which was resumed 7/11 following successful decompression with rectal tube place with aide of sigmoidoscopy. C. Diff found positive 7/5 and started on dual therapy with flagyl + vanc enemas with plan for 14 day course which is ending soon. Rectal tube DC'd 7/17.  7 - Disposition - PT eval has recommended DC to SNF when appropriate and family amenable.  PMH sig for obesity and left knee replacement, PE + DVT (now resolved). No CV disease.   Today Chad Olson complains of recurrent nausea that is post-tussive. No emesis, still tollerating PO, Having liquid BM's.  Objective: Vital signs in last 24 hours: Temp:  [97.3 F (36.3 C)-97.6 F (36.4 C)] 97.3 F (36.3 C) (07/18 0531) Pulse Rate:  [75-93] 92 (07/18 0531) Resp:  [16-20] 20 (07/18 0531) BP: (110-130)/(74-88) 129/88 mmHg (07/18 0531) SpO2:  [98 %-99 %] 98 % (07/18 0531) Last BM Date: 07/18/12  Intake/Output from previous day: 07/17 0701 - 07/18 0700 In: 1530 [  I.V.:1500] Out: 2740 [Urine:2700; Drains:33; Stool:7] Intake/Output this shift:    General appearance: alert, cooperative, appears stated age and family at bedside Head: Normocephalic, without obvious abnormality, atraumatic Eyes: conjunctivae/corneas clear.  PERRL, EOM's intact. Fundi benign. Ears: normal TM's and external ear canals both ears Nose: Nares normal. Septum midline. Mucosa normal. No drainage or sinus tenderness. Throat: lips, mucosa, and tongue normal; teeth and gums normal Neck: no adenopathy, no carotid bruit, no JVD, supple, symmetrical, trachea midline and thyroid not enlarged, symmetric, no tenderness/mass/nodules Back: symmetric, no curvature. ROM normal. No CVA tenderness. Resp: clear to auscultation bilaterally Chest wall: no tenderness Cardio: regular rate and rhythm, S1, S2 normal, no murmur, click, rub or gallop GI: soft, non-tender; bowel sounds normal; no masses,  no organomegaly Male genitalia: normal Extremities: extremities normal, atraumatic, no cyanosis or edema and RUE PICC c/d/i. Pulses: 2+ and symmetric Skin: Skin color, texture, turgor normal. No rashes or lesions Lymph nodes: Cervical, supraclavicular, and axillary nodes normal. Neurologic: Grossly normal Incision/Wound: hematoma drain in place with scant dark bloody efflux, aspirate 3cc at bedside. RLQ urostomy pink / patent. Lower midline incision c/d/i.  Lab Results:   Recent Labs  07/17/12 0519  WBC 7.9  HGB 13.3  HCT 41.6  PLT 252   BMET  Recent Labs  07/17/12 0519 07/18/12 0415  NA 141 139  K 3.4* 3.7  CL 109 109  CO2 22 20  GLUCOSE 112* 110*  BUN 7 5*  CREATININE 0.99 0.98  CALCIUM 9.1 9.2   PT/INR No results found for this basename: LABPROT, INR,  in the last 72 hours ABG No results found for this basename: PHART, PCO2, PO2, HCO3,  in the last 72 hours  Studies/Results: No results found.  Anti-infectives: Anti-infectives   Start     Dose/Rate Route Frequency Ordered Stop   07/11/12 1200  vancomycin (VANCOCIN) 50 mg/mL oral solution 250 mg     250 mg Oral 4 times per day 07/11/12 1013     07/06/12 1800  vancomycin (VANCOCIN) 500 mg in sodium chloride irrigation 0.9 % 100 mL ENEMA  Status:  Discontinued     500 mg Rectal  4 times per day 07/06/12 1335 07/11/12 1244   07/06/12 1430  metroNIDAZOLE (FLAGYL) IVPB 500 mg  Status:  Discontinued     500 mg 100 mL/hr over 60 Minutes Intravenous 3 times per day 07/06/12 1335 07/14/12 0725   06/25/12 1000  vancomycin (VANCOCIN) 1,250 mg in sodium chloride 0.9 % 250 mL IVPB  Status:  Discontinued     1,250 mg 166.7 mL/hr over 90 Minutes Intravenous Every 12 hours 06/25/12 0856 06/28/12 1102   06/24/12 2000  fluconazole (DIFLUCAN) IVPB 200 mg     200 mg 100 mL/hr over 60 Minutes Intravenous Every 24 hours 06/24/12 1733 06/26/12 2322   06/23/12 0900  vancomycin (VANCOCIN) 1,250 mg in sodium chloride 0.9 % 250 mL IVPB     1,250 mg 166.7 mL/hr over 90 Minutes Intravenous Every 12 hours 06/23/12 0522 06/24/12 2231   06/23/12 0600  piperacillin-tazobactam (ZOSYN) IVPB 3.375 g  Status:  Discontinued     3.375 g 12.5 mL/hr over 240 Minutes Intravenous 3 times per day 06/23/12 0447 06/28/12 1102   06/23/12 0330  cefTRIAXone (ROCEPHIN) 1 g in dextrose 5 % 50 mL IVPB     1 g 100 mL/hr over 30 Minutes Intravenous  Once 06/23/12 0319 06/23/12 0440      Assessment/Plan:  1 - Bladder Cancer / Cystectomy /  Ureteral Revision- NED form cancer perspective.Loopogram without leak this admission. WIll consider staple removal and left neph tube removal under fluoro later this admission when back to baseline in terms of GI status.  2 - Fever / h/o Bacteruria - Now off wound-specific ABX given negative CX from blood, urine, pelvic fluid.  3 - Heme / Recent DVT + PE -Lovenox now stopped. SCD's in place.  . PT following. Remain proph SQ heparin + SCD's. He is now making much more progress in terms of ambulation and PT.  4 - Pelvic Fluid Collection - Imaging and aspirate c/w non-infected hemaotma. Size stable / decreasing as expected. Eplained to pt this will resolve, but slowly (weeks-mos). To help expedite resolution IR drain placed 7/14, optimistic this will be able to aspirate a  meaningful amt of hematoma to decrease colonic compression, Estimate would need to aspirate plus to achieve 50% reduction in volume based on volume estimate, now with just over 330 out so far.  5 - Hemorrhoids / Lower GI Bleed - Agree with conservative measures for now. Hgb stable.  6 - Nutrition / Nausea / C. Difficile- Remains central active issue. Agree with aggressive treatment for C. Diff and now nearing end of course. Clearly feels better with rectal tube. Further options including rectal tube / low residue diet v. Diverting colostomy with or withotu pelvic hematoma evacuation v. Transcolonic hematoma evaculation discussed. Recurrent nausea somewhat worrisome, though abd exam unimpressive. Would not rush to replace GI tubes, but will defer opinion to GI.  7 - Disposition - SNF pending nutritional status and ileus resolution.  Greatly appreciate hospitalist, gen surgery, ID, hematology,PT, GI  input.  [facility], Chad Olson 07/19/2012

## 2012-07-19 NOTE — Progress Notes (Signed)
He continues to pass loose stools since rectal tube was pulled.  Will repeat stool for C. dificile toxin. No need for f/u sigmoidoscopy.  Signing off.

## 2012-07-19 NOTE — Progress Notes (Signed)
Physical Therapy Treatment Patient Details Name: Chad Olson MRN: [id number] DOB: 1945-02-21 Today's Date: 07/19/2012 Time: 4098-1191 PT Time Calculation (min): 39 min  PT Assessment / Plan / Recommendation  PT Comments   Pt progressing this visit; Have discussed importance of PT with sister and pt; Pt would benefit from STSNF due to overall deconditioning and multiple recent admissions; I have discussed with family refraining from giving pt assist with all aspects of care and getting him to more.  Follow Up Recommendations  SNF     Does the patient have the potential to tolerate intense rehabilitation     Barriers to Discharge        Equipment Recommendations  None recommended by PT    Recommendations for Other Services    Frequency Min 3X/week   Progress towards PT Goals Progress towards PT goals: Progressing toward goals  Plan Current plan remains appropriate    Precautions / Restrictions Precautions Precautions: Fall Precaution Comments: transgluteal drain Restrictions Weight Bearing Restrictions: No   Pertinent Vitals/Pain     Mobility  Bed Mobility Bed Mobility: Supine to Sit;Sit to Supine Right Sidelying to Sit:  (30*) Supine to Sit: HOB elevated;4: Min guard (30*) Sit to Supine: 4: Min assist;HOB flat Details for Bed Mobility Assistance: cues for technique, self assist, prevent pt sister from helping pt; Transfers Transfers: Sit to Stand;Stand to Sit;Stand Pivot Transfers Sit to Stand: 5: Supervision;4: Min guard;From chair/3-in-1;From bed Stand to Sit: 4: Min guard;5: Supervision;To chair/3-in-1;To bed Stand Pivot Transfers: 4: Min guard (x2) Details for Transfer Assistance: VCs hand placement Ambulation/Gait Ambulation/Gait Assistance: 4: Min guard Ambulation Distance (Feet): 96 Feet Assistive device: Rolling walker Ambulation/Gait Assistance Details: cues for Rw position, posture, upward gaze Gait Pattern: Step-through pattern;Decreased stride  length;Wide base of support;Trunk flexed Gait velocity: decreased    Exercises General Exercises - Lower Extremity Ankle Circles/Pumps: AROM;Both;5 reps Quad Sets: AROM;10 reps;Both Long Arc Quad: AROM;Both;10 reps;Seated   PT Diagnosis:    PT Problem List:   PT Treatment Interventions:     PT Goals (current goals can now be found in the care plan section) Acute Rehab PT Goals Patient Stated Goal: wants to be more independent per sister Time For Goal Achievement: 07/31/12 Potential to Achieve Goals: Good  Visit Information  Last PT Received On: 07/19/12 Assistance Needed: +1 History of Present Illness: s/p transgluteal drain 7/14 for hematoma    Subjective Data  Subjective: pt sister present Patient Stated Goal: wants to be more independent per sister   Cognition  Cognition Arousal/Alertness: Awake/alert Behavior During Therapy: Flat affect Overall Cognitive Status: Within Functional Limits for tasks assessed    Balance  Static Sitting Balance Static Sitting - Balance Support: No upper extremity supported Static Sitting - Level of Assistance: 7: Independent Static Standing Balance Static Standing - Balance Support: No upper extremity supported Static Standing - Level of Assistance: 5: Stand by assistance  End of Session PT - End of Session Equipment Utilized During Treatment: Gait belt Activity Tolerance: Patient tolerated treatment well Patient left: in bed;with call bell/phone within reach;with family/visitor present Nurse Communication: Mobility status   GP     [facility] 07/19/2012, 11:43 AM

## 2012-07-20 MED ORDER — LOPERAMIDE HCL 2 MG PO CAPS
4.0000 mg | ORAL_CAPSULE | Freq: Once | ORAL | Status: AC
  Administered 2012-07-20: 4 mg via ORAL
  Filled 2012-07-20: qty 2

## 2012-07-20 MED ORDER — LOPERAMIDE HCL 2 MG PO CAPS
2.0000 mg | ORAL_CAPSULE | ORAL | Status: DC | PRN
  Administered 2012-07-22 – 2012-08-07 (×9): 2 mg via ORAL
  Filled 2012-07-20 (×9): qty 1

## 2012-07-20 NOTE — Progress Notes (Signed)
8 Days Post-Op  Subjective: Pelvic hematoma drain placed 7/14 Doing better  Objective: Vital signs in last 24 hours: Temp:  [97.3 F (36.3 C)-98.2 F (36.8 C)] 98.2 F (36.8 C) (07/19 0433) Pulse Rate:  [84-97] 84 (07/19 0433) Resp:  [16-20] 16 (07/19 0433) BP: (122-130)/(79-81) 125/79 mmHg (07/19 0433) SpO2:  [98 %-100 %] 100 % (07/19 0433) Last BM Date: 07/19/12  Intake/Output from previous day: 07/18 0701 - 07/19 0700 In: 1445 [P.O.:180; I.V.:1250] Out: 1285 [Urine:1235; Drains:40; Stool:10] Intake/Output this shift: Total I/O In: 125 [P.O.:120; Other:5] Out: 14 [Emesis/NG output:1; Drains:10; Stool:3]  PE:  Afeb; vss Output dark blood 40 cc yesterday; 10 cc now Cx: no growth Site clean and dry  Lab Results:  No results found for this basename: WBC, HGB, HCT, PLT,  in the last 72 hours BMET  Recent Labs  07/18/12 0415  NA 139  K 3.7  CL 109  CO2 20  GLUCOSE 110*  BUN 5*  CREATININE 0.98  CALCIUM 9.2   PT/INR No results found for this basename: LABPROT, INR,  in the last 72 hours ABG No results found for this basename: PHART, PCO2, PO2, HCO3,  in the last 72 hours  Studies/Results: No results found.  Anti-infectives:   Assessment/Plan: s/p Procedure(s): FLEXIBLE SIGMOIDOSCOPY (N/A)  Pelvic hematoma drain intact Will follow Will need re Ct when output 10 cc/24 hrs  LOS: 28 days    Brittiany Wiehe A 07/20/2012

## 2012-07-20 NOTE — Progress Notes (Signed)
Notarized Designer, industrial/product.  Providence Crosby, LCSWA Clinical Social Work [phone]

## 2012-07-20 NOTE — Progress Notes (Addendum)
8 Days Post-Op Subjective: Patient reports that his bowel movements and are loose but he continues to have regular bowel movements. He also reports that his appetite is improving. He does not have any new pain or new complaints today.  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 Healed well initially and sent home 6/17. Loopogram 6/27 without internal leak.  2 - Fever / h/o Bacteruria - Pt with MRSA in urine by hospital UCX 03/2012 and again subsequently in blood. Now on Vancomycin daily per ID x 4-6 week total (nearing end of course). Most recent UCX, BCX negative, New set pending from 6/22. Pt readmitted with low-grade fever 6/22 to 100.7 and malaise, no sig leukocytosis. Now on Vanc + Zosyn emirically. UCX with yeast (trated diflucan x 3 days), BCX no growth to date.Pelvic fluid aspirate and no growth / final. ID now following,and DC'd all ABX given eval except for current C. Diff treatment.  3 - Heme / Recent DVT + PE -incidental PE by CT 03/2012 now on Lovenox, DVT resolved by most recent LE duplex. Repeat CT-PE 6/23 without PE or active bleed from pelvis and Lovenox DC'd. Hgb drift to 7 6/24 and transfused 2 u with response to 8, transfused 2u additional 6/25 with increase to 10. Hematology consult [name]) also agrees no treatment dose anticoagulation at this point, but resume proph dosing since he is not ambulating much.  4 - Pelvic Fluid Collection - Pelvic fluid collection without rim-enhancement noted on CT from ER 6/22. Appears partially simple fluid with some dependent blood by HU analysis, no active extrav of blood or urine by imaging. IR  aspiration performed 6/26 and fluid c/w hematoma only (not purulent, not simple/drainable). Repeat aspiration with insertion of 56F pigtail performed 7/14 at which time calculated volume using ellipsoid approximation . Output 150 mL 7/14, 60 mL 7/15, 7/16, 33mL 7/17,  5 - Hemorrhoids / Lower GI Bleed - Long h/o hemorroids. Pt with worsened / friable bleeding hemorroids on admission that are quite bothersome. No prior specific intervention, likely exacerbated by pelvic fluid collection / hematoma. Gen Surg recs conservative measures and now nearly resolved clinically.  6 - Nutrition / Nausea / C. Difficile- Pt with some element of baseline GI issues with GERD and prior small esophageal polyp. Has had exacerbation of nausea in house and not meeting nutritional goals. GI involved and initial EGD + KUB with ileus / gastroparesis picture. While ileus resolving placed on TPN and NGT for symptom relief and to bridge him with nutrition which was resumed 7/11 following successful decompression with rectal tube place with aide of sigmoidoscopy. C. Diff found positive 7/5 and started on dual therapy with flagyl + vanc enemas with plan for 14 day course which is ending soon. Rectal tube DC'd 7/17.  7 - Disposition - PT eval has recommended DC to SNF when appropriate and family amenable.  PMH sig for obesity and left knee replacement, PE + DVT (now resolved). No CV disease.    Objective: Vital signs in last 24 hours: Temp:  [97.3 F (36.3 C)-98.2 F (36.8 C)] 98.2 F (36.8 C) (07/19 0433) Pulse Rate:  [84-97] 84 (07/19 0433) Resp:  [16-20] 16 (07/19 0433) BP: (122-130)/(79-81) 125/79 mmHg (07/19 0433) SpO2:  [98 %-100 %]  100 % (07/19 0433)  Intake/Output from previous day: 07/18 0701 - 07/19 0700 In: 790 [P.O.:180; I.V.:600] Out: 1274 [Urine:1235; Drains:30; Stool:9] Intake/Output this shift: Total I/O In: 5 [Other:5] Out: 648 [Urine:635; Drains:10; Stool:3]  Physical Exam:  General:alert,  cooperative and no distress GI: not done and soft, non tender, normal bowel sounds, no palpable masses, no organomegaly, no inguinal hernia His stents are in position. His pelvic drain continues to have output that appears dark consistent with resolving hematoma.   Lab Results: No results found for this basename: HGB, HCT,  in the last 72 hours BMET  Recent Labs  07/18/12 0415  NA 139  K 3.7  CL 109  CO2 20  GLUCOSE 110*  BUN 5*  CREATININE 0.98  CALCIUM 9.2   No results found for this basename: LABPT, INR,  in the last 72 hours No results found for this basename: LABURIN,  in the last 72 hours Results for orders placed during the hospital encounter of 06/22/12  CULTURE, BLOOD (ROUTINE X 2)     Status: None   Collection Time    06/22/12 10:43 PM      Result Value Range Status   Specimen Description BLOOD LEFT ANTECUBITAL   Final   Special Requests BOTTLES DRAWN AEROBIC AND ANAEROBIC 5CC   Final   Culture  Setup Time 06/23/2012 17:47   Final   Culture NO GROWTH 5 DAYS   Final   Report Status 06/29/2012 FINAL   Final  CULTURE, BLOOD (ROUTINE X 2)     Status: None   Collection Time    06/22/12 10:57 PM      Result Value Range Status   Specimen Description BLOOD LEFT ARM   Final   Special Requests BOTTLES DRAWN AEROBIC AND ANAEROBIC 10CC   Final   Culture  Setup Time 06/23/2012 17:47   Final   Culture NO GROWTH 5 DAYS   Final   Report Status 06/29/2012 FINAL   Final  URINE CULTURE     Status: None   Collection Time    06/23/12  2:17 AM      Result Value Range Status   Specimen Description URINE, CATHETERIZED   Final   Special Requests Normal   Final   Culture  Setup Time 06/23/2012 15:21   Final   Colony Count 35,000 COLONIES/ML   Final   Culture YEAST   Final   Report Status 06/24/2012 FINAL   Final  MRSA PCR SCREENING     Status: None   Collection Time    06/24/12  9:58 AM      Result Value Range Status   MRSA by PCR NEGATIVE  NEGATIVE Final   Comment:             The GeneXpert MRSA Assay (FDA     approved for NASAL specimens     only), is one component of a     comprehensive MRSA colonization     surveillance program. It is not     intended to diagnose MRSA     infection nor to guide or     monitor treatment for     MRSA infections.  CULTURE, ROUTINE-ABSCESS     Status: None   Collection Time    06/27/12 12:51 PM      Result Value Range Status   Specimen Description ABSCESS PELVIC   Final   Special Requests NONE   Final   Gram Stain     Final  Value: MODERATE WBC PRESENT, PREDOMINANTLY PMN     NO SQUAMOUS EPITHELIAL CELLS SEEN     NO ORGANISMS SEEN   Culture NO GROWTH 3 DAYS   Final   Report Status 2020/09/1512 FINAL   Final  CLOSTRIDIUM DIFFICILE BY PCR     Status: Abnormal   Collection Time    07/06/12 10:05 AM      Result Value Range Status   C difficile by pcr POSITIVE (*) NEGATIVE Final   Comment: CRITICAL RESULT CALLED TO, READ BACK BY AND VERIFIED WITH:     E.HARLESS,RN [id number] 07/06/12 EHOWARD  CULTURE, ROUTINE-ABSCESS     Status: None   Collection Time    07/15/12  4:55 PM      Result Value Range Status   Specimen Description PERITONEAL CAVITY   Final   Special Requests NONE   Final   Gram Stain     Final   Value: NO WBC SEEN     NO SQUAMOUS EPITHELIAL CELLS SEEN     NO ORGANISMS SEEN   Culture NO GROWTH 3 DAYS   Final   Report Status 07/18/2012 FINAL   Final  BODY FLUID CULTURE     Status: None   Collection Time    07/18/12  3:13 PM      Result Value Range Status   Specimen Description OTHER PELVIC   Final   Special Requests Normal   Final   Gram Stain     Final   Value: RARE WBC PRESENT, PREDOMINANTLY PMN     NO ORGANISMS SEEN   Culture NO GROWTH   Final   Report Status PENDING   Incomplete  CLOSTRIDIUM DIFFICILE BY PCR     Status: None   Collection Time    07/19/12  9:31 AM      Result Value Range Status   C difficile by pcr NEGATIVE  NEGATIVE Final    Studies/Results: No results  found.  Assessment/Plan: He overall appears to be improving. He is tolerating his diet without any nausea or vomiting. He is having bowel movements although they remained soft a culture for C. difficile was found to be negative. In addition a culture of his pelvic drain fluid was negative.  Continue current care with possible discharge in the near future.   LOS: 28 days   Lakyn Alsteen C 07/20/2012, 6:19 AM   Addendum: The patient's wife was not present when I rounded this morning. She gave me information that contradicted what the patient had informed me. She told her that in fact he was not tolerating a diet as well as he had indicated but rather that he was taking some and sure but very little else orally. She was concerned about his lack of oral intake and also informed me that his diarrhea was not in fact stable but, in her estimation was worsening. We discussed his negative cultures including negative C. difficile. I informed her that I would be placing an order for a nutrition consult.

## 2012-07-20 NOTE — Progress Notes (Signed)
Pt and wife have requested to hold on 2 doses of reglan and ensure to see if diarrhea will decrease, it has slowed a little but still having loose stool w/ turning, standing, coughing.  I encouraged pt to try to eat since holding on the ensure. He was able to take 2 bites of chicken and a french fry, bites of chocolate ice cream.

## 2012-07-20 NOTE — Progress Notes (Signed)
Paged Dr. Ardyth Harps letting her know the family request a time to meet with all attending physicians as soon as possible. Reported to day shift nurse about the requests for her to follow up on with Dr. Ardyth Harps today.   Ernesta Amble, RN

## 2012-07-21 LAB — BASIC METABOLIC PANEL
BUN: 6 mg/dL (ref 6–23)
CO2: 20 mEq/L (ref 19–32)
Calcium: 9.7 mg/dL (ref 8.4–10.5)
GFR calc non Af Amer: 74 mL/min — ABNORMAL LOW (ref >90)
Glucose, Bld: 127 mg/dL — ABNORMAL HIGH (ref 70–99)
Sodium: 135 mEq/L (ref 135–145)

## 2012-07-21 NOTE — Progress Notes (Signed)
9 Days Post-Op Subjective: Patient reports a significant decrease in his diarrhea. His wife reports he is still having borborygmi. His appetite is poor but it is not because he is nauseated. He indicated to me that he just does not have an appetite. He has not been ambulating significantly do to his significant diarrhea which now is improving and so he is going to try and ambulate today.  Objective: Vital signs in last 24 hours: Temp:  [97.7 F (36.5 C)-98 F (36.7 C)] 98 F (36.7 C) (07/20 0603) Pulse Rate:  [72-96] 72 (07/20 0603) Resp:  [16-20] 16 (07/20 0603) BP: (128-149)/(87-95) 128/87 mmHg (07/20 0603) SpO2:  [99 %-100 %] 99 % (07/20 0603)  Intake/Output from previous day: 07/19 0701 - 07/20 0700 In: 1300 [P.O.:480; I.V.:800] Out: 1970 [Urine:1925; Emesis/NG output:1; Drains:35; Stool:9] Intake/Output this shift: Total I/O In: 465 [P.O.:240; I.V.:215; Other:10] Out: 1370 [Urine:1350; Drains:15; Stool:5]   Lab Results: No results found for this basename: HGB, HCT,  in the last 72 hours BMET No results found for this basename: NA, K, CL, CO2, GLUCOSE, BUN, CREATININE, CALCIUM,  in the last 72 hours No results found for this basename: LABPT, INR,  in the last 72 hours No results found for this basename: LABURIN,  in the last 72 hours Results for orders placed during the hospital encounter of 06/22/12  CULTURE, BLOOD (ROUTINE X 2)     Status: None   Collection Time    06/22/12 10:43 PM      Result Value Range Status   Specimen Description BLOOD LEFT ANTECUBITAL   Final   Special Requests BOTTLES DRAWN AEROBIC AND ANAEROBIC 5CC   Final   Culture  Setup Time 06/23/2012 17:47   Final   Culture NO GROWTH 5 DAYS   Final   Report Status 06/29/2012 FINAL   Final  CULTURE, BLOOD (ROUTINE X 2)     Status: None   Collection Time    06/22/12 10:57 PM      Result Value Range Status   Specimen Description BLOOD LEFT ARM   Final   Special Requests BOTTLES DRAWN AEROBIC AND  ANAEROBIC 10CC   Final   Culture  Setup Time 06/23/2012 17:47   Final   Culture NO GROWTH 5 DAYS   Final   Report Status 06/29/2012 FINAL   Final  URINE CULTURE     Status: None   Collection Time    06/23/12  2:17 AM      Result Value Range Status   Specimen Description URINE, CATHETERIZED   Final   Special Requests Normal   Final   Culture  Setup Time 06/23/2012 15:21   Final   Colony Count 35,000 COLONIES/ML   Final   Culture YEAST   Final   Report Status 06/24/2012 FINAL   Final  MRSA PCR SCREENING     Status: None   Collection Time    06/24/12  9:58 AM      Result Value Range Status   MRSA by PCR NEGATIVE  NEGATIVE Final   Comment:            The GeneXpert MRSA Assay (FDA     approved for NASAL specimens     only), is one component of a     comprehensive MRSA colonization     surveillance program. It is not     intended to diagnose MRSA     infection nor to guide or     monitor treatment for  MRSA infections.  CULTURE, ROUTINE-ABSCESS     Status: None   Collection Time    06/27/12 12:51 PM      Result Value Range Status   Specimen Description ABSCESS PELVIC   Final   Special Requests NONE   Final   Gram Stain     Final   Value: MODERATE WBC PRESENT, PREDOMINANTLY PMN     NO SQUAMOUS EPITHELIAL CELLS SEEN     NO ORGANISMS SEEN   Culture NO GROWTH 3 DAYS   Final   Report Status 06/30/2012 FINAL   Final  CLOSTRIDIUM DIFFICILE BY PCR     Status: Abnormal   Collection Time    07/06/12 10:05 AM      Result Value Range Status   C difficile by pcr POSITIVE (*) NEGATIVE Final   Comment: CRITICAL RESULT CALLED TO, READ BACK BY AND VERIFIED WITH:     E.HARLESS,RN [id number] 07/06/12 EHOWARD  CULTURE, ROUTINE-ABSCESS     Status: None   Collection Time    07/15/12  4:55 PM      Result Value Range Status   Specimen Description PERITONEAL CAVITY   Final   Special Requests NONE   Final   Gram Stain     Final   Value: NO WBC SEEN     NO SQUAMOUS EPITHELIAL CELLS SEEN     NO  ORGANISMS SEEN   Culture NO GROWTH 3 DAYS   Final   Report Status 07/18/2012 FINAL   Final  BODY FLUID CULTURE     Status: None   Collection Time    07/18/12  3:13 PM      Result Value Range Status   Specimen Description OTHER PELVIC   Final   Special Requests Normal   Final   Gram Stain     Final   Value: RARE WBC PRESENT, PREDOMINANTLY PMN     NO ORGANISMS SEEN   Culture NO GROWTH 2 DAYS   Final   Report Status PENDING   Incomplete  CLOSTRIDIUM DIFFICILE BY PCR     Status: None   Collection Time    07/19/12  9:31 AM      Result Value Range Status   C difficile by pcr NEGATIVE  NEGATIVE Final    Studies/Results: No results found.  Assessment/Plan: Continues to do fairly well. He was having significant watery diarrhea and with a negative C. difficile toxin screen he asked if he could try medication to help control that. I prescribed Imodium which was to be used sparingly and has resulted in improvement in his diarrhea. His appetite is poor and therefore his calorie intake is low. A nutrition consult has been entered. He was not ambulating because when he would stand he would have diarrhea so now that that is under better control he may be able to begin ambulating more.  Imodium was prescribed for his diarrhea. This seems to have helped.  A nutrition consult entered but he has not been seen by the nutritionist yet.  He will begin ambulating today.   LOS: 29 days   Maryella Abood C 07/21/2012, 6:28 AM

## 2012-07-21 NOTE — Progress Notes (Signed)
NUTRITION FOLLOW UP / Consult  Intervention:   Continue Ensure Complete TID Discontinue Resource Breeze Continue to encourage PO intake  Nutrition Dx:   Inadequate oral intake related to diet order as evidenced by clear liquid diet; ongoing, diet advanced to low fiber, but still inadequate PO intake  Goal:   Pt to meet >/= 90% of their estimated nutrition needs; not met  Monitor:   Weights, PO intake, bowel movements, labs  Assessment:   Pt with history of bladder cancer s/p revision of bilateral ureteral-conduit anastomosis. Pt is still c/o of loose stools. Wife reported that pt was only eating small bites. Diet has been upgraded to low fiber. Per RN, pt received immodium which helped with the diarrhea. Pt does not want to receive Resource Breeze anymore. He says that he likes ensure and wife says that he is drinking at least 2 per day.  Height: Ht Readings from Last 1 Encounters:  06/23/12 5\' 11"  (1.803 m)    Weight Status:   Wt Readings from Last 1 Encounters:  06/23/12 220 lb 11.2 oz (100.109 kg)    Re-estimated needs:  Kcal: 1950-2100 Protein: 95-120 g  Fluid: 1.9-2.1 L  Skin: Mid abdominal incision, right buttocks abrasion, back left skin tear, left buttocks incision   Diet Order: Fiber Restricted   Intake/Output Summary (Last 24 hours) at 07/21/12 1108 Last data filed at 07/21/12 0532  Gross per 24 hour  Intake   1175 ml  Output   1956 ml  Net   -781 ml    Last BM: 7/19   Labs:   Recent Labs Lab 07/16/12 0415 07/17/12 0519 07/18/12 0415 07/21/12 1028  NA 139 141 139 135  K 3.5 3.4* 3.7 3.7  CL 110 109 109 103  CO2 21 22 20 20   BUN 8 7 5* 6  CREATININE 0.99 0.99 0.98 1.03  CALCIUM 8.9 9.1 9.2 9.7  MG 2.0  --   --   --   GLUCOSE 101* 112* 110* 127*    CBG (last 3)  No results found for this basename: GLUCAP,  in the last 72 hours  Scheduled Meds: . famotidine  20 mg Oral Daily  . feeding supplement  237 mL Oral TID WC  . feeding  supplement  1 Container Oral Q24H  . heparin subcutaneous  5,000 Units Subcutaneous Q8H  . hydrocortisone   Rectal TID  . metoCLOPramide (REGLAN) injection  10 mg Intravenous TID AC & HS  . metoprolol  5 mg Intravenous Q6H  . ondansetron  4 mg Intravenous Q4H  . potassium chloride  40 mEq Oral TID  . vancomycin  250 mg Oral Q6H    Continuous Infusions: . dextrose 5 % and 0.45 % NaCl with KCl 20 mEq/L 50 mL/hr at 07/21/12 0522    Ebbie Latus RD, LDN

## 2012-07-21 NOTE — Progress Notes (Signed)
Pt with very frequent watery stools. Pt up to [facility] almost every 30 mins with some incontinence due to inability to get to [facility] quick enough. Bottom is very excoriated, red, and tender from constant moisture from stools. Pt is tired and worn out from the constant up and down from the diarrhea. Called doctor on call for an imodium order. Gave a one time dose of 2 capsules. Pt's diarrhea was relieved and was able to rest and sleep all night.   Jaxn Chiquito, Ok Edwards RN

## 2012-07-21 NOTE — Progress Notes (Signed)
Pt has had a much better day today, no BMs this shift, amb in hall and did great though it exhausted him.  Has tried to eat but still only tolerating bites.  Has taken one dose of Reglan at suppertime. Bowel sounds still very active.

## 2012-07-22 ENCOUNTER — Inpatient Hospital Stay (HOSPITAL_COMMUNITY): Payer: BC Managed Care – PPO

## 2012-07-22 MED ORDER — IOHEXOL 300 MG/ML  SOLN
25.0000 mL | INTRAMUSCULAR | Status: AC
  Administered 2012-07-22 (×2): 25 mL via ORAL

## 2012-07-22 NOTE — Progress Notes (Signed)
Report received from K. O'Berry, RN. No change from initial am assessment. Patient resting. Will continue to monitor and follow the plan of care.

## 2012-07-22 NOTE — Progress Notes (Signed)
10 Days Post-Op  Subjective: Pelvic hematoma drain placed 7/14 Doing better Still with significant diarrhea. Nauseated this am, trying to drink contrast for repeat CT  Objective: Vital signs in last 24 hours: Temp:  [97.7 F (36.5 C)-98.1 F (36.7 C)] 98 F (36.7 C) (07/21 0549) Pulse Rate:  [65-80] 65 (07/21 0549) Resp:  [16-20] 16 (07/21 0549) BP: (122-141)/(75-89) 141/89 mmHg (07/21 0549) SpO2:  [99 %-100 %] 99 % (07/21 0549) Last BM Date: 07/20/12  Intake/Output from previous day: 07/20 0701 - 07/21 0700 In: 1575 [P.O.:360; I.V.:1200] Out: 2005 [Urine:1975; Drains:30] Intake/Output this shift:    PE:  Afeb; vss Output dark blood 30 cc yesterday; 20 cc now in bulb Cx: negative Site clean and dry, NT  Lab Results:  No results found for this basename: WBC, HGB, HCT, PLT,  in the last 72 hours BMET  Recent Labs  07/21/12 1028  NA 135  K 3.7  CL 103  CO2 20  GLUCOSE 127*  BUN 6  CREATININE 1.03  CALCIUM 9.7      Assessment/Plan:  Pelvic hematoma drain intact Will follow Repeat CT today  LOS: 30 days    Brayton El 07/22/2012

## 2012-07-22 NOTE — Progress Notes (Signed)
Calorie count envelope has been hung on the patient's bathroom door. Document percent consumed for each item on the patient's meal tray ticket and keep in envelope. Also document percent of any supplement or snack pt consumes and keep documentation in envelope for RD to review. Thank you. Ian Malkin RD, LDN Inpatient Clinical Dietitian Pager: [phone] After Hours Pager: [phone]

## 2012-07-22 NOTE — Progress Notes (Signed)
PT Cancellation Note  Patient Details Name: Chad Olson MRN: [id number] DOB: Apr 05, 1945   Cancelled Treatment:    Reason Eval/Treat Not Completed: Medical issues which prohibited therapy-nausea, diarrhea. Family requested PT check back on tomorrow. Thanks.    Rebeca Alert, MPT Pager: [phone]

## 2012-07-22 NOTE — Progress Notes (Signed)
CSW continues to follow for any needs, though it is likely that patient will return home at this point.  Aram Domzalski C. Advay Volante MSW, LCSW [phone]

## 2012-07-22 NOTE — Progress Notes (Signed)
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 Healed well initially and sent home 6/17. Loopogram 6/27 without internal leak.  2 - Fever / h/o Bacteruria - Pt with MRSA in urine by hospital UCX 03/2012 and again subsequently in blood. Now on Vancomycin daily per ID x 4-6 week total (nearing end of course). Most recent UCX, BCX negative, New set pending from 6/22. Pt readmitted with low-grade fever 6/22 to 100.7 and malaise, no sig leukocytosis. Now on Vanc + Zosyn emirically. UCX with yeast (trated diflucan x 3 days), BCX no growth to date.Pelvic fluid aspirate and no growth / final.  ID now following,and DC'd all ABX given eval except for current C. Diff treatment which finished and now CDiff negative 7/18.  3 - Heme / Recent DVT + PE -incidental PE by CT 03/2012 now on Lovenox, DVT resolved by most recent LE duplex. Repeat CT-PE 6/23 without PE or active bleed from pelvis and Lovenox DC'd. Hgb drift to 7 6/24 and transfused 2 u with response to 8, transfused 2u additional 6/25 with increase to 10. Hematology consult [name]) also agrees no treatment dose anticoagulation at this point, but resume proph dosing since he is not ambulating much.  4 - Pelvic Fluid Collection - Pelvic fluid collection without rim-enhancement noted on CT from ER 6/22. Appears partially simple fluid with some dependent blood by HU analysis, no active extrav of blood or urine by imaging. IR aspiration performed 6/26 and fluid c/w hematoma only (not purulent, not simple/drainable). Repeat aspiration with insertion of 1F pigtail performed 7/14 at which time  calculated volume using ellipsoid approximation . Hematoma drain has steadily put out dark hematoma fluid and repeat imaging 7/21 with calculated volume of hematoma .  5 - Hemorrhoids / Lower GI Bleed - Long h/o hemorroids. Pt with worsened / friable bleeding hemorroids on admission that are quite bothersome. No prior specific intervention, likely exacerbated by pelvic fluid collection / hematoma. Gen Surg recs conservative measures and now nearly resolved clinically.  6 - Nutrition / Nausea / C. Difficile- Pt with some element of baseline GI issues with GERD and prior small esophageal polyp. Has had exacerbation of nausea in house and not meeting nutritional goals. GI involved and initial EGD + KUB with ileus / gastroparesis picture. While ileus resolving placed on TPN and NGT for symptom relief and to bridge him with nutrition which was resumed 7/11 following successful decompression with rectal tube place with aide of sigmoidoscopy. C. Diff found positive 7/5 and started on dual therapy with flagyl + vanc enemas with plan for 14 day course which has ended. Rectal tube DC'd 7/17. Pt still with refractory nausea without emesis and new CT 7/21 without sig ileus patter or clear obstruction.  7 - Disposition - PT eval has recommended DC to SNF when appropriate and family amenable.  PMH sig for obesity and left knee replacement, PE + DVT (now resolved). No CV disease.   Today Kaidence complains of persistent nausea without emesis that impairs his PO intake.   Objective: Vital signs in last 24 hours: Temp:  [97 F (36.1 C)-98.1 F (36.7 C)] 97 F (36.1 C) (07/21 1605) Pulse Rate:  [65-87] 87 (07/21 1605) Resp:  [16-20] 20 (07/21 1605) BP: (131-141)/(75-92) 132/92  mmHg (07/21 1605) SpO2:  [99 %-100 %] 100 % (07/21 1605) Last BM Date: 07/20/12  Intake/Output from previous day: 07/20 0701 - 07/21 0700 In: 1575 [P.O.:360; I.V.:1200] Out: 2005 [Urine:1975; Drains:30] Intake/Output this  shift: Total I/O In: 400 [I.V.:400] Out: 900 [Urine:900]  General appearance: alert, cooperative, appears stated age and family at bedside Head: Normocephalic, without obvious abnormality, atraumatic Eyes: conjunctivae/corneas clear. PERRL, EOM's intact. Fundi benign. Ears: normal TM's and external ear canals both ears Nose: Nares normal. Septum midline. Mucosa normal. No drainage or sinus tenderness. Throat: lips, mucosa, and tongue normal; teeth and gums normal Neck: no adenopathy, no carotid bruit, no JVD, supple, symmetrical, trachea midline and thyroid not enlarged, symmetric, no tenderness/mass/nodules Back: symmetric, no curvature. ROM normal. No CVA tenderness. Resp: clear to auscultation bilaterally Chest wall: no tenderness Cardio: regular rate and rhythm, S1, S2 normal, no murmur, click, rub or gallop GI: soft, non-tender; bowel sounds normal; no masses,  no organomegaly Male genitalia: normal Extremities: extremities normal, atraumatic, no cyanosis or edema and RUE PICC c/d/i. Pulses: 2+ and symmetric Skin: Skin color, texture, turgor normal. No rashes or lesions Lymph nodes: Cervical, supraclavicular, and axillary nodes normal. Neurologic: Grossly normal Incision/Wound: RLQ urostomy pink / patent. Hematoma drain with dark hematoma fluid. No ecchymoses.   Lab Results:  No results found for this basename: WBC, HGB, HCT, PLT,  in the last 72 hours BMET  Recent Labs  07/21/12 1028  NA 135  K 3.7  CL 103  CO2 20  GLUCOSE 127*  BUN 6  CREATININE 1.03  CALCIUM 9.7   PT/INR No results found for this basename: LABPROT, INR,  in the last 72 hours ABG No results found for this basename: PHART, PCO2, PO2, HCO3,  in the last 72 hours  Studies/Results: Ct Abdomen Pelvis Wo Contrast  07/22/2012   *RADIOLOGY REPORT*  Clinical Data: Pelvic hematoma  CT ABDOMEN AND PELVIS WITHOUT CONTRAST  Technique:  Multidetector CT imaging of the abdomen and pelvis was performed  following the standard protocol without intravenous contrast.  Comparison: 07/09/2012  Findings: Left transgluteal pigtail drain has been placed into the pelvic hematoma. Hematoma now measures 6.3 x 5.5 x 12.4 cm. Previously, this measured 8.2 x 15.9 x 9.0 cm based on my direct measurements.  It has therefore markedly reduced in size.  The drain remains appropriately positioned.  There is slight kinking of the drain as it passes through the gluteal musculature.  Gaseous distention has markedly improved.  Small bowel distention has virtually resolved.  Colonic distention is minimal.  Oral contrast is seen throughout small bowel to the ileum.  A small amount of oral contrast is present in the cecum.  Normal appendix.  The left nephrostomy and bilateral Nephro ureteral catheters are stable mild right hydronephrosis is worse.  High-density material in the gallbladder likely represents gallstones.  Unenhanced spleen, liver, pancreas, adrenal glands are within normal limits.  No free intraperitoneal gas.  No free fluid in the peritoneal space.  Bilateral L5 pars defects.  Otherwise stable study.  IMPRESSION: The pelvic hematoma has markedly reduced in size after drainage.  Markedly reduced bowel distention as described.  Small bowel distention has resolved and there is minimal colonic distention. Oral contrast has reached the cecum.   Original Report Authenticated By: Jolaine Click, M.D.    Anti-infectives: Anti-infectives   Start     Dose/Rate Route Frequency Ordered Stop   07/11/12 1200  vancomycin (VANCOCIN) 50 mg/mL oral solution 250 mg  250 mg Oral 4 times per day 07/11/12 1013     07/06/12 1800  vancomycin (VANCOCIN) 500 mg in sodium chloride irrigation 0.9 % 100 mL ENEMA  Status:  Discontinued     500 mg Rectal 4 times per day 07/06/12 1335 07/11/12 1244   07/06/12 1430  metroNIDAZOLE (FLAGYL) IVPB 500 mg  Status:  Discontinued     500 mg 100 mL/hr over 60 Minutes Intravenous 3 times per day 07/06/12  1335 07/14/12 0725   06/25/12 1000  vancomycin (VANCOCIN) 1,250 mg in sodium chloride 0.9 % 250 mL IVPB  Status:  Discontinued     1,250 mg 166.7 mL/hr over 90 Minutes Intravenous Every 12 hours 06/25/12 0856 06/28/12 1102   06/24/12 2000  fluconazole (DIFLUCAN) IVPB 200 mg     200 mg 100 mL/hr over 60 Minutes Intravenous Every 24 hours 06/24/12 1733 06/26/12 2322   06/23/12 0900  vancomycin (VANCOCIN) 1,250 mg in sodium chloride 0.9 % 250 mL IVPB     1,250 mg 166.7 mL/hr over 90 Minutes Intravenous Every 12 hours 06/23/12 0522 06/24/12 2231   06/23/12 0600  piperacillin-tazobactam (ZOSYN) IVPB 3.375 g  Status:  Discontinued     3.375 g 12.5 mL/hr over 240 Minutes Intravenous 3 times per day 06/23/12 0447 06/28/12 1102   06/23/12 0330  cefTRIAXone (ROCEPHIN) 1 g in dextrose 5 % 50 mL IVPB     1 g 100 mL/hr over 30 Minutes Intravenous  Once 06/23/12 0319 06/23/12 0440      Assessment/Plan:  1 - Bladder Cancer / Cystectomy / Ureteral Revision- NED form cancer perspective.Loopogram without leak this admission. WIll consider  left neph tube removal under fluoro later this admission when back to baseline in terms of GI status.  2 - Fever / h/o Bacteruria - Now off wound-specific ABX given negative CX from blood, urine, pelvic fluid.  3 - Heme / Recent DVT + PE -Lovenox now stopped. SCD's in place.  . PT following. Remain proph SQ heparin + SCD's.   4 - Pelvic Fluid Collection - Imaging and aspirate c/w non-infected hemaotma. Hematoma reimaging 7/21 with approx 60%+ volume reduction, would keep drain in place as long as putting out fluid to hasten resolution.  5 - Hemorrhoids / Lower GI Bleed - Agree with conservative measures for now. Hgb stable.  6 - Nutrition / Nausea / C. Difficile- Remains central active issue. CDiff treatment now completed and negative post-treatment. Imaging with decreased ileus / obstructive component but still with nutrition-limiting symptoms. Will obtain CMP in AM  and re-consult GI.  7 - Disposition - SNF pending nutritional status and ileus resolution.  Greatly appreciate gen surgery, PT, GI  input.  [facility], Della Scrivener 07/22/2012

## 2012-07-22 NOTE — Progress Notes (Signed)
NUTRITION FOLLOW UP / Consult  Intervention:   Continue Ensure Complete TID Provide a snack once daily Continue to encourage PO intake Calorie Count  Nutrition Dx:   Inadequate oral intake related to diet order as evidenced by clear liquid diet; ongoing, diet advanced to low fiber, but still inadequate PO intake  Goal:   Pt to meet >/= 90% of their estimated nutrition needs; not met  Monitor:   Weights; no new wt PO intake; poor Bowel movements; 7/19 Labs; low BUN, low GFR  Assessment:   Pt complains of diarrhea for past week and states he has no desire to eat. Pt has been drinking 2 Ensure supplements most days but, otherwise reports only taking a few bites of food. Calorie Count has been initiated. Encouraged pt to continue drinking Ensure and takes bites of food as often as tolerated.   Height: Ht Readings from Last 1 Encounters:  06/23/12 5\' 11"  (1.803 m)    Weight Status:   Wt Readings from Last 1 Encounters:  06/23/12 220 lb 11.2 oz (100.109 kg)    Re-estimated needs:  Kcal: 1950-2100 Protein: 95-120 g  Fluid: 1.9-2.1 L  Skin: incision on buttocks, mid abdomen; abrasions on buttocks  Diet Order: Fiber Restricted   Intake/Output Summary (Last 24 hours) at 07/22/12 1147 Last data filed at 07/22/12 0700  Gross per 24 hour  Intake   1515 ml  Output   1602 ml  Net    -87 ml    Last BM: 7/19   Labs:   Recent Labs Lab 07/16/12 0415 07/17/12 0519 07/18/12 0415 07/21/12 1028  NA 139 141 139 135  K 3.5 3.4* 3.7 3.7  CL 110 109 109 103  CO2 21 22 20 20   BUN 8 7 5* 6  CREATININE 0.99 0.99 0.98 1.03  CALCIUM 8.9 9.1 9.2 9.7  MG 2.0  --   --   --   GLUCOSE 101* 112* 110* 127*    CBG (last 3)  No results found for this basename: GLUCAP,  in the last 72 hours  Scheduled Meds: . famotidine  20 mg Oral Daily  . feeding supplement  237 mL Oral TID WC  . heparin subcutaneous  5,000 Units Subcutaneous Q8H  . hydrocortisone   Rectal TID  . metoprolol   5 mg Intravenous Q6H  . ondansetron  4 mg Intravenous Q4H  . potassium chloride  40 mEq Oral TID  . vancomycin  250 mg Oral Q6H    Continuous Infusions: . dextrose 5 % and 0.45 % NaCl with KCl 20 mEq/L 50 mL/hr at 07/22/12 0153    Ian Malkin RD, LDN Inpatient Clinical Dietitian Pager: [phone] After Hours Pager: [phone]

## 2012-07-23 DIAGNOSIS — R630 Anorexia: Secondary | ICD-10-CM | POA: Diagnosis not present

## 2012-07-23 LAB — COMPREHENSIVE METABOLIC PANEL WITH GFR
ALT: 17 U/L (ref 0–53)
AST: 18 U/L (ref 0–37)
Albumin: 3.1 g/dL — ABNORMAL LOW (ref 3.5–5.2)
Alkaline Phosphatase: 85 U/L (ref 39–117)
BUN: 6 mg/dL (ref 6–23)
CO2: 22 meq/L (ref 19–32)
Calcium: 9.8 mg/dL (ref 8.4–10.5)
Chloride: 107 meq/L (ref 96–112)
Creatinine, Ser: 1.08 mg/dL (ref 0.50–1.35)
GFR calc Af Amer: 81 mL/min — ABNORMAL LOW (ref >90)
GFR calc non Af Amer: 70 mL/min — ABNORMAL LOW (ref >90)
Glucose, Bld: 107 mg/dL — ABNORMAL HIGH (ref 70–99)
Potassium: 4.1 meq/L (ref 3.5–5.1)
Sodium: 137 meq/L (ref 135–145)
Total Bilirubin: 0.5 mg/dL (ref 0.3–1.2)
Total Protein: 6.6 g/dL (ref 6.0–8.3)

## 2012-07-23 MED ORDER — NYSTATIN 100000 UNIT/ML MT SUSP
5.0000 mL | Freq: Four times a day (QID) | OROMUCOSAL | Status: AC
  Administered 2012-07-23 – 2012-07-24 (×3): 500000 [IU] via ORAL
  Filled 2012-07-23 (×12): qty 5

## 2012-07-23 NOTE — Progress Notes (Signed)
[facility] Progress Note  Subjective:  Dr. Berneice Heinrich call us back today to reassess this patient.  When we last saw him, he was doing well and tolerating full liquid diet.  Was passing stool around rectal tube, indicating that the compression on the rectum was lessening/opening up.  Rectal tube was pulled, however, Dr. Berneice Heinrich states that patient has not been doing as well since it was removed.  Having some recurrent nausea and unable to maintain oral nutrition.  Dr.  Berneice Heinrich is just asking for some input...should we replace rectal tube?  Or any other thoughts on situation.  Repeat CT scan shows significant decrease in size of pelvic hematoma, about 70% reduced.  Also, shows markedly reduced bowel distention with small bowel distention completely resolved and minimal colonic distention; contrast reached cecum.  Objective:  Vital signs in last 24 hours: Temp:  [97 F (36.1 C)-97.8 F (36.6 C)] 97.8 F (36.6 C) (07/22 0506) Pulse Rate:  [70-97] 70 (07/22 0506) Resp:  [16-20] 16 (07/22 0506) BP: (116-132)/(70-92) 116/81 mmHg (07/22 0506) SpO2:  [98 %-100 %] 98 % (07/22 0506) Last BM Date: 07/22/12 General:   Alert, Well-developed, in NAD Heart:  Regular rate and rhythm; no murmurs Pulm:  CTAB.  No W/R/R. Abdomen:  Soft, non-distended.  BS present.  Some suprapubic and right sided TTP without R/R/G. Extremities:  Without edema. Neurologic:  Alert and  oriented x4;  grossly normal neurologically. Psych:  Alert and cooperative. Normal mood and affect.  BMET  Recent Labs  07/21/12 1028 07/23/12 0431  NA 135 137  K 3.7 4.1  CL 103 107  CO2 20 22  GLUCOSE 127* 107*  BUN 6 6  CREATININE 1.03 1.08  CALCIUM 9.7 9.8   LFT  Recent Labs  07/23/12 0431  PROT 6.6  ALBUMIN 3.1*  AST 18  ALT 17  ALKPHOS 85  BILITOT 0.5   Ct Abdomen Pelvis Wo Contrast  07/22/2012   *RADIOLOGY REPORT*  Clinical Data: Pelvic hematoma  CT ABDOMEN AND PELVIS WITHOUT CONTRAST  Technique:   Multidetector CT imaging of the abdomen and pelvis was performed following the standard protocol without intravenous contrast.  Comparison: 07/09/2012  Findings: Left transgluteal pigtail drain has been placed into the pelvic hematoma. Hematoma now measures 6.3 x 5.5 x 12.4 cm. Previously, this measured 8.2 x 15.9 x 9.0 cm based on my direct measurements.  It has therefore markedly reduced in size.  The drain remains appropriately positioned.  There is slight kinking of the drain as it passes through the gluteal musculature.  Gaseous distention has markedly improved.  Small bowel distention has virtually resolved.  Colonic distention is minimal.  Oral contrast is seen throughout small bowel to the ileum.  A small amount of oral contrast is present in the cecum.  Normal appendix.  The left nephrostomy and bilateral Nephro ureteral catheters are stable mild right hydronephrosis is worse.  High-density material in the gallbladder likely represents gallstones.  Unenhanced spleen, liver, pancreas, adrenal glands are within normal limits.  No free intraperitoneal gas.  No free fluid in the peritoneal space.  Bilateral L5 pars defects.  Otherwise stable study.  IMPRESSION: The pelvic hematoma has markedly reduced in size after drainage.  Markedly reduced bowel distention as described.  Small bowel distention has resolved and there is minimal colonic distention. Oral contrast has reached the cecum.   Original Report Authenticated By: Jolaine Click, M.D.    Assessment / Plan: -67 y.o. male with rectal obstruction/ischemia from 13cm pelvic  hematoma, deconditioning, Cdiff infection (recent repeat Cdiff negative), bladder cancer s/p several urinary surgeries/complications.   S/p transgluteal drain placed into the liquifying hematoma by IR on 7/14 with about 70% reduction in size.  Rectal tube was removed last week, but recurrent nausea and inability to maintain oral nutrition since that time.  CT scan shows much  improvement/resolution in bowel dilation.  Will discuss with Dr. Leone Payor, but I think that repeat flex sig would not be a bad idea to assess how the rectum looks at this point.  ? Need to replace the rectal tube for some time.     LOS: 31 days   ZEHR, JESSICA D.  07/23/2012, 9:07 AM  Pager number [phone]   [facility] Attending  I have also seen and assessed the patient and agree with the above note. I have discussed plans with patient. He is moving bowels so does not seem obstructed but could have a stricture from the ischemia. He and I both think reactive depression may be at play also.   Iva Boop, MD, [facility] [phone] (pager) 07/23/2012 7:45 PM

## 2012-07-23 NOTE — Progress Notes (Signed)
c diff negative and moved to clean room..[id number]

## 2012-07-23 NOTE — Progress Notes (Signed)
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 Healed well initially and sent home 6/17. Loopogram 6/27 without internal leak.  2 - Fever / h/o Bacteruria - Pt with MRSA in urine by hospital UCX 03/2012 and again subsequently in blood. Now on Vancomycin daily per ID x 4-6 week total (nearing end of course). Most recent UCX, BCX negative, New set pending from 6/22. Pt readmitted with low-grade fever 6/22 to 100.7 and malaise, no sig leukocytosis. Now on Vanc + Zosyn emirically. UCX with yeast (trated diflucan x 3 days), BCX no growth to date.Pelvic fluid aspirate and no growth / final.  ID now following,and DC'd all ABX given eval except for current C. Diff treatment which finished and now CDiff negative 7/18.  3 - Heme / Recent DVT + PE -incidental PE by CT 03/2012 now on Lovenox, DVT resolved by most recent LE duplex. Repeat CT-PE 6/23 without PE or active bleed from pelvis and Lovenox DC'd. Hgb drift to 7 6/24 and transfused 2 u with response to 8, transfused 2u additional 6/25 with increase to 10. Hematology consult [name]) also agrees no treatment dose anticoagulation at this point, but resume proph dosing since he is not ambulating much.  4 - Pelvic Fluid Collection - Pelvic fluid collection without rim-enhancement noted on CT from ER 6/22. Appears partially simple fluid with some dependent blood by HU analysis, no active extrav of blood or urine by imaging. IR aspiration performed 6/26 and fluid c/w hematoma only (not purulent, not simple/drainable). Repeat aspiration with insertion of 31F pigtail performed 7/14 at  which time calculated volume using ellipsoid approximation . Hematoma drain has steadily put out dark hematoma fluid and repeat imaging 7/21 with calculated volume of hematoma .  5 - Hemorrhoids / Lower GI Bleed - Long h/o hemorroids. Pt with worsened / friable bleeding hemorroids on admission that are quite bothersome. No prior specific intervention, likely exacerbated by pelvic fluid collection / hematoma. Gen Surg recs conservative measures and now nearly resolved clinically.  6 - Nutrition / Nausea / C. Difficile- Pt with some element of baseline GI issues with GERD and prior small esophageal polyp. Has had exacerbation of nausea in house and not meeting nutritional goals. GI involved and initial EGD + KUB with ileus / gastroparesis picture. While ileus resolving placed on TPN and NGT for symptom relief and to bridge him with nutrition which was resumed 7/11 following successful decompression with rectal tube place with aide of sigmoidoscopy. C. Diff found positive 7/5 and started on dual therapy with flagyl + vanc enemas with plan for 14 day course which has ended. Rectal tube DC'd 7/17. Pt still with refractory nausea without emesis and new CT 7/21 without sig ileus patter or clear obstruction.  7 - Disposition - PT eval has recommended DC to SNF when appropriate and family amenable.  PMH sig for obesity and left knee replacement, PE + DVT (now resolved). No CV disease.   Today [place] complains of persistent nausea without emesis that impairs his PO intake. Tolerating hematoma drain better and it continues to have about 30cc / day output.   Objective: Vital signs in last 24 hours: Temp:  [97 F (36.1 C)-97.8 F (36.6 C)] 97.8 F (36.6  C) (07/22 0506) Pulse Rate:  [70-97] 70 (07/22 0506) Resp:  [16-20] 16 (07/22 0506) BP: (116-132)/(70-92) 116/81 mmHg (07/22 0506) SpO2:  [98 %-100 %] 98 % (07/22 0506) Last BM Date: 07/22/12  Intake/Output from previous day: 07/21 0701 - 07/22  0700 In: 1000 [I.V.:1000] Out: 2535 [Urine:2525; Drains:10] Intake/Output this shift:    General appearance: alert, cooperative, appears stated age and wife and family at bedside Head: Normocephalic, without obvious abnormality, atraumatic Eyes: conjunctivae/corneas clear. PERRL, EOM's intact. Fundi benign. Ears: normal TM's and external ear canals both ears Nose: Nares normal. Septum midline. Mucosa normal. No drainage or sinus tenderness. Throat: lips, mucosa, and tongue normal; teeth and gums normal Neck: no adenopathy, no carotid bruit, no JVD, supple, symmetrical, trachea midline and thyroid not enlarged, symmetric, no tenderness/mass/nodules Back: symmetric, no curvature. ROM normal. No CVA tenderness. Resp: clear to auscultation bilaterally Chest wall: no tenderness Cardio: regular rate and rhythm, S1, S2 normal, no murmur, click, rub or gallop GI: soft, non-tender; bowel sounds normal; no masses,  no organomegaly Male genitalia: normal Extremities: extremities normal, atraumatic, no cyanosis or edema and RUE PICC c/d/i. Pulses: 2+ and symmetric Skin: Skin color, texture, turgor normal. No rashes or lesions Lymph nodes: Cervical, supraclavicular, and axillary nodes normal. Neurologic: Grossly normal Incision/Wound: RLQ Urostomy pink / patent with bander stents in place. Recent incision sites c/d/i. No rebound / guarding.  Lab Results:  No results found for this basename: WBC, HGB, HCT, PLT,  in the last 72 hours BMET  Recent Labs  07/21/12 1028 07/23/12 0431  NA 135 137  K 3.7 4.1  CL 103 107  CO2 20 22  GLUCOSE 127* 107*  BUN 6 6  CREATININE 1.03 1.08  CALCIUM 9.7 9.8   PT/INR No results found for this basename: LABPROT, INR,  in the last 72 hours ABG No results found for this basename: PHART, PCO2, PO2, HCO3,  in the last 72 hours  Studies/Results: Ct Abdomen Pelvis Wo Contrast  07/22/2012   *RADIOLOGY REPORT*  Clinical Data: Pelvic hematoma  CT ABDOMEN AND  PELVIS WITHOUT CONTRAST  Technique:  Multidetector CT imaging of the abdomen and pelvis was performed following the standard protocol without intravenous contrast.  Comparison: 07/09/2012  Findings: Left transgluteal pigtail drain has been placed into the pelvic hematoma. Hematoma now measures 6.3 x 5.5 x 12.4 cm. Previously, this measured 8.2 x 15.9 x 9.0 cm based on my direct measurements.  It has therefore markedly reduced in size.  The drain remains appropriately positioned.  There is slight kinking of the drain as it passes through the gluteal musculature.  Gaseous distention has markedly improved.  Small bowel distention has virtually resolved.  Colonic distention is minimal.  Oral contrast is seen throughout small bowel to the ileum.  A small amount of oral contrast is present in the cecum.  Normal appendix.  The left nephrostomy and bilateral Nephro ureteral catheters are stable mild right hydronephrosis is worse.  High-density material in the gallbladder likely represents gallstones.  Unenhanced spleen, liver, pancreas, adrenal glands are within normal limits.  No free intraperitoneal gas.  No free fluid in the peritoneal space.  Bilateral L5 pars defects.  Otherwise stable study.  IMPRESSION: The pelvic hematoma has markedly reduced in size after drainage.  Markedly reduced bowel distention as described.  Small bowel distention has resolved and there is minimal colonic distention. Oral contrast has reached the cecum.   Original Report Authenticated By: Jolaine Click, M.D.    Anti-infectives: Anti-infectives  Start     Dose/Rate Route Frequency Ordered Stop   07/11/12 1200  vancomycin (VANCOCIN) 50 mg/mL oral solution 250 mg     250 mg Oral 4 times per day 07/11/12 1013     07/06/12 1800  vancomycin (VANCOCIN) 500 mg in sodium chloride irrigation 0.9 % 100 mL ENEMA  Status:  Discontinued     500 mg Rectal 4 times per day 07/06/12 1335 07/11/12 1244   07/06/12 1430  metroNIDAZOLE (FLAGYL) IVPB 500  mg  Status:  Discontinued     500 mg 100 mL/hr over 60 Minutes Intravenous 3 times per day 07/06/12 1335 07/14/12 0725   06/25/12 1000  vancomycin (VANCOCIN) 1,250 mg in sodium chloride 0.9 % 250 mL IVPB  Status:  Discontinued     1,250 mg 166.7 mL/hr over 90 Minutes Intravenous Every 12 hours 06/25/12 0856 06/28/12 1102   06/24/12 2000  fluconazole (DIFLUCAN) IVPB 200 mg     200 mg 100 mL/hr over 60 Minutes Intravenous Every 24 hours 06/24/12 1733 06/26/12 2322   06/23/12 0900  vancomycin (VANCOCIN) 1,250 mg in sodium chloride 0.9 % 250 mL IVPB     1,250 mg 166.7 mL/hr over 90 Minutes Intravenous Every 12 hours 06/23/12 0522 06/24/12 2231   06/23/12 0600  piperacillin-tazobactam (ZOSYN) IVPB 3.375 g  Status:  Discontinued     3.375 g 12.5 mL/hr over 240 Minutes Intravenous 3 times per day 06/23/12 0447 06/28/12 1102   06/23/12 0330  cefTRIAXone (ROCEPHIN) 1 g in dextrose 5 % 50 mL IVPB     1 g 100 mL/hr over 30 Minutes Intravenous  Once 06/23/12 0319 06/23/12 0440      Assessment/Plan:  1 - Bladder Cancer / Cystectomy / Ureteral Revision- NED form cancer perspective.Loopogram without leak this admission. WIll consider  left neph tube removal under fluoro later this admission when back to baseline in terms of GI status.  2 - Fever / h/o Bacteruria - Now off wound-specific ABX given negative CX from blood, urine, pelvic fluid.  3 - Heme / Recent DVT + PE -Lovenox now stopped. SCD's in place.  . PT following. Remain proph SQ heparin + SCD's.   4 - Pelvic Fluid Collection - Imaging and aspirate c/w non-infected hemaotma. Hematoma reimaging 7/21 with approx 60%+ volume reduction, would keep drain in place as long as putting out fluid to hasten resolution.  5 - Hemorrhoids / Lower GI Bleed - Agree with conservative measures for now. Hgb stable.  6 - Nutrition / Nausea / C. Difficile- Remains central active issue. CDiff treatment now completed and negative post-treatment. Imaging with  decreased ileus / obstructive component but still with nutrition-limiting symptoms. CMP today w/o elevated LFT's. Exam with possible mild thrush. Will RX 3 days Nystatin PO solution and re-consult GI.  7 - Disposition - SNF pending nutritional status and ileus resolution.  Greatly appreciate gen surgery, PT, GI  input.  [facility], Danniel Tones 07/23/2012

## 2012-07-23 NOTE — Progress Notes (Signed)
11 Days Post-Op  Subjective: Pt with persistent loose stools, diminished appetite, occ N/V  Objective: Vital signs in last 24 hours: Temp:  [97 F (36.1 C)-97.8 F (36.6 C)] 97.8 F (36.6 C) (07/22 0506) Pulse Rate:  [70-97] 70 (07/22 0506) Resp:  [16-20] 16 (07/22 0506) BP: (116-132)/(70-92) 116/81 mmHg (07/22 0506) SpO2:  [98 %-100 %] 98 % (07/22 0506) Last BM Date: 07/22/12  Intake/Output from previous day: 07/21 0701 - 07/22 0700 In: 1000 [I.V.:1000] Out: 2535 [Urine:2525; Drains:10] Intake/Output this shift:    TG drain intact, output about 10 -20 cc's today , fluid cx's /cyt neg; insertion site mildly tender  Lab Results:  No results found for this basename: WBC, HGB, HCT, PLT,  in the last 72 hours BMET  Recent Labs  07/21/12 1028 07/23/12 0431  NA 135 137  K 3.7 4.1  CL 103 107  CO2 20 22  GLUCOSE 127* 107*  BUN 6 6  CREATININE 1.03 1.08  CALCIUM 9.7 9.8   PT/INR No results found for this basename: LABPROT, INR,  in the last 72 hours ABG No results found for this basename: PHART, PCO2, PO2, HCO3,  in the last 72 hours Results for orders placed during the hospital encounter of 06/22/12  CULTURE, BLOOD (ROUTINE X 2)     Status: None   Collection Time    06/22/12 10:43 PM      Result Value Range Status   Specimen Description BLOOD LEFT ANTECUBITAL   Final   Special Requests BOTTLES DRAWN AEROBIC AND ANAEROBIC 5CC   Final   Culture  Setup Time 06/23/2012 17:47   Final   Culture NO GROWTH 5 DAYS   Final   Report Status 06/29/2012 FINAL   Final  CULTURE, BLOOD (ROUTINE X 2)     Status: None   Collection Time    06/22/12 10:57 PM      Result Value Range Status   Specimen Description BLOOD LEFT ARM   Final   Special Requests BOTTLES DRAWN AEROBIC AND ANAEROBIC 10CC   Final   Culture  Setup Time 06/23/2012 17:47   Final   Culture NO GROWTH 5 DAYS   Final   Report Status 06/29/2012 FINAL   Final  URINE CULTURE     Status: None   Collection Time     06/23/12  2:17 AM      Result Value Range Status   Specimen Description URINE, CATHETERIZED   Final   Special Requests Normal   Final   Culture  Setup Time 06/23/2012 15:21   Final   Colony Count 35,000 COLONIES/ML   Final   Culture YEAST   Final   Report Status 06/24/2012 FINAL   Final  MRSA PCR SCREENING     Status: None   Collection Time    06/24/12  9:58 AM      Result Value Range Status   MRSA by PCR NEGATIVE  NEGATIVE Final   Comment:            The GeneXpert MRSA Assay (FDA     approved for NASAL specimens     only), is one component of a     comprehensive MRSA colonization     surveillance program. It is not     intended to diagnose MRSA     infection nor to guide or     monitor treatment for     MRSA infections.  CULTURE, ROUTINE-ABSCESS     Status: None   Collection  Time    06/27/12 12:51 PM      Result Value Range Status   Specimen Description ABSCESS PELVIC   Final   Special Requests NONE   Final   Gram Stain     Final   Value: MODERATE WBC PRESENT, PREDOMINANTLY PMN     NO SQUAMOUS EPITHELIAL CELLS SEEN     NO ORGANISMS SEEN   Culture NO GROWTH 3 DAYS   Final   Report Status 12/04/202014 FINAL   Final  CLOSTRIDIUM DIFFICILE BY PCR     Status: Abnormal   Collection Time    07/06/12 10:05 AM      Result Value Range Status   C difficile by pcr POSITIVE (*) NEGATIVE Final   Comment: CRITICAL RESULT CALLED TO, READ BACK BY AND VERIFIED WITH:     E.HARLESS,RN [id number] 07/06/12 EHOWARD  CULTURE, ROUTINE-ABSCESS     Status: None   Collection Time    07/15/12  4:55 PM      Result Value Range Status   Specimen Description PERITONEAL CAVITY   Final   Special Requests NONE   Final   Gram Stain     Final   Value: NO WBC SEEN     NO SQUAMOUS EPITHELIAL CELLS SEEN     NO ORGANISMS SEEN   Culture NO GROWTH 3 DAYS   Final   Report Status 07/18/2012 FINAL   Final  BODY FLUID CULTURE     Status: None   Collection Time    07/18/12  3:13 PM      Result Value Range Status    Specimen Description OTHER PELVIC   Final   Special Requests Normal   Final   Gram Stain     Final   Value: RARE WBC PRESENT, PREDOMINANTLY PMN     NO ORGANISMS SEEN   Culture NO GROWTH 3 DAYS   Final   Report Status 07/21/2012 FINAL   Final  CLOSTRIDIUM DIFFICILE BY PCR     Status: None   Collection Time    07/19/12  9:31 AM      Result Value Range Status   C difficile by pcr NEGATIVE  NEGATIVE Final    Studies/Results: Ct Abdomen Pelvis Wo Contrast  07/22/2012   *RADIOLOGY REPORT*  Clinical Data: Pelvic hematoma  CT ABDOMEN AND PELVIS WITHOUT CONTRAST  Technique:  Multidetector CT imaging of the abdomen and pelvis was performed following the standard protocol without intravenous contrast.  Comparison: 07/09/2012  Findings: Left transgluteal pigtail drain has been placed into the pelvic hematoma. Hematoma now measures 6.3 x 5.5 x 12.4 cm. Previously, this measured 8.2 x 15.9 x 9.0 cm based on my direct measurements.  It has therefore markedly reduced in size.  The drain remains appropriately positioned.  There is slight kinking of the drain as it passes through the gluteal musculature.  Gaseous distention has markedly improved.  Small bowel distention has virtually resolved.  Colonic distention is minimal.  Oral contrast is seen throughout small bowel to the ileum.  A small amount of oral contrast is present in the cecum.  Normal appendix.  The left nephrostomy and bilateral Nephro ureteral catheters are stable mild right hydronephrosis is worse.  High-density material in the gallbladder likely represents gallstones.  Unenhanced spleen, liver, pancreas, adrenal glands are within normal limits.  No free intraperitoneal gas.  No free fluid in the peritoneal space.  Bilateral L5 pars defects.  Otherwise stable study.  IMPRESSION: The pelvic hematoma has markedly reduced in  size after drainage.  Markedly reduced bowel distention as described.  Small bowel distention has resolved and there is minimal  colonic distention. Oral contrast has reached the cecum.   Original Report Authenticated By: Jolaine Click, M.D.    Anti-infectives: Anti-infectives   Start     Dose/Rate Route Frequency Ordered Stop   07/11/12 1200  vancomycin (VANCOCIN) 50 mg/mL oral solution 250 mg     250 mg Oral 4 times per day 07/11/12 1013     07/06/12 1800  vancomycin (VANCOCIN) 500 mg in sodium chloride irrigation 0.9 % 100 mL ENEMA  Status:  Discontinued     500 mg Rectal 4 times per day 07/06/12 1335 07/11/12 1244   07/06/12 1430  metroNIDAZOLE (FLAGYL) IVPB 500 mg  Status:  Discontinued     500 mg 100 mL/hr over 60 Minutes Intravenous 3 times per day 07/06/12 1335 07/14/12 0725   06/25/12 1000  vancomycin (VANCOCIN) 1,250 mg in sodium chloride 0.9 % 250 mL IVPB  Status:  Discontinued     1,250 mg 166.7 mL/hr over 90 Minutes Intravenous Every 12 hours 06/25/12 0856 06/28/12 1102   06/24/12 2000  fluconazole (DIFLUCAN) IVPB 200 mg     200 mg 100 mL/hr over 60 Minutes Intravenous Every 24 hours 06/24/12 1733 06/26/12 2322   06/23/12 0900  vancomycin (VANCOCIN) 1,250 mg in sodium chloride 0.9 % 250 mL IVPB     1,250 mg 166.7 mL/hr over 90 Minutes Intravenous Every 12 hours 06/23/12 0522 06/24/12 2231   06/23/12 0600  piperacillin-tazobactam (ZOSYN) IVPB 3.375 g  Status:  Discontinued     3.375 g 12.5 mL/hr over 240 Minutes Intravenous 3 times per day 06/23/12 0447 06/28/12 1102   06/23/12 0330  cefTRIAXone (ROCEPHIN) 1 g in dextrose 5 % 50 mL IVPB     1 g 100 mL/hr over 30 Minutes Intravenous  Once 06/23/12 0319 06/23/12 0440      Assessment/Plan: s/p pelvic hematoma drainage 7/14; latest CT shows decreased size of hematoma, no acute findings; cont drain for now; GI to schedule sigmoidoscopy ?tomorrow per d/w family; most recent c diff 7/18 was neg- if diarrhea persists, consider repeating, although recent oral contrast probably contributing to some extent ; other plans as per primary service  LOS: 31 days     ALLRED,D [facility] 07/23/2012

## 2012-07-23 NOTE — Progress Notes (Signed)
Calorie Count Note  48 hour calorie count ordered.  Diet: Low Fiber Supplements: Ensure Complete TID  Breakfast: none Lunch: none Dinner: 60 kcal, 0 grams of protein Supplements: none  Total intake: 60 kcal (3% of minimum estimated needs)  0 grams protein (0% of minimum estimated needs)  Assessment:  Per family in the room pt has been nauseous and vomited after receiving medications this morning. Pt has not eaten anything and the only thing he drank in the past 24 hours was water and some Gatorade.  Nutrition Dx: Inadequate oral intake related to nausea/vomiting and diarrhea as evidenced by calorie count.  Goal: Pt to meet >/= 90% of their estimated nutrition needs; not met  Intervention: Continue Ensure Complete  Recommend Florastor to help balance bacteria in GI tract Continue to encourage PO intake  Ian Malkin RD, LDN Inpatient Clinical Dietitian Pager: [phone] After Hours Pager: [phone]

## 2012-07-23 NOTE — Progress Notes (Signed)
Physical Therapy Treatment Patient Details Name: Chad Olson MRN: [id number] DOB: 03-18-1945 Today's Date: 07/23/2012 Time: 4098-1191 PT Time Calculation (min): 25 min  PT Assessment / Plan / Recommendation  History of Present Illness     Clinical Impression    PT Comments   Pt feeling weak today and present with uncontrolled loose stools.    Follow Up Recommendations  SNF     Does the patient have the potential to tolerate intense rehabilitation     Barriers to Discharge        Equipment Recommendations       Recommendations for Other Services    Frequency Min 3X/week   Progress towards PT Goals Progress towards PT goals: Progressing toward goals  Plan      Precautions / Restrictions Precautions Precautions: Fall Precaution Comments: transgluteal drain    Pertinent Vitals/Pain No c/o pain    Mobility  Bed Mobility Bed Mobility: Supine to Sit Supine to Sit: 5: Supervision;4: Min guard Details for Bed Mobility Assistance: increased time Transfers Transfers: Sit to Stand;Stand to Sit Sit to Stand: 5: Supervision;4: Min guard;From chair/3-in-1;From bed Stand to Sit: 4: Min guard;5: Supervision;To chair/3-in-1;To bed Details for Transfer Assistance: VCs hand placement Ambulation/Gait Ambulation Distance (Feet): 70 Feet (25', 20' 25' with sitting rest breaks) Assistive device: Rolling walker Ambulation/Gait Assistance Details: limited amb distance 2nd MAX c/o dizzyness and exreme "fatigue" feeling  Gait Pattern: Step-through pattern;Decreased stride length;Wide base of support;Trunk flexed Gait velocity: decreased     PT Goals (current goals can now be found in the care plan section)    Visit Information  Last PT Received On: 07/23/12 Assistance Needed: +1    Subjective Data      Cognition       Balance     End of Session PT - End of Session Equipment Utilized During Treatment: Gait belt Activity Tolerance: Patient limited by fatigue Patient  left: Other (comment) (on [facility] with family in room)   Chad Olson  PTA [facility]  Acute  Rehab Pager      [phone]

## 2012-07-24 ENCOUNTER — Encounter (HOSPITAL_COMMUNITY)
Admission: EM | Disposition: A | Discharge status: Home Health | Payer: Self-pay | Source: Home / Self Care | Attending: Urology

## 2012-07-24 ENCOUNTER — Encounter (HOSPITAL_COMMUNITY): Payer: Self-pay

## 2012-07-24 HISTORY — PX: FLEXIBLE SIGMOIDOSCOPY: SHX5431

## 2012-07-24 SURGERY — SIGMOIDOSCOPY, FLEXIBLE
Anesthesia: Moderate Sedation

## 2012-07-24 MED ORDER — MIDAZOLAM HCL 10 MG/2ML IJ SOLN
INTRAMUSCULAR | Status: AC
  Filled 2012-07-24: qty 4

## 2012-07-24 MED ORDER — ADULT MULTIVITAMIN W/MINERALS CH
1.0000 | ORAL_TABLET | Freq: Every day | ORAL | Status: DC
  Filled 2012-07-24 (×3): qty 1

## 2012-07-24 MED ORDER — SODIUM CHLORIDE 0.9 % IV SOLN: INTRAVENOUS | Status: DC

## 2012-07-24 MED ORDER — FENTANYL CITRATE 0.05 MG/ML IJ SOLN
INTRAMUSCULAR | Status: DC | PRN
  Administered 2012-07-24 (×3): 25 ug via INTRAVENOUS

## 2012-07-24 MED ORDER — MIDAZOLAM HCL 10 MG/2ML IJ SOLN
INTRAMUSCULAR | Status: DC | PRN
  Administered 2012-07-24: 1 mg via INTRAVENOUS
  Administered 2012-07-24 (×2): 2 mg via INTRAVENOUS

## 2012-07-24 MED ORDER — MIRTAZAPINE 7.5 MG PO TABS
7.5000 mg | ORAL_TABLET | Freq: Every day | ORAL | Status: DC
  Administered 2012-07-24 – 2012-08-08 (×16): 7.5 mg via ORAL
  Filled 2012-07-24 (×17): qty 1

## 2012-07-24 MED ORDER — FENTANYL CITRATE 0.05 MG/ML IJ SOLN
INTRAMUSCULAR | Status: AC
  Filled 2012-07-24: qty 4

## 2012-07-24 MED ORDER — MIRTAZAPINE 15 MG PO TBDP
7.5000 mg | ORAL_TABLET | Freq: Every day | ORAL | Status: DC
  Filled 2012-07-24: qty 0.5

## 2012-07-24 NOTE — Progress Notes (Signed)
[facility]  See flex sig report - rectum is better.  Wife asking about anorexia and diarrhea, and I explained probably multifactorial.  We will see him again tomorrow to see what tuning can be done, I am hopeful mirtazipine will help - he must be depressed.  Iva Boop, MD, [facility] [phone] (pager) 07/24/2012 10:11 AM

## 2012-07-24 NOTE — Progress Notes (Signed)
Calorie Count Note  48 hour calorie count ordered and now complete  Diet: Low Fiber Supplements: Ensure Complete  Breakfast: 30 kcal, 0 grams protein Lunch: 70 kcal, 1 gram protein Dinner: none Supplements: 350 kcal, 13 grams protein  Total intake: 450 kcal (23% of minimum estimated needs)  14 grams protein (15% of minimum estimated needs)  Notes: PO intake improved slightly from yesterday but, is still very poor. Per chart pt continues to have nausea and diarrhea.   Nutrition Dx: Inadequate oral intake related to nausea/vomiting and diarrhea as evidenced by calorie count; ongoing  Goal: Pt to meet >/= 90% of their estimated nutrition needs; not met  Intervention: Continue Ensure Complete TID Recommend Florastor prescription to help balance good bacteria in GI tract Provide Multivitamin with minerals Encourage PO intake  Ian Malkin RD, LDN Inpatient Clinical Dietitian Pager: [phone] After Hours Pager: [phone]

## 2012-07-24 NOTE — Op Note (Signed)
[facility] [address] [place], [address]   FLEXIBLE SIGMOIDOSCOPY PROCEDURE REPORT  PATIENT: Chad Olson, Chad Olson  MR#: [id number] BIRTHDATE: [birth year] , 67  yrs. old GENDER: Male ENDOSCOPIST: Iva Boop, MD, [facility] PROCEDURE DATE:  07/24/2012 PROCEDURE:   Sigmoidoscopy, diagnostic ASA CLASS:   Class III INDICATIONS:follow-up rectal inflammation. MEDICATIONS: Fentanyl 75 mcg IV and Versed 5 mg IV  DESCRIPTION OF PROCEDURE:   After the risks benefits and alternatives of the procedure were thoroughly explained, informed consent was obtained.  revealed no abnormalities of the rectum. The endoscope was introduced through the anus  and advanced to the sigmoid colon , limited by No adverse events experienced.   The quality of the prep was fair .  The instrument was then slowly withdrawn as the mucosa was fully examined.         COLON FINDINGS: Inflamed but healing proximal rectal/distal sigmoid mucosa.  No obstruction. Other sigmoid examined and distal rectum ok. Retroflexion was not performed.    The scope was then withdrawn from the patient and the procedure terminated.  COMPLICATIONS: There were no complications.  ENDOSCOPIC IMPRESSION: Inflamed but healing proximal rectal/distal sigmoid mucosa.  No obstruction.  RECOMMENDATIONS: I have started mirtazipine to see if that will stimulate his appetite.  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 May not need metaclopramide anymore - will follow-up.       eSigned:  Iva Boop, MD, [facility] 07/24/2012 10:02 AM

## 2012-07-24 NOTE — Progress Notes (Signed)
CSW continues to follow for snf versus hh. Patient's outcomes not yet certain. Milos Milligan C. Evann Koelzer MSW, LCSW [phone]

## 2012-07-24 NOTE — Progress Notes (Signed)
Gave patient 350 cc tap H2O enema with loose watery results. Patient began shaking and retching with small amt clear return. Patient states doesn't think he can take more enema. Noreene Larsson in Endo notified.

## 2012-07-24 NOTE — Progress Notes (Signed)
Subjective: Patient is post sedation of sigmoidoscopy and resting. Per wife patient has been very nauseous and required phenergan today. Wife is concerned about decreased appetite with persistent loose stools.   Objective: Physical Exam: BP 118/88  Pulse 85  Temp(Src) 98.7 F (37.1 C) (Oral)  Resp 23  Ht 5\' 11"  (1.803 m)  Wt 220 lb 11.2 oz (100.109 kg)  BMI 30.8 kg/m2  SpO2 100%  TG drain intact, output about 5-10 cc's today.   Labs: CBC No results found for this basename: WBC, HGB, HCT, PLT,  in the last 72 hours BMET  Recent Labs  07/23/12 0431  NA 137  K 4.1  CL 107  CO2 22  GLUCOSE 107*  BUN 6  CREATININE 1.08  CALCIUM 9.8   LFT  Recent Labs  07/23/12 0431  PROT 6.6  ALBUMIN 3.1*  AST 18  ALT 17  ALKPHOS 85  BILITOT 0.5   PT/INR No results found for this basename: LABPROT, INR,  in the last 72 hours   Studies/Results: No results found.  Assessment/Plan: S/p pelvic hematoma drain 07/15/12, drain intact with 5ml of bloody fluid within today. CT on 7/21 demonstrate hematoma markedly reduced in size post drainage.   Active complaint is nausea and decreased appetite. Vancomycin discontinued post C. Diff treatment with negative follow-up testing. Patient is post sigmoidoscopy today showing inflamed, but healing proximal rectal and distal sigmoid mucosa, no obstruction. Mirtazapine has been started for appetite.     LOS: 32 days    Brayton El PA-C 07/24/2012 3:51 PM

## 2012-07-24 NOTE — Progress Notes (Signed)
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 Healed well initially and sent home 6/17. Loopogram 6/27 without internal leak.  2 - Fever / h/o Bacteruria - Pt with MRSA in urine by hospital UCX 03/2012 and again subsequently in blood. Now on Vancomycin daily per ID x 4-6 week total (nearing end of course). Most recent UCX, BCX negative, New set pending from 6/22. Pt readmitted with low-grade fever 6/22 to 100.7 and malaise, no sig leukocytosis. Now on Vanc + Zosyn emirically. UCX with yeast (trated diflucan x 3 days), BCX no growth to date.Pelvic fluid aspirate and no growth / final.  ID now following,and DC'd all ABX given eval except for current C. Diff treatment which finished and now CDiff negative 7/18.  3 - Heme / Recent DVT + PE -incidental PE by CT 03/2012 now on Lovenox, DVT resolved by most recent LE duplex. Repeat CT-PE 6/23 without PE or active bleed from pelvis and Lovenox DC'd. Hgb drift to 7 6/24 and transfused 2 u with response to 8, transfused 2u additional 6/25 with increase to 10. Hematology consult [name]) also agrees no treatment dose anticoagulation at this point, but resume proph dosing since he is not ambulating much.  4 - Pelvic Fluid Collection - Pelvic fluid collection without rim-enhancement noted on CT from ER 6/22. Appears partially simple fluid with some dependent blood by HU analysis, no active extrav of blood or urine by imaging. IR aspiration performed 6/26 and fluid c/w hematoma only (not purulent, not simple/drainable). Repeat aspiration with insertion of 56F pigtail performed 7/14 at which time  calculated volume using ellipsoid approximation . Hematoma drain has steadily put out dark hematoma fluid and repeat imaging 7/21 with calculated volume of hematoma .  5 - Hemorrhoids / Lower GI Bleed - Long h/o hemorroids. Pt with worsened / friable bleeding hemorroids on admission that are quite bothersome. No prior specific intervention, likely exacerbated by pelvic fluid collection / hematoma. Gen Surg recs conservative measures and now nearly resolved clinically.  6 - Nutrition / Nausea / C. Difficile- Pt with some element of baseline GI issues with GERD and prior small esophageal polyp. Has had exacerbation of nausea in house and not meeting nutritional goals. GI involved and initial EGD + KUB with ileus / gastroparesis picture. While ileus resolving placed on TPN and NGT for symptom relief and to bridge him with nutrition which was resumed 7/11 following successful decompression with rectal tube place with aide of sigmoidoscopy. C. Diff found positive 7/5 and started on dual therapy with flagyl + vanc enemas with plan for 14 day course which has ended. Rectal tube DC'd 7/17. Pt still with refractory nausea without emesis and new CT 7/21 without sig ileus patter or clear obstruction. Repeat sigmoidoscopy 7/23 with much improved rectal tissues and decreased extrinsic compression.  7 - Disposition - PT eval has recommended DC to SNF when appropriate and family amenable.  PMH sig for obesity and left knee replacement, PE + DVT (now resolved). No CV disease.   Today Chad Olson s/p flexible sigmoidoscopy. Mild improvement in nausea yesterday. Calorie count noted and clearly inadequate.  Objective: Vital signs in last 24 hours: Temp:  [97.5 F (36.4 C)-98.7 F (37.1 C)] 98.7 F (37.1 C) (07/23 1045) Pulse Rate:  [  60-96] 85 (07/23 1045) Resp:  [13-26] 23 (07/23 1030) BP: (118-135)/(80-88) 118/88 mmHg (07/23 1045) SpO2:  [90 %-100 %] 100 % (07/23 1045) Last BM Date:  07/24/12  Intake/Output from previous day: 07/22 0701 - 07/23 0700 In: 1170 [P.O.:360; I.V.:800] Out: 2025 [Urine:2000; Drains:23; Stool:2] Intake/Output this shift: Total I/O In: 455.8 [I.V.:455.8] Out: 452 [Urine:450; Stool:2]  General appearance: alert, cooperative, appears stated age and family at bedside Head: Normocephalic, without obvious abnormality, atraumatic Eyes: conjunctivae/corneas clear. PERRL, EOM's intact. Fundi benign. Ears: normal TM's and external ear canals both ears Nose: Nares normal. Septum midline. Mucosa normal. No drainage or sinus tenderness. Throat: lips, mucosa, and tongue normal; teeth and gums normal Neck: no adenopathy, no carotid bruit, no JVD, supple, symmetrical, trachea midline and thyroid not enlarged, symmetric, no tenderness/mass/nodules Back: symmetric, no curvature. ROM normal. No CVA tenderness. Resp: clear to auscultation bilaterally Chest wall: no tenderness Cardio: regular rate and rhythm, S1, S2 normal, no murmur, click, rub or gallop GI: soft, non-tender; bowel sounds normal; no masses,  no organomegaly Male genitalia: normal Extremities: extremities normal, atraumatic, no cyanosis or edema and RUE PICC w/o erythema. Pulses: 2+ and symmetric Skin: Skin color, texture, turgor normal. No rashes or lesions Lymph nodes: Cervical, supraclavicular, and axillary nodes normal. Neurologic: Grossly normal Incision/Wound: RLQ URostomy pink and patent. Left neph tube capped and c/d/i. Left buttocks hematoma drain with continued output of dark appearing old blood.  Lab Results:  No results found for this basename: WBC, HGB, HCT, PLT,  in the last 72 hours BMET  Recent Labs  07/23/12 0431  NA 137  K 4.1  CL 107  CO2 22  GLUCOSE 107*  BUN 6  CREATININE 1.08  CALCIUM 9.8   PT/INR No results found for this basename: LABPROT, INR,  in the last 72 hours ABG No results found for this basename: PHART, PCO2, PO2, HCO3,  in the last 72  hours  Studies/Results: No results found.  Anti-infectives: Anti-infectives   Start     Dose/Rate Route Frequency Ordered Stop   07/11/12 1200  vancomycin (VANCOCIN) 50 mg/mL oral solution 250 mg  Status:  Discontinued     250 mg Oral 4 times per day 07/11/12 1013 07/24/12 1159   07/06/12 1800  vancomycin (VANCOCIN) 500 mg in sodium chloride irrigation 0.9 % 100 mL ENEMA  Status:  Discontinued     500 mg Rectal 4 times per day 07/06/12 1335 07/11/12 1244   07/06/12 1430  metroNIDAZOLE (FLAGYL) IVPB 500 mg  Status:  Discontinued     500 mg 100 mL/hr over 60 Minutes Intravenous 3 times per day 07/06/12 1335 07/14/12 0725   06/25/12 1000  vancomycin (VANCOCIN) 1,250 mg in sodium chloride 0.9 % 250 mL IVPB  Status:  Discontinued     1,250 mg 166.7 mL/hr over 90 Minutes Intravenous Every 12 hours 06/25/12 0856 06/28/12 1102   06/24/12 2000  fluconazole (DIFLUCAN) IVPB 200 mg     200 mg 100 mL/hr over 60 Minutes Intravenous Every 24 hours 06/24/12 1733 06/26/12 2322   06/23/12 0900  vancomycin (VANCOCIN) 1,250 mg in sodium chloride 0.9 % 250 mL IVPB     1,250 mg 166.7 mL/hr over 90 Minutes Intravenous Every 12 hours 06/23/12 0522 06/24/12 2231   06/23/12 0600  piperacillin-tazobactam (ZOSYN) IVPB 3.375 g  Status:  Discontinued     3.375 g 12.5 mL/hr over 240 Minutes Intravenous 3 times per day 06/23/12 0447 06/28/12 1102   06/23/12 0330  cefTRIAXone (ROCEPHIN)  1 g in dextrose 5 % 50 mL IVPB     1 g 100 mL/hr over 30 Minutes Intravenous  Once 06/23/12 0319 06/23/12 0440      Assessment/Plan:  1 - Bladder Cancer / Cystectomy / Ureteral Revision- NED form cancer perspective.Loopogram without leak this admission. WIll consider  left neph tube removal under fluoro later this admission when back to baseline in terms of GI status.  2 - Fever / h/o Bacteruria - Now off wound-specific ABX given negative CX from blood, urine, pelvic fluid.  3 - Heme / Recent DVT + PE -Lovenox now stopped.  SCD's in place.  . PT following. Remain proph SQ heparin + SCD's.   4 - Pelvic Fluid Collection - Imaging and aspirate c/w non-infected hemaotma. Hematoma reimaging 7/21 with approx 60%+ volume reduction, would keep drain in place as long as putting out fluid to hasten resolution.  5 - Hemorrhoids / Lower GI Bleed - Agree with conservative measures for now. Hgb stable.  6 - Nutrition / Nausea / C. Difficile- Remains central active issue. CDiff treatment now completed and negative post-treatment. Imaging with decreased ileus / obstructive component but still with nutrition-limiting symptoms. CMP today w/o elevated LFT's. Exam with possible mild thrush now on course of Nystatin PO solution. Repeat GI eval withotu sig mechanical obstruciton and will further evaluate possible medical contributors. PO Vanc DC'd.  7 - Disposition - SNF pending nutritional status and ileus resolution.  Greatly appreciate gen surgery, PT, GI  input.  [facility], Chad Olson 07/24/2012

## 2012-07-25 ENCOUNTER — Encounter (HOSPITAL_COMMUNITY): Payer: Self-pay | Admitting: Internal Medicine

## 2012-07-25 ENCOUNTER — Inpatient Hospital Stay (HOSPITAL_COMMUNITY): Payer: BC Managed Care – PPO

## 2012-07-25 DIAGNOSIS — R197 Diarrhea, unspecified: Secondary | ICD-10-CM

## 2012-07-25 LAB — GLUCOSE, CAPILLARY: Glucose-Capillary: 136 mg/dL — ABNORMAL HIGH (ref 70–99)

## 2012-07-25 MED ORDER — PIPERACILLIN-TAZOBACTAM 3.375 G IVPB
3.3750 g | Freq: Three times a day (TID) | INTRAVENOUS | Status: DC
  Administered 2012-07-25 – 2012-07-29 (×12): 3.375 g via INTRAVENOUS
  Filled 2012-07-25 (×13): qty 50

## 2012-07-25 MED ORDER — IOHEXOL 300 MG/ML  SOLN
25.0000 mL | INTRAMUSCULAR | Status: AC
  Administered 2012-07-25: 25 mL via ORAL

## 2012-07-25 MED ORDER — INSULIN ASPART 100 UNIT/ML ~~LOC~~ SOLN: 0.0000 [IU] | Freq: Every day | SUBCUTANEOUS | Status: DC

## 2012-07-25 MED ORDER — SODIUM CHLORIDE 0.9 % IV BOLUS (SEPSIS)
500.0000 mL | Freq: Once | INTRAVENOUS | Status: AC
  Administered 2012-07-25: 500 mL via INTRAVENOUS

## 2012-07-25 MED ORDER — ACETAMINOPHEN 650 MG RE SUPP: 650.0000 mg | Freq: Four times a day (QID) | RECTAL | Status: DC | PRN

## 2012-07-25 MED ORDER — INSULIN ASPART 100 UNIT/ML ~~LOC~~ SOLN
0.0000 [IU] | Freq: Three times a day (TID) | SUBCUTANEOUS | Status: DC
  Administered 2012-07-25 – 2012-07-26 (×2): 1 [IU] via SUBCUTANEOUS

## 2012-07-25 MED ORDER — ACETAMINOPHEN 650 MG RE SUPP
RECTAL | Status: AC
  Administered 2012-07-25: 650 mg
  Filled 2012-07-25: qty 1

## 2012-07-25 MED ORDER — FAT EMULSION 20 % IV EMUL
240.0000 mL | INTRAVENOUS | Status: AC
  Administered 2012-07-25: 240 mL via INTRAVENOUS
  Filled 2012-07-25: qty 250

## 2012-07-25 MED ORDER — CLINIMIX E/DEXTROSE (5/15) 5 % IV SOLN
INTRAVENOUS | Status: AC
  Administered 2012-07-25: 18:00:00 via INTRAVENOUS
  Filled 2012-07-25: qty 1000

## 2012-07-25 NOTE — Progress Notes (Signed)
Stopped to see patient today. His family is present and several doctors and other medical people have been in to see him. He is not that talkative. I listened to his concerns and offered him encouragement and support. We had a short prayer. He seemed appreciative of the visit.

## 2012-07-25 NOTE — Progress Notes (Addendum)
1 Day Post-Op  Subjective:  Pt now febrile with persistent loose stools, intermittent N/V; denies CP, dyspnea or cough; facial erythema/flushing noted  Objective: Vital signs in last 24 hours: Temp:  [97.7 F (36.5 C)-99.8 F (37.7 C)] 99.8 F (37.7 C) 08/11/22 1543) Pulse Rate:  [83-114] 114 08/11/22 1543) Resp:  [16] 16 08/11/22 1543) BP: (108-123)/(77-86) 123/77 mmHg 11-Aug-2022 1543) SpO2:  [97 %] 97 % August 11, 2022 1543) Last BM Date: 07/24/12  Intake/Output from previous day: 07/23 0701 - 08-11-22 0700 In: 1104.2 [P.O.:100; I.V.:999.2] Out: 1943 [Urine:1926; Emesis/NG output:2; Drains:10; Stool:5] Intake/Output this shift: Total I/O In: 693.3 [I.V.:693.3] Out: -   Pelvic drain intact, output about 10-15 cc's bloody fluid  Lab Results:  No results found for this basename: WBC, HGB, HCT, PLT,  in the last 72 hours BMET  Recent Labs  07/23/12 0431  NA 137  K 4.1  CL 107  CO2 22  GLUCOSE 107*  BUN 6  CREATININE 1.08  CALCIUM 9.8   PT/INR No results found for this basename: LABPROT, INR,  in the last 72 hours ABG No results found for this basename: PHART, PCO2, PO2, HCO3,  in the last 72 hours  Studies/Results: Dg Abd 2 Views  08/10/2012   *RADIOLOGY REPORT*  Clinical Data: Nausea, vomiting, diarrhea  ABDOMEN - 2 VIEW  Comparison: 07/15/2038  Findings: Left nephrostomy tube. Pigtail drainage catheter in pelvis. Bilateral ureteral stents through ileal conduit. Mild gaseous dilatation of the colon question ileus. Interval removal of rectal tube. No definite bowel wall thickening or free intraperitoneal air.  IMPRESSION: Postoperative colonic ileus.   Original Report Authenticated By: Ulyses Southward, M.D.    Anti-infectives: Anti-infectives   Start     Dose/Rate Route Frequency Ordered Stop   07/11/12 1200  vancomycin (VANCOCIN) 50 mg/mL oral solution 250 mg  Status:  Discontinued     250 mg Oral 4 times per day 07/11/12 1013 07/24/12 1159   07/06/12 1800  vancomycin (VANCOCIN) 500 mg  in sodium chloride irrigation 0.9 % 100 mL ENEMA  Status:  Discontinued     500 mg Rectal 4 times per day 07/06/12 1335 07/11/12 1244   07/06/12 1430  metroNIDAZOLE (FLAGYL) IVPB 500 mg  Status:  Discontinued     500 mg 100 mL/hr over 60 Minutes Intravenous 3 times per day 07/06/12 1335 07/14/12 0725   06/25/12 1000  vancomycin (VANCOCIN) 1,250 mg in sodium chloride 0.9 % 250 mL IVPB  Status:  Discontinued     1,250 mg 166.7 mL/hr over 90 Minutes Intravenous Every 12 hours 06/25/12 0856 06/28/12 1102   06/24/12 2000  fluconazole (DIFLUCAN) IVPB 200 mg     200 mg 100 mL/hr over 60 Minutes Intravenous Every 24 hours 06/24/12 1733 06/26/12 2322   06/23/12 0900  vancomycin (VANCOCIN) 1,250 mg in sodium chloride 0.9 % 250 mL IVPB     1,250 mg 166.7 mL/hr over 90 Minutes Intravenous Every 12 hours 06/23/12 0522 06/24/12 2231   06/23/12 0600  piperacillin-tazobactam (ZOSYN) IVPB 3.375 g  Status:  Discontinued     3.375 g 12.5 mL/hr over 240 Minutes Intravenous 3 times per day 06/23/12 0447 06/28/12 1102   06/23/12 0330  cefTRIAXone (ROCEPHIN) 1 g in dextrose 5 % 50 mL IVPB     1 g 100 mL/hr over 30 Minutes Intravenous  Once 06/23/12 0319 06/23/12 0440      Assessment/Plan: s/p pelvic hematoma drainage 7/14; panculture;check labs;  colonic ileus noted on abd film today; cont pelvic drain  for now;  GI to see    LOS: 33 days    Suhana Wilner,D [facility] 07/25/2012

## 2012-07-25 NOTE — Progress Notes (Signed)
[facility] Progress Note  Subjective:  Having nausea and vomiting even with sips of water.  Diarrhea just running out of him.  Objective:  Vital signs in last 24 hours: Temp:  [97.7 F (36.5 C)-98.7 F (37.1 C)] 98.1 F (36.7 C) (07/24 0458) Pulse Rate:  [83-109] 83 (07/24 0458) Resp:  [13-26] 16 (07/24 0458) BP: (108-135)/(78-88) 117/86 mmHg (07/24 0458) SpO2:  [90 %-100 %] 97 % (07/24 0458) Last BM Date: 07/24/12 General:   Alert, Well-developed, in NAD Heart:  Regular rate and rhythm; no murmurs Pulm:  CTAB.  No W/R/R. Abdomen:  Soft, non-distended.  BS present.  Mild right-sided TTP around area of stoma. Extremities:  Without edema. Neurologic:  Alert and  oriented x4;  grossly normal neurologically. Psych:  Alert and cooperative. Normal mood and affect.  Intake/Output from previous day: 07/23 0701 - 07/24 0700 In: 1104.2 [P.O.:100; I.V.:999.2] Out: 1943 [Urine:1926; Emesis/NG output:2; Drains:10; Stool:5] Intake/Output this shift: Total I/O In: 693.3 [I.V.:693.3] Out: -   BMET  Recent Labs  07/23/12 0431  NA 137  K 4.1  CL 107  CO2 22  GLUCOSE 107*  BUN 6  CREATININE 1.08  CALCIUM 9.8   LFT  Recent Labs  07/23/12 0431  PROT 6.6  ALBUMIN 3.1*  AST 18  ALT 17  ALKPHOS 85  BILITOT 0.5   Assessment / Plan: -67 y.o. male with rectal obstruction/ischemia from 13cm pelvic hematoma, deconditioning, Cdiff infection (recent repeat Cdiff negative), bladder cancer s/p several urinary surgeries/complications.   S/p transgluteal drain placed into the liquifying hematoma by IR on 7/14 with about 70% reduction in size.  Flex sig yesterday shows healing in the rectum with decreased extrinsic compression.  -Malnutrition/inadequate PO intake due to continued nausea and vomiting:  Placed on Remeron yesterday to see if this will help stimulate appetite.  Will recheck 2 view abdominal x-ray.  TNA going to be restarted today.  Will give NS bolus and increase  maintenance fluids as well.      LOS: 33 days   ZEHR, JESSICA D.  07/25/2012, 9:11 AM  Pager number [phone]   [facility] Attending  I have also seen and assessed the patient and agree with the above note. Had a bad night - he minimizes sxs - wife, family very concerned about the loose stools and vomiting. Imaging showed decreased hematoma so that does not seem to be the cause. Will see what xray tells [place] - increase support w/ TPN. Depending upon course might need another CT. May need to restart metaclopramide also, consider an EGD though doubt obstruction, suspect more functional upper GI problem.  Iva Boop, MD, [facility] [phone] (pager) 07/25/2012 11:01 AM

## 2012-07-25 NOTE — Progress Notes (Signed)
PT Cancellation Note  _X_Treatment cancelled today due to medical issues with patient which prohibited therapy..........ongoing N/V/D ......... too fatigued to tolerate  ___ Treatment cancelled today due to patient receiving procedure or test   ___ Treatment cancelled today due to patient's refusal to participate   ___ Treatment cancelled today due to  [name]  PTA [facility]  Acute  Rehab Pager      [phone]

## 2012-07-25 NOTE — Progress Notes (Signed)
[facility] Progress Note  Patient Name: Chad Olson Date of Encounter: 07/25/2012, 4:59 PM    Subjective  Following up - now w/ fever Not taking po and still w/ nausea and vomiting   Objective    Physical Exam: Filed Vitals:   07/25/12 1543  BP: 123/77  Pulse: 114  Temp: 99.8 F (37.7 C)  Resp: 16   General: unwell, flushed Abdomen: soft, mildly diffusely tender     Intake/Output Summary (Last 24 hours) at 07/25/12 1659 Last data filed at 07/25/12 1300  Gross per 24 hour  Intake 1341.66 ml  Output   1486 ml  Net -144.34 ml       Assessment and Plan  New fever w/ persistent nausea/vomiting  and diarrhea   He is being cxed. Will add CT abd/pelvis NG tube Hold on re-insertion of red rectal tube - before I knew these events was planning to try that

## 2012-07-25 NOTE — Progress Notes (Signed)
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 Healed well initially and sent home 6/17. Loopogram 6/27 without internal leak.  2 - Fever / h/o Bacteruria - Pt with MRSA in urine by hospital UCX 03/2012 and again subsequently in blood. Now on Vancomycin daily per ID x 4-6 week total (nearing end of course). Most recent UCX, BCX negative, New set pending from 6/22. Pt readmitted with low-grade fever 6/22 to 100.7 and malaise, no sig leukocytosis. Now on Vanc + Zosyn emirically. UCX with yeast (trated diflucan x 3 days), BCX no growth to date.Pelvic fluid aspirate and no growth / final.  ID now following,and DC'd all ABX given eval except for current C. Diff treatment which finished and now CDiff negative 7/18.  3 - Heme / Recent DVT + PE -incidental PE by CT 03/2012 now on Lovenox, DVT resolved by most recent LE duplex. Repeat CT-PE 6/23 without PE or active bleed from pelvis and Lovenox DC'd. Hgb drift to 7 6/24 and transfused 2 u with response to 8, transfused 2u additional 6/25 with increase to 10. Hematology consult [name]) also agrees no treatment dose anticoagulation at this point, but resume proph dosing since he is not ambulating much.  4 - Pelvic Fluid Collection - Pelvic fluid collection without rim-enhancement noted on CT from ER 6/22. Appears partially simple fluid with some dependent blood by HU analysis, no active extrav of blood or urine by imaging. IR aspiration performed 6/26 and fluid c/w hematoma only (not purulent, not simple/drainable). Repeat aspiration with insertion of 38F pigtail performed 7/14 at which time  calculated volume using ellipsoid approximation . Hematoma drain has steadily put out dark hematoma fluid and repeat imaging 7/21 with calculated volume of hematoma .  5 - Hemorrhoids / Lower GI Bleed - Long h/o hemorroids. Pt with worsened / friable bleeding hemorroids on admission that are quite bothersome. No prior specific intervention, likely exacerbated by pelvic fluid collection / hematoma. Gen Surg recs conservative measures and now nearly resolved clinically.  6 - Nutrition / Nausea / C. Difficile- Pt with some element of baseline GI issues with GERD and prior small esophageal polyp. Has had exacerbation of nausea in house and not meeting nutritional goals. GI involved and initial EGD + KUB with ileus / gastroparesis picture. While ileus resolving placed on TPN and NGT for symptom relief and to bridge him with nutrition which was resumed 7/11 following successful decompression with rectal tube place with aide of sigmoidoscopy. C. Diff found positive 7/5 and started on dual therapy with flagyl + vanc enemas with plan for 14 day course which has ended. Rectal tube DC'd 7/17. Pt still with refractory nausea without emesis and new CT 7/21 without sig ileus patter or clear obstruction. Repeat sigmoidoscopy 7/23 with much improved rectal tissues and decreased extrinsic compression.  7 - Disposition - PT eval has recommended DC to SNF when appropriate and family amenable.  PMH sig for obesity and left knee replacement, PE + DVT (now resolved). No CV disease.   Today Romney complains of worsened nausea, now with emesis x several in addition to loose stools.  Objective: Vital signs in last 24 hours: Temp:  [97.7 F (36.5 C)-98.1 F (36.7 C)] 98.1 F (36.7 C) (07/24 0458) Pulse Rate:  [  83-109] 83 2022-07-31 0458) Resp:  [16] 16 07/31/2022 0458) BP: (108-121)/(78-86) 117/86 mmHg 07-31-22 0458) SpO2:  [97 %] 97 % 31-Jul-2022 0458) Last BM Date: 07/24/12  Intake/Output from previous day: 07/23 0701  - 07/31/22 0700 In: 1104.2 [P.O.:100; I.V.:999.2] Out: 1943 [Urine:1926; Emesis/NG output:2; Drains:10; Stool:5] Intake/Output this shift: Total I/O In: 693.3 [I.V.:693.3] Out: -   General appearance: alert, cooperative, appears stated age and more tired today, family at bedside Head: Normocephalic, without obvious abnormality, atraumatic Eyes: conjunctivae/corneas clear. PERRL, EOM's intact. Fundi benign. Ears: normal TM's and external ear canals both ears Nose: Nares normal. Septum midline. Mucosa normal. No drainage or sinus tenderness. Throat: lips, mucosa, and tongue normal; teeth and gums normal Neck: no adenopathy, no carotid bruit, no JVD, supple, symmetrical, trachea midline and thyroid not enlarged, symmetric, no tenderness/mass/nodules Back: symmetric, no curvature. ROM normal. No CVA tenderness. Resp: clear to auscultation bilaterally Chest wall: no tenderness Cardio: regular rate and rhythm, S1, S2 normal, no murmur, click, rub or gallop GI: soft, non-tender; bowel sounds normal; no masses,  no organomegaly and No abd distention or tympany whatsoever. Male genitalia: normal Pelvic: perineal ecchymoses completely resolved Extremities: extremities normal, atraumatic, no cyanosis or edema and RUE PICC c/d/i. Pulses: 2+ and symmetric Skin: Skin color, texture, turgor normal. No rashes or lesions Lymph nodes: Cervical, supraclavicular, and axillary nodes normal. Neurologic: Grossly normal Incision/Wound: RLQ Urostomy pink / patent with Bander stents in place. Hematoma drain with persist ant output of dark blood fluid.  Lab Results:  No results found for this basename: WBC, HGB, HCT, PLT,  in the last 72 hours BMET  Recent Labs  07/23/12 0431  NA 137  K 4.1  CL 107  CO2 22  GLUCOSE 107*  BUN 6  CREATININE 1.08  CALCIUM 9.8   PT/INR No results found for this basename: LABPROT, INR,  in the last 72 hours ABG No results found for this basename: PHART, PCO2, PO2, HCO3,   in the last 72 hours  Studies/Results: Dg Abd 2 Views  07/30/2012   *RADIOLOGY REPORT*  Clinical Data: Nausea, vomiting, diarrhea  ABDOMEN - 2 VIEW  Comparison: 07/15/2038  Findings: Left nephrostomy tube. Pigtail drainage catheter in pelvis. Bilateral ureteral stents through ileal conduit. Mild gaseous dilatation of the colon question ileus. Interval removal of rectal tube. No definite bowel wall thickening or free intraperitoneal air.  IMPRESSION: Postoperative colonic ileus.   Original Report Authenticated By: Ulyses Southward, M.D.    Anti-infectives: Anti-infectives   Start     Dose/Rate Route Frequency Ordered Stop   07/11/12 1200  vancomycin (VANCOCIN) 50 mg/mL oral solution 250 mg  Status:  Discontinued     250 mg Oral 4 times per day 07/11/12 1013 07/24/12 1159   07/06/12 1800  vancomycin (VANCOCIN) 500 mg in sodium chloride irrigation 0.9 % 100 mL ENEMA  Status:  Discontinued     500 mg Rectal 4 times per day 07/06/12 1335 07/11/12 1244   07/06/12 1430  metroNIDAZOLE (FLAGYL) IVPB 500 mg  Status:  Discontinued     500 mg 100 mL/hr over 60 Minutes Intravenous 3 times per day 07/06/12 1335 07/14/12 0725   06/25/12 1000  vancomycin (VANCOCIN) 1,250 mg in sodium chloride 0.9 % 250 mL IVPB  Status:  Discontinued     1,250 mg 166.7 mL/hr over 90 Minutes Intravenous Every 12 hours 06/25/12 0856 06/28/12 1102   06/24/12 2000  fluconazole (DIFLUCAN) IVPB 200 mg     200 mg 100 mL/hr over 60  Minutes Intravenous Every 24 hours 06/24/12 1733 06/26/12 2322   06/23/12 0900  vancomycin (VANCOCIN) 1,250 mg in sodium chloride 0.9 % 250 mL IVPB     1,250 mg 166.7 mL/hr over 90 Minutes Intravenous Every 12 hours 06/23/12 0522 06/24/12 2231   06/23/12 0600  piperacillin-tazobactam (ZOSYN) IVPB 3.375 g  Status:  Discontinued     3.375 g 12.5 mL/hr over 240 Minutes Intravenous 3 times per day 06/23/12 0447 06/28/12 1102   06/23/12 0330  cefTRIAXone (ROCEPHIN) 1 g in dextrose 5 % 50 mL IVPB     1 g 100  mL/hr over 30 Minutes Intravenous  Once 06/23/12 0319 06/23/12 0440      Assessment/Plan:  1 - Bladder Cancer / Cystectomy / Ureteral Revision- NED form cancer perspective.Loopogram without leak this admission. WIll consider  left neph tube removal under fluoro later this admission when back to baseline in terms of GI status.  2 - Fever / h/o Bacteruria - Now off wound-specific ABX given negative CX from blood, urine, pelvic fluid.  3 - Heme / Recent DVT + PE -Lovenox now stopped. SCD's in place. PT following. Remain proph SQ heparin + SCD's.   4 - Pelvic Fluid Collection - Imaging and aspirate c/w non-infected hemaotma. Hematoma reimaging 7/21 with approx 60%+ volume reduction, would keep drain in place as long as putting out fluid to hasten resolution.  5 - Hemorrhoids / Lower GI Bleed - Agree with conservative measures for now. Hgb stable.  6 - Nutrition / Nausea / C. Difficile- Remains central active issue. CDiff treatment now completed and negative post-treatment. Imaging with decreased ileus / obstructive component but still with nutrition-limiting symptoms. Will restart TPN today with goal of 50% requironment and continue some PO intake. Greatly appreciate GI help in evaluating for both obstructive and medical etiologies.  7 - Disposition - SNF pending nutritional status and ileus resolution.  Greatly appreciate gen surgery, PT, GI  input.  [facility], Ashlei Chinchilla 07/25/2012

## 2012-07-25 NOTE — Progress Notes (Signed)
PARENTERAL NUTRITION CONSULT NOTE - Re-Initiation  Pharmacy Consult for TNA Indication: Gastroparesis/ileus  No Known Allergies  Patient Measurements: Height: 5\' 11"  (180.3 cm) Weight: 220 lb 11.2 oz (100.109 kg) IBW/kg (Calculated) : 75.3   Vital Signs: Temp: 98.1 F (36.7 C) (07/24 0458) Temp src: Oral (07/24 0458) BP: 117/86 mmHg (07/24 0458) Pulse Rate: 83 (07/24 0458) Intake/Output from previous day: 07/23 0701 - 07/24 0700 In: 1104.2 [P.O.:100; I.V.:999.2] Out: 1943 [Urine:1926; Emesis/NG output:2; Drains:10; Stool:5] Intake/Output from this shift: Total I/O In: 693.3 [I.V.:693.3] Out: -   Labs: No results found for this basename: WBC, HGB, HCT, PLT, APTT, INR,  in the last 72 hours   Recent Labs  07/23/12 0431  NA 137  K 4.1  CL 107  CO2 22  GLUCOSE 107*  BUN 6  CREATININE 1.08  CALCIUM 9.8  PROT 6.6  ALBUMIN 3.1*  AST 18  ALT 17  ALKPHOS 85  BILITOT 0.5   Estimated Creatinine Clearance: 81.1 ml/min (by C-G formula based on Cr of 1.08).   No results found for this basename: GLUCAP,  in the last 72 hours  Assessment:  67 YO M admitted 6/22 with abdominal pain s/p radical cystoprostatectomy for bladder cancer in Feb,2014. On 06/15/12 he required ureteral reimplantation into lleal conduit. He had been unable to tolerate oral diet with persistent N/V, EGD done 7/1 revealed likely gastroparesis, abd films 7/2 consistent with ileus. NGT placed 7/2 with immediate brown-green drainage. Due to poor nutritional intake and ileus, TPN was started 7/2 PM and continued until 7/13 when pt appeared to be doing better with PO nutrition following successful decompression with rectal tube placement (since d/c'd 7/17).  Pt still with refractory nausea without emesis and new CT 7/21 without sig ileus patter or clear obstruction. Repeat sigmoidoscopy 7/23 shows improvement per urology.  Patient has not been making good progress with PO nutrition and MD would like to resume TPN  to meet 50% of nutritional goals while progressing.  Resuming TNA today via PICC (placed 07/03/12).  Nutritional Goals:  - RD recs 7/21: 1950-2100 Kcal, 95-120g protein, 1.9-2.1 L fluid/day - Clinimix E 5/15 at a rate of 44ml/hr + 20% fat emulsion at 66ml/hr to provide: 48g/day protein, 1162Kcal/day = ~50% of nutritional goals  Current nutrition:  - Diet: regular diet + Ensure TID - mIVF:  D51/2NS + 20 mEq/L KCl @ 50 ml/hr  CBGs & Insulin requirements past 24 hours:  None ordered.  Labs: Electrolytes: WNL 2/22.  Mag/Phos previously stable on TNA.  Will f/u levels in AM. Renal Function: Scr wnl/stable. Good UOP Hepatic Function: wnl Pre-Albumin: 16.6 (7/7) Triglycerides: wnl 161 (7/3), 110 (7/7) CBGs: previously controlled, will reorder CBGs to monitor   Plan:    At 1800, start Clinimix E 5/15 at goal rate of 40 ml/hr.  TNA to contain IV fat emulsion 20% at 10 ml/hr daily.  Continue multivitamin with minerals tablet PO daily.  If cannot take PO, will add to TNA.  CBGs and sensitive scale SSI ACHS.  Continue MIVF at 50 ml/hr.  TNA labs Monday/Thursdays.  F/u TNA labs tomorrow AM.  Pharmacy will follow up daily.  F/u progression of PO diet.   Clance Boll, PharmD, BCPS Pager: [phone] 07/25/2012 8:25 AM
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
## 2012-07-25 NOTE — Progress Notes (Signed)
RN attempted to insert NGT. Pt sitting upright, sipping on water. Tube inserted into left nare, pt began gagging, stating "I can't do this. I cannot do this. I'll try to drink the contrast." NGT removed from pt's nose. Will continue to monitor pt.

## 2012-07-26 ENCOUNTER — Encounter (HOSPITAL_COMMUNITY): Payer: Self-pay | Admitting: Radiology

## 2012-07-26 LAB — GLUCOSE, CAPILLARY
Glucose-Capillary: 138 mg/dL — ABNORMAL HIGH (ref 70–99)
Glucose-Capillary: 144 mg/dL — ABNORMAL HIGH (ref 70–99)
Glucose-Capillary: 149 mg/dL — ABNORMAL HIGH (ref 70–99)
Glucose-Capillary: 149 mg/dL — ABNORMAL HIGH (ref 70–99)

## 2012-07-26 LAB — COMPREHENSIVE METABOLIC PANEL
ALT: 12 U/L (ref 0–53)
AST: 8 U/L (ref 0–37)
Albumin: 2.5 g/dL — ABNORMAL LOW (ref 3.5–5.2)
Alkaline Phosphatase: 76 U/L (ref 39–117)
BUN: 13 mg/dL (ref 6–23)
Chloride: 101 mEq/L (ref 96–112)
Potassium: 2.9 mEq/L — ABNORMAL LOW (ref 3.5–5.1)
Sodium: 133 mEq/L — ABNORMAL LOW (ref 135–145)
Total Bilirubin: 0.6 mg/dL (ref 0.3–1.2)
Total Protein: 6.2 g/dL (ref 6.0–8.3)

## 2012-07-26 LAB — CBC
HCT: 40.7 % (ref 39.0–52.0)
Hemoglobin: 13.4 g/dL (ref 13.0–17.0)
MCV: 93.6 fL (ref 78.0–100.0)
RBC: 4.35 MIL/uL (ref 4.22–5.81)
RDW: 15.7 % — ABNORMAL HIGH (ref 11.5–15.5)
WBC: 10.5 10*3/uL (ref 4.0–10.5)

## 2012-07-26 LAB — BASIC METABOLIC PANEL
CO2: 21 mEq/L (ref 19–32)
Chloride: 103 mEq/L (ref 96–112)
Creatinine, Ser: 1.23 mg/dL (ref 0.50–1.35)
GFR calc Af Amer: 69 mL/min — ABNORMAL LOW (ref >90)
Potassium: 3.2 mEq/L — ABNORMAL LOW (ref 3.5–5.1)

## 2012-07-26 LAB — DIFFERENTIAL
Basophils Absolute: 0 10*3/uL (ref 0.0–0.1)
Eosinophils Relative: 0 % (ref 0–5)
Lymphocytes Relative: 5 % — ABNORMAL LOW (ref 12–46)
Lymphs Abs: 0.5 10*3/uL — ABNORMAL LOW (ref 0.7–4.0)
Monocytes Absolute: 0.8 10*3/uL (ref 0.1–1.0)
Neutro Abs: 9.2 10*3/uL — ABNORMAL HIGH (ref 1.7–7.7)

## 2012-07-26 LAB — CLOSTRIDIUM DIFFICILE BY PCR: Toxigenic C. Difficile by PCR: NEGATIVE

## 2012-07-26 LAB — PREALBUMIN: Prealbumin: 11.1 mg/dL — ABNORMAL LOW (ref 17.0–34.0)

## 2012-07-26 LAB — TRIGLYCERIDES: Triglycerides: 109 mg/dL (ref <150)

## 2012-07-26 MED ORDER — IOHEXOL 300 MG/ML  SOLN
100.0000 mL | Freq: Once | INTRAMUSCULAR | Status: AC | PRN
  Administered 2012-07-26: 100 mL via INTRAVENOUS

## 2012-07-26 MED ORDER — INSULIN ASPART 100 UNIT/ML ~~LOC~~ SOLN
0.0000 [IU] | Freq: Four times a day (QID) | SUBCUTANEOUS | Status: DC
  Administered 2012-07-26 – 2012-07-27 (×3): 1 [IU] via SUBCUTANEOUS
  Administered 2012-07-27: 2 [IU] via SUBCUTANEOUS
  Administered 2012-07-27: 1 [IU] via SUBCUTANEOUS
  Administered 2012-07-28: 2 [IU] via SUBCUTANEOUS
  Administered 2012-07-28: 13:00:00 via SUBCUTANEOUS
  Administered 2012-07-28 – 2012-07-29 (×2): 1 [IU] via SUBCUTANEOUS
  Administered 2012-07-29: 2 [IU] via SUBCUTANEOUS

## 2012-07-26 MED ORDER — FAT EMULSION 20 % IV EMUL
240.0000 mL | INTRAVENOUS | Status: AC
  Administered 2012-07-26: 240 mL via INTRAVENOUS
  Filled 2012-07-26: qty 250

## 2012-07-26 MED ORDER — POTASSIUM CHLORIDE 10 MEQ/50ML IV SOLN
10.0000 meq | INTRAVENOUS | Status: AC
  Administered 2012-07-26 (×4): 10 meq via INTRAVENOUS
  Filled 2012-07-26 (×4): qty 50

## 2012-07-26 MED ORDER — TRACE MINERALS CR-CU-F-FE-I-MN-MO-SE-ZN IV SOLN
INTRAVENOUS | Status: AC
  Administered 2012-07-26: 18:00:00 via INTRAVENOUS
  Filled 2012-07-26: qty 1000

## 2012-07-26 NOTE — Progress Notes (Signed)
[facility] Progress Note  Subjective:  Still with nausea and vomiting.  Refused NGT.  Family requesting transfer to [facility]  Gram negative rods growing in pelvic hematoma so back on Zosyn.  Objective:  Vital signs in last 24 hours: Temp:  [98.6 F (37 C)-102.1 F (38.9 C)] 98.7 F (37.1 C) (07/25 0803) Pulse Rate:  [92-114] 92 (07/25 0636) Resp:  [16-20] 20 (07/25 0636) BP: (123-130)/(74-84) 130/84 mmHg (07/25 0636) SpO2:  [95 %-99 %] 99 % (07/25 0636) Last BM Date: 07/25/12 (frequent loose stools) General:   Alert, chronically ill-appearing, in NAD Heart:  Regular rate and rhythm; no murmurs Pulm:  CTAB.  No W/R/R. Abdomen:  Soft, non-distended.  BS present.  Mild diffuse TTP but abdomen benign. Extremities:  Without edema. Neurologic:  Alert and  oriented x4;  grossly normal neurologically. Psych:  Alert and cooperative. Depressed mood.  Intake/Output from previous day: 07/24 0701 - 07/25 0700 In: 2986.7 [I.V.:2886.7; IV Piggyback:100] Out: 1600 [Urine:1600]  Lab Results:  Recent Labs  07/26/12 0427  WBC 10.5  HGB 13.4  HCT 40.7  PLT 199   BMET  Recent Labs  07/26/12 0427  NA 133*  K 2.9*  CL 101  CO2 20  GLUCOSE 147*  BUN 13  CREATININE 1.24  CALCIUM 9.4   LFT  Recent Labs  07/26/12 0427  PROT 6.2  ALBUMIN 2.5*  AST 8  ALT 12  ALKPHOS 76  BILITOT 0.6   Ct Abdomen Pelvis W Contrast  07/26/2012   *RADIOLOGY REPORT*  Clinical Data: Fever.  Nausea and vomiting.  Diarrhea.  Pelvic hematoma.  CT ABDOMEN AND PELVIS WITH CONTRAST  Technique:  Multidetector CT imaging of the abdomen and pelvis was performed following the standard protocol during bolus administration of intravenous contrast.  Contrast: OMNIPAQUE IOHEXOL 300 MG/ML  SOLN  Comparison: 07/22/2012.  Findings: Dependent atelectasis in the lung bases.  The liver, spleen, gallbladder, pancreas, adrenal glands, abdominal aorta, inferior vena cava, and retroperitoneal lymph nodes are  unremarkable and unchanged.  There is a right lower quadrant urinary conduit with bilateral ureteral stents.  Right percutaneous nephrostomy tube.  Renal collecting systems and ureters are decompressed.  Cysts in the kidneys bilaterally, largest on the right measuring 4.4 cm diameter.  The stomach and small bowel are decompressed.  Contrast material flows to the cecum without evidence of obstruction.  The colon is mildly dilated and fluid- filled with air-fluid levels consistent with liquid stool.  This can be seen with diarrhea.  No colonic wall thickening is appreciated.  No free air or free fluid in the abdomen.  Pelvis:  Persistent presacral/low pelvic hematoma, measuring about 12 x 6.3 x 10.6 cm.  Pigtail drainage catheter via trans-sciatic approach remains in place and is located in the dependent portion of the hematoma.  No significant change in size.  No free pelvic fluid collections.  Prostate gland appears to be surgically absent. Bladder is surgically absent.  Bowel loops filled the space previously occupied by the bladder.  IMPRESSION: Presacral pelvic hematoma appears unchanged in size.  Pigtail drainage catheter in place.  Surgical resection of the bladder with right lower quadrant ureteral conduit.  Bilateral ureteral stents and right percutaneous nephrostomy tube.  Liquid stool in the colon without wall thickening suggesting diarrhea.  No significant change since previous study.   Original Report Authenticated By: Burman Nieves, M.D.   Dg Abd 2 Views  07/25/2012   *RADIOLOGY REPORT*  Clinical Data: Nausea, vomiting, diarrhea  ABDOMEN -  2 VIEW  Comparison: 07/15/2038  Findings: Left nephrostomy tube. Pigtail drainage catheter in pelvis. Bilateral ureteral stents through ileal conduit. Mild gaseous dilatation of the colon question ileus. Interval removal of rectal tube. No definite bowel wall thickening or free intraperitoneal air.  IMPRESSION: Postoperative colonic ileus.   Original Report  Authenticated By: Ulyses Southward, M.D.    Assessment / Plan: -67 y.o. male with rectal obstruction/ischemia from 13cm pelvic hematoma, deconditioning, Cdiff infection (repeat Cdiff pending), bladder cancer s/p several urinary surgeries/complications. S/p transgluteal drain placed into the liquifying hematoma by IR on 7/14 with about 70% reduction in size. Flex sig on 7/23 showed healing in the rectum with decreased extrinsic compression.  Pelvic fluid from hematoma grew GNR so he has been started on Zosyn. -Malnutrition/inadequate PO intake due to continued nausea and vomiting: Placed on Remeron to see if this will help stimulate appetite.  TNA restarted.  Recommended NGT, but patient refused/did not tolerate. -Hypokalemia:  Secondary to the above.  Getting for runs of K+.  Will recheck potassium later today.  Cannot tolerate PO K+.  *Repeat CT scan showed no significant changes.     LOS: 34 days   ZEHR, JESSICA D.  07/26/2012, 9:10 AM  Pager number [phone]   [facility] Attending  I have also seen and assessed the patient and agree with the above note. My best guess at this time is there is infection in the hematoma leading to current decline. Agree w/ Abx and other current Tx. We will follow-up Monday if here but Dr. Loreta Ave on call this weekend if needed.  Iva Boop, MD, [facility] [phone] (pager) 07/26/2012 2:51 PM

## 2012-07-26 NOTE — Progress Notes (Signed)
NUTRITION FOLLOW UP  Intervention:   Recommend increasing TPN rate meet 100% of estimated kcal and protein needs: Clinamix E 5/15 @ 90 ml/hr with lipids at 10 ml/hr will provide 2014 kcal and 108 grams of protein.   Nutrition Dx:   Inadequate oral intake related to poor appetite/nausea/vomiting/diarrhea as evidenced by pt not eating.  Goal:   Pt to meet >/= 90% of their estimated nutrition needs; not met  Monitor:   TPN rate; at 40 ml/hr meeting ~50% of needs PO intake; none Bowel function; diarrhea Weight; no new wt Labs; low sodium, low potassium, low albumin, low prealbumin  Assessment:   Pt asleep at time of visit. Wife reports that pt had a rough night and has not been able to keep anything down, even water. Pt has not been eating the past few days and the last time he was able to walk with PT was Sunday per wife's report. Current TPN : Clinimix E 5/15 at a rate of 48ml/hr + 20% fat emulsion at 84ml/hr to provide: 48g/day protein, 1162Kcal/day = ~50% of nutritional goals. Pt needs increased rate due to poor PO intake.   Height: Ht Readings from Last 1 Encounters:  06/23/12 5\' 11"  (1.803 m)    Weight Status:   Wt Readings from Last 1 Encounters:  06/23/12 220 lb 11.2 oz (100.109 kg)    Re-estimated needs:  Kcal: 2080-2270 Protein: 100-120 grams Fluid: 2.7 L  Skin: incisions on abdomen and buttocks  Diet Order: General   Intake/Output Summary (Last 24 hours) at 07/26/12 1221 Last data filed at 07/26/12 0700  Gross per 24 hour  Intake 2293.33 ml  Output   1600 ml  Net 693.33 ml    Last BM: 7/24   Labs:   Recent Labs Lab 07/21/12 1028 07/23/12 0431 07/26/12 0427  NA 135 137 133*  K 3.7 4.1 2.9*  CL 103 107 101  CO2 20 22 20   BUN 6 6 13   CREATININE 1.03 1.08 1.24  CALCIUM 9.7 9.8 9.4  MG  --   --  1.7  PHOS  --   --  3.1  GLUCOSE 127* 107* 147*    CBG (last 3)   Recent Labs  07/25/12 1740 07/25/12 2146 07/26/12 0803  GLUCAP 107* 159*  144*    Scheduled Meds: . famotidine  20 mg Oral Daily  . feeding supplement  237 mL Oral TID WC  . heparin subcutaneous  5,000 Units Subcutaneous Q8H  . hydrocortisone   Rectal TID  . insulin aspart  0-9 Units Subcutaneous Q6H  . metoprolol  5 mg Intravenous Q6H  . mirtazapine  7.5 mg Oral QHS  . piperacillin-tazobactam (ZOSYN)  IV  3.375 g Intravenous Q8H  . potassium chloride  10 mEq Intravenous Q1 Hr x 4  . potassium chloride  40 mEq Oral TID    Continuous Infusions: . dextrose 5 % and 0.45 % NaCl with KCl 20 mEq/L 100 mL/hr at 07/25/12 2215  . Marland KitchenTPN (CLINIMIX-E) Adult 40 mL/hr at 07/25/12 1827   And  . fat emulsion 240 mL (07/25/12 1829)  . Marland KitchenTPN (CLINIMIX-E) Adult     And  . fat emulsion      Ian Malkin RD, LDN Inpatient Clinical Dietitian Pager: [phone] After Hours Pager: [phone]

## 2012-07-26 NOTE — Progress Notes (Signed)
Dr. Berneice Heinrich notified of [facility] lab finding of gram negative rods in pelvic drainage.

## 2012-07-26 NOTE — Progress Notes (Signed)
PT family would like patient's Ketone level checked. Will relay to day RN.

## 2012-07-26 NOTE — Progress Notes (Signed)
Subjective: Patient still with c/o N/V, decreased appetite, generalized weakness and loose stools. No new fever or chills. Cultures back and show gram negative rods in pelvic hematoma, patient now on Zosyn. Family is very frustrated and would like second opinion at [facility] next week.   Objective: Physical Exam: BP 130/84  Pulse 92  Temp(Src) 99.6 F (37.6 C) (Oral)  Resp 20  Ht 5\' 11"  (1.803 m)  Wt 220 lb 11.2 oz (100.109 kg)  BMI 30.8 kg/m2  SpO2 99%  TG drain intact, dark bloody output 5cc   Labs: CBC  Recent Labs  07/26/12 0427  WBC 10.5  HGB 13.4  HCT 40.7  PLT 199   BMET  Recent Labs  07/26/12 0427  NA 133*  K 2.9*  CL 101  CO2 20  GLUCOSE 147*  BUN 13  CREATININE 1.24  CALCIUM 9.4   LFT  Recent Labs  07/26/12 0427  PROT 6.2  ALBUMIN 2.5*  AST 8  ALT 12  ALKPHOS 76  BILITOT 0.6   PT/INR No results found for this basename: LABPROT, INR,  in the last 72 hours   Studies/Results: Ct Abdomen Pelvis W Contrast  07/26/2012   *RADIOLOGY REPORT*  Clinical Data: Fever.  Nausea and vomiting.  Diarrhea.  Pelvic hematoma.  CT ABDOMEN AND PELVIS WITH CONTRAST  Technique:  Multidetector CT imaging of the abdomen and pelvis was performed following the standard protocol during bolus administration of intravenous contrast.  Contrast: OMNIPAQUE IOHEXOL 300 MG/ML  SOLN  Comparison: 07/22/2012.  Findings: Dependent atelectasis in the lung bases.  The liver, spleen, gallbladder, pancreas, adrenal glands, abdominal aorta, inferior vena cava, and retroperitoneal lymph nodes are unremarkable and unchanged.  There is a right lower quadrant urinary conduit with bilateral ureteral stents.  Right percutaneous nephrostomy tube.  Renal collecting systems and ureters are decompressed.  Cysts in the kidneys bilaterally, largest on the right measuring 4.4 cm diameter.  The stomach and small bowel are decompressed.  Contrast material flows to the cecum without evidence of  obstruction.  The colon is mildly dilated and fluid- filled with air-fluid levels consistent with liquid stool.  This can be seen with diarrhea.  No colonic wall thickening is appreciated.  No free air or free fluid in the abdomen.  Pelvis:  Persistent presacral/low pelvic hematoma, measuring about 12 x 6.3 x 10.6 cm.  Pigtail drainage catheter via trans-sciatic approach remains in place and is located in the dependent portion of the hematoma.  No significant change in size.  No free pelvic fluid collections.  Prostate gland appears to be surgically absent. Bladder is surgically absent.  Bowel loops filled the space previously occupied by the bladder.  IMPRESSION: Presacral pelvic hematoma appears unchanged in size.  Pigtail drainage catheter in place.  Surgical resection of the bladder with right lower quadrant ureteral conduit.  Bilateral ureteral stents and right percutaneous nephrostomy tube.  Liquid stool in the colon without wall thickening suggesting diarrhea.  No significant change since previous study.   Original Report Authenticated By: Burman Nieves, M.D.   Dg Abd 2 Views  07/25/2012   *RADIOLOGY REPORT*  Clinical Data: Nausea, vomiting, diarrhea  ABDOMEN - 2 VIEW  Comparison: 07/15/2038  Findings: Left nephrostomy tube. Pigtail drainage catheter in pelvis. Bilateral ureteral stents through ileal conduit. Mild gaseous dilatation of the colon question ileus. Interval removal of rectal tube. No definite bowel wall thickening or free intraperitoneal air.  IMPRESSION: Postoperative colonic ileus.   Original Report Authenticated By: Loraine Leriche  Tyron Russell, M.D.    Assessment/Plan: Pelvic hematoma s/p drain placement 7/14, culture shows gram negative rods. Patient is now on Zosyn. Repeat CT 7/25 shows presacral pelvic hematoma unchanged in size compared to CT 7/21. Poor PO intake secondary to N/V, patient refused NGT, restarted TNA. Currently on remeron to help stimulate appetite.   Loose stools, repeat  C.difficile today.  Patient and patient's family requesting [facility] transfer early next week for further testing and second opinions.     LOS: 34 days    Cloretta Ned 07/26/2012 11:46 AM

## 2012-07-26 NOTE — Progress Notes (Signed)
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 Healed well initially and sent home 6/17. Loopogram 6/27 without internal leak.  2 - Fever / h/o Bacteruria - Pt with MRSA in urine by hospital UCX 03/2012 and again subsequently in blood. Now on Vancomycin daily per ID x 4-6 week total (nearing end of course). Most recent UCX, BCX negative, New set pending from 6/22. Pt readmitted with low-grade fever 6/22 to 100.7 and malaise, no sig leukocytosis. Now on Vanc + Zosyn emirically. UCX with yeast (trated diflucan x 3 days), BCX no growth to date.Pelvic fluid aspirate and no growth / final.  ID now following,and DC'd all ABX given eval except for current C. Diff treatment which finished and now CDiff negative 7/18. Repeat fever spike 7/24 with new BCX, UCX, Drain CX, C. Diff obtained and pending, Zosyn again emprically pending CX.  3 - Heme / Recent DVT + PE -incidental PE by CT 03/2012 now on Lovenox, DVT resolved by most recent LE duplex. Repeat CT-PE 6/23 without PE or active bleed from pelvis and Lovenox DC'd. Hgb drift to 7 6/24 and transfused 2 u with response to 8, transfused 2u additional 6/25 with increase to 10. Hematology consult [name]) also agrees no treatment dose anticoagulation at this point, but resume proph dosing since he is not ambulating much.  4 - Pelvic Fluid Collection - Pelvic fluid collection without rim-enhancement noted on CT from ER 6/22. Appears partially simple fluid with some dependent blood by HU analysis, no active extrav of blood or urine by imaging. IR aspiration performed 6/26 and fluid c/w hematoma only (not  purulent, not simple/drainable). Repeat aspiration with insertion of 84F pigtail performed 7/14 at which time calculated volume using ellipsoid approximation . Hematoma drain has steadily put out dark hematoma fluid and repeat imaging 7/21 and 7/24 with calculated volume of hematoma .  5 - Hemorrhoids / Lower GI Bleed - Long h/o hemorroids. Pt with worsened / friable bleeding hemorroids on admission that are quite bothersome. No prior specific intervention, likely exacerbated by pelvic fluid collection / hematoma. Gen Surg recs conservative measures and now nearly resolved clinically.  6 - Nutrition / Nausea / C. Difficile- Pt with some element of baseline GI issues with GERD and prior small esophageal polyp. Has had exacerbation of nausea in house and not meeting nutritional goals. GI involved and initial EGD + KUB with ileus / gastroparesis picture. While ileus resolving placed on TPN and NGT for symptom relief and to bridge him with nutrition which was resumed 7/11 following successful decompression with rectal tube place with aide of sigmoidoscopy. C. Diff found positive 7/5 and started on dual therapy with flagyl + vanc enemas with plan for 14 day course which has ended. Rectal tube DC'd 7/17. Pt still with refractory nausea without emesis and new CT 7/21 without sig ileus patter or clear obstruction. Repeat sigmoidoscopy 7/23 with much improved rectal tissues and decreased extrinsic compression. TPN restarted 7/24 as not meeting caloric goals.  7 - Disposition - PT eval has recommended DC to SNF when appropriate and family amenable.  PMH sig for obesity and left knee replacement, PE + DVT (now resolved). No CV disease.   Today Chad Olson complains new emesis. No more fevers since one  spike last PM. New CT without new fluid collections or evidence of worsened GI obstruction.  Objective: Vital signs in last 24 hours: Temp:  [98.6 F (37 C)-102.1 F (38.9 C)] 98.7 F (37.1 C) (07/25  0803) Pulse Rate:  [92-114] 92 (07/25 0636) Resp:  [16-20] 20 (07/25 0636) BP: (123-130)/(74-84) 130/84 mmHg (07/25 0636) SpO2:  [95 %-99 %] 99 % (07/25 0636) Last BM Date: 07/25/12 (frequent loose stools)  Intake/Output from previous day: 07/24 0701 - 07/25 0700 In: 2986.7 [I.V.:2886.7; IV Piggyback:100] Out: 1600 [Urine:1600] Intake/Output this shift:    General appearance: alert, cooperative and appears stated age Head: Normocephalic, without obvious abnormality, atraumatic Eyes: conjunctivae/corneas clear. PERRL, EOM's intact. Fundi benign. Ears: normal TM's and external ear canals both ears Nose: Nares normal. Septum midline. Mucosa normal. No drainage or sinus tenderness. Throat: lips, mucosa, and tongue normal; teeth and gums normal Neck: no adenopathy, no carotid bruit, no JVD, supple, symmetrical, trachea midline and thyroid not enlarged, symmetric, no tenderness/mass/nodules Back: symmetric, no curvature. ROM normal. No CVA tenderness. Resp: clear to auscultation bilaterally Chest wall: no tenderness Cardio: regular rate and rhythm, S1, S2 normal, no murmur, click, rub or gallop GI: soft, non-tender; bowel sounds normal; no masses,  no organomegaly and nausea wtih thin emesis during exam. Non-bilious. Male genitalia: normal Extremities: extremities normal, atraumatic, no cyanosis or edema Pulses: 2+ and symmetric Skin: Skin color, texture, turgor normal. No rashes or lesions Lymph nodes: Cervical, supraclavicular, and axillary nodes normal. Neurologic: Grossly normal Incision/Wound: RLQ urostomy pink / patent with bilateral bander stents in situ and clear urine. hematmoa drain site c/d/i and drain with continued dark blood output, Left neph tube site c/d/i. RUE PICC c/d/i w/o skin site erythema.  Lab Results:   Recent Labs  07/26/12 0427  WBC 10.5  HGB 13.4  HCT 40.7  PLT 199   BMET  Recent Labs  07/26/12 0427  NA 133*  K 2.9*  CL 101  CO2 20  GLUCOSE  147*  BUN 13  CREATININE 1.24  CALCIUM 9.4   PT/INR No results found for this basename: LABPROT, INR,  in the last 72 hours ABG No results found for this basename: PHART, PCO2, PO2, HCO3,  in the last 72 hours  Studies/Results: Ct Abdomen Pelvis W Contrast  07/26/2012   *RADIOLOGY REPORT*  Clinical Data: Fever.  Nausea and vomiting.  Diarrhea.  Pelvic hematoma.  CT ABDOMEN AND PELVIS WITH CONTRAST  Technique:  Multidetector CT imaging of the abdomen and pelvis was performed following the standard protocol during bolus administration of intravenous contrast.  Contrast: OMNIPAQUE IOHEXOL 300 MG/ML  SOLN  Comparison: 07/22/2012.  Findings: Dependent atelectasis in the lung bases.  The liver, spleen, gallbladder, pancreas, adrenal glands, abdominal aorta, inferior vena cava, and retroperitoneal lymph nodes are unremarkable and unchanged.  There is a right lower quadrant urinary conduit with bilateral ureteral stents.  Right percutaneous nephrostomy tube.  Renal collecting systems and ureters are decompressed.  Cysts in the kidneys bilaterally, largest on the right measuring 4.4 cm diameter.  The stomach and small bowel are decompressed.  Contrast material flows to the cecum without evidence of obstruction.  The colon is mildly dilated and fluid- filled with air-fluid levels consistent with liquid stool.  This can be seen with diarrhea.  No colonic wall thickening is appreciated.  No free air or free fluid in the abdomen.  Pelvis:  Persistent presacral/low pelvic hematoma, measuring about 12 x 6.3 x 10.6 cm.  Pigtail drainage catheter  via trans-sciatic approach remains in place and is located in the dependent portion of the hematoma.  No significant change in size.  No free pelvic fluid collections.  Prostate gland appears to be surgically absent. Bladder is surgically absent.  Bowel loops filled the space previously occupied by the bladder.  IMPRESSION: Presacral pelvic hematoma appears unchanged in  size.  Pigtail drainage catheter in place.  Surgical resection of the bladder with right lower quadrant ureteral conduit.  Bilateral ureteral stents and right percutaneous nephrostomy tube.  Liquid stool in the colon without wall thickening suggesting diarrhea.  No significant change since previous study.   Original Report Authenticated By: Burman Nieves, M.D.   Dg Abd 2 Views  07/25/2012   *RADIOLOGY REPORT*  Clinical Data: Nausea, vomiting, diarrhea  ABDOMEN - 2 VIEW  Comparison: 07/15/2038  Findings: Left nephrostomy tube. Pigtail drainage catheter in pelvis. Bilateral ureteral stents through ileal conduit. Mild gaseous dilatation of the colon question ileus. Interval removal of rectal tube. No definite bowel wall thickening or free intraperitoneal air.  IMPRESSION: Postoperative colonic ileus.   Original Report Authenticated By: Ulyses Southward, M.D.    Anti-infectives: Anti-infectives   Start     Dose/Rate Route Frequency Ordered Stop   07/25/12 2100  piperacillin-tazobactam (ZOSYN) IVPB 3.375 g     3.375 g 12.5 mL/hr over 240 Minutes Intravenous Every 8 hours 07/25/12 2047     07/11/12 1200  vancomycin (VANCOCIN) 50 mg/mL oral solution 250 mg  Status:  Discontinued     250 mg Oral 4 times per day 07/11/12 1013 07/24/12 1159   07/06/12 1800  vancomycin (VANCOCIN) 500 mg in sodium chloride irrigation 0.9 % 100 mL ENEMA  Status:  Discontinued     500 mg Rectal 4 times per day 07/06/12 1335 07/11/12 1244   07/06/12 1430  metroNIDAZOLE (FLAGYL) IVPB 500 mg  Status:  Discontinued     500 mg 100 mL/hr over 60 Minutes Intravenous 3 times per day 07/06/12 1335 07/14/12 0725   06/25/12 1000  vancomycin (VANCOCIN) 1,250 mg in sodium chloride 0.9 % 250 mL IVPB  Status:  Discontinued     1,250 mg 166.7 mL/hr over 90 Minutes Intravenous Every 12 hours 06/25/12 0856 06/28/12 1102   06/24/12 2000  fluconazole (DIFLUCAN) IVPB 200 mg     200 mg 100 mL/hr over 60 Minutes Intravenous Every 24 hours 06/24/12  1733 06/26/12 2322   06/23/12 0900  vancomycin (VANCOCIN) 1,250 mg in sodium chloride 0.9 % 250 mL IVPB     1,250 mg 166.7 mL/hr over 90 Minutes Intravenous Every 12 hours 06/23/12 0522 06/24/12 2231   06/23/12 0600  piperacillin-tazobactam (ZOSYN) IVPB 3.375 g  Status:  Discontinued     3.375 g 12.5 mL/hr over 240 Minutes Intravenous 3 times per day 06/23/12 0447 06/28/12 1102   06/23/12 0330  cefTRIAXone (ROCEPHIN) 1 g in dextrose 5 % 50 mL IVPB     1 g 100 mL/hr over 30 Minutes Intravenous  Once 06/23/12 0319 06/23/12 0440      Assessment/Plan:  1 - Bladder Cancer / Cystectomy / Ureteral Revision- NED form cancer perspective.Loopogram without leak this admission. WIll consider  left neph tube removal under fluoro later this admission when back to baseline in terms of GI status.  2 - Fever / h/o Bacteruria - New CX'd obtained and pending. Continue Zosyn in interval. If keep spiking will reconsult medicine for opinion.  3 - Heme / Recent DVT + PE -Lovenox now stopped. SCD's in  place. PT following. Remain proph SQ heparin + SCD's.   4 - Pelvic Fluid Collection - Imaging and aspirate c/w non-infected hemaotma. Hematoma reimaging 7/24 with approx 60%+ volume reduction, would keep drain in place as long as putting out fluid to hasten resolution.  5 - Hemorrhoids / Lower GI Bleed - Agree with conservative measures for now. Hgb stable.  6 - Nutrition / Nausea / C. Difficile- Remains central active issue.TPN restarted. CT w/o new mass / fluid collections but persistent mild colonic distension. Greatly appreciate GI recs and input.  7 - Disposition - Pt and family becoming increasingly and understandably frustrated with lack of progress. We have discussed role of tertiary / academic transfer in detail and I mentioned I am happy to arrange this, but would not recommend elective transfer over weekend. We all agree that will consider transfer early next week if not progressing. Also seek and  appreciate GI team input in this regard as well.  Replace K IV.  [facility], Chad Olson 07/26/2012

## 2012-07-26 NOTE — Progress Notes (Addendum)
PARENTERAL NUTRITION CONSULT NOTE - Follow Up  Pharmacy Consult for TNA Indication: Gastroparesis/ileus  No Known Allergies  Patient Measurements: Height: 5\' 11"  (180.3 cm) Weight: 220 lb 11.2 oz (100.109 kg) IBW/kg (Calculated) : 75.3   Vital Signs: Temp: 98.6 F (37 C) (07/25 0636) Temp src: Oral (07/25 0636) BP: 130/84 mmHg (07/25 0636) Pulse Rate: 92 (07/25 0636) Intake/Output from previous day: 07/24 0701 - 07/25 0700 In: 2986.7 [I.V.:2886.7; IV Piggyback:100] Out: 1600 [Urine:1600] Intake/Output from this shift:    Labs:  Recent Labs  07/26/12 0427  WBC 10.5  HGB 13.4  HCT 40.7  PLT 199     Recent Labs  07/26/12 0427  NA 133*  K 2.9*  CL 101  CO2 20  GLUCOSE 147*  BUN 13  CREATININE 1.24  CALCIUM 9.4  MG 1.7  PHOS 3.1  PROT 6.2  ALBUMIN 2.5*  AST 8  ALT 12  ALKPHOS 76  BILITOT 0.6  TRIG 109   Estimated Creatinine Clearance: 70.6 ml/min (by C-G formula based on Cr of 1.24).    Recent Labs  07/25/12 1151 07/25/12 1740 07/25/12 2146  GLUCAP 136* 107* 159*    Assessment:  67 YO M admitted 6/22 with abdominal pain s/p radical cystoprostatectomy for bladder cancer in Feb,2014. On 06/15/12 he required ureteral reimplantation into lleal conduit. Pelvic hematoma noted 6/22, IR aspiration and drain placed.  He had been unable to tolerate oral diet with persistent N/V, EGD done 7/1 revealed likely gastroparesis, abd films 7/2 consistent with ileus. NGT placed 7/2 with immediate brown-green drainage. Due to poor nutritional intake and ileus, TPN was started 7/2 PM and continued until 7/13 when pt appeared to be doing better with PO nutrition following successful decompression with rectal tube placement (since d/c'd 7/17).  As of 7/24, pt still with refractory nausea without emesis and new CT 7/21 without sig ileus patter or clear obstruction. Repeat sigmoidoscopy 7/23 shows improvement per urology.  Patient has not been making good progress with PO  nutrition and MD would like to resume TPN to meet 50% of nutritional goals while progressing.  TNA resumed 7/23 via PICC (placed 07/03/12).  Pt complaining of worsening nausea with emesis x several in addition to loose stools.  Colonic ileus noted on abdominal xray yesterday.  CT abdomen today shows that pelvic hematoma is unchanged in size.  Nutritional Goals:  - RD recs 7/21: 1950-2100 Kcal, 95-120g protein, 1.9-2.1 L fluid/day - Clinimix E 5/15 at a rate of 83ml/hr + 20% fat emulsion at 59ml/hr to provide: 48g/day protein, 1162Kcal/day = ~50% of nutritional goals  Current nutrition:  - Diet: regular diet + Ensure TID - TNA: Clinimix E 5/15 at 40 ml/hr + 20% fat emulsion at 10 ml/hr - mIVF:  D51/2NS + 20 mEq/L KCl @ 100 ml/hr  CBGs & Insulin requirements past 24 hours:  107-159, 1 unit given  Labs: Electrolytes: K+ 2.9.  KCl 40 mEq PO TID ordered but pt unable to tolerate.  Pt reporting diarrhea and several episodes of emesis.  MD ordered KCL 10 mEq IV x 4 runs.  Na 133.  Mag/Phos WNL.  Corr Ca 10.6. Renal Function: Scr increased to 1.24, f/u SCr in AM Hepatic Function: wnl Pre-Albumin: 16.6 (7/7) Triglycerides: wnl 161 (7/3), 110 (7/7), 109 (7/25) CBGs: ACHS SSI sensitive scale ordered.  Pt no longer having PO intake so will adjust CBG checks and SSI to q6h.   Plan:    Continue Clinimix E 5/15 at goal rate of 40  ml/hr.  TNA to contain IV fat emulsion 20% at 10 ml/hr daily.  Add multivitamin and trace elements to TNA daily, patient unable to take PO MVI.  Change CBGs and sensitive scale SSI to q6h.  Defer management of MIVF to MD.  Increased to 100 ml/hr by MD 7/24.  F/u BMET this afternoon to determine if further K+ replacement is needed.  TNA labs Monday/Thursdays.  BMET in AM.  Pharmacy will follow up daily.    Clance Boll, PharmD, BCPS Pager: [phone] 07/26/2012 8:01 AM   Addendum: 07/26/2012 2:02 PM K+ 3.2 following KCl 10 mEq/58mL IV x 4 runs Plan: Order  an additional KCl 10 mEq/86mL IV x 4 runs.  Order for KCl 40 mEq PO TID still active as well but patient has been refusing most of these doses (cannot currently tolerate).  F/u BMET in AM and will continue to follow to help maintain K+ 4.0 in presence of ileus.  Clance Boll, PharmD, BCPS Pager: [phone] 07/26/2012 2:05 PM

## 2012-07-27 LAB — URINE CULTURE: Colony Count: >=100000

## 2012-07-27 LAB — BASIC METABOLIC PANEL
BUN: 11 mg/dL (ref 6–23)
Chloride: 104 mEq/L (ref 96–112)
GFR calc Af Amer: 77 mL/min — ABNORMAL LOW (ref >90)
GFR calc non Af Amer: 67 mL/min — ABNORMAL LOW (ref >90)
Potassium: 2.9 mEq/L — ABNORMAL LOW (ref 3.5–5.1)
Sodium: 135 mEq/L (ref 135–145)

## 2012-07-27 LAB — GLUCOSE, CAPILLARY
Glucose-Capillary: 162 mg/dL — ABNORMAL HIGH (ref 70–99)
Glucose-Capillary: 119 mg/dL — ABNORMAL HIGH (ref 70–99)

## 2012-07-27 MED ORDER — POTASSIUM CHLORIDE 10 MEQ/50ML IV SOLN
10.0000 meq | INTRAVENOUS | Status: AC
Start: 2012-07-27 — End: 2012-07-27
  Administered 2012-07-27 (×6): 10 meq via INTRAVENOUS
  Filled 2012-07-27 (×6): qty 50

## 2012-07-27 MED ORDER — POTASSIUM CHLORIDE 10 MEQ/50ML IV SOLN: 10.0000 meq | INTRAVENOUS | Status: DC

## 2012-07-27 MED ORDER — FAT EMULSION 20 % IV EMUL
240.0000 mL | INTRAVENOUS | Status: AC
  Administered 2012-07-27: 240 mL via INTRAVENOUS
  Filled 2012-07-27: qty 250

## 2012-07-27 MED ORDER — CLINIMIX E/DEXTROSE (5/15) 5 % IV SOLN
INTRAVENOUS | Status: AC
  Administered 2012-07-27: 19:00:00 via INTRAVENOUS
  Filled 2012-07-27: qty 1000

## 2012-07-27 NOTE — Progress Notes (Signed)
3 Days Post-Op  Subjective: Pt feeling about the same ; affect fairly flat; seems frustrated; cont to have diarrhea ( c diff neg), intermittent N/V; no sig abd pain ,CP or dyspnea  Objective: Vital signs in last 24 hours: Temp:  [97.8 F (36.6 C)-99.6 F (37.6 C)] 98.4 F (36.9 C) (07/26 0527) Pulse Rate:  [90-94] 90 (07/26 0527) Resp:  [18-28] 20 (07/26 0527) BP: (124-149)/(68-76) 149/68 mmHg (07/26 0527) SpO2:  [97 %] 97 % (07/26 0527) Last BM Date: 07/25/12 (frequent loose stools)  Intake/Output from previous day: 07/25 0701 - 07/26 0700 In: 1500 [I.V.:1145; IV Piggyback:350] Out: 2305 [Urine:2300; Drains:5] Intake/Output this shift:    TG/pelvic drain intact, output decreasing, dark bloody fluid in bulb, repeat cx's pending- gram neg rods ; gm( - )rods also in urine  Lab Results:   Recent Labs  07/26/12 0427  WBC 10.5  HGB 13.4  HCT 40.7  PLT 199   BMET  Recent Labs  07/26/12 1315 07/27/12 0400  NA 134* 135  K 3.2* 2.9*  CL 103 104  CO2 21 21  GLUCOSE 123* 155*  BUN 13 11  CREATININE 1.23 1.12  CALCIUM 9.2 9.2   PT/INR No results found for this basename: LABPROT, INR,  in the last 72 hours ABG No results found for this basename: PHART, PCO2, PO2, HCO3,  in the last 72 hours  Studies/Results: Ct Abdomen Pelvis W Contrast  07/26/2012   *RADIOLOGY REPORT*  Clinical Data: Fever.  Nausea and vomiting.  Diarrhea.  Pelvic hematoma.  CT ABDOMEN AND PELVIS WITH CONTRAST  Technique:  Multidetector CT imaging of the abdomen and pelvis was performed following the standard protocol during bolus administration of intravenous contrast.  Contrast: OMNIPAQUE IOHEXOL 300 MG/ML  SOLN  Comparison: 07/22/2012.  Findings: Dependent atelectasis in the lung bases.  The liver, spleen, gallbladder, pancreas, adrenal glands, abdominal aorta, inferior vena cava, and retroperitoneal lymph nodes are unremarkable and unchanged.  There is a right lower quadrant urinary conduit with  bilateral ureteral stents.  Right percutaneous nephrostomy tube.  Renal collecting systems and ureters are decompressed.  Cysts in the kidneys bilaterally, largest on the right measuring 4.4 cm diameter.  The stomach and small bowel are decompressed.  Contrast material flows to the cecum without evidence of obstruction.  The colon is mildly dilated and fluid- filled with air-fluid levels consistent with liquid stool.  This can be seen with diarrhea.  No colonic wall thickening is appreciated.  No free air or free fluid in the abdomen.  Pelvis:  Persistent presacral/low pelvic hematoma, measuring about 12 x 6.3 x 10.6 cm.  Pigtail drainage catheter via trans-sciatic approach remains in place and is located in the dependent portion of the hematoma.  No significant change in size.  No free pelvic fluid collections.  Prostate gland appears to be surgically absent. Bladder is surgically absent.  Bowel loops filled the space previously occupied by the bladder.  IMPRESSION: Presacral pelvic hematoma appears unchanged in size.  Pigtail drainage catheter in place.  Surgical resection of the bladder with right lower quadrant ureteral conduit.  Bilateral ureteral stents and right percutaneous nephrostomy tube.  Liquid stool in the colon without wall thickening suggesting diarrhea.  No significant change since previous study.   Original Report Authenticated By: Burman Nieves, M.D.   Dg Abd 2 Views  07/25/2012   *RADIOLOGY REPORT*  Clinical Data: Nausea, vomiting, diarrhea  ABDOMEN - 2 VIEW  Comparison: 07/15/2038  Findings: Left nephrostomy tube. Pigtail drainage  catheter in pelvis. Bilateral ureteral stents through ileal conduit. Mild gaseous dilatation of the colon question ileus. Interval removal of rectal tube. No definite bowel wall thickening or free intraperitoneal air.  IMPRESSION: Postoperative colonic ileus.   Original Report Authenticated By: Ulyses Southward, M.D.   Results for orders placed during the hospital  encounter of 06/22/12  CULTURE, BLOOD (ROUTINE X 2)     Status: None   Collection Time    06/22/12 10:43 PM      Result Value Range Status   Specimen Description BLOOD LEFT ANTECUBITAL   Final   Special Requests BOTTLES DRAWN AEROBIC AND ANAEROBIC 5CC   Final   Culture  Setup Time 06/23/2012 17:47   Final   Culture NO GROWTH 5 DAYS   Final   Report Status 06/29/2012 FINAL   Final  CULTURE, BLOOD (ROUTINE X 2)     Status: None   Collection Time    06/22/12 10:57 PM      Result Value Range Status   Specimen Description BLOOD LEFT ARM   Final   Special Requests BOTTLES DRAWN AEROBIC AND ANAEROBIC 10CC   Final   Culture  Setup Time 06/23/2012 17:47   Final   Culture NO GROWTH 5 DAYS   Final   Report Status 06/29/2012 FINAL   Final  URINE CULTURE     Status: None   Collection Time    06/23/12  2:17 AM      Result Value Range Status   Specimen Description URINE, CATHETERIZED   Final   Special Requests Normal   Final   Culture  Setup Time 06/23/2012 15:21   Final   Colony Count 35,000 COLONIES/ML   Final   Culture YEAST   Final   Report Status 06/24/2012 FINAL   Final  MRSA PCR SCREENING     Status: None   Collection Time    06/24/12  9:58 AM      Result Value Range Status   MRSA by PCR NEGATIVE  NEGATIVE Final   Comment:            The GeneXpert MRSA Assay (FDA     approved for NASAL specimens     only), is one component of a     comprehensive MRSA colonization     surveillance program. It is not     intended to diagnose MRSA     infection nor to guide or     monitor treatment for     MRSA infections.  CULTURE, ROUTINE-ABSCESS     Status: None   Collection Time    06/27/12 12:51 PM      Result Value Range Status   Specimen Description ABSCESS PELVIC   Final   Special Requests NONE   Final   Gram Stain     Final   Value: MODERATE WBC PRESENT, PREDOMINANTLY PMN     NO SQUAMOUS EPITHELIAL CELLS SEEN     NO ORGANISMS SEEN   Culture NO GROWTH 3 DAYS   Final   Report Status  08-09-2012 FINAL   Final  CLOSTRIDIUM DIFFICILE BY PCR     Status: Abnormal   Collection Time    07/06/12 10:05 AM      Result Value Range Status   C difficile by pcr POSITIVE (*) NEGATIVE Final   Comment: CRITICAL RESULT CALLED TO, READ BACK BY AND VERIFIED WITH:     E.HARLESS,RN [id number] 07/06/12 EHOWARD  CULTURE, ROUTINE-ABSCESS     Status: None  Collection Time    07/15/12  4:55 PM      Result Value Range Status   Specimen Description PERITONEAL CAVITY   Final   Special Requests NONE   Final   Gram Stain     Final   Value: NO WBC SEEN     NO SQUAMOUS EPITHELIAL CELLS SEEN     NO ORGANISMS SEEN   Culture NO GROWTH 3 DAYS   Final   Report Status 07/18/2012 FINAL   Final  BODY FLUID CULTURE     Status: None   Collection Time    07/18/12  3:13 PM      Result Value Range Status   Specimen Description OTHER PELVIC   Final   Special Requests Normal   Final   Gram Stain     Final   Value: RARE WBC PRESENT, PREDOMINANTLY PMN     NO ORGANISMS SEEN   Culture NO GROWTH 3 DAYS   Final   Report Status 07/21/2012 FINAL   Final  CLOSTRIDIUM DIFFICILE BY PCR     Status: None   Collection Time    07/19/12  9:31 AM      Result Value Range Status   C difficile by pcr NEGATIVE  NEGATIVE Final  URINE CULTURE     Status: None   Collection Time    07/25/12  4:45 PM      Result Value Range Status   Specimen Description URINE, CLEAN CATCH   Final   Special Requests NONE   Final   Culture  Setup Time 07/26/2012 00:20   Final   Colony Count >=100,000 COLONIES/ML   Final   Culture GRAM NEGATIVE RODS   Final   Report Status PENDING   Incomplete  BODY FLUID CULTURE     Status: None   Collection Time    07/25/12  4:57 PM      Result Value Range Status   Specimen Description OTHER PELVIC DRAINAGE   Final   Special Requests Normal   Final   Gram Stain     Final   Value: NO WBC SEEN     ABUNDANT GRAM NEGATIVE RODS     Gram Stain Report Called to,Read Back By and Verified With: Gram Stain Report  Called to,Read Back By and Verified With:  CAROL M @0713  ON 07/26/12 BY SMIAS   Culture Culture reincubated for better growth   Final   Report Status PENDING   Incomplete  CULTURE, BLOOD (ROUTINE X 2)     Status: None   Collection Time    07/25/12  6:20 PM      Result Value Range Status   Specimen Description BLOOD LEFT ARM   Final   Special Requests BOTTLES DRAWN AEROBIC AND ANAEROBIC 10CC   Final   Culture  Setup Time 07/25/2012 22:48   Final   Culture     Final   Value:        BLOOD CULTURE RECEIVED NO GROWTH TO DATE CULTURE WILL BE HELD FOR 5 DAYS BEFORE ISSUING A FINAL NEGATIVE REPORT   Report Status PENDING   Incomplete  CLOSTRIDIUM DIFFICILE BY PCR     Status: None   Collection Time    07/26/12 12:05 PM      Result Value Range Status   C difficile by pcr NEGATIVE  NEGATIVE Final    Anti-infectives: Anti-infectives   Start     Dose/Rate Route Frequency Ordered Stop   07/25/12 2100  piperacillin-tazobactam (ZOSYN) IVPB  3.375 g     3.375 g 12.5 mL/hr over 240 Minutes Intravenous Every 8 hours 07/25/12 2047     07/11/12 1200  vancomycin (VANCOCIN) 50 mg/mL oral solution 250 mg  Status:  Discontinued     250 mg Oral 4 times per day 07/11/12 1013 07/24/12 1159   07/06/12 1800  vancomycin (VANCOCIN) 500 mg in sodium chloride irrigation 0.9 % 100 mL ENEMA  Status:  Discontinued     500 mg Rectal 4 times per day 07/06/12 1335 07/11/12 1244   07/06/12 1430  metroNIDAZOLE (FLAGYL) IVPB 500 mg  Status:  Discontinued     500 mg 100 mL/hr over 60 Minutes Intravenous 3 times per day 07/06/12 1335 07/14/12 0725   06/25/12 1000  vancomycin (VANCOCIN) 1,250 mg in sodium chloride 0.9 % 250 mL IVPB  Status:  Discontinued     1,250 mg 166.7 mL/hr over 90 Minutes Intravenous Every 12 hours 06/25/12 0856 06/28/12 1102   06/24/12 2000  fluconazole (DIFLUCAN) IVPB 200 mg     200 mg 100 mL/hr over 60 Minutes Intravenous Every 24 hours 06/24/12 1733 06/26/12 2322   06/23/12 0900  vancomycin  (VANCOCIN) 1,250 mg in sodium chloride 0.9 % 250 mL IVPB     1,250 mg 166.7 mL/hr over 90 Minutes Intravenous Every 12 hours 06/23/12 0522 06/24/12 2231   06/23/12 0600  piperacillin-tazobactam (ZOSYN) IVPB 3.375 g  Status:  Discontinued     3.375 g 12.5 mL/hr over 240 Minutes Intravenous 3 times per day 06/23/12 0447 06/28/12 1102   06/23/12 0330  cefTRIAXone (ROCEPHIN) 1 g in dextrose 5 % 50 mL IVPB     1 g 100 mL/hr over 30 Minutes Intravenous  Once 06/23/12 0319 06/23/12 0440      Assessment/Plan: s/p pelvic hematoma drainage 7/14; check recent cx's/sens; replace K; cont drain irrigation;  f/u CT 7/25 with no sig change or acute findings; ambulate/OOB; other plans as per primary  LOS: 35 days    Allyson Tineo,D [facility] 07/27/2012

## 2012-07-27 NOTE — Progress Notes (Signed)
PARENTERAL NUTRITION CONSULT NOTE - Follow Up  Pharmacy Consult for TNA Indication: Gastroparesis/ileus  No Known Allergies  Patient Measurements: Height: 5\' 11"  (180.3 cm) Weight: 220 lb 11.2 oz (100.109 kg) IBW/kg (Calculated) : 75.3   Vital Signs: Temp: 98.4 F (36.9 C) (07/26 0527) Temp src: Oral (07/26 0527) BP: 149/68 mmHg (07/26 0527) Pulse Rate: 90 (07/26 0527) Intake/Output from previous day: 07/25 0701 - 07/26 0700 In: 1500 [I.V.:1145; IV Piggyback:350] Out: 2305 [Urine:2300; Drains:5] Intake/Output from this shift:    Labs:  Recent Labs  07/26/12 0427  WBC 10.5  HGB 13.4  HCT 40.7  PLT 199     Recent Labs  07/26/12 0427 07/26/12 1315 07/27/12 0400  NA 133* 134* 135  K 2.9* 3.2* 2.9*  CL 101 103 104  CO2 20 21 21   GLUCOSE 147* 123* 155*  BUN 13 13 11   CREATININE 1.24 1.23 1.12  CALCIUM 9.4 9.2 9.2  MG 1.7  --   --   PHOS 3.1  --   --   PROT 6.2  --   --   ALBUMIN 2.5*  --   --   AST 8  --   --   ALT 12  --   --   ALKPHOS 76  --   --   BILITOT 0.6  --   --   PREALBUMIN 11.1*  --   --   TRIG 109  --   --    Estimated Creatinine Clearance: 78.2 ml/min (by C-G formula based on Cr of 1.12).    Recent Labs  07/26/12 1644 07/26/12 2356 07/27/12 0457  GLUCAP 149* 138* 162*    Assessment:  67 YO M admitted 6/22 with abdominal pain s/p radical cystoprostatectomy for bladder cancer in Feb,2014. On 06/15/12 he required ureteral reimplantation into lleal conduit. Pelvic hematoma noted 6/22, IR aspiration and drain placed.  He had been unable to tolerate oral diet with persistent N/V, EGD done 7/1 revealed likely gastroparesis, abd films 7/2 consistent with ileus. NGT placed 7/2 with immediate brown-green drainage. Due to poor nutritional intake and ileus, TPN was started 7/2 PM and continued until 7/13 when pt appeared to be doing better with PO nutrition following successful decompression with rectal tube placement (since d/c'd 7/17).  As of 7/24,  pt still with refractory nausea without emesis and new CT 7/21 without sig ileus patter or clear obstruction. Repeat sigmoidoscopy 7/23 shows improvement per urology.  Patient has not been making good progress with PO nutrition and MD would like to resume TPN to meet 50% of nutritional goals while progressing.  TNA resumed 7/23 via PICC (placed 07/03/12).  Pt complaining of worsening nausea with emesis x several in addition to loose stools.  Colonic ileus noted on abdominal xray yesterday.  CT abdomen today shows that pelvic hematoma is unchanged in size.  Nutritional Goals:  - RD recs 7/21: 1950-2100 Kcal, 95-120g protein, 1.9-2.1 L fluid/day - Clinimix E 5/15 at a rate of 76ml/hr + 20% fat emulsion at 65ml/hr to provide: 48g/day protein, 1162Kcal/day = ~50% of nutritional goals  Current nutrition:  - Diet: regular diet + Ensure TID - TNA: Clinimix E 5/15 at 40 ml/hr + 20% fat emulsion at 10 ml/hr - mIVF:  D51/2NS + 20 mEq/L KCl @ 100 ml/hr  CBGs & Insulin requirements past 24 hours:  138-162, 6 units given  Labs: Electrolytes: K+ 2.9 despite 8 runs IV KCL ordered yesterday.  KCl 40 mEq PO TID ordered but pt unable to  tolerate.  Pt reporting diarrhea and several episodes of emesis - improving some per documentation Renal Function: Scr improved some to 1.12 Hepatic Function: wnl Pre-Albumin: 16.6 (7/7) Triglycerides: wnl 161 (7/3), 110 (7/7), 109 (7/25) CBGs: trending upward - will continue SSI for now  Plan:    Continue Clinimix E 5/15 at goal rate of 40 ml/hr.  TNA to contain IV fat emulsion 20% at 10 ml/hr daily.  Add multivitamin and trace elements to TNA daily, patient unable to take PO MVI.  Change CBGs and sensitive scale SSI to q6h.  Defer management of MIVF to MD.  Increased to 100 ml/hr by MD 7/24.  For K 2.9 - 6 runs of IV KCL  TNA labs Monday/Thursdays.  BMET in AM.  Pharmacy will follow up daily.    Hessie Knows, PharmD, BCPS Pager [phone] 07/27/2012 8:20  AM

## 2012-07-27 NOTE — Progress Notes (Signed)
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 Healed well initially and sent home 6/17. Loopogram 6/27 without internal leak. Bilaterl Bander stents in place and to remain until GI issues resolved. Also with left nephrostomy tube that is capped, being left in place in case needed.  2 - Fever / h/o Bacteruria - Pt with MRSA in urine by hospital UCX 03/2012 and again subsequently in blood. Now on Vancomycin daily per ID x 4-6 week total (nearing end of course). Most recent UCX, BCX negative, New set pending from 6/22. Pt readmitted with low-grade fever 6/22 to 100.7 and malaise, no sig leukocytosis. Now on Vanc + Zosyn emirically. UCX with yeast (trated diflucan x 3 days), BCX no growth to date.Pelvic fluid aspirate and no growth / final.  ID now following,and DC'd all ABX given eval except for current C. Diff treatment which finished and now CDiff negative 7/18. Repeat fever spike 7/24 with new BCX, UCX, Drain CX, C. Diff obtained and pending, Zosyn again emprically pending CX.  3 - Heme / Recent DVT + PE -incidental PE by CT 03/2012 now on Lovenox, DVT resolved by most recent LE duplex. Repeat CT-PE 6/23 without PE or active bleed from pelvis and Lovenox DC'd. Hgb drift to 7 6/24 and transfused 2 u with response to 8, transfused 2u additional 6/25 with increase to 10. Hematology consult [name]) also agrees no treatment dose anticoagulation at this point, but resume proph dosing since he is not ambulating much.  4 - Pelvic Fluid Collection - Pelvic fluid collection without rim-enhancement noted on CT from ER 6/22. Appears partially simple  fluid with some dependent blood by HU analysis, no active extrav of blood or urine by imaging. IR aspiration performed 6/26 and fluid c/w hematoma only (not purulent, not simple/drainable). Repeat aspiration with insertion of 70F pigtail performed 7/14 at which time calculated volume using ellipsoid approximation . Hematoma drain has steadily put out dark hematoma fluid and repeat imaging 7/21 and 7/24 with calculated volume of hematoma .  5 - Hemorrhoids / Lower GI Bleed - Long h/o hemorroids. Pt with worsened / friable bleeding hemorroids on admission that are quite bothersome. No prior specific intervention, likely exacerbated by pelvic fluid collection / hematoma. Gen Surg recs conservative measures and now nearly resolved clinically.  6 - Nutrition / Nausea / C. Difficile- Pt with some element of baseline GI issues with GERD and prior small esophageal polyp. Has had exacerbation of nausea in house and not meeting nutritional goals. GI involved and initial EGD + KUB with ileus / gastroparesis picture. While ileus resolving placed on TPN and NGT for symptom relief and to bridge him with nutrition which was resumed 7/11 following successful decompression with rectal tube place with aide of sigmoidoscopy. C. Diff found positive 7/5 and started on dual therapy with flagyl + vanc enemas with plan for 14 day course which has ended. Rectal tube DC'd 7/17. Pt still with refractory nausea without emesis and new CT 7/21 without sig ileus patter or clear obstruction. Repeat sigmoidoscopy 7/23 with much improved rectal tissues and decreased extrinsic compression. TPN restarted 7/24 as not meeting caloric goals.  7 - Disposition - PT eval has recommended DC to SNF when appropriate and family amenable.  PMH  sig for obesity and left knee replacement, PE + DVT (now resolved). No CV disease.   Today Chad Olson states his emesis and loose stools are somewhat improved, but still with diet-limiting nausea. They  continue to express desire for possible transfer next week.  Objective: Vital signs in last 24 hours: Temp:  [97.8 F (36.6 C)-99.6 F (37.6 C)] 98.4 F (36.9 C) (07/26 0527) Pulse Rate:  [90-94] 90 (07/26 0527) Resp:  [18-28] 20 (07/26 0527) BP: (124-149)/(68-76) 149/68 mmHg (07/26 0527) SpO2:  [97 %] 97 % (07/26 0527) Last BM Date: 07/25/12 (frequent loose stools)  Intake/Output from previous day: 07/25 0701 - 07/26 0700 In: 1500 [I.V.:1145; IV Piggyback:350] Out: 2305 [Urine:2300; Drains:5] Intake/Output this shift:    General appearance: alert, cooperative, appears stated age and wife and sister at bedside Head: Normocephalic, without obvious abnormality, atraumatic Eyes: conjunctivae/corneas clear. PERRL, EOM's intact. Fundi benign. Ears: normal TM's and external ear canals both ears Nose: Nares normal. Septum midline. Mucosa normal. No drainage or sinus tenderness. Throat: lips, mucosa, and tongue normal; teeth and gums normal Neck: no adenopathy, no carotid bruit, no JVD, supple, symmetrical, trachea midline and thyroid not enlarged, symmetric, no tenderness/mass/nodules Back: symmetric, no curvature. ROM normal. No CVA tenderness., left neph tube c/d/i and capped. Resp: clear to auscultation bilaterally Cardio: regular rate and rhythm, S1, S2 normal, no murmur, click, rub or gallop GI: soft, non-tender; bowel sounds normal; no masses,  no organomegaly Male genitalia: normal Extremities: extremities normal, atraumatic, no cyanosis or edema and RUE PICC site c/d/i. Pulses: 2+ and symmetric Skin: Skin color, texture, turgor normal. No rashes or lesions Lymph nodes: Cervical, supraclavicular, and axillary nodes normal. Neurologic: Grossly normal Incision/Wound: RLQ urostomy pink and patent with bander stents i place. Left buttocks hematoma drain with continued dark blood efflux that is non-purulent.  Lab Results:   Recent Labs  07/26/12 0427  WBC 10.5  HGB 13.4  HCT  40.7  PLT 199   BMET  Recent Labs  07/26/12 1315 07/27/12 0400  NA 134* 135  K 3.2* 2.9*  CL 103 104  CO2 21 21  GLUCOSE 123* 155*  BUN 13 11  CREATININE 1.23 1.12  CALCIUM 9.2 9.2   PT/INR No results found for this basename: LABPROT, INR,  in the last 72 hours ABG No results found for this basename: PHART, PCO2, PO2, HCO3,  in the last 72 hours  Studies/Results: Ct Abdomen Pelvis W Contrast  07/26/2012   *RADIOLOGY REPORT*  Clinical Data: Fever.  Nausea and vomiting.  Diarrhea.  Pelvic hematoma.  CT ABDOMEN AND PELVIS WITH CONTRAST  Technique:  Multidetector CT imaging of the abdomen and pelvis was performed following the standard protocol during bolus administration of intravenous contrast.  Contrast: OMNIPAQUE IOHEXOL 300 MG/ML  SOLN  Comparison: 07/22/2012.  Findings: Dependent atelectasis in the lung bases.  The liver, spleen, gallbladder, pancreas, adrenal glands, abdominal aorta, inferior vena cava, and retroperitoneal lymph nodes are unremarkable and unchanged.  There is a right lower quadrant urinary conduit with bilateral ureteral stents.  Right percutaneous nephrostomy tube.  Renal collecting systems and ureters are decompressed.  Cysts in the kidneys bilaterally, largest on the right measuring 4.4 cm diameter.  The stomach and small bowel are decompressed.  Contrast material flows to the cecum without evidence of obstruction.  The colon is mildly dilated and fluid- filled with air-fluid levels consistent with liquid stool.  This can be seen with diarrhea.  No colonic wall thickening is appreciated.  No  free air or free fluid in the abdomen.  Pelvis:  Persistent presacral/low pelvic hematoma, measuring about 12 x 6.3 x 10.6 cm.  Pigtail drainage catheter via trans-sciatic approach remains in place and is located in the dependent portion of the hematoma.  No significant change in size.  No free pelvic fluid collections.  Prostate gland appears to be surgically absent. Bladder  is surgically absent.  Bowel loops filled the space previously occupied by the bladder.  IMPRESSION: Presacral pelvic hematoma appears unchanged in size.  Pigtail drainage catheter in place.  Surgical resection of the bladder with right lower quadrant ureteral conduit.  Bilateral ureteral stents and right percutaneous nephrostomy tube.  Liquid stool in the colon without wall thickening suggesting diarrhea.  No significant change since previous study.   Original Report Authenticated By: Burman Nieves, M.D.   Dg Abd 2 Views  07/25/2012   *RADIOLOGY REPORT*  Clinical Data: Nausea, vomiting, diarrhea  ABDOMEN - 2 VIEW  Comparison: 07/15/2038  Findings: Left nephrostomy tube. Pigtail drainage catheter in pelvis. Bilateral ureteral stents through ileal conduit. Mild gaseous dilatation of the colon question ileus. Interval removal of rectal tube. No definite bowel wall thickening or free intraperitoneal air.  IMPRESSION: Postoperative colonic ileus.   Original Report Authenticated By: Ulyses Southward, M.D.    Anti-infectives: Anti-infectives   Start     Dose/Rate Route Frequency Ordered Stop   07/25/12 2100  piperacillin-tazobactam (ZOSYN) IVPB 3.375 g     3.375 g 12.5 mL/hr over 240 Minutes Intravenous Every 8 hours 07/25/12 2047     07/11/12 1200  vancomycin (VANCOCIN) 50 mg/mL oral solution 250 mg  Status:  Discontinued     250 mg Oral 4 times per day 07/11/12 1013 07/24/12 1159   07/06/12 1800  vancomycin (VANCOCIN) 500 mg in sodium chloride irrigation 0.9 % 100 mL ENEMA  Status:  Discontinued     500 mg Rectal 4 times per day 07/06/12 1335 07/11/12 1244   07/06/12 1430  metroNIDAZOLE (FLAGYL) IVPB 500 mg  Status:  Discontinued     500 mg 100 mL/hr over 60 Minutes Intravenous 3 times per day 07/06/12 1335 07/14/12 0725   06/25/12 1000  vancomycin (VANCOCIN) 1,250 mg in sodium chloride 0.9 % 250 mL IVPB  Status:  Discontinued     1,250 mg 166.7 mL/hr over 90 Minutes Intravenous Every 12 hours 06/25/12  0856 06/28/12 1102   06/24/12 2000  fluconazole (DIFLUCAN) IVPB 200 mg     200 mg 100 mL/hr over 60 Minutes Intravenous Every 24 hours 06/24/12 1733 06/26/12 2322   06/23/12 0900  vancomycin (VANCOCIN) 1,250 mg in sodium chloride 0.9 % 250 mL IVPB     1,250 mg 166.7 mL/hr over 90 Minutes Intravenous Every 12 hours 06/23/12 0522 06/24/12 2231   06/23/12 0600  piperacillin-tazobactam (ZOSYN) IVPB 3.375 g  Status:  Discontinued     3.375 g 12.5 mL/hr over 240 Minutes Intravenous 3 times per day 06/23/12 0447 06/28/12 1102   06/23/12 0330  cefTRIAXone (ROCEPHIN) 1 g in dextrose 5 % 50 mL IVPB     1 g 100 mL/hr over 30 Minutes Intravenous  Once 06/23/12 0319 06/23/12 0440      Assessment/Plan:  1 - Bladder Cancer / Cystectomy / Ureteral Revision- NED form cancer perspective.Loopogram without leak this admission. WIll consider  left neph tube removal under fluoro later this admission when back to baseline in terms of GI status.  2 - Fever / h/o Bacteruria - New CX'd obtained and  pending. Continue Zosyn in interval. If keep spiking will reconsult medicine for opinion.  3 - Heme / Recent DVT + PE -Lovenox now stopped. SCD's in place. PT following. Remain proph SQ heparin + SCD's.   4 - Pelvic Fluid Collection - Imaging and aspirate c/w non-infected hemaotma. Hematoma reimaging 7/24 with approx 60%+ volume reduction, would keep drain in place as long as putting out fluid to hasten resolution.  5 - Hemorrhoids / Lower GI Bleed - Agree with conservative measures for now. Hgb stable.  6 - Nutrition / Nausea / C. Difficile- Remains central active issue.TPN restarted. CT w/o new mass / fluid collections but persistent mild colonic distension. Greatly appreciate GI recs and input.  7 - Disposition - Will contact [facility] tomorrow about possible transfer early next week.  Replace K IV.  [facility], Cambryn Charters 07/27/2012

## 2012-07-28 LAB — BASIC METABOLIC PANEL
BUN: 10 mg/dL (ref 6–23)
Calcium: 9 mg/dL (ref 8.4–10.5)
Creatinine, Ser: 0.99 mg/dL (ref 0.50–1.35)
GFR calc Af Amer: >90 mL/min (ref >90)
GFR calc non Af Amer: 83 mL/min — ABNORMAL LOW (ref >90)
Glucose, Bld: 135 mg/dL — ABNORMAL HIGH (ref 70–99)

## 2012-07-28 LAB — GLUCOSE, CAPILLARY
Glucose-Capillary: 121 mg/dL — ABNORMAL HIGH (ref 70–99)
Glucose-Capillary: 140 mg/dL — ABNORMAL HIGH (ref 70–99)
Glucose-Capillary: 156 mg/dL — ABNORMAL HIGH (ref 70–99)

## 2012-07-28 MED ORDER — PANTOPRAZOLE SODIUM 40 MG PO TBEC
40.0000 mg | DELAYED_RELEASE_TABLET | Freq: Every day | ORAL | Status: DC
  Administered 2012-07-28 – 2012-08-09 (×13): 40 mg via ORAL
  Filled 2012-07-28 (×14): qty 1

## 2012-07-28 MED ORDER — POTASSIUM CHLORIDE 10 MEQ/50ML IV SOLN
10.0000 meq | INTRAVENOUS | Status: AC
  Administered 2012-07-28 (×4): 10 meq via INTRAVENOUS
  Filled 2012-07-28 (×4): qty 50

## 2012-07-28 MED ORDER — FAT EMULSION 20 % IV EMUL
240.0000 mL | INTRAVENOUS | Status: AC
  Administered 2012-07-28: 240 mL via INTRAVENOUS
  Filled 2012-07-28: qty 250

## 2012-07-28 MED ORDER — CLINIMIX E/DEXTROSE (5/15) 5 % IV SOLN
INTRAVENOUS | Status: AC
  Administered 2012-07-28: 17:00:00 via INTRAVENOUS
  Filled 2012-07-28: qty 1000

## 2012-07-28 NOTE — Progress Notes (Signed)
PARENTERAL NUTRITION CONSULT NOTE - Follow Up  Pharmacy Consult for TNA Indication: Gastroparesis/ileus  No Known Allergies  Patient Measurements: Height: 5\' 11"  (180.3 cm) Weight: 199 lb 11.8 oz (90.6 kg) IBW/kg (Calculated) : 75.3   Vital Signs: Temp: 98 F (36.7 C) (07/27 0500) Temp src: Oral (07/27 0500) BP: 132/73 mmHg (07/27 0500) Pulse Rate: 76 (07/27 0500) Intake/Output from previous day: 07/26 0701 - 07/27 0700 In: 4968.3 [P.O.:480; I.V.:3488.3; IV Piggyback:400; TPN:600] Out: 4976 [Urine:4950; Drains:22; Stool:4] Intake/Output from this shift:    Labs:  Recent Labs  07/26/12 0427  WBC 10.5  HGB 13.4  HCT 40.7  PLT 199     Recent Labs  07/26/12 0427 07/26/12 1315 07/27/12 0400 07/28/12 0420  NA 133* 134* 135 135  K 2.9* 3.2* 2.9* 3.3*  CL 101 103 104 105  CO2 20 21 21 21   GLUCOSE 147* 123* 155* 135*  BUN 13 13 11 10   CREATININE 1.24 1.23 1.12 0.99  CALCIUM 9.4 9.2 9.2 9.0  MG 1.7  --   --  1.9  PHOS 3.1  --   --  2.8  PROT 6.2  --   --   --   ALBUMIN 2.5*  --   --   --   AST 8  --   --   --   ALT 12  --   --   --   ALKPHOS 76  --   --   --   BILITOT 0.6  --   --   --   PREALBUMIN 11.1*  --   --   --   TRIG 109  --   --   --    Estimated Creatinine Clearance: 84.5 ml/min (by C-G formula based on Cr of 0.99).    Recent Labs  07/27/12 1731 07/28/12 0014 07/28/12 0555  GLUCAP 119* 136* 140*    Assessment:  67 YO M admitted 6/22 with abdominal pain s/p radical cystoprostatectomy for bladder cancer in Feb,2014. On 06/15/12 he required ureteral reimplantation into lleal conduit. Pelvic hematoma noted 6/22, IR aspiration and drain placed.  He had been unable to tolerate oral diet with persistent N/V, EGD done 7/1 revealed likely gastroparesis, abd films 7/2 consistent with ileus. NGT placed 7/2 with immediate brown-green drainage. Due to poor nutritional intake and ileus, TPN was started 7/2 PM and continued until 7/13 when pt appeared to be  doing better with PO nutrition following successful decompression with rectal tube placement (since d/c'd 7/17).  As of 7/24, pt still with refractory nausea without emesis and new CT 7/21 without sig ileus patter or clear obstruction. Repeat sigmoidoscopy 7/23 shows improvement per urology.  Patient has not been making good progress with PO nutrition and MD would like to resume TPN to meet 50% of nutritional goals while progressing.  TNA resumed 7/23 via PICC (placed 07/03/12).  Pt complaining of worsening nausea with emesis x several in addition to loose stools.  Colonic ileus noted on abdominal xray yesterday.  CT abdomen today shows that pelvic hematoma is unchanged in size.  Nutritional Goals:  - RD recs 7/21: 1950-2100 Kcal, 95-120g protein, 1.9-2.1 L fluid/day - Clinimix E 5/15 at a rate of 33ml/hr + 20% fat emulsion at 46ml/hr to provide: 48g/day protein, 1162Kcal/day = ~50% of nutritional goals  Current nutrition:  - Diet: regular diet + Ensure TID - TNA: Clinimix E 5/15 at 40 ml/hr + 20% fat emulsion at 10 ml/hr - mIVF:  D51/2NS + 20 mEq/L KCl @  100 ml/hr  CBGs & Insulin requirements past 24 hours:  119-162, 5 units given  Labs: Electrolytes: K+ improved slightly to 3.3 this AM after 6 runs IV KCL yesterday - will order replacement again this AM. Patient also receiving KCl 40 mEq PO TID.  RN reported several loose stools during yesterday which could contributing to lyte disturbances Renal Function: stable Hepatic Function: wnl Pre-Albumin: 16.6 (7/7), 11.1 (7/25) Triglycerides: wnl 161 (7/3), 110 (7/7), 109 (7/25) CBGs: stable with one outlier > 150 yesterday AM - will continue SSI for now  Plan:    Continue Clinimix E 5/15 at goal rate of 40 ml/hr.  TNA to contain IV fat emulsion 20% at 10 ml/hr daily.  Add multivitamin and trace elements to TNA daily, patient unable to take PO MVI.  Change CBGs and sensitive scale SSI to q6h.  Defer management of MIVF to MD.  Increased to  100 ml/hr by MD 7/24.  For K 3.3 - 4 runs of IV KCL  TNA labs Monday/Thursdays.  BMET in AM.  Pharmacy will follow up daily.    Hessie Knows, PharmD, BCPS Pager [phone] 07/28/2012 8:05 AM

## 2012-07-28 NOTE — Progress Notes (Signed)
Family member requesting that protonix be ordered

## 2012-07-28 NOTE — Progress Notes (Signed)
Requires much encouragement to turn deep breath and cough/IS,  Much education provided about walking at least 6 times a day and up to chair for all meals.  Pt refusing to walk tonight, and requires much assist to turn.  Encouraged to help me replace his leaking urostomy but will not assist me.  Pt has had several loose moderate to large liquid brown stools this shift.  Have given him imodium.

## 2012-07-28 NOTE — Progress Notes (Signed)
Pt stated that he had a formed soft BM this afternoon. Pt also has been up walking to and from the bathroom multiple times today. Pt states he feels much better today.

## 2012-07-28 NOTE — Progress Notes (Signed)
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 Healed well initially and sent home 6/17. Loopogram 6/27 without internal leak. Bilaterl Bander stents in place and to remain until GI issues resolved. Also with left nephrostomy tube that is capped, being left in place in case needed.  2 - Fever / h/o Bacteruria - Pt with MRSA in urine by hospital UCX 03/2012 and again subsequently in blood. Now on Vancomycin daily per ID x 4-6 week total (nearing end of course). Most recent UCX, BCX negative, New set pending from 6/22. Pt readmitted with low-grade fever 6/22 to 100.7 and malaise, no sig leukocytosis. Now on Vanc + Zosyn emirically. UCX with yeast (trated diflucan x 3 days), BCX no growth to date.Pelvic fluid aspirate and no growth / final.  ID now following,and DC'd all ABX given eval except for current C. Diff treatment which finished and now CDiff negative 7/18. Repeat fever spike 7/24 with new BCX (NGTD), UCX (klebsiella), Drain CX(low-growth GNR, pending), C. Diff (negative), Zosyn again emprically pending all final CX.  3 - Heme / Recent DVT + PE -incidental PE by CT 03/2012 now on Lovenox, DVT resolved by most recent LE duplex. Repeat CT-PE 6/23 without PE or active bleed from pelvis and Lovenox DC'd. Hgb drift to 7 6/24 and transfused 2 u with response to 8, transfused 2u additional 6/25 with increase to 10. Hematology consult [name]) also agrees no treatment dose anticoagulation at this point, but resume proph dosing since he is not ambulating much.  4 - Pelvic Fluid Collection - Pelvic fluid collection without rim-enhancement noted  on CT from ER 6/22. Appears partially simple fluid with some dependent blood by HU analysis, no active extrav of blood or urine by imaging. IR aspiration performed 6/26 and fluid c/w hematoma only (not purulent, not simple/drainable). Repeat aspiration with insertion of 65F pigtail performed 7/14 at which time calculated volume using ellipsoid approximation . Hematoma drain has steadily put out dark hematoma fluid and repeat imaging 7/21 and 7/24 with calculated volume of hematoma .  5 - Hemorrhoids / Lower GI Bleed - Long h/o hemorroids. Pt with worsened / friable bleeding hemorroids on admission that are quite bothersome. No prior specific intervention, likely exacerbated by pelvic fluid collection / hematoma. Gen Surg recs conservative measures and now nearly resolved clinically.  6 - Nutrition / Nausea / C. Difficile- Pt with some element of baseline GI issues with GERD and prior small esophageal polyp. Has had exacerbation of nausea in house and not meeting nutritional goals. GI involved and initial EGD + KUB with ileus / gastroparesis picture. While ileus resolving placed on TPN and NGT for symptom relief and to bridge him with nutrition which was resumed 7/11 following successful decompression with rectal tube place with aide of sigmoidoscopy. C. Diff found positive 7/5 and started on dual therapy with flagyl + vanc enemas with plan for 14 day course which has ended. Rectal tube DC'd 7/17. Pt still with refractory nausea without emesis and new CT 7/21 without sig ileus patter or clear obstruction. Repeat sigmoidoscopy 7/23 with much improved rectal tissues and decreased extrinsic compression. TPN restarted 7/24 as not meeting caloric goals.  7 - Disposition - PT eval has recommended DC to SNF when appropriate and  family amenable.  PMH sig for obesity and left knee replacement, PE + DVT (now resolved). No CV disease.   Today [facility] states his emesis is resolved and much less nausea. Loose  stools improving as well, but not resolved. Taking in some PO today.  Objective: Vital signs in last 24 hours: Temp:  [98 F (36.7 C)-98.5 F (36.9 C)] 98 F (36.7 C) (07/27 0500) Pulse Rate:  [67-83] 76 (07/27 0500) Resp:  [18-20] 20 (07/27 0500) BP: (120-132)/(71-80) 132/73 mmHg (07/27 0500) SpO2:  [97 %-98 %] 97 % (07/27 0500) Weight:  [90.6 kg (199 lb 11.8 oz)] 90.6 kg (199 lb 11.8 oz) (07/27 0500) Last BM Date: 07/27/12  Intake/Output from previous day: 07/26 0701 - 07/27 0700 In: 4968.3 [P.O.:480; I.V.:3488.3; IV Piggyback:400; TPN:600] Out: 4976 [Urine:4950; Drains:22; Stool:4] Intake/Output this shift:    General appearance: alert, cooperative, appears stated age and Family at bedside Head: Normocephalic, without obvious abnormality, atraumatic Eyes: conjunctivae/corneas clear. PERRL, EOM's intact. Fundi benign. Ears: normal TM's and external ear canals both ears Nose: Nares normal. Septum midline. Mucosa normal. No drainage or sinus tenderness. Throat: lips, mucosa, and tongue normal; teeth and gums normal Neck: no adenopathy, no carotid bruit, no JVD, supple, symmetrical, trachea midline and thyroid not enlarged, symmetric, no tenderness/mass/nodules Back: symmetric, no curvature. ROM normal. No CVA tenderness., left neph tube capped, c/d/i. Resp: clear to auscultation bilaterally Chest wall: no tenderness Cardio: regular rate and rhythm, S1, S2 normal, no murmur, click, rub or gallop GI: soft, non-tender; bowel sounds normal; no masses,  no organomegaly Male genitalia: normal Extremities: extremities normal, atraumatic, no cyanosis or edema and RUE PICC c/d/i. Pulses: 2+ and symmetric Skin: Skin color, texture, turgor normal. No rashes or lesions Lymph nodes: Cervical, supraclavicular, and axillary nodes normal. Neurologic: Grossly normal Incision/Wound: RLQ Urostomy pink / patent. Hematoma drain with dark bloody efflux.  Lab Results:   Recent Labs   07/26/12 0427  WBC 10.5  HGB 13.4  HCT 40.7  PLT 199   BMET  Recent Labs  07/27/12 0400 07/28/12 0420  NA 135 135  K 2.9* 3.3*  CL 104 105  CO2 21 21  GLUCOSE 155* 135*  BUN 11 10  CREATININE 1.12 0.99  CALCIUM 9.2 9.0   PT/INR No results found for this basename: LABPROT, INR,  in the last 72 hours ABG No results found for this basename: PHART, PCO2, PO2, HCO3,  in the last 72 hours  Studies/Results: No results found.  Anti-infectives: Anti-infectives   Start     Dose/Rate Route Frequency Ordered Stop   07/25/12 2100  piperacillin-tazobactam (ZOSYN) IVPB 3.375 g     3.375 g 12.5 mL/hr over 240 Minutes Intravenous Every 8 hours 07/25/12 2047     07/11/12 1200  vancomycin (VANCOCIN) 50 mg/mL oral solution 250 mg  Status:  Discontinued     250 mg Oral 4 times per day 07/11/12 1013 07/24/12 1159   07/06/12 1800  vancomycin (VANCOCIN) 500 mg in sodium chloride irrigation 0.9 % 100 mL ENEMA  Status:  Discontinued     500 mg Rectal 4 times per day 07/06/12 1335 07/11/12 1244   07/06/12 1430  metroNIDAZOLE (FLAGYL) IVPB 500 mg  Status:  Discontinued     500 mg 100 mL/hr over 60 Minutes Intravenous 3 times per day 07/06/12 1335 07/14/12 0725   06/25/12 1000  vancomycin (VANCOCIN) 1,250 mg in sodium chloride 0.9 % 250 mL IVPB  Status:  Discontinued     1,250 mg 166.7  mL/hr over 90 Minutes Intravenous Every 12 hours 06/25/12 0856 06/28/12 1102   06/24/12 2000  fluconazole (DIFLUCAN) IVPB 200 mg     200 mg 100 mL/hr over 60 Minutes Intravenous Every 24 hours 06/24/12 1733 06/26/12 2322   06/23/12 0900  vancomycin (VANCOCIN) 1,250 mg in sodium chloride 0.9 % 250 mL IVPB     1,250 mg 166.7 mL/hr over 90 Minutes Intravenous Every 12 hours 06/23/12 0522 06/24/12 2231   06/23/12 0600  piperacillin-tazobactam (ZOSYN) IVPB 3.375 g  Status:  Discontinued     3.375 g 12.5 mL/hr over 240 Minutes Intravenous 3 times per day 06/23/12 0447 06/28/12 1102   06/23/12 0330  cefTRIAXone  (ROCEPHIN) 1 g in dextrose 5 % 50 mL IVPB     1 g 100 mL/hr over 30 Minutes Intravenous  Once 06/23/12 0319 06/23/12 0440      Assessment/Plan:  1 - Bladder Cancer / Cystectomy / Ureteral Revision- NED form cancer perspective.Loopogram without leak this admission. WIll consider  left neph tube removal under fluoro later this admission when back to baseline in terms of GI status.  2 - Fever / h/o Bacteruria - New CX'd obtained and finals pending. Continue Zosyn in interval.   3 - Heme / Recent DVT + PE -Lovenox now stopped. SCD's in place. PT following. Remain proph SQ heparin + SCD's.   4 - Pelvic Fluid Collection - Imaging and aspirate c/w non-infected hemaotma. Hematoma reimaging 7/24 with approx 60%+ volume reduction, would keep drain in place as long as putting out fluid to hasten resolution.  5 - Hemorrhoids / Lower GI Bleed - Agree with conservative measures for now. Hgb stable.  6 - Nutrition / Nausea / C. Difficile- Appears to be making real progress last few days. No emesis in >24 hours, improving loose stools. Taking in PO with some apatite. TPN restarted and continue until clearly meeting at least 50% caloric goals PO.  Greatly appreciate GI recs and input.  7 - Disposition - Will contact [facility] later today about possible transfer early next week.  [facility], Chad Olson 07/28/2012
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
## 2012-07-29 LAB — COMPREHENSIVE METABOLIC PANEL
AST: 33 U/L (ref 0–37)
Albumin: 2.3 g/dL — ABNORMAL LOW (ref 3.5–5.2)
Alkaline Phosphatase: 145 U/L — ABNORMAL HIGH (ref 39–117)
BUN: 10 mg/dL (ref 6–23)
CO2: 21 mEq/L (ref 19–32)
Chloride: 105 mEq/L (ref 96–112)
GFR calc non Af Amer: 75 mL/min — ABNORMAL LOW (ref >90)
Potassium: 3.6 mEq/L (ref 3.5–5.1)
Total Bilirubin: 0.4 mg/dL (ref 0.3–1.2)

## 2012-07-29 LAB — GLUCOSE, CAPILLARY
Glucose-Capillary: 166 mg/dL — ABNORMAL HIGH (ref 70–99)
Glucose-Capillary: 171 mg/dL — ABNORMAL HIGH (ref 70–99)

## 2012-07-29 LAB — BODY FLUID CULTURE: Gram Stain: NONE SEEN

## 2012-07-29 LAB — DIFFERENTIAL
Basophils Relative: 1 % (ref 0–1)
Eosinophils Absolute: 0.1 10*3/uL (ref 0.0–0.7)
Eosinophils Relative: 2 % (ref 0–5)
Monocytes Absolute: 0.9 10*3/uL (ref 0.1–1.0)
Monocytes Relative: 15 % — ABNORMAL HIGH (ref 3–12)

## 2012-07-29 LAB — CBC
HCT: 42.3 % (ref 39.0–52.0)
Hemoglobin: 13.7 g/dL (ref 13.0–17.0)
MCH: 30.4 pg (ref 26.0–34.0)
MCHC: 32.4 g/dL (ref 30.0–36.0)

## 2012-07-29 MED ORDER — ADULT MULTIVITAMIN W/MINERALS CH
1.0000 | ORAL_TABLET | Freq: Every day | ORAL | Status: DC
  Administered 2012-07-30 – 2012-08-09 (×11): 1 via ORAL
  Filled 2012-07-29 (×11): qty 1

## 2012-07-29 MED ORDER — CIPROFLOXACIN IN D5W 400 MG/200ML IV SOLN
400.0000 mg | Freq: Two times a day (BID) | INTRAVENOUS | Status: DC
  Administered 2012-07-29 – 2012-08-07 (×18): 400 mg via INTRAVENOUS
  Filled 2012-07-29 (×19): qty 200

## 2012-07-29 MED ORDER — CLINIMIX E/DEXTROSE (5/15) 5 % IV SOLN
INTRAVENOUS | Status: AC
  Administered 2012-07-29: 17:00:00 via INTRAVENOUS
  Filled 2012-07-29: qty 1000

## 2012-07-29 MED ORDER — FAT EMULSION 20 % IV EMUL
240.0000 mL | INTRAVENOUS | Status: AC
  Administered 2012-07-29: 240 mL via INTRAVENOUS
  Filled 2012-07-29: qty 250

## 2012-07-29 MED ORDER — INSULIN ASPART 100 UNIT/ML ~~LOC~~ SOLN
0.0000 [IU] | Freq: Four times a day (QID) | SUBCUTANEOUS | Status: DC
  Administered 2012-07-29 (×2): 2 [IU] via SUBCUTANEOUS
  Administered 2012-07-29: 3 [IU] via SUBCUTANEOUS
  Administered 2012-07-30 – 2012-07-31 (×6): 2 [IU] via SUBCUTANEOUS
  Administered 2012-07-31 – 2012-08-01 (×2): 3 [IU] via SUBCUTANEOUS
  Administered 2012-08-01 (×2): 2 [IU] via SUBCUTANEOUS
  Administered 2012-08-01 – 2012-08-02 (×6): 3 [IU] via SUBCUTANEOUS
  Administered 2012-08-03 (×3): 2 [IU] via SUBCUTANEOUS
  Administered 2012-08-03: 3 [IU] via SUBCUTANEOUS
  Administered 2012-08-04: 5 [IU] via SUBCUTANEOUS
  Administered 2012-08-04: 2 [IU] via SUBCUTANEOUS
  Administered 2012-08-04 – 2012-08-05 (×3): 3 [IU] via SUBCUTANEOUS
  Administered 2012-08-05 (×2): 2 [IU] via SUBCUTANEOUS
  Administered 2012-08-05: 3 [IU] via SUBCUTANEOUS
  Administered 2012-08-06: 2 [IU] via SUBCUTANEOUS

## 2012-07-29 NOTE — Progress Notes (Signed)
5 Days Post-Op  Subjective: Pt feeling about the same, though seems in better spirits. Reports diarrhea has stopped and he is eating again. Objective: Vital signs in last 24 hours: Temp:  [97.6 F (36.4 C)-98.5 F (36.9 C)] 98.5 F (36.9 C) (07/28 0452) Pulse Rate:  [70-79] 79 (07/28 0452) Resp:  [18-20] 18 (07/28 0452) BP: (130-131)/(82-91) 130/91 mmHg (07/28 0452) SpO2:  [100 %] 100 % (07/28 0452) Last BM Date: 07/29/12  Intake/Output from previous day: 07/27 0701 - 07/28 0700 In: 5195 [P.O.:1180; I.V.:2576.7; IV Piggyback:150; TPN:1288.3] Out: 5159 [Urine:5150; Drains:9] Intake/Output this shift:    TG/pelvic drain intact, output decreasing, dark bloody fluid in bulb,  Lab Results:   Recent Labs  07/29/12 0450  WBC 5.7  HGB 13.7  HCT 42.3  PLT 200   BMET  Recent Labs  07/28/12 0420 07/29/12 0450  NA 135 136  K 3.3* 3.6  CL 105 105  CO2 21 21  GLUCOSE 135* 153*  BUN 10 10  CREATININE 0.99 1.01  CALCIUM 9.0 9.2   PT/INR No results found for this basename: LABPROT, INR,  in the last 72 hours ABG No results found for this basename: PHART, PCO2, PO2, HCO3,  in the last 72 hours  Studies/Results: No results found. Results for orders placed during the hospital encounter of 06/22/12  CULTURE, BLOOD (ROUTINE X 2)     Status: None   Collection Time    06/22/12 10:43 PM      Result Value Range Status   Specimen Description BLOOD LEFT ANTECUBITAL   Final   Special Requests BOTTLES DRAWN AEROBIC AND ANAEROBIC 5CC   Final   Culture  Setup Time 06/23/2012 17:47   Final   Culture NO GROWTH 5 DAYS   Final   Report Status 06/29/2012 FINAL   Final  CULTURE, BLOOD (ROUTINE X 2)     Status: None   Collection Time    06/22/12 10:57 PM      Result Value Range Status   Specimen Description BLOOD LEFT ARM   Final   Special Requests BOTTLES DRAWN AEROBIC AND ANAEROBIC 10CC   Final   Culture  Setup Time 06/23/2012 17:47   Final   Culture NO GROWTH 5 DAYS   Final   Report Status 06/29/2012 FINAL   Final  URINE CULTURE     Status: None   Collection Time    06/23/12  2:17 AM      Result Value Range Status   Specimen Description URINE, CATHETERIZED   Final   Special Requests Normal   Final   Culture  Setup Time 06/23/2012 15:21   Final   Colony Count 35,000 COLONIES/ML   Final   Culture YEAST   Final   Report Status 06/24/2012 FINAL   Final  MRSA PCR SCREENING     Status: None   Collection Time    06/24/12  9:58 AM      Result Value Range Status   MRSA by PCR NEGATIVE  NEGATIVE Final   Comment:            The GeneXpert MRSA Assay (FDA     approved for NASAL specimens     only), is one component of a     comprehensive MRSA colonization     surveillance program. It is not     intended to diagnose MRSA     infection nor to guide or     monitor treatment for     MRSA infections.  CULTURE, ROUTINE-ABSCESS     Status: None   Collection Time    06/27/12 12:51 PM      Result Value Range Status   Specimen Description ABSCESS PELVIC   Final   Special Requests NONE   Final   Gram Stain     Final   Value: MODERATE WBC PRESENT, PREDOMINANTLY PMN     NO SQUAMOUS EPITHELIAL CELLS SEEN     NO ORGANISMS SEEN   Culture NO GROWTH 3 DAYS   Final   Report Status 06/30/2012 FINAL   Final  CLOSTRIDIUM DIFFICILE BY PCR     Status: Abnormal   Collection Time    07/06/12 10:05 AM      Result Value Range Status   C difficile by pcr POSITIVE (*) NEGATIVE Final   Comment: CRITICAL RESULT CALLED TO, READ BACK BY AND VERIFIED WITH:     E.HARLESS,RN 1324 07/06/12 EHOWARD  CULTURE, ROUTINE-ABSCESS     Status: None   Collection Time    07/15/12  4:55 PM      Result Value Range Status   Specimen Description PERITONEAL CAVITY   Final   Special Requests NONE   Final   Gram Stain     Final   Value: NO WBC SEEN     NO SQUAMOUS EPITHELIAL CELLS SEEN     NO ORGANISMS SEEN   Culture NO GROWTH 3 DAYS   Final   Report Status 07/18/2012 FINAL   Final  BODY FLUID CULTURE      Status: None   Collection Time    07/18/12  3:13 PM      Result Value Range Status   Specimen Description OTHER PELVIC   Final   Special Requests Normal   Final   Gram Stain     Final   Value: RARE WBC PRESENT, PREDOMINANTLY PMN     NO ORGANISMS SEEN   Culture NO GROWTH 3 DAYS   Final   Report Status 07/21/2012 FINAL   Final  CLOSTRIDIUM DIFFICILE BY PCR     Status: None   Collection Time    07/19/12  9:31 AM      Result Value Range Status   C difficile by pcr NEGATIVE  NEGATIVE Final  URINE CULTURE     Status: None   Collection Time    07/25/12  4:45 PM      Result Value Range Status   Specimen Description URINE, CLEAN CATCH   Final   Special Requests NONE   Final   Culture  Setup Time 07/26/2012 00:20   Final   Colony Count >=100,000 COLONIES/ML   Final   Culture KLEBSIELLA PNEUMONIAE   Final   Report Status 07/27/2012 FINAL   Final   Organism ID, Bacteria KLEBSIELLA PNEUMONIAE   Final  BODY FLUID CULTURE     Status: None   Collection Time    07/25/12  4:57 PM      Result Value Range Status   Specimen Description OTHER PELVIC DRAINAGE   Final   Special Requests Normal   Final   Gram Stain     Final   Value: NO WBC SEEN     ABUNDANT GRAM NEGATIVE RODS     Gram Stain Report Called to,Read Back By and Verified With: Gram Stain Report Called to,Read Back By and Verified With:  CAROL M @0713  ON 07/26/12 BY SMIAS   Culture ABUNDANT PSEUDOMONAS AERUGINOSA   Final   Report Status 07/29/2012 FINAL   Final  Organism ID, Bacteria PSEUDOMONAS AERUGINOSA   Final  CULTURE, BLOOD (ROUTINE X 2)     Status: None   Collection Time    07/25/12  5:10 PM      Result Value Range Status   Specimen Description BLOOD LEFT ARM   Final   Special Requests BOTTLES DRAWN AEROBIC ONLY 1CC   Final   Culture  Setup Time 07/26/2012 01:20   Final   Culture     Final   Value:        BLOOD CULTURE RECEIVED NO GROWTH TO DATE CULTURE WILL BE HELD FOR 5 DAYS BEFORE ISSUING A FINAL NEGATIVE REPORT    Report Status PENDING   Incomplete  CULTURE, BLOOD (ROUTINE X 2)     Status: None   Collection Time    07/25/12  6:20 PM      Result Value Range Status   Specimen Description BLOOD LEFT ARM   Final   Special Requests BOTTLES DRAWN AEROBIC AND ANAEROBIC 10CC   Final   Culture  Setup Time 07/25/2012 22:48   Final   Culture     Final   Value:        BLOOD CULTURE RECEIVED NO GROWTH TO DATE CULTURE WILL BE HELD FOR 5 DAYS BEFORE ISSUING A FINAL NEGATIVE REPORT   Report Status PENDING   Incomplete  CLOSTRIDIUM DIFFICILE BY PCR     Status: None   Collection Time    07/26/12 12:05 PM      Result Value Range Status   C difficile by pcr NEGATIVE  NEGATIVE Final    Anti-infectives: Anti-infectives   Start     Dose/Rate Route Frequency Ordered Stop   07/25/12 2100  piperacillin-tazobactam (ZOSYN) IVPB 3.375 g     3.375 g 12.5 mL/hr over 240 Minutes Intravenous Every 8 hours 07/25/12 2047     07/11/12 1200  vancomycin (VANCOCIN) 50 mg/mL oral solution 250 mg  Status:  Discontinued     250 mg Oral 4 times per day 07/11/12 1013 07/24/12 1159   07/06/12 1800  vancomycin (VANCOCIN) 500 mg in sodium chloride irrigation 0.9 % 100 mL ENEMA  Status:  Discontinued     500 mg Rectal 4 times per day 07/06/12 1335 07/11/12 1244   07/06/12 1430  metroNIDAZOLE (FLAGYL) IVPB 500 mg  Status:  Discontinued     500 mg 100 mL/hr over 60 Minutes Intravenous 3 times per day 07/06/12 1335 07/14/12 0725   06/25/12 1000  vancomycin (VANCOCIN) 1,250 mg in sodium chloride 0.9 % 250 mL IVPB  Status:  Discontinued     1,250 mg 166.7 mL/hr over 90 Minutes Intravenous Every 12 hours 06/25/12 0856 06/28/12 1102   06/24/12 2000  fluconazole (DIFLUCAN) IVPB 200 mg     200 mg 100 mL/hr over 60 Minutes Intravenous Every 24 hours 06/24/12 1733 06/26/12 2322   06/23/12 0900  vancomycin (VANCOCIN) 1,250 mg in sodium chloride 0.9 % 250 mL IVPB     1,250 mg 166.7 mL/hr over 90 Minutes Intravenous Every 12 hours 06/23/12 0522  06/24/12 2231   06/23/12 0600  piperacillin-tazobactam (ZOSYN) IVPB 3.375 g  Status:  Discontinued     3.375 g 12.5 mL/hr over 240 Minutes Intravenous 3 times per day 06/23/12 0447 06/28/12 1102   06/23/12 0330  cefTRIAXone (ROCEPHIN) 1 g in dextrose 5 % 50 mL IVPB     1 g 100 mL/hr over 30 Minutes Intravenous  Once 06/23/12 0319 06/23/12 0440  Assessment/Plan: s/p pelvic hematoma drainage 7/14  f/u CT 7/25 with no sig change or acute findings; ambulate/OOB; other plans as per primary  LOS: 37 days    Brayton El 07/29/2012

## 2012-07-29 NOTE — Progress Notes (Signed)
Physical Therapy Treatment Patient Details Name: Chad Olson MRN: [id number] DOB: [birth year] Today's Date: 07/29/2012 Time: 6578-4696 PT Time Calculation (min): 26 min  PT Assessment / Plan / Recommendation  History of Present Illness     Clinical Impression Feeling better esp after TNA was restarted   PT Comments   Assisted pt OOB to amb in hallway.  Follow Up Recommendations  SNF     Does the patient have the potential to tolerate intense rehabilitation     Barriers to Discharge        Equipment Recommendations  None recommended by PT    Recommendations for Other Services    Frequency Min 3X/week   Progress towards PT Goals Progress towards PT goals: Progressing toward goals  Plan Current plan remains appropriate    Precautions / Restrictions Precautions Precautions: Fall Precaution Comments: transgluteal drain    Pertinent Vitals/Pain No c/o pain    Mobility  Bed Mobility Bed Mobility: Supine to Sit;Sit to Supine Supine to Sit: 5: Supervision;4: Min guard Sit to Supine: 4: Min guard;4: Min assist Details for Bed Mobility Assistance: increased time and increased assist back to bed due to fatigue Transfers Transfers: Sit to Stand;Stand to Sit Sit to Stand: 4: Min guard;4: Min assist;From bed;From chair/3-in-1 Stand to Sit: 4: Min guard;4: Min assist;To chair/3-in-1;To bed Details for Transfer Assistance: increased time Ambulation/Gait Ambulation/Gait Assistance: 4: Min guard;4: Min assist Ambulation Distance (Feet): 140 Feet Assistive device: Rolling walker Ambulation/Gait Assistance Details: chair following closely behind as pt fatigues quickly and at time c/o dizzyness.  Gait Pattern: Step-through pattern;Decreased stride length;Wide base of support;Trunk flexed Gait velocity: decreased     PT Goals (current goals can now be found in the care plan section)    Visit Information  Last PT Received On: 07/29/12 Assistance Needed: +2 (safety for amb  due to fatigue/dizzyness)    Subjective Data      Cognition       Balance     End of Session PT - End of Session Equipment Utilized During Treatment: Gait belt Activity Tolerance: Patient limited by fatigue Nurse Communication: Mobility status  [name]  PTA [facility]  Acute  Rehab Pager      [phone]

## 2012-07-29 NOTE — Progress Notes (Signed)
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 Healed well initially and sent home 6/17. Loopogram 6/27 without internal leak. Bilaterl Bander stents in place and to remain until GI issues resolved. Also with left nephrostomy tube that is capped, being left in place in case needed.  2 - Fever / h/o Bacteruria - Pt with MRSA in urine by hospital UCX 03/2012 and again subsequently in blood. Now on Vancomycin daily per ID x 4-6 week total (nearing end of course). Most recent UCX, BCX negative, New set pending from 6/22. Pt readmitted with low-grade fever 6/22 to 100.7 and malaise, no sig leukocytosis. Now on Vanc + Zosyn emirically. UCX with yeast (trated diflucan x 3 days), BCX no growth to date.Pelvic fluid aspirate and no growth / final.  ID now following,and DC'd all ABX given eval except for current C. Diff treatment which finished and now CDiff negative 7/18. Repeat fever spike 7/24 with new BCX (NGTD), UCX (klebsiella), Drain CX(low-growth GNR/psudomonas pending), C. Diff (negative), Zosyn again emprically pending all final CX.  3 - Heme / Recent DVT + PE -incidental PE by CT 03/2012 now on Lovenox, DVT resolved by most recent LE duplex. Repeat CT-PE 6/23 without PE or active bleed from pelvis and Lovenox DC'd. Hgb drift to 7 6/24 and transfused 2 u with response to 8, transfused 2u additional 6/25 with increase to 10. Hematology consult [name]) also agrees no treatment dose anticoagulation at this point, but resume proph dosing since he is not ambulating much.  4 - Pelvic Fluid Collection - Pelvic fluid collection without  rim-enhancement noted on CT from ER 6/22. Appears partially simple fluid with some dependent blood by HU analysis, no active extrav of blood or urine by imaging. IR aspiration performed 6/26 and fluid c/w hematoma only (not purulent, not simple/drainable). Repeat aspiration with insertion of 62F pigtail performed 7/14 at which time calculated volume using ellipsoid approximation . Hematoma drain has steadily put out dark hematoma fluid and repeat imaging 7/21 and 7/24 with calculated volume of hematoma .  5 - Hemorrhoids / Lower GI Bleed - Long h/o hemorroids. Pt with worsened / friable bleeding hemorroids on admission that are quite bothersome. No prior specific intervention, likely exacerbated by pelvic fluid collection / hematoma. Gen Surg recs conservative measures and now nearly resolved clinically.  6 - Nutrition / Nausea / C. Difficile- Pt with some element of baseline GI issues with GERD and prior small esophageal polyp. Has had exacerbation of nausea in house and not meeting nutritional goals. GI involved and initial EGD + KUB with ileus / gastroparesis picture. While ileus resolving placed on TPN and NGT for symptom relief and to bridge him with nutrition which was resumed 7/11 following successful decompression with rectal tube place with aide of sigmoidoscopy. C. Diff found positive 7/5 and started on dual therapy with flagyl + vanc enemas with plan for 14 day course which has ended. Rectal tube DC'd 7/17. Pt still with refractory nausea without emesis and new CT 7/21 without sig ileus patter or clear obstruction. Repeat sigmoidoscopy 7/23 with much improved rectal tissues and decreased extrinsic compression. TPN restarted 7/24 as not meeting caloric goals.  7 - Disposition - PT eval has recommended DC to SNF when appropriate and  family amenable. Family also interested in tertiary transfer, discussed with [facility] 7/27 Martin Majestic MD hospitalist transfer) and unwilling to accept at this time  but amenable to future transfer if failing to progress.  PMH sig for obesity and left knee replacement, PE + DVT (now resolved). No CV disease.   Today Chad Olson states his emesis has not returned, and he continues to have decreased loose stools. Had partially formed BM yesterday.  Objective: Vital signs in last 24 hours: Temp:  [97.6 F (36.4 C)-98.5 F (36.9 C)] 98.5 F (36.9 C) (07/28 0452) Pulse Rate:  [70-79] 79 (07/28 0452) Resp:  [18-20] 18 (07/28 0452) BP: (130-131)/(82-91) 130/91 mmHg (07/28 0452) SpO2:  [100 %] 100 % (07/28 0452) Last BM Date: 07/28/12  Intake/Output from previous day: 07/27 0701 - 07/28 0700 In: 1819.2 [P.O.:720; IV Piggyback:100; TPN:999.2] Out: 3858 [Urine:3850; Drains:8] Intake/Output this shift: Total I/O In: 769.8 [P.O.:240; IV Piggyback:50; TPN:479.8] Out: 1353 [Urine:1350; Drains:3]  General appearance: alert, cooperative and appears stated age Head: Normocephalic, without obvious abnormality, atraumatic Eyes: conjunctivae/corneas clear. PERRL, EOM's intact. Fundi benign. Ears: normal TM's and external ear canals both ears Nose: Nares normal. Septum midline. Mucosa normal. No drainage or sinus tenderness. Throat: lips, mucosa, and tongue normal; teeth and gums normal Neck: no adenopathy, no carotid bruit, no JVD, supple, symmetrical, trachea midline and thyroid not enlarged, symmetric, no tenderness/mass/nodules Back: symmetric, no curvature. ROM normal. No CVA tenderness. Resp: clear to auscultation bilaterally Chest wall: no tenderness Cardio: regular rate and rhythm, S1, S2 normal, no murmur, click, rub or gallop GI: soft, non-tender; bowel sounds normal; no masses,  no organomegaly Male genitalia: normal Extremities: extremities normal, atraumatic, no cyanosis or edema and RUE PICC c/d/i.  Pulses: 2+ and symmetric Skin: Skin color, texture, turgor normal. No rashes or lesions Lymph nodes: Cervical, supraclavicular, and axillary nodes  normal. Neurologic: Grossly normal WOUND: urostomy pink / patent. Hematoma drain with continued dark bloody efflux.  Lab Results:   Recent Labs  07/29/12 0450  WBC 5.7  HGB 13.7  HCT 42.3  PLT 200   BMET  Recent Labs  07/28/12 0420 07/29/12 0450  NA 135 136  K 3.3* 3.6  CL 105 105  CO2 21 21  GLUCOSE 135* 153*  BUN 10 10  CREATININE 0.99 1.01  CALCIUM 9.0 9.2   PT/INR No results found for this basename: LABPROT, INR,  in the last 72 hours ABG No results found for this basename: PHART, PCO2, PO2, HCO3,  in the last 72 hours  Studies/Results: No results found.  Anti-infectives: Anti-infectives   Start     Dose/Rate Route Frequency Ordered Stop   07/25/12 2100  piperacillin-tazobactam (ZOSYN) IVPB 3.375 g     3.375 g 12.5 mL/hr over 240 Minutes Intravenous Every 8 hours 07/25/12 2047     07/11/12 1200  vancomycin (VANCOCIN) 50 mg/mL oral solution 250 mg  Status:  Discontinued     250 mg Oral 4 times per day 07/11/12 1013 07/24/12 1159   07/06/12 1800  vancomycin (VANCOCIN) 500 mg in sodium chloride irrigation 0.9 % 100 mL ENEMA  Status:  Discontinued     500 mg Rectal 4 times per day 07/06/12 1335 07/11/12 1244   07/06/12 1430  metroNIDAZOLE (FLAGYL) IVPB 500 mg  Status:  Discontinued     500 mg 100 mL/hr over 60 Minutes Intravenous 3 times per day 07/06/12 1335 07/14/12 0725   06/25/12 1000  vancomycin (VANCOCIN) 1,250 mg in sodium chloride 0.9 % 250 mL  IVPB  Status:  Discontinued     1,250 mg 166.7 mL/hr over 90 Minutes Intravenous Every 12 hours 06/25/12 0856 06/28/12 1102   06/24/12 2000  fluconazole (DIFLUCAN) IVPB 200 mg     200 mg 100 mL/hr over 60 Minutes Intravenous Every 24 hours 06/24/12 1733 06/26/12 2322   06/23/12 0900  vancomycin (VANCOCIN) 1,250 mg in sodium chloride 0.9 % 250 mL IVPB     1,250 mg 166.7 mL/hr over 90 Minutes Intravenous Every 12 hours 06/23/12 0522 06/24/12 2231   06/23/12 0600  piperacillin-tazobactam (ZOSYN) IVPB 3.375 g   Status:  Discontinued     3.375 g 12.5 mL/hr over 240 Minutes Intravenous 3 times per day 06/23/12 0447 06/28/12 1102   06/23/12 0330  cefTRIAXone (ROCEPHIN) 1 g in dextrose 5 % 50 mL IVPB     1 g 100 mL/hr over 30 Minutes Intravenous  Once 06/23/12 0319 06/23/12 0440      Assessment/Plan:  1 - Bladder Cancer / Cystectomy / Ureteral Revision- NED form cancer perspective.Loopogram without leak this admission. WIll consider  left neph tube removal under fluoro later this admission when back to baseline in terms of GI status.  2 - Fever / h/o Bacteruria - New CX'd obtained and finals pending. Continue Zosyn in interval.   3 - Heme / Recent DVT + PE -Lovenox now stopped. SCD's in place. PT following. Remain proph SQ heparin + SCD's.   4 - Pelvic Fluid Collection - Imaging and aspirate c/w non-infected hemaotma. Hematoma reimaging 7/24 with approx 60%+ volume reduction, would keep drain in place as long as putting out fluid to hasten resolution.  5 - Hemorrhoids / Lower GI Bleed - Agree with conservative measures for now. Hgb stable.  6 - Nutrition / Nausea / C. Difficile- Continues to be making real progress last few days. No emesis in >48 hours, improving loose stools. Taking in PO with some apatite. TPN restarted and continue until clearly meeting at least 50% caloric goals PO.  Greatly appreciate GI recs and input.  7 - Disposition - Discussed that [facility] would not accept. Offered to call [facility] v. Give few days and recontact [facility] if starts declining again, I think this is reasonable.  [facility], Virdell Hoiland 07/29/2012

## 2012-07-29 NOTE — Progress Notes (Signed)
PARENTERAL NUTRITION CONSULT NOTE - Follow Up  Pharmacy Consult for TNA Indication: Gastroparesis/ileus  No Known Allergies  Patient Measurements: Height: 5\' 11"  (180.3 cm) Weight: 199 lb 11.8 oz (90.6 kg) IBW/kg (Calculated) : 75.3   Vital Signs: Temp: 98.5 F (36.9 C) (07/28 0452) Temp src: Oral (07/28 0452) BP: 130/91 mmHg (07/28 0452) Pulse Rate: 79 (07/28 0452) Intake/Output from previous day: 07/27 0701 - 07/28 0700 In: 5195 [P.O.:1180; I.V.:2576.7; IV Piggyback:150; TPN:1288.3] Out: 5159 [Urine:5150; Drains:9] Intake/Output from this shift:    Labs:  Recent Labs  07/29/12 0450  WBC 5.7  HGB 13.7  HCT 42.3  PLT 200     Recent Labs  07/27/12 0400 07/28/12 0420 07/29/12 0450  NA 135 135 136  K 2.9* 3.3* 3.6  CL 104 105 105  CO2 21 21 21   GLUCOSE 155* 135* 153*  BUN 11 10 10   CREATININE 1.12 0.99 1.01  CALCIUM 9.2 9.0 9.2  MG  --  1.9 2.0  PHOS  --  2.8 3.0  PROT  --   --  6.3  ALBUMIN  --   --  2.3*  AST  --   --  33  ALT  --   --  56*  ALKPHOS  --   --  145*  BILITOT  --   --  0.4  TRIG  --   --  133   Estimated Creatinine Clearance: 82.8 ml/min (by C-G formula based on Cr of 1.01).    Recent Labs  07/28/12 1829 07/28/12 2351 07/29/12 0534  GLUCAP 156* 121* 166*    Assessment:  67 YO M admitted 6/22 with abdominal pain s/p radical cystoprostatectomy for bladder cancer in Feb,2014. On 06/15/12 he required ureteral reimplantation into lleal conduit. Pelvic hematoma noted 6/22, IR aspiration and drain placed.  He had been unable to tolerate oral diet with persistent N/V, EGD done 7/1 revealed likely gastroparesis, abd films 7/2 consistent with ileus. NGT placed 7/2 with immediate brown-green drainage. Due to poor nutritional intake and ileus, TPN was started 7/2 PM and continued until 7/13 when pt appeared to be doing better with PO nutrition following successful decompression with rectal tube placement (since d/c'd 7/17).  As of 7/24, pt still  with refractory nausea without emesis and new CT 7/21 without sig ileus patter or clear obstruction. Repeat sigmoidoscopy 7/23 shows improvement per urology.  Patient has not been making good progress with PO nutrition and MD would like to resume TPN to meet 50% of nutritional goals while progressing.  TNA resumed 7/23 via PICC (placed 07/03/12).  Diarrhea resolved on Imodium.  Still with poor appetite, taking some liquids.  Remeron started to help stimulate appetite.   Nutritional Goals:  - RD re-estimated needs 7/25: 2080-2270 Kcal, 100-120g protein, 2.7 L fluid/day - Clinimix E 5/15 at a rate of 38ml/hr + 20% fat emulsion at 71ml/hr to provide: 48g/day protein, 1162Kcal/day = ~50% of nutritional goals  Current nutrition:  - Diet: regular diet + Ensure TID - TNA: Clinimix E 5/15 at 40 ml/hr + 20% fat emulsion at 10 ml/hr - mIVF:  D51/2NS + 20 mEq/L KCl @ 100 ml/hr  CBGs & Insulin requirements past 24 hours:  121-166, 7 units given  Labs: Electrolytes: K+ 3.6. Patient on KCl 40 mEq PO TID and received 4 runs of KCl yesterday.  Diarrhea resolved. Renal Function: stable, good UOP Hepatic Function: AST/ALT 33/56, Alk Phos increasing Pre-Albumin: 16.6 (7/7), 11.1 (7/25) Triglycerides: WNL CBGs: most CBGs slightly above goal,  will adjust SSI to moderate scale  Plan:    Continue Clinimix E 5/15 at goal rate of 40 ml/hr.  TNA to contain IV fat emulsion 20% at 10 ml/hr daily.  Since patient appears to be tolerating PO meds, will remove MVI and trace elements from TNA and resume multivitamin with minerals tablet PO daily.  Adjust q6h SSI to moderate scale.  Defer management of MIVF to MD.  Increased to 100 ml/hr by MD 7/24.  TNA labs Monday/Thursdays.  BMET in AM.  Pharmacy will follow up daily.    Clance Boll, PharmD, BCPS Pager: [phone] 07/29/2012 10:50 AM

## 2012-07-29 NOTE — Progress Notes (Signed)
NUTRITION FOLLOW UP  Intervention:   Continue TPN to meet 50% of needs Continue Ensure Complete TID Encourage PO intake  Nutrition Dx:   Inadequate oral intake related to poor appetite/nausea/vomiting/diarrhea as evidenced by pt not eating; ongoing inadequate intake but, N/V and diarrhea reslved  Goal:   Pt to meet >/= 90% of their estimated nutrition needs; not met  Monitor:   TPN rate; at 40 ml/hr meeting ~50% of needs PO intake; none Bowel function; WNL Weight; 21 lb wt loss since admission Labs; low sodium, low potassium, low albumin, low prealbumin  Assessment:   Pt appears much better today and his mood is noticeably improved. Pt denies N/V and diarrhea at this time. He reports he has not eaten yet today but, he is about to drink an Ensure Complete. Encouraged pt to drink at least 2 Ensure daily and eat at least 25% of 3 meals daily in order to meet 50% of energy and protein needs PO. Pt has been started on an appetite stimulant.   Current TPN : Clinimix E 5/15 at a rate of 14ml/hr + 20% fat emulsion at 86ml/hr to provide: 48g/day protein, 1162Kcal/day = ~50% of nutritional goals.   Height: Ht Readings from Last 1 Encounters:  06/23/12 5\' 11"  (1.803 m)    Weight Status:   Wt Readings from Last 1 Encounters:  07/28/12 199 lb 11.8 oz (90.6 kg)    Re-estimated needs:  Kcal: 2080-2270 Protein: 100-120 grams Fluid: 2.7 L  Skin: incisions on abdomen and buttocks  Diet Order: General   Intake/Output Summary (Last 24 hours) at 07/29/12 1031 Last data filed at 07/29/12 0700  Gross per 24 hour  Intake 4834.98 ml  Output   5159 ml  Net -324.02 ml    Last BM: 7/28   Labs:   Recent Labs Lab 07/26/12 0427  07/27/12 0400 07/28/12 0420 07/29/12 0450  NA 133*  < > 135 135 136  K 2.9*  < > 2.9* 3.3* 3.6  CL 101  < > 104 105 105  CO2 20  < > 21 21 21   BUN 13  < > 11 10 10   CREATININE 1.24  < > 1.12 0.99 1.01  CALCIUM 9.4  < > 9.2 9.0 9.2  MG 1.7  --   --   1.9 2.0  PHOS 3.1  --   --  2.8 3.0  GLUCOSE 147*  < > 155* 135* 153*  < > = values in this interval not displayed.  CBG (last 3)   Recent Labs  07/28/12 1829 07/28/12 2351 07/29/12 0534  GLUCAP 156* 121* 166*    Scheduled Meds: . famotidine  20 mg Oral Daily  . feeding supplement  237 mL Oral TID WC  . heparin subcutaneous  5,000 Units Subcutaneous Q8H  . hydrocortisone   Rectal TID  . insulin aspart  0-9 Units Subcutaneous Q6H  . metoprolol  5 mg Intravenous Q6H  . mirtazapine  7.5 mg Oral QHS  . pantoprazole  40 mg Oral Daily  . piperacillin-tazobactam (ZOSYN)  IV  3.375 g Intravenous Q8H  . potassium chloride  40 mEq Oral TID    Continuous Infusions: . dextrose 5 % and 0.45 % NaCl with KCl 20 mEq/L 100 mL/hr at 07/29/12 0225  . Marland KitchenTPN (CLINIMIX-E) Adult 40 mL/hr at 07/28/12 1729   And  . fat emulsion 240 mL (07/28/12 1730)    Ian Malkin RD, LDN Inpatient Clinical Dietitian Pager: [phone] After Hours Pager: [phone]

## 2012-07-29 NOTE — Progress Notes (Signed)
[facility] Progress Note   Subjective  no diarrhea. Awoken him from sleep   Objective   Vital signs in last 24 hours: Temp:  [97.6 F (36.4 C)-98.5 F (36.9 C)] 98.5 F (36.9 C) (07/28 0452) Pulse Rate:  [70-79] 79 (07/28 0452) Resp:  [18-20] 18 (07/28 0452) BP: (130-131)/(82-91) 130/91 mmHg (07/28 0452) SpO2:  [100 %] 100 % (07/28 0452) Last BM Date: 07/28/12 General:    white male in NAD Heart:  Regular rate and rhythm; no murmurs Lungs: Respirations even and unlabored, lungs CTA bilaterally Abdomen:  Soft,  Nondistended, mild RLQ tenderness. Normal bowel sounds. Extremities:  Without edema. Neurologic:  Alert and oriented,  grossly normal neurologically. Psych:  Cooperative. Normal mood and affect.   Lab Results:  Recent Labs  07/29/12 0450  WBC 5.7  HGB 13.7  HCT 42.3  PLT 200   BMET  Recent Labs  07/27/12 0400 07/28/12 0420 07/29/12 0450  NA 135 135 136  K 2.9* 3.3* 3.6  CL 104 105 105  CO2 21 21 21   GLUCOSE 155* 135* 153*  BUN 11 10 10   CREATININE 1.12 0.99 1.01  CALCIUM 9.2 9.0 9.2   LFT  Recent Labs  07/29/12 0450  PROT 6.3  ALBUMIN 2.3*  AST 33  ALT 56*  ALKPHOS 145*  BILITOT 0.4      Assessment / Plan:   1. Complicated 67 y.o. male with rectal obstruction/ischemia secondary to pelvic hematoma. Hematoma drained by IR 07/15/12, repeat CTscan revealing decreased size of hematoma.  Repeat flex sigmoidoscopy 07/24/12 revealed inflamed but healing rectosigmoid colon. Pelvic fluid collection with GNR, on Zosyn.  2. Nausea, vomiting, diarrhea. Repeat CTscan 07/26/12 reveals liquids stool in colon but no colonic wall thickening, no significant changes. C-diff is negative. Diarrhea resolved on Imodium, he had a solid BM this am.  Still with poor appetite, taking some liquids.  Started on Remeron which may help appetite. No nausea this am. He is on TNA.    LOS: 37 days   Willette Cluster  07/29/2012, 8:10 AM   GI ATTENDING  Agree  with assessment as above. No new or unaddressed GI problems. Expect colonic mucosa to continue to improve with resolution of hematoma. No evidence for ongoing C. Diff infection. Anorexia multifactorial.Continue with supportive care. We are available if needed.  Wilhemina Bonito. Eda Keys., M.D. [facility] Division of Gastroenterology

## 2012-07-30 LAB — BASIC METABOLIC PANEL
BUN: 11 mg/dL (ref 6–23)
GFR calc Af Amer: >90 mL/min (ref >90)
GFR calc non Af Amer: 85 mL/min — ABNORMAL LOW (ref >90)
Potassium: 3.8 mEq/L (ref 3.5–5.1)
Sodium: 138 mEq/L (ref 135–145)

## 2012-07-30 LAB — GLUCOSE, CAPILLARY: Glucose-Capillary: 150 mg/dL — ABNORMAL HIGH (ref 70–99)

## 2012-07-30 MED ORDER — FLUCONAZOLE IN SODIUM CHLORIDE 400-0.9 MG/200ML-% IV SOLN
800.0000 mg | Freq: Once | INTRAVENOUS | Status: AC
  Administered 2012-07-30: 800 mg via INTRAVENOUS
  Filled 2012-07-30: qty 400

## 2012-07-30 MED ORDER — FAT EMULSION 20 % IV EMUL
240.0000 mL | INTRAVENOUS | Status: AC
  Administered 2012-07-30: 240 mL via INTRAVENOUS
  Filled 2012-07-30: qty 250

## 2012-07-30 MED ORDER — FLUCONAZOLE 200 MG PO TABS
200.0000 mg | ORAL_TABLET | ORAL | Status: DC
  Administered 2012-07-31 – 2012-08-08 (×9): 200 mg via ORAL
  Filled 2012-07-30 (×10): qty 1

## 2012-07-30 MED ORDER — CLINIMIX E/DEXTROSE (5/15) 5 % IV SOLN
INTRAVENOUS | Status: AC
  Administered 2012-07-30: 17:00:00 via INTRAVENOUS
  Filled 2012-07-30: qty 1000

## 2012-07-30 NOTE — Progress Notes (Signed)
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 Healed well initially and sent home 6/17. Loopogram 6/27 without internal leak. Bilaterl Bander stents in place and to remain until GI issues resolved. Also with left nephrostomy tube that is capped, being left in place in case needed.  2 - Fever / h/o Bacteruria - Pt with MRSA in urine by Olson UCX 03/2012 and again subsequently in blood. Now on Vancomycin daily per ID x 4-6 week total (nearing end of course). Most recent UCX, BCX negative, New set pending from 6/22. Pt readmitted with low-grade fever 6/22 to 100.7 and malaise, no sig leukocytosis. Now on Vanc + Zosyn emirically. UCX with yeast (trated diflucan x 3 days), BCX no growth to date.Pelvic fluid aspirate and no growth / final.  ID now following,and DC'd all ABX given eval except for current C. Diff treatment which finished and now CDiff negative 7/18. Repeat fever spike 7/24 with new BCX (NGTD), UCX (klebsiella), Drain CX(low-growth GNR/psudomonas pending), C. Diff (negative), Now on Cipro as narrowest coverage based on CX.  3 - Heme / Recent DVT + PE -incidental PE by CT 03/2012 now on Lovenox, DVT resolved by most recent LE duplex. Repeat CT-PE 6/23 without PE or active bleed from pelvis and Lovenox DC'd. Hgb drift to 7 6/24 and transfused 2 u with response to 8, transfused 2u additional 6/25 with increase to 10. Hematology consult Chad Olson) also agrees no treatment dose anticoagulation at this point, but resume proph dosing since he is not ambulating much.  4 - Pelvic Fluid Collection - Pelvic fluid collection without  rim-enhancement noted on CT from ER 6/22. Appears partially simple fluid with some dependent blood by HU analysis, no active extrav of blood or urine by imaging. IR aspiration performed 6/26 and fluid c/w hematoma only (not purulent, not simple/drainable). Repeat aspiration with insertion of 78F pigtail performed 7/14 at which time calculated volume using ellipsoid approximation . Hematoma drain has steadily put out dark hematoma fluid and repeat imaging 7/21 and 7/24 with calculated volume of hematoma .  5 - Hemorrhoids / Lower GI Bleed - Long h/o hemorroids. Pt with worsened / friable bleeding hemorroids on admission that are quite bothersome. No prior specific intervention, likely exacerbated by pelvic fluid collection / hematoma. Gen Surg recs conservative measures and now nearly resolved clinically.  6 - Nutrition / Nausea / C. Difficile- Pt with some element of baseline GI issues with GERD and prior small esophageal polyp. Has had exacerbation of nausea in house and not meeting nutritional goals. GI involved and initial EGD + KUB with ileus / gastroparesis picture. While ileus resolving placed on TPN and NGT for symptom relief and to bridge him with nutrition which was resumed 7/11 following successful decompression with rectal tube place with aide of sigmoidoscopy. C. Diff found positive 7/5 and started on dual therapy with flagyl + vanc enemas with plan for 14 day course which has ended. Rectal tube DC'd 7/17. Pt still with refractory nausea without emesis and new CT 7/21 without sig ileus patter or clear obstruction. Repeat sigmoidoscopy 7/23 with much improved rectal tissues and decreased extrinsic compression. TPN restarted 7/24 as not meeting caloric goals.  7 - Disposition - PT eval has recommended DC to SNF when  appropriate and family amenable. Family also interested in tertiary transfer, discussed with Chad Olson 7/27 Chad Olson hospitalist transfer) and unwilling to accept at this time  but amenable to future transfer if failing to progress.  PMH sig for obesity and left knee replacement, PE + DVT (now resolved). No CV disease.   Today Chad Olson continues to make slow progress. Still keeping things down, though not to regular diet yet. Walking more. Had loose stool x few w/o fecal incontinence.  Objective: Vital signs in last 24 hours: Temp:  [97.5 F (36.4 C)-98.2 F (36.8 C)] 97.9 F (36.6 C) (07/29 1403) Pulse Rate:  [80-92] 81 (07/29 1746) Resp:  [18-20] 18 (07/29 1403) BP: (113-137)/(73-77) 113/74 mmHg (07/29 1746) SpO2:  [97 %-100 %] 100 % (07/29 1403) Last BM Date: 07/30/12  Intake/Output from previous day: 07/28 0701 - 07/29 0700 In: 4677.5 [P.O.:720; I.V.:2371.7; IV Piggyback:400; TPN:1185.8] Out: 4170 [Urine:4150; Drains:20] Intake/Output this shift:    General appearance: alert, cooperative, appears stated age and family at bedside Head: Normocephalic, without obvious abnormality, atraumatic Eyes: conjunctivae/corneas clear. PERRL, EOM's intact. Fundi benign. Ears: normal TM's and external ear canals both ears Nose: Nares normal. Septum midline. Mucosa normal. No drainage or sinus tenderness. Throat: lips, mucosa, and tongue normal; teeth and gums normal Neck: no adenopathy, no carotid bruit, no JVD, supple, symmetrical, trachea midline and thyroid not enlarged, symmetric, no tenderness/mass/nodules Back: symmetric, no curvature. ROM normal. No CVA tenderness. Resp: clear to auscultation bilaterally Cardio: regular rate and rhythm, S1, S2 normal, no murmur, click, rub or gallop GI: soft, non-tender; bowel sounds normal; no masses,  no organomegaly Male genitalia: normal Extremities: extremities normal, atraumatic, no cyanosis or edema and RUE PICC c/d/i with TPN connected. Pulses: 2+ and symmetric Skin: Skin color, texture, turgor normal. No rashes or lesions Lymph nodes: Cervical, supraclavicular, and axillary nodes normal. Neurologic: Grossly  normal Incision/Wound: RLQ Urostomy c/d/i with bander stents in place. L buttocks hematoma drain with scant bloody output.  Lab Results:   Recent Labs  07/29/12 0450  WBC 5.7  HGB 13.7  HCT 42.3  PLT 200   BMET  Recent Labs  07/29/12 0450 07/30/12 0500  NA 136 138  K 3.6 3.8  CL 105 106  CO2 21 22  GLUCOSE 153* 140*  BUN 10 11  CREATININE 1.01 0.94  CALCIUM 9.2 8.8   PT/INR No results found for this basename: LABPROT, INR,  in the last 72 hours ABG No results found for this basename: PHART, PCO2, PO2, HCO3,  in the last 72 hours  Studies/Results: No results found.  Anti-infectives: Anti-infectives   Start     Dose/Rate Route Frequency Ordered Stop   07/29/12 1800  ciprofloxacin (CIPRO) IVPB 400 mg     400 mg 200 mL/hr over 60 Minutes Intravenous Every 12 hours 07/29/12 1706     07/25/12 2100  piperacillin-tazobactam (ZOSYN) IVPB 3.375 g  Status:  Discontinued     3.375 g 12.5 mL/hr over 240 Minutes Intravenous Every 8 hours 07/25/12 2047 07/29/12 1721   07/11/12 1200  vancomycin (VANCOCIN) 50 mg/mL oral solution 250 mg  Status:  Discontinued     250 mg Oral 4 times per day 07/11/12 1013 07/24/12 1159   07/06/12 1800  vancomycin (VANCOCIN) 500 mg in sodium chloride irrigation 0.9 % 100 mL ENEMA  Status:  Discontinued     500 mg Rectal 4 times per day 07/06/12 1335 07/11/12 1244   07/06/12 1430  metroNIDAZOLE (FLAGYL) IVPB 500 mg  Status:  Discontinued     500 mg 100 mL/hr over 60 Minutes Intravenous 3 times per day 07/06/12 1335 07/14/12 0725   06/25/12 1000  vancomycin (VANCOCIN) 1,250 mg in sodium chloride 0.9 % 250 mL IVPB  Status:  Discontinued     1,250 mg 166.7 mL/hr over 90 Minutes Intravenous Every 12 hours 06/25/12 0856 06/28/12 1102   06/24/12 2000  fluconazole (DIFLUCAN) IVPB 200 mg     200 mg 100 mL/hr over 60 Minutes Intravenous Every 24 hours 06/24/12 1733 06/26/12 2322   06/23/12 0900  vancomycin (VANCOCIN) 1,250 mg in sodium chloride 0.9 % 250  mL IVPB     1,250 mg 166.7 mL/hr over 90 Minutes Intravenous Every 12 hours 06/23/12 0522 06/24/12 2231   06/23/12 0600  piperacillin-tazobactam (ZOSYN) IVPB 3.375 g  Status:  Discontinued     3.375 g 12.5 mL/hr over 240 Minutes Intravenous 3 times per day 06/23/12 0447 06/28/12 1102   06/23/12 0330  cefTRIAXone (ROCEPHIN) 1 g in dextrose 5 % 50 mL IVPB     1 g 100 mL/hr over 30 Minutes Intravenous  Once 06/23/12 0319 06/23/12 0440      Assessment/Plan:  1 - Bladder Cancer / Cystectomy / Ureteral Revision- NED form cancer perspective.Loopogram without leak this admission. WIll consider  left neph tube removal under fluoro later this admission when back to baseline in terms of GI status.  2 - Fever / h/o Bacteruria - Continue Cipro IV until tolerating PO further.  3 - Heme / Recent DVT + PE -Lovenox now stopped. SCD's in place. PT following. Remain proph SQ heparin + SCD's.   4 - Pelvic Fluid Collection - Imaging and aspirate c/w non-infected hemaotma. Hematoma reimaging 7/24 with approx 60%+ volume reduction, would keep drain in place as long as putting out fluid to hasten resolution.  5 - Hemorrhoids / Lower GI Bleed - Agree with conservative measures for now. Hgb stable.  6 - Nutrition / Nausea / C. Difficile- Continues to be making slow progress. No emesis in several days, improving loose stools. Taking in PO with some apatite. TPN restarted and continue until clearly meeting at least 50% caloric goals PO.  Greatly appreciate GI recs and input.  7 - Disposition - May revisit role of transfer if current progress reverses.   [facility], Fortune Torosian 07/30/2012

## 2012-07-30 NOTE — Progress Notes (Signed)
PARENTERAL NUTRITION CONSULT NOTE - Follow Up  Pharmacy Consult for TNA Indication: Gastroparesis/ileus  No Known Allergies  Patient Measurements: Height: 5\' 11"  (180.3 cm) Weight: 199 lb 11.8 oz (90.6 kg) IBW/kg (Calculated) : 75.3   Vital Signs: Temp: 97.5 F (36.4 C) (07/29 1610) Temp src: Oral (07/29 0621) BP: 122/76 mmHg (07/29 0621) Pulse Rate: 80 (07/29 0645) Intake/Output from previous day: 07/28 0701 - 07/29 0700 In: 4677.5 [P.O.:720; I.V.:2371.7; IV Piggyback:400; TPN:1185.8] Out: 4170 [Urine:4150; Drains:20] Intake/Output from this shift: Total I/O In: -  Out: 2.5 [Drains:2.5]  Labs:  Recent Labs  07/29/12 0450  WBC 5.7  HGB 13.7  HCT 42.3  PLT 200     Recent Labs  07/28/12 0420 07/29/12 0450 07/30/12 0500  NA 135 136 138  K 3.3* 3.6 3.8  CL 105 105 106  CO2 21 21 22   GLUCOSE 135* 153* 140*  BUN 10 10 11   CREATININE 0.99 1.01 0.94  CALCIUM 9.0 9.2 8.8  MG 1.9 2.0  --   PHOS 2.8 3.0  --   PROT  --  6.3  --   ALBUMIN  --  2.3*  --   AST  --  33  --   ALT  --  56*  --   ALKPHOS  --  145*  --   BILITOT  --  0.4  --   PREALBUMIN  --  14.0*  --   TRIG  --  133  --    Estimated Creatinine Clearance: 89 ml/min (by C-G formula based on Cr of 0.94).    Recent Labs  07/29/12 1708 07/29/12 2341 07/30/12 0619  GLUCAP 122* 132* 150*    Assessment:  67 YO M admitted 6/22 with abdominal pain s/p radical cystoprostatectomy for bladder cancer in Feb,2014. On 06/15/12 he required ureteral reimplantation into lleal conduit. Pelvic hematoma noted 6/22, IR aspiration and drain placed.  He had been unable to tolerate oral diet with persistent N/V, EGD done 7/1 revealed likely gastroparesis, abd films 7/2 consistent with ileus. NGT placed 7/2 with immediate brown-green drainage. Due to poor nutritional intake and ileus, TPN was started 7/2 PM and continued until 7/13 when pt appeared to be doing better with PO nutrition following successful decompression  with rectal tube placement (since d/c'd 7/17).  As of 7/24, pt still with refractory nausea without emesis and new CT 7/21 without sig ileus patter or clear obstruction. Repeat sigmoidoscopy 7/23 shows improvement per urology.  Patient has not been making good progress with PO nutrition and MD would like to resume TPN to meet 50% of nutritional goals while progressing.  TNA resumed 7/23 via PICC (placed 07/03/12).  Diarrhea resolved on Imodium.  Still with poor appetite, taking some liquids. Refusing most Ensure doses. Remeron started to help stimulate appetite.   Nutritional Goals:  - RD re-estimated needs 7/25: 2080-2270 Kcal, 100-120g protein, 2.7 L fluid/day - Clinimix E 5/15 at a rate of 52ml/hr + 20% fat emulsion at 15ml/hr to provide: 48g/day protein, 1162Kcal/day = ~50% of nutritional goals  Current nutrition:  - Diet: regular diet + Ensure TID - TNA: Clinimix E 5/15 at 40 ml/hr + 20% fat emulsion at 10 ml/hr - mIVF:  D51/2NS + 20 mEq/L KCl @ 100 ml/hr  CBGs & Insulin requirements past 24 hours:  122-171, 9 units given  Labs: Electrolytes: K+ 3.8. Patient tolerating KCl 40 mEq PO TID and diarrhea resolved.  Other lytes WNL as well. Renal Function: stable, good UOP Hepatic Function: AST/ALT  33/56, Alk Phos increased yesterday. Pre-Albumin: 16.6 (7/7), 11.1 (7/25), 14.0 (7/28) Triglycerides: WNL CBGs: one CBG above goal yesterday prior to adjustment to moderate scale SSI.  CBGs at goal since switched to moderate scale.  Plan:    Continue Clinimix E 5/15 at goal rate of 40 ml/hr.  TNA to contain IV fat emulsion 20% at 10 ml/hr daily.  Multivitamin with minerals tablet PO daily.  Continue moderate scale SSI q6h.   Defer management of MIVF to MD.  Increased to 100 ml/hr by MD 7/24.  TNA labs Monday/Thursdays.  BMET in AM.  Pharmacy will follow up daily.    Clance Boll, PharmD, BCPS Pager: [phone] 07/30/2012 10:08 AM

## 2012-07-31 LAB — BASIC METABOLIC PANEL
BUN: 9 mg/dL (ref 6–23)
Chloride: 109 mEq/L (ref 96–112)
Creatinine, Ser: 0.9 mg/dL (ref 0.50–1.35)
GFR calc Af Amer: >90 mL/min (ref >90)

## 2012-07-31 LAB — CULTURE, BLOOD (ROUTINE X 2): Culture: NO GROWTH

## 2012-07-31 LAB — GLUCOSE, CAPILLARY
Glucose-Capillary: 125 mg/dL — ABNORMAL HIGH (ref 70–99)
Glucose-Capillary: 130 mg/dL — ABNORMAL HIGH (ref 70–99)
Glucose-Capillary: 138 mg/dL — ABNORMAL HIGH (ref 70–99)
Glucose-Capillary: 154 mg/dL — ABNORMAL HIGH (ref 70–99)

## 2012-07-31 MED ORDER — CLINIMIX E/DEXTROSE (5/15) 5 % IV SOLN
INTRAVENOUS | Status: AC
  Administered 2012-07-31: 17:00:00 via INTRAVENOUS
  Filled 2012-07-31: qty 1000

## 2012-07-31 MED ORDER — MEGESTROL ACETATE 400 MG/10ML PO SUSP
800.0000 mg | Freq: Every day | ORAL | Status: DC
  Administered 2012-07-31: 800 mg via ORAL
  Filled 2012-07-31 (×5): qty 20

## 2012-07-31 MED ORDER — FAT EMULSION 20 % IV EMUL
240.0000 mL | INTRAVENOUS | Status: AC
  Administered 2012-07-31: 240 mL via INTRAVENOUS
  Filled 2012-07-31: qty 250

## 2012-07-31 NOTE — Progress Notes (Signed)
Physical Therapy Treatment Patient Details Name: Chad Olson MRN: [id number] DOB: 1945/03/05 Today's Date: 07/31/2012 Time: 1201-1227 PT Time Calculation (min): 26 min  PT Assessment / Plan / Recommendation  History of Present Illness     PT Comments   Pt c/o poor sleep last night and multiple trips to BSC/toilet due to loose stools. Increased c/o fatigue and decreased amb distance this session.    Follow Up Recommendations  SNF     Does the patient have the potential to tolerate intense rehabilitation     Barriers to Discharge        Equipment Recommendations  None recommended by PT    Recommendations for Other Services    Frequency Min 3X/week   Progress towards PT Goals Progress towards PT goals: Progressing toward goals  Plan Current plan remains appropriate    Precautions / Restrictions Precautions Precautions: Fall Precaution Comments: transgluteal drain Restrictions Weight Bearing Restrictions: No    Pertinent Vitals/Pain No c/o pain    Mobility  Bed Mobility Bed Mobility: Supine to Sit;Sit to Supine Supine to Sit: 5: Supervision;4: Min guard Sit to Supine: 4: Min guard;4: Min assist Details for Bed Mobility Assistance: increased time and increased assist back to bed due to fatigue Transfers Transfers: Sit to Stand;Stand to Sit Sit to Stand: 4: Min guard;4: Min assist;From bed;From toilet;From chair/3-in-1 Stand to Sit: 4: Min guard;4: Min assist;To chair/3-in-1;To bed;To toilet Details for Transfer Assistance: increased time with <25% VC's on proper hand placement for increased safety Ambulation/Gait Ambulation/Gait Assistance: 4: Min guard;4: Min assist Ambulation Distance (Feet): 52 Feet Assistive device: Rolling walker Ambulation/Gait Assistance Details: decreased amb distance this session due to c/o poor sleep last night and multiple trips to BSC/toilet due to loose stools Gait Pattern: Step-through pattern;Decreased stride length;Wide base of  support;Trunk flexed Gait velocity: decreased      PT Goals (current goals can now be found in the care plan section)    Visit Information  Last PT Received On: 07/31/12 Assistance Needed: +2    Subjective Data      Cognition       Balance     End of Session PT - End of Session Equipment Utilized During Treatment: Gait belt Activity Tolerance: Patient limited by fatigue   [name]  PTA [facility]  Acute  Rehab Pager      [phone]

## 2012-07-31 NOTE — Progress Notes (Signed)
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 Healed well initially and sent home 6/17. Loopogram 6/27 without internal leak. Bilaterl Bander stents in place and to remain until GI issues resolved. Also with left nephrostomy tube that is capped, being left in place in case needed.  2 - Fever / h/o Bacteruria - Pt with MRSA in urine by hospital UCX 03/2012 and again subsequently in blood. Now on Vancomycin daily per ID x 4-6 week total (nearing end of course). Most recent UCX, BCX negative, New set pending from 6/22. Pt readmitted with low-grade fever 6/22 to 100.7 and malaise, no sig leukocytosis. Now on Vanc + Zosyn emirically. UCX with yeast (trated diflucan x 3 days), BCX no growth to date.Pelvic fluid aspirate and no growth / final.  ID now following,and DC'd all ABX given eval except for current C. Diff treatment which finished and now CDiff negative 7/18. Repeat fever spike 7/24 with new BCX (NGTD), UCX (klebsiella), Drain CX(low-growth psudomonas , C. Diff (negative), Now on Cipro as narrowest coverage based on CX and began Diflucan course for mild oral candidiasis.  3 - Heme / Recent DVT + PE -incidental PE by CT 03/2012 now on Lovenox, DVT resolved by most recent LE duplex. Repeat CT-PE 6/23 without PE or active bleed from pelvis and Lovenox DC'd. Hgb drift to 7 6/24 and transfused 2 u with response to 8, transfused 2u additional 6/25 with increase to 10. Hematology consult [name]) also agrees no treatment dose anticoagulation at this point, but resume proph dosing since he is not ambulating much.  4 - Pelvic Fluid Collection -  Pelvic fluid collection without rim-enhancement noted on CT from ER 6/22. Appears partially simple fluid with some dependent blood by HU analysis, no active extrav of blood or urine by imaging. IR aspiration performed 6/26 and fluid c/w hematoma only (not purulent, not simple/drainable). Repeat aspiration with insertion of 62F pigtail performed 7/14 at which time calculated volume using ellipsoid approximation . Hematoma drain has steadily put out dark hematoma fluid and repeat imaging 7/21 and 7/24 with calculated volume of hematoma .  5 - Hemorrhoids / Lower GI Bleed - Long h/o hemorroids. Pt with worsened / friable bleeding hemorroids on admission that are quite bothersome. No prior specific intervention, likely exacerbated by pelvic fluid collection / hematoma. Gen Surg recs conservative measures and now nearly resolved clinically.  6 - Nutrition / Nausea / C. Difficile- Pt with some element of baseline GI issues with GERD and prior small esophageal polyp. Has had exacerbation of nausea in house and not meeting nutritional goals. GI involved and initial EGD + KUB with ileus / gastroparesis picture. While ileus resolving placed on TPN and NGT for symptom relief and to bridge him with nutrition which was resumed 7/11 following successful decompression with rectal tube place with aide of sigmoidoscopy. C. Diff found positive 7/5 and started on dual therapy with flagyl + vanc enemas with plan for 14 day course which has ended. Rectal tube DC'd 7/17. Pt still with refractory nausea without emesis and new CT 7/21 without sig ileus patter or clear obstruction. Repeat sigmoidoscopy 7/23 with much improved rectal tissues and decreased extrinsic compression. TPN restarted 7/24 as not meeting caloric goals.  7 - Disposition -  PT eval has recommended DC to SNF when appropriate and family amenable. Family also interested in tertiary transfer, discussed with [facility] 7/27 Martin Majestic MD hospitalist transfer) and  unwilling to accept at this time but amenable to future transfer if failing to progress.  PMH sig for obesity and left knee replacement, PE + DVT (now resolved). No CV disease.   Today Chad Olson c/o some mild abd soreness w/o emesis or distention. He is overall feeling stronger and is amenable to more work with PT.  Objective: Vital signs in last 24 hours: Temp:  [97.8 F (36.6 C)-98.2 F (36.8 C)] 97.8 F (36.6 C) (07/30 1330) Pulse Rate:  [66-84] 66 (07/30 1330) Resp:  [18] 18 (07/30 1330) BP: (104-140)/(71-85) 114/85 mmHg (07/30 1330) SpO2:  [99 %-100 %] 99 % (07/30 1330) Last BM Date: 07/31/12  Intake/Output from previous day: 07/29 0701 - 07/30 0700 In: 3175 [P.O.:300; I.V.:1650; IV Piggyback:400; TPN:825] Out: 4310 [Urine:4300; Drains:10] Intake/Output this shift: Total I/O In: 960 [P.O.:960] Out: 1800 [Urine:1800]  General appearance: alert, cooperative, appears stated age and family at bedside Head: Normocephalic, without obvious abnormality, atraumatic Eyes: conjunctivae/corneas clear. PERRL, EOM's intact. Fundi benign. Ears: normal TM's and external ear canals both ears Nose: Nares normal. Septum midline. Mucosa normal. No drainage or sinus tenderness. Throat: lips, mucosa, and tongue normal; teeth and gums normal and mild oral candidiasis Neck: no adenopathy, no carotid bruit, no JVD, supple, symmetrical, trachea midline and thyroid not enlarged, symmetric, no tenderness/mass/nodules Back: symmetric, no curvature. ROM normal. No CVA tenderness. Resp: clear to auscultation bilaterally Chest wall: no tenderness Cardio: regular rate and rhythm, S1, S2 normal, no murmur, click, rub or gallop GI: soft, non-tender; bowel sounds normal; no masses,  no organomegaly and area of mild soreness in left hemi-abdomen w/o hernia / rebound / guarding / TTP. Male genitalia: normal Extremities: extremities normal, atraumatic, no cyanosis or edema and RUE PICC c/d/i. Pulses: 2+ and  symmetric Skin: Skin color, texture, turgor normal. No rashes or lesions Lymph nodes: Cervical, supraclavicular, and axillary nodes normal. Neurologic: Grossly normal Incision/Wound: hematoma drain in place with scan dark bloody output. Urostomy pink / patent. Left neph tube capped.  Lab Results:   Recent Labs  07/29/12 0450  WBC 5.7  HGB 13.7  HCT 42.3  PLT 200   BMET  Recent Labs  07/30/12 0500 07/31/12 0550  NA 138 139  K 3.8 4.0  CL 106 109  CO2 22 21  GLUCOSE 140* 157*  BUN 11 9  CREATININE 0.94 0.90  CALCIUM 8.8 9.1   PT/INR No results found for this basename: LABPROT, INR,  in the last 72 hours ABG No results found for this basename: PHART, PCO2, PO2, HCO3,  in the last 72 hours  Studies/Results: No results found.  Anti-infectives: Anti-infectives   Start     Dose/Rate Route Frequency Ordered Stop   07/31/12 1800  fluconazole (DIFLUCAN) tablet 200 mg     200 mg Oral Every 24 hours 07/30/12 1911 08/10/12 1759   07/30/12 2015  fluconazole (DIFLUCAN) IVPB 800 mg    Comments:  First dose oral candidiasis, then transition to lower dose PO.   800 mg 200 mL/hr over 120 Minutes Intravenous  Once 07/30/12 1911 07/30/12 2255   07/29/12 1800  ciprofloxacin (CIPRO) IVPB 400 mg     400 mg 200 mL/hr over 60 Minutes Intravenous Every 12 hours 07/29/12 1706     07/25/12 2100  piperacillin-tazobactam (ZOSYN) IVPB 3.375 g  Status:  Discontinued  3.375 g 12.5 mL/hr over 240 Minutes Intravenous Every 8 hours 07/25/12 2047 07/29/12 1721   07/11/12 1200  vancomycin (VANCOCIN) 50 mg/mL oral solution 250 mg  Status:  Discontinued     250 mg Oral 4 times per day 07/11/12 1013 07/24/12 1159   07/06/12 1800  vancomycin (VANCOCIN) 500 mg in sodium chloride irrigation 0.9 % 100 mL ENEMA  Status:  Discontinued     500 mg Rectal 4 times per day 07/06/12 1335 07/11/12 1244   07/06/12 1430  metroNIDAZOLE (FLAGYL) IVPB 500 mg  Status:  Discontinued     500 mg 100 mL/hr over 60  Minutes Intravenous 3 times per day 07/06/12 1335 07/14/12 0725   06/25/12 1000  vancomycin (VANCOCIN) 1,250 mg in sodium chloride 0.9 % 250 mL IVPB  Status:  Discontinued     1,250 mg 166.7 mL/hr over 90 Minutes Intravenous Every 12 hours 06/25/12 0856 06/28/12 1102   06/24/12 2000  fluconazole (DIFLUCAN) IVPB 200 mg     200 mg 100 mL/hr over 60 Minutes Intravenous Every 24 hours 06/24/12 1733 06/26/12 2322   06/23/12 0900  vancomycin (VANCOCIN) 1,250 mg in sodium chloride 0.9 % 250 mL IVPB     1,250 mg 166.7 mL/hr over 90 Minutes Intravenous Every 12 hours 06/23/12 0522 06/24/12 2231   06/23/12 0600  piperacillin-tazobactam (ZOSYN) IVPB 3.375 g  Status:  Discontinued     3.375 g 12.5 mL/hr over 240 Minutes Intravenous 3 times per day 06/23/12 0447 06/28/12 1102   06/23/12 0330  cefTRIAXone (ROCEPHIN) 1 g in dextrose 5 % 50 mL IVPB     1 g 100 mL/hr over 30 Minutes Intravenous  Once 06/23/12 0319 06/23/12 0440      Assessment/Plan:  1 - Bladder Cancer / Cystectomy / Ureteral Revision- NED form cancer perspective.Loopogram without leak this admission. WIll consider  left neph tube removal under fluoro later this admission when back to baseline in terms of GI status.  2 - Fever / h/o Bacteruria - Continue Cipro IV until tolerating PO further. Continue Diflucan for mild thrush.  3 - Heme / Recent DVT + PE -Lovenox now stopped. SCD's in place. PT following. Remain proph SQ heparin + SCD's.   4 - Pelvic Fluid Collection - Imaging and aspirate c/w non-infected hemaotma. Hematoma reimaging 7/24 with approx 60%+ volume reduction, would keep drain in place as long as putting out fluid to hasten resolution.  5 - Hemorrhoids / Lower GI Bleed - Agree with conservative measures for now. Hgb stable.  6 - Nutrition / Nausea / C. Difficile- Slowly progressing. Began megace today to help stimulate appetite.   7 - Disposition - May revisit role of transfer if current progress reverses. Reconsulted PT  and strongly encouraged PT to be aggressive in terms of regaining strength given his significant deconditioning.  [facility], Chad Olson 07/31/2012

## 2012-07-31 NOTE — Progress Notes (Signed)
PARENTERAL NUTRITION CONSULT NOTE - Follow Up  Pharmacy Consult for TNA Indication: Gastroparesis/ileus  No Known Allergies  Patient Measurements: Height: 5\' 11"  (180.3 cm) Weight: 199 lb 11.8 oz (90.6 kg) IBW/kg (Calculated) : 75.3   Vital Signs: Temp: 98.2 F (36.8 C) (07/30 0452) Temp src: Oral (07/30 0452) BP: 105/71 mmHg (07/30 0452) Pulse Rate: 84 (07/30 0452) Intake/Output from previous day: 07/29 0701 - 07/30 0700 In: 2935 [P.O.:60; I.V.:1650; IV Piggyback:400; TPN:825] Out: 4310 [Urine:4300; Drains:10] Intake/Output from this shift:    Labs:  Recent Labs  07/29/12 0450  WBC 5.7  HGB 13.7  HCT 42.3  PLT 200     Recent Labs  07/29/12 0450 07/30/12 0500 07/31/12 0550  NA 136 138 139  K 3.6 3.8 4.0  CL 105 106 109  CO2 21 22 21   GLUCOSE 153* 140* 157*  BUN 10 11 9   CREATININE 1.01 0.94 0.90  CALCIUM 9.2 8.8 9.1  MG 2.0  --   --   PHOS 3.0  --   --   PROT 6.3  --   --   ALBUMIN 2.3*  --   --   AST 33  --   --   ALT 56*  --   --   ALKPHOS 145*  --   --   BILITOT 0.4  --   --   PREALBUMIN 14.0*  --   --   TRIG 133  --   --    Estimated Creatinine Clearance: 93 ml/min (by C-G formula based on Cr of 0.9).    Recent Labs  07/30/12 1745 07/31/12 0006 07/31/12 0622  GLUCAP 141* 130* 154*    Assessment:  67 YO M admitted 6/22 with abdominal pain s/p radical cystoprostatectomy for bladder cancer in Feb,2014. On 06/15/12 he required ureteral reimplantation into lleal conduit. Pelvic hematoma noted 6/22, IR aspiration and drain placed.  He had been unable to tolerate oral diet with persistent N/V, EGD done 7/1 revealed likely gastroparesis, abd films 7/2 consistent with ileus. NGT placed 7/2 with immediate brown-green drainage. Due to poor nutritional intake and ileus, TPN was started 7/2 PM and continued until 7/13 when pt appeared to be doing better with PO nutrition following successful decompression with rectal tube placement (since d/c'd 7/17).   As of 7/24, pt still with refractory nausea without emesis and new CT 7/21 without sig ileus patter or clear obstruction. Repeat sigmoidoscopy 7/23 shows improvement per urology.  Patient has not been making good progress with PO nutrition and MD would like to resume TPN to meet 50% of nutritional goals while progressing.  TNA resumed 7/23 via PICC (placed 07/03/12).  Still with poor appetite, taking some liquids. Remeron started to help stimulate appetite. Still with few loose stools.  Nutritional Goals:  - RD re-estimated needs 7/25: 2080-2270 Kcal, 100-120g protein, 2.7 L fluid/day - Clinimix E 5/15 at a rate of 14ml/hr + 20% fat emulsion at 18ml/hr to provide: 48g/day protein, 1162Kcal/day = ~50% of nutritional goals  Current nutrition:  - Diet: regular diet + Ensure TID - TNA: Clinimix E 5/15 at 40 ml/hr + 20% fat emulsion at 10 ml/hr - mIVF:  D51/2NS + 20 mEq/L KCl @ 100 ml/hr  CBGs & Insulin requirements past 24 hours:  130-154, 9 units given  Labs: Electrolytes: K+ 4.0. Patient tolerating KCl 40 mEq PO TID and diarrhea resolved.  Other lytes WNL as well. Renal Function: stable, good UOP Hepatic Function: AST/ALT 33/56, Alk Phos increased yesterday. Pre-Albumin: 16.6 (  7/7), 11.1 (7/25), 14.0 (7/28) Triglycerides: WNL CBGs: one CBG above goal yesterday on moderate scale SSI.    Plan:    Continue Clinimix E 5/15 at goal rate of 40 ml/hr.  TNA to contain IV fat emulsion 20% at 10 ml/hr daily.  Multivitamin with minerals tablet PO daily.  Continue moderate scale SSI q6h.   Defer management of MIVF to MD.  Increased to 100 ml/hr by MD 7/24.  TNA labs Monday/Thursdays.    Pharmacy will follow up daily.    Clance Boll, PharmD, BCPS Pager: [phone] 07/31/2012 7:49 AM

## 2012-07-31 NOTE — Progress Notes (Signed)
7 Days Post-Op  Subjective: Pt feeling about the same, though seems in better spirits. Reports diarrhea has stopped and he is trying to eat some, but just has no appetite. Tol TNA  Objective: Vital signs in last 24 hours: Temp:  [97.8 F (36.6 C)-98.2 F (36.8 C)] 98.2 F (36.8 C) (07/30 0452) Pulse Rate:  [76-88] 82 (07/30 0750) Resp:  [18] 18 (07/30 0452) BP: (104-140)/(71-84) 109/76 mmHg (07/30 0750) SpO2:  [99 %-100 %] 99 % (07/30 0452) Last BM Date: 07/30/12  TG/pelvic drain intact, output remains dark bloody, but not much daily output.  Lab Results:   Recent Labs  07/29/12 0450  WBC 5.7  HGB 13.7  HCT 42.3  PLT 200   BMET  Recent Labs  07/30/12 0500 07/31/12 0550  NA 138 139  K 3.8 4.0  CL 106 109  CO2 22 21  GLUCOSE 140* 157*  BUN 11 9  CREATININE 0.94 0.90  CALCIUM 8.8 9.1   PT/INR No results found for this basename: LABPROT, INR,  in the last 72 hours ABG No results found for this basename: PHART, PCO2, PO2, HCO3,  in the last 72 hours  Studies/Results: No results found. Results for orders placed during the hospital encounter of 06/22/12  CULTURE, BLOOD (ROUTINE X 2)     Status: None   Collection Time    06/22/12 10:43 PM      Result Value Range Status   Specimen Description BLOOD LEFT ANTECUBITAL   Final   Special Requests BOTTLES DRAWN AEROBIC AND ANAEROBIC 5CC   Final   Culture  Setup Time 06/23/2012 17:47   Final   Culture NO GROWTH 5 DAYS   Final   Report Status 06/29/2012 FINAL   Final  CULTURE, BLOOD (ROUTINE X 2)     Status: None   Collection Time    06/22/12 10:57 PM      Result Value Range Status   Specimen Description BLOOD LEFT ARM   Final   Special Requests BOTTLES DRAWN AEROBIC AND ANAEROBIC 10CC   Final   Culture  Setup Time 06/23/2012 17:47   Final   Culture NO GROWTH 5 DAYS   Final   Report Status 06/29/2012 FINAL   Final  URINE CULTURE     Status: None   Collection Time    06/23/12  2:17 AM      Result Value Range  Status   Specimen Description URINE, CATHETERIZED   Final   Special Requests Normal   Final   Culture  Setup Time 06/23/2012 15:21   Final   Colony Count 35,000 COLONIES/ML   Final   Culture YEAST   Final   Report Status 06/24/2012 FINAL   Final  MRSA PCR SCREENING     Status: None   Collection Time    06/24/12  9:58 AM      Result Value Range Status   MRSA by PCR NEGATIVE  NEGATIVE Final   Comment:            The GeneXpert MRSA Assay (FDA     approved for NASAL specimens     only), is one component of a     comprehensive MRSA colonization     surveillance program. It is not     intended to diagnose MRSA     infection nor to guide or     monitor treatment for     MRSA infections.  CULTURE, ROUTINE-ABSCESS     Status: None   Collection  Time    06/27/12 12:51 PM      Result Value Range Status   Specimen Description ABSCESS PELVIC   Final   Special Requests NONE   Final   Gram Stain     Final   Value: MODERATE WBC PRESENT, PREDOMINANTLY PMN     NO SQUAMOUS EPITHELIAL CELLS SEEN     NO ORGANISMS SEEN   Culture NO GROWTH 3 DAYS   Final   Report Status 06/30/2012 FINAL   Final  CLOSTRIDIUM DIFFICILE BY PCR     Status: Abnormal   Collection Time    07/06/12 10:05 AM      Result Value Range Status   C difficile by pcr POSITIVE (*) NEGATIVE Final   Comment: CRITICAL RESULT CALLED TO, READ BACK BY AND VERIFIED WITH:     E.HARLESS,RN [id number] 07/06/12 EHOWARD  CULTURE, ROUTINE-ABSCESS     Status: None   Collection Time    07/15/12  4:55 PM      Result Value Range Status   Specimen Description PERITONEAL CAVITY   Final   Special Requests NONE   Final   Gram Stain     Final   Value: NO WBC SEEN     NO SQUAMOUS EPITHELIAL CELLS SEEN     NO ORGANISMS SEEN   Culture NO GROWTH 3 DAYS   Final   Report Status 07/18/2012 FINAL   Final  BODY FLUID CULTURE     Status: None   Collection Time    07/18/12  3:13 PM      Result Value Range Status   Specimen Description OTHER PELVIC   Final    Special Requests Normal   Final   Gram Stain     Final   Value: RARE WBC PRESENT, PREDOMINANTLY PMN     NO ORGANISMS SEEN   Culture NO GROWTH 3 DAYS   Final   Report Status 07/21/2012 FINAL   Final  CLOSTRIDIUM DIFFICILE BY PCR     Status: None   Collection Time    07/19/12  9:31 AM      Result Value Range Status   C difficile by pcr NEGATIVE  NEGATIVE Final  URINE CULTURE     Status: None   Collection Time    07/25/12  4:45 PM      Result Value Range Status   Specimen Description URINE, CLEAN CATCH   Final   Special Requests NONE   Final   Culture  Setup Time 07/26/2012 00:20   Final   Colony Count >=100,000 COLONIES/ML   Final   Culture KLEBSIELLA PNEUMONIAE   Final   Report Status 07/27/2012 FINAL   Final   Organism ID, Bacteria KLEBSIELLA PNEUMONIAE   Final  BODY FLUID CULTURE     Status: None   Collection Time    07/25/12  4:57 PM      Result Value Range Status   Specimen Description OTHER PELVIC DRAINAGE   Final   Special Requests Normal   Final   Gram Stain     Final   Value: NO WBC SEEN     ABUNDANT GRAM NEGATIVE RODS     Gram Stain Report Called to,Read Back By and Verified With: Gram Stain Report Called to,Read Back By and Verified With:  CAROL M @0713  ON 07/26/12 BY SMIAS   Culture ABUNDANT PSEUDOMONAS AERUGINOSA   Final   Report Status 07/29/2012 FINAL   Final   Organism ID, Bacteria PSEUDOMONAS AERUGINOSA   Final  CULTURE, BLOOD (ROUTINE X 2)     Status: None   Collection Time    07/25/12  5:10 PM      Result Value Range Status   Specimen Description BLOOD LEFT ARM   Final   Special Requests BOTTLES DRAWN AEROBIC ONLY 1CC   Final   Culture  Setup Time 07/26/2012 01:20   Final   Culture     Final   Value:        BLOOD CULTURE RECEIVED NO GROWTH TO DATE CULTURE WILL BE HELD FOR 5 DAYS BEFORE ISSUING A FINAL NEGATIVE REPORT   Report Status PENDING   Incomplete  CULTURE, BLOOD (ROUTINE X 2)     Status: None   Collection Time    07/25/12  6:20 PM      Result  Value Range Status   Specimen Description BLOOD LEFT ARM   Final   Special Requests BOTTLES DRAWN AEROBIC AND ANAEROBIC 10CC   Final   Culture  Setup Time 07/25/2012 22:48   Final   Culture NO GROWTH 5 DAYS   Final   Report Status 07/31/2012 FINAL   Final  CLOSTRIDIUM DIFFICILE BY PCR     Status: None   Collection Time    07/26/12 12:05 PM      Result Value Range Status   C difficile by pcr NEGATIVE  NEGATIVE Final    Anti-infectives: Anti-infectives   Start     Dose/Rate Route Frequency Ordered Stop   07/31/12 1800  fluconazole (DIFLUCAN) tablet 200 mg     200 mg Oral Every 24 hours 07/30/12 1911 08/10/12 1759   07/30/12 2015  fluconazole (DIFLUCAN) IVPB 800 mg    Comments:  First dose oral candidiasis, then transition to lower dose PO.   800 mg 200 mL/hr over 120 Minutes Intravenous  Once 07/30/12 1911 07/30/12 2255   07/29/12 1800  ciprofloxacin (CIPRO) IVPB 400 mg     400 mg 200 mL/hr over 60 Minutes Intravenous Every 12 hours 07/29/12 1706     07/25/12 2100  piperacillin-tazobactam (ZOSYN) IVPB 3.375 g  Status:  Discontinued     3.375 g 12.5 mL/hr over 240 Minutes Intravenous Every 8 hours 07/25/12 2047 07/29/12 1721   07/11/12 1200  vancomycin (VANCOCIN) 50 mg/mL oral solution 250 mg  Status:  Discontinued     250 mg Oral 4 times per day 07/11/12 1013 07/24/12 1159   07/06/12 1800  vancomycin (VANCOCIN) 500 mg in sodium chloride irrigation 0.9 % 100 mL ENEMA  Status:  Discontinued     500 mg Rectal 4 times per day 07/06/12 1335 07/11/12 1244   07/06/12 1430  metroNIDAZOLE (FLAGYL) IVPB 500 mg  Status:  Discontinued     500 mg 100 mL/hr over 60 Minutes Intravenous 3 times per day 07/06/12 1335 07/14/12 0725   06/25/12 1000  vancomycin (VANCOCIN) 1,250 mg in sodium chloride 0.9 % 250 mL IVPB  Status:  Discontinued     1,250 mg 166.7 mL/hr over 90 Minutes Intravenous Every 12 hours 06/25/12 0856 06/28/12 1102   06/24/12 2000  fluconazole (DIFLUCAN) IVPB 200 mg     200  mg 100 mL/hr over 60 Minutes Intravenous Every 24 hours 06/24/12 1733 06/26/12 2322   06/23/12 0900  vancomycin (VANCOCIN) 1,250 mg in sodium chloride 0.9 % 250 mL IVPB     1,250 mg 166.7 mL/hr over 90 Minutes Intravenous Every 12 hours 06/23/12 0522 06/24/12 2231   06/23/12 0600  piperacillin-tazobactam (ZOSYN) IVPB 3.375 g  Status:  Discontinued     3.375 g 12.5 mL/hr over 240 Minutes Intravenous 3 times per day 06/23/12 0447 06/28/12 1102   06/23/12 0330  cefTRIAXone (ROCEPHIN) 1 g in dextrose 5 % 50 mL IVPB     1 g 100 mL/hr over 30 Minutes Intravenous  Once 06/23/12 0319 06/23/12 0440      Assessment/Plan: s/p pelvic hematoma drainage 7/14  f/u CT 7/25 with no sig change or acute findings; ambulate/OOB; other plans as per primary Fri 8/1 would be one week since last CT, could repeat CT then to reassess hematoma. Also consider changing TNA to nocturnal schedule if he could tolerate, which may increase daytime appetite.   LOS: 39 days    Brayton El 07/31/2012

## 2012-08-01 LAB — GLUCOSE, CAPILLARY: Glucose-Capillary: 149 mg/dL — ABNORMAL HIGH (ref 70–99)

## 2012-08-01 LAB — COMPREHENSIVE METABOLIC PANEL
ALT: 30 U/L (ref 0–53)
AST: 14 U/L (ref 0–37)
Albumin: 2.4 g/dL — ABNORMAL LOW (ref 3.5–5.2)
CO2: 23 mEq/L (ref 19–32)
Chloride: 105 mEq/L (ref 96–112)
Creatinine, Ser: 0.85 mg/dL (ref 0.50–1.35)
Potassium: 4.1 mEq/L (ref 3.5–5.1)
Sodium: 138 mEq/L (ref 135–145)
Total Bilirubin: 0.3 mg/dL (ref 0.3–1.2)

## 2012-08-01 LAB — CULTURE, BLOOD (ROUTINE X 2)

## 2012-08-01 LAB — PHOSPHORUS: Phosphorus: 2.5 mg/dL (ref 2.3–4.6)

## 2012-08-01 MED ORDER — FAT EMULSION 20 % IV EMUL
240.0000 mL | INTRAVENOUS | Status: AC
  Administered 2012-08-01: 240 mL via INTRAVENOUS
  Filled 2012-08-01: qty 250

## 2012-08-01 MED ORDER — CLINIMIX E/DEXTROSE (5/15) 5 % IV SOLN
INTRAVENOUS | Status: AC
  Administered 2012-08-01: 18:00:00 via INTRAVENOUS
  Filled 2012-08-01: qty 1000

## 2012-08-01 NOTE — Progress Notes (Deleted)
Patient ID: Chad Olson, male   DOB: 12/28/1945, 67 y.o.   MRN: [id number]   Pt without complaints. Breathing well - no SOB.   Sitting on edge of bed eating breakfast. Urine clear  Imp/plan - s/p TURP with extraperitoneal bladder perforation -  -can use leg bag when ambulating, overnight/large bag when sleeping -f/u one week for cystogram -cephalexin BID (Rx on chart) -stable for d/c home from GU point of view

## 2012-08-01 NOTE — Progress Notes (Signed)
PARENTERAL NUTRITION CONSULT NOTE - Follow Up  Pharmacy Consult for TNA Indication: Gastroparesis/ileus  No Known Allergies  Patient Measurements: Height: 5\' 11"  (180.3 cm) Weight: 199 lb 11.8 oz (90.6 kg) IBW/kg (Calculated) : 75.3   Vital Signs: Temp: 97.8 F (36.6 C) (07/31 1610) Temp src: Oral (07/31 0621) BP: 128/80 mmHg (07/31 0621) Pulse Rate: 92 (07/31 0621) Intake/Output from previous day: 07/30 0701 - 07/31 0700 In: 4351.7 [P.O.:960; I.V.:1981.7; IV Piggyback:800; TPN:600] Out: 5410 [Urine:5400; Drains:10] Intake/Output from this shift: Total I/O In: 740 [P.O.:740] Out: 5 [Drains:5]  Labs: No results found for this basename: WBC, HGB, HCT, PLT, APTT, INR,  in the last 72 hours   Recent Labs  07/30/12 0500 07/31/12 0550 08/01/12 0425  NA 138 139 138  K 3.8 4.0 4.1  CL 106 109 105  CO2 22 21 23   GLUCOSE 140* 157* 140*  BUN 11 9 10   CREATININE 0.94 0.90 0.85  CALCIUM 8.8 9.1 9.1  MG  --   --  2.1  PHOS  --   --  2.5  PROT  --   --  6.2  ALBUMIN  --   --  2.4*  AST  --   --  14  ALT  --   --  30  ALKPHOS  --   --  137*  BILITOT  --   --  0.3   Estimated Creatinine Clearance: 98.4 ml/min (by C-G formula based on Cr of 0.85).    Recent Labs  07/31/12 1828 07/31/12 2355 08/01/12 0619  GLUCAP 138* 159* 149*    Assessment:  67 YO M admitted 6/22 with abdominal pain s/p radical cystoprostatectomy for bladder cancer in Feb,2014. On 06/15/12 he required ureteral reimplantation into lleal conduit. Pelvic hematoma noted 6/22, IR aspiration and drain placed.  He had been unable to tolerate oral diet with persistent N/V, EGD done 7/1 revealed likely gastroparesis, abd films 7/2 consistent with ileus. NGT placed 7/2 with immediate brown-green drainage. Due to poor nutritional intake and ileus, TPN was started 7/2 PM and continued until 7/13 when pt appeared to be doing better with PO nutrition following successful decompression with rectal tube placement  (since d/c'd 7/17).  As of 7/24, pt still with refractory nausea without emesis and new CT 7/21 without sig ileus patter or clear obstruction. Repeat sigmoidoscopy 7/23 shows improvement per urology.  Patient has not been making good progress with PO nutrition and MD would like to resume TPN to meet 50% of nutritional goals while progressing.  TNA resumed 7/23 via PICC (placed 07/03/12).  Still with poor appetite, taking some liquids. Remeron started to help stimulate appetite. Megace started yesterday to help with appetite but pt refusing.  Still with few loose stools.  Nutritional Goals:  - RD re-estimated needs 7/25: 2080-2270 Kcal, 100-120g protein, 2.7 L fluid/day - Clinimix E 5/15 at a rate of 73ml/hr + 20% fat emulsion at 66ml/hr to provide: 48g/day protein, 1162Kcal/day = ~50% of nutritional goals  Current nutrition:  - Diet: regular diet + Ensure TID - TNA: Clinimix E 5/15 at 40 ml/hr + 20% fat emulsion at 10 ml/hr - mIVF:  D51/2NS + 20 mEq/L KCl @ 100 ml/hr  CBGs & Insulin requirements past 24 hours:  125-159, 9 units given  Labs: Electrolytes: K+ 4.1. Patient tolerating KCl 40 mEq PO TID and diarrhea resolved.  Other lytes WNL as well. Renal Function: stable, good UOP Hepatic Function: AST/ALT WNL, Alk Phos increased but trending down. Pre-Albumin: 16.6 (7/7),  11.1 (7/25), 14.0 (7/28) Triglycerides: WNL CBGs: one CBG above goal yesterday on moderate scale SSI.    Plan:    Continue Clinimix E 5/15 at goal rate of 40 ml/hr.  TNA to contain IV fat emulsion 20% at 10 ml/hr daily.  Multivitamin with minerals tablet PO daily.  Continue moderate scale SSI q6h.   Defer management of MIVF to MD.  Increased to 100 ml/hr by MD 7/24.  TNA labs Monday/Thursdays.  BMET in AM.  Pharmacy will follow up daily.    Clance Boll, PharmD, BCPS Pager: [phone] 08/01/2012 11:04 AM

## 2012-08-01 NOTE — Progress Notes (Signed)
[facility]  Patient Status:   Active pt with [facility] prior to this readmission.  [facility] is providing the following services: HH RN, HH PT and home infusion pharmacy for home IV abx.,   [facility] hospital coordinator team will follow Chad Olson during this admission and support DC to home if that is the final disposition.   If patient discharges after hours, please call [phone].   Sedalia Muta 08/01/2012, 10:17 AM

## 2012-08-01 NOTE — Progress Notes (Signed)
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 Healed well initially and sent home 6/17. Loopogram 6/27 without internal leak. Bilaterl Bander stents in place and to remain until GI issues resolved. Also with left nephrostomy tube that is capped, being left in place in case needed.  2 - Fever / h/o Bacteruria - Pt with MRSA in urine by hospital UCX 03/2012 and again subsequently in blood. Now on Vancomycin daily per ID x 4-6 week total (nearing end of course). Most recent UCX, BCX negative, New set pending from 6/22. Pt readmitted with low-grade fever 6/22 to 100.7 and malaise, no sig leukocytosis. Now on Vanc + Zosyn emirically. UCX with yeast (trated diflucan x 3 days), BCX no growth to date.Pelvic fluid aspirate and no growth / final.  ID now following,and DC'd all ABX given eval except for current C. Diff treatment which finished and now CDiff negative 7/18. Repeat fever spike 7/24 with new BCX (NGTD), UCX (klebsiella), Drain CX(low-growth psudomonas , C. Diff (negative), Now on Cipro as narrowest coverage based on CX and began Diflucan course for mild oral candidiasis.  3 - Heme / Recent DVT + PE -incidental PE by CT 03/2012 now on Lovenox, DVT resolved by most recent LE duplex. Repeat CT-PE 6/23 without PE or active bleed from pelvis and Lovenox DC'd. Hgb drift to 7 6/24 and transfused 2 u with response to 8, transfused 2u additional 6/25 with increase to 10. Hematology consult [name]) also agrees no treatment dose anticoagulation at this point, but resume proph dosing since he is not ambulating much.  4 - Pelvic Fluid Collection -  Pelvic fluid collection without rim-enhancement noted on CT from ER 6/22. Appears partially simple fluid with some dependent blood by HU analysis, no active extrav of blood or urine by imaging. IR aspiration performed 6/26 and fluid c/w hematoma only (not purulent, not simple/drainable). Repeat aspiration with insertion of 26F pigtail performed 7/14 at which time calculated volume using ellipsoid approximation . Hematoma drain has steadily put out dark hematoma fluid and repeat imaging 7/21 and 7/24 with calculated volume of hematoma .  5 - Hemorrhoids / Lower GI Bleed - Long h/o hemorroids. Pt with worsened / friable bleeding hemorroids on admission that are quite bothersome. No prior specific intervention, likely exacerbated by pelvic fluid collection / hematoma. Gen Surg recs conservative measures and now nearly resolved clinically.  6 - Nutrition / Nausea / C. Difficile- Pt with some element of baseline GI issues with GERD and prior small esophageal polyp. Has had exacerbation of nausea in house and not meeting nutritional goals. GI involved and initial EGD + KUB with ileus / gastroparesis picture. While ileus resolving placed on TPN and NGT for symptom relief and to bridge him with nutrition which was resumed 7/11 following successful decompression with rectal tube place with aide of sigmoidoscopy. C. Diff found positive 7/5 and started on dual therapy with flagyl + vanc enemas with plan for 14 day course which has ended. Rectal tube DC'd 7/17. Pt still with refractory nausea without emesis and new CT 7/21 without sig ileus patter or clear obstruction. Repeat sigmoidoscopy 7/23 with much improved rectal tissues and decreased extrinsic compression. TPN restarted 7/24 as not meeting caloric goals.  7 - Disposition -  PT eval has recommended DC to SNF when appropriate and family amenable. Family also interested in tertiary transfer, discussed with [facility] 7/27 Martin Majestic MD hospitalist transfer) and  unwilling to accept at this time but amenable to future transfer if failing to progress.  PMH sig for obesity and left knee replacement, PE + DVT (now resolved). No CV disease.   Today Soua continues to slowly make progress. He did not tolerate megace, but is working with PT more and certainly keeping more down PO.  Objective: Vital signs in last 24 hours: Temp:  [97.7 F (36.5 C)-98.4 F (36.9 C)] 97.7 F (36.5 C) (07/31 1356) Pulse Rate:  [80-105] 80 (07/31 1356) Resp:  [18-20] 18 (07/31 1356) BP: (112-128)/(76-90) 112/79 mmHg (07/31 1356) SpO2:  [99 %-100 %] 100 % (07/31 1356) Last BM Date: 08/01/12  Intake/Output from previous day: 07/30 0701 - 07/31 0700 In: 4351.7 [P.O.:960; I.V.:1981.7; IV Piggyback:800; TPN:600] Out: 5410 [Urine:5400; Drains:10] Intake/Output this shift: Total I/O In: 740 [P.O.:740] Out: 980 [Urine:975; Drains:5]  General appearance: alert, cooperative and appears stated age Head: Normocephalic, without obvious abnormality, atraumatic Eyes: conjunctivae/corneas clear. PERRL, EOM's intact. Fundi benign. Ears: normal TM's and external ear canals both ears Nose: Nares normal. Septum midline. Mucosa normal. No drainage or sinus tenderness. Throat: lips, mucosa, and tongue normal; teeth and gums normal Neck: no adenopathy, no carotid bruit, no JVD, supple, symmetrical, trachea midline and thyroid not enlarged, symmetric, no tenderness/mass/nodules Back: symmetric, no curvature. ROM normal. No CVA tenderness. Resp: clear to auscultation bilaterally Chest wall: no tenderness Cardio: regular rate and rhythm, S1, S2 normal, no murmur, click, rub or gallop GI: soft, non-tender; bowel sounds normal; no masses,  no organomegaly Male genitalia: normal Extremities: extremities normal, atraumatic, no cyanosis or edema and RUE PICC c/d/i. Pulses: 2+ and symmetric Skin: Skin color, texture, turgor normal. No rashes or lesions Lymph nodes: Cervical,  supraclavicular, and axillary nodes normal. Neurologic: Grossly normal Incision/Wound: RLQ Urostomy pink / patent with bander stents in place. Hematoma drain aspirated and percussed with scant return (3cc) dark bloody fluid. Left neph tube capped.  Lab Results:  No results found for this basename: WBC, HGB, HCT, PLT,  in the last 72 hours BMET  Recent Labs  07/31/12 0550 08/01/12 0425  NA 139 138  K 4.0 4.1  CL 109 105  CO2 21 23  GLUCOSE 157* 140*  BUN 9 10  CREATININE 0.90 0.85  CALCIUM 9.1 9.1   PT/INR No results found for this basename: LABPROT, INR,  in the last 72 hours ABG No results found for this basename: PHART, PCO2, PO2, HCO3,  in the last 72 hours  Studies/Results: No results found.  Anti-infectives: Anti-infectives   Start     Dose/Rate Route Frequency Ordered Stop   07/31/12 1800  fluconazole (DIFLUCAN) tablet 200 mg     200 mg Oral Every 24 hours 07/30/12 1911 08/10/12 1759   07/30/12 2015  fluconazole (DIFLUCAN) IVPB 800 mg    Comments:  First dose oral candidiasis, then transition to lower dose PO.   800 mg 200 mL/hr over 120 Minutes Intravenous  Once 07/30/12 1911 07/30/12 2255   07/29/12 1800  ciprofloxacin (CIPRO) IVPB 400 mg     400 mg 200 mL/hr over 60 Minutes Intravenous Every 12 hours 07/29/12 1706     07/25/12 2100  piperacillin-tazobactam (ZOSYN) IVPB 3.375 g  Status:  Discontinued     3.375 g 12.5 mL/hr over 240 Minutes Intravenous Every 8 hours 07/25/12 2047 07/29/12  1721   07/11/12 1200  vancomycin (VANCOCIN) 50 mg/mL oral solution 250 mg  Status:  Discontinued     250 mg Oral 4 times per day 07/11/12 1013 07/24/12 1159   07/06/12 1800  vancomycin (VANCOCIN) 500 mg in sodium chloride irrigation 0.9 % 100 mL ENEMA  Status:  Discontinued     500 mg Rectal 4 times per day 07/06/12 1335 07/11/12 1244   07/06/12 1430  metroNIDAZOLE (FLAGYL) IVPB 500 mg  Status:  Discontinued     500 mg 100 mL/hr over 60 Minutes Intravenous 3 times per day  07/06/12 1335 07/14/12 0725   06/25/12 1000  vancomycin (VANCOCIN) 1,250 mg in sodium chloride 0.9 % 250 mL IVPB  Status:  Discontinued     1,250 mg 166.7 mL/hr over 90 Minutes Intravenous Every 12 hours 06/25/12 0856 06/28/12 1102   06/24/12 2000  fluconazole (DIFLUCAN) IVPB 200 mg     200 mg 100 mL/hr over 60 Minutes Intravenous Every 24 hours 06/24/12 1733 06/26/12 2322   06/23/12 0900  vancomycin (VANCOCIN) 1,250 mg in sodium chloride 0.9 % 250 mL IVPB     1,250 mg 166.7 mL/hr over 90 Minutes Intravenous Every 12 hours 06/23/12 0522 06/24/12 2231   06/23/12 0600  piperacillin-tazobactam (ZOSYN) IVPB 3.375 g  Status:  Discontinued     3.375 g 12.5 mL/hr over 240 Minutes Intravenous 3 times per day 06/23/12 0447 06/28/12 1102   06/23/12 0330  cefTRIAXone (ROCEPHIN) 1 g in dextrose 5 % 50 mL IVPB     1 g 100 mL/hr over 30 Minutes Intravenous  Once 06/23/12 0319 06/23/12 0440      Assessment/Plan:  1 - Bladder Cancer / Cystectomy / Ureteral Revision- NED form cancer perspective.Loopogram without leak this admission. RX'd left neph tube removal under fluoro tomorrow to help avoid accidental dislodging left bander stent.   2 - Fever / h/o Bacteruria - Continue Cipro IV until tolerating PO further. Continue Diflucan for mild thrush.  3 - Heme / Recent DVT + PE -Lovenox now stopped. SCD's in place. PT following. Remain proph SQ heparin + SCD's.   4 - Pelvic Fluid Collection - Imaging and aspirate c/w non-infected hemaotma. Hematoma reimaging 7/24 with approx 60%+ volume reduction, would keep drain in place as long as putting out fluid to hasten resolution. Will re-image tomorrow, if no significant change in size will consider DC drain as output trending down.  5 - Hemorrhoids / Lower GI Bleed - Agree with conservative measures for now. Hgb stable.  6 - Nutrition / Nausea / C. Difficile- Slowly progressing. Did not tolerate megace, continue other supportive measures and diet  advancement.  7 - Disposition - May revisit role of transfer if current progress reverses.   [facility], Saharah Sherrow 08/01/2012

## 2012-08-02 ENCOUNTER — Inpatient Hospital Stay (HOSPITAL_COMMUNITY): Payer: BC Managed Care – PPO

## 2012-08-02 LAB — GLUCOSE, CAPILLARY: Glucose-Capillary: 158 mg/dL — ABNORMAL HIGH (ref 70–99)

## 2012-08-02 LAB — BASIC METABOLIC PANEL
BUN: 10 mg/dL (ref 6–23)
CO2: 21 mEq/L (ref 19–32)
Calcium: 9.4 mg/dL (ref 8.4–10.5)
Chloride: 106 mEq/L (ref 96–112)
Creatinine, Ser: 0.99 mg/dL (ref 0.50–1.35)
GFR calc Af Amer: >90 mL/min (ref >90)

## 2012-08-02 MED ORDER — FAT EMULSION 20 % IV EMUL
240.0000 mL | INTRAVENOUS | Status: AC
  Administered 2012-08-02: 240 mL via INTRAVENOUS
  Filled 2012-08-02: qty 250

## 2012-08-02 MED ORDER — INSULIN REGULAR HUMAN 100 UNIT/ML IJ SOLN
INTRAMUSCULAR | Status: AC
  Administered 2012-08-02: 18:00:00 via INTRAVENOUS
  Filled 2012-08-02: qty 1000

## 2012-08-02 MED ORDER — IOHEXOL 300 MG/ML  SOLN
10.0000 mL | Freq: Once | INTRAMUSCULAR | Status: AC | PRN
  Administered 2012-08-02: 10 mL

## 2012-08-02 NOTE — Procedures (Signed)
Successful LT PCN REMOVAL UNDER FLUORO  NO COMP STABLE

## 2012-08-02 NOTE — Progress Notes (Signed)
CSW continues to follow and offer emotional support.  Thersa Mohiuddin C. Jeris Roser MSW, LCSW [phone]

## 2012-08-02 NOTE — Progress Notes (Signed)
PARENTERAL NUTRITION CONSULT NOTE - Follow Up  Pharmacy Consult for TNA Indication: Gastroparesis/ileus  No Known Allergies  Patient Measurements: Height: 5\' 11"  (180.3 cm) Weight: 199 lb 11.8 oz (90.6 kg) IBW/kg (Calculated) : 75.3   Vital Signs: Temp: 97.5 F (36.4 C) (08/01 0604) Temp src: Oral (08/01 0604) BP: 116/84 mmHg (08/01 0604) Pulse Rate: 91 (08/01 0617) Intake/Output from previous day: 07/31 0701 - 08/01 0700 In: 4156.7 [P.O.:740; I.V.:2411.7; IV Piggyback:400; TPN:600] Out: 4660 [Urine:4650; Drains:10] Intake/Output from this shift:    Labs: No results found for this basename: WBC, HGB, HCT, PLT, APTT, INR,  in the last 72 hours   Recent Labs  07/31/12 0550 08/01/12 0425 08/02/12 0345  NA 139 138 137  K 4.0 4.1 4.3  CL 109 105 106  CO2 21 23 21   GLUCOSE 157* 140* 153*  BUN 9 10 10   CREATININE 0.90 0.85 0.99  CALCIUM 9.1 9.1 9.4  MG  --  2.1  --   PHOS  --  2.5  --   PROT  --  6.2  --   ALBUMIN  --  2.4*  --   AST  --  14  --   ALT  --  30  --   ALKPHOS  --  137*  --   BILITOT  --  0.3  --    Estimated Creatinine Clearance: 84.5 ml/min (by C-G formula based on Cr of 0.99).    Recent Labs  08/01/12 1751 08/01/12 2343 08/02/12 0606  GLUCAP 181* 159* 158*    Assessment:  67 YO M admitted 6/22 with abdominal pain s/p radical cystoprostatectomy for bladder cancer in Feb,2014. On 06/15/12 he required ureteral reimplantation into lleal conduit. Pelvic hematoma noted 6/22, IR aspiration and drain placed.  He had been unable to tolerate oral diet with persistent N/V, EGD done 7/1 revealed likely gastroparesis, abd films 7/2 consistent with ileus. NGT placed 7/2 with immediate brown-green drainage. Due to poor nutritional intake and ileus, TPN was started 7/2 PM and continued until 7/13 when pt appeared to be doing better with PO nutrition following successful decompression with rectal tube placement (since d/c'd 7/17).  As of 7/24, pt still with  refractory nausea without emesis and new CT 7/21 without sig ileus patter or clear obstruction. Repeat sigmoidoscopy 7/23 shows improvement per urology.  Patient has not been making good progress with PO nutrition and MD would like to resume TPN to meet 50% of nutritional goals while progressing.  TNA resumed 7/23 via PICC (placed 07/03/12).  Taking some liquids. Remeron started to help stimulate appetite. Pt refusing Megace.    Nutritional Goals:  - RD re-estimated needs 7/25: 2080-2270 Kcal, 100-120g protein, 2.7 L fluid/day - Clinimix E 5/15 at a rate of 26ml/hr + 20% fat emulsion at 34ml/hr to provide: 48g/day protein, 1162Kcal/day = ~50% of nutritional goals  Current nutrition:  - Diet: regular diet + Ensure TID - TNA: Clinimix E 5/15 at 40 ml/hr + 20% fat emulsion at 10 ml/hr - mIVF:  D51/2NS + 20 mEq/L KCl @ 100 ml/hr  CBGs & Insulin requirements past 24 hours:  133-181, 11 units given  Labs: Electrolytes: K+ 4.3. Patient tolerating KCl 40 mEq PO TID.  Other lytes WNL as well.  May consider reducing KCl to BID if K+ continues to trend up and diarrhea resolved.  F/u K+ in AM.   Renal Function: stable, good UOP Hepatic Function: AST/ALT WNL, Alk Phos increased but trending down. Pre-Albumin: 16.6 (7/7), 11.1 (  7/25), 14.0 (7/28) Triglycerides: WNL CBGs: CBGs mostly above goal and not controlled with moderate scale SSI, will add 10 units regular insulin to TNA    Plan:    Continue Clinimix E 5/15 at goal rate of 40 ml/hr.    Add 10 units regular insulin to TNA.  TNA to contain IV fat emulsion 20% at 10 ml/hr daily.  Multivitamin with minerals tablet PO daily.  Continue moderate scale SSI q6h.   Defer management of MIVF to MD.  Increased to 100 ml/hr by MD 7/24.  TNA labs Monday/Thursdays.  BMET in AM.  Pharmacy will follow up daily.     Clance Boll, PharmD, BCPS Pager: [phone] 08/02/2012 11:39 AM

## 2012-08-02 NOTE — Progress Notes (Signed)
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 Healed well initially and sent home 6/17. Loopogram 6/27 without internal leak. Bilaterl Bander stents in place and to remain until GI issues resolved. 8/1 removed left neph tube under fluoro.   2 - Fever / h/o Bacteruria - Pt with MRSA in urine by hospital UCX 03/2012 and again subsequently in blood. Now on Vancomycin daily per ID x 4-6 week total (nearing end of course). Most recent UCX, BCX negative, New set pending from 6/22. Pt readmitted with low-grade fever 6/22 to 100.7 and malaise, no sig leukocytosis. Now on Vanc + Zosyn emirically. UCX with yeast (trated diflucan x 3 days), BCX no growth to date.Pelvic fluid aspirate and no growth / final.  ID now following,and DC'd all ABX given eval except for current C. Diff treatment which finished and now CDiff negative 7/18. Repeat fever spike 7/24 with new BCX (NGTD), UCX (klebsiella), Drain CX(low-growth psudomonas , C. Diff (negative), Now on Cipro as narrowest coverage based on CX and began Diflucan course for mild oral candidiasis.  3 - Heme / Recent DVT + PE -incidental PE by CT 03/2012 now on Lovenox, DVT resolved by most recent LE duplex. Repeat CT-PE 6/23 without PE or active bleed from pelvis and Lovenox DC'd. Hgb drift to 7 6/24 and transfused 2 u with response to 8, transfused 2u additional 6/25 with increase to 10. Hematology consult [name]) also agrees no treatment dose anticoagulation at this point, but resume proph dosing since he is not ambulating much.  4 - Pelvic Fluid Collection - Pelvic fluid collection without  rim-enhancement noted on CT from ER 6/22. Appears partially simple fluid with some dependent blood by HU analysis, no active extrav of blood or urine by imaging. IR aspiration performed 6/26 and fluid c/w hematoma only (not purulent, not simple/drainable). Repeat aspiration with insertion of 67F pigtail performed 7/14 at which time calculated volume using ellipsoid approximation . Hematoma drain has steadily put out dark hematoma fluid and repeat imaging 7/21 and 7/24 with calculated volume of hematoma . CT 8/1 with minimal interval change therefore hematoma drain removed.   5 - Hemorrhoids / Lower GI Bleed - Long h/o hemorroids. Pt with worsened / friable bleeding hemorroids on admission that are quite bothersome. No prior specific intervention, likely exacerbated by pelvic fluid collection / hematoma. Gen Surg recs conservative measures and now nearly resolved clinically.  6 - Nutrition / Nausea / C. Difficile- Pt with some element of baseline GI issues with GERD and prior small esophageal polyp. Has had exacerbation of nausea in house and not meeting nutritional goals. GI involved and initial EGD + KUB with ileus / gastroparesis picture. While ileus resolving placed on TPN and NGT for symptom relief and to bridge him with nutrition which was resumed 7/11 following successful decompression with rectal tube place with aide of sigmoidoscopy. C. Diff found positive 7/5 and started on dual therapy with flagyl + vanc enemas with plan for 14 day course which has ended. Rectal tube DC'd 7/17. Pt still with refractory nausea without emesis and new CT 7/21 without sig ileus patter or clear obstruction. Repeat sigmoidoscopy 7/23 with much improved rectal tissues and decreased extrinsic compression. TPN restarted 7/24 as not meeting caloric goals.  7 - Disposition - PT eval has recommended DC to SNF when appropriate and family amenable. Family also interested in tertiary transfer, discussed with [facility] 7/27  Martin Majestic MD hospitalist transfer) and unwilling to accept at this time but amenable to future transfer if failing to progress.  PMH sig for obesity and left knee replacement, PE + DVT (now resolved). No CV disease.   Today Chad Olson remains tolerant of increasing PO intake a/w emesis on over 1 week. Also increasing activity.   Objective: Vital signs in last 24 hours: Temp:  [97.5 F (36.4 C)-98.5 F (36.9 C)] 98.1 F (36.7 C) (08/01 1300) Pulse Rate:  [60-91] 87 (08/01 1300) Resp:  [16-18] 16 (08/01 1300) BP: (93-123)/(73-84) 93/73 mmHg (08/01 1300) SpO2:  [97 %-100 %] 100 % (08/01 1300) Last BM Date: 08/01/12  Intake/Output from previous day: 07/31 0701 - 08/01 0700 In: 4156.7 [P.O.:740; I.V.:2411.7; IV Piggyback:400; TPN:600] Out: 4660 [Urine:4650; Drains:10] Intake/Output this shift:    General appearance: alert, cooperative, appears stated age and family not present today Head: Normocephalic, without obvious abnormality, atraumatic Eyes: conjunctivae/corneas clear. PERRL, EOM's intact. Fundi benign. Ears: normal TM's and external ear canals both ears Nose: Nares normal. Septum midline. Mucosa normal. No drainage or sinus tenderness. Throat: lips, mucosa, and tongue normal; teeth and gums normal Neck: no adenopathy, no carotid bruit, no JVD, supple, symmetrical, trachea midline and thyroid not enlarged, symmetric, no tenderness/mass/nodules Back: symmetric, no curvature. ROM normal. No CVA tenderness. Resp: clear to auscultation bilaterally Cardio: regular rate and rhythm, S1, S2 normal, no murmur, click, rub or gallop GI: soft, non-tender; bowel sounds normal; no masses,  no organomegaly Male genitalia: normal Extremities: extremities normal, atraumatic, no cyanosis or edema and RUE PICC c/d/i. Pulses: 2+ and symmetric Skin: Skin color, texture, turgor normal. No rashes or lesions Lymph nodes: Cervical, supraclavicular, and axillary nodes normal. Neurologic: Grossly  normal Incision/Wound: RLQ Urostomy pink / patent with bander stents in place. Removed hematoma drain at bedside and dry dressing applied.  Lab Results:  No results found for this basename: WBC, HGB, HCT, PLT,  in the last 72 hours BMET  Recent Labs  08/01/12 0425 08/02/12 0345  NA 138 137  K 4.1 4.3  CL 105 106  CO2 23 21  GLUCOSE 140* 153*  BUN 10 10  CREATININE 0.85 0.99  CALCIUM 9.1 9.4   PT/INR No results found for this basename: LABPROT, INR,  in the last 72 hours ABG No results found for this basename: PHART, PCO2, PO2, HCO3,  in the last 72 hours  Studies/Results: Ct Pelvis Wo Contrast  08/02/2012   *RADIOLOGY REPORT*  Clinical Data: Evaluate pelvic hematoma and drain placement.  67- year-old male with history of bladder cancer status post cystoprostatectomy, ureteral revision and lymph node dissections.  CT PELVIS WITHOUT CONTRAST  Technique:  Multidetector CT imaging of the pelvis was performed following the standard protocol without intravenous contrast.  Comparison: Prior CT scan of the abdomen and pelvis 07/26/2012  Findings: Slightly decreased size of the large intermediate attenuation fluid collection in the pelvic retroperitoneal/presacral space which currently measures 10.9 x 7.6 by 6.3 cm compared to 12.1 x 7.7 by 6.3 cm.  The left gluteal approach percutaneous drainage catheter remains within the periphery of the fluid collection.  It has pulled back slightly compared to the initial placement.  Incompletely imaged bilateral externalized ureteral stents are seen entering the right lower quadrant diverting ileostomy and extending into the visualized ureters.  No new lung hematoma, fluid  collection or abscess identified. No acute osseous abnormality.  No new lymphadenopathy.  Atherosclerotic calcifications noted in the distal aorta.  IMPRESSION:  1.  Slight interval decrease in size of the presacral pelvic hematoma which currently measures 10.9 x 7.6 x 6.3 cm compared to  12.1 x 7.7 x 6.3 cm.  The percutaneous drain remains within the periphery of the fluid collection although it has pulled back slightly.  2.  No new hematoma, abscess or fluid collection identified.   Original Report Authenticated By: Malachy Moan, M.D.   Ir Nephro Tube Remov/fl  08/02/2012   *RADIOLOGY REPORT*  Clinical Data: Ureteral stents, ileal conduit, left nephrostomy ready for removal  NEPHROSTOMY REMOVAL  Comparison: 08/02/2012 CT  Findings: Informed consent was obtained.  Time out performed.  Under sterile conditions, the existing left nephrostomy tube was injected and confirmed to be in the renal pelvis.  Catheter was cut and removed over a guidewire without difficulty.  No immediate complication.  Images obtained for documentation.  Stable ureteral stent position.  IMPRESSION: Uncomplicated removal of the left nephrostomy.   Original Report Authenticated By: Judie Petit. Miles Costain, M.D.    Anti-infectives: Anti-infectives   Start     Dose/Rate Route Frequency Ordered Stop   07/31/12 1800  fluconazole (DIFLUCAN) tablet 200 mg     200 mg Oral Every 24 hours 07/30/12 1911 08/10/12 1759   07/30/12 2015  fluconazole (DIFLUCAN) IVPB 800 mg    Comments:  First dose oral candidiasis, then transition to lower dose PO.   800 mg 200 mL/hr over 120 Minutes Intravenous  Once 07/30/12 1911 07/30/12 2255   07/29/12 1800  ciprofloxacin (CIPRO) IVPB 400 mg     400 mg 200 mL/hr over 60 Minutes Intravenous Every 12 hours 07/29/12 1706     07/25/12 2100  piperacillin-tazobactam (ZOSYN) IVPB 3.375 g  Status:  Discontinued     3.375 g 12.5 mL/hr over 240 Minutes Intravenous Every 8 hours 07/25/12 2047 07/29/12 1721   07/11/12 1200  vancomycin (VANCOCIN) 50 mg/mL oral solution 250 mg  Status:  Discontinued     250 mg Oral 4 times per day 07/11/12 1013 07/24/12 1159   07/06/12 1800  vancomycin (VANCOCIN) 500 mg in sodium chloride irrigation 0.9 % 100 mL ENEMA  Status:  Discontinued     500 mg Rectal 4 times per day  07/06/12 1335 07/11/12 1244   07/06/12 1430  metroNIDAZOLE (FLAGYL) IVPB 500 mg  Status:  Discontinued     500 mg 100 mL/hr over 60 Minutes Intravenous 3 times per day 07/06/12 1335 07/14/12 0725   06/25/12 1000  vancomycin (VANCOCIN) 1,250 mg in sodium chloride 0.9 % 250 mL IVPB  Status:  Discontinued     1,250 mg 166.7 mL/hr over 90 Minutes Intravenous Every 12 hours 06/25/12 0856 06/28/12 1102   06/24/12 2000  fluconazole (DIFLUCAN) IVPB 200 mg     200 mg 100 mL/hr over 60 Minutes Intravenous Every 24 hours 06/24/12 1733 06/26/12 2322   06/23/12 0900  vancomycin (VANCOCIN) 1,250 mg in sodium chloride 0.9 % 250 mL IVPB     1,250 mg 166.7 mL/hr over 90 Minutes Intravenous Every 12 hours 06/23/12 0522 06/24/12 2231   06/23/12 0600  piperacillin-tazobactam (ZOSYN) IVPB 3.375 g  Status:  Discontinued     3.375 g 12.5 mL/hr over 240 Minutes Intravenous 3 times per day 06/23/12 0447 06/28/12 1102   06/23/12 0330  cefTRIAXone (ROCEPHIN) 1 g in dextrose 5 % 50 mL IVPB  1 g 100 mL/hr over 30 Minutes Intravenous  Once 06/23/12 0319 06/23/12 0440      Assessment/Plan: 1 - Bladder Cancer / Cystectomy / Ureteral Revision- NED form cancer perspective.Loopogram without leak this admission. Will likely remove bander stents in house after meeting more nutritional goals.   2 - Fever / h/o Bacteruria - Continue Cipro IV until tolerating PO further. Continue Diflucan for mild thrush.  3 - Heme / Recent DVT + PE -Lovenox now stopped. SCD's in place. PT following. Remain proph SQ heparin + SCD's.   4 - Pelvic Fluid Collection - Hematoma with approx 70% decreased volume with drain and drain removed today.   5 - Hemorrhoids / Lower GI Bleed - Agree with conservative measures for now. Hgb stable.  6 - Nutrition / Nausea / C. Difficile- Slowly progressing. Did not tolerate megace, continue other supportive measures and diet advancement.  7 - Disposition - May revisit role of transfer if current  progress reverses.   [facility], Avyay Coger 08/02/2012

## 2012-08-02 NOTE — Progress Notes (Signed)
Physical Therapy Treatment Patient Details Name: Chad Olson MRN: [id number] DOB: [birth year] Today's Date: 08/02/2012 Time: 0865-7846 PT Time Calculation (min): 14 min  PT Assessment / Plan / Recommendation  History of Present Illness     PT Comments   Pt OOB in BR with spouse assisting.  Amb in hallway w/o RW per spouse request as she felt having pt hold IV pole would be less inhibiting and help increase his posture when in fact pat fatigued quicker and amb less distance.  Assisted pt back to bed.  VERY supportive family assisting pt with mobility/toileting.   Follow Up Recommendations  SNF     Does the patient have the potential to tolerate intense rehabilitation     Barriers to Discharge        Equipment Recommendations       Recommendations for Other Services    Frequency Min 3X/week   Progress towards PT Goals Progress towards PT goals: Progressing toward goals  Plan      Precautions / Restrictions Precautions Precautions: Fall    Pertinent Vitals/Pain C/o fatigue with frex trips to BR due to loose stools stated pt.    Mobility  Bed Mobility Bed Mobility: Sit to Supine Sit to Supine: 4: Min assist;4: Min guard Details for Bed Mobility Assistance: increased time and increased assist back to bed due to fatigue Transfers Transfers: Sit to Stand;Stand to Sit Sit to Stand: 4: Min guard;4: Min assist;From toilet;From chair/3-in-1 Stand to Sit: 4: Min guard;4: Min assist;To bed;To toilet Details for Transfer Assistance: increased time with <25% VC's on proper hand placement for increased safety Ambulation/Gait Ambulation/Gait Assistance: 4: Min guard;4: Min assist Ambulation Distance (Feet): 75 Feet Assistive device: None Ambulation/Gait Assistance Details: Spouse wanted to amb pt w/o AD thinking that would help upright his posture and inhibit him.  Pt demon increased instability and decreased amb distance not using RW. Complied with spouse wishes.  Gait Pattern:  Step-through pattern;Decreased stride length;Wide base of support;Trunk flexed Gait velocity: decreased     PT Goals (current goals can now be found in the care plan section)    Visit Information  Last PT Received On: 08/02/12 Assistance Needed: +2 (safety + equipment)    Subjective Data      Cognition       Balance     End of Session PT - End of Session Equipment Utilized During Treatment: Gait belt Activity Tolerance: Patient limited by fatigue   [name]  PTA [facility]  Acute  Rehab Pager      [phone]

## 2012-08-02 NOTE — Progress Notes (Signed)
9 Days Post-Op  Subjective: Pt feeling about the same, though seems in better spirits. TNA on nocturnal schedule. Had repeat CT this am.  Objective: Vital signs in last 24 hours: Temp:  [97.5 F (36.4 C)-98.5 F (36.9 C)] 97.5 F (36.4 C) (08/01 0604) Pulse Rate:  [60-91] 91 (08/01 0617) Resp:  [18] 18 (08/01 0604) BP: (112-125)/(67-84) 116/84 mmHg (08/01 0604) SpO2:  [97 %-100 %] 100 % (08/01 0604) Last BM Date: 08/01/12  TG/pelvic drain intact, output remains dark bloody, averaging ~10cc/day  Lab Results:  No results found for this basename: WBC, HGB, HCT, PLT,  in the last 72 hours BMET  Recent Labs  08/01/12 0425 08/02/12 0345  NA 138 137  K 4.1 4.3  CL 105 106  CO2 23 21  GLUCOSE 140* 153*  BUN 10 10  CREATININE 0.85 0.99  CALCIUM 9.1 9.4   PT/INR No results found for this basename: LABPROT, INR,  in the last 72 hours ABG No results found for this basename: PHART, PCO2, PO2, HCO3,  in the last 72 hours  Studies/Results: Ct Pelvis Wo Contrast  08/02/2012   *RADIOLOGY REPORT*  Clinical Data: Evaluate pelvic hematoma and drain placement.  31- year-old male with history of bladder cancer status post cystoprostatectomy, ureteral revision and lymph node dissections.  CT PELVIS WITHOUT CONTRAST  Technique:  Multidetector CT imaging of the pelvis was performed following the standard protocol without intravenous contrast.  Comparison: Prior CT scan of the abdomen and pelvis 07/26/2012  Findings: Slightly decreased size of the large intermediate attenuation fluid collection in the pelvic retroperitoneal/presacral space which currently measures 10.9 x 7.6 by 6.3 cm compared to 12.1 x 7.7 by 6.3 cm.  The left gluteal approach percutaneous drainage catheter remains within the periphery of the fluid collection.  It has pulled back slightly compared to the initial placement.  Incompletely imaged bilateral externalized ureteral stents are seen entering the right lower quadrant  diverting ileostomy and extending into the visualized ureters.  No new lung hematoma, fluid collection or abscess identified. No acute osseous abnormality.  No new lymphadenopathy.  Atherosclerotic calcifications noted in the distal aorta.  IMPRESSION:  1.  Slight interval decrease in size of the presacral pelvic hematoma which currently measures 10.9 x 7.6 x 6.3 cm compared to 12.1 x 7.7 x 6.3 cm.  The percutaneous drain remains within the periphery of the fluid collection although it has pulled back slightly.  2.  No new hematoma, abscess or fluid collection identified.   Original Report Authenticated By: Malachy Moan, M.D.   Results for orders placed during the hospital encounter of 06/22/12  CULTURE, BLOOD (ROUTINE X 2)     Status: None   Collection Time    06/22/12 10:43 PM      Result Value Range Status   Specimen Description BLOOD LEFT ANTECUBITAL   Final   Special Requests BOTTLES DRAWN AEROBIC AND ANAEROBIC 5CC   Final   Culture  Setup Time 06/23/2012 17:47   Final   Culture NO GROWTH 5 DAYS   Final   Report Status 06/29/2012 FINAL   Final  CULTURE, BLOOD (ROUTINE X 2)     Status: None   Collection Time    06/22/12 10:57 PM      Result Value Range Status   Specimen Description BLOOD LEFT ARM   Final   Special Requests BOTTLES DRAWN AEROBIC AND ANAEROBIC 10CC   Final   Culture  Setup Time 06/23/2012 17:47   Final  Culture NO GROWTH 5 DAYS   Final   Report Status 06/29/2012 FINAL   Final  URINE CULTURE     Status: None   Collection Time    06/23/12  2:17 AM      Result Value Range Status   Specimen Description URINE, CATHETERIZED   Final   Special Requests Normal   Final   Culture  Setup Time 06/23/2012 15:21   Final   Colony Count 35,000 COLONIES/ML   Final   Culture YEAST   Final   Report Status 06/24/2012 FINAL   Final  MRSA PCR SCREENING     Status: None   Collection Time    06/24/12  9:58 AM      Result Value Range Status   MRSA by PCR NEGATIVE  NEGATIVE Final    Comment:            The GeneXpert MRSA Assay (FDA     approved for NASAL specimens     only), is one component of a     comprehensive MRSA colonization     surveillance program. It is not     intended to diagnose MRSA     infection nor to guide or     monitor treatment for     MRSA infections.  CULTURE, ROUTINE-ABSCESS     Status: None   Collection Time    06/27/12 12:51 PM      Result Value Range Status   Specimen Description ABSCESS PELVIC   Final   Special Requests NONE   Final   Gram Stain     Final   Value: MODERATE WBC PRESENT, PREDOMINANTLY PMN     NO SQUAMOUS EPITHELIAL CELLS SEEN     NO ORGANISMS SEEN   Culture NO GROWTH 3 DAYS   Final   Report Status 06/30/2012 FINAL   Final  CLOSTRIDIUM DIFFICILE BY PCR     Status: Abnormal   Collection Time    07/06/12 10:05 AM      Result Value Range Status   C difficile by pcr POSITIVE (*) NEGATIVE Final   Comment: CRITICAL RESULT CALLED TO, READ BACK BY AND VERIFIED WITH:     E.HARLESS,RN 1324 07/06/12 EHOWARD  CULTURE, ROUTINE-ABSCESS     Status: None   Collection Time    07/15/12  4:55 PM      Result Value Range Status   Specimen Description PERITONEAL CAVITY   Final   Special Requests NONE   Final   Gram Stain     Final   Value: NO WBC SEEN     NO SQUAMOUS EPITHELIAL CELLS SEEN     NO ORGANISMS SEEN   Culture NO GROWTH 3 DAYS   Final   Report Status 07/18/2012 FINAL   Final  BODY FLUID CULTURE     Status: None   Collection Time    07/18/12  3:13 PM      Result Value Range Status   Specimen Description OTHER PELVIC   Final   Special Requests Normal   Final   Gram Stain     Final   Value: RARE WBC PRESENT, PREDOMINANTLY PMN     NO ORGANISMS SEEN   Culture NO GROWTH 3 DAYS   Final   Report Status 07/21/2012 FINAL   Final  CLOSTRIDIUM DIFFICILE BY PCR     Status: None   Collection Time    07/19/12  9:31 AM      Result Value Range Status   C  difficile by pcr NEGATIVE  NEGATIVE Final  URINE CULTURE     Status: None    Collection Time    07/25/12  4:45 PM      Result Value Range Status   Specimen Description URINE, CLEAN CATCH   Final   Special Requests NONE   Final   Culture  Setup Time 07/26/2012 00:20   Final   Colony Count >=100,000 COLONIES/ML   Final   Culture KLEBSIELLA PNEUMONIAE   Final   Report Status 07/27/2012 FINAL   Final   Organism ID, Bacteria KLEBSIELLA PNEUMONIAE   Final  BODY FLUID CULTURE     Status: None   Collection Time    07/25/12  4:57 PM      Result Value Range Status   Specimen Description OTHER PELVIC DRAINAGE   Final   Special Requests Normal   Final   Gram Stain     Final   Value: NO WBC SEEN     ABUNDANT GRAM NEGATIVE RODS     Gram Stain Report Called to,Read Back By and Verified With: Gram Stain Report Called to,Read Back By and Verified With:  CAROL M @0713  ON 07/26/12 BY SMIAS   Culture ABUNDANT PSEUDOMONAS AERUGINOSA   Final   Report Status 07/29/2012 FINAL   Final   Organism ID, Bacteria PSEUDOMONAS AERUGINOSA   Final  CULTURE, BLOOD (ROUTINE X 2)     Status: None   Collection Time    07/25/12  5:10 PM      Result Value Range Status   Specimen Description BLOOD LEFT ARM   Final   Special Requests BOTTLES DRAWN AEROBIC ONLY 1CC   Final   Culture  Setup Time 07/26/2012 01:20   Final   Culture NO GROWTH 5 DAYS   Final   Report Status 08/01/2012 FINAL   Final  CULTURE, BLOOD (ROUTINE X 2)     Status: None   Collection Time    07/25/12  6:20 PM      Result Value Range Status   Specimen Description BLOOD LEFT ARM   Final   Special Requests BOTTLES DRAWN AEROBIC AND ANAEROBIC 10CC   Final   Culture  Setup Time 07/25/2012 22:48   Final   Culture NO GROWTH 5 DAYS   Final   Report Status 07/31/2012 FINAL   Final  CLOSTRIDIUM DIFFICILE BY PCR     Status: None   Collection Time    07/26/12 12:05 PM      Result Value Range Status   C difficile by pcr NEGATIVE  NEGATIVE Final    Anti-infectives: Anti-infectives   Start     Dose/Rate Route Frequency Ordered  Stop   07/31/12 1800  fluconazole (DIFLUCAN) tablet 200 mg     200 mg Oral Every 24 hours 07/30/12 1911 08/10/12 1759   07/30/12 2015  fluconazole (DIFLUCAN) IVPB 800 mg    Comments:  First dose oral candidiasis, then transition to lower dose PO.   800 mg 200 mL/hr over 120 Minutes Intravenous  Once 07/30/12 1911 07/30/12 2255   07/29/12 1800  ciprofloxacin (CIPRO) IVPB 400 mg     400 mg 200 mL/hr over 60 Minutes Intravenous Every 12 hours 07/29/12 1706     07/25/12 2100  piperacillin-tazobactam (ZOSYN) IVPB 3.375 g  Status:  Discontinued     3.375 g 12.5 mL/hr over 240 Minutes Intravenous Every 8 hours 07/25/12 2047 07/29/12 1721   07/11/12 1200  vancomycin (VANCOCIN) 50 mg/mL oral solution 250 mg  Status:  Discontinued     250 mg Oral 4 times per day 07/11/12 1013 07/24/12 1159   07/06/12 1800  vancomycin (VANCOCIN) 500 mg in sodium chloride irrigation 0.9 % 100 mL ENEMA  Status:  Discontinued     500 mg Rectal 4 times per day 07/06/12 1335 07/11/12 1244   07/06/12 1430  metroNIDAZOLE (FLAGYL) IVPB 500 mg  Status:  Discontinued     500 mg 100 mL/hr over 60 Minutes Intravenous 3 times per day 07/06/12 1335 07/14/12 0725   06/25/12 1000  vancomycin (VANCOCIN) 1,250 mg in sodium chloride 0.9 % 250 mL IVPB  Status:  Discontinued     1,250 mg 166.7 mL/hr over 90 Minutes Intravenous Every 12 hours 06/25/12 0856 06/28/12 1102   06/24/12 2000  fluconazole (DIFLUCAN) IVPB 200 mg     200 mg 100 mL/hr over 60 Minutes Intravenous Every 24 hours 06/24/12 1733 06/26/12 2322   06/23/12 0900  vancomycin (VANCOCIN) 1,250 mg in sodium chloride 0.9 % 250 mL IVPB     1,250 mg 166.7 mL/hr over 90 Minutes Intravenous Every 12 hours 06/23/12 0522 06/24/12 2231   06/23/12 0600  piperacillin-tazobactam (ZOSYN) IVPB 3.375 g  Status:  Discontinued     3.375 g 12.5 mL/hr over 240 Minutes Intravenous 3 times per day 06/23/12 0447 06/28/12 1102   06/23/12 0330  cefTRIAXone (ROCEPHIN) 1 g in dextrose 5 % 50 mL  IVPB     1 g 100 mL/hr over 30 Minutes Intravenous  Once 06/23/12 0319 06/23/12 0440      Assessment/Plan: s/p pelvic hematoma drainage 7/14  f/u CT 8/1 with some interval decrease in size. Drain in appropriate position D/W Dr. Miles Costain, leave drain in for now as he continues to have output and still residual hematoma on CT Also for (L)PCN removal today, see associated procedure note.   LOS: 41 days    Brayton El 08/02/2012

## 2012-08-03 LAB — BASIC METABOLIC PANEL
BUN: 8 mg/dL (ref 6–23)
CO2: 20 mEq/L (ref 19–32)
Calcium: 9.3 mg/dL (ref 8.4–10.5)
Chloride: 104 mEq/L (ref 96–112)
Creatinine, Ser: 0.87 mg/dL (ref 0.50–1.35)

## 2012-08-03 LAB — GLUCOSE, CAPILLARY
Glucose-Capillary: 173 mg/dL — ABNORMAL HIGH (ref 70–99)
Glucose-Capillary: 146 mg/dL — ABNORMAL HIGH (ref 70–99)

## 2012-08-03 MED ORDER — INSULIN REGULAR HUMAN 100 UNIT/ML IJ SOLN
INTRAMUSCULAR | Status: AC
  Administered 2012-08-03: 17:00:00 via INTRAVENOUS
  Filled 2012-08-03: qty 1000

## 2012-08-03 MED ORDER — FAT EMULSION 20 % IV EMUL
240.0000 mL | INTRAVENOUS | Status: AC
  Administered 2012-08-03: 240 mL via INTRAVENOUS
  Filled 2012-08-03: qty 250

## 2012-08-03 NOTE — Progress Notes (Signed)
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 Healed well initially and sent home 6/17. Loopogram 6/27 without internal leak. Bilaterl Bander stents in place and to remain until GI issues resolved. 8/1 removed left neph tube under fluoro.  2 - Fever / h/o Bacteruria - Pt with MRSA in urine by hospital UCX 03/2012 and again subsequently in blood. Now on Vancomycin daily per ID x 4-6 week total (nearing end of course). Most recent UCX, BCX negative, New set pending from 6/22. Pt readmitted with low-grade fever 6/22 to 100.7 and malaise, no sig leukocytosis. Now on Vanc + Zosyn emirically. UCX with yeast (trated diflucan x 3 days), BCX no growth to date.Pelvic fluid aspirate and no growth / final. ID now following,and DC'd all ABX given eval except for current C. Diff treatment which finished and now CDiff negative 7/18. Repeat fever spike 7/24 with new BCX (NGTD), UCX (klebsiella), Drain CX(low-growth psudomonas , C. Diff (negative), Now on Cipro as narrowest coverage based on CX and began Diflucan course for mild oral candidiasis.  3 - Heme / Recent DVT + PE -incidental PE by CT 03/2012 now on Lovenox, DVT resolved by most recent LE duplex. Repeat CT-PE 6/23 without PE or active bleed from pelvis and Lovenox DC'd. Hgb drift to 7 6/24 and transfused 2 u with response to 8, transfused 2u additional 6/25 with increase to 10. Hematology consult [name]) also agrees no treatment dose anticoagulation at this point, but resume proph dosing since he is not ambulating much.  4 - Pelvic Fluid Collection - Pelvic fluid collection without  rim-enhancement noted on CT from ER 6/22. Appears partially simple fluid with some dependent blood by HU analysis, no active extrav of blood or urine by imaging. IR aspiration performed 6/26 and fluid c/w hematoma only (not purulent, not simple/drainable). Repeat aspiration with insertion of 51F pigtail performed 7/14 at which time calculated volume using ellipsoid approximation . Hematoma drain has steadily put out dark hematoma fluid and repeat imaging 7/21 and 7/24 with calculated volume of hematoma . CT 8/1 with minimal interval change therefore hematoma drain removed.  5 - Hemorrhoids / Lower GI Bleed - Long h/o hemorroids. Pt with worsened / friable bleeding hemorroids on admission that are quite bothersome. No prior specific intervention, likely exacerbated by pelvic fluid collection / hematoma. Gen Surg recs conservative measures and now nearly resolved clinically.  6 - Nutrition / Nausea / C. Difficile- Pt with some element of baseline GI issues with GERD and prior small esophageal polyp. Has had exacerbation of nausea in house and not meeting nutritional goals. GI involved and initial EGD + KUB with ileus / gastroparesis picture. While ileus resolving placed on TPN and NGT for symptom relief and to bridge him with nutrition which was resumed 7/11 following successful decompression with rectal tube place with aide of sigmoidoscopy. C. Diff found positive 7/5 and started on dual therapy with flagyl + vanc enemas with plan for 14 day course which has ended. Rectal tube DC'd 7/17. Pt still with refractory nausea without emesis and new CT 7/21 without sig ileus patter or clear obstruction. Repeat sigmoidoscopy 7/23 with much improved rectal tissues and decreased extrinsic compression. TPN restarted 7/24 as not meeting caloric goals.  7 -  Disposition - PT eval has recommended DC to SNF when appropriate and family amenable. Family also interested in tertiary transfer, discussed with [facility] 7/27 Martin Majestic MD hospitalist transfer) and unwilling to accept at this time but amenable to future transfer if failing to progress.  PMH sig for obesity and left knee replacement, PE + DVT (now resolved). No CV disease.   Today Mr. Hellwig continues to remain tolerant of increasing PO intake a/w emesis on over 1 week. Also increasing activity.    Objective: Vital signs in last 24 hours: Temp:  [97.5 F (36.4 C)-98.2 F (36.8 C)] 98.2 F (36.8 C) (08/01 2135) Pulse Rate:  [60-91] 85 (08/01 2135) Resp:  [16-18] 18 (08/01 2135) BP: (93-117)/(73-84) 117/76 mmHg (08/01 2135) SpO2:  [99 %-100 %] 99 % (08/01 2135)  Intake/Output from previous day: 08/01 0701 - 08/02 0700 In: 1411.7 [I.V.:1206.7; IV Piggyback:200] Out: 5281 [Urine:5270; Drains:10; Stool:1] Intake/Output this shift: Total I/O In: -  Out: 2325 [Urine:2325]  Physical Exam:  General:alert and no distress   Lab Results: No results found for this basename: HGB, HCT,  in the last 72 hours BMET  Recent Labs  08/01/12 0425 08/02/12 0345  NA 138 137  K 4.1 4.3  CL 105 106  CO2 23 21  GLUCOSE 140* 153*  BUN 10 10  CREATININE 0.85 0.99  CALCIUM 9.1 9.4   No results found for this basename: LABPT, INR,  in the last 72 hours No results found for this basename: LABURIN,  in the last 72 hours Results for orders placed during the hospital encounter of 06/22/12  CULTURE, BLOOD (ROUTINE X 2)     Status: None   Collection Time    06/22/12 10:43 PM      Result Value Range Status   Specimen Description BLOOD LEFT ANTECUBITAL   Final   Special Requests BOTTLES DRAWN AEROBIC AND ANAEROBIC 5CC   Final   Culture  Setup Time 06/23/2012 17:47   Final   Culture NO GROWTH 5 DAYS   Final   Report Status 06/29/2012 FINAL   Final  CULTURE, BLOOD (ROUTINE X 2)     Status: None   Collection Time    06/22/12 10:57 PM      Result Value Range Status   Specimen Description BLOOD LEFT ARM   Final   Special Requests BOTTLES DRAWN AEROBIC AND  ANAEROBIC 10CC   Final   Culture  Setup Time 06/23/2012 17:47   Final   Culture NO GROWTH 5 DAYS   Final   Report Status 06/29/2012 FINAL   Final  URINE CULTURE     Status: None   Collection Time    06/23/12  2:17 AM      Result Value Range Status   Specimen Description URINE, CATHETERIZED   Final   Special Requests Normal   Final   Culture  Setup Time 06/23/2012 15:21   Final   Colony Count 35,000 COLONIES/ML   Final   Culture YEAST   Final   Report Status 06/24/2012 FINAL   Final  MRSA PCR SCREENING     Status: None   Collection Time    06/24/12  9:58 AM      Result Value Range Status   MRSA by PCR NEGATIVE  NEGATIVE Final   Comment:            The GeneXpert MRSA Assay (FDA     approved for NASAL specimens     only), is one component  of a     comprehensive MRSA colonization     surveillance program. It is not     intended to diagnose MRSA     infection nor to guide or     monitor treatment for     MRSA infections.  CULTURE, ROUTINE-ABSCESS     Status: None   Collection Time    06/27/12 12:51 PM      Result Value Range Status   Specimen Description ABSCESS PELVIC   Final   Special Requests NONE   Final   Gram Stain     Final   Value: MODERATE WBC PRESENT, PREDOMINANTLY PMN     NO SQUAMOUS EPITHELIAL CELLS SEEN     NO ORGANISMS SEEN   Culture NO GROWTH 3 DAYS   Final   Report Status 06/30/2012 FINAL   Final  CLOSTRIDIUM DIFFICILE BY PCR     Status: Abnormal   Collection Time    07/06/12 10:05 AM      Result Value Range Status   C difficile by pcr POSITIVE (*) NEGATIVE Final   Comment: CRITICAL RESULT CALLED TO, READ BACK BY AND VERIFIED WITH:     E.HARLESS,RN 1324 07/06/12 EHOWARD  CULTURE, ROUTINE-ABSCESS     Status: None   Collection Time    07/15/12  4:55 PM      Result Value Range Status   Specimen Description PERITONEAL CAVITY   Final   Special Requests NONE   Final   Gram Stain     Final   Value: NO WBC SEEN     NO SQUAMOUS EPITHELIAL CELLS SEEN     NO  ORGANISMS SEEN   Culture NO GROWTH 3 DAYS   Final   Report Status 07/18/2012 FINAL   Final  BODY FLUID CULTURE     Status: None   Collection Time    07/18/12  3:13 PM      Result Value Range Status   Specimen Description OTHER PELVIC   Final   Special Requests Normal   Final   Gram Stain     Final   Value: RARE WBC PRESENT, PREDOMINANTLY PMN     NO ORGANISMS SEEN   Culture NO GROWTH 3 DAYS   Final   Report Status 07/21/2012 FINAL   Final  CLOSTRIDIUM DIFFICILE BY PCR     Status: None   Collection Time    07/19/12  9:31 AM      Result Value Range Status   C difficile by pcr NEGATIVE  NEGATIVE Final  URINE CULTURE     Status: None   Collection Time    07/25/12  4:45 PM      Result Value Range Status   Specimen Description URINE, CLEAN CATCH   Final   Special Requests NONE   Final   Culture  Setup Time 07/26/2012 00:20   Final   Colony Count >=100,000 COLONIES/ML   Final   Culture KLEBSIELLA PNEUMONIAE   Final   Report Status 07/27/2012 FINAL   Final   Organism ID, Bacteria KLEBSIELLA PNEUMONIAE   Final  BODY FLUID CULTURE     Status: None   Collection Time    07/25/12  4:57 PM      Result Value Range Status   Specimen Description OTHER PELVIC DRAINAGE   Final   Special Requests Normal   Final   Gram Stain     Final   Value: NO WBC SEEN     ABUNDANT GRAM NEGATIVE RODS  Gram Stain Report Called to,Read Back By and Verified With: Gram Stain Report Called to,Read Back By and Verified With:  CAROL M @0713  ON 07/26/12 BY SMIAS   Culture ABUNDANT PSEUDOMONAS AERUGINOSA   Final   Report Status 07/29/2012 FINAL   Final   Organism ID, Bacteria PSEUDOMONAS AERUGINOSA   Final  CULTURE, BLOOD (ROUTINE X 2)     Status: None   Collection Time    07/25/12  5:10 PM      Result Value Range Status   Specimen Description BLOOD LEFT ARM   Final   Special Requests BOTTLES DRAWN AEROBIC ONLY 1CC   Final   Culture  Setup Time 07/26/2012 01:20   Final   Culture NO GROWTH 5 DAYS   Final    Report Status 08/01/2012 FINAL   Final  CULTURE, BLOOD (ROUTINE X 2)     Status: None   Collection Time    07/25/12  6:20 PM      Result Value Range Status   Specimen Description BLOOD LEFT ARM   Final   Special Requests BOTTLES DRAWN AEROBIC AND ANAEROBIC 10CC   Final   Culture  Setup Time 07/25/2012 22:48   Final   Culture NO GROWTH 5 DAYS   Final   Report Status 07/31/2012 FINAL   Final  CLOSTRIDIUM DIFFICILE BY PCR     Status: None   Collection Time    07/26/12 12:05 PM      Result Value Range Status   C difficile by pcr NEGATIVE  NEGATIVE Final    Studies/Results: Ct Pelvis Wo Contrast  08/02/2012   *RADIOLOGY REPORT*  Clinical Data: Evaluate pelvic hematoma and drain placement.  67- year-old male with history of bladder cancer status post cystoprostatectomy, ureteral revision and lymph node dissections.  CT PELVIS WITHOUT CONTRAST  Technique:  Multidetector CT imaging of the pelvis was performed following the standard protocol without intravenous contrast.  Comparison: Prior CT scan of the abdomen and pelvis 07/26/2012  Findings: Slightly decreased size of the large intermediate attenuation fluid collection in the pelvic retroperitoneal/presacral space which currently measures 10.9 x 7.6 by 6.3 cm compared to 12.1 x 7.7 by 6.3 cm.  The left gluteal approach percutaneous drainage catheter remains within the periphery of the fluid collection.  It has pulled back slightly compared to the initial placement.  Incompletely imaged bilateral externalized ureteral stents are seen entering the right lower quadrant diverting ileostomy and extending into the visualized ureters.  No new lung hematoma, fluid collection or abscess identified. No acute osseous abnormality.  No new lymphadenopathy.  Atherosclerotic calcifications noted in the distal aorta.  IMPRESSION:  1.  Slight interval decrease in size of the presacral pelvic hematoma which currently measures 10.9 x 7.6 x 6.3 cm compared to 12.1 x 7.7 x  6.3 cm.  The percutaneous drain remains within the periphery of the fluid collection although it has pulled back slightly.  2.  No new hematoma, abscess or fluid collection identified.   Original Report Authenticated By: Malachy Moan, M.D.   Ir Nephro Tube Remov/fl  08/02/2012   *RADIOLOGY REPORT*  Clinical Data: Ureteral stents, ileal conduit, left nephrostomy ready for removal  NEPHROSTOMY REMOVAL  Comparison: 08/02/2012 CT  Findings: Informed consent was obtained.  Time out performed.  Under sterile conditions, the existing left nephrostomy tube was injected and confirmed to be in the renal pelvis.  Catheter was cut and removed over a guidewire without difficulty.  No immediate complication.  Images obtained for  documentation.  Stable ureteral stent position.  IMPRESSION: Uncomplicated removal of the left nephrostomy.   Original Report Authenticated By: Judie Petit. Miles Costain, M.D.    Assessment/Plan: 1 - Bladder Cancer / Cystectomy / Ureteral Revision- NED form cancer perspective.Loopogram without leak this admission. Will likely remove bander stents in house after meeting more nutritional goals.  2 - Fever / h/o Bacteruria - Continue Cipro IV until tolerating PO further. Continue Diflucan for mild thrush.  3 - Heme / Recent DVT + PE -Lovenox now stopped. SCD's in place. PT following. Remain proph SQ heparin + SCD's.  4 - Pelvic Fluid Collection - Hematoma with approx 70% decreased volume with drain and drain removed today.  5 - Hemorrhoids / Lower GI Bleed - Agree with conservative measures for now. Hgb stable.  6 - Nutrition / Nausea / C. Difficile- Slowly progressing. Did not tolerate megace, continue other supportive measures and diet advancement.  7 - Disposition - May revisit role of transfer if current progress reverses.    No new recs or changes.   LOS: 42 days   Cloyce Blankenhorn C 08/03/2012, 3:52 AM

## 2012-08-03 NOTE — Progress Notes (Signed)
PARENTERAL NUTRITION CONSULT NOTE - Follow Up  Pharmacy Consult for TNA Indication: Gastroparesis/ileus  No Known Allergies  Patient Measurements: Height: 5\' 11"  (180.3 cm) Weight: 199 lb 11.8 oz (90.6 kg) IBW/kg (Calculated) : 75.3   Vital Signs: Temp: 97.8 F (36.6 C) (08/02 0609) Temp src: Oral (08/02 0609) BP: 123/80 mmHg (08/02 0609) Pulse Rate: 77 (08/02 0609) Intake/Output from previous day: 08/01 0701 - 08/02 0700 In: 3211.7 [I.V.:2406.7; IV Piggyback:200; TPN:600] Out: 5781 [Urine:5770; Drains:10; Stool:1] Intake/Output from this shift:    Labs: No results found for this basename: WBC, HGB, HCT, PLT, APTT, INR,  in the last 72 hours   Recent Labs  08/01/12 0425 08/02/12 0345 08/03/12 0340  NA 138 137 134*  K 4.1 4.3 4.6  CL 105 106 104  CO2 23 21 20   GLUCOSE 140* 153* 154*  BUN 10 10 8   CREATININE 0.85 0.99 0.87  CALCIUM 9.1 9.4 9.3  MG 2.1  --   --   PHOS 2.5  --   --   PROT 6.2  --   --   ALBUMIN 2.4*  --   --   AST 14  --   --   ALT 30  --   --   ALKPHOS 137*  --   --   BILITOT 0.3  --   --    Estimated Creatinine Clearance: 96.2 ml/min (by C-G formula based on Cr of 0.87).    Recent Labs  08/02/12 0606 08/02/12 1206 08/02/12 1808  GLUCAP 158* 161* 164*   Assessment:  67 YO M admitted 6/22 with abdominal pain s/p radical cystoprostatectomy for bladder cancer in Feb,2014. On 06/15/12 he required ureteral reimplantation into lleal conduit. Pelvic hematoma noted 6/22, IR aspiration and drain placed.  He had been unable to tolerate oral diet with persistent N/V, EGD done 7/1 revealed likely gastroparesis, abd films 7/2 consistent with ileus. NGT placed 7/2 with immediate brown-Elizette Shek drainage. Due to poor nutritional intake and ileus, TPN was started 7/2 PM and continued until 7/13 when pt appeared to be doing better with PO nutrition following successful decompression with rectal tube placement (since d/c'd 7/17).  As of 7/24, pt still with  refractory nausea without emesis and new CT 7/21 without sig ileus patter or clear obstruction. Repeat sigmoidoscopy 7/23 shows improvement per urology.  Patient has not been making good progress with PO nutrition and MD would like to resume TPN to meet 50% of nutritional goals while progressing.  TNA resumed 7/23 via PICC (placed 07/03/12).  Taking some liquids. Remeron started to help stimulate appetite. Pt refusing Megace. Taking Ensure twice a day.    Nutritional Goals:  - RD re-estimated needs 7/25: 2080-2270 Kcal, 100-120g protein, 2.7 L fluid/day - Clinimix E 5/15 at a rate of 48ml/hr + 20% fat emulsion at 58ml/hr to provide: 48g/day protein, 1162Kcal/day = ~50% of nutritional goals  Current nutrition:  - Diet: regular diet + Ensure TID - TNA: Clinimix E 5/15 at 40 ml/hr + 20% fat emulsion at 10 ml/hr - mIVF:  D51/2NS + 20 mEq/L KCl @ 100 ml/hr  CBGs & Insulin requirements past 24 hours:  158-164, 12 units given  Labs: Electrolytes: K+ 4.6. Patient tolerating KCl 40 mEq PO TID.  Other lytes WNL as well.  May consider reducing KCl to BID if K+ continues to trend up and diarrhea resolved.  F/u K+ in AM.   Renal Function: stable, good UOP Hepatic Function: AST/ALT WNL, Alk Phos increased but trending down.  Pre-Albumin: 16.6 (7/7), 11.1 (7/25), 14.0 (7/28) Triglycerides: WNL CBGs: CBGs mostly above goal and not controlled with moderate scale SSI, added 10 units regular insulin to TNA 8/1  Plan:    Continue Clinimix E 5/15 at goal rate of 40 ml/hr.    Continue with 10 units regular insulin/L in TNA.  IV fat emulsion 20% at 10 ml/hr daily.  Multivitamin with minerals tablet PO daily.  Continue moderate scale SSI q6h.   Defer management of MIVF to MD.  Increased to 100 ml/hr by MD 7/24.  TNA labs Monday/Thursdays.   Pharmacy will follow up daily.    Otho Bellows PharmD Pager [phone] 08/03/2012, 7:40 AM

## 2012-08-04 LAB — GLUCOSE, CAPILLARY
Glucose-Capillary: 143 mg/dL — ABNORMAL HIGH (ref 70–99)
Glucose-Capillary: 144 mg/dL — ABNORMAL HIGH (ref 70–99)
Glucose-Capillary: 211 mg/dL — ABNORMAL HIGH (ref 70–99)

## 2012-08-04 MED ORDER — INSULIN REGULAR HUMAN 100 UNIT/ML IJ SOLN
INTRAVENOUS | Status: AC
  Administered 2012-08-04: 18:00:00 via INTRAVENOUS
  Filled 2012-08-04: qty 1000

## 2012-08-04 MED ORDER — FAT EMULSION 20 % IV EMUL
240.0000 mL | INTRAVENOUS | Status: AC
  Administered 2012-08-04: 240 mL via INTRAVENOUS
  Filled 2012-08-04: qty 250

## 2012-08-04 MED ORDER — DRONABINOL 2.5 MG PO CAPS
2.5000 mg | ORAL_CAPSULE | Freq: Two times a day (BID) | ORAL | Status: DC
  Administered 2012-08-04 – 2012-08-07 (×7): 2.5 mg via ORAL
  Filled 2012-08-04 (×7): qty 1
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
## 2012-08-04 NOTE — Progress Notes (Signed)
PARENTERAL NUTRITION CONSULT NOTE - Follow Up  Pharmacy Consult for TNA Indication: Gastroparesis/ileus  No Known Allergies  Patient Measurements: Height: 5\' 11"  (180.3 cm) Weight: 199 lb 11.8 oz (90.6 kg) IBW/kg (Calculated) : 75.3   Vital Signs: Temp: 98.5 F (36.9 C) (08/03 0645) Temp src: Oral (08/03 0645) BP: 112/85 mmHg (08/03 0645) Pulse Rate: 86 (08/03 0645) Intake/Output from previous day: 08/02 0701 - 08/03 0700 In: 4380 [P.O.:100; I.V.:2586.7; IV Piggyback:400; TPN:1293.3] Out: 4345 [Urine:4345] Intake/Output from this shift:    Labs: No results found for this basename: WBC, HGB, HCT, PLT, APTT, INR,  in the last 72 hours   Recent Labs  08/02/12 0345 08/03/12 0340  NA 137 134*  K 4.3 4.6  CL 106 104  CO2 21 20  GLUCOSE 153* 154*  BUN 10 8  CREATININE 0.99 0.87  CALCIUM 9.4 9.3   Estimated Creatinine Clearance: 96.2 ml/min (by C-G formula based on Cr of 0.87).    Recent Labs  08/03/12 1739 08/03/12 2348 08/04/12 0632  GLUCAP 140* 143* 144*   Assessment:  67 YO M admitted 6/22 with abdominal pain s/p radical cystoprostatectomy for bladder cancer in Feb,2014. On 06/15/12 he required ureteral reimplantation into lleal conduit. Pelvic hematoma noted 6/22, IR aspiration and drain placed.  He had been unable to tolerate oral diet with persistent N/V, EGD done 7/1 revealed likely gastroparesis, abd films 7/2 consistent with ileus. NGT placed 7/2 with immediate brown-Shayla Heming drainage. Due to poor nutritional intake and ileus, TPN was started 7/2 PM and continued until 7/13 when pt appeared to be doing better with PO nutrition following successful decompression with rectal tube placement (since d/c'd 7/17).  As of 7/24, pt still with refractory nausea without emesis and new CT 7/21 without sig ileus patter or clear obstruction. Repeat sigmoidoscopy 7/23 shows improvement per urology.  Patient has not been making good progress with PO nutrition and MD would like to  resume TPN to meet 50% of nutritional goals while progressing.  TNA resumed 7/23 via PICC (placed 07/03/12).  Taking some liquids. Remeron started to help stimulate appetite. Pt refusing Megace. Taking Ensure twice a day (ordered tid)  Nutritional Goals:  - RD re-estimated needs 7/25: 2080-2270 Kcal, 100-120g protein, 2.7 L fluid/day - Clinimix E 5/15 at a rate of 53ml/hr + 20% fat emulsion at 49ml/hr to provide: 48g/day protein, 1162Kcal/day = ~50% of nutritional goals  Current nutrition:  - Diet: regular diet + Ensure TID - TNA: Clinimix E 5/15 at 40 ml/hr + 20% fat emulsion at 10 ml/hr - mIVF:  D51/2NS + 20 mEq/L KCl @ 100 ml/hr per MD  CBGs & Insulin requirements past 24 hours:  173->143, 7 units given  Labs: Electrolytes: K+ 4.6. Patient tolerating KCl 40 mEq PO TID.  Other lytes WNL as well.  May consider reducing KCl to BID if K+ continues to trend up and diarrhea resolved.  F/u K+ in AM.   Renal Function: stable, good UOP Hepatic Function: AST/ALT WNL, Alk Phos increased but trending down. Pre-Albumin: 16.6 (7/7), 11.1 (7/25), 14.0 (7/28) Triglycerides: WNL CBGs: CBGs improved with moderate scale SSI and 10 units regular insulin in TNA from 8/1  Plan:    Continue Clinimix E 5/15 at goal rate of 40 ml/hr.    Increase regular insulin to 15 units/L in TNA.  IV fat emulsion 20% at 10 ml/hr daily.  Multivitamin with minerals tablet PO daily.  Continue moderate scale SSI q6h.   Defer management of MIVF to MD.  Increased to 100 ml/hr by MD 7/24.  TNA labs Monday/Thursdays.   Pharmacy will follow up daily.    Otho Bellows PharmD Pager [phone] 08/04/2012, 7:27 AM

## 2012-08-05 LAB — COMPREHENSIVE METABOLIC PANEL
AST: 15 U/L (ref 0–37)
Albumin: 2.7 g/dL — ABNORMAL LOW (ref 3.5–5.2)
Calcium: 9.9 mg/dL (ref 8.4–10.5)
Chloride: 102 mEq/L (ref 96–112)
Creatinine, Ser: 0.87 mg/dL (ref 0.50–1.35)
Total Protein: 6.7 g/dL (ref 6.0–8.3)

## 2012-08-05 LAB — CBC
HCT: 45.3 % (ref 39.0–52.0)
Hemoglobin: 14.5 g/dL (ref 13.0–17.0)
MCH: 30.2 pg (ref 26.0–34.0)
MCHC: 32 g/dL (ref 30.0–36.0)
MCV: 94.4 fL (ref 78.0–100.0)
Platelets: 428 10*3/uL — ABNORMAL HIGH (ref 150–400)
RBC: 4.8 MIL/uL (ref 4.22–5.81)
RDW: 15.6 % — ABNORMAL HIGH (ref 11.5–15.5)
WBC: 8.8 10*3/uL (ref 4.0–10.5)

## 2012-08-05 LAB — DIFFERENTIAL
Basophils Relative: 1 % (ref 0–1)
Eosinophils Absolute: 0.3 10*3/uL (ref 0.0–0.7)
Eosinophils Relative: 3 % (ref 0–5)
Lymphs Abs: 2.1 10*3/uL (ref 0.7–4.0)

## 2012-08-05 LAB — GLUCOSE, CAPILLARY
Glucose-Capillary: 172 mg/dL — ABNORMAL HIGH (ref 70–99)
Glucose-Capillary: 184 mg/dL — ABNORMAL HIGH (ref 70–99)

## 2012-08-05 LAB — TRIGLYCERIDES: Triglycerides: 132 mg/dL (ref <150)

## 2012-08-05 LAB — PHOSPHORUS: Phosphorus: 4.4 mg/dL (ref 2.3–4.6)

## 2012-08-05 NOTE — Progress Notes (Signed)
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 Healed well initially and sent home 6/17. Loopogram 6/27 without internal leak. Bilaterl Bander stents in place and to remain until GI issues resolved. 8/1 removed left neph tube under fluoro.   2 - Fever / h/o Bacteruria - Pt with MRSA in urine by hospital UCX 03/2012 and again subsequently in blood. Now on Vancomycin daily per ID x 4-6 week total (nearing end of course). Most recent UCX, BCX negative, New set pending from 6/22. Pt readmitted with low-grade fever 6/22 to 100.7 and malaise, no sig leukocytosis. Now on Vanc + Zosyn emirically. UCX with yeast (trated diflucan x 3 days), BCX no growth to date.Pelvic fluid aspirate and no growth / final.  ID now following,and DC'd all ABX given eval except for current C. Diff treatment which finished and now CDiff negative 7/18. Repeat fever spike 7/24 with new BCX (NGTD), UCX (klebsiella), Drain CX(low-growth psudomonas , C. Diff (negative), Now on Cipro as narrowest coverage based on CX and began Diflucan course for mild oral candidiasis.  3 - Heme / Recent DVT + PE -incidental PE by CT 03/2012 now on Lovenox, DVT resolved by most recent LE duplex. Repeat CT-PE 6/23 without PE or active bleed from pelvis and Lovenox DC'd. Hgb drift to 7 6/24 and transfused 2 u with response to 8, transfused 2u additional 6/25 with increase to 10. Hematology consult [name]) also agrees no treatment dose anticoagulation at this point, but resume proph dosing since he is not ambulating much.  4 - Pelvic Fluid Collection - Pelvic fluid collection without  rim-enhancement noted on CT from ER 6/22. Appears partially simple fluid with some dependent blood by HU analysis, no active extrav of blood or urine by imaging. IR aspiration performed 6/26 and fluid c/w hematoma only (not purulent, not simple/drainable). Repeat aspiration with insertion of 14F pigtail performed 7/14 at which time calculated volume using ellipsoid approximation . Hematoma drain has steadily put out dark hematoma fluid and repeat imaging 7/21 and 7/24 with calculated volume of hematoma . CT 8/1 with minimal interval change therefore hematoma drain removed.   5 - Hemorrhoids / Lower GI Bleed - Long h/o hemorroids. Pt with worsened / friable bleeding hemorroids on admission that are quite bothersome. No prior specific intervention, likely exacerbated by pelvic fluid collection / hematoma. Gen Surg recs conservative measures and now nearly resolved clinically.  6 - Nutrition / Nausea / C. Difficile- Pt with some element of baseline GI issues with GERD and prior small esophageal polyp. Has had exacerbation of nausea in house and not meeting nutritional goals. GI involved and initial EGD + KUB with ileus / gastroparesis picture. While ileus resolving placed on TPN and NGT for symptom relief and to bridge him with nutrition which was resumed 7/11 following successful decompression with rectal tube place with aide of sigmoidoscopy. C. Diff found positive 7/5 and started on dual therapy with flagyl + vanc enemas with plan for 14 day course which has ended. Rectal tube DC'd 7/17. Pt still with refractory nausea without emesis and new CT 7/21 without sig ileus patter or clear obstruction. Repeat sigmoidoscopy 7/23 with much improved rectal tissues and decreased extrinsic compression. TPN restarted 7/24 as not meeting caloric goals and  now to be discontinued 8/4 to help stimulate appetite.  7 - Disposition - PT eval has recommended DC to SNF when appropriate and family amenable. Family also  interested in tertiary transfer, discussed with [facility] 7/27 Chad Olson hospitalist transfer) and unwilling to accept at this time but amenable to future transfer if failing to progress.  PMH sig for obesity and left knee replacement, PE + DVT (now resolved). No CV disease.   Today [name] continues to increase activity. He had signifiant increased PO intake yesterday and his loose stool frequency is decreasing.  Objective: Vital signs in last 24 hours: Temp:  [97.6 F (36.4 C)-97.9 F (36.6 C)] 97.8 F (36.6 C) (08/04 1329) Pulse Rate:  [71-89] 75 (08/04 1329) Resp:  [18-20] 18 (08/04 1329) BP: (103-126)/(72-80) 112/72 mmHg (08/04 1329) SpO2:  [98 %-99 %] 99 % (08/04 1329) Last BM Date: 08/04/12  Intake/Output from previous day: 08/03 0701 - 08/04 0700 In: 3060 [P.O.:240; I.V.:1613.3; IV Piggyback:400; TPN:806.7] Out: 5430 [Urine:5430] Intake/Output this shift:    General appearance: alert, cooperative, appears stated age and Wife and other family at bedside Head: Normocephalic, without obvious abnormality, atraumatic Eyes: conjunctivae/corneas clear. PERRL, EOM's intact. Fundi benign. Ears: normal TM's and external ear canals both ears Nose: Nares normal. Septum midline. Mucosa normal. No drainage or sinus tenderness. Throat: lips, mucosa, and tongue normal; teeth and gums normal Neck: no adenopathy, no carotid bruit, no JVD, supple, symmetrical, trachea midline and thyroid not enlarged, symmetric, no tenderness/mass/nodules Back: symmetric, no curvature. ROM normal. No CVA tenderness., Old neph tube site c/d/i. Resp: clear to auscultation bilaterally Chest wall: no tenderness Cardio: regular rate and rhythm, S1, S2 normal, no murmur, click, rub or gallop GI: soft, non-tender; bowel sounds normal; no masses,  no organomegaly Male genitalia: normal Extremities: extremities normal, atraumatic, no cyanosis or edema Pulses: 2+ and symmetric Skin: Skin color, texture, turgor  normal. No rashes or lesions Lymph nodes: Cervical, supraclavicular, and axillary nodes normal. Neurologic: Grossly normal Incision/Wound: RLQ Urostomy pink / patent with Bander stents in place. Old hematoma drain sites c/d/i.  Lab Results:   Recent Labs  08/05/12 0425  WBC 8.8  HGB 14.5  HCT 45.3  PLT 428*   BMET  Recent Labs  08/03/12 0340 08/05/12 0425  NA 134* 136  K 4.6 4.7  CL 104 102  CO2 20 25  GLUCOSE 154* 142*  BUN 8 11  CREATININE 0.87 0.87  CALCIUM 9.3 9.9   PT/INR No results found for this basename: LABPROT, INR,  in the last 72 hours ABG No results found for this basename: PHART, PCO2, PO2, HCO3,  in the last 72 hours  Studies/Results: No results found.  Anti-infectives: Anti-infectives   Start     Dose/Rate Route Frequency Ordered Stop   07/31/12 1800  fluconazole (DIFLUCAN) tablet 200 mg     200 mg Oral Every 24 hours 07/30/12 1911 08/10/12 1759   07/30/12 2015  fluconazole (DIFLUCAN) IVPB 800 mg    Comments:  First dose oral candidiasis, then transition to lower dose PO.   800 mg 200 mL/hr over 120 Minutes Intravenous  Once 07/30/12 1911 07/30/12 2255   07/29/12 1800  ciprofloxacin (CIPRO) IVPB 400 mg     400 mg 200 mL/hr over 60 Minutes Intravenous Every 12 hours 07/29/12 1706     07/25/12 2100  piperacillin-tazobactam (ZOSYN) IVPB 3.375 g  Status:  Discontinued     3.375 g 12.5 mL/hr over 240 Minutes Intravenous Every 8 hours 07/25/12  2047 07/29/12 1721   07/11/12 1200  vancomycin (VANCOCIN) 50 mg/mL oral solution 250 mg  Status:  Discontinued     250 mg Oral 4 times per day 07/11/12 1013 07/24/12 1159   07/06/12 1800  vancomycin (VANCOCIN) 500 mg in sodium chloride irrigation 0.9 % 100 mL ENEMA  Status:  Discontinued     500 mg Rectal 4 times per day 07/06/12 1335 07/11/12 1244   07/06/12 1430  metroNIDAZOLE (FLAGYL) IVPB 500 mg  Status:  Discontinued     500 mg 100 mL/hr over 60 Minutes Intravenous 3 times per day 07/06/12 1335 07/14/12  0725   06/25/12 1000  vancomycin (VANCOCIN) 1,250 mg in sodium chloride 0.9 % 250 mL IVPB  Status:  Discontinued     1,250 mg 166.7 mL/hr over 90 Minutes Intravenous Every 12 hours 06/25/12 0856 06/28/12 1102   06/24/12 2000  fluconazole (DIFLUCAN) IVPB 200 mg     200 mg 100 mL/hr over 60 Minutes Intravenous Every 24 hours 06/24/12 1733 06/26/12 2322   06/23/12 0900  vancomycin (VANCOCIN) 1,250 mg in sodium chloride 0.9 % 250 mL IVPB     1,250 mg 166.7 mL/hr over 90 Minutes Intravenous Every 12 hours 06/23/12 0522 06/24/12 2231   06/23/12 0600  piperacillin-tazobactam (ZOSYN) IVPB 3.375 g  Status:  Discontinued     3.375 g 12.5 mL/hr over 240 Minutes Intravenous 3 times per day 06/23/12 0447 06/28/12 1102   06/23/12 0330  cefTRIAXone (ROCEPHIN) 1 g in dextrose 5 % 50 mL IVPB     1 g 100 mL/hr over 30 Minutes Intravenous  Once 06/23/12 0319 06/23/12 0440      Assessment/Plan:  1 - Bladder Cancer / Cystectomy / Ureteral Revision- NED form cancer perspective.Loopogram without leak this admission. Will likely remove bander stents in house after meeting more nutritional goals.   2 - Fever / h/o Bacteruria - Continue PO based on most recent cultures. Continue Diflucan for mild thrush.  3 - Heme / Recent DVT + PE -Lovenox now stopped. SCD's in place. PT following. Remain proph SQ heparin + SCD's.   4 - Pelvic Fluid Collection - now drain free.  5 - Hemorrhoids / Lower GI Bleed - Agree with conservative measures for now. Hgb stable.  6 - Nutrition / Nausea / C. Difficile- Slowly progressing, stopping TPN today to see if this will help stimulate appetite.   7 - Disposition - May revisit role of transfer if current progress reverses.   [facility], Chad Olson 08/05/2012

## 2012-08-05 NOTE — Progress Notes (Signed)
PARENTERAL NUTRITION CONSULT NOTE - Follow Up  Pharmacy Consult for TNA Indication: Gastroparesis/ileus  No Known Allergies  Patient Measurements: Height: 5\' 11"  (180.3 cm) Weight: 199 lb 11.8 oz (90.6 kg) IBW/kg (Calculated) : 75.3   Vital Signs: Temp: 97.6 F (36.4 C) (08/04 0610) Temp src: Oral (08/04 0610) BP: 115/79 mmHg (08/04 0610) Pulse Rate: 86 (08/04 0610) Intake/Output from previous day: 08/03 0701 - 08/04 0700 In: 3060 [P.O.:240; I.V.:1613.3; IV Piggyback:400; TPN:806.7] Out: 5430 [Urine:5430] Intake/Output from this shift:    Labs:  Recent Labs  08/05/12 0425  WBC 8.8  HGB 14.5  HCT 45.3  PLT 428*     Recent Labs  08/03/12 0340 08/05/12 0425  NA 134* 136  K 4.6 4.7  CL 104 102  CO2 20 25  GLUCOSE 154* 142*  BUN 8 11  CREATININE 0.87 0.87  CALCIUM 9.3 9.9  MG  --  2.1  PHOS  --  4.4  PROT  --  6.7  ALBUMIN  --  2.7*  AST  --  15  ALT  --  18  ALKPHOS  --  110  BILITOT  --  0.2*  TRIG  --  132   Estimated Creatinine Clearance: 96.2 ml/min (by C-G formula based on Cr of 0.87).    Recent Labs  08/04/12 1731 08/05/12 0002 08/05/12 0608  GLUCAP 180* 129* 141*   Assessment:  67 YO M admitted 6/22 with abdominal pain s/p radical cystoprostatectomy for bladder cancer in Feb,2014. On 06/15/12 he required ureteral reimplantation into lleal conduit. Pelvic hematoma noted 6/22, IR aspiration and drain placed.  He had been unable to tolerate oral diet with persistent N/V, EGD done 7/1 revealed likely gastroparesis, abd films 7/2 consistent with ileus. NGT placed 7/2 with immediate brown-green drainage. Due to poor nutritional intake and ileus, TPN was started 7/2 PM and continued until 7/13 when pt appeared to be doing better with PO nutrition following successful decompression with rectal tube placement (since d/c'd 7/17).  As of 7/24, pt still with refractory nausea without emesis and new CT 7/21 without sig ileus patter or clear obstruction.  Repeat sigmoidoscopy 7/23 shows improvement per urology.  Patient has not been making good progress with PO nutrition and MD would like to resume TPN to meet 50% of nutritional goals while progressing.  TNA resumed 7/23 via PICC (placed 07/03/12).  Taking some liquids. Remeron started to help stimulate appetite. Pt refusing Megace, d/c 8/3 and started on Marinol.   Pt is taking Ensure 2-3 times per day (ordered TID)  TNA day #12 (in addition to previous 12 days, 8/2 -8/13).  Planning to complete current infusion at 1800 tonight and NOT continue TPN.  Nutritional Goals:  - RD re-estimated needs 7/25: 2080-2270 Kcal, 100-120g protein, 2.7 L fluid/day - Clinimix E 5/15 at a rate of 76ml/hr + 20% fat emulsion at 63ml/hr to provide: 48g/day protein, 1162Kcal/day = ~50% of nutritional goals  Current nutrition:  - Diet: regular diet + Ensure TID - TNA: Clinimix E 5/15 at 40 ml/hr + 20% fat emulsion at 10 ml/hr - mIVF:  D51/2NS + 20 mEq/L KCl @ 100 ml/hr per MD  CBGs & Insulin requirements past 24 hours:   CBG: 129-211  SSI:  12 units given  Labs: Electrolytes: WNL with K 4.7 today.  Patient tolerating KCl 40 mEq PO TID.  Consider reducing KCl to BID if K+ continues to trend up and diarrhea resolved. Renal Function: stable, good UOP Hepatic Function: AST/ALT WNL,  Alk Phos increased but trending down to WNL (8/4). Pre-Albumin: 16.6 (7/7), 11.1 (7/25), 14.0 (7/28), pending (8/4) Triglycerides: WNL CBGs: 129-211.  CBGs improved with 15 units regular insulin in TNA on 8/3 pm and moderate SSI.  Plan:    Continue current infusion until 1800 tonight, then d/c.  Multivitamin with minerals tablet PO daily.  Continue moderate scale SSI q6h.   Defer management of MIVF to MD.  Increased to 100 ml/hr by MD 7/24.  Lynann Beaver PharmD, BCPS Pager [phone] 08/05/2012 10:43 AM

## 2012-08-05 NOTE — Progress Notes (Signed)
PT Cancellation Note  Patient Details Name: Chad Olson MRN: [id number] DOB: May 21, 1945   Cancelled Treatment:    Reason Eval/Treat Not Completed: Other (comment) (pt up walking with family)   Donnetta Hail 08/05/2012, 4:52 PM

## 2012-08-05 NOTE — Progress Notes (Signed)
NUTRITION FOLLOW UP  Intervention:   - Encouraged pt to continue to drink Ensure Complete and eat. Hopefully Marinol will help with this.  - Will continue to monitor   Nutrition Dx:   Inadequate oral intake related to poor appetite/nausea/vomiting/diarrhea as evidenced by pt not eating - ongoing r/t poor appetite   Goal:   Pt to meet >/= 90% of their estimated nutrition needs; not met   Monitor:   Weights, labs, intake  Assessment:   Pt still has no appetite. Wife brought in several snacks and beverages, however pt refusing to eat. Wife encourages pt daily, RD also encouraged pt, however pt declined wanting anything. Pt started on Marinol. Wife reports pt has been consuming at least 2 Ensure Complete/day (each has 350 calories, 13g protein). TNA stopped today. Pt denies any nausea, vomiting, or diarrhea. States he has walked the halls once today.   Pt with slightly elevated blood sugars this morning, PALB and triglycerides WNL.   CBG (last 3)   Recent Labs  08/05/12 0002 08/05/12 0608 08/05/12 1144  GLUCAP 129* 141* 184*     Height: Ht Readings from Last 1 Encounters:  06/23/12 5\' 11"  (1.803 m)    Weight Status:   Wt Readings from Last 1 Encounters:  07/28/12 199 lb 11.8 oz (90.6 kg)    Re-estimated needs:  Kcal: 2080-2270 Protein: 100-120g Fluid: 2-2.2L/day  Skin: Intact   Diet Order: General   Intake/Output Summary (Last 24 hours) at 08/05/12 1616 Last data filed at 08/05/12 1329  Gross per 24 hour  Intake   1640 ml  Output   5175 ml  Net  -3535 ml    Last BM: 8/3   Labs:   Recent Labs Lab 08/01/12 0425 08/02/12 0345 08/03/12 0340 08/05/12 0425  NA 138 137 134* 136  K 4.1 4.3 4.6 4.7  CL 105 106 104 102  CO2 23 21 20 25   BUN 10 10 8 11   CREATININE 0.85 0.99 0.87 0.87  CALCIUM 9.1 9.4 9.3 9.9  MG 2.1  --   --  2.1  PHOS 2.5  --   --  4.4  GLUCOSE 140* 153* 154* 142*    CBG (last 3)   Recent Labs  08/05/12 0002 08/05/12 0608  08/05/12 1144  GLUCAP 129* 141* 184*    Scheduled Meds: . ciprofloxacin  400 mg Intravenous Q12H  . dronabinol  2.5 mg Oral BID AC  . feeding supplement  237 mL Oral TID WC  . fluconazole  200 mg Oral Q24H  . heparin subcutaneous  5,000 Units Subcutaneous Q8H  . hydrocortisone   Rectal TID  . insulin aspart  0-15 Units Subcutaneous Q6H  . metoprolol  5 mg Intravenous Q6H  . mirtazapine  7.5 mg Oral QHS  . multivitamin with minerals  1 tablet Oral Daily  . pantoprazole  40 mg Oral Daily  . potassium chloride  40 mEq Oral TID    Continuous Infusions: . dextrose 5 % and 0.45 % NaCl with KCl 20 mEq/L 100 mL/hr at 08/05/12 1100  . Marland KitchenTPN (CLINIMIX-E) Adult 40 mL/hr at 08/04/12 1731   And  . fat emulsion 240 mL (08/04/12 1732)     Levon Hedger MS, RD, LDN [phone] Pager [phone] After Hours Pager

## 2012-08-06 LAB — GLUCOSE, CAPILLARY

## 2012-08-06 NOTE — Progress Notes (Signed)
Patient ambulating in the hall with family independently. Family wishes to take patient home at this time. No further CSW needs noted. CSW to remain available for emotional support but otherwise, CSW signing off.  Kamon Fahr C. Hermela Hardt MSW, LCSW [phone]

## 2012-08-06 NOTE — Progress Notes (Signed)
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 Healed well initially and sent home 6/17. Loopogram 6/27 without internal leak. Bilaterl Bander stents in place and to remain until GI issues resolved. 8/1 removed left neph tube under fluoro.   2 - Fever / h/o Bacteruria - Pt with MRSA in urine by hospital UCX 03/2012 and again subsequently in blood. Now on Vancomycin daily per ID x 4-6 week total (nearing end of course). Most recent UCX, BCX negative, New set pending from 6/22. Pt readmitted with low-grade fever 6/22 to 100.7 and malaise, no sig leukocytosis. Now on Vanc + Zosyn emirically. UCX with yeast (trated diflucan x 3 days), BCX no growth to date.Pelvic fluid aspirate and no growth / final.  ID now following,and DC'd all ABX given eval except for current C. Diff treatment which finished and now CDiff negative 7/18. Repeat fever spike 7/24 with new BCX (NGTD), UCX (klebsiella), Drain CX(low-growth psudomonas , C. Diff (negative), Now on Cipro as narrowest coverage based on CX and began Diflucan course for mild oral candidiasis.  3 - Heme / Recent DVT + PE -incidental PE by CT 03/2012 now on Lovenox, DVT resolved by most recent LE duplex. Repeat CT-PE 6/23 without PE or active bleed from pelvis and Lovenox DC'd. Hgb drift to 7 6/24 and transfused 2 u with response to 8, transfused 2u additional 6/25 with increase to 10. Hematology consult [name]) also agrees no treatment dose anticoagulation at this point, but resume proph dosing since he is not ambulating much.  4 - Pelvic Fluid Collection - Pelvic fluid collection without  rim-enhancement noted on CT from ER 6/22. Appears partially simple fluid with some dependent blood by HU analysis, no active extrav of blood or urine by imaging. IR aspiration performed 6/26 and fluid c/w hematoma only (not purulent, not simple/drainable). Repeat aspiration with insertion of 83F pigtail performed 7/14 at which time calculated volume using ellipsoid approximation . Hematoma drain has steadily put out dark hematoma fluid and repeat imaging 7/21 and 7/24 with calculated volume of hematoma . CT 8/1 with minimal interval change therefore hematoma drain removed.   5 - Hemorrhoids / Lower GI Bleed - Long h/o hemorroids. Pt with worsened / friable bleeding hemorroids on admission that are quite bothersome. No prior specific intervention, likely exacerbated by pelvic fluid collection / hematoma. Gen Surg recs conservative measures and now nearly resolved clinically.  6 - Nutrition / Nausea / C. Difficile- Pt with some element of baseline GI issues with GERD and prior small esophageal polyp. Has had exacerbation of nausea in house and not meeting nutritional goals. GI involved and initial EGD + KUB with ileus / gastroparesis picture. While ileus resolving placed on TPN and NGT for symptom relief and to bridge him with nutrition which was resumed 7/11 following successful decompression with rectal tube place with aide of sigmoidoscopy. C. Diff found positive 7/5 and started on dual therapy with flagyl + vanc enemas with plan for 14 day course which has ended. Rectal tube DC'd 7/17. Pt still with refractory nausea without emesis and new CT 7/21 without sig ileus patter or clear obstruction. Repeat sigmoidoscopy 7/23 with much improved rectal tissues and decreased extrinsic compression. TPN restarted 7/24 as not meeting caloric goals and now  to be discontinued 8/4 to help stimulate appetite.  7 - Disposition - PT eval has recommended DC to SNF when appropriate and family amenable. Family also  interested in tertiary transfer, discussed with [facility] 7/27 Martin Majestic MD hospitalist transfer) and unwilling to accept at this time but amenable to future transfer if failing to progress.  PMH sig for obesity and left knee replacement, PE + DVT (now resolved). No CV disease.   Today Arkel is without complaints. He is now off TPN but is yet to have sig increased PO intake.  Objective: Vital signs in last 24 hours: Temp:  [97.4 F (36.3 C)-98 F (36.7 C)] 97.9 F (36.6 C) (08/05 2150) Pulse Rate:  [71-85] 71 (08/05 2150) Resp:  [18] 18 (08/05 2150) BP: (111-126)/(77-85) 112/81 mmHg (08/05 2150) SpO2:  [98 %-99 %] 98 % (08/05 2150) Last BM Date: 08/02/12  Intake/Output from previous day: 08/04 0701 - 08/05 0700 In: 1540 [P.O.:120; I.V.:1220; IV Piggyback:200] Out: 4325 [Urine:4325] Intake/Output this shift: Total I/O In: -  Out: 500 [Urine:500]  General appearance: alert, cooperative and appears stated age Head: Normocephalic, without obvious abnormality, atraumatic Eyes: conjunctivae/corneas clear. PERRL, EOM's intact. Fundi benign. Ears: normal TM's and external ear canals both ears Nose: Nares normal. Septum midline. Mucosa normal. No drainage or sinus tenderness. Throat: lips, mucosa, and tongue normal; teeth and gums normal Neck: no adenopathy, no carotid bruit, no JVD, supple, symmetrical, trachea midline and thyroid not enlarged, symmetric, no tenderness/mass/nodules Back: symmetric, no curvature. ROM normal. No CVA tenderness. Resp: clear to auscultation bilaterally Chest wall: no tenderness Cardio: regular rate and rhythm, S1, S2 normal, no murmur, click, rub or gallop GI: soft, non-tender; bowel sounds normal; no masses,  no organomegaly Male genitalia: normal Extremities: extremities normal, atraumatic, no cyanosis or edema Pulses: 2+ and symmetric Skin: Skin color, texture, turgor normal. No rashes or lesions Lymph nodes: Cervical, supraclavicular, and axillary  nodes normal. Neurologic: Grossly normal Incision/Wound: RLQ Urostomy pink  / patent with Bander stents in place. Old neph tube and hematoma tueb drain sites c/d/i.  Lab Results:   Recent Labs  08/05/12 0425  WBC 8.8  HGB 14.5  HCT 45.3  PLT 428*   BMET  Recent Labs  08/05/12 0425  NA 136  K 4.7  CL 102  CO2 25  GLUCOSE 142*  BUN 11  CREATININE 0.87  CALCIUM 9.9   PT/INR No results found for this basename: LABPROT, INR,  in the last 72 hours ABG No results found for this basename: PHART, PCO2, PO2, HCO3,  in the last 72 hours  Studies/Results: No results found.  Anti-infectives: Anti-infectives   Start     Dose/Rate Route Frequency Ordered Stop   07/31/12 1800  fluconazole (DIFLUCAN) tablet 200 mg     200 mg Oral Every 24 hours 07/30/12 1911 08/10/12 1759   07/30/12 2015  fluconazole (DIFLUCAN) IVPB 800 mg    Comments:  First dose oral candidiasis, then transition to lower dose PO.   800 mg 200 mL/hr over 120 Minutes Intravenous  Once 07/30/12 1911 07/30/12 2255   07/29/12 1800  ciprofloxacin (CIPRO) IVPB 400 mg     400 mg 200 mL/hr over 60 Minutes Intravenous Every 12 hours 07/29/12 1706     07/25/12 2100  piperacillin-tazobactam (ZOSYN) IVPB 3.375 g  Status:  Discontinued     3.375 g 12.5 mL/hr over 240 Minutes Intravenous Every 8 hours 07/25/12 2047 07/29/12 1721   07/11/12 1200  vancomycin (VANCOCIN) 50 mg/mL oral  solution 250 mg  Status:  Discontinued     250 mg Oral 4 times per day 07/11/12 1013 07/24/12 1159   07/06/12 1800  vancomycin (VANCOCIN) 500 mg in sodium chloride irrigation 0.9 % 100 mL ENEMA  Status:  Discontinued     500 mg Rectal 4 times per day 07/06/12 1335 07/11/12 1244   07/06/12 1430  metroNIDAZOLE (FLAGYL) IVPB 500 mg  Status:  Discontinued     500 mg 100 mL/hr over 60 Minutes Intravenous 3 times per day 07/06/12 1335 07/14/12 0725   06/25/12 1000  vancomycin (VANCOCIN) 1,250 mg in sodium chloride 0.9 % 250 mL IVPB  Status:   Discontinued     1,250 mg 166.7 mL/hr over 90 Minutes Intravenous Every 12 hours 06/25/12 0856 06/28/12 1102   06/24/12 2000  fluconazole (DIFLUCAN) IVPB 200 mg     200 mg 100 mL/hr over 60 Minutes Intravenous Every 24 hours 06/24/12 1733 06/26/12 2322   06/23/12 0900  vancomycin (VANCOCIN) 1,250 mg in sodium chloride 0.9 % 250 mL IVPB     1,250 mg 166.7 mL/hr over 90 Minutes Intravenous Every 12 hours 06/23/12 0522 06/24/12 2231   06/23/12 0600  piperacillin-tazobactam (ZOSYN) IVPB 3.375 g  Status:  Discontinued     3.375 g 12.5 mL/hr over 240 Minutes Intravenous 3 times per day 06/23/12 0447 06/28/12 1102   06/23/12 0330  cefTRIAXone (ROCEPHIN) 1 g in dextrose 5 % 50 mL IVPB     1 g 100 mL/hr over 30 Minutes Intravenous  Once 06/23/12 0319 06/23/12 0440      Assessment/Plan:   1 - Bladder Cancer / Cystectomy / Ureteral Revision- NED form cancer perspective.Loopogram without leak this admission. Will likely remove bander stents in house after meeting more nutritional goals.   2 - Fever / h/o Bacteruria - Continue PO based on most recent cultures. Continue Diflucan for mild thrush.  3 - Heme / Recent DVT + PE -Lovenox now stopped. SCD's in place. PT following. Remain proph SQ heparin + SCD's.   4 - Pelvic Fluid Collection - now drain free.  5 - Hemorrhoids / Lower GI Bleed - Agree with conservative measures for now. Hgb stable.  6 - Nutrition / Nausea / C. Difficile- Now off TPN, encourage PO intake.  7 - Disposition - May revisit role of transfer if current progress reverses.  [facility], Chad Olson 08/06/2012

## 2012-08-07 ENCOUNTER — Other Ambulatory Visit: Payer: Self-pay

## 2012-08-07 MED ORDER — CIPROFLOXACIN HCL 500 MG PO TABS
500.0000 mg | ORAL_TABLET | Freq: Two times a day (BID) | ORAL | Status: DC
  Administered 2012-08-07 – 2012-08-09 (×5): 500 mg via ORAL
  Filled 2012-08-07 (×7): qty 1

## 2012-08-07 MED ORDER — DRONABINOL 2.5 MG PO CAPS
2.5000 mg | ORAL_CAPSULE | Freq: Two times a day (BID) | ORAL | Status: DC
  Administered 2012-08-07 – 2012-08-09 (×5): 2.5 mg via ORAL
  Filled 2012-08-07 (×5): qty 1

## 2012-08-07 MED ORDER — METOPROLOL SUCCINATE ER 25 MG PO TB24
25.0000 mg | ORAL_TABLET | Freq: Every day | ORAL | Status: DC
  Administered 2012-08-07 – 2012-08-09 (×3): 25 mg via ORAL
  Filled 2012-08-07 (×3): qty 1

## 2012-08-07 NOTE — Progress Notes (Signed)
Physical Therapy Treatment Patient Details Name: Chad Olson MRN: [id number] DOB: 08/17/1945 Today's Date: 08/07/2012 Time: 1206-1230 PT Time Calculation (min): 24 min  PT Assessment / Plan / Recommendation  History of Present Illness pt without IVs or drains at this point    PT Comments    Pt has made good progress since I last saw him; He continues to amb with family multiple times per day and I have encouraged them to continue this routine; Pt is managing his own hygiene in the bathroom after set up from his sister; He completed  6 stairs today as well with PT at min/guard level; Based on this he may no longer be a rehab candidate and will likely be able to to D/C home with HHPT and supervision for mobility initially; Pt sister states she can stay with him, as she has been with him everyday in the hospital for 44 days now.  Follow Up Recommendations  Home health PT;Supervision for mobility/OOB     Does the patient have the potential to tolerate intense rehabilitation     Barriers to Discharge        Equipment Recommendations  None recommended by PT    Recommendations for Other Services    Frequency Min 3X/week   Progress towards PT Goals Progress towards PT goals: Progressing toward goals;Goals met and updated - see care plan  Plan Discharge plan needs to be updated    Precautions / Restrictions Precautions Precautions: Fall   Pertinent Vitals/Pain BP 110/96 HR 119 during therapy sats 100% on RA  No c/o pain Nausea better     Mobility  Bed Mobility Bed Mobility: Sit to Supine;Supine to Sit Supine to Sit: 5: Supervision;HOB flat Sit to Supine: HOB flat;5: Supervision;4: Min guard Details for Bed Mobility Assistance: incr time; min/guard to get RLE into bed Transfers Transfers: Sit to Stand;Stand to Sit Sit to Stand: 5: Supervision;6: Modified independent (Device/Increase time);From bed;From toilet;With upper extremity assist Stand to Sit: 6: Modified independent  (Device/Increase time);5: Supervision;To bed;To toilet;With upper extremity assist Details for Transfer Assistance: incr time Ambulation/Gait Ambulation/Gait Assistance: 5: Supervision;4: Min guard Ambulation Distance (Feet): 55 Feet (and 35' more) Assistive device: Rolling walker;None Ambulation/Gait Assistance Details: pt complains of  dizziness with amb; vitals checked were WNL; pt allowed to rest and encouraged to amb again;  Gait Pattern: Step-through pattern;Decreased stride length;Wide base of support;Trunk flexed Gait velocity: decreased General Gait Details: improving gait stability over all; he continues to amb with family multiple times per day Stairs: Yes Stairs Assistance: 4: Min assist;4: Min guard Stairs Assistance Details (indicate cue type and reason): cues for one step at a time and use of UE/rail as needed; pt requires encouragement to complete Stair Management Technique: One rail Right;Forwards Number of Stairs: 6    Exercises     PT Diagnosis:    PT Problem List:   PT Treatment Interventions:     PT Goals (current goals can now be found in the care plan section) Acute Rehab PT Goals PT Goal Formulation: With patient/family Time For Goal Achievement: 08/21/12 Potential to Achieve Goals: Good  Visit Information  Last PT Received On: 08/07/12 Assistance Needed: +1 History of Present Illness: pt without IVs or drains at this point     Subjective Data  Subjective: pt sister present   Cognition  Cognition Arousal/Alertness: Awake/alert Behavior During Therapy: Flat affect Overall Cognitive Status: Within Functional Limits for tasks assessed    Balance  Static Sitting Balance Static Sitting -  Balance Support: No upper extremity supported;Feet supported Static Sitting - Level of Assistance: 7: Independent Static Standing Balance Static Standing - Balance Support: No upper extremity supported Static Standing - Level of Assistance: 5: Stand by assistance   End of Session PT - End of Session Equipment Utilized During Treatment: Gait belt Activity Tolerance: Patient tolerated treatment well Patient left: in bed;with call bell/phone within reach;with family/visitor present   GP     [facility] 08/07/2012, 12:57 PM

## 2012-08-07 NOTE — Progress Notes (Signed)
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 Healed well initially and sent home 6/17. Loopogram 6/27 without internal leak. Bilaterl Bander stents in place and to remain until GI issues resolved. 8/1 removed left neph tube under fluoro.   2 - Fever / h/o Bacteruria - Pt with MRSA in urine by hospital UCX 03/2012 and again subsequently in blood. Now on Vancomycin daily per ID x 4-6 week total (nearing end of course). Most recent UCX, BCX negative, New set pending from 6/22. Pt readmitted with low-grade fever 6/22 to 100.7 and malaise, no sig leukocytosis. Now on Vanc + Zosyn emirically. UCX with yeast (trated diflucan x 3 days), BCX no growth to date.Pelvic fluid aspirate and no growth / final.  ID now following,and DC'd all ABX given eval except for current C. Diff treatment which finished and now CDiff negative 7/18. Repeat fever spike 7/24 with new BCX (NGTD), UCX (klebsiella), Drain CX(low-growth psudomonas , C. Diff (negative), Now on Cipro as narrowest coverage based on CX and began Diflucan course for mild oral candidiasis.  3 - Heme / Recent DVT + PE -incidental PE by CT 03/2012 now on Lovenox, DVT resolved by most recent LE duplex. Repeat CT-PE 6/23 without PE or active bleed from pelvis and Lovenox DC'd. Hgb drift to 7 6/24 and transfused 2 u with response to 8, transfused 2u additional 6/25 with increase to 10. Hematology consult [name]) also agrees no treatment dose anticoagulation at this point, but resume proph dosing since he is not ambulating much.  4 - Pelvic Fluid Collection - Pelvic fluid collection without  rim-enhancement noted on CT from ER 6/22. Appears partially simple fluid with some dependent blood by HU analysis, no active extrav of blood or urine by imaging. IR aspiration performed 6/26 and fluid c/w hematoma only (not purulent, not simple/drainable). Repeat aspiration with insertion of 58F pigtail performed 7/14 at which time calculated volume using ellipsoid approximation . Hematoma drain has steadily put out dark hematoma fluid and repeat imaging 7/21 and 7/24 with calculated volume of hematoma . CT 8/1 with minimal interval change therefore hematoma drain removed.   5 - Hemorrhoids / Lower GI Bleed - Long h/o hemorroids. Pt with worsened / friable bleeding hemorroids on admission that are quite bothersome. No prior specific intervention, likely exacerbated by pelvic fluid collection / hematoma. Gen Surg recs conservative measures and now nearly resolved clinically.  6 - Nutrition / Nausea / C. Difficile- Pt with some element of baseline GI issues with GERD and prior small esophageal polyp. Has had exacerbation of nausea in house and not meeting nutritional goals. GI involved and initial EGD + KUB with ileus / gastroparesis picture. While ileus resolving placed on TPN and NGT for symptom relief and to bridge him with nutrition which was resumed 7/11 following successful decompression with rectal tube place with aide of sigmoidoscopy. C. Diff found positive 7/5 and started on dual therapy with flagyl + vanc enemas with plan for 14 day course which has ended. Rectal tube DC'd 7/17. Pt still with refractory nausea without emesis and new CT 7/21 without sig ileus patter or clear obstruction. Repeat sigmoidoscopy 7/23 with much improved rectal tissues and decreased extrinsic compression. TPN restarted 7/24 as not meeting caloric goals and  now to be discontinued 8/4 to help stimulate appetite.  7 - Disposition - PT eval has recommended DC to SNF when appropriate and family amenable, he is making  great progress in this regard, but I feel DC to home may not yet be reasonable. Family also interested in tertiary transfer, discussed with [facility] 7/27 Martin Majestic MD hospitalist transfer) and unwilling to accept at this time but amenable to future transfer if failing to progress.  PMH sig for obesity and left knee replacement, PE + DVT (now resolved). No CV disease.   Today Chad Olson is doing well off TPN. Walked 4 times yesterday and appetite slowly improving.   Objective: Vital signs in last 24 hours: Temp:  [97.4 F (36.3 C)-97.9 F (36.6 C)] 97.8 F (36.6 C) (08/06 0449) Pulse Rate:  [71-85] 85 (08/06 0449) Resp:  [18] 18 (08/06 0449) BP: (111-126)/(75-85) 123/75 mmHg (08/06 0449) SpO2:  [98 %-99 %] 99 % (08/06 0449) Last BM Date: 08/02/12  Intake/Output from previous day: 08/05 0701 - 08/06 0700 In: 3605 [P.O.:720; I.V.:2485; IV Piggyback:400] Out: 2575 [Urine:2575] Intake/Output this shift:    General appearance: alert, cooperative and appears stated age Head: Normocephalic, without obvious abnormality, atraumatic Eyes: conjunctivae/corneas clear. PERRL, EOM's intact. Fundi benign. Ears: normal TM's and external ear canals both ears Nose: Nares normal. Septum midline. Mucosa normal. No drainage or sinus tenderness. Throat: lips, mucosa, and tongue normal; teeth and gums normal Neck: no adenopathy, no carotid bruit, no JVD, supple, symmetrical, trachea midline and thyroid not enlarged, symmetric, no tenderness/mass/nodules Back: symmetric, no curvature. ROM normal. No CVA tenderness. Resp: clear to auscultation bilaterally Chest wall: no tenderness Cardio: regular rate and rhythm, S1, S2 normal, no murmur, click, rub or gallop GI: soft, non-tender; bowel sounds normal; no masses,  no organomegaly Male genitalia: normal Extremities: extremities normal, atraumatic, no cyanosis or edema and RUE PICC c/d/i. Pulses: 2+ and symmetric Skin: Skin color, texture, turgor normal. No  rashes or lesions Lymph nodes: Cervical, supraclavicular, and axillary nodes normal. Neurologic: Grossly normal Incision/Wound: RLQ Urostomy pink / patent. Priro left neph tube and buttocks drain sites c/d/i.   Lab Results:   Recent Labs  08/05/12 0425  WBC 8.8  HGB 14.5  HCT 45.3  PLT 428*   BMET  Recent Labs  08/05/12 0425  NA 136  K 4.7  CL 102  CO2 25  GLUCOSE 142*  BUN 11  CREATININE 0.87  CALCIUM 9.9   PT/INR No results found for this basename: LABPROT, INR,  in the last 72 hours ABG No results found for this basename: PHART, PCO2, PO2, HCO3,  in the last 72 hours  Studies/Results: No results found.  Anti-infectives: Anti-infectives   Start     Dose/Rate Route Frequency Ordered Stop   08/07/12 0900  ciprofloxacin (CIPRO) tablet 500 mg     500 mg Oral 2 times daily 08/07/12 0746     07/31/12 1800  fluconazole (DIFLUCAN) tablet 200 mg     200 mg Oral Every 24 hours 07/30/12 1911 08/10/12 1759   07/30/12 2015  fluconazole (DIFLUCAN) IVPB 800 mg    Comments:  First dose oral candidiasis, then transition to lower dose PO.   800 mg 200 mL/hr over 120 Minutes Intravenous  Once 07/30/12 1911 07/30/12 2255   07/29/12 1800  ciprofloxacin (CIPRO) IVPB 400 mg  Status:  Discontinued     400 mg 200 mL/hr over 60 Minutes Intravenous Every 12 hours 07/29/12 1706 08/07/12 0746   07/25/12 2100  piperacillin-tazobactam (ZOSYN) IVPB 3.375 g  Status:  Discontinued     3.375 g 12.5 mL/hr over 240 Minutes Intravenous Every 8 hours 07/25/12 2047 07/29/12 1721   07/11/12 1200  vancomycin (VANCOCIN) 50 mg/mL oral solution 250 mg  Status:  Discontinued     250 mg Oral 4 times per day 07/11/12 1013 07/24/12 1159   07/06/12 1800  vancomycin (VANCOCIN) 500 mg in sodium chloride irrigation 0.9 % 100 mL ENEMA  Status:  Discontinued     500 mg Rectal 4 times per day 07/06/12 1335 07/11/12 1244   07/06/12 1430  metroNIDAZOLE (FLAGYL) IVPB 500 mg  Status:  Discontinued     500 mg 100  mL/hr over 60 Minutes Intravenous 3 times per day 07/06/12 1335 07/14/12 0725   06/25/12 1000  vancomycin (VANCOCIN) 1,250 mg in sodium chloride 0.9 % 250 mL IVPB  Status:  Discontinued     1,250 mg 166.7 mL/hr over 90 Minutes Intravenous Every 12 hours 06/25/12 0856 06/28/12 1102   06/24/12 2000  fluconazole (DIFLUCAN) IVPB 200 mg     200 mg 100 mL/hr over 60 Minutes Intravenous Every 24 hours 06/24/12 1733 06/26/12 2322   06/23/12 0900  vancomycin (VANCOCIN) 1,250 mg in sodium chloride 0.9 % 250 mL IVPB     1,250 mg 166.7 mL/hr over 90 Minutes Intravenous Every 12 hours 06/23/12 0522 06/24/12 2231   06/23/12 0600  piperacillin-tazobactam (ZOSYN) IVPB 3.375 g  Status:  Discontinued     3.375 g 12.5 mL/hr over 240 Minutes Intravenous 3 times per day 06/23/12 0447 06/28/12 1102   06/23/12 0330  cefTRIAXone (ROCEPHIN) 1 g in dextrose 5 % 50 mL IVPB     1 g 100 mL/hr over 30 Minutes Intravenous  Once 06/23/12 0319 06/23/12 0440      Assessment/Plan:  1 - Bladder Cancer / Cystectomy / Ureteral Revision- NED form cancer perspective.Loopogram without leak this admission. Will likely remove bander stents in house after meeting more nutritional goals.   2 - Fever / h/o Bacteruria - Continue PO based on most recent cultures. Continue Diflucan for mild thrush.  3 - Heme / Recent DVT + PE -Lovenox now stopped. SCD's in place. PT following. Remain proph SQ heparin + SCD's.   4 - Pelvic Fluid Collection - now drain free.  5 - Hemorrhoids / Lower GI Bleed - Agree with conservative measures for now. Hgb stable.  6 - Nutrition / Nausea / C. Difficile- Now off TPN, encourage PO intake.  7 - Disposition - discussed with pt again today plan for home v. Rehab. He is still very weak from his prolonged hospitalization and feels it would be safer at rehab, I strongly agree.  [facility], Elloise Roark 08/07/2012

## 2012-08-08 ENCOUNTER — Telehealth (HOSPITAL_COMMUNITY): Payer: Self-pay | Admitting: Licensed Clinical Social Worker

## 2012-08-08 LAB — COMPREHENSIVE METABOLIC PANEL
AST: 15 U/L (ref 0–37)
Albumin: 2.9 g/dL — ABNORMAL LOW (ref 3.5–5.2)
Alkaline Phosphatase: 108 U/L (ref 39–117)
BUN: 13 mg/dL (ref 6–23)
CO2: 26 mEq/L (ref 19–32)
Chloride: 100 mEq/L (ref 96–112)
Potassium: 4.7 mEq/L (ref 3.5–5.1)
Total Bilirubin: 0.4 mg/dL (ref 0.3–1.2)

## 2012-08-08 LAB — BASIC METABOLIC PANEL
Calcium: 10 mg/dL (ref 8.4–10.5)
GFR calc non Af Amer: 65 mL/min — ABNORMAL LOW (ref >90)
Potassium: 4.6 mEq/L (ref 3.5–5.1)
Sodium: 134 mEq/L — ABNORMAL LOW (ref 135–145)

## 2012-08-08 MED ORDER — PROMETHAZINE HCL 25 MG PO TABS
25.0000 mg | ORAL_TABLET | Freq: Four times a day (QID) | ORAL | Status: DC | PRN
  Administered 2012-08-08 – 2012-08-09 (×2): 25 mg via ORAL
  Filled 2012-08-08 (×2): qty 1

## 2012-08-08 NOTE — Progress Notes (Signed)
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 Healed well initially and sent home 6/17. Loopogram 6/27 without internal leak. Bilaterl Bander stents in place and to remain until GI issues resolved. 8/1 removed left neph tube under fluoro. 8/6 removed Rt bander stent, 8/7 removed Lt bander stent.  2 - Fever / h/o Bacteruria - Pt with MRSA in urine by hospital UCX 03/2012 and again subsequently in blood. Now on Vancomycin daily per ID x 4-6 week total (nearing end of course). Most recent UCX, BCX negative, New set pending from 6/22. Pt readmitted with low-grade fever 6/22 to 100.7 and malaise, no sig leukocytosis. Now on Vanc + Zosyn emirically. UCX with yeast (trated diflucan x 3 days), BCX no growth to date.Pelvic fluid aspirate and no growth / final.  ID now following,and DC'd all ABX given eval except for current C. Diff treatment which finished and now CDiff negative 7/18. Repeat fever spike 7/24 with new BCX (NGTD), UCX (klebsiella), Drain CX(low-growth psudomonas , C. Diff (negative), Now on Cipro as narrowest coverage based on CX and finishing Diflucan course for mild oral candidiasis.  3 - Heme / Recent DVT + PE -incidental PE by CT 03/2012 now on Lovenox, DVT resolved by most recent LE duplex. Repeat CT-PE 6/23 without PE or active bleed from pelvis and Lovenox DC'd. Hgb drift to 7 6/24 and transfused 2 u with response to 8, transfused 2u additional 6/25 with increase to 10. Hematology consult [name]) also agrees no treatment dose anticoagulation at this point, but resume proph dosing since he is not ambulating much.  4 - Pelvic  Fluid Collection - Pelvic fluid collection without rim-enhancement noted on CT from ER 6/22. Appears partially simple fluid with some dependent blood by HU analysis, no active extrav of blood or urine by imaging. IR aspiration performed 6/26 and fluid c/w hematoma only (not purulent, not simple/drainable). Repeat aspiration with insertion of 8F pigtail performed 7/14 at which time calculated volume using ellipsoid approximation . Hematoma drain has steadily put out dark hematoma fluid and repeat imaging 7/21 and 7/24 with calculated volume of hematoma . CT 8/1 with minimal interval change therefore hematoma drain removed.   5 - Hemorrhoids / Lower GI Bleed - Long h/o hemorroids. Pt with worsened / friable bleeding hemorroids on admission that are quite bothersome. No prior specific intervention, likely exacerbated by pelvic fluid collection / hematoma. Gen Surg recs conservative measures and now nearly resolved clinically.  6 - Nutrition / Nausea / C. Difficile- Pt with some element of baseline GI issues with GERD and prior small esophageal polyp. Has had exacerbation of nausea in house and not meeting nutritional goals. GI involved and initial EGD + KUB with ileus / gastroparesis picture. While ileus resolving placed on TPN and NGT for symptom relief and to bridge him with nutrition which was resumed 7/11 following successful decompression with rectal tube place with aide of sigmoidoscopy. C. Diff found positive 7/5 and started on dual therapy with flagyl + vanc enemas with plan for 14 day course which has ended. Rectal tube DC'd 7/17. Pt still with refractory nausea without emesis and new CT 7/21 without sig ileus patter or clear obstruction. Repeat sigmoidoscopy 7/23 with much improved rectal tissues and decreased extrinsic compression.  TPN restarted 7/24 as not meeting caloric goals and now to be discontinued 8/4 to help stimulate appetite.  7 - Disposition - PT eval initially recommended DC to  SNF when appropriate. Family also interested in tertiary transfer, discussed with [facility] 7/27 Martin Majestic MD hospitalist transfer) and unwilling to accept at this time but amenable to future transfer if failing to progress. Current plan DC home with HHPT as he is now making very good progress.  PMH sig for obesity and left knee replacement, PE + DVT (now resolved). No CV disease.   Today Chad Olson remains making steady progress. No large emesis in over 1 week and continues to keep down PO.   Objective: Vital signs in last 24 hours: Temp:  [97.3 F (36.3 C)-98.1 F (36.7 C)] 97.3 F (36.3 C) (08/07 1447) Pulse Rate:  [84-106] 89 (08/07 1447) Resp:  [16-18] 18 (08/07 1447) BP: (105-118)/(75-81) 118/80 mmHg (08/07 1447) SpO2:  [97 %-99 %] 99 % (08/07 1447) Last BM Date: 08/07/12  Intake/Output from previous day: 08/06 0701 - 08/07 0700 In: 660 [P.O.:660] Out: 3485 [Urine:3485] Intake/Output this shift: Total I/O In: 20 [I.V.:20] Out: 600 [Urine:600]  General appearance: alert, cooperative and appears stated age Head: Normocephalic, without obvious abnormality, atraumatic Eyes: conjunctivae/corneas clear. PERRL, EOM's intact. Fundi benign. Ears: normal TM's and external ear canals both ears Nose: Nares normal. Septum midline. Mucosa normal. No drainage or sinus tenderness. Throat: lips, mucosa, and tongue normal; teeth and gums normal Neck: no adenopathy, no carotid bruit, no JVD, supple, symmetrical, trachea midline and thyroid not enlarged, symmetric, no tenderness/mass/nodules Back: symmetric, no curvature. ROM normal. No CVA tenderness. Resp: clear to auscultation bilaterally Chest wall: no tenderness Cardio: regular rate and rhythm, S1, S2 normal, no murmur, click, rub or gallop GI: soft, non-tender; bowel sounds normal; no masses,  no organomegaly Male genitalia: normal Extremities: extremities normal, atraumatic, no cyanosis or edema and RUE PICC c/d/i. Pulses: 2+ and  symmetric Skin: Skin color, texture, turgor normal. No rashes or lesions Lymph nodes: Cervical, supraclavicular, and axillary nodes normal. Neurologic: Grossly normal Incision/Wound: RLQ Urostomy pink / patent. Both bander stents now out.  Lab Results:  No results found for this basename: WBC, HGB, HCT, PLT,  in the last 72 hours BMET  Recent Labs  08/08/12 0555 08/08/12 0855  NA 135 134*  K 4.7 4.6  CL 100 99  CO2 26 25  GLUCOSE 124* 171*  BUN 13 13  CREATININE 1.09 1.15  CALCIUM 10.0 10.0   PT/INR No results found for this basename: LABPROT, INR,  in the last 72 hours ABG No results found for this basename: PHART, PCO2, PO2, HCO3,  in the last 72 hours  Studies/Results: No results found.  Anti-infectives: Anti-infectives   Start     Dose/Rate Route Frequency Ordered Stop   08/07/12 0900  ciprofloxacin (CIPRO) tablet 500 mg     500 mg Oral 2 times daily 08/07/12 0746     07/31/12 1800  fluconazole (DIFLUCAN) tablet 200 mg     200 mg Oral Every 24 hours 07/30/12 1911 08/10/12 1759   07/30/12 2015  fluconazole (DIFLUCAN) IVPB 800 mg    Comments:  First dose oral candidiasis, then transition to lower dose PO.   800 mg 200 mL/hr over 120 Minutes Intravenous  Once 07/30/12 1911 07/30/12 2255   07/29/12 1800  ciprofloxacin (CIPRO) IVPB 400 mg  Status:  Discontinued     400 mg 200 mL/hr over 60 Minutes Intravenous Every 12 hours  07/29/12 1706 08/07/12 0746   07/25/12 2100  piperacillin-tazobactam (ZOSYN) IVPB 3.375 g  Status:  Discontinued     3.375 g 12.5 mL/hr over 240 Minutes Intravenous Every 8 hours 07/25/12 2047 07/29/12 1721   07/11/12 1200  vancomycin (VANCOCIN) 50 mg/mL oral solution 250 mg  Status:  Discontinued     250 mg Oral 4 times per day 07/11/12 1013 07/24/12 1159   07/06/12 1800  vancomycin (VANCOCIN) 500 mg in sodium chloride irrigation 0.9 % 100 mL ENEMA  Status:  Discontinued     500 mg Rectal 4 times per day 07/06/12 1335 07/11/12 1244   07/06/12  1430  metroNIDAZOLE (FLAGYL) IVPB 500 mg  Status:  Discontinued     500 mg 100 mL/hr over 60 Minutes Intravenous 3 times per day 07/06/12 1335 07/14/12 0725   06/25/12 1000  vancomycin (VANCOCIN) 1,250 mg in sodium chloride 0.9 % 250 mL IVPB  Status:  Discontinued     1,250 mg 166.7 mL/hr over 90 Minutes Intravenous Every 12 hours 06/25/12 0856 06/28/12 1102   06/24/12 2000  fluconazole (DIFLUCAN) IVPB 200 mg     200 mg 100 mL/hr over 60 Minutes Intravenous Every 24 hours 06/24/12 1733 06/26/12 2322   06/23/12 0900  vancomycin (VANCOCIN) 1,250 mg in sodium chloride 0.9 % 250 mL IVPB     1,250 mg 166.7 mL/hr over 90 Minutes Intravenous Every 12 hours 06/23/12 0522 06/24/12 2231   06/23/12 0600  piperacillin-tazobactam (ZOSYN) IVPB 3.375 g  Status:  Discontinued     3.375 g 12.5 mL/hr over 240 Minutes Intravenous 3 times per day 06/23/12 0447 06/28/12 1102   06/23/12 0330  cefTRIAXone (ROCEPHIN) 1 g in dextrose 5 % 50 mL IVPB     1 g 100 mL/hr over 30 Minutes Intravenous  Once 06/23/12 0319 06/23/12 0440      Assessment/Plan:  1 - Bladder Cancer / Cystectomy / Ureteral Revision- NED form cancer perspective.Loopogram without leak this admission. Bander stetns now out and will obtain repeat CT Urogram in AM to verify no leak before DC home.  2 - Fever / h/o Bacteruria - Continue PO based on most recent cultures as per-stent removal coverage.  3 - Heme / Recent DVT + PE -Lovenox now stopped. SCD's in place. PT following. Remain proph SQ heparin + SCD's.   4 - Pelvic Fluid Collection - now drain free.  5 - Hemorrhoids / Lower GI Bleed - Agree with conservative measures for now. Hgb stable.  6 - Nutrition / Nausea / C. Difficile- Now off TPN, encourage PO intake.  7 - Disposition - With current progress pt and family now want to take him home. PT eval concurs and HHPT RX'd. Pt and family very adept at ostomy care. Plan for DC home tomorrow.  [facility], Miran Kautzman 08/08/2012

## 2012-08-08 NOTE — Progress Notes (Signed)
Rounding visit at request of Chad Olson.  Mr Zaman is at ease and ready to return home. He has praise for the hospital and the staff for their excellent care.  Benjie Karvonen. Dywane Peruski, DMin Chaplain

## 2012-08-08 NOTE — Progress Notes (Signed)
RUA DL PICC without ability to flush or blood return from purple port. Opt not to tpa due to potential d/c home tomorrow.  Irving Burton RN spoke with MD and stated to not tpa line.  Red port continues to flush and GBR noted

## 2012-08-08 NOTE — Progress Notes (Signed)
Physical Therapy Treatment Patient Details Name: Chad Olson MRN: [id number] DOB: February 09, 1945 Today's Date: 08/08/2012 Time: 1020-1046 PT Time Calculation (min): 26 min  PT Assessment / Plan / Recommendation  History of Present Illness pt without IVs or drains at this point    PT Comments   Pt very willing but limited by overall weakness and feeling of "no energy".  Assisted pt OOB to amb in hallway with increased time and then performed B LE TE's while EOB.   Follow Up Recommendations  Home health PT;Supervision for mobility/OOB     Does the patient have the potential to tolerate intense rehabilitation     Barriers to Discharge        Equipment Recommendations  None recommended by PT    Recommendations for Other Services    Frequency Min 3X/week   Progress towards PT Goals Progress towards PT goals: Progressing toward goals  Plan Discharge plan needs to be updated    Precautions / Restrictions Precautions Precautions: Fall Restrictions Weight Bearing Restrictions: No    Pertinent Vitals/Pain No c/o pain    Mobility  Bed Mobility Bed Mobility: Sit to Supine Sit to Supine: HOB flat;5: Supervision;4: Min guard Details for Bed Mobility Assistance: increased time then pt needs to sit EOB severak minutes before OOB activity Transfers Transfers: Sit to Stand;Stand to Sit Sit to Stand: 5: Supervision;6: Modified independent (Device/Increase time);From bed;With upper extremity assist Stand to Sit: 6: Modified independent (Device/Increase time);5: Supervision;To bed;With upper extremity assist Details for Transfer Assistance: increased time with rest breaks between Ambulation/Gait Ambulation/Gait Assistance: 5: Supervision;4: Min guard Ambulation Distance (Feet): 55 Feet Assistive device: Rolling walker Ambulation/Gait Assistance Details: Used SW this session as that is what he uses at home.  Required increased time as pt c/o max fatigue/weakness throughout.  Gait  Pattern: Step-through pattern;Decreased stride length;Wide base of support;Trunk flexed Gait velocity: decreased General Gait Details: improving gait stability over all; he continues to amb with family multiple times per day    Exercises General Exercises - Lower Extremity Long Arc Quad: AROM;Both;15 reps (with a 5 sec hold) 10 reps shoulder shrugs along with trunk extension    PT Goals (current goals can now be found in the care plan section)    Visit Information  Last PT Received On: 08/08/12 Assistance Needed: +1 History of Present Illness: pt without IVs or drains at this point     Subjective Data      Cognition       Balance     End of Session PT - End of Session Equipment Utilized During Treatment: Gait belt Activity Tolerance: Patient tolerated treatment well Patient left: in bed (sitting EOB with spouse in room) Nurse Communication: Mobility status   [name]  PTA [facility]  Acute  Rehab Pager      [phone]

## 2012-08-09 ENCOUNTER — Inpatient Hospital Stay (HOSPITAL_COMMUNITY): Payer: BC Managed Care – PPO

## 2012-08-09 DIAGNOSIS — F329 Major depressive disorder, single episode, unspecified: Secondary | ICD-10-CM

## 2012-08-09 LAB — URINE CULTURE: Colony Count: 5000

## 2012-08-09 MED ORDER — ESCITALOPRAM OXALATE 10 MG PO TABS: 10.0000 mg | ORAL_TABLET | Freq: Every day | ORAL | Status: DC

## 2012-08-09 MED ORDER — MIRTAZAPINE 15 MG PO TABS: 15.0000 mg | ORAL_TABLET | Freq: Every day | ORAL | Status: DC

## 2012-08-09 MED ORDER — DRONABINOL 2.5 MG PO CAPS: 2.5000 mg | ORAL_CAPSULE | Freq: Two times a day (BID) | ORAL | Status: DC

## 2012-08-09 MED ORDER — MIRTAZAPINE 15 MG PO TABS
15.0000 mg | ORAL_TABLET | Freq: Every day | ORAL | Status: DC
Start: 2012-08-09 — End: 2012-08-09
  Filled 2012-08-09: qty 1

## 2012-08-09 MED ORDER — PROMETHAZINE HCL 25 MG PO TABS: 25.0000 mg | ORAL_TABLET | Freq: Four times a day (QID) | ORAL | Status: DC | PRN

## 2012-08-09 MED ORDER — IOHEXOL 300 MG/ML  SOLN
100.0000 mL | Freq: Once | INTRAMUSCULAR | Status: AC | PRN
  Administered 2012-08-09: 100 mL via INTRAVENOUS

## 2012-08-09 MED ORDER — ESCITALOPRAM OXALATE 10 MG PO TABS
10.0000 mg | ORAL_TABLET | Freq: Every day | ORAL | Status: DC
  Administered 2012-08-09: 10 mg via ORAL
  Filled 2012-08-09: qty 1

## 2012-08-09 MED ORDER — PANTOPRAZOLE SODIUM 40 MG PO TBEC: 40.0000 mg | DELAYED_RELEASE_TABLET | Freq: Every day | ORAL | Status: DC
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
## 2012-08-09 NOTE — Progress Notes (Signed)
PT Cancellation Note  ___Treatment cancelled today due to medical issues with patient which prohibited therapy  ___ Treatment cancelled today due to patient receiving procedure or test   ___ Treatment cancelled today due to patient's refusal to participate   _X_ Treatment cancelled today due to pt resting this afternoon, preparing for D/C to home today.  Sister reports she amb pt earlier "all the way down the hallway without the walker".   [name]  PTA [facility]  Acute  Rehab Pager      [phone]

## 2012-08-09 NOTE — Discharge Summary (Signed)
Physician Discharge Summary  Patient ID: Chad Olson MRN: [id number] DOB/AGE: 05-07-1945 67 y.o.  Admit date: 06/22/2012 Discharge date: 08/09/2012  Admission Diagnoses: Pelvic Hematoma, Failure to Thrive, DVT  Discharge Diagnoses:  Principal Problem:   Pelvic hematoma, male Active Problems:   Bladder cancer s/p robotic prostatecystectomy [phone]   Acute pulmonary embolism (3/14)   DVT (deep venous thrombosis) 3/14   Fever   Bacteremia MRSA 5/14   VTE (venous thromboembolism)   Protein-calorie malnutrition, severe   Acute blood loss anemia   Gastroparesis   Colonic obstruction   Hypokalemia   Anorexia   Diarrhea   Discharged Condition: fair  Hospital Course:   Pt with long and protracted hospital stay related to complications from large pelvic hematoma after ureteral-conduit revision surgery 06/2012.  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 Healed well initially and sent home 6/17. Loopogram 6/27 without internal leak. 8/1 removed left neph tube under fluoro. 8/6 removed Rt bander stent, 8/7 removed Lt bander stent. Final imaging with CT Urogram 08/09/12 (day of discharge) without any evidence of recurrent tumor or leaking anastomoses.  2 - Fever / h/o Bacteruria - Pt with MRSA in urine by hospital UCX 03/2012 and again subsequently in blood.  Pt readmitted with low-grade fever 6/22 to 100.7 and malaise, no sig leukocytosis. Treated initially with Vanc + Zosyn emirically. UCX with yeast (trated diflucan x 3 days), BCX no growth to date.Pelvic fluid aspirate and no growth / final. ID DC'd all ABX given eval except for  C. Diff  treatment which finished and  CDiff negative 7/18. Repeat fever spike 7/24 with new BCX (NGTD), UCX (klebsiella), Drain CX(low-growth psudomonas , C. Diff (negative), treated with Cipro course, finshed at discharge.   3 - Heme / Recent DVT + PE -incidental PE by CT 03/2012  on Lovenox at admission , DVT resolved by most recent LE duplex. Repeat CT-PE 6/23 without PE or active bleed from pelvis and Lovenox DC'd. Hgb drift to 7 6/24 and transfused 2 u with response to 8, transfused 2u additional 6/25 with increase to 10. Hematology consult [name]) also agrees no treatment dose anticoagulation at this point/   4 - Large Pelvic Fluid Hematoma - Pelvic fluid collection without rim-enhancement noted on CT from ER 6/22. Appears partially simple fluid with some dependent blood by HU analysis, no active extrav of blood or urine by imaging. IR aspiration performed 6/26 and fluid c/w hematoma only (not purulent, not simple/drainable). Repeat aspiration with insertion of 48F pigtail performed 7/14 at which time calculated volume using ellipsoid approximation . Hematoma drain has steadily put out dark hematoma fluid and repeat imaging 7/21 and 7/24 with calculated volume of hematoma . CT 8/1 with minimal interval change therefore hematoma drain removed.   5 - Hemorrhoids / Lower GI Bleed - Long h/o hemorroids. Pt with worsened / friable bleeding hemorroids on admission that are quite bothersome. No prior specific intervention, likely exacerbated by pelvic fluid collection / hematoma. Gen Surg recs conservative measures and now nearly resolved clinically.   6 - Nutrition / Nausea / C. Difficile- Pt with some element of baseline GI issues with GERD and prior small esophageal polyp. Has had exacerbation of nausea in house and not meeting nutritional goals. GI involved  and initial EGD + KUB with ileus / gastroparesis picture. While ileus resolving placed on TPN and NGT for symptom relief and to bridge him  with nutrition which was resumed 7/11 following successful decompression with rectal tube place with aide of sigmoidoscopy. C. Diff found positive 7/5 and started on dual therapy with flagyl + vanc enemas with plan for 14 day course which has ended. Rectal tube DC'd 7/17. Repeat sigmoidoscopy 7/23 with much improved rectal tissues and decreased extrinsic compression. TPN restarted 7/24 as not meeting caloric goals and  discontinued 8/4 to help stimulate appetite. At discharge pt tolerating PO intake with supplement shakes and solid food as well and on marinol.  7 - Psche - Pt with significant depression related to protracted hospital stay. Placed on Mirtazapine gy GI for this and appetite stimulant. Formal Psychiatry consult 8/8 recommended increased mirtazapine with addition of lexapro.   8 - Disposition - PT eval by discharge rec HHPT, HHRN, HHOT which have all been arranged.   Consults: ID, GI, hematology/oncology, psychiatry and general surgery  Significant Diagnostic Studies: labs: Hgb >9 at discharge. Multiple radiology studies as per above.   Treatments: TPN, Antibiotics, Percutaneous Drains  Discharge Exam: Blood pressure 124/91, pulse 96, temperature 97.7 F (36.5 C), temperature source Oral, resp. rate 18, height 5\' 11"  (1.803 m), weight 90.6 kg (199 lb 11.8 oz), SpO2 98.00%. General appearance: alert, cooperative, appears stated age and much more vigorous last few days Head: Normocephalic, without obvious abnormality, atraumatic Eyes: conjunctivae/corneas clear. PERRL, EOM's intact. Fundi benign. Ears: normal TM's and external ear canals both ears Nose: Nares normal. Septum midline. Mucosa normal. No drainage or sinus tenderness. Throat: lips, mucosa, and tongue normal; teeth and gums normal Neck: no adenopathy, no carotid bruit, no JVD, supple, symmetrical, trachea midline and thyroid not enlarged, symmetric, no tenderness/mass/nodules Back: symmetric, no curvature. ROM normal. No  CVA tenderness. Resp: clear to auscultation bilaterally Chest wall: no tenderness Cardio: regular rate and rhythm, S1, S2 normal, no murmur, click, rub or gallop GI: soft, non-tender; bowel sounds normal; no masses,  no organomegaly Male genitalia: normal Extremities: extremities normal, atraumatic, no cyanosis or edema and Old RUE PICC site c/d/i. Pulses: 2+ and symmetric Skin: Skin color, texture, turgor normal. No rashes or lesions Lymph nodes: Cervical, supraclavicular, and axillary nodes normal. Neurologic: Grossly normal Incision/Wound: RLQ Urostomy pink / patent with copious clear urine. Prior left neph tube site and left buttocks hematoma drain sites c/d/i.  Disposition: 06-Home-Health Care Svc     Medication List    STOP taking these medications       enoxaparin 120 MG/0.8ML injection  Commonly known as:  LOVENOX     oxyCODONE-acetaminophen 5-325 MG per tablet  Commonly known as:  ROXICET     sodium chloride 0.9 % SOLN 250 mL with vancomycin 10 G SOLR      TAKE these medications       dronabinol 2.5 MG capsule  Commonly known as:  MARINOL  Take 1 capsule (2.5 mg total) by mouth 2 (two) times daily between meals.     escitalopram 10 MG tablet  Commonly known as:  LEXAPRO  Take 1 tablet (10 mg total) by mouth daily.     mirtazapine 15 MG tablet  Commonly known as:  REMERON  Take 1 tablet (15 mg total) by mouth at bedtime.     multivitamin with minerals Tabs tablet  Take 1 tablet by mouth daily.     pantoprazole 40 MG tablet  Commonly  known as:  PROTONIX  Take 1 tablet (40 mg total) by mouth daily.     pravastatin 20 MG tablet  Commonly known as:  PRAVACHOL  Take 20 mg by mouth every evening.     PROBIOTIC DAILY PO  Take 1 capsule by mouth daily.     promethazine 25 MG tablet  Commonly known as:  PHENERGAN  Take 1 tablet (25 mg total) by mouth every 6 (six) hours as needed for nausea (nausea).           Follow-up Information   Follow up with  Sebastian Ache, MD. (We will arrange outpatietn visit in about 3-4 weeks.)    Contact information:   [address] [place] [address] [phone]       Signed: Sebastian Ache 08/09/2012, 5:29 PM

## 2012-08-20 ENCOUNTER — Other Ambulatory Visit: Payer: Self-pay

## 2012-11-07 ENCOUNTER — Other Ambulatory Visit: Payer: Self-pay

## 2012-11-25 ENCOUNTER — Ambulatory Visit (INDEPENDENT_AMBULATORY_CARE_PROVIDER_SITE_OTHER): Payer: BC Managed Care – PPO | Admitting: Surgery

## 2012-11-25 ENCOUNTER — Encounter (INDEPENDENT_AMBULATORY_CARE_PROVIDER_SITE_OTHER): Payer: Self-pay | Admitting: Surgery

## 2012-11-25 ENCOUNTER — Other Ambulatory Visit (INDEPENDENT_AMBULATORY_CARE_PROVIDER_SITE_OTHER): Payer: Self-pay

## 2012-11-25 VITALS — BP 140/88 | HR 66 | Temp 98.2°F | Resp 14 | Ht 71.0 in | Wt 226.6 lb

## 2012-11-25 DIAGNOSIS — K432 Incisional hernia without obstruction or gangrene: Secondary | ICD-10-CM
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
## 2012-11-25 NOTE — Patient Instructions (Signed)
Will refer to [facility] for opinion about hernia.

## 2012-11-26 ENCOUNTER — Telehealth (INDEPENDENT_AMBULATORY_CARE_PROVIDER_SITE_OTHER): Payer: Self-pay | Admitting: *Deleted

## 2012-11-26 NOTE — Telephone Encounter (Signed)
I spoke with pts wife Chad Olson and made her aware of pts appt information to see Dr. Barnetta Chapel at [facility].  That appt is on 01/09/13 at 11:00am.  She is aware of their located and I provided her with their phone number.  She is agreeable to this appt and I instructed her to try and call periodically and see if they have any cancellations to get pt seen sooner.

## 2013-01-02 HISTORY — PX: HERNIA REPAIR: SHX51

## 2013-01-09 DIAGNOSIS — K432 Incisional hernia without obstruction or gangrene: Secondary | ICD-10-CM | POA: Diagnosis not present

## 2013-01-09 DIAGNOSIS — Z7901 Long term (current) use of anticoagulants: Secondary | ICD-10-CM | POA: Diagnosis not present

## 2013-01-15 DIAGNOSIS — K633 Ulcer of intestine: Secondary | ICD-10-CM | POA: Diagnosis not present

## 2013-01-15 DIAGNOSIS — K56609 Unspecified intestinal obstruction, unspecified as to partial versus complete obstruction: Secondary | ICD-10-CM | POA: Diagnosis not present

## 2013-01-16 DIAGNOSIS — R634 Abnormal weight loss: Secondary | ICD-10-CM | POA: Diagnosis not present

## 2013-01-16 DIAGNOSIS — N179 Acute kidney failure, unspecified: Secondary | ICD-10-CM | POA: Diagnosis not present

## 2013-01-16 DIAGNOSIS — E7800 Pure hypercholesterolemia, unspecified: Secondary | ICD-10-CM | POA: Diagnosis not present

## 2013-01-16 DIAGNOSIS — K56609 Unspecified intestinal obstruction, unspecified as to partial versus complete obstruction: Secondary | ICD-10-CM | POA: Diagnosis not present

## 2013-01-16 DIAGNOSIS — Z5181 Encounter for therapeutic drug level monitoring: Secondary | ICD-10-CM | POA: Diagnosis not present

## 2013-01-16 DIAGNOSIS — R5383 Other fatigue: Secondary | ICD-10-CM | POA: Diagnosis not present

## 2013-01-16 DIAGNOSIS — C679 Malignant neoplasm of bladder, unspecified: Secondary | ICD-10-CM | POA: Diagnosis not present

## 2013-01-16 DIAGNOSIS — Z7901 Long term (current) use of anticoagulants: Secondary | ICD-10-CM | POA: Diagnosis not present

## 2013-01-16 DIAGNOSIS — R5381 Other malaise: Secondary | ICD-10-CM | POA: Diagnosis not present

## 2013-03-21 DIAGNOSIS — N135 Crossing vessel and stricture of ureter without hydronephrosis: Secondary | ICD-10-CM | POA: Diagnosis not present

## 2013-03-21 DIAGNOSIS — C679 Malignant neoplasm of bladder, unspecified: Secondary | ICD-10-CM | POA: Diagnosis not present

## 2013-03-24 DIAGNOSIS — K439 Ventral hernia without obstruction or gangrene: Secondary | ICD-10-CM | POA: Diagnosis not present

## 2013-03-24 DIAGNOSIS — C679 Malignant neoplasm of bladder, unspecified: Secondary | ICD-10-CM | POA: Diagnosis not present

## 2013-03-24 DIAGNOSIS — N135 Crossing vessel and stricture of ureter without hydronephrosis: Secondary | ICD-10-CM | POA: Diagnosis not present

## 2013-04-15 DIAGNOSIS — D075 Carcinoma in situ of prostate: Secondary | ICD-10-CM | POA: Diagnosis not present

## 2013-04-15 DIAGNOSIS — R630 Anorexia: Secondary | ICD-10-CM | POA: Diagnosis not present

## 2013-04-15 DIAGNOSIS — I10 Essential (primary) hypertension: Secondary | ICD-10-CM | POA: Diagnosis not present

## 2013-04-15 DIAGNOSIS — E785 Hyperlipidemia, unspecified: Secondary | ICD-10-CM | POA: Diagnosis not present

## 2013-04-15 DIAGNOSIS — E119 Type 2 diabetes mellitus without complications: Secondary | ICD-10-CM | POA: Diagnosis not present

## 2013-04-15 DIAGNOSIS — C679 Malignant neoplasm of bladder, unspecified: Secondary | ICD-10-CM | POA: Diagnosis not present

## 2013-06-25 DIAGNOSIS — Z8614 Personal history of Methicillin resistant Staphylococcus aureus infection: Secondary | ICD-10-CM | POA: Diagnosis not present

## 2013-06-25 DIAGNOSIS — F3289 Other specified depressive episodes: Secondary | ICD-10-CM | POA: Diagnosis present

## 2013-06-25 DIAGNOSIS — I10 Essential (primary) hypertension: Secondary | ICD-10-CM | POA: Diagnosis present

## 2013-06-25 DIAGNOSIS — E119 Type 2 diabetes mellitus without complications: Secondary | ICD-10-CM | POA: Diagnosis present

## 2013-06-25 DIAGNOSIS — F329 Major depressive disorder, single episode, unspecified: Secondary | ICD-10-CM | POA: Diagnosis present

## 2013-06-25 DIAGNOSIS — K219 Gastro-esophageal reflux disease without esophagitis: Secondary | ICD-10-CM | POA: Diagnosis present

## 2013-06-25 DIAGNOSIS — E785 Hyperlipidemia, unspecified: Secondary | ICD-10-CM | POA: Diagnosis present

## 2013-06-25 DIAGNOSIS — Z86711 Personal history of pulmonary embolism: Secondary | ICD-10-CM | POA: Diagnosis not present

## 2013-06-25 DIAGNOSIS — K432 Incisional hernia without obstruction or gangrene: Secondary | ICD-10-CM | POA: Diagnosis not present

## 2013-06-25 DIAGNOSIS — R109 Unspecified abdominal pain: Secondary | ICD-10-CM | POA: Diagnosis not present

## 2013-06-25 DIAGNOSIS — G8918 Other acute postprocedural pain: Secondary | ICD-10-CM | POA: Diagnosis not present

## 2013-06-25 DIAGNOSIS — Z96659 Presence of unspecified artificial knee joint: Secondary | ICD-10-CM | POA: Diagnosis not present

## 2013-08-11 ENCOUNTER — Encounter: Payer: Self-pay | Admitting: Gastroenterology

## 2013-09-19 ENCOUNTER — Ambulatory Visit (HOSPITAL_COMMUNITY)
Admission: RE | Admit: 2013-09-19 | Discharge: 2013-09-19 | Disposition: A | Discharge status: Home / Self Care | Payer: BC Managed Care – PPO | Source: Ambulatory Visit | Attending: Urology | Admitting: Urology

## 2013-09-19 ENCOUNTER — Other Ambulatory Visit (HOSPITAL_COMMUNITY): Payer: Self-pay | Admitting: Urology

## 2013-09-19 DIAGNOSIS — C679 Malignant neoplasm of bladder, unspecified: Secondary | ICD-10-CM | POA: Insufficient documentation

## 2013-10-23 DIAGNOSIS — E785 Hyperlipidemia, unspecified: Secondary | ICD-10-CM | POA: Diagnosis not present

## 2013-10-27 ENCOUNTER — Ambulatory Visit (INDEPENDENT_AMBULATORY_CARE_PROVIDER_SITE_OTHER): Payer: BC Managed Care – PPO | Admitting: Neurology

## 2013-10-27 ENCOUNTER — Encounter: Payer: Self-pay | Admitting: Neurology

## 2013-10-27 VITALS — BP 146/88 | HR 71 | Temp 97.3°F | Ht 70.0 in | Wt 252.0 lb

## 2013-10-27 DIAGNOSIS — E669 Obesity, unspecified: Secondary | ICD-10-CM | POA: Diagnosis not present

## 2013-10-27 DIAGNOSIS — G4719 Other hypersomnia: Secondary | ICD-10-CM

## 2013-10-27 DIAGNOSIS — R0683 Snoring: Secondary | ICD-10-CM | POA: Diagnosis not present

## 2013-10-27 DIAGNOSIS — R413 Other amnesia: Secondary | ICD-10-CM | POA: Diagnosis not present

## 2013-10-27 DIAGNOSIS — H9193 Unspecified hearing loss, bilateral: Secondary | ICD-10-CM | POA: Diagnosis not present

## 2013-10-27 DIAGNOSIS — I82409 Acute embolism and thrombosis of unspecified deep veins of unspecified lower extremity: Secondary | ICD-10-CM | POA: Diagnosis not present
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
## 2013-10-27 NOTE — Patient Instructions (Signed)
We will try to get records from [facility].  We will do a sleep study. Based on your symptoms and your exam I believe you are at risk for obstructive sleep apnea or OSA, and I think we should proceed with a sleep study to determine whether you do or do not have OSA and how severe it is. If you have more than mild OSA, I want you to consider treatment with CPAP. Please remember, the risks and ramifications of moderate to severe obstructive sleep apnea or OSA are: Cardiovascular disease, including congestive heart failure, stroke, difficult to control hypertension, arrhythmias, and even type 2 diabetes has been linked to untreated OSA. Sleep apnea causes disruption of sleep and sleep deprivation in most cases, which, in turn, can cause recurrent headaches, problems with memory, mood, concentration, focus, and vigilance. Most people with untreated sleep apnea report excessive daytime sleepiness, which can affect their ability to drive. Please do not drive if you feel sleepy.  I will see you back after your sleep study to go over the test results and where to go from there. We will call you after your sleep study and to set up an appointment at the time.   We will refer you for formal cognitive testing.

## 2014-02-20 ENCOUNTER — Ambulatory Visit: Payer: Medicare HMO | Attending: Psychology | Admitting: Psychology

## 2014-02-20 DIAGNOSIS — R413 Other amnesia: Secondary | ICD-10-CM

## 2014-02-20 NOTE — Progress Notes (Addendum)
Initial Contact Note  Name: Chad Olson MRN: [id number] Date: 02/20/2014  Chad Olson is an 69 y.o. right handed male who was referred for neuropsychological evaluation by Star Age, MD of [facility] due to problems with memory loss.   A total of 6 hours was spent today reviewing medical records, interviewing (CPT [phone]) Chad Olson and administering and scoring neurocognitive tests (CPT 96118/[id number]).  There were no concerns expressed or behaviors displayed by Chad Olson that would require immediate attention.   Per his wish, his wife will be contacted by telephone within the next few days to review the results and conclusions from this evaluation.   A full report will follow.   Jamey Ripa, Ph.D 02/20/2014   ________________________________________________________________________________________________________________________  Jamey Ripa, Ph.D [facility]  [address]   Telephone [phone] [address] Fax [phone] [place], [place] [address]   [place]* This report should not be released without the consent of the client  Name:   Chad Olson Date of Birth:  25-Aug-1945 [facility] MR#:  [id number] Dates of Evaluation: 02/20/14        Reason for Referral Chad Olson is a 69 year-old right-handed man who was referred for neuropsychological evaluation by Star Age, MD of [facility]. Chad Olson family members have reported that he has exhibited progressive memory loss over the past eighteen months. It was noted that he has a history of pre-existing short-term memory difficulties subsequent to head trauma he sustained in the 1980s. Within in the past two years he has been hospitalized multiple times for infections and surgeries. Dr. Rexene Alberts noted concern about possible sleep apnea.   Sources of Information Medical Records  available from [facility] records were reviewed. Chad Olson, his wife, Chad Olson, and his sister were interviewed.  History of Illness & Chief Complaints His wife and sister reported that he has become notably more forgetful and with a lowered energy level since 2014, which was concurrent with a several month period during which he was hospitalized multiple times with infections and for bladder cancer surgery. They gave examples of him misplacing personal items, asking the same question repeatedly, not recalling details from recent conversations and forgetting recent events. His wife has noticed that he no longer keeps up with routine household projects, such as changing the home duct filters. He continues to take his medications, do the laundry and drive without incident. His wife and sister stated that his longtime tendencies to be socially disengaged and have few interests have intensified within the past two years. His wife reported that since he retired in 2012 after being diagnosed with bladder cancer, he has spent most of his time alone in his room watching movies. She could not identify any other interests he has ever had. Occasionally, he will go to church if asked. She has wondered whether he has become depressed. Otherwise, she has not noticed any personality or behavioral changes. She has not observed him to have exhibited confusion, delusional thinking, poor judgment or unusual or unsafe behavior.   Chad Olson stated that he did not want to participate in this evaluation because "I am sick of doctors" but agreed at the urging of his wife and sister. He agreed to proceed with this evaluation after the purpose was explained to him. He acknowledged having had "a little bit" of memory difficulty since he suffered a head injury caused by a piece of  metal falling on his head at work in the 1980s. He was not aware of any recent worsening of his memory. He has a  history of hearing loss with use of hearing aids in both ears but has not noticed any recent changes in his hearing. He denied problems with eye-hand coordination, unilateral limb weakness, balance, vision, hearing, sleep, speech, swallowing, appetite, taste or smell. He denied feeling depressed. He stated that he has been content watching movies by himself all day. He denied mood instability, suicidal/homicidal thoughts, hallucinations or delusional ideas.  Background Chad Olson and his current wife have been married for seventeen years. He was previously divorced twice. He has two adult children from his first marriage.  He retired in 2012, which was a few years earlier than expected due to his medical problems. 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  He reported that he was graduated from high school without history of attentional or learning problems.  He has a complicated medical history including hypertension, bladder cancer, sepsis, prostate cancer, gastroesophageal reflux disease, Diabetes Type II and hyperlipidemia. Within in the past two years he has been hospitalized multiple times for infections and surgeries.  His current medications include atorvastatin and pantoprazole.   He reported that he sustained a head injury in the 1980s when a metal pipe fell onto his head. He did not recall whether or not he lost consciousness but he did state he was hospitalized for a few days. He reported that he was able to go back to work a few days later. He reported no other history of head trauma or loss of consciousness, stroke-like symptom, seizures or neurotoxic chemical exposure.   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 He denied use of illicit drugs.  His wife reported that his father showed signs of memory loss when in his eighties.    He reported no history of emotional difficulties or mental health contacts.  Tests  Administered Animal Naming Test  Beck Depression Inventory-II  Beck Anxiety Inventory  [facility] Naming Test Controlled Oral Word Association Test Finger Tapping Test Rey Complex Figure: Copy Trail Making A & B Wechsler Adult Intelligence Scale-IV:        Music therapist, Coding, Digit Span, Matrix Reasoning & Similarities  Wechsler Memory Scale-IV Older Adult Battery Wide Range Achievement Test-4: Word Reading  Assessment Results Observations & Interpretative Considerations He appeared as an appropriately dressed and groomed man in no apparent physical distress. He interacted in a consistently pleasant albeit somewhat detached manner. His affect appeared within a slightly constricted range. He did not display signs of emotional distress. No problems were evident for speech articulation, prosody, word finding, word selection, message coherence or language comprehension. His thought processes were coherent and organized without loose associations, verbal perseverations or flight of ideas. His thought content was devoid of unusual or bizarre ideas.  Test results were considered to be valid. He was cooperative throughout the testing process. He appeared alert and persisted well to task. He did not display signs of emotional or physical distress. He did not report or display problems with hearing (he wore hearing aids in both ears), vision (he wore his eyeglasses) or motor control. He had no difficulty comprehending test instructions. There were no signs of careless, impulsive or perseverative responding. He appeared to expend maximal effort.   His test scores were corrected to reflect norms for his age and, whenever possible, his gender and  educational level (i.e., 12 years). Please see the attached sheet for test scores. His pre-morbid level of general cognitive functioning was estimated to fall within the Low Average range based on his educational/occupational background coupled with a measure of word  reading (Wide Range Achievement Test-4: Word Reading).  Attention & Executive Functions Measures of visual processing speed were just within normal expectations. He performed within the Low Average range on a test of focused attention and visual scanning speed (Trails A) and on a test that required transcription of symbols to match numbers using a key (Wechsler Adult Intelligence Scale-IV (WAIS-IV) Coding). His performance was extremely slowed on a more complex attentional task that required ongoing mental tracking and set shifting between an alternating and ascending number and letter sequence (Trails B).  His ability to temporarily hold and manipulate information auditory information within working memory was within the normal range based on his abilities to repeat digit sequences in reverse or ascending order (WAIS-IV Digit Span). In contrast, he scored within the impaired range on a visual working memory task as he could not consistently recognize two visual symbols in right to left order immediately after seeing them (Wechsler Memory Scale-IV (WMS-IV) Symbol Span).  His ability to fluently generate words to designated letters (Controlled Oral Word Association Test) was within the impaired range.   He performed at a level commensurate with pre-morbid expectations within the Low Average range  on tests of abstract reasoning that required him verbally classify ostensibly different objects or ideas to a shared category (WAIS-IV Similarities) or apply logical nonverbal reasoning to match designs or symbols in an abstract manner (WAIS-IV Matrix Reasoning).  Learning & Memory On the WMS-IV Older Adult Battery his Immediate Memory Index (IMI), a measure of his ability to recall verbal and visual information immediately after the stimuli was presented, fell well within the impaired range at the 1st percentile. He struggled to encode Copywriter, advertising, stories read to him and word pairs. His Delayed Memory  Index (DMI), a combined measure of his ability to recall verbal and visual information after a 20 to 30 minute delay, fell well within the impaired range at below the 1st percentile. His DMI was significantly lower than expected given his IMI, which indicated a very high rate of forgetting of recently learned information. Providing him with cuing on a recognition format did not appreciably aid his delayed recall.   Language His ability to name to confrontation [facility] Naming Test) fell within the Low Average range, which was commensurate with pre-morbid expectations. As noted above, his word (phonemic) fluency was within the impaired range. His word reading skill (Wide Range Achievement Test-4: Word Reading) was within the Low Average range.   Visual-Spatial Organization There were no signs of spatial inattention or problems with visual recognition. He scored at the lower boundary of the Low Average range on a visuospatial organizational task that required assembly of two-dimensional block designs from models Product manager). His copy of a spatially-complex geometric design (Rey Complex Figure) was deficient due to signs of poor perceptual-motor control, fragmentation and disorganization.  Motor He demonstrated consistent right hand preference without signs of tremor or disscoordination. He showed an atypical pattern of slower than expected left hand fine motor speed Finger Tapping) as his right hand fine motor speed was within the High Average range while his left hand fine motor speed was within the Borderline range.  Emotional Functioning On both the Beck Depression Inventory-II and the Beck Anxiety Inventory he did not endorse  any symptoms. This is unusual as even persons who are content and well-adjusted will endorse at least a few symptoms on these questionnaires. His response style was suggestive of denial or lack of psychological insight.  Summary & Conclusions Chad Olson is a 69  year-old man whose family members have reported that he has exhibited progressive memory loss over the past eighteen months. Within in the past two years he has been hospitalized multiple times for infections and surgeries. He has a history of pre-existing short-term memory difficulties subsequent to head trauma he sustained in the 1980s.   Neuropsychological evaluation identified prominent deficits in the areas of memory (both acquisition and storage) and executive functioning (i.e., fluent verbal production, set shifting and spatial planning). His performances on tests of focused attention, visual processing speed, naming to confrontation and abstract reasoning were either commensurate with or slightly below expectations based on his estimated Low Average pre-morbid cognitive level. Finally, his left hand fine motor speed was mildly slowed, which was significantly discrepant from his above average right hand fine motor speed.    With regards to his psychological functioning, he did not report subjective feelings of depression nor did he endorse any symptoms of psychological distress on symptom questionnaires. Given that even persons who are content and well-adjusted tend to endorse at least a few such symptoms, his lack of symptom endorsement suggested underreporting as seen in denial. His wife and sister described him as having become increasingly disengaged from social and recreational activities in recent years. While he explained his behavior by stating that he has always been a loner with a narrow set of interests, it is possible that he is experiencing a depressive disorder.   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 In addition,  his history of diabetes, chronic medical illnesses with multiple recent hospitalizations, sleep difficulties, a possible depressive disorder and a lifestyle lacking much intellectual or social stimulation could also be contributing to cognitive dampening. The only finding that is most likely attributable to his TBI would be the relative slowing of his left hand fine motor speed, which in the absence of peripheral injury would be indicative of anterior right hemisphere dysfunction. In the final analysis, the report from his wife and sister that his cognitive functioning and level of social engagement have notably declined within the past eighteen months raises the possibility of an incipient dementia, a depressive disorder or both.  His prior history of TBI would be a risk factor for developing a primary dementia.   Diagnostic Impressions Cognitive Decline [F09] Rule-Out Depressive Disorder  Recommendations 1. Despite his complete denial of affective distress or psychological problems, his increased degree of social disengagement and narrowing of interests raise the possibility that he is depressed. Depression would be even more likely in context of his recent history of medical illness and hospitalizations. An empirical trial on an antidepressant medication with an activating profile should be considered, if medically appropriate.   2. A repeat neurocognitive evaluation, perhaps in one to two years, using the current test as cognitive baseline, might assist with diagnosis.     I have appreciated the opportunity to evaluate Chad Olson. The results and conclusions from this evaluation were shared with his wife via telephone as per his wishes. Please feel free to contact me with any  comments or questions.    Jamey Ripa, Ph.D Licensed Psychologist    ADDENDUM-NEUROPSYCHOLOGICAL TEST RESULTS                      Name:   Chad Olson Date of Birth:  [birth year] [facility] MR#:  [id number] Date  of Evaluation: 02/20/14                      Animal Naming Test Score= 10     1st (adjusted for age, gender and educational level)   [place] Naming Test Score= [phone]     11th (adjusted for age, gender and educational level)   Controlled Oral Word Association Test 14 words/0 repetition     1st (adjusted for age, gender and educational level)   Finger Tapping R: 19     76th (adjusted for age, gender and educational level) L: 17       8th  (adjusted for age, gender and educational level)  Rey Complex Figure: copy   impaired due to signs of poor perceptual-motor control, fragmentation and disorganization   Trails A       51s 0e     16th (adjusted for age, gender and educational level) Trails B         230s 1e      2nd  (adjusted for age, gender and educational level)   Wechsler Adult Intelligence Scale-IV (WAIS-IV)      scaled score percentile  Block Design   6         9th    Similarities   6          9th     Digit Span   7       16th    Matrix Reasoning  7         16th   Coding    6         9th      Wechsler Memory Scale-IV (WMS-IV) Older Adult Battery  Index                         Index Score     Percentile            Immediate Memory    69     1st                   Auditory Memory    62     1st                                 Visual Memory    66       1st                             Delayed Memory     54   <1st   Symbol Span         2nd     Wide Range Achievement Test-4  Subtest             raw score       standard score  %rank  Word Reading    49/70    86     18th

## 2014-04-02 ENCOUNTER — Other Ambulatory Visit (HOSPITAL_COMMUNITY): Payer: Self-pay | Admitting: Urology

## 2014-04-02 ENCOUNTER — Ambulatory Visit (HOSPITAL_COMMUNITY)
Admission: RE | Admit: 2014-04-02 | Discharge: 2014-04-02 | Disposition: A | Discharge status: Home / Self Care | Payer: Medicare HMO | Source: Ambulatory Visit | Attending: Urology | Admitting: Urology

## 2014-04-02 DIAGNOSIS — C670 Malignant neoplasm of trigone of bladder: Secondary | ICD-10-CM | POA: Insufficient documentation

## 2014-06-17 IMAGING — CR DG ABDOMEN 2V
5 series · 5 of 5 positions shown · non-contrast
Comparison: 06/28/2012.

CLINICAL DATA: Distended abdomen with nausea and vomiting.

ABDOMEN - 2 VIEW

[w abdomen decub * (1 of 2)]
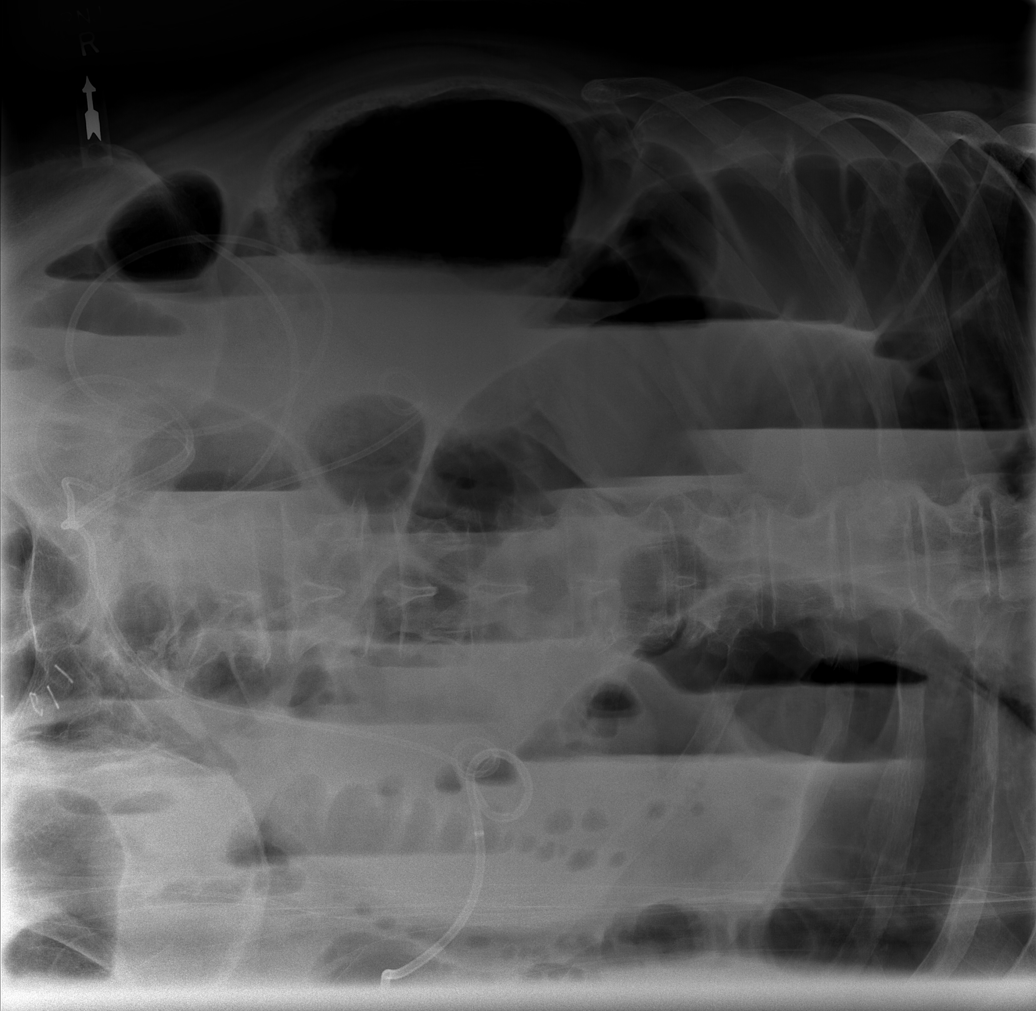

[w abdomen decub * (2 of 2)]
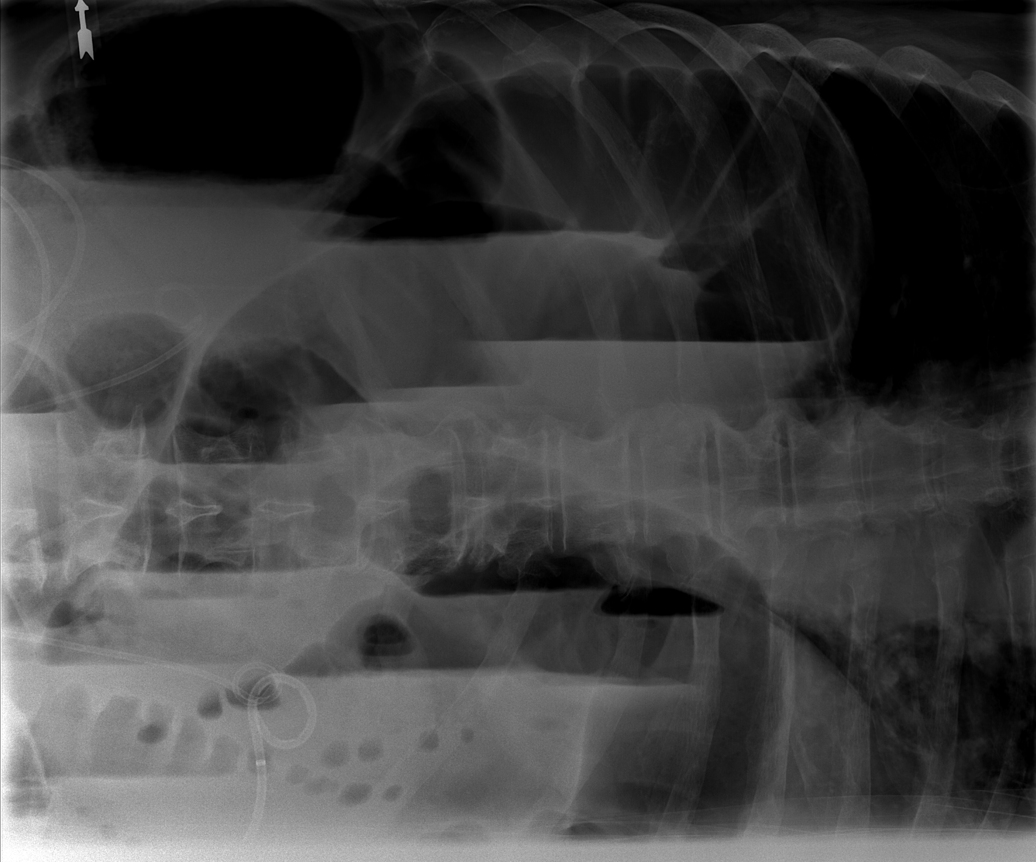

[t abdomen supine (1 of 3)]
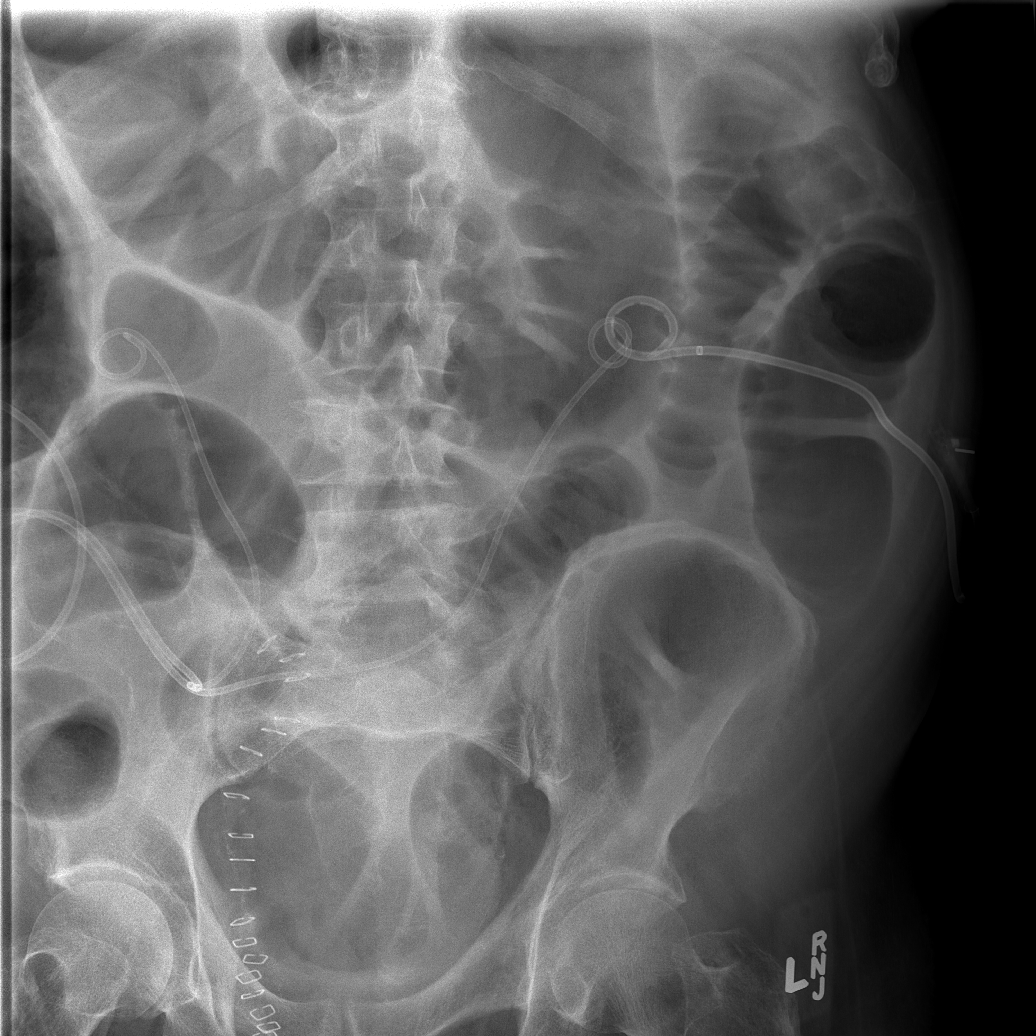

[t abdomen supine (2 of 3)]
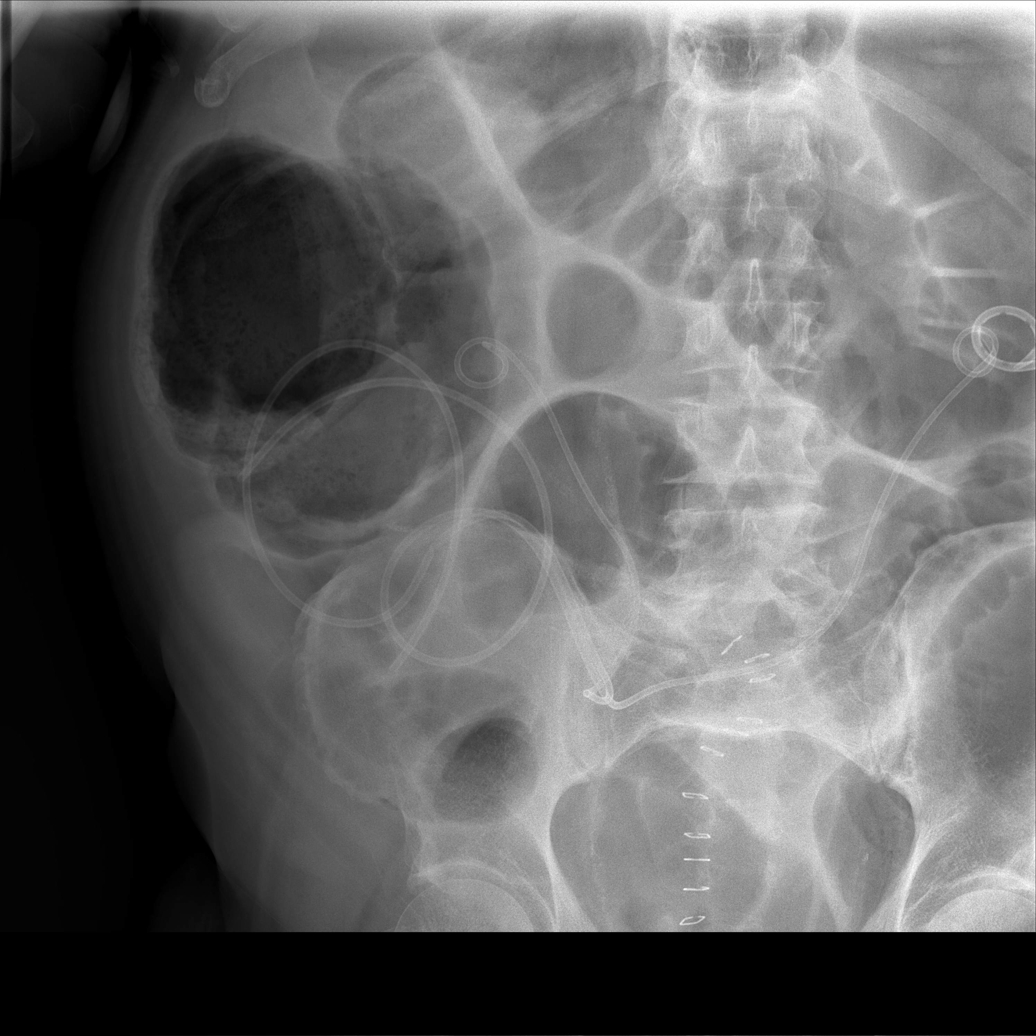

[t abdomen supine (3 of 3)]
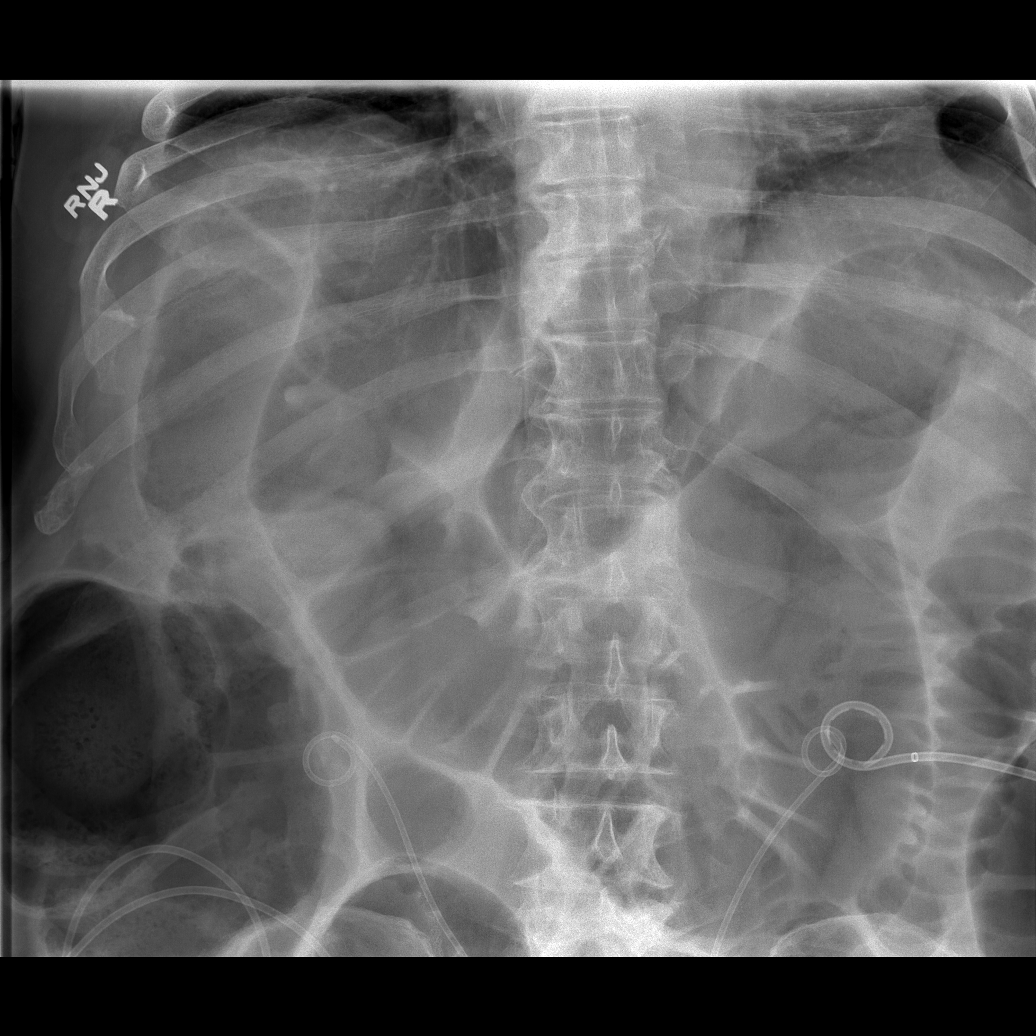

[5 of 5 positions shown; findings below may reference images not displayed]

FINDINGS: Marked gaseous distention of small bowel and colon with
associated air fluid levels.  Bilateral ureteral stents are in
place with a presumed right lower quadrant ileal conduit.
Percutaneous left nephrostomy as well.
IMPRESSION: Bowel gas pattern is indicative of an ileus.

## 2014-06-27 IMAGING — CR DG ABDOMEN 1V
1 series · 1 of 1 positions shown · non-contrast
Comparison: Abdominal radiographs 07/11/2012.

CLINICAL DATA: Rectal tube placement.

ABDOMEN - 1 VIEW

[ap (kub)]
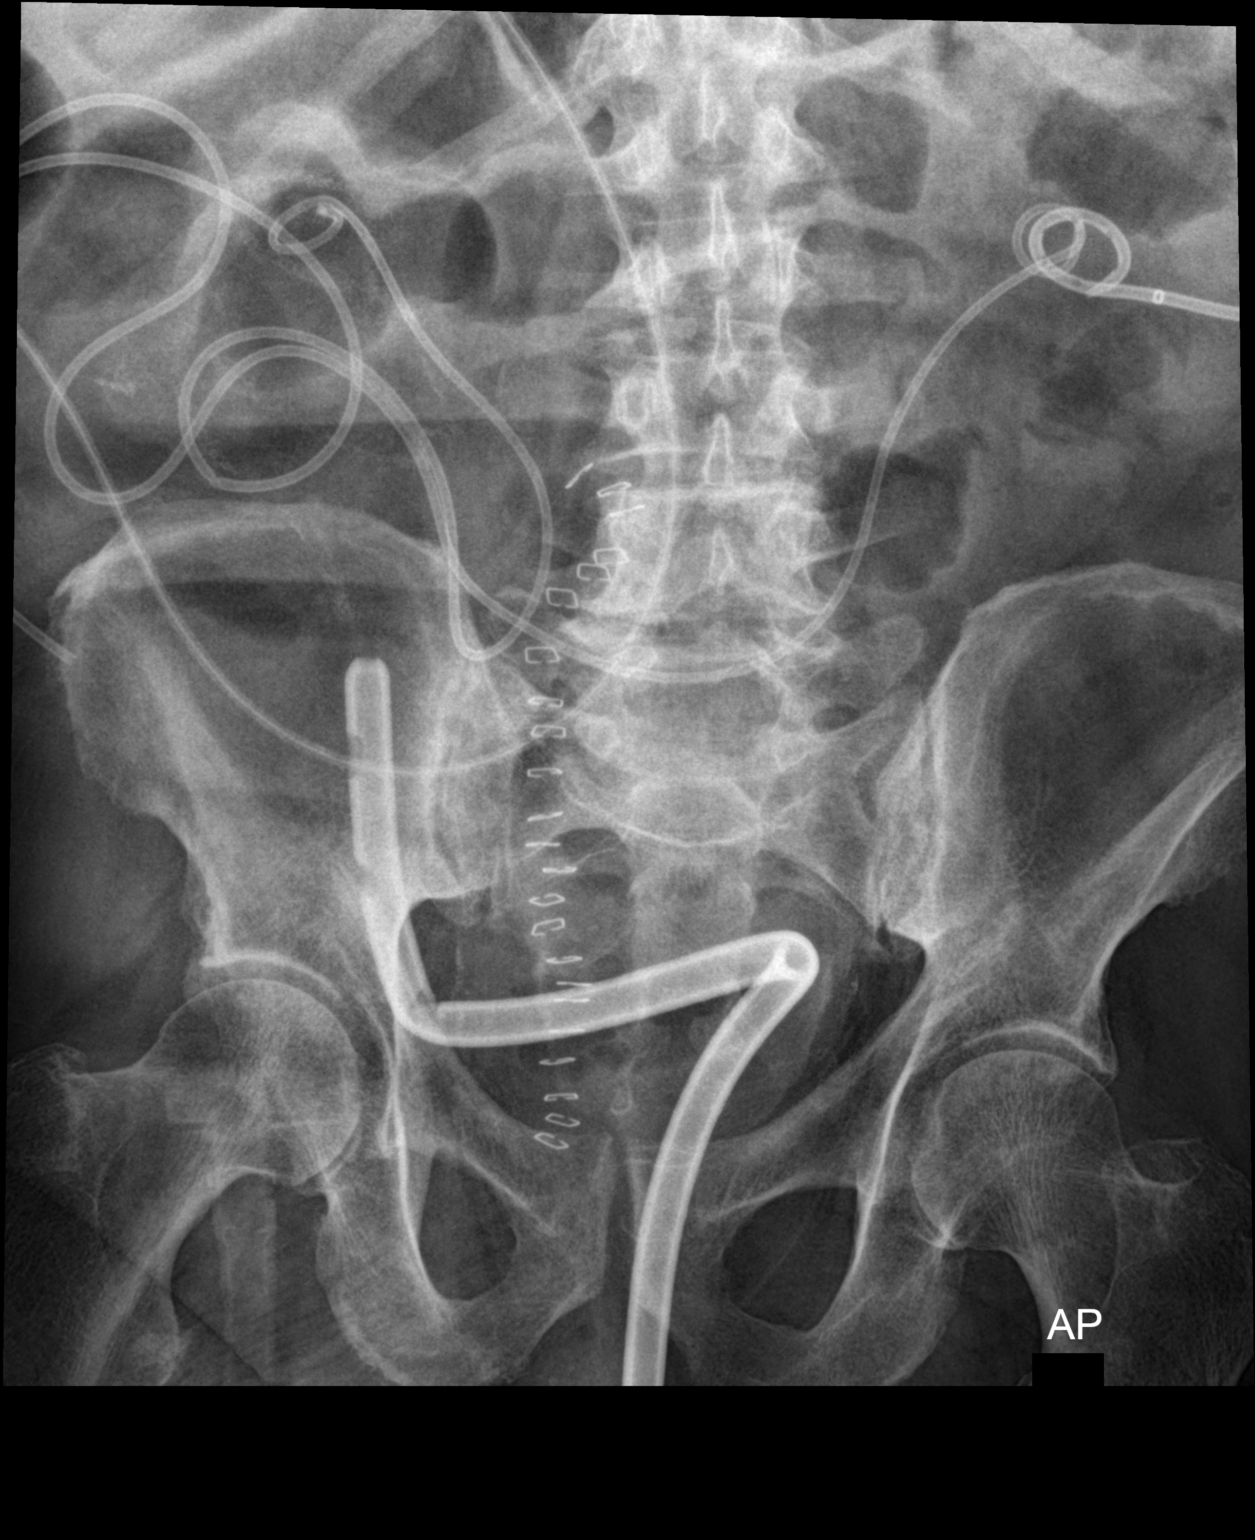

[1 of 1 positions shown; findings below may reference images not displayed]

FINDINGS: A rectal tube is now in place.  There is some
decompression of the bowel.  Bilateral ureteral stents are in
place.  The left nephrostomy tube is noted.
IMPRESSION: 1.  Interval placement of a rectal tube with partial decompression
of the bowel.

## 2014-06-29 ENCOUNTER — Other Ambulatory Visit: Payer: Self-pay

## 2014-07-17 ENCOUNTER — Encounter: Payer: Self-pay | Admitting: *Deleted

## 2014-07-23 ENCOUNTER — Encounter: Payer: Self-pay | Admitting: Internal Medicine

## 2014-12-15 ENCOUNTER — Encounter (HOSPITAL_COMMUNITY): Payer: Self-pay | Admitting: Emergency Medicine

## 2014-12-15 ENCOUNTER — Observation Stay (HOSPITAL_COMMUNITY): Payer: Medicare HMO

## 2014-12-15 ENCOUNTER — Inpatient Hospital Stay (HOSPITAL_COMMUNITY)
Admission: EM | Admit: 2014-12-15 | Discharge: 2014-12-17 | DRG: 872 | Disposition: A | Discharge status: Home / Self Care | Payer: Medicare HMO | Attending: Urology | Admitting: Urology

## 2014-12-15 ENCOUNTER — Emergency Department (HOSPITAL_COMMUNITY): Payer: Medicare HMO

## 2014-12-15 DIAGNOSIS — N136 Pyonephrosis: Secondary | ICD-10-CM | POA: Diagnosis present

## 2014-12-15 DIAGNOSIS — E1165 Type 2 diabetes mellitus with hyperglycemia: Secondary | ICD-10-CM | POA: Diagnosis present

## 2014-12-15 DIAGNOSIS — M1990 Unspecified osteoarthritis, unspecified site: Secondary | ICD-10-CM | POA: Diagnosis present

## 2014-12-15 DIAGNOSIS — H9190 Unspecified hearing loss, unspecified ear: Secondary | ICD-10-CM | POA: Diagnosis present

## 2014-12-15 DIAGNOSIS — Z85828 Personal history of other malignant neoplasm of skin: Secondary | ICD-10-CM

## 2014-12-15 DIAGNOSIS — Z86711 Personal history of pulmonary embolism: Secondary | ICD-10-CM

## 2014-12-15 DIAGNOSIS — C679 Malignant neoplasm of bladder, unspecified: Secondary | ICD-10-CM | POA: Diagnosis present

## 2014-12-15 DIAGNOSIS — B9620 Unspecified Escherichia coli [E. coli] as the cause of diseases classified elsewhere: Secondary | ICD-10-CM | POA: Diagnosis present

## 2014-12-15 DIAGNOSIS — Z936 Other artificial openings of urinary tract status: Secondary | ICD-10-CM

## 2014-12-15 DIAGNOSIS — Z96652 Presence of left artificial knee joint: Secondary | ICD-10-CM | POA: Diagnosis present

## 2014-12-15 DIAGNOSIS — Z79899 Other long term (current) drug therapy: Secondary | ICD-10-CM

## 2014-12-15 DIAGNOSIS — E872 Acidosis: Secondary | ICD-10-CM | POA: Diagnosis present

## 2014-12-15 DIAGNOSIS — Z801 Family history of malignant neoplasm of trachea, bronchus and lung: Secondary | ICD-10-CM

## 2014-12-15 DIAGNOSIS — Z8249 Family history of ischemic heart disease and other diseases of the circulatory system: Secondary | ICD-10-CM

## 2014-12-15 DIAGNOSIS — K649 Unspecified hemorrhoids: Secondary | ICD-10-CM | POA: Diagnosis present

## 2014-12-15 DIAGNOSIS — Z906 Acquired absence of other parts of urinary tract: Secondary | ICD-10-CM

## 2014-12-15 DIAGNOSIS — E785 Hyperlipidemia, unspecified: Secondary | ICD-10-CM | POA: Diagnosis present

## 2014-12-15 DIAGNOSIS — N1330 Unspecified hydronephrosis: Secondary | ICD-10-CM | POA: Insufficient documentation

## 2014-12-15 DIAGNOSIS — N132 Hydronephrosis with renal and ureteral calculous obstruction: Secondary | ICD-10-CM | POA: Diagnosis present

## 2014-12-15 DIAGNOSIS — Z8551 Personal history of malignant neoplasm of bladder: Secondary | ICD-10-CM

## 2014-12-15 DIAGNOSIS — R000 Tachycardia, unspecified: Secondary | ICD-10-CM

## 2014-12-15 DIAGNOSIS — Z86718 Personal history of other venous thrombosis and embolism: Secondary | ICD-10-CM

## 2014-12-15 DIAGNOSIS — Z9079 Acquired absence of other genital organ(s): Secondary | ICD-10-CM

## 2014-12-15 DIAGNOSIS — N139 Obstructive and reflux uropathy, unspecified: Secondary | ICD-10-CM

## 2014-12-15 DIAGNOSIS — Z791 Long term (current) use of non-steroidal anti-inflammatories (NSAID): Secondary | ICD-10-CM

## 2014-12-15 DIAGNOSIS — K219 Gastro-esophageal reflux disease without esophagitis: Secondary | ICD-10-CM | POA: Diagnosis present

## 2014-12-15 DIAGNOSIS — N179 Acute kidney failure, unspecified: Secondary | ICD-10-CM | POA: Diagnosis present

## 2014-12-15 DIAGNOSIS — A419 Sepsis, unspecified organism: Principal | ICD-10-CM | POA: Diagnosis present

## 2014-12-15 DIAGNOSIS — D090 Carcinoma in situ of bladder: Secondary | ICD-10-CM | POA: Diagnosis present

## 2014-12-15 DIAGNOSIS — N201 Calculus of ureter: Secondary | ICD-10-CM | POA: Diagnosis not present

## 2014-12-15 DIAGNOSIS — R109 Unspecified abdominal pain: Secondary | ICD-10-CM | POA: Diagnosis not present

## 2014-12-15 DIAGNOSIS — I10 Essential (primary) hypertension: Secondary | ICD-10-CM | POA: Diagnosis present

## 2014-12-15 DIAGNOSIS — Z8546 Personal history of malignant neoplasm of prostate: Secondary | ICD-10-CM

## 2014-12-15 LAB — COMPREHENSIVE METABOLIC PANEL
ALBUMIN: 4.1 g/dL (ref 3.5–5.0)
ALT: 18 U/L (ref 17–63)
ANION GAP: 7 (ref 5–15)
AST: 21 U/L (ref 15–41)
Alkaline Phosphatase: 76 U/L (ref 38–126)
BILIRUBIN TOTAL: 1 mg/dL (ref 0.3–1.2)
BUN: 28 mg/dL — ABNORMAL HIGH (ref 6–20)
CO2: 25 mmol/L (ref 22–32)
Calcium: 9.3 mg/dL (ref 8.9–10.3)
Chloride: 111 mmol/L (ref 101–111)
Creatinine, Ser: 1.67 mg/dL — ABNORMAL HIGH (ref 0.61–1.24)
GFR calc Af Amer: 47 mL/min — ABNORMAL LOW (ref >60)
GFR, EST NON AFRICAN AMERICAN: 40 mL/min — AB (ref >60)
Glucose, Bld: 159 mg/dL — ABNORMAL HIGH (ref 65–99)
POTASSIUM: 4.2 mmol/L (ref 3.5–5.1)
Sodium: 143 mmol/L (ref 135–145)
TOTAL PROTEIN: 7.8 g/dL (ref 6.5–8.1)

## 2014-12-15 LAB — CBC
HCT: 51.5 % (ref 39.0–52.0)
Hemoglobin: 16.7 g/dL (ref 13.0–17.0)
MCH: 30.3 pg (ref 26.0–34.0)
MCHC: 32.4 g/dL (ref 30.0–36.0)
MCV: 93.5 fL (ref 78.0–100.0)
PLATELETS: 182 10*3/uL (ref 150–400)
RBC: 5.51 MIL/uL (ref 4.22–5.81)
RDW: 15.3 % (ref 11.5–15.5)
WBC: 9.5 10*3/uL (ref 4.0–10.5)

## 2014-12-15 LAB — URINALYSIS, ROUTINE W REFLEX MICROSCOPIC
BILIRUBIN URINE: NEGATIVE
GLUCOSE, UA: 250 mg/dL — AB
KETONES UR: NEGATIVE mg/dL
NITRITE: POSITIVE — AB
PH: 7 (ref 5.0–8.0)
Protein, ur: 100 mg/dL — AB
SPECIFIC GRAVITY, URINE: 1.016 (ref 1.005–1.030)

## 2014-12-15 LAB — LIPASE, BLOOD: LIPASE: 27 U/L (ref 11–51)

## 2014-12-15 LAB — BASIC METABOLIC PANEL
ANION GAP: 5 (ref 5–15)
BUN: 24 mg/dL — ABNORMAL HIGH (ref 6–20)
CALCIUM: 8.4 mg/dL — AB (ref 8.9–10.3)
CHLORIDE: 114 mmol/L — AB (ref 101–111)
CO2: 21 mmol/L — AB (ref 22–32)
Creatinine, Ser: 1.68 mg/dL — ABNORMAL HIGH (ref 0.61–1.24)
GFR calc non Af Amer: 40 mL/min — ABNORMAL LOW (ref >60)
GFR, EST AFRICAN AMERICAN: 47 mL/min — AB (ref >60)
Glucose, Bld: 172 mg/dL — ABNORMAL HIGH (ref 65–99)
POTASSIUM: 4.4 mmol/L (ref 3.5–5.1)
Sodium: 140 mmol/L (ref 135–145)

## 2014-12-15 LAB — URINE MICROSCOPIC-ADD ON

## 2014-12-15 LAB — PROTIME-INR
INR: 1.08 (ref 0.00–1.49)
Prothrombin Time: 14.2 seconds (ref 11.6–15.2)

## 2014-12-15 LAB — APTT: aPTT: 31 seconds (ref 24–37)

## 2014-12-15 LAB — MRSA PCR SCREENING: MRSA by PCR: NEGATIVE

## 2014-12-15 LAB — I-STAT CG4 LACTIC ACID, ED
Lactic Acid, Venous: 2.34 mmol/L (ref 0.5–2.0)
Lactic Acid, Venous: 1.77 mmol/L (ref 0.5–2.0)

## 2014-12-15 MED ORDER — SODIUM CHLORIDE 0.9 % IV SOLN
1000.0000 mL | INTRAVENOUS | Status: DC
  Administered 2014-12-15: 1000 mL via INTRAVENOUS

## 2014-12-15 MED ORDER — SENNOSIDES-DOCUSATE SODIUM 8.6-50 MG PO TABS: 1.0000 | ORAL_TABLET | Freq: Every evening | ORAL | Status: DC | PRN

## 2014-12-15 MED ORDER — CEFTRIAXONE SODIUM 1 G IJ SOLR
1.0000 g | Freq: Once | INTRAMUSCULAR | Status: AC
  Administered 2014-12-15: 1 g via INTRAVENOUS
  Filled 2014-12-15: qty 10

## 2014-12-15 MED ORDER — SACCHAROMYCES BOULARDII 250 MG PO CAPS
250.0000 mg | ORAL_CAPSULE | Freq: Two times a day (BID) | ORAL | Status: DC
  Administered 2014-12-15 – 2014-12-17 (×5): 250 mg via ORAL
  Filled 2014-12-15 (×6): qty 1

## 2014-12-15 MED ORDER — PROMETHAZINE HCL 25 MG/ML IJ SOLN
12.5000 mg | Freq: Once | INTRAMUSCULAR | Status: AC
  Administered 2014-12-15: 12.5 mg via INTRAVENOUS
  Filled 2014-12-15: qty 1

## 2014-12-15 MED ORDER — DIPHENHYDRAMINE HCL 12.5 MG/5ML PO ELIX: 12.5000 mg | ORAL_SOLUTION | Freq: Four times a day (QID) | ORAL | Status: DC | PRN

## 2014-12-15 MED ORDER — GENTAMICIN SULFATE 40 MG/ML IJ SOLN: 2.0000 mg/kg | INTRAVENOUS | Status: DC

## 2014-12-15 MED ORDER — MIDAZOLAM HCL 2 MG/2ML IJ SOLN
INTRAMUSCULAR | Status: AC
Start: 2014-12-15 — End: 2014-12-15
  Filled 2014-12-15: qty 8

## 2014-12-15 MED ORDER — MIDAZOLAM HCL 2 MG/2ML IJ SOLN
INTRAMUSCULAR | Status: AC | PRN
  Administered 2014-12-15 (×2): 1 mg via INTRAVENOUS
  Administered 2014-12-15: 0.5 mg via INTRAVENOUS
  Administered 2014-12-15: 1 mg via INTRAVENOUS
  Administered 2014-12-15: 0.5 mg via INTRAVENOUS

## 2014-12-15 MED ORDER — DEXTROSE 5 % IV SOLN
2.0000 g | INTRAVENOUS | Status: DC
  Administered 2014-12-16 – 2014-12-17 (×2): 2 g via INTRAVENOUS
  Filled 2014-12-15 (×3): qty 2

## 2014-12-15 MED ORDER — HEPARIN SODIUM (PORCINE) 5000 UNIT/ML IJ SOLN
5000.0000 [IU] | Freq: Three times a day (TID) | INTRAMUSCULAR | Status: DC
  Administered 2014-12-15 – 2014-12-17 (×7): 5000 [IU] via SUBCUTANEOUS
  Filled 2014-12-15 (×9): qty 1

## 2014-12-15 MED ORDER — FENTANYL CITRATE (PF) 100 MCG/2ML IJ SOLN
INTRAMUSCULAR | Status: AC | PRN
  Administered 2014-12-15: 50 ug via INTRAVENOUS

## 2014-12-15 MED ORDER — FENTANYL CITRATE (PF) 100 MCG/2ML IJ SOLN
INTRAMUSCULAR | Status: AC
  Filled 2014-12-15: qty 4

## 2014-12-15 MED ORDER — GENTAMICIN SULFATE 40 MG/ML IJ SOLN
7.0000 mg/kg | INTRAMUSCULAR | Status: AC
  Administered 2014-12-15: 620 mg via INTRAVENOUS
  Filled 2014-12-15: qty 15.5

## 2014-12-15 MED ORDER — ONDANSETRON HCL 4 MG/2ML IJ SOLN: 4.0000 mg | INTRAMUSCULAR | Status: DC | PRN

## 2014-12-15 MED ORDER — OXYCODONE HCL 5 MG PO TABS: 5.0000 mg | ORAL_TABLET | ORAL | Status: DC | PRN

## 2014-12-15 MED ORDER — DEXTROSE 5 % IV SOLN
1.0000 g | Freq: Once | INTRAVENOUS | Status: AC
  Administered 2014-12-15: 1 g via INTRAVENOUS
  Filled 2014-12-15: qty 10

## 2014-12-15 MED ORDER — IOHEXOL 300 MG/ML  SOLN
50.0000 mL | Freq: Once | INTRAMUSCULAR | Status: AC | PRN
  Administered 2014-12-15: 3 mL

## 2014-12-15 MED ORDER — HYDROMORPHONE HCL 1 MG/ML IJ SOLN: 0.5000 mg | INTRAMUSCULAR | Status: DC | PRN

## 2014-12-15 MED ORDER — BISACODYL 10 MG RE SUPP: 10.0000 mg | Freq: Every day | RECTAL | Status: DC | PRN

## 2014-12-15 MED ORDER — LIDOCAINE HCL 1 % IJ SOLN
INTRAMUSCULAR | Status: AC
Start: 2014-12-15 — End: 2014-12-15
  Filled 2014-12-15: qty 20

## 2014-12-15 MED ORDER — FLEET ENEMA 7-19 GM/118ML RE ENEM: 1.0000 | ENEMA | Freq: Once | RECTAL | Status: DC | PRN

## 2014-12-15 MED ORDER — FENTANYL CITRATE (PF) 100 MCG/2ML IJ SOLN
100.0000 ug | Freq: Once | INTRAMUSCULAR | Status: AC
  Administered 2014-12-15: 100 ug via INTRAVENOUS
  Filled 2014-12-15: qty 2

## 2014-12-15 MED ORDER — ACETAMINOPHEN 325 MG PO TABS: 650.0000 mg | ORAL_TABLET | ORAL | Status: DC | PRN

## 2014-12-15 MED ORDER — POTASSIUM CHLORIDE IN NACL 20-0.45 MEQ/L-% IV SOLN
INTRAVENOUS | Status: DC
  Administered 2014-12-15 – 2014-12-17 (×5): via INTRAVENOUS
  Filled 2014-12-15 (×7): qty 1000

## 2014-12-15 MED ORDER — ZOLPIDEM TARTRATE 5 MG PO TABS: 5.0000 mg | ORAL_TABLET | Freq: Every evening | ORAL | Status: DC | PRN

## 2014-12-15 MED ORDER — OXYCODONE HCL 5 MG PO TABS
5.0000 mg | ORAL_TABLET | Freq: Four times a day (QID) | ORAL | Status: DC | PRN
  Administered 2014-12-15: 5 mg via ORAL
  Filled 2014-12-15: qty 1

## 2014-12-15 MED ORDER — FENTANYL CITRATE (PF) 100 MCG/2ML IJ SOLN: 25.0000 ug | INTRAMUSCULAR | Status: DC | PRN

## 2014-12-15 MED ORDER — DIPHENHYDRAMINE HCL 50 MG/ML IJ SOLN: 12.5000 mg | Freq: Four times a day (QID) | INTRAMUSCULAR | Status: DC | PRN

## 2014-12-15 MED ORDER — ONDANSETRON HCL 4 MG/2ML IJ SOLN
4.0000 mg | Freq: Once | INTRAMUSCULAR | Status: AC
  Administered 2014-12-15: 4 mg via INTRAVENOUS
  Filled 2014-12-15: qty 2

## 2014-12-15 MED ORDER — ACETAMINOPHEN 650 MG RE SUPP
650.0000 mg | Freq: Once | RECTAL | Status: AC
  Administered 2014-12-15: 650 mg via RECTAL
  Filled 2014-12-15: qty 1

## 2014-12-15 NOTE — ED Notes (Signed)
Pt currently vomiting into trash can.  RN notified.

## 2014-12-15 NOTE — Procedures (Signed)
Placement of right percutaneous nephrostomy tube.  Aspirated 40 ml of bloody purulent fluid from right kidney.  Minimal blood loss and no immediate complication.
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
## 2014-12-15 NOTE — ED Notes (Signed)
Pt states he has a pain in his right lower back that wraps around his hip bone toward the front  Pt states the pain started about 2 hrs ago  Pt states the pain is dull  Denies N/V  Pt has a urostomy on the right side

## 2014-12-15 NOTE — Progress Notes (Signed)
Patient ID: Chad Olson, male   DOB: 1945-12-07, 69 y.o.   MRN: [id number] Since I saw him earlier this morning he has started exhibiting signs of sepsis of urinary origin with a fever to 102 and an elevated lactate.  I am going to add Gentamycin per pharmacy to the Rocephin.  He will be admitted to step down.  IR will expedite tube placement.

## 2014-12-15 NOTE — Progress Notes (Signed)
Urology Progress Note : phone call from [facility] ED:  Pt nbow has become come confused. Admitted by Drl. Wrenn . Found to have stone in Right ureter.  CT films reviewed: 2 stones in Right ureter with severe obstruction. Pt appears to becoming septic. Will put in [facility] stepdown and ask for ICU consult.   Subjective:     No acute urologic events overnight.   Pain:   Objective:  Blood pressure 141/91, pulse 113, temperature 102 F (38.9 C), temperature source Rectal, resp. rate 31, SpO2 94 %.  Physical Exam:  General:  No acute distress, awake  Genitourinary:   Foley:       Recent Labs     12/15/14  0231  HGB  16.7  WBC  9.5  PLT  182    Recent Labs     12/15/14  0231  NA  143  K  4.2  CL  111  CO2  25  BUN  28*  CREATININE  1.67*  CALCIUM  9.3  GFRNONAA  40*  GFRAA  47*     No results for input(s): INR, APTT in the last 72 hours.  Invalid input(s): PT   Invalid input(s): ABG CLINICAL DATA: Acute onset of right lower back pain, extending to the right lower quadrant. Initial encounter.  EXAM: CT ABDOMEN AND PELVIS WITHOUT CONTRAST  TECHNIQUE: Multidetector CT imaging of the abdomen and pelvis was performed following the standard protocol without IV contrast.  COMPARISON: CT of the abdomen and pelvis performed 04/02/2014  FINDINGS: The visualized lung bases are clear.  The liver and spleen are unremarkable in appearance. Layering high attenuation within the gallbladder likely reflects numerous tiny stones. The gallbladder is otherwise unremarkable. The pancreas and adrenal glands are unremarkable.  There is severe right-sided hydronephrosis, with diffuse right-sided perinephric stranding and fluid, and diffuse dilatation of the right ureter to the level of a large 1.3 x 1.0 cm obstructing stone at the distal right ureter, approximately 2 cm above the patient's ileal conduit. An adjacent smaller 5 mm stone is noted more proximally within the  right ureter.  A 1.8 cm cyst is noted at the interpole region of the left kidney. Mild nonspecific left-sided perinephric stranding is seen. A 4 mm nonobstructing stone is noted at the interpole region of the right kidney. A 2.1 cm right renal cyst is noted. A few stones are noted within the ileal conduit, likely reflecting previously passed stones. The right lower quadrant urostomy is grossly unremarkable.  No free fluid is identified. The small bowel is unremarkable in appearance. The stomach is within normal limits. No acute vascular abnormalities are seen.  The appendix is normal in caliber, without evidence of appendicitis. Mild diverticulosis is noted along the ascending and sigmoid colon, without evidence of diverticulitis. The colon is otherwise unremarkable.  The patient is status post cystectomy and prostatectomy. No inguinal lymphadenopathy is seen.  No acute osseous abnormalities are identified. Chronic bilateral pars defects are seen at L5, without evidence of anterolisthesis. Vacuum phenomenon is noted at L4-L5.  IMPRESSION: 1. Severe right-sided hydronephrosis, with diffuse right-sided perinephric stranding and fluid, and diffuse dilatation of the right ureter. Obstructing large 1.3 x 1.0 cm stone noted in the distal right ureter, approximately 2 cm above the patient's ileal conduit. Adjacent smaller 5 mm stone noted more proximally within the right ureter. 2. 4 mm nonobstructing stone at the interpole region of the right kidney. Small bilateral renal cysts seen. 3. Few stones within the ileal conduit  likely reflect previously passed stones. Right lower quadrant urostomy is grossly unremarkable. 4. Cholelithiasis. Gallbladder otherwise unremarkable. 5. Mild diverticulosis along the ascending and sigmoid colon, without evidence of diverticulitis fifth 6. Chronic bilateral pars defects at L5, without evidence of anterolisthesis.   Electronically  Signed  By: Garald Balding M.D.  On: 12/15/2014 04:19         Assessment/Plan:  Right ureteral obstruction from 2 stones, in ileal loop. Pt is now becoming septic, with confusion. P: 1. Contact IR for immediate Right perc nephrostomy 2. ICU consult.
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
## 2014-12-15 NOTE — ED Notes (Signed)
Bed: [id number] Expected date:  Expected time:  Means of arrival:  Comments: [place], in IR, to return

## 2014-12-15 NOTE — ED Notes (Signed)
Pt back from IR 

## 2014-12-15 NOTE — ED Notes (Signed)
Pt to IR
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
## 2014-12-15 NOTE — ED Notes (Signed)
Abnormal lab result MD Campos have been info

## 2014-12-15 NOTE — Consult Note (Signed)
PULMONARY / CRITICAL CARE MEDICINE   Name: Chad Olson MRN: [id number] DOB: 08/29/1945    ADMISSION DATE:  12/15/2014 CONSULTATION DATE:  12/15/14  REFERRING MD:  Dr. 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 Hospital course at that time was complicated by DVT/PE (completed approximately 6 month anticoagulation) pelvic hematoma, prior bilateral nephrostomies, and SIBO (treated at [facility] for small intestine bowel overgrowth) who presented to [facility] on 12/13 with acute onset right flank pain.  The patient was in his usual state of health on 12/12.  He woke around midnight with acute onset right flank pain, nausea that progressed to vomiting. The patient denied hematuria.  Initial evaluation in the ER noted the patient to be febrile with temperature up to 102. CT of the abdomen was assessed and revealed severe right-sided hydronephrosis with right-sided perinephric stranding, fluid, diffuse dilation of the right ureter, large obstructing 1.3 x 1.0 cm stone in the right distal ureter approximate 2 cm above the patient's ileal conduit.  CT also noted adjacent smaller 5 mm stone proximally in the right ureter and a 4 mm nonobstructing stone at the interpole region of the right kidney. The patient was admitted by urology. Interventional radiology was consulted for evaluation for urgent placement of percutaneous nephrostomy tube. The patient was treated with gentamicin, rocephin and gentle fluid resuscitation.  Initial labs - UA  concerning for UTI, NA 143, K4.2, creatinine 1.67 (baseline creatinine approximately 1.1), glucose 159, lactic acid 2.34, WBC 9.5, Hgb 16.7 and platelets 182.  The patient remained hemodynamically stable.  PCCM consulted for sepsis evaluation.     PAST MEDICAL HISTORY :  He  has a past medical history of Hypertension; Hyperlipemia; Impaired hearing (BILATERAL AIDS); History of concussion (1992); Acid reflux (WATCHES DIET); Diet-controlled type 2 diabetes mellitus ([place]); Arthritis; Hemorrhoids; Carcinoma in situ of bladder (RECURRENT); Bilateral leg pain; History of basal cell carcinoma excision (BASE OF LEFT EAR); Erythrocytosis (LABS STABLE); Cancer [facility]) (Feb 2014); Pelvic hematoma, male (06/26/2012); Blood transfusion without reported diagnosis; Clotting disorder ([place]); and DVT (deep venous thrombosis) ([place]).  PAST SURGICAL HISTORY: He  has past surgical history that includes Transurethral resection of bladder tumor (01-11-2010); Knee arthroscopy (2009); Total knee arthroplasty (2009); Cystoscopy with biopsy (01/09/2011); Cystoscopy with biopsy (07/12/2011); Cystoscopy w/ retrogrades (07/12/2011); Cystoscopy w/ retrogrades (12/21/2011); Cystoscopy with biopsy (12/21/2011); Robot assisted laparoscopic complete cystect ileal conduit (N/A, 02/22/2012); Lymphadenectomy (Bilateral, 02/22/2012); Cystoscopy (N/A, 02/22/2012); Ureteral reimplantion (Bilateral, 06/14/2012); Esophagogastroduodenoscopy (N/A, 07/02/2012); Flexible sigmoidoscopy (N/A, 07/12/2012); Flexible sigmoidoscopy (N/A, 07/24/2012); and Hernia repair (2015).  No Known Allergies  No current facility-administered medications on file prior to encounter.   Current Outpatient Prescriptions on File Prior to Encounter  Medication Sig  . atorvastatin (LIPITOR) 40 MG tablet Take 40 mg by mouth.  . pantoprazole (PROTONIX) 40 MG tablet Take 40 mg by mouth daily.     FAMILY HISTORY:  His indicated that his mother is deceased. He indicated that his father is  deceased.   SOCIAL HISTORY: He  reports that he has never smoked. He has never used smokeless tobacco. He reports that he does not drink alcohol or use illicit  drugs.  REVIEW OF SYSTEMS:   Gen: Denies fever, chills, weight change, fatigue, night sweats HEENT: Denies blurred vision, double vision, hearing loss, tinnitus, sinus congestion, rhinorrhea, sore throat, neck stiffness, dysphagia PULM: Denies shortness of breath, cough, sputum production, hemoptysis, wheezing CV: Denies chest pain, edema, orthopnea, paroxysmal nocturnal dyspnea, palpitations GI: Denies abdominal pain, diarrhea, hematochezia, melena, constipation, change in bowel habits.  Reports acute onset right sided flank pain, nausea and vomiting.  GU: Denies dysuria, hematuria, polyuria, oliguria, urethral discharge Endocrine: Denies hot or cold intolerance, polyuria, polyphagia or appetite change Derm: Denies rash, dry skin, scaling or peeling skin change Heme: Denies easy bruising, bleeding, bleeding gums Neuro: Denies headache, numbness, weakness, slurred speech, loss of memory or consciousness   SUBJECTIVE:    VITAL SIGNS: BP 111/79 mmHg  Pulse 115  Temp(Src) 100.2 F (37.9 C) (Rectal)  Resp 28  Ht 5\' 10"  (1.778 m)  Wt 245 lb (111.131 kg)  BMI 35.15 kg/m2  SpO2 96%  HEMODYNAMICS:    VENTILATOR SETTINGS:    INTAKE / OUTPUT:    PHYSICAL EXAMINATION: General:  Obese male in NAD Neuro:  Awakens to voice, oriented, follows commands, drowsy post narcotic administration  HEENT:  MM pink/dry, short / thick neck Cardiovascular:  s1s2 rrr, no m/r/g, tachy Lungs:  Even/non-labored, lungs bilaterally clear, diminished bases  Abdomen:  Obese/soft, bsx4 active, R perc nephrostomy drain with bloody drainage, old abd scarring, urostomy with clear yellow urine draining, pink stoma Musculoskeletal:  No acute deformities  Skin:  Warm/dry, no rashes or lesions  LABS:  BMET  Recent Labs Lab 12/15/14 0231  NA 143   K 4.2  CL 111  CO2 25  BUN 28*  CREATININE 1.67*  GLUCOSE 159*    Electrolytes  Recent Labs Lab 12/15/14 0231  CALCIUM 9.3    CBC  Recent Labs Lab 12/15/14 0231  WBC 9.5  HGB 16.7  HCT 51.5  PLT 182    Coag's  Recent Labs Lab 12/15/14 0902  APTT 31  INR 1.08    Sepsis Markers  Recent Labs Lab 12/15/14 0832  LATICACIDVEN 2.34*    ABG No results for input(s): PHART, PCO2ART, PO2ART in the last 168 hours.  Liver Enzymes  Recent Labs Lab 12/15/14 0231  AST 21  ALT 18  ALKPHOS 76  BILITOT 1.0  ALBUMIN 4.1    Cardiac Enzymes No results for input(s): TROPONINI, PROBNP in the last 168 hours.  Glucose No results for input(s): GLUCAP in the last 168 hours.  Imaging Ct Renal Stone Study  12/15/2014  CLINICAL DATA:  Acute onset of right lower back pain, extending to the right lower quadrant. Initial encounter. EXAM: CT ABDOMEN AND PELVIS WITHOUT CONTRAST TECHNIQUE: Multidetector CT imaging of the abdomen and pelvis was performed following the standard protocol without IV contrast. COMPARISON:  CT of the abdomen and pelvis performed 04/02/2014 FINDINGS: The visualized lung bases are clear. The liver and spleen are unremarkable in appearance. Layering high attenuation within the gallbladder likely reflects numerous tiny stones. The gallbladder is otherwise unremarkable. The pancreas and adrenal glands are unremarkable. There is severe right-sided hydronephrosis, with diffuse right-sided perinephric stranding and fluid, and diffuse dilatation of the right ureter to the level of a large 1.3 x 1.0 cm obstructing stone at the distal right ureter, approximately 2 cm above the patient's ileal conduit. An adjacent smaller 5 mm stone is noted more proximally within the right ureter. A 1.8 cm cyst is noted at the interpole region of the  left kidney. Mild nonspecific left-sided perinephric stranding is seen. A 4 mm nonobstructing stone is noted at the interpole region of  the right kidney. A 2.1 cm right renal cyst is noted. A few stones are noted within the ileal conduit, likely reflecting previously passed stones. The right lower quadrant urostomy is grossly unremarkable. No free fluid is identified. The small bowel is unremarkable in appearance. The stomach is within normal limits. No acute vascular abnormalities are seen. The appendix is normal in caliber, without evidence of appendicitis. Mild diverticulosis is noted along the ascending and sigmoid colon, without evidence of diverticulitis. The colon is otherwise unremarkable. The patient is status post cystectomy and prostatectomy. No inguinal lymphadenopathy is seen. No acute osseous abnormalities are identified. Chronic bilateral pars defects are seen at L5, without evidence of anterolisthesis. Vacuum phenomenon is noted at L4-L5. IMPRESSION: 1. Severe right-sided hydronephrosis, with diffuse right-sided perinephric stranding and fluid, and diffuse dilatation of the right ureter. Obstructing large 1.3 x 1.0 cm stone noted in the distal right ureter, approximately 2 cm above the patient's ileal conduit. Adjacent smaller 5 mm stone noted more proximally within the right ureter. 2. 4 mm nonobstructing stone at the interpole region of the right kidney. Small bilateral renal cysts seen. 3. Few stones within the ileal conduit likely reflect previously passed stones. Right lower quadrant urostomy is grossly unremarkable. 4. Cholelithiasis.  Gallbladder otherwise unremarkable. 5. Mild diverticulosis along the ascending and sigmoid colon, without evidence of diverticulitis fifth 6. Chronic bilateral pars defects at L5, without evidence of anterolisthesis. Electronically Signed   By: Garald Balding M.D.   On: 12/15/2014 04:19     STUDIES:  12/13  CT Renal Stone Study >> severe R hydronephrosis, diffuse right sided perinephric stranding & fluid, diffuse dilation of the R ureter, obstructing large 1.3 x1.0 cm stone in the distal R  ureter, 2cm above the patients ileal conduit, adjacent 32mm stone in R ureter, 4 mm non-obstructing stone at the interpole region of the R kidney, small bilateral renal cysts, cholelithiasis, mild diverticulosis in the ascending sigmoid colon  CULTURES: UA 12/13 >> many bacteria, nitrite positive, large leukocytes UC 12/13 >> BCx2 12/13 >>   ANTIBIOTICS: Gentamycin 12/13 x1  Rocephin 12/13 >>   SIGNIFICANT EVENTS: 12/13  Admit with obstructing stone in R ureter and concern for sepsis.    LINES/TUBES:   DISCUSSION: 69 y/o M with PMH of bladder cancer s/p cystoprostatectomy with ileoconduit in February 2014, bilateral ureteral reimplantation June 2014 and prolonged hospitalization in 2014 with pelvic hematoma, DVT and PE admitted with obstructing stone in the right ureter and concern for sepsis (Tmax 102, tachycardia, lactic acid 2.34).  Patient was taken to IR for Perc nephrostomy drain placement 12/13.    ASSESSMENT / PLAN:  PULMONARY A: At Risk Atelectasis - in setting of sepsis  ? OSA  P:   Pulmonary hygiene as able  Early mobilization  Oxygen as needed to support saturations > 92%  CARDIOVASCULAR A:  Tachycardia - in setting of sepsis, pain  Hx HLD P:  Monitor hemodynamics in SDU  RENAL A:   R Ureter Obstruction / 13 mm Stone s/p perc nephrostomy drain 12/13 per IR Concern for Urosepsis Elevated Lactic Acid  Hx Bladder / Prostate Cancer s/p cystoprostatectomy with ileal conduit formation (2014, spent 8 mo in hospital) P:   Trend BMP  Monitor serum creatinine closely with sepsis and abx administration  Perc drain per IR, monitor output  Repeat lactic acid & BMP now  Replace electrolytes as indicated  MIVF - 1/2 NS with KCL at 100 ml/hr  GASTROINTESTINAL A:   Hx GERD, Pelvic Hematoma (2014), Small intestine overgrowth P:   PPI Florastor with abx  Advance diet as tolerated  HEMATOLOGIC A:   Hx PE/DVT - previously on anticoagulation.   P:  Trend CBC   Heparin for DVT prophylaxis  Early ambulation   INFECTIOUS A:   Sepsis - in setting of obstructed ureter (R), 13 mm stone above anastomosis P:   ABX / cultures as above Monitor fever curve / WBC Repeat lactic acid now Hold further abx until BMP reviewed in am 12/14.  (On Rocephin)  Additional Rocephin to complete 2 g total on 12/13  ENDOCRINE A:   Hyperglycemia - hx of diet controlled DM P:   Monitor glucose on BMP  If consistently > 180, add SSI   NEUROLOGIC A:   Pain - in setting of obstructed ureter.   P:   RASS goal: n/a Promote sleep wake cycle PRN oxycodone Q6 PRN fentanyl for severe pain    FAMILY  - Updates: Wife and sister updated at bedside on patient's status.  - Inter-disciplinary family meet or Palliative Care meeting due by:  Discussed patient wishes on admission in regards to aggressive medical care. His wife states they both have living wills. They would request aggressive medical care for any and all reversible processes. However if despite advanced medical care he were to continue to decline they would not want prolonged artificial support.  FULL CODE.     Noe Gens, NP-C  Pulmonary & Critical Care Pgr: [phone] or if no answer [phone] 12/15/2014, 11:27 AM

## 2014-12-15 NOTE — ED Notes (Signed)
Paged admitting, spoke with tannenbaum, since start of shift, pt tachy, BP has dropped, rapid respirations, and wife reports pt is confused. Bed assignment will be changed.

## 2014-12-15 NOTE — Progress Notes (Signed)
Subjective:   1 - Bladder Cancer - s/p robotic cystoprostatectomy 02/21/2012  for [id number] BCG-refractory high-grade urothelial carcinoma. NO recurrence and compliant with post-op surveillance.    2 - Nephrolithiasis - Rt distal ureteral stones 53mm, 64mm distal with hydro as well as few small prox conduit stones by ER CT 12/15/2014. Rt neph tube placed 12/213.   3 - Urosepsis -Fevers, lactic acidosis, tachycardia, bacteruria c/w likley urosepsis by ER labs 12/15/14. Now on empiric Rocephin + Gent. [place], [place] 12/13 pending.  Tonight "Chad Olson" is stable. Had urgent neph tube placed for obstructing / infected stone and now with resolution of high fevers and tachycardia. Feels tired.   Objective: Vital signs in last 24 hours: Temp:  [97.9 F (36.6 C)-102 F (38.9 C)] 97.9 F (36.6 C) (12/13 1700) Pulse Rate:  [68-126] 74 (12/13 1600) Resp:  [16-32] 23 (12/13 1600) BP: (111-165)/(76-108) 117/76 mmHg (12/13 1600) SpO2:  [90 %-100 %] 99 % (12/13 1600) Weight:  [111.131 kg (245 lb)-114 kg (251 lb 5.2 oz)] 114 kg (251 lb 5.2 oz) (12/13 1253)    Intake/Output from previous day:   Intake/Output this shift: Total I/O In: 50 [IV Piggyback:50] Out: 1000 [Urine:1000]  General appearance: alert, cooperative, appears stated age and family at bedside in stepdown.  Head: Normocephalic, without obvious abnormality, atraumatic Nose: Nares normal. Septum midline. Mucosa normal. No drainage or sinus tenderness. Throat: lips, mucosa, and tongue normal; teeth and gums normal Back: symmetric, no curvature. ROM normal. No CVA tenderness., Rt neph tube with bloody urine in bag.  Resp: non-labored on minimal [place] O2 Cardio: HR 70s and regualr by bedside monitor. GI: soft, non-tender; bowel sounds normal; no masses,  no organomegaly and RLQ conduit pink / patent with clear urine.  Male genitalia: normal Extremities: extremities normal, atraumatic, no cyanosis or edema  Lab Results:   Recent Labs   12/15/14 0231  WBC 9.5  HGB 16.7  HCT 51.5  PLT 182   BMET  Recent Labs  12/15/14 0231 12/15/14 1356  NA 143 140  K 4.2 4.4  CL 111 114*  CO2 25 21*  GLUCOSE 159* 172*  BUN 28* 24*  CREATININE 1.67* 1.68*  CALCIUM 9.3 8.4*   PT/INR  Recent Labs  12/15/14 0902  LABPROT 14.2  INR 1.08   ABG No results for input(s): PHART, HCO3 in the last 72 hours.  Invalid input(s): PCO2, PO2  Studies/Results: Ct Renal Stone Study  12/15/2014  CLINICAL DATA:  Acute onset of right lower back pain, extending to the right lower quadrant. Initial encounter. EXAM: CT ABDOMEN AND PELVIS WITHOUT CONTRAST TECHNIQUE: Multidetector CT imaging of the abdomen and pelvis was performed following the standard protocol without IV contrast. COMPARISON:  CT of the abdomen and pelvis performed 04/02/2014 FINDINGS: The visualized lung bases are clear. The liver and spleen are unremarkable in appearance. Layering high attenuation within the gallbladder likely reflects numerous tiny stones. The gallbladder is otherwise unremarkable. The pancreas and adrenal glands are unremarkable. There is severe right-sided hydronephrosis, with diffuse right-sided perinephric stranding and fluid, and diffuse dilatation of the right ureter to the level of a large 1.3 x 1.0 cm obstructing stone at the distal right ureter, approximately 2 cm above the patient's ileal conduit. An adjacent smaller 5 mm stone is noted more proximally within the right ureter. A 1.8 cm cyst is noted at the interpole region of the left kidney. Mild nonspecific left-sided perinephric stranding is seen. A 4 mm nonobstructing stone is noted at the interpole  region of the right kidney. A 2.1 cm right renal cyst is noted. A few stones are noted within the ileal conduit, likely reflecting previously passed stones. The right lower quadrant urostomy is grossly unremarkable. No free fluid is identified. The small bowel is unremarkable in appearance. The stomach is  within normal limits. No acute vascular abnormalities are seen. The appendix is normal in caliber, without evidence of appendicitis. Mild diverticulosis is noted along the ascending and sigmoid colon, without evidence of diverticulitis. The colon is otherwise unremarkable. The patient is status post cystectomy and prostatectomy. No inguinal lymphadenopathy is seen. No acute osseous abnormalities are identified. Chronic bilateral pars defects are seen at L5, without evidence of anterolisthesis. Vacuum phenomenon is noted at L4-L5. IMPRESSION: 1. Severe right-sided hydronephrosis, with diffuse right-sided perinephric stranding and fluid, and diffuse dilatation of the right ureter. Obstructing large 1.3 x 1.0 cm stone noted in the distal right ureter, approximately 2 cm above the patient's ileal conduit. Adjacent smaller 5 mm stone noted more proximally within the right ureter. 2. 4 mm nonobstructing stone at the interpole region of the right kidney. Small bilateral renal cysts seen. 3. Few stones within the ileal conduit likely reflect previously passed stones. Right lower quadrant urostomy is grossly unremarkable. 4. Cholelithiasis.  Gallbladder otherwise unremarkable. 5. Mild diverticulosis along the ascending and sigmoid colon, without evidence of diverticulitis fifth 6. Chronic bilateral pars defects at L5, without evidence of anterolisthesis. Electronically Signed   By: Garald Balding M.D.   On: 12/15/2014 04:19   Ir Nephrostomy Placement Right  12/15/2014  CLINICAL DATA:  69 year old with acute unilateral obstructive uropathy. Severe right hydronephrosis due to right ureter stones. History of cystectomy and ileal conduit. EXAM: PERCUTANEOUS RIGHT NEPHROSTOMY TUBE PLACEMENT WITH ULTRASOUND AND FLUOROSCOPIC GUIDANCE Physician: Stephan Minister. Anselm Pancoast, MD FLUOROSCOPY TIME:  25 mGy, 1 minutes and 24 seconds MEDICATIONS: 4 mg Versed, 50 mcg fentanyl. A radiology nurse monitored the patient for moderate sedation. Patient is  receiving antibiotics and additional antibiotics were not given for this procedure. CONTRAST:  3 mL Omnipaque 300 ANESTHESIA/SEDATION: Moderate sedation time: 24 minutes PROCEDURE: The procedure was explained to the patient. The risks and benefits of the procedure were discussed and the patient's questions were addressed. Informed consent was obtained from the patient and wife. Patient was placed prone. The right flank was prepped and draped in sterile fashion. Maximal barrier sterile technique was utilized including caps, mask, sterile gowns, sterile gloves, sterile drape, hand hygiene and skin antiseptic. Skin was anesthetized with 1% lidocaine. Using ultrasound guidance, a 21 gauge needle was directed into the right kidney lower pole. A 0.018 wire was placed. Accustick dilator set was placed. Urine was identified within the catheter. Stiff Amplatz wire was placed and the tract was dilated to accommodate a 10.2 [place] multipurpose drain. 40 mL of bloody purulent fluid was aspirated from the collecting system. A small amount of contrast was injected to confirm placement in the renal pelvis. Catheter was sutured to the skin and attached to gravity bag. Fluoroscopic and ultrasound images were taken and saved for documentation. Fluid was sent for culture. FINDINGS: Moderate right hydronephrosis based on ultrasound. Nephrostomy tube placed in the renal pelvis. 40 mL of bloody purulent fluid was removed. Estimated blood loss: Minimal COMPLICATIONS: None IMPRESSION: Successful placement of right percutaneous nephrostomy tube with ultrasound and fluoroscopic guidance. Electronically Signed   By: Markus Daft M.D.   On: 12/15/2014 12:11    Anti-infectives: Anti-infectives    Start  Dose/Rate Route Frequency Ordered Stop   12/16/14 1400  cefTRIAXone (ROCEPHIN) 2 g in dextrose 5 % 50 mL IVPB     2 g 100 mL/hr over 30 Minutes Intravenous Every 24 hours 12/15/14 1350     12/15/14 1400  cefTRIAXone (ROCEPHIN) 1 g in  dextrose 5 % 50 mL IVPB     1 g 100 mL/hr over 30 Minutes Intravenous  Once 12/15/14 1350 12/15/14 1511   12/15/14 0945  gentamicin (GARAMYCIN) 620 mg in dextrose 5 % 50 mL IVPB    Comments:  Please have pharmacy calculate dose and interval   7 mg/kg  88.2 kg (Adjusted) 131 mL/hr over 30 Minutes Intravenous STAT 12/15/14 0935 12/15/14 1054   12/15/14 0900  gentamicin (GARAMYCIN) 2 mg/kg in dextrose 5 % 50 mL IVPB  Status:  Discontinued    Comments:  Please have pharmacy calculate dose and interval   2 mg/kg 100 mL/hr over 30 Minutes Intravenous Every 24 hours 12/15/14 0848 12/15/14 0935   12/15/14 0445  cefTRIAXone (ROCEPHIN) 1 g in dextrose 5 % 50 mL IVPB     1 g 100 mL/hr over 30 Minutes Intravenous  Once 12/15/14 0441 12/15/14 0525      Assessment/Plan:  1 - Bladder Cancer - no evidence of recurrence, continue annual surveillance.   2 - Nephrolithiasis - discusssed with pt and family reccomended path for endoscopic percutaneous removal in elective setting once clears infectious parameters. If still in house, may consider next week, otherwise in elective setting in next few weeks.   3 - Urosepsis -continue IV ABX pending further CX data. Appreciate critical care comangement.    [facility], Trecia Maring 12/15/2014

## 2014-12-15 NOTE — ED Notes (Signed)
Delay in lab draw, MD in room with pt

## 2014-12-15 NOTE — Progress Notes (Signed)
Patient ID: Chad Olson, male   DOB: [birth year], 69 y.o.   MRN: [id number]    Referring Physician(s): Wrenn,J  Chief Complaint:  Right nephrolithiasis, hydronephrosis, UTI  Subjective: Patient familiar to IR service from prior bilateral nephrostomies in 2014 and  pelvic hematoma drainage 2014. 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 Patient presented to ED last night with right flank pain, nausea and vomiting. He denied hematuria or dysuria. In ER patient was noted to be febrile with latest temp 102. CT of the abdomen and pelvis without contrast today reveals severe right-sided hydronephrosis with right-sided perinephric stranding and fluid and diffuse dilatation of the right ureter. There is no obstructing large 1.3 x 1.0*meters stone in the distal right ureter approximately 2 cm above the patient's ileal conduit. There was no adjacent smaller 5 mm stone proximally within the right ureter as well as a 4 mm nonobstructing stone at the interpole region of the right kidney. A few stones are noted within the ileal conduit likely reflecting previously passed stones. Patient was evaluated by urology and request now received for right percutaneous nephrostomy. Patient currently denies headache, chest pain, dyspnea, cough, abdominal pain, hematuria or dysuria. He does complain of right flank pain, and intermittent nausea and vomiting.    Allergies: Review of patient's allergies indicates no known allergies.  Medications: Prior to Admission medications   Medication Sig Start Date End Date Taking? Authorizing Provider  atorvastatin (LIPITOR) 40 MG tablet Take 40 mg by mouth. 10/23/13  Yes Historical Provider, MD  naproxen sodium (ANAPROX) 220 MG tablet  Take 440 mg by mouth 2 (two) times daily with a meal.   Yes Historical Provider, MD  pantoprazole (PROTONIX) 40 MG tablet Take 40 mg by mouth daily.    Yes Historical Provider, MD  Vitamin D, Ergocalciferol, (DRISDOL) 50000 UNITS CAPS capsule Take by mouth every 7 (seven) days.   Yes Historical Provider, MD     Vital Signs: BP 132/94 mmHg  Pulse 114  Temp(Src) 102 F (38.9 C) (Rectal)  Resp 28  SpO2 94%  Physical Exam patient is awake, alert. Chest-clear to auscultation bilaterally anteriorly. Heart-tachycardic with ectopy noted; abdomen obese, positive bowel sounds, intact ileal conduit draining yellow urine; mild to moderate right lateral abdominal/CVA tenderness to palpation; extremities-1-2+ bilateral extremity edema, no left lower extremity edema  Imaging: Ct Renal Stone Study  12/15/2014  CLINICAL DATA:  Acute onset of right lower back pain, extending to the right lower quadrant. Initial encounter. EXAM: CT ABDOMEN AND PELVIS WITHOUT CONTRAST TECHNIQUE: Multidetector CT imaging of the abdomen and pelvis was performed following the standard protocol without IV contrast. COMPARISON:  CT of the abdomen and pelvis performed 04/02/2014 FINDINGS: The visualized lung bases are clear. The liver and spleen are unremarkable in appearance. Layering high attenuation within the gallbladder likely reflects numerous tiny stones. The gallbladder is otherwise unremarkable. The pancreas and adrenal glands are unremarkable. There is severe right-sided hydronephrosis, with diffuse right-sided perinephric stranding and fluid, and diffuse dilatation of the right ureter to the level of a large 1.3 x 1.0 cm obstructing stone at the distal right ureter, approximately 2 cm above the patient's ileal conduit. An adjacent smaller 5 mm stone is noted more proximally within the right ureter. A 1.8 cm cyst is noted at the interpole region of the left kidney. Mild nonspecific  left-sided perinephric stranding is seen. A 4 mm  nonobstructing stone is noted at the interpole region of the right kidney. A 2.1 cm right renal cyst is noted. A few stones are noted within the ileal conduit, likely reflecting previously passed stones. The right lower quadrant urostomy is grossly unremarkable. No free fluid is identified. The small bowel is unremarkable in appearance. The stomach is within normal limits. No acute vascular abnormalities are seen. The appendix is normal in caliber, without evidence of appendicitis. Mild diverticulosis is noted along the ascending and sigmoid colon, without evidence of diverticulitis. The colon is otherwise unremarkable. The patient is status post cystectomy and prostatectomy. No inguinal lymphadenopathy is seen. No acute osseous abnormalities are identified. Chronic bilateral pars defects are seen at L5, without evidence of anterolisthesis. Vacuum phenomenon is noted at L4-L5. IMPRESSION: 1. Severe right-sided hydronephrosis, with diffuse right-sided perinephric stranding and fluid, and diffuse dilatation of the right ureter. Obstructing large 1.3 x 1.0 cm stone noted in the distal right ureter, approximately 2 cm above the patient's ileal conduit. Adjacent smaller 5 mm stone noted more proximally within the right ureter. 2. 4 mm nonobstructing stone at the interpole region of the right kidney. Small bilateral renal cysts seen. 3. Few stones within the ileal conduit likely reflect previously passed stones. Right lower quadrant urostomy is grossly unremarkable. 4. Cholelithiasis.  Gallbladder otherwise unremarkable. 5. Mild diverticulosis along the ascending and sigmoid colon, without evidence of diverticulitis fifth 6. Chronic bilateral pars defects at L5, without evidence of anterolisthesis. Electronically Signed   By: Garald Balding M.D.   On: 12/15/2014 04:19    Labs:  CBC:  Recent Labs  12/15/14 0231  WBC 9.5  HGB 16.7  HCT 51.5  PLT 182    COAGS: No results for input(s): INR, APTT in the last  8760 hours.  BMP:  Recent Labs  12/15/14 0231  NA 143  K 4.2  CL 111  CO2 25  GLUCOSE 159*  BUN 28*  CALCIUM 9.3  CREATININE 1.67*  GFRNONAA 40*  GFRAA 47*    LIVER FUNCTION TESTS:  Recent Labs  12/15/14 0231  BILITOT 1.0  AST 21  ALT 18  ALKPHOS 76  PROT 7.8  ALBUMIN 4.1    Assessment and Plan: Patient with hx of nephrolithiasis, prior history of bladder cancer with ileal conduit formation, now presenting with acute right flank pain, nausea, vomiting, fever,  urinalysis suggesting UTI (culture pending), elevated creatinine (1.67) and evidence of severe right-sided hydronephrosis on CT. WBC currently 9.5, hemoglobin 16.7, platelets 182 k. PT/INR pending. Request received from urology for right percutaneous nephrostomy. Patient has received Rocephin in ED and is also due to receive gentamicin. Risks and benefits discussed with the patient/wife including, but not limited to infection, bleeding, significant bleeding causing loss or decrease in renal function or damage to adjacent structures. All of the patient's questions were answered, patient is agreeable to proceed.Consent signed and in chart. Procedure scheduled for ASAP this a.m.           Signed: D. Rowe Robert 12/15/2014, 8:54 AM   I spent a total of 20 minutes at the the patient's bedside AND on the patient's hospital floor or unit, greater than 50% of which was counseling/coordinating care for right percutaneous nephrostomy

## 2014-12-15 NOTE — ED Notes (Signed)
Critical care at bedside  

## 2014-12-16 DIAGNOSIS — N201 Calculus of ureter: Secondary | ICD-10-CM | POA: Diagnosis not present

## 2014-12-16 LAB — CBC
HCT: 44.1 % (ref 39.0–52.0)
HEMOGLOBIN: 14.1 g/dL (ref 13.0–17.0)
MCH: 29.8 pg (ref 26.0–34.0)
MCHC: 32 g/dL (ref 30.0–36.0)
MCV: 93.2 fL (ref 78.0–100.0)
Platelets: 140 10*3/uL — ABNORMAL LOW (ref 150–400)
RBC: 4.73 MIL/uL (ref 4.22–5.81)
RDW: 15.8 % — ABNORMAL HIGH (ref 11.5–15.5)
WBC: 12.4 10*3/uL — ABNORMAL HIGH (ref 4.0–10.5)

## 2014-12-16 LAB — BASIC METABOLIC PANEL
ANION GAP: 6 (ref 5–15)
BUN: 20 mg/dL (ref 6–20)
CALCIUM: 8.2 mg/dL — AB (ref 8.9–10.3)
CO2: 24 mmol/L (ref 22–32)
Chloride: 110 mmol/L (ref 101–111)
Creatinine, Ser: 1.4 mg/dL — ABNORMAL HIGH (ref 0.61–1.24)
GFR, EST AFRICAN AMERICAN: 58 mL/min — AB (ref >60)
GFR, EST NON AFRICAN AMERICAN: 50 mL/min — AB (ref >60)
Glucose, Bld: 105 mg/dL — ABNORMAL HIGH (ref 65–99)
Potassium: 4.1 mmol/L (ref 3.5–5.1)
Sodium: 140 mmol/L (ref 135–145)

## 2014-12-16 LAB — URINE CULTURE: SPECIAL REQUESTS: NORMAL

## 2014-12-16 NOTE — Progress Notes (Signed)
Resumed care of patient. Agree with previous assessment. Will continue to monitor. 

## 2014-12-16 NOTE — Progress Notes (Signed)
Patient ID: Chad Olson, male   DOB: 05/14/1945, 69 y.o.   MRN: [id number]        Subjective: Pt doing ok; a little sore at  Rt PCN site; denies N/V today   Allergies: Review of patient's allergies indicates no known allergies.  Medications: Prior to Admission medications   Medication Sig Start Date End Date Taking? Authorizing Provider  atorvastatin (LIPITOR) 40 MG tablet Take 40 mg by mouth. 10/23/13  Yes Historical Provider, MD  naproxen sodium (ANAPROX) 220 MG tablet Take 440 mg by mouth 2 (two) times daily with a meal.   Yes Historical Provider, MD  pantoprazole (PROTONIX) 40 MG tablet Take 40 mg by mouth daily.    Yes Historical Provider, MD  Vitamin D, Ergocalciferol, (DRISDOL) 50000 UNITS CAPS capsule Take by mouth every 7 (seven) days.   Yes Historical Provider, MD     Vital Signs: BP 143/78 mmHg  Pulse 78  Temp(Src) 98.2 F (36.8 C) (Oral)  Resp 18  Ht 5\' 11"  (1.803 m)  Wt 251 lb 5.2 oz (114 kg)  BMI 35.07 kg/m2  SpO2 99%  Physical Exam rt PCN intact, insertion site with some blistering/erythema outer edge of tegaderm dressing,? Allergy- will switch to paper tape; site mildly tender, output 1.3 liters blood tinged urine; cx's- e coli  Imaging: Ct Renal Stone Study  12/15/2014  CLINICAL DATA:  Acute onset of right lower back pain, extending to the right lower quadrant. Initial encounter. EXAM: CT ABDOMEN AND PELVIS WITHOUT CONTRAST TECHNIQUE: Multidetector CT imaging of the abdomen and pelvis was performed following the standard protocol without IV contrast. COMPARISON:  CT of the abdomen and pelvis performed 04/02/2014 FINDINGS: The visualized lung bases are clear. The liver and spleen are unremarkable in appearance. Layering high attenuation within the gallbladder likely reflects numerous tiny stones. The gallbladder is otherwise unremarkable. The pancreas and adrenal glands are unremarkable. There is severe right-sided hydronephrosis, with diffuse right-sided  perinephric stranding and fluid, and diffuse dilatation of the right ureter to the level of a large 1.3 x 1.0 cm obstructing stone at the distal right ureter, approximately 2 cm above the patient's ileal conduit. An adjacent smaller 5 mm stone is noted more proximally within the right ureter. A 1.8 cm cyst is noted at the interpole region of the left kidney. Mild nonspecific left-sided perinephric stranding is seen. A 4 mm nonobstructing stone is noted at the interpole region of the right kidney. A 2.1 cm right renal cyst is noted. A few stones are noted within the ileal conduit, likely reflecting previously passed stones. The right lower quadrant urostomy is grossly unremarkable. No free fluid is identified. The small bowel is unremarkable in appearance. The stomach is within normal limits. No acute vascular abnormalities are seen. The appendix is normal in caliber, without evidence of appendicitis. Mild diverticulosis is noted along the ascending and sigmoid colon, without evidence of diverticulitis. The colon is otherwise unremarkable. The patient is status post cystectomy and prostatectomy. No inguinal lymphadenopathy is seen. No acute osseous abnormalities are identified. Chronic bilateral pars defects are seen at L5, without evidence of anterolisthesis. Vacuum phenomenon is noted at L4-L5. IMPRESSION: 1. Severe right-sided hydronephrosis, with diffuse right-sided perinephric stranding and fluid, and diffuse dilatation of the right ureter. Obstructing large 1.3 x 1.0 cm stone noted in the distal right ureter, approximately 2 cm above the patient's ileal conduit. Adjacent smaller 5 mm stone noted more proximally within the right ureter. 2. 4 mm nonobstructing stone at the  interpole region of the right kidney. Small bilateral renal cysts seen. 3. Few stones within the ileal conduit likely reflect previously passed stones. Right lower quadrant urostomy is grossly unremarkable. 4. Cholelithiasis.  Gallbladder  otherwise unremarkable. 5. Mild diverticulosis along the ascending and sigmoid colon, without evidence of diverticulitis fifth 6. Chronic bilateral pars defects at L5, without evidence of anterolisthesis. Electronically Signed   By: Garald Balding M.D.   On: 12/15/2014 04:19   Ir Nephrostomy Placement Right  12/15/2014  CLINICAL DATA:  69 year old with acute unilateral obstructive uropathy. Severe right hydronephrosis due to right ureter stones. History of cystectomy and ileal conduit. EXAM: PERCUTANEOUS RIGHT NEPHROSTOMY TUBE PLACEMENT WITH ULTRASOUND AND FLUOROSCOPIC GUIDANCE Physician: Stephan Minister. Anselm Pancoast, MD FLUOROSCOPY TIME:  25 mGy, 1 minutes and 24 seconds MEDICATIONS: 4 mg Versed, 50 mcg fentanyl. A radiology nurse monitored the patient for moderate sedation. Patient is receiving antibiotics and additional antibiotics were not given for this procedure. CONTRAST:  3 mL Omnipaque 300 ANESTHESIA/SEDATION: Moderate sedation time: 24 minutes PROCEDURE: The procedure was explained to the patient. The risks and benefits of the procedure were discussed and the patient's questions were addressed. Informed consent was obtained from the patient and wife. Patient was placed prone. The right flank was prepped and draped in sterile fashion. Maximal barrier sterile technique was utilized including caps, mask, sterile gowns, sterile gloves, sterile drape, hand hygiene and skin antiseptic. Skin was anesthetized with 1% lidocaine. Using ultrasound guidance, a 21 gauge needle was directed into the right kidney lower pole. A 0.018 wire was placed. Accustick dilator set was placed. Urine was identified within the catheter. Stiff Amplatz wire was placed and the tract was dilated to accommodate a 10.2 [place] multipurpose drain. 40 mL of bloody purulent fluid was aspirated from the collecting system. A small amount of contrast was injected to confirm placement in the renal pelvis. Catheter was sutured to the skin and attached to  gravity bag. Fluoroscopic and ultrasound images were taken and saved for documentation. Fluid was sent for culture. FINDINGS: Moderate right hydronephrosis based on ultrasound. Nephrostomy tube placed in the renal pelvis. 40 mL of bloody purulent fluid was removed. Estimated blood loss: Minimal COMPLICATIONS: None IMPRESSION: Successful placement of right percutaneous nephrostomy tube with ultrasound and fluoroscopic guidance. Electronically Signed   By: Markus Daft M.D.   On: 12/15/2014 12:11    Labs:  CBC:  Recent Labs  12/15/14 0231 12/16/14 0345  WBC 9.5 12.4*  HGB 16.7 14.1  HCT 51.5 44.1  PLT 182 140*    COAGS:  Recent Labs  12/15/14 0902  INR 1.08  APTT 31    BMP:  Recent Labs  12/15/14 0231 12/15/14 1356 12/16/14 0345  NA 143 140 140  K 4.2 4.4 4.1  CL 111 114* 110  CO2 25 21* 24  GLUCOSE 159* 172* 105*  BUN 28* 24* 20  CALCIUM 9.3 8.4* 8.2*  CREATININE 1.67* 1.68* 1.40*  GFRNONAA 40* 40* 50*  GFRAA 47* 47* 58*    LIVER FUNCTION TESTS:  Recent Labs  12/15/14 0231  BILITOT 1.0  AST 21  ALT 18  ALKPHOS 76  PROT 7.8  ALBUMIN 4.1    Assessment and Plan: Pt with nephrolithiasis, rt hydro, UTI, prev bladder ca with conduit; s/p rt PCN 12/13; output good; afebrile; WBC 12.4, HGB 14.1, PLTS 140K, creat 1.4(1.68), urine cx- prelim e coli, sens pend; blood cx neg to date; check final sensitivities; hydrate; other plans as outlined by urology; OOB as tolerated  Signed: D. Rowe Robert 12/16/2014, 12:30 PM   I spent a total of 15 minutes at the the patient's bedside AND on the patient's hospital floor or unit, greater than 50% of which was counseling/coordinating care for right nephrostomy

## 2014-12-16 NOTE — Progress Notes (Signed)
Subjective:  1 - Bladder Cancer - s/p robotic cystoprostatectomy 02/21/2012  for [id number] BCG-refractory high-grade urothelial carcinoma. NO recurrence and compliant with post-op surveillance.    2 - Nephrolithiasis - Rt distal ureteral stones 6mm, 53mm distal with hydro as well as few small prox conduit stones by ER CT 12/15/2014. Rt neph tube placed 12/213.   3 - Urosepsis -Fevers, lactic acidosis, tachycardia, bacteruria c/w likley urosepsis by ER labs 12/15/14. Now on empiric Rocephin + Gent. [place], [place] 12/13 pending.  Tonight "Chad Olson" is feeling stronger. More energy this AM. NO fevers overnight. Excellent UOP, resolved tachycardia. CX's pending.  Objective: Vital signs in last 24 hours: Temp:  [97.9 F (36.6 C)-102 F (38.9 C)] 98.9 F (37.2 C) (12/14 0749) Pulse Rate:  [68-126] 77 (12/14 0700) Resp:  [17-31] 21 (12/14 0700) BP: (111-144)/(76-100) 141/81 mmHg (12/14 0600) SpO2:  [90 %-100 %] 99 % (12/14 0700) Weight:  [111.131 kg (245 lb)-114 kg (251 lb 5.2 oz)] 114 kg (251 lb 5.2 oz) (12/13 1253) Last BM Date: 12/14/14  Intake/Output from previous day: 12/13 0701 - 12/14 0700 In: 2830 [P.O.:330; I.V.:1700; IV Piggyback:50] Out: 2430 [Urine:1175; [id number] Intake/Output this shift:    NAD, wife at bedside Hearing aides in place RRR by bedside monitor, HR 70s SNTND RLQ Urosotmy with clear urine Rt flank neph tueb with very light pink urine in bag NO c/c/e AOx3  Lab Results:   Recent Labs  12/15/14 0231 12/16/14 0345  WBC 9.5 12.4*  HGB 16.7 14.1  HCT 51.5 44.1  PLT 182 140*   BMET  Recent Labs  12/15/14 1356 12/16/14 0345  NA 140 140  K 4.4 4.1  CL 114* 110  CO2 21* 24  GLUCOSE 172* 105*  BUN 24* 20  CREATININE 1.68* 1.40*  CALCIUM 8.4* 8.2*   PT/INR  Recent Labs  12/15/14 0902  LABPROT 14.2  INR 1.08   ABG No results for input(s): PHART, HCO3 in the last 72 hours.  Invalid input(s): PCO2, PO2  Studies/Results: Ct Renal Stone  Study  12/15/2014  CLINICAL DATA:  Acute onset of right lower back pain, extending to the right lower quadrant. Initial encounter. EXAM: CT ABDOMEN AND PELVIS WITHOUT CONTRAST TECHNIQUE: Multidetector CT imaging of the abdomen and pelvis was performed following the standard protocol without IV contrast. COMPARISON:  CT of the abdomen and pelvis performed 04/02/2014 FINDINGS: The visualized lung bases are clear. The liver and spleen are unremarkable in appearance. Layering high attenuation within the gallbladder likely reflects numerous tiny stones. The gallbladder is otherwise unremarkable. The pancreas and adrenal glands are unremarkable. There is severe right-sided hydronephrosis, with diffuse right-sided perinephric stranding and fluid, and diffuse dilatation of the right ureter to the level of a large 1.3 x 1.0 cm obstructing stone at the distal right ureter, approximately 2 cm above the patient's ileal conduit. An adjacent smaller 5 mm stone is noted more proximally within the right ureter. A 1.8 cm cyst is noted at the interpole region of the left kidney. Mild nonspecific left-sided perinephric stranding is seen. A 4 mm nonobstructing stone is noted at the interpole region of the right kidney. A 2.1 cm right renal cyst is noted. A few stones are noted within the ileal conduit, likely reflecting previously passed stones. The right lower quadrant urostomy is grossly unremarkable. No free fluid is identified. The small bowel is unremarkable in appearance. The stomach is within normal limits. No acute vascular abnormalities are seen. The appendix is normal in caliber, without evidence  of appendicitis. Mild diverticulosis is noted along the ascending and sigmoid colon, without evidence of diverticulitis. The colon is otherwise unremarkable. The patient is status post cystectomy and prostatectomy. No inguinal lymphadenopathy is seen. No acute osseous abnormalities are identified. Chronic bilateral pars defects are  seen at L5, without evidence of anterolisthesis. Vacuum phenomenon is noted at L4-L5. IMPRESSION: 1. Severe right-sided hydronephrosis, with diffuse right-sided perinephric stranding and fluid, and diffuse dilatation of the right ureter. Obstructing large 1.3 x 1.0 cm stone noted in the distal right ureter, approximately 2 cm above the patient's ileal conduit. Adjacent smaller 5 mm stone noted more proximally within the right ureter. 2. 4 mm nonobstructing stone at the interpole region of the right kidney. Small bilateral renal cysts seen. 3. Few stones within the ileal conduit likely reflect previously passed stones. Right lower quadrant urostomy is grossly unremarkable. 4. Cholelithiasis.  Gallbladder otherwise unremarkable. 5. Mild diverticulosis along the ascending and sigmoid colon, without evidence of diverticulitis fifth 6. Chronic bilateral pars defects at L5, without evidence of anterolisthesis. Electronically Signed   By: Garald Balding M.D.   On: 12/15/2014 04:19   Ir Nephrostomy Placement Right  12/15/2014  CLINICAL DATA:  69 year old with acute unilateral obstructive uropathy. Severe right hydronephrosis due to right ureter stones. History of cystectomy and ileal conduit. EXAM: PERCUTANEOUS RIGHT NEPHROSTOMY TUBE PLACEMENT WITH ULTRASOUND AND FLUOROSCOPIC GUIDANCE Physician: Stephan Minister. Anselm Pancoast, MD FLUOROSCOPY TIME:  25 mGy, 1 minutes and 24 seconds MEDICATIONS: 4 mg Versed, 50 mcg fentanyl. A radiology nurse monitored the patient for moderate sedation. Patient is receiving antibiotics and additional antibiotics were not given for this procedure. CONTRAST:  3 mL Omnipaque 300 ANESTHESIA/SEDATION: Moderate sedation time: 24 minutes PROCEDURE: The procedure was explained to the patient. The risks and benefits of the procedure were discussed and the patient's questions were addressed. Informed consent was obtained from the patient and wife. Patient was placed prone. The right flank was prepped and draped in  sterile fashion. Maximal barrier sterile technique was utilized including caps, mask, sterile gowns, sterile gloves, sterile drape, hand hygiene and skin antiseptic. Skin was anesthetized with 1% lidocaine. Using ultrasound guidance, a 21 gauge needle was directed into the right kidney lower pole. A 0.018 wire was placed. Accustick dilator set was placed. Urine was identified within the catheter. Stiff Amplatz wire was placed and the tract was dilated to accommodate a 10.2 [place] multipurpose drain. 40 mL of bloody purulent fluid was aspirated from the collecting system. A small amount of contrast was injected to confirm placement in the renal pelvis. Catheter was sutured to the skin and attached to gravity bag. Fluoroscopic and ultrasound images were taken and saved for documentation. Fluid was sent for culture. FINDINGS: Moderate right hydronephrosis based on ultrasound. Nephrostomy tube placed in the renal pelvis. 40 mL of bloody purulent fluid was removed. Estimated blood loss: Minimal COMPLICATIONS: None IMPRESSION: Successful placement of right percutaneous nephrostomy tube with ultrasound and fluoroscopic guidance. Electronically Signed   By: Markus Daft M.D.   On: 12/15/2014 12:11    Anti-infectives: Anti-infectives    Start     Dose/Rate Route Frequency Ordered Stop   12/16/14 1400  cefTRIAXone (ROCEPHIN) 2 g in dextrose 5 % 50 mL IVPB     2 g 100 mL/hr over 30 Minutes Intravenous Every 24 hours 12/15/14 1350     12/15/14 1400  cefTRIAXone (ROCEPHIN) 1 g in dextrose 5 % 50 mL IVPB     1 g 100 mL/hr over 30 Minutes  Intravenous  Once 12/15/14 1350 12/15/14 1511   12/15/14 0945  gentamicin (GARAMYCIN) 620 mg in dextrose 5 % 50 mL IVPB    Comments:  Please have pharmacy calculate dose and interval   7 mg/kg  88.2 kg (Adjusted) 131 mL/hr over 30 Minutes Intravenous STAT 12/15/14 0935 12/15/14 1054   12/15/14 0900  gentamicin (GARAMYCIN) 2 mg/kg in dextrose 5 % 50 mL IVPB  Status:  Discontinued     Comments:  Please have pharmacy calculate dose and interval   2 mg/kg 100 mL/hr over 30 Minutes Intravenous Every 24 hours 12/15/14 0848 12/15/14 0935   12/15/14 0445  cefTRIAXone (ROCEPHIN) 1 g in dextrose 5 % 50 mL IVPB     1 g 100 mL/hr over 30 Minutes Intravenous  Once 12/15/14 0441 12/15/14 0525      Assessment/Plan:  1 - Bladder Cancer - no evidence of recurrence, continue annual surveillance.   2 - Nephrolithiasis - discusssed with pt and family reccomended path for endoscopic percutaneous removal in elective setting once clears infectious parameters. If still in house, may consider next week, otherwise in elective setting in next few weeks.   3 - Urosepsis -continue IV ABX pending further CX data. Appreciate critical care comangement.   Transfer to med-surg floor and begin reg diet. Remain in house.      [facility], Teddie Curd 12/16/2014

## 2014-12-16 NOTE — Consult Note (Signed)
PULMONARY / CRITICAL CARE MEDICINE   Name: Chad Olson MRN: [id number] DOB: 1945-01-19    ADMISSION DATE:  12/15/2014 CONSULTATION DATE:  12/15/14  REFERRING MD:  Dr. 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 Hospital course at that time was complicated by DVT/PE (completed approximately 6 month anticoagulation) pelvic hematoma, prior bilateral nephrostomies, and SIBO (treated at [facility] for small intestine bowel overgrowth) who presented to [facility] on 12/13 with acute onset right flank pain.  The patient was in his usual state of health on 12/12.  He woke around midnight with acute onset right flank pain, nausea that progressed to vomiting. The patient denied hematuria.  Initial evaluation in the ER noted the patient to be febrile with temperature up to 102. CT of the abdomen was assessed and revealed severe right-sided hydronephrosis with right-sided perinephric stranding, fluid, diffuse dilation of the right ureter, large obstructing 1.3 x 1.0 cm stone in the right distal ureter approximate 2 cm above the patient's ileal conduit.  CT also noted adjacent smaller 5 mm stone proximally in the right ureter and a 4 mm nonobstructing stone at the interpole region of the right kidney. The patient was admitted by urology. Interventional radiology was consulted for evaluation for urgent placement of percutaneous nephrostomy tube. The patient was treated with gentamicin, rocephin and gentle fluid resuscitation.  Initial labs - UA  concerning for UTI, NA 143, K4.2, creatinine 1.67 (baseline creatinine approximately 1.1), glucose 159, lactic acid 2.34, WBC 9.5, Hgb 16.7 and platelets 182.  The patient remained hemodynamically stable.  PCCM consulted for sepsis evaluation.     PAST MEDICAL HISTORY :  He  has a past medical history of Hypertension; Hyperlipemia; Impaired hearing (BILATERAL AIDS); History of concussion (1992); Acid reflux (WATCHES DIET); Diet-controlled type 2 diabetes mellitus ([place]); Arthritis; Hemorrhoids; Carcinoma in situ of bladder (RECURRENT); Bilateral leg pain; History of basal cell carcinoma excision (BASE OF LEFT EAR); Erythrocytosis (LABS STABLE); Cancer [facility]) (Feb 2014); Pelvic hematoma, male (06/26/2012); Blood transfusion without reported diagnosis; Clotting disorder ([place]); and DVT (deep venous thrombosis) ([place]).  PAST SURGICAL HISTORY: He  has past surgical history that includes Transurethral resection of bladder tumor (01-11-2010); Knee arthroscopy (2009); Total knee arthroplasty (2009); Cystoscopy with biopsy (01/09/2011); Cystoscopy with biopsy (07/12/2011); Cystoscopy w/ retrogrades (07/12/2011); Cystoscopy w/ retrogrades (12/21/2011); Cystoscopy with biopsy (12/21/2011); Robot assisted laparoscopic complete cystect ileal conduit (N/A, 02/22/2012); Lymphadenectomy (Bilateral, 02/22/2012); Cystoscopy (N/A, 02/22/2012); Ureteral reimplantion (Bilateral, 06/14/2012); Esophagogastroduodenoscopy (N/A, 07/02/2012); Flexible sigmoidoscopy (N/A, 07/12/2012); Flexible sigmoidoscopy (N/A, 07/24/2012); and Hernia repair (2015).  No Known Allergies  No current facility-administered medications on file prior to encounter.   Current Outpatient Prescriptions on File Prior to Encounter  Medication Sig  . atorvastatin (LIPITOR) 40 MG tablet Take 40 mg by mouth.  . pantoprazole (PROTONIX) 40 MG tablet Take 40 mg by mouth daily.     FAMILY HISTORY:  His indicated that his mother is deceased. He indicated that his father is  deceased.   SOCIAL HISTORY: He  reports that he has never smoked. He has never used smokeless tobacco. He reports that he does not drink alcohol or use illicit  drugs.  REVIEW OF SYSTEMS:   Gen: Denies fever, chills, weight change, fatigue, night sweats HEENT: Denies blurred vision, double vision, hearing loss, tinnitus, sinus congestion, rhinorrhea, sore throat, neck stiffness, dysphagia PULM: Denies shortness of breath, cough, sputum production, hemoptysis, wheezing CV: Denies chest pain, edema, orthopnea, paroxysmal nocturnal dyspnea, palpitations GI: Denies abdominal pain, diarrhea, hematochezia, melena, constipation, change in bowel habits.  Reports acute onset right sided flank pain, nausea and vomiting.  GU: Denies dysuria, hematuria, polyuria, oliguria, urethral discharge Endocrine: Denies hot or cold intolerance, polyuria, polyphagia or appetite change Derm: Denies rash, dry skin, scaling or peeling skin change Heme: Denies easy bruising, bleeding, bleeding gums Neuro: Denies headache, numbness, weakness, slurred speech, loss of memory or consciousness   SUBJECTIVE: Feels well with no complaints. Asking for food.   VITAL SIGNS: BP 144/91 mmHg  Pulse 80  Temp(Src) 98.9 F (37.2 C) (Oral)  Resp 16  Ht 5\' 11"  (1.803 m)  Wt 251 lb 5.2 oz (114 kg)  BMI 35.07 kg/m2  SpO2 99%  HEMODYNAMICS:    VENTILATOR SETTINGS:    INTAKE / OUTPUT: I/O last 3 completed shifts: In: 2830 [P.O.:330; I.V.:1700; Other:750; IV Piggyback:50] Out: 2430 [Urine:1175; [id number]  PHYSICAL EXAMINATION: General:  No distress Neuro:  Awake oriented, no focal deficits HEENT:  Moist mucous membranes Cardiovascular: S1-S2, regular rate and rhythm, no MRG Lungs:  Clear, no wheeze, crackles  Abdomen:  Soft, nontender, nondistended. Right nephrostomy tube in place. Musculoskeletal:  No acute deformities  Skin:  No rashes  LABS:  BMET  Recent Labs Lab 12/15/14 0231 12/15/14 1356  12/16/14 0345  NA 143 140 140  K 4.2 4.4 4.1  CL 111 114* 110  CO2 25 21* 24  BUN 28* 24* 20  CREATININE 1.67* 1.68* 1.40*  GLUCOSE 159* 172* 105*    Electrolytes  Recent Labs Lab 12/15/14 0231 12/15/14 1356 12/16/14 0345  CALCIUM 9.3 8.4* 8.2*    CBC  Recent Labs Lab 12/15/14 0231 12/16/14 0345  WBC 9.5 12.4*  HGB 16.7 14.1  HCT 51.5 44.1  PLT 182 140*    Coag's  Recent Labs Lab 12/15/14 0902  APTT 31  INR 1.08    Sepsis Markers  Recent Labs Lab 12/15/14 0832 12/15/14 1158  LATICACIDVEN 2.34* 1.77    ABG No results for input(s): PHART, PCO2ART, PO2ART in the last 168 hours.  Liver Enzymes  Recent Labs Lab 12/15/14 0231  AST 21  ALT 18  ALKPHOS 76  BILITOT 1.0  ALBUMIN 4.1    Cardiac Enzymes No results for input(s): TROPONINI, PROBNP in the last 168 hours.  Glucose No results for input(s): GLUCAP in the last 168 hours.  Imaging Ir Nephrostomy Placement Right  12/15/2014  CLINICAL DATA:  69 year old with acute unilateral obstructive uropathy. Severe right hydronephrosis due to right ureter stones. History of cystectomy and ileal conduit. EXAM: PERCUTANEOUS RIGHT NEPHROSTOMY TUBE PLACEMENT WITH ULTRASOUND AND FLUOROSCOPIC GUIDANCE Physician: Stephan Minister. Anselm Pancoast, MD FLUOROSCOPY TIME:  25 mGy, 1 minutes and 24 seconds MEDICATIONS: 4 mg Versed, 50 mcg fentanyl. A radiology nurse monitored the patient for moderate sedation. Patient is receiving antibiotics and additional antibiotics were not given for this procedure. CONTRAST:  3 mL Omnipaque 300 ANESTHESIA/SEDATION: Moderate sedation time: 24 minutes PROCEDURE: The procedure was explained to the patient. The risks and benefits of the procedure were discussed and the patient's questions were addressed. Informed consent was obtained from the patient and wife. Patient was placed prone. The right flank was prepped and draped  in sterile fashion. Maximal barrier sterile technique was utilized including caps,  mask, sterile gowns, sterile gloves, sterile drape, hand hygiene and skin antiseptic. Skin was anesthetized with 1% lidocaine. Using ultrasound guidance, a 21 gauge needle was directed into the right kidney lower pole. A 0.018 wire was placed. Accustick dilator set was placed. Urine was identified within the catheter. Stiff Amplatz wire was placed and the tract was dilated to accommodate a 10.2 [place] multipurpose drain. 40 mL of bloody purulent fluid was aspirated from the collecting system. A small amount of contrast was injected to confirm placement in the renal pelvis. Catheter was sutured to the skin and attached to gravity bag. Fluoroscopic and ultrasound images were taken and saved for documentation. Fluid was sent for culture. FINDINGS: Moderate right hydronephrosis based on ultrasound. Nephrostomy tube placed in the renal pelvis. 40 mL of bloody purulent fluid was removed. Estimated blood loss: Minimal COMPLICATIONS: None IMPRESSION: Successful placement of right percutaneous nephrostomy tube with ultrasound and fluoroscopic guidance. Electronically Signed   By: Markus Daft M.D.   On: 12/15/2014 12:11     STUDIES:  12/13  CT Renal Stone Study >> severe R hydronephrosis, diffuse right sided perinephric stranding & fluid, diffuse dilation of the R ureter, obstructing large 1.3 x1.0 cm stone in the distal R ureter, 2cm above the patients ileal conduit, adjacent 40mm stone in R ureter, 4 mm non-obstructing stone at the interpole region of the R kidney, small bilateral renal cysts, cholelithiasis, mild diverticulosis in the ascending sigmoid colon  CULTURES: UA 12/13 >> many bacteria, nitrite positive, large leukocytes UC 12/13 >> BCx2 12/13 >>   ANTIBIOTICS: Gentamycin 12/13 x1  Rocephin 12/13 >>   SIGNIFICANT EVENTS: 12/13  Admit with obstructing stone in R ureter and concern for sepsis.    LINES/TUBES:   DISCUSSION: 69 y/o M with PMH of bladder cancer s/p cystoprostatectomy with  ileoconduit in February 2014, bilateral ureteral reimplantation June 2014 and prolonged hospitalization in 2014 with pelvic hematoma, DVT and PE admitted with obstructing stone in the right ureter and concern for sepsis (Tmax 102, tachycardia, lactic acid 2.34).  Patient was taken to IR for Perc nephrostomy drain placement 12/13.    ASSESSMENT / PLAN:  PULMONARY A: At Risk Atelectasis - in setting of sepsis  ? OSA  P:   Pulmonary hygiene as able  Early mobilization  Oxygen as needed to support saturations > 92%  CARDIOVASCULAR A:  Tachycardia - in setting of sepsis, pain  Hx HLD P:  Hemodynamics are stable. Lactate has normalized.  RENAL A:   R Ureter Obstruction / 13 mm Stone s/p perc nephrostomy drain 12/13 per IR Concern for Urosepsis Elevated Lactic Acid  Hx Bladder / Prostate Cancer s/p cystoprostatectomy with ileal conduit formation (2014, spent 8 mo in hospital) P:   Trend BMP  Urine output is improving Perc drain per IR, monitor output   GASTROINTESTINAL A:   Hx GERD, Pelvic Hematoma (2014), Small intestine overgrowth P:   PPI Florastor with abx  Advance diet as tolerated  HEMATOLOGIC A:   Hx PE/DVT - previously on anticoagulation.   P:  Trend CBC  Heparin for DVT prophylaxis  Early ambulation   INFECTIOUS A:   Sepsis - in setting of obstructed ureter (R), 13 mm stone above anastomosis P:   ABX / cultures as above Monitor fever curve / WBC  ENDOCRINE A:   Hyperglycemia - hx of diet controlled DM P:   Monitor glucose on BMP  If consistently >  180, add SSI   NEUROLOGIC A:   Pain - in setting of obstructed ureter.   P:   RASS goal: n/a Promote sleep wake cycle PRN oxycodone Q6 PRN fentanyl for severe pain    FAMILY  - Updates: Wife and sister updated at bedside on patient's status. - Inter-disciplinary family meet or Palliative Care meeting due by:  Discussed patient wishes on admission in regards to aggressive medical care. His wife  states they both have living wills. They would request aggressive medical care for any and all reversible processes. However if despite advanced medical care he were to continue to decline they would not want prolonged artificial support.  FULL CODE.  Patient due to transfer to the floor today. We will sign off. Please call back with any new issues.  Marshell Garfinkel MD [facility] Pager [phone] If no answer or after 3pm call: [phone] 12/16/2014, 8:48 AM

## 2014-12-17 DIAGNOSIS — I10 Essential (primary) hypertension: Secondary | ICD-10-CM | POA: Diagnosis present

## 2014-12-17 DIAGNOSIS — E785 Hyperlipidemia, unspecified: Secondary | ICD-10-CM | POA: Diagnosis present

## 2014-12-17 DIAGNOSIS — Z85828 Personal history of other malignant neoplasm of skin: Secondary | ICD-10-CM | POA: Diagnosis not present

## 2014-12-17 DIAGNOSIS — Z79899 Other long term (current) drug therapy: Secondary | ICD-10-CM | POA: Diagnosis not present

## 2014-12-17 DIAGNOSIS — K219 Gastro-esophageal reflux disease without esophagitis: Secondary | ICD-10-CM | POA: Diagnosis present

## 2014-12-17 DIAGNOSIS — Z936 Other artificial openings of urinary tract status: Secondary | ICD-10-CM | POA: Diagnosis not present

## 2014-12-17 DIAGNOSIS — Z86718 Personal history of other venous thrombosis and embolism: Secondary | ICD-10-CM | POA: Diagnosis not present

## 2014-12-17 DIAGNOSIS — Z801 Family history of malignant neoplasm of trachea, bronchus and lung: Secondary | ICD-10-CM | POA: Diagnosis not present

## 2014-12-17 DIAGNOSIS — K649 Unspecified hemorrhoids: Secondary | ICD-10-CM | POA: Diagnosis present

## 2014-12-17 DIAGNOSIS — H9190 Unspecified hearing loss, unspecified ear: Secondary | ICD-10-CM | POA: Diagnosis present

## 2014-12-17 DIAGNOSIS — Z791 Long term (current) use of non-steroidal anti-inflammatories (NSAID): Secondary | ICD-10-CM | POA: Diagnosis not present

## 2014-12-17 DIAGNOSIS — C679 Malignant neoplasm of bladder, unspecified: Secondary | ICD-10-CM | POA: Diagnosis present

## 2014-12-17 DIAGNOSIS — Z86711 Personal history of pulmonary embolism: Secondary | ICD-10-CM | POA: Diagnosis not present

## 2014-12-17 DIAGNOSIS — E872 Acidosis: Secondary | ICD-10-CM | POA: Diagnosis present

## 2014-12-17 DIAGNOSIS — Z8546 Personal history of malignant neoplasm of prostate: Secondary | ICD-10-CM | POA: Diagnosis not present

## 2014-12-17 DIAGNOSIS — N132 Hydronephrosis with renal and ureteral calculous obstruction: Secondary | ICD-10-CM | POA: Diagnosis present

## 2014-12-17 DIAGNOSIS — A419 Sepsis, unspecified organism: Secondary | ICD-10-CM | POA: Diagnosis present

## 2014-12-17 DIAGNOSIS — B9620 Unspecified Escherichia coli [E. coli] as the cause of diseases classified elsewhere: Secondary | ICD-10-CM | POA: Diagnosis present

## 2014-12-17 DIAGNOSIS — N136 Pyonephrosis: Secondary | ICD-10-CM | POA: Diagnosis present

## 2014-12-17 DIAGNOSIS — E1165 Type 2 diabetes mellitus with hyperglycemia: Secondary | ICD-10-CM | POA: Diagnosis present

## 2014-12-17 DIAGNOSIS — Z906 Acquired absence of other parts of urinary tract: Secondary | ICD-10-CM | POA: Diagnosis not present

## 2014-12-17 DIAGNOSIS — N139 Obstructive and reflux uropathy, unspecified: Secondary | ICD-10-CM | POA: Insufficient documentation

## 2014-12-17 DIAGNOSIS — Z96652 Presence of left artificial knee joint: Secondary | ICD-10-CM | POA: Diagnosis present

## 2014-12-17 DIAGNOSIS — Z9079 Acquired absence of other genital organ(s): Secondary | ICD-10-CM | POA: Diagnosis not present

## 2014-12-17 DIAGNOSIS — R109 Unspecified abdominal pain: Secondary | ICD-10-CM | POA: Diagnosis present

## 2014-12-17 DIAGNOSIS — Z8249 Family history of ischemic heart disease and other diseases of the circulatory system: Secondary | ICD-10-CM | POA: Diagnosis not present

## 2014-12-17 DIAGNOSIS — Z8551 Personal history of malignant neoplasm of bladder: Secondary | ICD-10-CM | POA: Diagnosis not present

## 2014-12-17 DIAGNOSIS — M1990 Unspecified osteoarthritis, unspecified site: Secondary | ICD-10-CM | POA: Diagnosis present

## 2014-12-17 DIAGNOSIS — D090 Carcinoma in situ of bladder: Secondary | ICD-10-CM | POA: Diagnosis present

## 2014-12-17 DIAGNOSIS — N179 Acute kidney failure, unspecified: Secondary | ICD-10-CM | POA: Diagnosis present

## 2014-12-17 MED ORDER — SULFAMETHOXAZOLE-TRIMETHOPRIM 800-160 MG PO TABS: 1.0000 | ORAL_TABLET | Freq: Two times a day (BID) | ORAL | Status: DC

## 2014-12-17 MED ORDER — TRAMADOL HCL 50 MG PO TABS: 50.0000 mg | ORAL_TABLET | Freq: Four times a day (QID) | ORAL | Status: DC | PRN

## 2014-12-17 NOTE — Progress Notes (Signed)
Reviewed discharge information with patient and caregiver. Answered all questions. Patient/caregiver able to teach back medications and reasons to contact MD/911. Patient verbalizes importance of PCP follow up appointment.   

## 2014-12-17 NOTE — Discharge Instructions (Signed)
1 - You may have bloody urine on / off with nephrostomy in place. This is normal.  2 - Call MD or go to ER for fever >102, severe pain / nausea / vomiting not relieved by medications, or acute change in medical status

## 2014-12-17 NOTE — Progress Notes (Signed)
Patient ID: Chad Olson, male   DOB: 1945-02-06, 69 y.o.   MRN: [id number]           Subjective:  Pt without new c/o  Allergies: Review of patient's allergies indicates no known allergies.  Medications: Prior to Admission medications   Medication Sig Start Date End Date Taking? Authorizing Provider  atorvastatin (LIPITOR) 40 MG tablet Take 40 mg by mouth. 10/23/13  Yes Historical Provider, MD  naproxen sodium (ANAPROX) 220 MG tablet Take 440 mg by mouth 2 (two) times daily with a meal.   Yes Historical Provider, MD  pantoprazole (PROTONIX) 40 MG tablet Take 40 mg by mouth daily.    Yes Historical Provider, MD  Vitamin D, Ergocalciferol, (DRISDOL) 50000 UNITS CAPS capsule Take by mouth every 7 (seven) days.   Yes Historical Provider, MD     Vital Signs: BP 151/77 mmHg  Pulse 73  Temp(Src) 98.2 F (36.8 C) (Oral)  Resp 20  Ht 5\' 11"  (1.803 m)  Wt 251 lb 5.2 oz (114 kg)  BMI 35.07 kg/m2  SpO2 96%  Physical Exam right PCN intact, dressing dry, site mildly tender to palpation, redness from tape irritation has decreased; sl blood-tinged urine in bag- output 850 cc; cx's - e coli  Imaging: Ct Renal Stone Study  12/15/2014  CLINICAL DATA:  Acute onset of right lower back pain, extending to the right lower quadrant. Initial encounter. EXAM: CT ABDOMEN AND PELVIS WITHOUT CONTRAST TECHNIQUE: Multidetector CT imaging of the abdomen and pelvis was performed following the standard protocol without IV contrast. COMPARISON:  CT of the abdomen and pelvis performed 04/02/2014 FINDINGS: The visualized lung bases are clear. The liver and spleen are unremarkable in appearance. Layering high attenuation within the gallbladder likely reflects numerous tiny stones. The gallbladder is otherwise unremarkable. The pancreas and adrenal glands are unremarkable. There is severe right-sided hydronephrosis, with diffuse right-sided perinephric stranding and fluid, and diffuse dilatation of the right  ureter to the level of a large 1.3 x 1.0 cm obstructing stone at the distal right ureter, approximately 2 cm above the patient's ileal conduit. An adjacent smaller 5 mm stone is noted more proximally within the right ureter. A 1.8 cm cyst is noted at the interpole region of the left kidney. Mild nonspecific left-sided perinephric stranding is seen. A 4 mm nonobstructing stone is noted at the interpole region of the right kidney. A 2.1 cm right renal cyst is noted. A few stones are noted within the ileal conduit, likely reflecting previously passed stones. The right lower quadrant urostomy is grossly unremarkable. No free fluid is identified. The small bowel is unremarkable in appearance. The stomach is within normal limits. No acute vascular abnormalities are seen. The appendix is normal in caliber, without evidence of appendicitis. Mild diverticulosis is noted along the ascending and sigmoid colon, without evidence of diverticulitis. The colon is otherwise unremarkable. The patient is status post cystectomy and prostatectomy. No inguinal lymphadenopathy is seen. No acute osseous abnormalities are identified. Chronic bilateral pars defects are seen at L5, without evidence of anterolisthesis. Vacuum phenomenon is noted at L4-L5. IMPRESSION: 1. Severe right-sided hydronephrosis, with diffuse right-sided perinephric stranding and fluid, and diffuse dilatation of the right ureter. Obstructing large 1.3 x 1.0 cm stone noted in the distal right ureter, approximately 2 cm above the patient's ileal conduit. Adjacent smaller 5 mm stone noted more proximally within the right ureter. 2. 4 mm nonobstructing stone at the interpole region of the right kidney. Small bilateral renal cysts  seen. 3. Few stones within the ileal conduit likely reflect previously passed stones. Right lower quadrant urostomy is grossly unremarkable. 4. Cholelithiasis.  Gallbladder otherwise unremarkable. 5. Mild diverticulosis along the ascending and  sigmoid colon, without evidence of diverticulitis fifth 6. Chronic bilateral pars defects at L5, without evidence of anterolisthesis. Electronically Signed   By: Garald Balding M.D.   On: 12/15/2014 04:19   Ir Nephrostomy Placement Right  12/15/2014  CLINICAL DATA:  69 year old with acute unilateral obstructive uropathy. Severe right hydronephrosis due to right ureter stones. History of cystectomy and ileal conduit. EXAM: PERCUTANEOUS RIGHT NEPHROSTOMY TUBE PLACEMENT WITH ULTRASOUND AND FLUOROSCOPIC GUIDANCE Physician: Stephan Minister. Anselm Pancoast, MD FLUOROSCOPY TIME:  25 mGy, 1 minutes and 24 seconds MEDICATIONS: 4 mg Versed, 50 mcg fentanyl. A radiology nurse monitored the patient for moderate sedation. Patient is receiving antibiotics and additional antibiotics were not given for this procedure. CONTRAST:  3 mL Omnipaque 300 ANESTHESIA/SEDATION: Moderate sedation time: 24 minutes PROCEDURE: The procedure was explained to the patient. The risks and benefits of the procedure were discussed and the patient's questions were addressed. Informed consent was obtained from the patient and wife. Patient was placed prone. The right flank was prepped and draped in sterile fashion. Maximal barrier sterile technique was utilized including caps, mask, sterile gowns, sterile gloves, sterile drape, hand hygiene and skin antiseptic. Skin was anesthetized with 1% lidocaine. Using ultrasound guidance, a 21 gauge needle was directed into the right kidney lower pole. A 0.018 wire was placed. Accustick dilator set was placed. Urine was identified within the catheter. Stiff Amplatz wire was placed and the tract was dilated to accommodate a 10.2 [place] multipurpose drain. 40 mL of bloody purulent fluid was aspirated from the collecting system. A small amount of contrast was injected to confirm placement in the renal pelvis. Catheter was sutured to the skin and attached to gravity bag. Fluoroscopic and ultrasound images were taken and saved for  documentation. Fluid was sent for culture. FINDINGS: Moderate right hydronephrosis based on ultrasound. Nephrostomy tube placed in the renal pelvis. 40 mL of bloody purulent fluid was removed. Estimated blood loss: Minimal COMPLICATIONS: None IMPRESSION: Successful placement of right percutaneous nephrostomy tube with ultrasound and fluoroscopic guidance. Electronically Signed   By: Markus Daft M.D.   On: 12/15/2014 12:11    Labs:  CBC:  Recent Labs  12/15/14 0231 12/16/14 0345  WBC 9.5 12.4*  HGB 16.7 14.1  HCT 51.5 44.1  PLT 182 140*    COAGS:  Recent Labs  12/15/14 0902  INR 1.08  APTT 31    BMP:  Recent Labs  12/15/14 0231 12/15/14 1356 12/16/14 0345  NA 143 140 140  K 4.2 4.4 4.1  CL 111 114* 110  CO2 25 21* 24  GLUCOSE 159* 172* 105*  BUN 28* 24* 20  CALCIUM 9.3 8.4* 8.2*  CREATININE 1.67* 1.68* 1.40*  GFRNONAA 40* 40* 50*  GFRAA 47* 47* 58*    LIVER FUNCTION TESTS:  Recent Labs  12/15/14 0231  BILITOT 1.0  AST 21  ALT 18  ALKPHOS 76  PROT 7.8  ALBUMIN 4.1    Assessment and Plan: Pt with nephrolithiasis, rt hydro, UTI, prev bladder ca with conduit; s/p rt PCN 12/13; afebrile; urine cx's- e coli sens to rocephin; 12/14 WBC 12.4(9.5)- ; hgb 14.1, plts 140k; creat 1.4; monitor labs, other plans as per urology.   Signed: D. Rowe Robert 12/17/2014, 1:58 PM   I spent a total of 15 minutes at the the  patient's bedside AND on the patient's hospital floor or unit, greater than 50% of which was counseling/coordinating care for right nephrostomy

## 2014-12-17 NOTE — Discharge Summary (Signed)
Physician Discharge Summary  Patient ID: Chad Olson MRN: [id number] DOB/AGE: 1945/06/14 69 y.o.  Admit date: 12/15/2014 Discharge date: 12/17/2014  Admission Diagnoses: Rt Ureteral Stone, Urosepsis  Discharge Diagnoses:  Principal Problem:   Ureteral calculus, right Active Problems:   Presence of urostomy ([place])   Right ureteral stone   Acute unilateral obstructive uropathy   Sepsis ([place])   Discharged Condition: good  Hospital Course:   1 - Nephrolithiasis - Rt distal ureteral stones 55mm, 55mm distal with hydro as well as few small prox conduit stones by ER CT 12/15/2014. Rt neph tube placed 12/13.  3 - Urosepsis -Fevers, lactic acidosis, tachycardia, bacteruria c/w likley urosepsis by ER labs 12/15/14. Admitted to [facility], placed on empiric gent + rocephin. Fevers and tachycardia resolved and transferred to med-surg floor 12/154. By 12/15, the day of discharge, he is afebrile x 24 hrs,  UCX from nephrostomy confirmed pan-sensitive e. Coli, and felt to be adequate for discharge.   Consults: critical care  Significant Diagnostic Studies: labs: UCX and CT as per above  Treatments: antibiotics, right nephrostomy tube  Discharge Exam: Blood pressure 130/89, pulse 96, temperature 98.1 F (36.7 C), temperature source Oral, resp. rate 20, height 5\' 11"  (1.803 m), weight 114 kg (251 lb 5.2 oz), SpO2 96 %. General appearance: alert, cooperative, appears stated age and at baseline Eyes: negative Nose: Nares normal. Septum midline. Mucosa normal. No drainage or sinus tenderness. Throat: lips, mucosa, and tongue normal; teeth and gums normal Neck: supple, symmetrical, trachea midline Back: symmetric, no curvature. ROM normal. No CVA tenderness., rt neph tube c/d/i with clear urine.  Resp: non-labored on room air.  Cardio: Nl rate GI: soft, non-tender; bowel sounds normal; no masses,  no organomegaly and RLQ Urostomy pink and patent of clear urine. Male genitalia:  normal Extremities: extremities normal, atraumatic, no cyanosis or edema Pulses: 2+ and symmetric Skin: Skin color, texture, turgor normal. No rashes or lesions Lymph nodes: Cervical, supraclavicular, and axillary nodes normal.  Disposition: Home    Medication List    TAKE these medications        atorvastatin 40 MG tablet  Commonly known as:  LIPITOR  Take 40 mg by mouth.     naproxen sodium 220 MG tablet  Commonly known as:  ANAPROX  Take 440 mg by mouth 2 (two) times daily with a meal.     pantoprazole 40 MG tablet  Commonly known as:  PROTONIX  Take 40 mg by mouth daily.     sulfamethoxazole-trimethoprim 800-160 MG tablet  Commonly known as:  BACTRIM DS,SEPTRA DS  Take 1 tablet by mouth 2 (two) times daily. X 7 days now. Also begin 3 days before next Urology surgery     traMADol 50 MG tablet  Commonly known as:  ULTRAM  Take 1 tablet (50 mg total) by mouth every 6 (six) hours as needed for moderate pain.     Vitamin D (Ergocalciferol) 50000 UNITS Caps capsule  Commonly known as:  DRISDOL  Take by mouth every 7 (seven) days.           Follow-up Information    Follow up with Alexis Frock, MD.   Specialty:  Urology   Why:  we will call to arrange stone removal surgery   Contact information:   [place] [address] [phone]       Signed: Alexis Frock 12/17/2014, 4:55 PM

## 2014-12-18 ENCOUNTER — Other Ambulatory Visit: Payer: Self-pay | Admitting: Urology

## 2014-12-18 LAB — URINE CULTURE

## 2014-12-20 LAB — CULTURE, BLOOD (ROUTINE X 2)
CULTURE: NO GROWTH
Culture: NO GROWTH

## 2014-12-30 NOTE — Patient Instructions (Addendum)
Chad Olson  12/30/2014   Your procedure is scheduled on: 01-06-15  Report to [facility]  Entrance take [facility]  elevators to 3rd floor to  [name] at  10:00 AM.  Call this number if you have problems the morning of surgery [phone]   Remember: ONLY 1 PERSON MAY GO WITH YOU TO SHORT STAY TO GET  READY MORNING OF [name].  Do not eat food or drink liquids :After Midnight.     Take these medicines the morning of surgery with A SIP OF WATER: Protonix, Bactrim DS/Septra DS DO NOT TAKE ANY DIABETIC MEDICATIONS DAY OF YOUR SURGERY                               You may not have any metal on your body including hair pins and              piercings  Do not wear jewelry,  lotions, powders or perfumes, deodorant                         Men may shave face and neck.   Do not bring valuables to the hospital. [name].  Contacts, dentures or bridgework may not be worn into surgery.  Leave suitcase in the car. After surgery it may be brought to your room.        Special Instructions: coughing and deep breathing exercises, leg exercises              Please read over the following fact sheets you were given: _____________________________________________________________________                                              _____________________________________________________________________             [name] - Preparing for Surgery Before surgery, you can play an important role.  Because skin is not sterile, your skin needs to be as free of germs as possible.  You can reduce the number of germs on your skin by washing with CHG (chlorahexidine gluconate) soap before surgery.  CHG is an antiseptic cleaner which kills germs and bonds with the skin to continue killing germs even after washing. Please DO NOT use if you have an allergy to CHG or antibacterial soaps.   If your skin becomes reddened/irritated stop using the CHG and inform your nurse when you arrive at Short Stay. Do not shave (including legs and underarms) for at least 48 hours prior to the first CHG shower.  You may shave your face/neck. Please follow these instructions carefully:  1.  Shower with CHG Soap the night before surgery and the  morning of Surgery.  2.  If you choose to wash your hair, wash your hair first as usual with your  normal  shampoo.  3.  After you shampoo, rinse your hair and body thoroughly to remove the  shampoo.  4.  Use CHG as you would any other liquid soap.  You can apply chg directly  to the skin and wash                       Gently with a scrungie or clean washcloth.  5.  Apply the CHG Soap to your body ONLY FROM THE NECK DOWN.   Do not use on face/ open                           Wound or open sores. Avoid contact with eyes, ears mouth and genitals (private parts).                       Wash face,  Genitals (private parts) with your normal soap.             6.  Wash thoroughly, paying special attention to the area where your surgery  will be performed.  7.  Thoroughly rinse your body with warm water from the neck down.  8.  DO NOT shower/wash with your normal soap after using and rinsing off  the CHG Soap.                9.  Pat yourself dry with a clean towel.            10.  Wear clean pajamas.            11.  Place clean sheets on your bed the night of your first shower and do not  sleep with pets. Day of Surgery : Do not apply any lotions/deodorants the morning of surgery.  Please wear clean clothes to the hospital/surgery center.  FAILURE TO FOLLOW THESE INSTRUCTIONS MAY RESULT IN THE CANCELLATION OF YOUR SURGERY PATIENT SIGNATURE_________________________________  NURSE SIGNATURE__________________________________  ________________________________________________________________________

## 2014-12-31 ENCOUNTER — Encounter (HOSPITAL_COMMUNITY): Payer: Self-pay

## 2014-12-31 ENCOUNTER — Encounter (HOSPITAL_COMMUNITY)
Admission: RE | Admit: 2014-12-31 | Discharge: 2014-12-31 | Disposition: A | Discharge status: Home / Self Care | Payer: Medicare HMO | Source: Ambulatory Visit | Attending: Urology | Admitting: Urology

## 2014-12-31 DIAGNOSIS — Z01818 Encounter for other preprocedural examination: Secondary | ICD-10-CM | POA: Insufficient documentation

## 2014-12-31 DIAGNOSIS — N201 Calculus of ureter: Secondary | ICD-10-CM | POA: Diagnosis not present

## 2014-12-31 LAB — CBC
HEMATOCRIT: 47.2 % (ref 39.0–52.0)
HEMOGLOBIN: 15.4 g/dL (ref 13.0–17.0)
MCH: 29.8 pg (ref 26.0–34.0)
MCHC: 32.6 g/dL (ref 30.0–36.0)
MCV: 91.5 fL (ref 78.0–100.0)
PLATELETS: 276 10*3/uL (ref 150–400)
RBC: 5.16 MIL/uL (ref 4.22–5.81)
RDW: 15.1 % (ref 11.5–15.5)
WBC: 6.8 10*3/uL (ref 4.0–10.5)

## 2014-12-31 LAB — BASIC METABOLIC PANEL
ANION GAP: 11 (ref 5–15)
BUN: 22 mg/dL — ABNORMAL HIGH (ref 6–20)
CHLORIDE: 109 mmol/L (ref 101–111)
CO2: 23 mmol/L (ref 22–32)
Calcium: 9.1 mg/dL (ref 8.9–10.3)
Creatinine, Ser: 1.56 mg/dL — ABNORMAL HIGH (ref 0.61–1.24)
GFR calc Af Amer: 51 mL/min — ABNORMAL LOW (ref >60)
GFR, EST NON AFRICAN AMERICAN: 44 mL/min — AB (ref >60)
GLUCOSE: 108 mg/dL — AB (ref 65–99)
POTASSIUM: 4.2 mmol/L (ref 3.5–5.1)
Sodium: 143 mmol/L (ref 135–145)

## 2014-12-31 NOTE — Progress Notes (Addendum)
12-15-14 - ED - Right nephrostomy tube placed - EPIC 12-15-14 - PCR screen -Neg - EPIC 12-15-14 - Urine culture - EPIC 12-15-14 - PT and PTT - EPIC 12-15-14 - R. Nephrostomy tube placed - EPIC 12-16-14 - BMP & CBC - EPIC  11-16-14 - LOV - S.Loyal Buba, Laie in EPIC  09-11-14 - LOV - D. Curtice, FNP ([place] in EPIC  04-02-14 - 2V CXR - EPIC  06-03-12 - Lower Ext. Venous Duplex - EPIC 05-15-12 - 2D Echo - EPIC

## 2014-12-31 NOTE — Progress Notes (Signed)
12-31-14 - BMP lab results from preop visit faxed to Dr. Tresa Moore [facility] urology) via [facility]

## 2014-12-31 NOTE — Progress Notes (Signed)
   12/31/14 0934  OBSTRUCTIVE SLEEP APNEA  Have you ever been diagnosed with sleep apnea through a sleep study? No  Do you snore loudly (loud enough to be heard through closed doors)?  1  Do you often feel tired, fatigued, or sleepy during the daytime (such as falling asleep during driving or talking to someone)? 0  Has anyone observed you stop breathing during your sleep? 1 (in ER a couple of weeks ago)  Do you have, or are you being treated for high blood pressure? 1  BMI more than 35 kg/m2? 1  Age > 50 (1-yes) 1  Neck circumference greater than:Male 16 inches or larger, Male 17inches or larger? 0  Male Gender (Yes=1) 1  Obstructive Sleep Apnea Score 6

## 2015-01-01 LAB — HEMOGLOBIN A1C
HEMOGLOBIN A1C: 5.9 % — AB (ref 4.8–5.6)
MEAN PLASMA GLUCOSE: 123 mg/dL

## 2015-01-05 MED ORDER — GENTAMICIN SULFATE 40 MG/ML IJ SOLN
5.0000 mg/kg | INTRAVENOUS | Status: AC
  Administered 2015-01-06: 451.6 mg via INTRAVENOUS
  Filled 2015-01-05 (×2): qty 11.25

## 2015-01-06 ENCOUNTER — Encounter (HOSPITAL_COMMUNITY)
Admission: RE | Disposition: A | Discharge status: Home / Self Care | Payer: Self-pay | Source: Ambulatory Visit | Attending: Urology

## 2015-01-06 ENCOUNTER — Ambulatory Visit (HOSPITAL_COMMUNITY): Payer: Medicare HMO

## 2015-01-06 ENCOUNTER — Observation Stay (HOSPITAL_COMMUNITY)
Admission: RE | Admit: 2015-01-06 | Discharge: 2015-01-07 | Disposition: A | Discharge status: Home / Self Care | Payer: Medicare HMO | Source: Ambulatory Visit | Attending: Urology | Admitting: Urology

## 2015-01-06 ENCOUNTER — Encounter (HOSPITAL_COMMUNITY): Payer: Self-pay | Admitting: Certified Registered Nurse Anesthetist

## 2015-01-06 ENCOUNTER — Ambulatory Visit (HOSPITAL_COMMUNITY): Payer: Medicare HMO | Admitting: Certified Registered Nurse Anesthetist

## 2015-01-06 ENCOUNTER — Observation Stay (HOSPITAL_COMMUNITY): Payer: Medicare HMO

## 2015-01-06 DIAGNOSIS — Z906 Acquired absence of other parts of urinary tract: Secondary | ICD-10-CM | POA: Diagnosis not present

## 2015-01-06 DIAGNOSIS — Z936 Other artificial openings of urinary tract status: Secondary | ICD-10-CM | POA: Diagnosis not present

## 2015-01-06 DIAGNOSIS — Z96652 Presence of left artificial knee joint: Secondary | ICD-10-CM | POA: Insufficient documentation

## 2015-01-06 DIAGNOSIS — Z8550 Personal history of malignant neoplasm of unspecified urinary tract organ: Secondary | ICD-10-CM | POA: Diagnosis not present

## 2015-01-06 DIAGNOSIS — I10 Essential (primary) hypertension: Secondary | ICD-10-CM | POA: Diagnosis not present

## 2015-01-06 DIAGNOSIS — N218 Other lower urinary tract calculus: Secondary | ICD-10-CM | POA: Insufficient documentation

## 2015-01-06 DIAGNOSIS — M1990 Unspecified osteoarthritis, unspecified site: Secondary | ICD-10-CM | POA: Insufficient documentation

## 2015-01-06 DIAGNOSIS — K219 Gastro-esophageal reflux disease without esophagitis: Secondary | ICD-10-CM | POA: Diagnosis not present

## 2015-01-06 DIAGNOSIS — Z86718 Personal history of other venous thrombosis and embolism: Secondary | ICD-10-CM | POA: Diagnosis not present

## 2015-01-06 DIAGNOSIS — E785 Hyperlipidemia, unspecified: Secondary | ICD-10-CM | POA: Diagnosis not present

## 2015-01-06 DIAGNOSIS — Z23 Encounter for immunization: Secondary | ICD-10-CM | POA: Diagnosis not present

## 2015-01-06 DIAGNOSIS — N200 Calculus of kidney: Secondary | ICD-10-CM

## 2015-01-06 DIAGNOSIS — E119 Type 2 diabetes mellitus without complications: Secondary | ICD-10-CM | POA: Insufficient documentation

## 2015-01-06 DIAGNOSIS — H9193 Unspecified hearing loss, bilateral: Secondary | ICD-10-CM | POA: Diagnosis not present

## 2015-01-06 DIAGNOSIS — N132 Hydronephrosis with renal and ureteral calculous obstruction: Principal | ICD-10-CM | POA: Diagnosis present

## 2015-01-06 DIAGNOSIS — N131 Hydronephrosis with ureteral stricture, not elsewhere classified: Secondary | ICD-10-CM | POA: Diagnosis not present

## 2015-01-06 DIAGNOSIS — Z6834 Body mass index (BMI) 34.0-34.9, adult: Secondary | ICD-10-CM | POA: Insufficient documentation

## 2015-01-06 HISTORY — PX: URETEROSCOPY WITH HOLMIUM LASER LITHOTRIPSY: SHX6645

## 2015-01-06 HISTORY — PX: HOLMIUM LASER APPLICATION: SHX5852

## 2015-01-06 LAB — GLUCOSE, CAPILLARY
GLUCOSE-CAPILLARY: 72 mg/dL (ref 65–99)
Glucose-Capillary: 100 mg/dL — ABNORMAL HIGH (ref 65–99)

## 2015-01-06 SURGERY — URETEROSCOPY, WITH LITHOTRIPSY USING HOLMIUM LASER
Anesthesia: General | Site: Ureter | Laterality: Right

## 2015-01-06 MED ORDER — CETYLPYRIDINIUM CHLORIDE 0.05 % MT LIQD: 7.0000 mL | Freq: Two times a day (BID) | OROMUCOSAL | Status: DC

## 2015-01-06 MED ORDER — LACTATED RINGERS IV SOLN
INTRAVENOUS | Status: DC
  Administered 2015-01-06: 1000 mL via INTRAVENOUS
  Administered 2015-01-06 (×2): via INTRAVENOUS

## 2015-01-06 MED ORDER — FENTANYL CITRATE (PF) 100 MCG/2ML IJ SOLN
INTRAMUSCULAR | Status: DC | PRN
  Administered 2015-01-06: 25 ug via INTRAVENOUS
  Administered 2015-01-06 (×3): 50 ug via INTRAVENOUS

## 2015-01-06 MED ORDER — LIDOCAINE HCL (CARDIAC) 20 MG/ML IV SOLN
INTRAVENOUS | Status: DC | PRN
  Administered 2015-01-06: 80 mg via INTRAVENOUS

## 2015-01-06 MED ORDER — ONDANSETRON HCL 4 MG/2ML IJ SOLN
INTRAMUSCULAR | Status: DC | PRN
  Administered 2015-01-06: 4 mg via INTRAVENOUS

## 2015-01-06 MED ORDER — SENNOSIDES-DOCUSATE SODIUM 8.6-50 MG PO TABS
1.0000 | ORAL_TABLET | Freq: Two times a day (BID) | ORAL | Status: DC
  Administered 2015-01-06 – 2015-01-07 (×2): 1 via ORAL
  Filled 2015-01-06 (×2): qty 1

## 2015-01-06 MED ORDER — PNEUMOCOCCAL VAC POLYVALENT 25 MCG/0.5ML IJ INJ
0.5000 mL | INJECTION | INTRAMUSCULAR | Status: AC
  Administered 2015-01-07: 0.5 mL via INTRAMUSCULAR
  Filled 2015-01-06 (×2): qty 0.5

## 2015-01-06 MED ORDER — IOHEXOL 300 MG/ML  SOLN
INTRAMUSCULAR | Status: DC | PRN
Start: 2015-01-06 — End: 2015-01-06
  Administered 2015-01-06: 100 mL

## 2015-01-06 MED ORDER — SODIUM CHLORIDE 0.9 % IR SOLN
Status: DC | PRN
  Administered 2015-01-06: 4000 mL

## 2015-01-06 MED ORDER — MIDAZOLAM HCL 5 MG/5ML IJ SOLN
INTRAMUSCULAR | Status: DC | PRN
  Administered 2015-01-06: 2 mg via INTRAVENOUS

## 2015-01-06 MED ORDER — PROPOFOL 10 MG/ML IV BOLUS
INTRAVENOUS | Status: AC
  Filled 2015-01-06: qty 20

## 2015-01-06 MED ORDER — ATORVASTATIN CALCIUM 40 MG PO TABS
40.0000 mg | ORAL_TABLET | Freq: Every day | ORAL | Status: DC
  Administered 2015-01-07: 40 mg via ORAL
  Filled 2015-01-06 (×2): qty 1

## 2015-01-06 MED ORDER — MIDAZOLAM HCL 2 MG/2ML IJ SOLN
INTRAMUSCULAR | Status: AC
  Filled 2015-01-06: qty 2

## 2015-01-06 MED ORDER — CHLORHEXIDINE GLUCONATE 0.12 % MT SOLN
15.0000 mL | Freq: Two times a day (BID) | OROMUCOSAL | Status: DC
  Administered 2015-01-06: 15 mL via OROMUCOSAL
  Filled 2015-01-06 (×2): qty 15

## 2015-01-06 MED ORDER — SODIUM CHLORIDE 0.9 % IV SOLN
INTRAVENOUS | Status: DC
  Administered 2015-01-06: 18:00:00 via INTRAVENOUS

## 2015-01-06 MED ORDER — GLYCOPYRROLATE 0.2 MG/ML IJ SOLN
INTRAMUSCULAR | Status: DC | PRN
  Administered 2015-01-06: 0.2 mg via INTRAVENOUS

## 2015-01-06 MED ORDER — FENTANYL CITRATE (PF) 250 MCG/5ML IJ SOLN
INTRAMUSCULAR | Status: AC
  Filled 2015-01-06: qty 5

## 2015-01-06 MED ORDER — PROPOFOL 10 MG/ML IV BOLUS
INTRAVENOUS | Status: DC | PRN
  Administered 2015-01-06: 180 mg via INTRAVENOUS

## 2015-01-06 MED ORDER — OXYCODONE HCL 5 MG/5ML PO SOLN: 5.0000 mg | Freq: Once | ORAL | Status: DC | PRN

## 2015-01-06 MED ORDER — OXYCODONE HCL 5 MG PO TABS
5.0000 mg | ORAL_TABLET | ORAL | Status: DC | PRN
  Administered 2015-01-06: 5 mg via ORAL
  Filled 2015-01-06: qty 1

## 2015-01-06 MED ORDER — HYDROMORPHONE HCL 1 MG/ML IJ SOLN: 0.2500 mg | INTRAMUSCULAR | Status: DC | PRN

## 2015-01-06 MED ORDER — OXYCODONE HCL 5 MG PO TABS: 5.0000 mg | ORAL_TABLET | Freq: Once | ORAL | Status: DC | PRN

## 2015-01-06 MED ORDER — PANTOPRAZOLE SODIUM 40 MG PO TBEC
40.0000 mg | DELAYED_RELEASE_TABLET | Freq: Every day | ORAL | Status: DC
  Administered 2015-01-07: 40 mg via ORAL
  Filled 2015-01-06: qty 1

## 2015-01-06 MED ORDER — MEPERIDINE HCL 50 MG/ML IJ SOLN: 6.2500 mg | INTRAMUSCULAR | Status: DC | PRN

## 2015-01-06 MED ORDER — FENTANYL CITRATE (PF) 100 MCG/2ML IJ SOLN
INTRAMUSCULAR | Status: AC
  Filled 2015-01-06: qty 2

## 2015-01-06 MED ORDER — ACETAMINOPHEN 500 MG PO TABS
1000.0000 mg | ORAL_TABLET | Freq: Four times a day (QID) | ORAL | Status: DC
  Administered 2015-01-06 – 2015-01-07 (×2): 1000 mg via ORAL
  Filled 2015-01-06 (×4): qty 2

## 2015-01-06 MED ORDER — INFLUENZA VAC SPLIT QUAD 0.5 ML IM SUSY
0.5000 mL | PREFILLED_SYRINGE | INTRAMUSCULAR | Status: AC
  Administered 2015-01-07: 0.5 mL via INTRAMUSCULAR
  Filled 2015-01-06 (×2): qty 0.5

## 2015-01-06 SURGICAL SUPPLY — 33 items
APL SKNCLS STERI-STRIP NONHPOA (GAUZE/BANDAGES/DRESSINGS) ×2
BASKET LASER NITINOL 1.9FR (BASKET) ×2 IMPLANT
BENZOIN TINCTURE PRP APPL 2/3 (GAUZE/BANDAGES/DRESSINGS) ×2 IMPLANT
BSKT STON RTRVL 120 1.9FR (BASKET) ×2
CATH INTERMIT  6FR 70CM (CATHETERS) ×2 IMPLANT
CATH URET DUAL LUMEN 6-10FR 50 (CATHETERS) ×2 IMPLANT
CATH UROLOGY TORQUE 40 (MISCELLANEOUS) ×2 IMPLANT
DRAPE C-ARM 42X120 X-RAY (DRAPES) ×2 IMPLANT
DRAPE INCISE IOBAN 66X45 STRL (DRAPES) ×6 IMPLANT
DRAPE LG THREE QUARTER DISP (DRAPES) ×2 IMPLANT
DRAPE LINGEMAN PERC (DRAPES) ×4 IMPLANT
DRSG TEGADERM 4X4.75 (GAUZE/BANDAGES/DRESSINGS) ×2 IMPLANT
FIBER LASER FLEXIVA 1000 (UROLOGICAL SUPPLIES) IMPLANT
FIBER LASER FLEXIVA 200 (UROLOGICAL SUPPLIES) IMPLANT
FIBER LASER FLEXIVA 365 (UROLOGICAL SUPPLIES) IMPLANT
FIBER LASER FLEXIVA 550 (UROLOGICAL SUPPLIES) IMPLANT
FIBER LASER TRAC TIP (UROLOGICAL SUPPLIES) ×2 IMPLANT
GAUZE SPONGE 4X4 12PLY STRL (GAUZE/BANDAGES/DRESSINGS) ×2 IMPLANT
GLOVE BIOGEL M STRL SZ7.5 (GLOVE) ×6 IMPLANT
GOWN STRL REUS W/ TWL LRG LVL3 (GOWN DISPOSABLE) IMPLANT
GOWN STRL REUS W/TWL LRG LVL3 (GOWN DISPOSABLE) ×8
GUIDEWIRE AMPLAZ .035X145 (WIRE) ×4 IMPLANT
GUIDEWIRE ANG ZIPWIRE 038X150 (WIRE) ×2 IMPLANT
GUIDEWIRE STR DUAL SENSOR (WIRE) ×2 IMPLANT
KIT BALLN UROMAX 15FX4 (MISCELLANEOUS) IMPLANT
KIT BALLN UROMAX 26 75X4 (MISCELLANEOUS) ×2
MANIFOLD NEPTUNE II (INSTRUMENTS) ×2 IMPLANT
PACK CYSTO (CUSTOM PROCEDURE TRAY) ×2 IMPLANT
SHEATH ACCESS URETERAL 24CM (SHEATH) ×2 IMPLANT
STENT SINGLE 7F (STENTS) ×2 IMPLANT
SYR 20CC LL (SYRINGE) ×6 IMPLANT
TOWEL NATURAL 10PK STERILE (DISPOSABLE) ×2 IMPLANT
TUBING IRRIGATION (MISCELLANEOUS) ×4 IMPLANT

## 2015-01-06 NOTE — Anesthesia Procedure Notes (Signed)
Procedure Name: LMA Insertion Performed by: Leeyah Heather J Pre-anesthesia Checklist: Patient identified, Emergency Drugs available, Suction available, Patient being monitored and Timeout performed Patient Re-evaluated:Patient Re-evaluated prior to inductionOxygen Delivery Method: Circle system utilized Preoxygenation: Pre-oxygenation with 100% oxygen Intubation Type: IV induction Ventilation: Mask ventilation without difficulty LMA: LMA inserted LMA Size: 4.0 Number of attempts: 1 Placement Confirmation: positive ETCO2,  CO2 detector and breath sounds checked- equal and bilateral Tube secured with: Tape Dental Injury: Teeth and Oropharynx as per pre-operative assessment        

## 2015-01-06 NOTE — Transfer of Care (Signed)
Immediate Anesthesia Transfer of Care Note  Patient: BENZ CHANCE  Procedure(s) Performed: Procedure(s): antegrade ureteroscopy laser lithotripsy ballon dilation right ureteral stricture antegrade nephrostogram right ureteral stent placement (Right) HOLMIUM LASER APPLICATION (Right)  Patient Location: PACU  Anesthesia Type:General  Level of Consciousness: sedated, patient cooperative and responds to stimulation  Airway & Oxygen Therapy: Patient Spontanous Breathing and Patient connected to face mask oxygen  Post-op Assessment: Report given to RN and Post -op Vital signs reviewed and stable  Post vital signs: Reviewed and stable  Last Vitals:  Filed Vitals:   01/06/15 0939  BP: 149/99  Pulse: 75  Temp: 36.5 C  Resp: 18    Complications: No apparent anesthesia complications
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
## 2015-01-06 NOTE — Anesthesia Postprocedure Evaluation (Signed)
Anesthesia Post Note  Patient: [name]  Procedure(s) Performed: Procedure(s) (LRB): antegrade ureteroscopy laser lithotripsy ballon dilation right ureteral stricture antegrade nephrostogram right ureteral stent placement (Right) HOLMIUM LASER APPLICATION (Right)  Patient location during evaluation: PACU Anesthesia Type: General Level of consciousness: awake and alert Pain management: pain level controlled Vital Signs Assessment: post-procedure vital signs reviewed and stable Respiratory status: spontaneous breathing, nonlabored ventilation, respiratory function stable and patient connected to nasal cannula oxygen Cardiovascular status: blood pressure returned to baseline and stable Postop Assessment: no signs of nausea or vomiting Anesthetic complications: no    Last Vitals:  Filed Vitals:   01/06/15 1615 01/06/15 1630  BP: 124/78 158/97  Pulse: 57 57  Temp:    Resp: 13 11    Last Pain:  Filed Vitals:   01/06/15 1639  PainSc: 0-No pain                 Xenia Nile J

## 2015-01-06 NOTE — Anesthesia Preprocedure Evaluation (Addendum)
Anesthesia Evaluation  Patient identified by MRN, date of birth, ID band Patient awake    Reviewed: Allergy & Precautions, NPO status , Patient's Chart, lab work & pertinent test results  Airway Mallampati: II  TM Distance: >3 FB Neck ROM: Full    Dental  (+) Teeth Intact, Dental Advisory Given   Pulmonary    breath sounds clear to auscultation       Cardiovascular hypertension,  Rhythm:Regular Rate:Normal     Neuro/Psych    GI/Hepatic GERD  Medicated and Controlled,  Endo/Other  diabetes, Well ControlledMorbid obesity  Renal/GU      Musculoskeletal   Abdominal   Peds  Hematology   Anesthesia Other Findings   Reproductive/Obstetrics                           Anesthesia Physical Anesthesia Plan  ASA: III  Anesthesia Plan: General   Post-op Pain Management:    Induction: Intravenous  Airway Management Planned: LMA  Additional Equipment:   Intra-op Plan:   Post-operative Plan: Extubation in OR  Informed Consent: I have reviewed the patients History and Physical, chart, labs and discussed the procedure including the risks, benefits and alternatives for the proposed anesthesia with the patient or authorized representative who has indicated his/her understanding and acceptance.   Dental advisory given  Plan Discussed with: CRNA, Anesthesiologist and Surgeon  Anesthesia Plan Comments:         Anesthesia Quick Evaluation

## 2015-01-06 NOTE — Brief Op Note (Signed)
01/06/2015  3:34 PM  PATIENT:  Chad Olson  70 y.o. male  PRE-OPERATIVE DIAGNOSIS:  RIGHT URETERAL STONE HISTORY OF ILEAL CONDUIT  POST-OPERATIVE DIAGNOSIS:  right ureteral stone  PROCEDURE:  Procedure(s): antegrade ureteroscopy laser lithotripsy ballon dilation right ureteral stricture antegrade nephrostogram right ureteral stent placement (Right) HOLMIUM LASER APPLICATION (Right)  SURGEON:  Surgeon(s) and Role:    * Alexis Frock, MD - Primary  PHYSICIAN ASSISTANT:   ASSISTANTS: none   ANESTHESIA:   general  EBL:  Total I/O In: 2000 [I.V.:2000] Out: -   BLOOD ADMINISTERED:none  DRAINS: Urostomy wtih single Rt ureteral stent (single J) to gravity.   LOCAL MEDICATIONS USED:  NONE  SPECIMEN:  Source of Specimen:  Rt ureteral and conduit stones  DISPOSITION OF SPECIMEN:  [facility] for compositional analysis  COUNTS:  YES  TOURNIQUET:  * No tourniquets in log *  DICTATION: .Other Dictation: Dictation Number [id number]  PLAN OF CARE: Admit for overnight observation  PATIENT DISPOSITION:  PACU - hemodynamically stable.   Delay start of Pharmacological VTE agent (>24hrs) due to surgical blood loss or risk of bleeding: not applicable

## 2015-01-07 ENCOUNTER — Encounter (HOSPITAL_COMMUNITY): Payer: Self-pay | Admitting: Urology

## 2015-01-07 DIAGNOSIS — N132 Hydronephrosis with renal and ureteral calculous obstruction: Secondary | ICD-10-CM | POA: Diagnosis not present

## 2015-01-07 LAB — HEMOGLOBIN AND HEMATOCRIT, BLOOD
HCT: 46.3 % (ref 39.0–52.0)
HEMOGLOBIN: 14.6 g/dL (ref 13.0–17.0)

## 2015-01-07 LAB — BASIC METABOLIC PANEL
Anion gap: 10 (ref 5–15)
BUN: 16 mg/dL (ref 6–20)
CALCIUM: 8.9 mg/dL (ref 8.9–10.3)
CHLORIDE: 102 mmol/L (ref 101–111)
CO2: 27 mmol/L (ref 22–32)
CREATININE: 1.85 mg/dL — AB (ref 0.61–1.24)
GFR calc Af Amer: 41 mL/min — ABNORMAL LOW (ref >60)
GFR calc non Af Amer: 36 mL/min — ABNORMAL LOW (ref >60)
Glucose, Bld: 94 mg/dL (ref 65–99)
Potassium: 4.5 mmol/L (ref 3.5–5.1)
SODIUM: 139 mmol/L (ref 135–145)

## 2015-01-07 NOTE — Op Note (Signed)
NAME:  Chad Olson, Chad Olson NO.:  [id number]  MEDICAL RECORD NO.:  [id number]  LOCATION:  37                         FACILITY:  [facility]  PHYSICIAN:  Alexis Frock, MD     DATE OF BIRTH:  Oct 09, 1945  DATE OF PROCEDURE: 01/06/2015                               OPERATIVE REPORT   DIAGNOSES:  Right ureteral stone, ileal conduit stones, history of radical cystectomy.  PROCEDURE: 1. Right antegrade ureteroscopy with laser lithotripsy. 2. Right antegrade nephrostogram with interpretation. 3. Insertion of right ureteral stent, single J exiting the ileal     conduit. 4. Balloon dilation of right ureteral stricture.  ESTIMATED BLOOD LOSS:  Nil.  COMPLICATIONS:  None.  SPECIMENS:  Right ureteral conduit stones for compositional analysis.  FINDINGS: 1. Multifocal right distal ureteral stones just above somewhat     narrowed ureteroileal junction approximately 1.5 cm total. 2. Complete resolution of all stone fragments closely 1/3rd mm     following holmium laser lithotripsy and basket extraction. 3. Butt end of conduit stones approximately 1 cm total volume with     resolution of following basket extraction and laser lithotripsy. 4. Relative narrowing of the right ureteroileal anastomosis     approximately 6-French free pre-dilation, 15-French post-dilation. 5. Wide open visually latent left ureteroileal anastomosis, endoscopy     with conduit.  INDICATION:  [name] is a 70 year old gentleman with complicated urologic history including a history of radical cystoprostatectomy, ileal conduit urinary diversion several years ago for refractory high- grade urothelial carcinoma.  He has been compliant with surveillance and made disease free.  He unfortunately recently developed right ureteral stone and urosepsis at which point, he had nephrostomy tube placed.  He was admitted for IV antibiotics.  He was found to have pansensitive E. coli and was further treated for this  on an outpatient basis and continued oral antibiotics.  He has since cleared his infectious parameters systemically.  Review of his imaging revealed distal ureteral stone burden on the right as well as some calcifications likely in the butt end of his conduit and it was felt that antegrade ureteroscopy would be the most advantageous awaited addresses ureteral stones simultaneous endoscopy of his conduit to addresses his conduit stones, and he wished to proceed.  Informed consent was obtained and placed in medical record.  PROCEDURE IN DETAIL:  The patient being Chad Olson was verified. Procedure being right antegrade ureteroscopy and endoscopy of conduit with laser lithotripsy was confirmed.  Procedure was carried out.  Time- out was performed.  Intravenous antibiotics were administered.  General endotracheal anesthesia introduced.  The patient was placed into a right side up partial flank position with body approximately 45 degrees to the table.  He was fashioned in his position using OrthoGel roll on his back, a small axillary roll.  Two pillows were placed between his legs, bottom leg bent and top leg straight.  He was further fashioned to the operative table using 3-inch tape over foam padding.  His superior arm was brought anteriorly across his chest and padded with the pillow. This positioning allowed simultaneous access to his right nephrostomy tube site as well as his urostomy.  Sterile field was created,  which incorporated his in situ nephrostomy, which was capped prior to prepping and his conduit and two separate percutaneous drapes were applied, one of the nephroscopy tube site and another of the conduit site.  Next, antegrade nephrostogram was obtained.  Right antegrade nephrostogram revealed a single system right kidney with modestly dilated ureter to a filling defect in the distal ureter consistent with known stone.  There was no obvious efflux contrast from this  location to the conduit and then cleared this with this represented impaction of stone versus stricturing.  A 0.038 Zip wire was advanced via the nephrostomy tube down and coiled in the distal most ureter.  It was exchanged via an Imager catheter for a Super stiff wire and a 24 cm 12/14 ureteral access sheath was carefully placed using continuous fluoroscopic guidance in an antegrade fashion to the level of the distal third of the ureter.  Using continuous fluoroscopic guidance over a separate Super Stiff wire that was placed using a dual-axial introducer. Next, antegrade ureteroscopy was performed.  This revealed as expected approximately 1.5 cm total volume distal ureteral stone just above the ureteroileal anastomotic site.  This appeared to be much too large for simple basketing.  As such, holmium laser energy applied to the stone using settings of 0.2 joules and 10 Hz.  The 2 nanometer fiber fragmenting the stone into much smaller manageable pieces approximately 2 mm each.  These were sequentially grasped with escape type basket, removed, and set aside for compositional analysis.  This allowed visualization of the ureteroileal anastomotic site, which appeared to be somewhat narrow, the visibly patent and this did easily allow passage of 2 wires and it was estimated to be approximately 6-French.  The ileal site was visible.  The butt end of the conduit was visible and no obvious stone impaction between the 2 areas were noted.  It was felt that dilation of this would be warranted to prevent future impaction of stones of this site, and through and through access was obtained via Super Stiff wire entering the nephrostomy tubes site and exiting the conduit site.  Attention was directed inferiorly at endoscopy of conduit.  It was performed using a 16-French flexible cystoscope and normal saline irrigation without pressure.  Inspection of the conduit revealed no papular lesions in the butt  end of the conduit.  There were several small loose stones noted.  These were amenable to simple basketing.  They removed in its entirety and set aside for compositional analysis.  Another stone approximately 8 mm in diameter was adherent to a single small staple.  This was removed with the staples as well set aside for compositional analysis.  The right ureteroileal anastomotic site was clearly visualized from the conduit side and also appeared to be relatively narrow, but patent.  The left anastomotic site was wide open.  We had successfully cleared the conduit of stones.  Attention was then directed to dilation of this anastomotic stricture.  Using antegrade, placed NephroMax ureteral dilation apparatus, 15-French diameter by 4 cm in length, this carefully positioned across the ureteroileal anastomotic site on the right.  Using continuous fluoroscopic vision, the balloon was inflated to pressure of 18 atmospheres held for 90 seconds.  There was minimal wasting suggesting relatively soft strictured area that easily was dilated to 15-French via this.  Next, the ureteroileal anastomotic site was once again inspected in antegrade fashion, easily accommodated the ureteroscope.  Hemostasis appeared excellent.  There was no residual stone material noted.  Access sheath  was removed under continuous ureteroscopic vision, leaving 2 wires in situ.  A single J stent was then placed over one of the through and through wires from the conduit side coiling the curl in the renal pelvis as verified by retrograde pyelogram.  This was sutured in place to inadvertent removal using a Vicryl stitch just lateral to the bowel mucosa of the conduit and skin level.  The final safety wire was removed.  Hemostasis appeared excellent.  There was no obvious extravasation via final retrograde pyelogram.  We achieved the goals of procedure today.  Visible clear urine was seen around into the distal end of the stent.   The prior nephrostomy tube site was closed using interrupted 4-0 Vicryl at the level of skin and dry dressing was applied.  A new urostomy appliance was applied and procedure was terminated.  The patient tolerated the procedure well with no immediate periprocedural complications.  The patient was taken to the postanesthesia care unit in stable condition.          ______________________________ Alexis Frock, MD     TM/MEDQ  D:  01/06/2015  T:  01/06/2015  Job:  [id number]

## 2015-01-07 NOTE — Discharge Summary (Signed)
Physician Discharge Summary  Patient ID: KENECHUKWU CONROY MRN: [id number] DOB/AGE: 1945/05/27 70 y.o.  Admit date: 01/06/2015 Discharge date: 01/07/2015  Admission Diagnoses: Right Ureteral and Conduit Stones  Discharge Diagnoses:  Active Problems:   Ureteral stone with hydronephrosis   Discharged Condition: good  Hospital Course:   Right Ureteral and Conduit Stones - underwent right antegrade ureteroscopic stone maniulation / last lithotripsy with balloon dilation partial ureteral-ileal stricture and endoscopy of conduit / removal of stones on 01/06/15, the day of admission, without acute complications. Single J ureteral stent exiting conduit left in place after surgery. Observed overnight. By POD 1, the day of discharge, pain controlled on PO meds, Hgb and Cr acceptable, ambulatory, and felt to be adequate for discharge.  Consults: None  Significant Diagnostic Studies: labs: Hgb 14.6, Cr 1.85  Treatments: surgery: right antegrade ureteroscopic stone maniulation / last lithotripsy with balloon dilation partial ureteral-ileal stricture and endoscopy of conduit / removal of stones on 01/06/15  Discharge Exam: Blood pressure 113/66, pulse 68, temperature 98 F (36.7 C), temperature source Oral, resp. rate 16, height 5\' 11"  (1.803 m), weight 112.946 kg (249 lb), SpO2 95 %. General appearance: alert, cooperative, appears stated age and wife at bedside, at baseline Head: Normocephalic, without obvious abnormality, atraumatic, hard of hearing Eyes: negative Nose: Nares normal. Septum midline. Mucosa normal. No drainage or sinus tenderness. Throat: lips, mucosa, and tongue normal; teeth and gums normal Neck: supple, symmetrical, trachea midline Back: symmetric, no curvature. ROM normal. No CVA tenderness. Resp: non-labored on room air Cardio: Nl rate GI: soft, non-tender; bowel sounds normal; no masses,  no organomegaly and RLQ Urostomy pink / patent with distal single J stent in situ.  Male  genitalia: normal Extremities: extremities normal, atraumatic, no cyanosis or edema Pulses: 2+ and symmetric Lymph nodes: Cervical, supraclavicular, and axillary nodes normal. Neurologic: Grossly normal Incision/Wound: Recent Rt neph tube site c/d/i with dry dressing in place.   Disposition: 01-Home or Self Care     Medication List    STOP taking these medications        sulfamethoxazole-trimethoprim 800-160 MG tablet  Commonly known as:  BACTRIM DS,SEPTRA DS      TAKE these medications        atorvastatin 40 MG tablet  Commonly known as:  LIPITOR  Take 40 mg by mouth daily.     naproxen sodium 220 MG tablet  Commonly known as:  ANAPROX  Take 440 mg by mouth 2 (two) times daily as needed (pain).     pantoprazole 40 MG tablet  Commonly known as:  PROTONIX  Take 40 mg by mouth daily.     PROBIOTIC DAILY PO  Take 1 capsule by mouth daily.     traMADol 50 MG tablet  Commonly known as:  ULTRAM  Take 1 tablet (50 mg total) by mouth every 6 (six) hours as needed for moderate pain.     Vitamin D (Ergocalciferol) 50000 units Caps capsule  Commonly known as:  DRISDOL  Take 50,000 Units by mouth every 7 (seven) days. Fridays           Follow-up Information    Follow up with Alexis Frock, MD On 01/21/2015.   Specialty:  Urology   Why:  at 10:45 AM for MD visit and ureteral stent removal. Plese bring an extra ostomy pouch to this visit.   Contact information:   [place] [address] [phone]       Signed: Alexis Frock 01/07/2015, 7:29  AM

## 2015-01-07 NOTE — Progress Notes (Signed)
Pt and his wife given discharge, medication and follow up instructions, verbalized understanding, IV removed, flu and pneumonia vaccines given, pt's wife to transport home

## 2015-01-12 ENCOUNTER — Encounter (HOSPITAL_COMMUNITY): Payer: Self-pay | Admitting: Urology

## 2015-12-31 ENCOUNTER — Other Ambulatory Visit: Payer: Self-pay | Admitting: Nurse Practitioner

## 2016-06-15 ENCOUNTER — Other Ambulatory Visit: Payer: Self-pay | Admitting: Urology

## 2016-06-15 ENCOUNTER — Ambulatory Visit (HOSPITAL_COMMUNITY)
Admission: RE | Admit: 2016-06-15 | Discharge: 2016-06-15 | Disposition: A | Discharge status: Home / Self Care | Payer: Medicare HMO | Source: Ambulatory Visit | Attending: Urology | Admitting: Urology

## 2016-06-15 DIAGNOSIS — C670 Malignant neoplasm of trigone of bladder: Secondary | ICD-10-CM | POA: Diagnosis not present

## 2016-12-20 IMAGING — RF DG RETROGRADE PYELOGRAM
1 series · 12 of 12 positions shown · non-contrast
Comparison: CT abdomen pelvis - 12/15/2014

CLINICAL DATA: Right-sided distal ureteral stone removal and stent
placement.

EXAM:
DG C-ARM 61-120 MIN-NO REPORT; RETROGRADE PYELOGRAM
FLUOROSCOPY TIME:  7 minutes, 42 seconds

[Series 1: run · 12 of 12 slices shown]
[im 1/12]
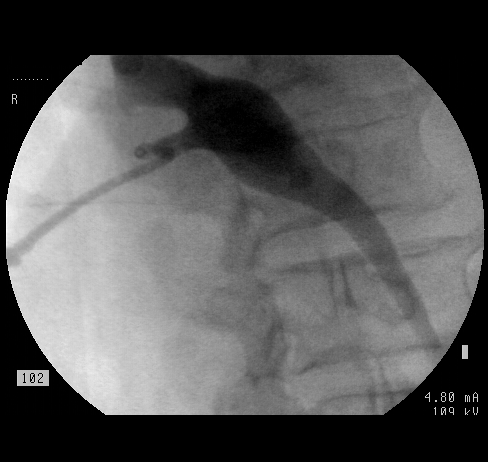
[im 2/12]
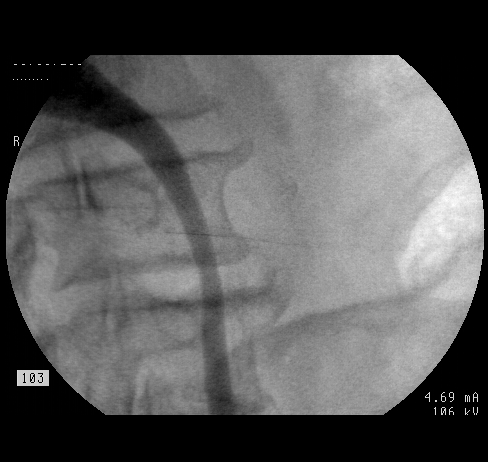
[im 3/12]
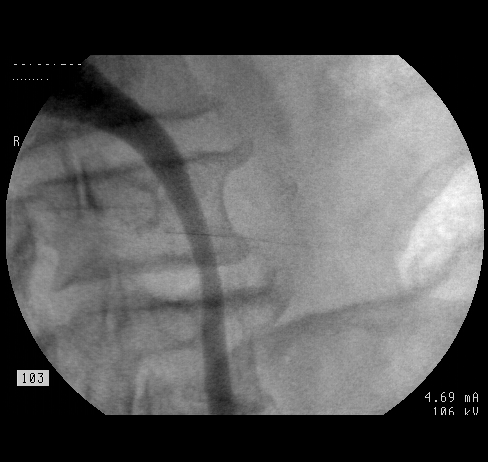
[im 4/12]
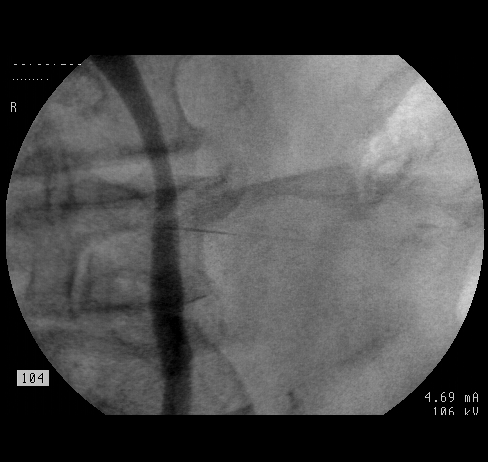
[im 5/12]
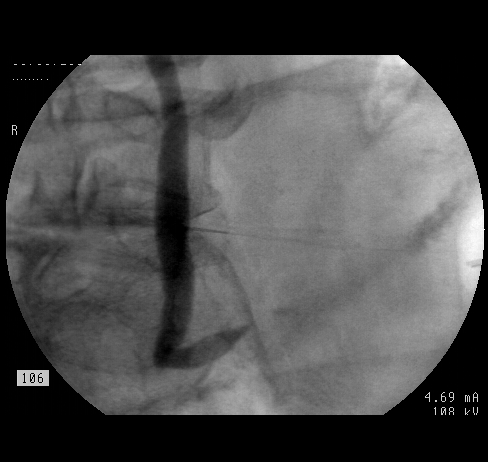
[im 6/12]
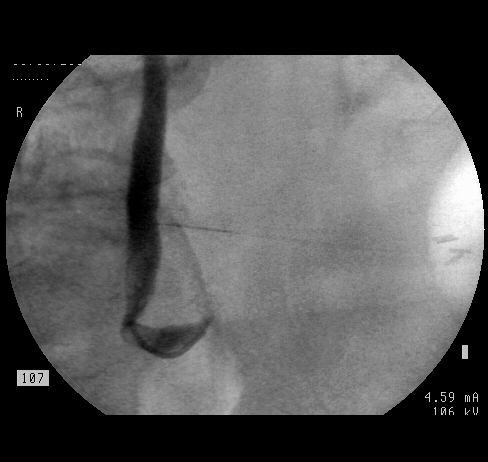
[im 7/12]
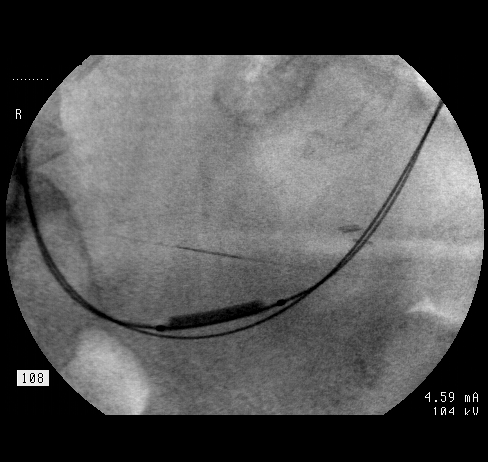
[im 8/12]
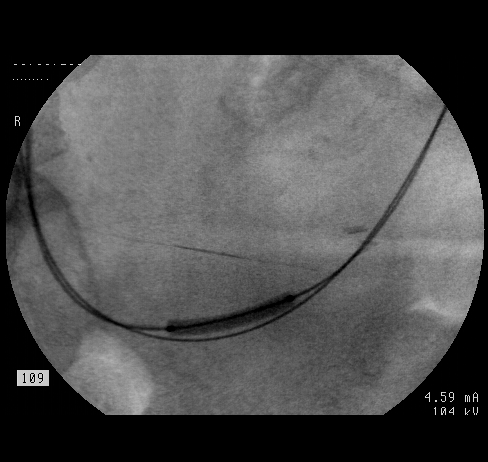
[im 9/12]
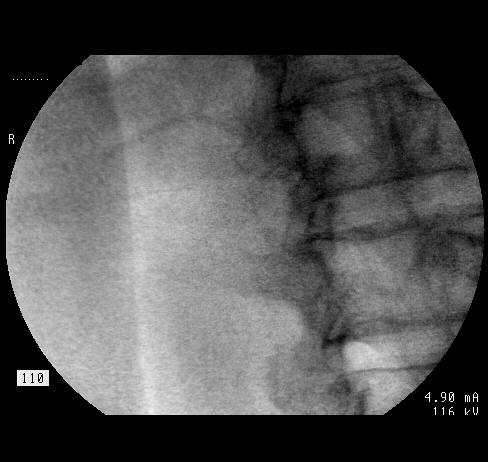
[im 10/12]
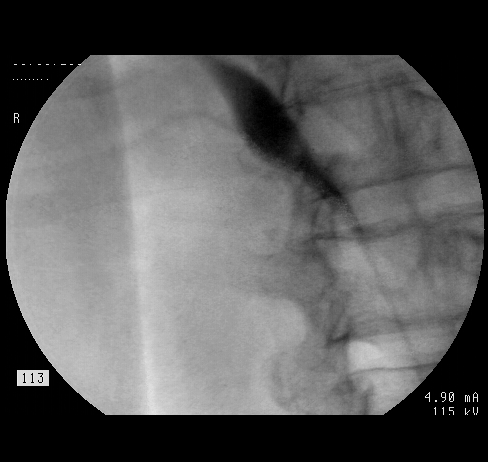
[im 11/12]
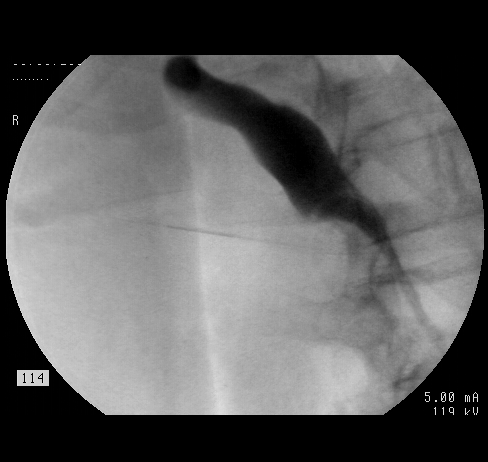
[im 12/12]
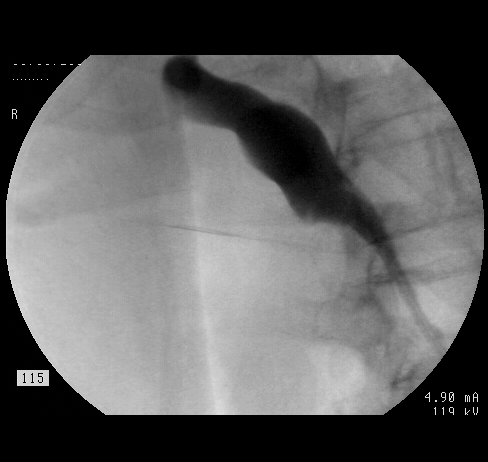

[12 of 12 positions shown; findings below may reference images not displayed]

FINDINGS: 11 spot intraoperative fluoroscopic images during right-sided
percutaneous nephrolithotomy are provided for review.

Initial image demonstrates opacification of the right renal pelvis
and superior aspect of the right ureter from an apparent
percutaneous access.

There is marked tortuosity involving the mid aspect of the right
ureter with apparent short-segment occlusion. There is a ill-defined
nonocclusive filling defect within the tortuous segment of the mid
aspect the right ureter which likely correlates with the ureteral
stone seen on preceding abdominal CT.

Subsequent images demonstrate insufflation of a angioplasty balloon
at the level of tortuosity.

Completion images demonstrate a retrograde pyelogram with residual
moderate right-sided pelvicaliectasis.
IMPRESSION: 1. Right-sided retrograde pyelogram from percutaneous and retrograde
access as detailed above.
2. Nonocclusive filling defect within mid/distal aspect of the right
ureter at the location of tortuosity likely compatible with the
ureteral stone seen on preceding abdominal CT.

## 2018-02-06 ENCOUNTER — Other Ambulatory Visit: Payer: Self-pay | Admitting: Otolaryngology

## 2018-02-06 DIAGNOSIS — D3702 Neoplasm of uncertain behavior of tongue: Secondary | ICD-10-CM

## 2018-02-12 ENCOUNTER — Ambulatory Visit
Admission: RE | Admit: 2018-02-12 | Discharge: 2018-02-12 | Disposition: A | Discharge status: Home / Self Care | Payer: Medicare HMO | Source: Ambulatory Visit | Attending: Otolaryngology | Admitting: Otolaryngology

## 2018-02-12 DIAGNOSIS — D3702 Neoplasm of uncertain behavior of tongue: Secondary | ICD-10-CM

## 2018-02-12 MED ORDER — IOPAMIDOL (ISOVUE-300) INJECTION 61%
50.0000 mL | Freq: Once | INTRAVENOUS | Status: AC | PRN
  Administered 2018-02-12: 50 mL via INTRAVENOUS

## 2018-03-05 ENCOUNTER — Encounter (HOSPITAL_COMMUNITY): Payer: Self-pay | Admitting: *Deleted

## 2018-03-05 ENCOUNTER — Other Ambulatory Visit: Payer: Self-pay

## 2018-03-05 NOTE — Progress Notes (Signed)
Spoke with pt's wife, Carlyon Shadow for pre-op call. Pt has some dementia. Darlene states pt does not have a cardiac history. Pt is Pre-diabetic. Last A1C found was 5.8 on 04/09/17. Wife states they do not check blood sugar at home.

## 2018-03-06 NOTE — Anesthesia Preprocedure Evaluation (Addendum)
Anesthesia Evaluation  Patient identified by MRN, date of birth, ID band Patient awake    Reviewed: Allergy & Precautions, NPO status , Patient's Chart, lab work & pertinent test results  Airway Mallampati: IV  TM Distance: >3 FB Neck ROM: Full    Dental  (+) Teeth Intact, Dental Advisory Given, Poor Dentition, Chipped,    Pulmonary neg pulmonary ROS,    breath sounds clear to auscultation       Cardiovascular hypertension, Pt. on medications + Peripheral Vascular Disease   Rhythm:Regular Rate:Normal     Neuro/Psych Dementia negative neurological ROS     GI/Hepatic Neg liver ROS, GERD  Medicated,  Endo/Other    Renal/GU      Musculoskeletal  (+) Arthritis ,   Abdominal Normal abdominal exam  (+)   Peds  Hematology   Anesthesia Other Findings -  HLD  Reproductive/Obstetrics                           Lab Results  Component Value Date   WBC 6.8 12/31/2014   HGB 14.6 01/07/2015   HCT 46.3 01/07/2015   MCV 91.5 12/31/2014   PLT 276 12/31/2014   Lab Results  Component Value Date   CREATININE 1.85 (H) 01/07/2015   BUN 16 01/07/2015   NA 139 01/07/2015   K 4.5 01/07/2015   CL 102 01/07/2015   CO2 27 01/07/2015   Lab Results  Component Value Date   INR 1.08 12/15/2014   INR 1.09 07/15/2012   INR 1.23 06/26/2012   EKG: normal sinus rhythm.  Anesthesia Physical Anesthesia Plan  ASA: III  Anesthesia Plan: General   Post-op Pain Management:    Induction: Intravenous  PONV Risk Score and Plan: 3 and Ondansetron, Dexamethasone and Midazolam  Airway Management Planned: Tracheostomy  Additional Equipment: None  Intra-op Plan:   Post-operative Plan:   Informed Consent: I have reviewed the patients History and Physical, chart, labs and discussed the procedure including the risks, benefits and alternatives for the proposed anesthesia with the patient or authorized  representative who has indicated his/her understanding and acceptance.       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

## 2018-03-07 ENCOUNTER — Inpatient Hospital Stay (HOSPITAL_COMMUNITY)
Admission: RE | Admit: 2018-03-07 | Discharge: 2018-03-18 | DRG: 012 | Disposition: A | Discharge status: Home Health | Payer: Medicare HMO | Attending: Otolaryngology | Admitting: Otolaryngology

## 2018-03-07 ENCOUNTER — Other Ambulatory Visit: Payer: Self-pay

## 2018-03-07 ENCOUNTER — Ambulatory Visit (HOSPITAL_COMMUNITY): Payer: Medicare HMO | Admitting: Physician Assistant

## 2018-03-07 ENCOUNTER — Encounter (HOSPITAL_COMMUNITY): Payer: Self-pay

## 2018-03-07 ENCOUNTER — Encounter (HOSPITAL_COMMUNITY)
Admission: RE | Disposition: A | Discharge status: Home Health | Payer: Self-pay | Source: Home / Self Care | Attending: Otolaryngology

## 2018-03-07 DIAGNOSIS — E440 Moderate protein-calorie malnutrition: Secondary | ICD-10-CM

## 2018-03-07 DIAGNOSIS — R1310 Dysphagia, unspecified: Secondary | ICD-10-CM | POA: Diagnosis present

## 2018-03-07 DIAGNOSIS — K219 Gastro-esophageal reflux disease without esophagitis: Secondary | ICD-10-CM | POA: Diagnosis present

## 2018-03-07 DIAGNOSIS — K5900 Constipation, unspecified: Secondary | ICD-10-CM | POA: Diagnosis not present

## 2018-03-07 DIAGNOSIS — Z7189 Other specified counseling: Secondary | ICD-10-CM | POA: Diagnosis not present

## 2018-03-07 DIAGNOSIS — Z87820 Personal history of traumatic brain injury: Secondary | ICD-10-CM

## 2018-03-07 DIAGNOSIS — E785 Hyperlipidemia, unspecified: Secondary | ICD-10-CM | POA: Diagnosis present

## 2018-03-07 DIAGNOSIS — Z8551 Personal history of malignant neoplasm of bladder: Secondary | ICD-10-CM | POA: Diagnosis not present

## 2018-03-07 DIAGNOSIS — I739 Peripheral vascular disease, unspecified: Secondary | ICD-10-CM | POA: Diagnosis present

## 2018-03-07 DIAGNOSIS — K317 Polyp of stomach and duodenum: Secondary | ICD-10-CM | POA: Diagnosis present

## 2018-03-07 DIAGNOSIS — Z85828 Personal history of other malignant neoplasm of skin: Secondary | ICD-10-CM | POA: Diagnosis not present

## 2018-03-07 DIAGNOSIS — K449 Diaphragmatic hernia without obstruction or gangrene: Secondary | ICD-10-CM | POA: Diagnosis present

## 2018-03-07 DIAGNOSIS — Z96652 Presence of left artificial knee joint: Secondary | ICD-10-CM | POA: Diagnosis present

## 2018-03-07 DIAGNOSIS — Z515 Encounter for palliative care: Secondary | ICD-10-CM | POA: Diagnosis not present

## 2018-03-07 DIAGNOSIS — Z7401 Bed confinement status: Secondary | ICD-10-CM | POA: Diagnosis not present

## 2018-03-07 DIAGNOSIS — I129 Hypertensive chronic kidney disease with stage 1 through stage 4 chronic kidney disease, or unspecified chronic kidney disease: Secondary | ICD-10-CM | POA: Diagnosis present

## 2018-03-07 DIAGNOSIS — Z86718 Personal history of other venous thrombosis and embolism: Secondary | ICD-10-CM | POA: Diagnosis not present

## 2018-03-07 DIAGNOSIS — R7303 Prediabetes: Secondary | ICD-10-CM | POA: Diagnosis present

## 2018-03-07 DIAGNOSIS — F0390 Unspecified dementia without behavioral disturbance: Secondary | ICD-10-CM | POA: Diagnosis present

## 2018-03-07 DIAGNOSIS — Z66 Do not resuscitate: Secondary | ICD-10-CM | POA: Diagnosis present

## 2018-03-07 DIAGNOSIS — H9190 Unspecified hearing loss, unspecified ear: Secondary | ICD-10-CM | POA: Diagnosis present

## 2018-03-07 DIAGNOSIS — C01 Malignant neoplasm of base of tongue: Secondary | ICD-10-CM | POA: Diagnosis present

## 2018-03-07 DIAGNOSIS — Z4659 Encounter for fitting and adjustment of other gastrointestinal appliance and device: Secondary | ICD-10-CM

## 2018-03-07 DIAGNOSIS — N189 Chronic kidney disease, unspecified: Secondary | ICD-10-CM | POA: Diagnosis present

## 2018-03-07 HISTORY — DX: Chronic kidney disease, unspecified: N18.9

## 2018-03-07 HISTORY — DX: Unspecified dementia, unspecified severity, without behavioral disturbance, psychotic disturbance, mood disturbance, and anxiety: F03.90

## 2018-03-07 HISTORY — DX: Anemia, unspecified: D64.9

## 2018-03-07 HISTORY — PX: TRACHEOSTOMY TUBE PLACEMENT: SHX814

## 2018-03-07 HISTORY — PX: DIRECT LARYNGOSCOPY: SHX5326

## 2018-03-07 HISTORY — DX: Prediabetes: R73.03

## 2018-03-07 HISTORY — DX: Peripheral vascular disease, unspecified: I73.9

## 2018-03-07 LAB — GLUCOSE, CAPILLARY
Glucose-Capillary: 96 mg/dL (ref 70–99)
Glucose-Capillary: 143 mg/dL — ABNORMAL HIGH (ref 70–99)

## 2018-03-07 SURGERY — LARYNGOSCOPY, DIRECT
Anesthesia: General | Site: Neck

## 2018-03-07 MED ORDER — EPINEPHRINE HCL (NASAL) 0.1 % NA SOLN
NASAL | Status: AC
  Filled 2018-03-07: qty 30

## 2018-03-07 MED ORDER — POTASSIUM CHLORIDE CRYS ER 10 MEQ PO TBCR
10.0000 meq | EXTENDED_RELEASE_TABLET | Freq: Every day | ORAL | Status: DC
  Administered 2018-03-09 – 2018-03-16 (×6): 10 meq via ORAL
  Filled 2018-03-07 (×6): qty 1

## 2018-03-07 MED ORDER — ROCURONIUM BROMIDE 50 MG/5ML IV SOSY
PREFILLED_SYRINGE | INTRAVENOUS | Status: DC | PRN
  Administered 2018-03-07: 50 mg via INTRAVENOUS

## 2018-03-07 MED ORDER — ATORVASTATIN CALCIUM 40 MG PO TABS
40.0000 mg | ORAL_TABLET | Freq: Every day | ORAL | Status: DC
  Administered 2018-03-09 – 2018-03-12 (×4): 40 mg via ORAL
  Filled 2018-03-07 (×6): qty 1

## 2018-03-07 MED ORDER — LIDOCAINE-EPINEPHRINE 1 %-1:100000 IJ SOLN
INTRAMUSCULAR | Status: DC | PRN
  Administered 2018-03-07: 20 mL

## 2018-03-07 MED ORDER — FENTANYL CITRATE (PF) 100 MCG/2ML IJ SOLN
INTRAMUSCULAR | Status: AC
  Filled 2018-03-07: qty 2

## 2018-03-07 MED ORDER — FENTANYL CITRATE (PF) 100 MCG/2ML IJ SOLN
25.0000 ug | INTRAMUSCULAR | Status: DC | PRN
  Administered 2018-03-07 (×3): 50 ug via INTRAVENOUS

## 2018-03-07 MED ORDER — PANTOPRAZOLE SODIUM 40 MG PO TBEC
40.0000 mg | DELAYED_RELEASE_TABLET | Freq: Every day | ORAL | Status: DC
  Administered 2018-03-09 – 2018-03-16 (×7): 40 mg via ORAL
  Filled 2018-03-07 (×8): qty 1

## 2018-03-07 MED ORDER — PROPOFOL 10 MG/ML IV BOLUS
INTRAVENOUS | Status: DC | PRN
  Administered 2018-03-07: 50 mg via INTRAVENOUS

## 2018-03-07 MED ORDER — ONDANSETRON 4 MG PO TBDP
4.0000 mg | ORAL_TABLET | Freq: Four times a day (QID) | ORAL | Status: DC | PRN
  Filled 2018-03-07: qty 1

## 2018-03-07 MED ORDER — SUCCINYLCHOLINE CHLORIDE 200 MG/10ML IV SOSY
PREFILLED_SYRINGE | INTRAVENOUS | Status: AC
  Filled 2018-03-07: qty 10

## 2018-03-07 MED ORDER — DEXTROSE-NACL 5-0.45 % IV SOLN
INTRAVENOUS | Status: DC
  Administered 2018-03-07 – 2018-03-11 (×9): via INTRAVENOUS
  Administered 2018-03-11: 125 mL/h via INTRAVENOUS
  Administered 2018-03-12 – 2018-03-17 (×10): via INTRAVENOUS

## 2018-03-07 MED ORDER — FENTANYL CITRATE (PF) 250 MCG/5ML IJ SOLN
INTRAMUSCULAR | Status: AC
  Filled 2018-03-07: qty 5

## 2018-03-07 MED ORDER — ONDANSETRON HCL 4 MG/2ML IJ SOLN
4.0000 mg | Freq: Four times a day (QID) | INTRAMUSCULAR | Status: DC | PRN
  Administered 2018-03-13 (×2): 4 mg via INTRAVENOUS
  Filled 2018-03-07 (×2): qty 2

## 2018-03-07 MED ORDER — MORPHINE SULFATE (PF) 2 MG/ML IV SOLN
2.0000 mg | INTRAVENOUS | Status: DC | PRN
  Administered 2018-03-08 – 2018-03-16 (×12): 2 mg via INTRAVENOUS
  Filled 2018-03-07 (×15): qty 1

## 2018-03-07 MED ORDER — LACTATED RINGERS IV SOLN
INTRAVENOUS | Status: DC
  Administered 2018-03-07: 10:00:00 via INTRAVENOUS

## 2018-03-07 MED ORDER — STERILE WATER FOR IRRIGATION IR SOLN
Status: DC | PRN
  Administered 2018-03-07: 200 mL

## 2018-03-07 MED ORDER — LIDOCAINE-EPINEPHRINE 1 %-1:100000 IJ SOLN
INTRAMUSCULAR | Status: AC
  Filled 2018-03-07: qty 1

## 2018-03-07 MED ORDER — HYDROCHLOROTHIAZIDE 25 MG PO TABS
25.0000 mg | ORAL_TABLET | Freq: Every day | ORAL | Status: DC
  Administered 2018-03-09 – 2018-03-12 (×4): 25 mg via ORAL
  Filled 2018-03-07 (×5): qty 1

## 2018-03-07 MED ORDER — 0.9 % SODIUM CHLORIDE (POUR BTL) OPTIME
TOPICAL | Status: DC | PRN
  Administered 2018-03-07: 1000 mL

## 2018-03-07 MED ORDER — PROPOFOL 10 MG/ML IV BOLUS
INTRAVENOUS | Status: AC
  Filled 2018-03-07: qty 20

## 2018-03-07 MED ORDER — MIDAZOLAM HCL 2 MG/2ML IJ SOLN
INTRAMUSCULAR | Status: DC | PRN
  Administered 2018-03-07: 0.5 mg via INTRAVENOUS

## 2018-03-07 MED ORDER — DONEPEZIL HCL 5 MG PO TABS
5.0000 mg | ORAL_TABLET | Freq: Every day | ORAL | Status: DC
  Administered 2018-03-08 – 2018-03-11 (×4): 5 mg via ORAL
  Filled 2018-03-07 (×5): qty 1

## 2018-03-07 MED ORDER — OXYMETAZOLINE HCL 0.05 % NA SOLN
NASAL | Status: AC
  Filled 2018-03-07: qty 30

## 2018-03-07 MED ORDER — MIDAZOLAM HCL 2 MG/2ML IJ SOLN
INTRAMUSCULAR | Status: AC
  Filled 2018-03-07: qty 2

## 2018-03-07 MED ORDER — FENTANYL CITRATE (PF) 100 MCG/2ML IJ SOLN
INTRAMUSCULAR | Status: AC
  Administered 2018-03-07: 50 ug via INTRAVENOUS
  Filled 2018-03-07: qty 2

## 2018-03-07 SURGICAL SUPPLY — 52 items
BLADE CLIPPER SURG (BLADE) IMPLANT
BLADE SURG 15 STRL LF DISP TIS (BLADE) IMPLANT
BLADE SURG 15 STRL SS (BLADE)
CANISTER SUCT 3000ML PPV (MISCELLANEOUS) ×4 IMPLANT
CLEANER TIP ELECTROSURG 2X2 (MISCELLANEOUS) ×4 IMPLANT
CLOSURE WOUND 1/2 X4 (GAUZE/BANDAGES/DRESSINGS)
COAGULATOR SUCT 6 FR SWTCH (ELECTROSURGICAL) ×1
COAGULATOR SUCT SWTCH 10FR 6 (ELECTROSURGICAL) ×1 IMPLANT
COVER BACK TABLE 60X90IN (DRAPES) ×2 IMPLANT
COVER MAYO STAND STRL (DRAPES) ×4 IMPLANT
COVER SURGICAL LIGHT HANDLE (MISCELLANEOUS) ×4 IMPLANT
COVER WAND RF STERILE (DRAPES) ×2 IMPLANT
CRADLE DONUT ADULT HEAD (MISCELLANEOUS) ×2 IMPLANT
DRAPE HALF SHEET 40X57 (DRAPES) ×4 IMPLANT
ELECT COATED BLADE 2.86 ST (ELECTRODE) ×4 IMPLANT
ELECT REM PT RETURN 9FT ADLT (ELECTROSURGICAL) ×4
ELECTRODE REM PT RTRN 9FT ADLT (ELECTROSURGICAL) ×2 IMPLANT
GAUZE 4X4 16PLY RFD (DISPOSABLE) ×4 IMPLANT
GLOVE BIOGEL PI IND STRL 6.5 (GLOVE) IMPLANT
GLOVE BIOGEL PI IND STRL 8 (GLOVE) IMPLANT
GLOVE BIOGEL PI INDICATOR 6.5 (GLOVE) ×2
GLOVE BIOGEL PI INDICATOR 8 (GLOVE) ×2
GLOVE ECLIPSE 7.5 STRL STRAW (GLOVE) ×8 IMPLANT
GLOVE SURG SS PI 6.0 STRL IVOR (GLOVE) ×2 IMPLANT
GOWN STRL REUS W/ TWL LRG LVL3 (GOWN DISPOSABLE) ×4 IMPLANT
GOWN STRL REUS W/TWL LRG LVL3 (GOWN DISPOSABLE) ×8
GUARD TEETH (MISCELLANEOUS) ×2 IMPLANT
KIT BASIN OR (CUSTOM PROCEDURE TRAY) ×4 IMPLANT
KIT TURNOVER KIT B (KITS) ×4 IMPLANT
NDL HYPO 25GX1X1/2 BEV (NEEDLE) ×2 IMPLANT
NEEDLE HYPO 25GX1X1/2 BEV (NEEDLE) ×4 IMPLANT
NS IRRIG 1000ML POUR BTL (IV SOLUTION) ×4 IMPLANT
PAD ARMBOARD 7.5X6 YLW CONV (MISCELLANEOUS) ×6 IMPLANT
PATTIES SURGICAL .5 X3 (DISPOSABLE) ×2 IMPLANT
PENCIL FOOT CONTROL (ELECTRODE) ×4 IMPLANT
SOL PREP POV-IOD 4OZ 10% (MISCELLANEOUS) ×2 IMPLANT
SOLUTION ANTI FOG 6CC (MISCELLANEOUS) IMPLANT
SPECIMEN JAR SMALL (MISCELLANEOUS) ×2 IMPLANT
SPONGE DRAIN TRACH 4X4 STRL 2S (GAUZE/BANDAGES/DRESSINGS) ×2 IMPLANT
STRIP CLOSURE SKIN 1/2X4 (GAUZE/BANDAGES/DRESSINGS) IMPLANT
SURGILUBE 2OZ TUBE FLIPTOP (MISCELLANEOUS) ×2 IMPLANT
SUT CHROMIC GUT 2 0 PS 2 27 (SUTURE) ×4 IMPLANT
SUT ETHILON 3 0 PS 1 (SUTURE) ×2 IMPLANT
SUT SILK 3 0 SH 30 (SUTURE) ×4 IMPLANT
SUT SILK 3 0 TIES 17X18 (SUTURE)
SUT SILK 3-0 18XBRD TIE BLK (SUTURE) ×2 IMPLANT
SYR CONTROL 10ML LL (SYRINGE) ×2 IMPLANT
TOWEL OR 17X24 6PK STRL BLUE (TOWEL DISPOSABLE) ×6 IMPLANT
TRAY ENT MC OR (CUSTOM PROCEDURE TRAY) ×4 IMPLANT
TUBE CONNECTING 12'X1/4 (SUCTIONS) ×1
TUBE CONNECTING 12X1/4 (SUCTIONS) ×3 IMPLANT
WATER STERILE IRR 1000ML POUR (IV SOLUTION) ×4 IMPLANT

## 2018-03-07 NOTE — Anesthesia Procedure Notes (Signed)
Date/Time: 03/07/2018 1:00 PM Performed by: Lance Coon, CRNA Pre-anesthesia Checklist: Patient identified, Emergency Drugs available, Suction available, Patient being monitored and Timeout performed Patient Re-evaluated:Patient Re-evaluated prior to induction Oxygen Delivery Method: Circle system utilized Induction Type: Inhalational induction Comments: 6.0 shiley cuffed trach placed by Dr. Janace Hoard

## 2018-03-07 NOTE — Transfer of Care (Signed)
Immediate Anesthesia Transfer of Care Note  Patient: Chad Olson  Procedure(s) Performed: DIRECT LARYNGOSCOPY WITH BIOPSY (N/A ) TRACHEOSTOMY (N/A )  Patient Location: PACU  Anesthesia Type:General  Level of Consciousness: awake, alert  and patient cooperative  Airway & Oxygen Therapy: Patient Spontanous Breathing and Patient connected to face mask oxygen  Post-op Assessment: Report given to RN and Post -op Vital signs reviewed and stable  Post vital signs: Reviewed and stable  Last Vitals:  Vitals Value Taken Time  BP 137/109 03/07/2018  1:46 PM  Temp    Pulse 101 03/07/2018  1:47 PM  Resp 22 03/07/2018  1:47 PM  SpO2 97 % 03/07/2018  1:47 PM  Vitals shown include unvalidated device data.  Last Pain:  Vitals:   03/07/18 1024  TempSrc:   PainSc: 0-No pain         Complications: No apparent anesthesia complications
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
## 2018-03-07 NOTE — Op Note (Signed)
Preop/postop diagnosis: Base of tongue tumor Procedure: Tracheotomy and direct laryngoscopy with biopsy Anesthesia: General Estimated blood loss: Less than 5 cc Indications: 73 year old with a large mass in his base of tongue and a large mass in the left neck.  He has a tumor that is extending into his airway and to the posterior wall.  It was felt that a biopsy would be difficult relative to his airway so a local trach was planned as well as then a direct laryngoscopy.  The family and patient were informed the risk and benefits of the procedure and options were discussed all questions were answered and consent was obtained. Operation: Patient was taken the operating placed supine position after a small amount of Versed the patient was prepped and draped in the usual sterile manner.  The skin was injected with 1% lidocaine with 1 100,000 epinephrine.  The incision vertically was made with electrocautery and dissected down to the strap muscles.  The diastases of the strap muscle was divided.  The cricoid was fairly low in his neck.  The isthmus of the thyroid was identified and divided with electrocautery.  A cricoid hook was placed into the cricoid and the trachea was pulled superiorly and a inferior based flap was made with the Metzenbaums at the first and second tracheal ring.  It was secured with a 2-0 chromic to the subcutaneous tissue.  A #6 Shiley was then placed without difficulty with good end-tidal CO2 return.  The trach was secured with a 3-0 nylon and Velcro trach collar.  There was good hemostasis.  Table was turned the patient was placed in the Rose position and the Dedo scope was inserted in the pharynx was evaluated.  There was a large mass in the base of tongue that basically extended from one side to the other.  Very exophytic.  Biopsy was taken from the superior aspect of it.  It was very vascular.  A suction cautery was used to gain hemostasis.  A pledget was placed.  There was good  hemostasis and all the blood was suctioned out of his pharynx.  The patient was awakened brought to recovery in stable condition counts correct:

## 2018-03-08 ENCOUNTER — Inpatient Hospital Stay (HOSPITAL_COMMUNITY): Payer: Medicare HMO

## 2018-03-08 ENCOUNTER — Encounter (HOSPITAL_COMMUNITY): Payer: Self-pay | Admitting: Otolaryngology

## 2018-03-08 MED ORDER — ORAL CARE MOUTH RINSE
15.0000 mL | Freq: Two times a day (BID) | OROMUCOSAL | Status: DC
  Administered 2018-03-08 – 2018-03-17 (×14): 15 mL via OROMUCOSAL

## 2018-03-08 MED ORDER — CHLORHEXIDINE GLUCONATE 0.12 % MT SOLN
15.0000 mL | Freq: Two times a day (BID) | OROMUCOSAL | Status: DC
  Administered 2018-03-08 – 2018-03-18 (×20): 15 mL via OROMUCOSAL
  Filled 2018-03-08 (×20): qty 15

## 2018-03-08 NOTE — Procedures (Signed)
Cortrak  Tube Type:  Cortrak - 43 inches Tube Location:  Left nare Initial Placement:  Stomach Secured by: Bridle Technique Used to Measure Tube Placement:  Documented cm marking at nare/ corner of mouth Cortrak Secured At:  62 cm    Cortrak Tube Team Note:  Consult received to place a Cortrak feeding tube.   X-ray is required, abdominal x-ray has been ordered by the Cortrak team. Please confirm tube placement before using the Cortrak tube.   If the tube becomes dislodged please keep the tube and contact the Cortrak team at [email or web address] (password [email or web address]) for replacement.  If after hours and replacement cannot be delayed, place a NG tube and confirm placement with an abdominal x-ray.    Koleen Distance MS, RD, LDN Pager #- [phone] Office#- [phone] After Hours Pager: [phone]

## 2018-03-08 NOTE — Anesthesia Postprocedure Evaluation (Signed)
Anesthesia Post Note  Patient: Chad Olson  Procedure(s) Performed: DIRECT LARYNGOSCOPY WITH BIOPSY (N/A Mouth) TRACHEOSTOMY (N/A Neck)     Patient location during evaluation: PACU Anesthesia Type: General Level of consciousness: awake and alert Pain management: pain level controlled Vital Signs Assessment: post-procedure vital signs reviewed and stable Respiratory status: spontaneous breathing, nonlabored ventilation, respiratory function stable and patient connected to nasal cannula oxygen Cardiovascular status: blood pressure returned to baseline and stable Postop Assessment: no apparent nausea or vomiting Anesthetic complications: no    Last Vitals:  Vitals:   03/08/18 0800 03/08/18 0827  BP: 128/79 128/79  Pulse: 64 65  Resp: 14 15  Temp:    SpO2: 98% 98%    Last Pain:  Vitals:   03/08/18 0800  TempSrc:   PainSc: 0-No pain                 Effie Berkshire

## 2018-03-08 NOTE — Progress Notes (Signed)
1 Day Post-Op   Subjective/Chief Complaint: He feels well no complaints   Objective: Vital signs in last 24 hours: Temp:  [97.5 F (36.4 C)-98.4 F (36.9 C)] 98.4 F (36.9 C) (03/06 0700) Pulse Rate:  [63-100] 65 (03/06 0827) Resp:  [8-23] 15 (03/06 0827) BP: (111-143)/(69-109) 128/79 (03/06 0827) SpO2:  [94 %-99 %] 98 % (03/06 0827) FiO2 (%):  [28 %] 28 % (03/06 0827) Last BM Date: (PTA)  Intake/Output from previous day: 03/05 0701 - 03/06 0700 In: 2175 [I.V.:2075] Out: 1975 [Urine:1925; Blood:50] Intake/Output this shift: Total I/O In: 250 [I.V.:250] Out: -   awake alert. some blood from the trach. breathing well.   Lab Results:  No results for input(s): WBC, HGB, HCT, PLT in the last 72 hours. BMET No results for input(s): NA, K, CL, CO2, GLUCOSE, BUN, CREATININE, CALCIUM in the last 72 hours. PT/INR No results for input(s): LABPROT, INR in the last 72 hours. ABG No results for input(s): PHART, HCO3 in the last 72 hours.  Invalid input(s): PCO2, PO2  Studies/Results: No results found.  Anti-infectives: Anti-infectives (From admission, onward)   None      Assessment/Plan: s/p Procedure(s): DIRECT LARYNGOSCOPY WITH BIOPSY (N/A) TRACHEOSTOMY (N/A) trach with some bleeding clot but airway good. will watch this as it does not appear to be sigificant. will hold him in ICU one more day to observe. try some clear liquids. Up in chair  LOS: 1 day    Melissa Montane 03/08/2018

## 2018-03-08 NOTE — Progress Notes (Addendum)
Initial Nutrition Assessment  DOCUMENTATION CODES:   Non-severe (moderate) malnutrition in context of chronic illness  INTERVENTION:   Recommend begin TF via Cortrak:   Osmolite 1.5 at 60 ml/h (1440 ml per day)  Pro-stat 30 ml TID  Provides 2460 kcal, 135 gm protein, 1097 ml free water daily  Free water flushes 200 ml 6 times per day   NUTRITION DIAGNOSIS:   Moderate Malnutrition related to chronic illness as evidenced by mild muscle depletion, percent weight loss(20% weight loss within a year).  GOAL:   Patient will meet greater than or equal to 90% of their needs  MONITOR:   Labs, Weight trends, Skin, I & O's, TF tolerance  REASON FOR ASSESSMENT:   Rounds(New trach, failed swallow eval; Cortrak being placed)    ASSESSMENT:   73 yo male with PMH of HTN, HLD, bladder carcinoma, PVD, CKD who was admitted with large mass at base of tongue. S/P tracheostomy on admission.  Per RN, patient was unable to swallow anything this morning. Cortrak placed today, tip in the stomach. No mention of starting TF today, but patient will likely require a PEG next week.  Family reports that PTA patient was eating soft foods and liquids only, such as soups, applesauce, and Ensure because he was having trouble swallowing. He has been losing weight over the past year. He has lost at least 47 lbs within the past year. Usual weight 247 lbs 1 year ago, down to 200 lbs a week PTA (at yearly doctor's check-up appointment). Currently 198 lbs. 20% weight loss within the past year is significant for the time frame.   Labs reviewed. Medications reviewed and include KCl.   NUTRITION - FOCUSED PHYSICAL EXAM:    Most Recent Value  Orbital Region  No depletion  Upper Arm Region  Mild depletion  Thoracic and Lumbar Region  No depletion  Buccal Region  No depletion  Temple Region  No depletion  Clavicle Bone Region  Mild depletion  Clavicle and Acromion Bone Region  No depletion  Scapular Bone  Region  Unable to assess  Dorsal Hand  Mild depletion  Patellar Region  Mild depletion  Anterior Thigh Region  Mild depletion  Posterior Calf Region  Mild depletion  Edema (RD Assessment)  None  Hair  Reviewed  Eyes  Reviewed  Mouth  Reviewed  Skin  Reviewed  Nails  Reviewed       Diet Order:   Diet Order            Diet NPO time specified  Diet effective now              EDUCATION NEEDS:   No education needs have been identified at this time  Skin:  Skin Assessment: (MASD to sacrum)  Last BM:  none documented since admission  Height:   Ht Readings from Last 1 Encounters:  03/07/18 5\' 11"  (1.803 m)    Weight:   Wt Readings from Last 1 Encounters:  03/07/18 89.8 kg    Ideal Body Weight:  78.2 kg  BMI:  Body mass index is 27.62 kg/m.  Estimated Nutritional Needs:   Kcal:  2200-2500  Protein:  125-150 gm  Fluid:  2.2 L    Molli Barrows, RD, LDN, CNSC Pager [phone] After Hours Pager [phone]

## 2018-03-09 LAB — GLUCOSE, CAPILLARY
GLUCOSE-CAPILLARY: 123 mg/dL — AB (ref 70–99)
Glucose-Capillary: 111 mg/dL — ABNORMAL HIGH (ref 70–99)
Glucose-Capillary: 112 mg/dL — ABNORMAL HIGH (ref 70–99)

## 2018-03-09 MED ORDER — FREE WATER
200.0000 mL | Status: AC
  Administered 2018-03-09 – 2018-03-12 (×21): 200 mL

## 2018-03-09 MED ORDER — PRO-STAT SUGAR FREE PO LIQD
30.0000 mL | Freq: Three times a day (TID) | ORAL | Status: AC
  Administered 2018-03-09 – 2018-03-12 (×12): 30 mL
  Filled 2018-03-09 (×12): qty 30

## 2018-03-09 MED ORDER — FREE WATER: 200.0000 mL | Freq: Four times a day (QID) | Status: DC

## 2018-03-09 MED ORDER — OSMOLITE 1.5 CAL PO LIQD
1000.0000 mL | ORAL | Status: AC
  Administered 2018-03-09 – 2018-03-12 (×4): 1000 mL
  Filled 2018-03-09 (×6): qty 1000

## 2018-03-09 MED ORDER — JEVITY 1.2 CAL PO LIQD
1000.0000 mL | ORAL | Status: DC
  Filled 2018-03-09: qty 1000

## 2018-03-09 NOTE — Progress Notes (Signed)
Pt transferred from [place], alert and oriented with trach - t-collar 28% with minimal bleeding noted at the trach site, with cortrak on tube feeding osmolite at 30cc/hr goal is 60 ml and water flush of 200 ml q 4h, with urostomy, 30 degrees head elevated, NPO, SCD, skin intact with pink foam at the buttocks, hard of hearing with hearing aid, family at the bedside, with continous IVF at left wrist peripheral IV line, no complain of pain at this time, informed the respiratory therapist.

## 2018-03-09 NOTE — Progress Notes (Signed)
Patient ID: Chad Olson, male   DOB: 02-Feb-1945, 73 y.o.   MRN: [id number] Subjective: No complaints.  Some discomfort when he coughs.  Breathing is clear.  His wife is in the room with him.  Objective: Vital signs in last 24 hours: Temp:  [98.2 F (36.8 C)-98.6 F (37 C)] 98.4 F (36.9 C) (03/07 0736) Pulse Rate:  [56-86] 85 (03/07 0800) Resp:  [11-35] 35 (03/07 0800) BP: (121-138)/(72-83) 133/74 (03/07 0600) SpO2:  [91 %-100 %] 97 % (03/07 0800) FiO2 (%):  [28 %] 28 % (03/07 0425) Weight change:  Last BM Date: (PTA)  Intake/Output from previous day: 03/06 0701 - 03/07 0700 In: 2705 [I.V.:2625; NG/GT:80] Out: 2500 [Urine:2500] Intake/Output this shift: Total I/O In: 80 [NG/GT:80] Out: -   PHYSICAL EXAM: Tracheostomy in place.  Thick mucoid secretions.  Breathing is clear.  Oral cavity and tongue look healthy without swelling or bleeding.  Lab Results: No results for input(s): WBC, HGB, HCT, PLT in the last 72 hours. BMET No results for input(s): NA, K, CL, CO2, GLUCOSE, BUN, CREATININE, CALCIUM in the last 72 hours.  Studies/Results: Dg Abd Portable 1v  Result Date: 03/08/2018 CLINICAL DATA:  Feeding tube placement EXAM: PORTABLE ABDOMEN - 1 VIEW COMPARISON:  None. FINDINGS: Weighted tip of the enteric tube projects over the gastric body. There is gas distended bowel throughout the abdomen. Sutures in the right lower quadrant. IMPRESSION: Weighted tip enteric tube projects over the stomach. Electronically Signed   By: Ulyses Jarred M.D.   On: 03/08/2018 15:37    Medications: I have reviewed the patient's current medications.  Assessment/Plan: Postop day 1, stable, discharged to regular room.  Start tube feeds.  Continue to monitor.  LOS: 2 days   Izora Gala 03/09/2018, 8:34 AM

## 2018-03-09 NOTE — Progress Notes (Signed)
Report called to 6N RN.

## 2018-03-09 NOTE — Progress Notes (Signed)
Nutrition Brief Note  RD consulted for tf initiation/management. Pt was just seen yesterday afternoon by RD who recommended the following TF regimen:    Osmolite 1.5 at 60 ml/h (1440 ml per day)  Pro-stat 30 ml TID  Provides 2460 kcal, 135 gm protein, 1097 ml free water daily  Free water flushes 200 ml 6 times per day  Will implement this regimen. His wife was at bedside this morning and she requested TF be initiated gradually d/t patient not having anything to eat for several days. Will initiate at 30 cc/hr and advance by 10 cc q4 hrs.   Burtis Junes RD, LDN, CNSC Clinical Nutrition Available Tues-Sat via Pager: [phone] 03/09/2018 10:04 AM

## 2018-03-09 NOTE — Progress Notes (Signed)
Pt inner cannula, trach collar tubings, neck strap was changed, suction PRN, changed the neck bandage and gown.

## 2018-03-10 LAB — GLUCOSE, CAPILLARY
GLUCOSE-CAPILLARY: 126 mg/dL — AB (ref 70–99)
Glucose-Capillary: 114 mg/dL — ABNORMAL HIGH (ref 70–99)
Glucose-Capillary: 137 mg/dL — ABNORMAL HIGH (ref 70–99)
Glucose-Capillary: 142 mg/dL — ABNORMAL HIGH (ref 70–99)
Glucose-Capillary: 148 mg/dL — ABNORMAL HIGH (ref 70–99)
Glucose-Capillary: 156 mg/dL — ABNORMAL HIGH (ref 70–99)
Glucose-Capillary: 156 mg/dL — ABNORMAL HIGH (ref 70–99)

## 2018-03-10 NOTE — Progress Notes (Signed)
Pt had a bath , inner cannula changed, suction PRN, changed bed cover, changed tube feeding set, no complain of pain, maintained 30 degrees head elevated, SCD, urostomy intact.

## 2018-03-10 NOTE — Progress Notes (Signed)
   ENT Progress Note: POD #2 s/p Procedure(s): DIRECT LARYNGOSCOPY WITH BIOPSY TRACHEOSTOMY   Subjective: Stable airway, no pain  Objective: Vital signs in last 24 hours: Temp:  [98 F (36.7 C)-98.4 F (36.9 C)] 98.2 F (36.8 C) (03/08 0459) Pulse Rate:  [74-112] 89 (03/08 0850) Resp:  [13-20] 18 (03/08 0850) BP: (123-139)/(83-90) 123/83 (03/08 0459) SpO2:  [96 %-100 %] 100 % (03/08 0850) FiO2 (%):  [28 %] 28 % (03/08 0850) Weight:  [96 kg] 96 kg (03/08 0500) Weight change:  Last BM Date: 03/06/18  Intake/Output from previous day: 03/07 0701 - 03/08 0700 In: 2750.2 [I.V.:2422; NG/GT:328.2] Out: 1325 [Urine:1325] Intake/Output this shift: Total I/O In: -  Out: 800 [Urine:800]  Labs: No results for input(s): WBC, HGB, HCT, PLT in the last 72 hours. No results for input(s): NA, K, CL, CO2, GLUCOSE, BUN, CALCIUM in the last 72 hours.  Invalid input(s): CREATININR  Studies/Results: Dg Abd Portable 1v  Result Date: 03/08/2018 CLINICAL DATA:  Feeding tube placement EXAM: PORTABLE ABDOMEN - 1 VIEW COMPARISON:  None. FINDINGS: Weighted tip of the enteric tube projects over the gastric body. There is gas distended bowel throughout the abdomen. Sutures in the right lower quadrant. IMPRESSION: Weighted tip enteric tube projects over the stomach. Electronically Signed   By: Ulyses Jarred M.D.   On: 03/08/2018 15:37     PHYSICAL EXAM: Trach in place Min secretions, airway stable   Assessment/Plan: Cont current airway care and monitoring Path pending    Jerrell Belfast 03/10/2018, 11:07 AM

## 2018-03-11 DIAGNOSIS — E440 Moderate protein-calorie malnutrition: Secondary | ICD-10-CM

## 2018-03-11 LAB — GLUCOSE, CAPILLARY
GLUCOSE-CAPILLARY: 175 mg/dL — AB (ref 70–99)
Glucose-Capillary: 157 mg/dL — ABNORMAL HIGH (ref 70–99)
Glucose-Capillary: 175 mg/dL — ABNORMAL HIGH (ref 70–99)
Glucose-Capillary: 169 mg/dL — ABNORMAL HIGH (ref 70–99)
Glucose-Capillary: 174 mg/dL — ABNORMAL HIGH (ref 70–99)

## 2018-03-11 NOTE — Care Management Note (Addendum)
Case Management Note  Patient Details  Name: Chad Olson MRN: [id number] Date of Birth: [birth year]  Subjective/Objective:                    Action/Plan:  Spoke to patient and wife Chad Olson at bedside.   PT to see patient today. Wife wants to take patient home at discharge with [place].   Wife states her and his sister plan to be both trained on trach care and tube feeds.  Explained respiratory therapy will do teaching on trach care and nursing on tube feeds prior to discharge.   Patient will be discharged with ambu bag and extra trach and portable suction on day of discharge. Other supplies will be delivered to home. Confirmed face sheet information.   Will need home health orders, face to face and DME orders. Will continue to follow.   Called Respiratory to arrange teaching. Spoke with Chad Olson due to staffing issues first available time will be Thursday . Chad Olson will call Chad Olson directly to arrange time.   Called referral to [facility] with [place] with [place]. Expected Discharge Date:                  Expected Discharge Plan:  [place]  In-House Referral:  Nutrition  Discharge planning Services  CM Consult  Post Acute Care Choice:  Home Health, Durable Medical Equipment Choice offered to:  Patient, Spouse  DME Arranged:  Trach supplies, Tube feeding pump, Tube feeding, Suction DME Agency:  [facility]  HH Arranged:    [place] Agency:  [place] ([facility])  Status of Service:  In process, will continue to follow  If discussed at Long Length of Stay Meetings, dates discussed:    Additional Comments:  Chad Favre, RN 03/11/2018, 1:43 PM

## 2018-03-11 NOTE — Progress Notes (Signed)
4 Days Post-Op   Subjective/Chief Complaint: He is doing reasonable. No bleeding from trach.    Objective: Vital signs in last 24 hours: Temp:  [98.1 F (36.7 C)-98.4 F (36.9 C)] 98.3 F (36.8 C) (03/09 0439) Pulse Rate:  [72-87] 72 (03/09 0905) Resp:  [14-18] 16 (03/09 0905) BP: (125-141)/(76-85) 141/85 (03/09 0439) SpO2:  [95 %-100 %] 96 % (03/09 0905) FiO2 (%):  [28 %] 28 % (03/09 0905) Weight:  [91.7 kg] 91.7 kg (03/09 0439) Last BM Date: 03/06/18  Intake/Output from previous day: 03/08 0701 - 03/09 0700 In: 6087.9 [I.V.:2814.7; NG/GT:3273.2] Out: 3250 [Urine:3250] Intake/Output this shift: No intake/output data recorded.  awake alert. trach is place and open. no bleeding. cv. regular. lungs- normal effort. abd- soft. ext no tenderness  Lab Results:  No results for input(s): WBC, HGB, HCT, PLT in the last 72 hours. BMET No results for input(s): NA, K, CL, CO2, GLUCOSE, BUN, CREATININE, CALCIUM in the last 72 hours. PT/INR No results for input(s): LABPROT, INR in the last 72 hours. ABG No results for input(s): PHART, HCO3 in the last 72 hours.  Invalid input(s): PCO2, PO2  Studies/Results: No results found.  Anti-infectives: Anti-infectives (From admission, onward)   None      Assessment/Plan: s/p Procedure(s): DIRECT LARYNGOSCOPY WITH BIOPSY (N/A) TRACHEOSTOMY (N/A) he needs a trach change to cuffless and will orde. he needs radiation oncology  consult. we discussed a PEG and they want to proceed.   LOS: 4 days    Chad Olson 03/11/2018

## 2018-03-11 NOTE — Plan of Care (Signed)
  Problem: Coping: Goal: Level of anxiety will decrease Outcome: Progressing   Problem: Nutrition: Goal: Adequate nutrition will be maintained Outcome: Progressing   

## 2018-03-11 NOTE — Progress Notes (Signed)
notified Dr. Janace Hoard of CBG 175. modified orders.

## 2018-03-11 NOTE — Progress Notes (Signed)
Nutrition Follow-up  DOCUMENTATION CODES:   Non-severe (moderate) malnutrition in context of chronic illness  INTERVENTION:   Continue Osmolite 1.5 @ 60 ml/hr via cortrak tube  Continue 30 ml Prostat TID.    Continue 200 ml free water flush every 4 hours  Tube feeding regimen provides 2460 kcal (100% of needs), 135 grams of protein, and 2297 ml of H2O.   NUTRITION DIAGNOSIS:   Moderate Malnutrition related to chronic illness as evidenced by mild muscle depletion, percent weight loss(20% weight loss within a year).  Ongoing  GOAL:   Patient will meet greater than or equal to 90% of their needs  Met with TF  MONITOR:   Labs, Weight trends, Skin, I & O's, TF tolerance  REASON FOR ASSESSMENT:   Rounds(New trach, failed swallow eval; Cortrak being placed)    ASSESSMENT:   73 yo male with PMH of HTN, HLD, bladder carcinoma, PVD, CKD who was admitted with large mass at base of tongue. S/P tracheostomy on admission.  3/6- cortrak tube placed (gastric) 3/7 transferred from ICU to floor; TF inititated  Reviewed I/O's: +2.8 L x 24 hours and +4.7 L since admission  Case discussed with RN, who reports pt has been advanced to TF goal rate and tolerating well.   Spoke with pt at bedside; pt able to communicate with head nods and hand gestures. Pt denies any nausea, vomiting, or abdominal pain. Discussed with pt and wife how pt is currently receiving his nutrition. Wife endorses that pt will likely undergo PEG placement sometime this week. Per ENT notes, pt will also require transition to cuffless trach. He is also awaiting radiation oncology consult.   Osmolite 1.5 infusing at goal rate of 60 ml/hr via cortrak tube. Pt also receive 30 ml Prostat TID and 200 ml free water flush every 4 hours. Complete regimen providing 2460 kcals, 135 grams protein, and 2297 ml free water daily, meeting 100% of estimated kcal and protein needs.   Labs reviewed: CBGS: 137-157.  Diet Order:    Diet Order            Diet NPO time specified  Diet effective now              EDUCATION NEEDS:   No education needs have been identified at this time  Skin:  Skin Assessment: Skin Integrity Issues: Skin Integrity Issues:: Incisions Incisions: closed neck, abdomen  Last BM:  03/06/18  Height:   Ht Readings from Last 1 Encounters:  03/07/18 5' 11" (1.803 m)    Weight:   Wt Readings from Last 1 Encounters:  03/11/18 91.7 kg    Ideal Body Weight:  78.2 kg  BMI:  Body mass index is 28.2 kg/m.  Estimated Nutritional Needs:   Kcal:  2200-2500  Protein:  125-150 gm  Fluid:  2.2 L     A. , RD, LDN, CDCES Registered Dietitian II Certified Diabetes Care and Education Specialist Pager: [phone] After hours Pager: [phone] 

## 2018-03-11 NOTE — Evaluation (Signed)
Physical Therapy Evaluation Patient Details Name: Chad Olson MRN: [id number] DOB: 01/22/1945 Today's Date: 03/11/2018   History of Present Illness  Pt admitted for biopsy of mass on bottom of tongue. PMH bladder cancer, PE, GERD. Currently with tracheostomy placed, urostomy in RLQ of abdomen and Cortrak.   Clinical Impression   Pt received in bed, willing to participate in therapy. History per pt's wife, who is in the room, and caretaker of Chad Olson as well as her mother. Overall functional mobility with min guard for safety (see details below). After session, pt indicated that he was having discomfort in his chest- RN in the room and aware.  Pt is moving very well overall; recommending HHPT to ensure pt is moving safely in the home and to progress his mobility safely. He will continue to benefit from skilled acute PT services to improve his functional mobility and allow safe DC home.  Pt and his wife are interested in pt being able to walk more frequently, which will benefit his functional mobility, loosen secretions, and prevent deconditioning due to hospital stay. He will benefit from working with mobility technicians during his acute care stay.    Follow Up Recommendations Home health PT    Equipment Recommendations  Rolling walker with 5" wheels    Recommendations for Other Services OT consult     Precautions / Restrictions Precautions Precautions: Fall Restrictions Weight Bearing Restrictions: No      Mobility  Bed Mobility Overal bed mobility: Needs Assistance Bed Mobility: Supine to Sit     Supine to sit: HOB elevated;Min guard     General bed mobility comments: Supervision for safety; no physical assist needed- use of PT's hand as rail to leverage trunk upright  Transfers Overall transfer level: Needs assistance Equipment used: Rolling walker (2 wheeled) Transfers: Sit to/from Stand Sit to Stand: Min guard         General transfer comment: Min guard  for safety, steadying once upright, verbal cues to increase trunk extension/reduce forward lean in standing  Ambulation/Gait Ambulation/Gait assistance: Min guard Gait Distance (Feet): 125 Feet Assistive device: Rolling walker (2 wheeled) Gait Pattern/deviations: Step-through pattern;Decreased stride length Gait velocity: decreased   General Gait Details: Min guard and RW for safety; one standing rest break; monitored HR response during walk- WNL  Stairs            Wheelchair Mobility    Modified Rankin (Stroke Patients Only)       Balance Overall balance assessment: Needs assistance Sitting-balance support: No upper extremity supported;Feet supported Sitting balance-Leahy Scale: Good     Standing balance support: Bilateral upper extremity supported;During functional activity Standing balance-Leahy Scale: Fair Standing balance comment: Pt requires bil UE support during ambulation but able to take steps to pivot and sit down without UE support, min guard                             Pertinent Vitals/Pain Pain Assessment: Faces Faces Pain Scale: Hurts a little bit Pain Location: chest Pain Intervention(s): Monitored during session;Limited activity within patient's tolerance    Home Living Family/patient expects to be discharged to:: Private residence Living Arrangements: Spouse/significant other Available Help at Discharge: Available 24 hours/day;Family Type of Home: House Home Access: Ramped entrance     Home Layout: One level Home Equipment: Environmental consultant - 2 wheels;Bedside commode;Cane - single point      Prior Function Level of Independence: Independent  Comments: Pt was ambulating without AD prior to admission     Hand Dominance        Extremity/Trunk Assessment   Upper Extremity Assessment Upper Extremity Assessment: Overall WFL for tasks assessed    Lower Extremity Assessment Lower Extremity Assessment: Overall WFL for tasks  assessed    Cervical / Trunk Assessment Cervical / Trunk Assessment: Normal  Communication   Communication: Tracheostomy;HOH(wears hearing aids)  Cognition Arousal/Alertness: Awake/alert Behavior During Therapy: WFL for tasks assessed/performed Overall Cognitive Status: Within Functional Limits for tasks assessed                                 General Comments: Pt responds well to commands and communicates by nodding yes/no      General Comments General comments (skin integrity, edema, etc.): Mobility loosened respiratory secretions and induced coughing; on 28% FiO2, SpO2 maintained 90-98%. HR range from 97 in supine to 124 during ambulation.    Exercises     Assessment/Plan    PT Assessment Patient needs continued PT services  PT Problem List Decreased strength;Decreased balance;Decreased mobility;Decreased knowledge of use of DME;Decreased activity tolerance;Decreased coordination;Decreased safety awareness       PT Treatment Interventions DME instruction;Functional mobility training;Balance training;Patient/family education;Gait training;Therapeutic activities;Neuromuscular re-education;Therapeutic exercise;Stair training    PT Goals (Current goals can be found in the Care Plan section)  Acute Rehab PT Goals Patient Stated Goal: pt unable to state goal- per wife: go home PT Goal Formulation: Patient unable to participate in goal setting Time For Goal Achievement: 03/25/18 Potential to Achieve Goals: Good    Frequency Min 3X/week   Barriers to discharge        Co-evaluation               AM-PAC PT "6 Clicks" Mobility  Outcome Measure Help needed turning from your back to your side while in a flat bed without using bedrails?: None Help needed moving from lying on your back to sitting on the side of a flat bed without using bedrails?: A Little Help needed moving to and from a bed to a chair (including a wheelchair)?: A Little Help needed standing  up from a chair using your arms (e.g., wheelchair or bedside chair)?: None Help needed to walk in hospital room?: A Little Help needed climbing 3-5 steps with a railing? : A Lot 6 Click Score: 19    End of Session Equipment Utilized During Treatment: Gait belt Activity Tolerance: Patient tolerated treatment well Patient left: with call bell/phone within reach;with family/visitor present;in chair Nurse Communication: Mobility status PT Visit Diagnosis: Unsteadiness on feet (R26.81);Other abnormalities of gait and mobility (R26.89);Muscle weakness (generalized) (M62.81)    Time: 5462-7035 PT Time Calculation (min) (ACUTE ONLY): 40 min   Charges:   PT Evaluation $PT Eval Moderate Complexity: 1 Mod PT Treatments $Gait Training: 23-37 mins        Ronnell Guadalajara, SPT   Ronnell Guadalajara 03/11/2018, 4:25 PM

## 2018-03-12 LAB — GLUCOSE, CAPILLARY
GLUCOSE-CAPILLARY: 182 mg/dL — AB (ref 70–99)
Glucose-Capillary: 199 mg/dL — ABNORMAL HIGH (ref 70–99)
Glucose-Capillary: 170 mg/dL — ABNORMAL HIGH (ref 70–99)
Glucose-Capillary: 145 mg/dL — ABNORMAL HIGH (ref 70–99)
Glucose-Capillary: 158 mg/dL — ABNORMAL HIGH (ref 70–99)
Glucose-Capillary: 192 mg/dL — ABNORMAL HIGH (ref 70–99)

## 2018-03-12 LAB — CBC WITH DIFFERENTIAL/PLATELET
Abs Immature Granulocytes: 0.03 10*3/uL (ref 0.00–0.07)
BASOS PCT: 0 %
Basophils Absolute: 0 10*3/uL (ref 0.0–0.1)
Eosinophils Absolute: 0.1 10*3/uL (ref 0.0–0.5)
Eosinophils Relative: 1 %
HCT: 45.2 % (ref 39.0–52.0)
Hemoglobin: 14.8 g/dL (ref 13.0–17.0)
Immature Granulocytes: 0 %
Lymphocytes Relative: 8 %
Lymphs Abs: 0.8 10*3/uL (ref 0.7–4.0)
MCH: 29.7 pg (ref 26.0–34.0)
MCHC: 32.7 g/dL (ref 30.0–36.0)
MCV: 90.6 fL (ref 80.0–100.0)
Monocytes Absolute: 1 10*3/uL (ref 0.1–1.0)
Monocytes Relative: 10 %
Neutro Abs: 7.9 10*3/uL — ABNORMAL HIGH (ref 1.7–7.7)
Neutrophils Relative %: 81 %
PLATELETS: 265 10*3/uL (ref 150–400)
RBC: 4.99 MIL/uL (ref 4.22–5.81)
RDW: 13.4 % (ref 11.5–15.5)
WBC: 9.8 10*3/uL (ref 4.0–10.5)
nRBC: 0 % (ref 0.0–0.2)

## 2018-03-12 LAB — PROTIME-INR
INR: 1.2 (ref 0.8–1.2)
Prothrombin Time: 15.2 seconds (ref 11.4–15.2)

## 2018-03-12 LAB — COMPREHENSIVE METABOLIC PANEL
ALT: 89 U/L — ABNORMAL HIGH (ref 0–44)
AST: 55 U/L — ABNORMAL HIGH (ref 15–41)
Albumin: 2.3 g/dL — ABNORMAL LOW (ref 3.5–5.0)
Alkaline Phosphatase: 120 U/L (ref 38–126)
Anion gap: 8 (ref 5–15)
BILIRUBIN TOTAL: 0.9 mg/dL (ref 0.3–1.2)
BUN: 17 mg/dL (ref 8–23)
CO2: 32 mmol/L (ref 22–32)
CREATININE: 1.18 mg/dL (ref 0.61–1.24)
Calcium: 8.5 mg/dL — ABNORMAL LOW (ref 8.9–10.3)
Chloride: 96 mmol/L — ABNORMAL LOW (ref 98–111)
GFR calc Af Amer: >60 mL/min (ref >60)
Glucose, Bld: 200 mg/dL — ABNORMAL HIGH (ref 70–99)
Potassium: 3.1 mmol/L — ABNORMAL LOW (ref 3.5–5.1)
Sodium: 136 mmol/L (ref 135–145)
Total Protein: 6.3 g/dL — ABNORMAL LOW (ref 6.5–8.1)

## 2018-03-12 MED ORDER — HYDROCHLOROTHIAZIDE 25 MG PO TABS
25.0000 mg | ORAL_TABLET | Freq: Every day | ORAL | Status: DC
  Administered 2018-03-14 – 2018-03-18 (×5): 25 mg
  Filled 2018-03-12 (×5): qty 1

## 2018-03-12 MED ORDER — ATORVASTATIN CALCIUM 40 MG PO TABS
40.0000 mg | ORAL_TABLET | Freq: Every day | ORAL | Status: DC
  Administered 2018-03-14 – 2018-03-18 (×5): 40 mg
  Filled 2018-03-12 (×5): qty 1

## 2018-03-12 MED ORDER — DONEPEZIL HCL 5 MG PO TABS
5.0000 mg | ORAL_TABLET | Freq: Every day | ORAL | Status: DC
  Administered 2018-03-12 – 2018-03-17 (×6): 5 mg
  Filled 2018-03-12 (×6): qty 1

## 2018-03-12 NOTE — Care Management Note (Signed)
Case Management Note  Patient Details  Name: Chad Olson MRN: [id number] Date of Birth: 10/03/1945  Subjective/Objective:                    Action/Plan:  Completed Tracheostomy Order Form for [employer] completed and faxed to [facility]. Expected Discharge Date:                  Expected Discharge Plan:  [place]  In-House Referral:  Nutrition  Discharge planning Services  CM Consult  Post Acute Care Choice:  Home Health, Durable Medical Equipment Choice offered to:  Patient, Spouse  DME Arranged:  Walker rolling, Tube feeding, Trach supplies, Suction, Tube feeding pump DME Agency:  [facility]  HH Arranged:  RN, PT, Disease Management, OT, Respirator Therapy HH Agency:  [facility]  Status of Service:  In process, will continue to follow  If discussed at Long Length of Stay Meetings, dates discussed:    Additional Comments:  Marilu Favre, RN 03/12/2018, 4:03 PM

## 2018-03-12 NOTE — Progress Notes (Signed)
Trach care done per RRT, site was soiled, cleansed with NS and sterile water, dried and clean dressing applied. IC was cleaned with trach cleaning kit. Wife is at bedside and was educated on the proper care of the trach cleaning process. RRT demonstrated the cleaning process. Wife verbalized understanding. Patient is stable at this time no distress or complications noted.  

## 2018-03-12 NOTE — Progress Notes (Signed)
5 Days Post-Op   Subjective/Chief Complaint: Doing well   Objective: Vital signs in last 24 hours: Temp:  [98.4 F (36.9 C)-98.5 F (36.9 C)] 98.4 F (36.9 C) (03/10 0445) Pulse Rate:  [63-95] 75 (03/10 1112) Resp:  [16-18] 18 (03/10 1112) BP: (127-135)/(69-86) 135/82 (03/10 0445) SpO2:  [93 %-100 %] 95 % (03/10 1112) FiO2 (%):  [28 %] 28 % (03/10 1112) Last BM Date: 03/06/18  Intake/Output from previous day: 03/09 0701 - 03/10 0700 In: 4592.8 [I.V.:2971.8; NG/GT:1621] Out: 1675 [Urine:1675] Intake/Output this shift: Total I/O In: 0  Out: 575 [Urine:575]  awake he is cough less and non purulent. The trach changed to 6 uncuffed without problem. he as a voice and could use passey muir  Lab Results:  No results for input(s): WBC, HGB, HCT, PLT in the last 72 hours. BMET No results for input(s): NA, K, CL, CO2, GLUCOSE, BUN, CREATININE, CALCIUM in the last 72 hours. PT/INR No results for input(s): LABPROT, INR in the last 72 hours. ABG No results for input(s): PHART, HCO3 in the last 72 hours.  Invalid input(s): PCO2, PO2  Studies/Results: No results found.  Anti-infectives: Anti-infectives (From admission, onward)   None      Assessment/Plan: s/p Procedure(s): DIRECT LARYNGOSCOPY WITH BIOPSY (N/A) TRACHEOSTOMY (N/A) passey Damita Lack may be helpful. He has PEG soon. He will get oncology appointment and called in. Trach training.   LOS: 5 days    Melissa Montane 03/12/2018
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
## 2018-03-12 NOTE — Progress Notes (Signed)
Physical Therapy Treatment Patient Details Name: Chad Olson MRN: [id number] DOB: [birth year] Today's Date: 03/12/2018    History of Present Illness Pt admitted for biopsy of mass on bottom of tongue. PMH bladder cancer, PE, GERD. Currently with tracheostomy placed, urostomy in RLQ of abdomen and Cortrak.    PT Comments    Patient seen for mobility progression. Pt tolerated gait training well and overall requires supervision/min guard for safety with OOB mobility.  Current plan remains appropriate.   Follow Up Recommendations  Home health PT     Equipment Recommendations  Rolling walker with 5" wheels    Recommendations for Other Services       Precautions / Restrictions Precautions Precautions: Fall    Mobility  Bed Mobility Overal bed mobility: Needs Assistance Bed Mobility: Supine to Sit     Supine to sit: HOB elevated;Min guard     General bed mobility comments: use of rails and HOB elevated  Transfers Overall transfer level: Needs assistance Equipment used: Rolling walker (2 wheeled) Transfers: Sit to/from Stand Sit to Stand: Min guard         General transfer comment: cues for safe hand placement  Ambulation/Gait Ambulation/Gait assistance: Min guard;Supervision Gait Distance (Feet): 150 Feet Assistive device: Rolling walker (2 wheeled) Gait Pattern/deviations: Step-through pattern;Decreased stride length(very guarded movements) Gait velocity: decreased   General Gait Details: cues to maintain safe proximity to RW and for upright posture; pt demonstrates good breathing technique and awareness to need for rest break; one standing rest break taken and SpO2 96% on 6L   Stairs             Wheelchair Mobility    Modified Rankin (Stroke Patients Only)       Balance Overall balance assessment: Needs assistance Sitting-balance support: No upper extremity supported;Feet supported Sitting balance-Leahy Scale: Good     Standing balance  support: Bilateral upper extremity supported;During functional activity Standing balance-Leahy Scale: Fair                              Cognition Arousal/Alertness: Awake/alert Behavior During Therapy: WFL for tasks assessed/performed Overall Cognitive Status: Within Functional Limits for tasks assessed                                 General Comments: Pt responds well to commands and communicates by nodding yes/no      Exercises      General Comments General comments (skin integrity, edema, etc.): pt encouraged to sit up OOB       Pertinent Vitals/Pain Pain Assessment: Faces Faces Pain Scale: Hurts little more Pain Location: unspecified; definitely sensitive around trach collar Pain Descriptors / Indicators: Grimacing;Guarding Pain Intervention(s): Monitored during session    Home Living                      Prior Function            PT Goals (current goals can now be found in the care plan section) Acute Rehab PT Goals Patient Stated Goal: pt unable to state goal- per wife: go home Progress towards PT goals: Progressing toward goals    Frequency    Min 3X/week      PT Plan Current plan remains appropriate    Co-evaluation              AM-PAC PT "6  Clicks" Mobility   Outcome Measure  Help needed turning from your back to your side while in a flat bed without using bedrails?: None Help needed moving from lying on your back to sitting on the side of a flat bed without using bedrails?: A Little Help needed moving to and from a bed to a chair (including a wheelchair)?: A Little Help needed standing up from a chair using your arms (e.g., wheelchair or bedside chair)?: None Help needed to walk in hospital room?: A Little Help needed climbing 3-5 steps with a railing? : A Little 6 Click Score: 20    End of Session Equipment Utilized During Treatment: Gait belt Activity Tolerance: Patient tolerated treatment  well Patient left: with call bell/phone within reach;with family/visitor present;in chair Nurse Communication: Mobility status PT Visit Diagnosis: Unsteadiness on feet (R26.81);Other abnormalities of gait and mobility (R26.89);Muscle weakness (generalized) (M62.81)     Time: 3536-1443 PT Time Calculation (min) (ACUTE ONLY): 37 min  Charges:  $Gait Training: 23-37 mins                     Earney Navy, PTA Acute Rehabilitation Services Pager: [phone] Office: [phone]     Darliss Cheney 03/12/2018, 3:53 PM

## 2018-03-12 NOTE — Progress Notes (Signed)
OT Cancellation Note  Patient Details Name: Chad Olson MRN: [id number] DOB: 1945-09-22   Cancelled Treatment:    Reason Eval/Treat Not Completed: Patient declined, no reason specified.  First Attempt @ 1510: Pt finishing with PT and wanting to rest. Will return as schedule allows.  Second Attempt @ 16:09: Pt just returning to bed after sitting in recliner and requesting to rest. Will return as schedule allows.   [place], OTR/L Acute Rehab Pager: [phone] Office: [phone] 03/12/2018, 4:12 PM

## 2018-03-12 NOTE — Progress Notes (Signed)
Trach education folders given to patient's spouse and sister.  Discussed with both the importance of reading the material and writing down any questions they may have prior to their trach education appointment on Thursday 03/14/18, so we can answer all of their questions.  Both ladies agreed and were appreciative for the information.

## 2018-03-13 ENCOUNTER — Encounter (HOSPITAL_COMMUNITY): Payer: Self-pay | Admitting: *Deleted

## 2018-03-13 ENCOUNTER — Inpatient Hospital Stay (HOSPITAL_COMMUNITY): Payer: Medicare HMO | Admitting: Certified Registered Nurse Anesthetist

## 2018-03-13 ENCOUNTER — Encounter (HOSPITAL_COMMUNITY)
Admission: RE | Disposition: A | Discharge status: Home Health | Payer: Self-pay | Source: Home / Self Care | Attending: Otolaryngology

## 2018-03-13 HISTORY — PX: ESOPHAGOGASTRODUODENOSCOPY (EGD) WITH PROPOFOL: SHX5813

## 2018-03-13 HISTORY — PX: PEG PLACEMENT: SHX5437

## 2018-03-13 LAB — GLUCOSE, CAPILLARY
GLUCOSE-CAPILLARY: 148 mg/dL — AB (ref 70–99)
GLUCOSE-CAPILLARY: 125 mg/dL — AB (ref 70–99)
Glucose-Capillary: 119 mg/dL — ABNORMAL HIGH (ref 70–99)
Glucose-Capillary: 137 mg/dL — ABNORMAL HIGH (ref 70–99)
Glucose-Capillary: 143 mg/dL — ABNORMAL HIGH (ref 70–99)
Glucose-Capillary: 215 mg/dL — ABNORMAL HIGH (ref 70–99)

## 2018-03-13 SURGERY — ESOPHAGOGASTRODUODENOSCOPY (EGD) WITH PROPOFOL
Anesthesia: Monitor Anesthesia Care

## 2018-03-13 MED ORDER — OSMOLITE 1.2 CAL PO LIQD
1000.0000 mL | ORAL | Status: DC
  Administered 2018-03-13: 1000 mL
  Filled 2018-03-13 (×4): qty 1000

## 2018-03-13 MED ORDER — PROPOFOL 10 MG/ML IV BOLUS
INTRAVENOUS | Status: DC | PRN
  Administered 2018-03-13: 20 mg via INTRAVENOUS

## 2018-03-13 MED ORDER — PROPOFOL 500 MG/50ML IV EMUL
INTRAVENOUS | Status: DC | PRN
  Administered 2018-03-13: 100 ug/kg/min via INTRAVENOUS

## 2018-03-13 MED ORDER — BACITRACIN-NEOMYCIN-POLYMYXIN 400-5-5000 EX OINT
1.0000 "application " | TOPICAL_OINTMENT | Freq: Every day | CUTANEOUS | Status: DC
  Administered 2018-03-13 – 2018-03-18 (×4): 1 via TOPICAL
  Filled 2018-03-13 (×5): qty 1

## 2018-03-13 MED ORDER — SODIUM CHLORIDE 0.9 % IV SOLN: INTRAVENOUS | Status: DC

## 2018-03-13 MED ORDER — LACTATED RINGERS IV SOLN
INTRAVENOUS | Status: DC | PRN
  Administered 2018-03-13: 09:00:00 via INTRAVENOUS

## 2018-03-13 MED ORDER — CIPROFLOXACIN IN D5W 400 MG/200ML IV SOLN
INTRAVENOUS | Status: AC
  Filled 2018-03-13: qty 200

## 2018-03-13 MED ORDER — CIPROFLOXACIN IN D5W 400 MG/200ML IV SOLN
400.0000 mg | Freq: Once | INTRAVENOUS | Status: AC
  Administered 2018-03-13: 400 mg via INTRAVENOUS

## 2018-03-13 SURGICAL SUPPLY — 15 items

## 2018-03-13 NOTE — Op Note (Signed)
[facility] Patient Name: Chad Olson Procedure Date : 03/13/2018 MRN: [id number] Attending MD: Clarene Essex , MD Date of Birth: 10/29/1945 CSN: [id number] Age: 73 Admit Type: Inpatient Procedure:                Upper GI endoscopy Indications:              Place PEG because patient is unable to eat, Place                            PEG due to impaired swallowing Providers:                Clarene Essex, MD, Cleda Daub, RN, Charolette Child,                            Technician, Clearnce Sorrel, CRNA Referring MD:              Medicines:                Propofol total dose 891 mg IV Complications:            No immediate complications. Estimated Blood Loss:     Estimated blood loss: none. Procedure:                Pre-Anesthesia Assessment:                           - Prior to the procedure, a History and Physical                            was performed, and patient medications and                            allergies were reviewed. The patient's tolerance of                            previous anesthesia was also reviewed. The risks                            and benefits of the procedure and the sedation                            options and risks were discussed with the patient.                            All questions were answered, and informed consent                            was obtained. Prior Anticoagulants: The patient has                            taken no previous anticoagulant or antiplatelet                            agents. ASA Grade Assessment: II - A patient with  mild systemic disease. After reviewing the risks                            and benefits, the patient was deemed in                            satisfactory condition to undergo the procedure.                           After obtaining informed consent, the endoscope was                            passed under direct vision. Throughout the   procedure, the patient's blood pressure, pulse, and                            oxygen saturations were monitored continuously. The                            GIF-H190 ([id number]) Olympus gastroscope was                            introduced through the mouth, and advanced to the                            second part of duodenum. The upper GI endoscopy was                            accomplished without difficulty. The patient                            tolerated the procedure well. Scope In: Scope Out: Findings:      A tiny hiatal hernia was present.      Multiple diminutive semi-sessile polyps were found on the greater       curvature of the stomach and on the lesser curvature of the stomach.      The greater curvature of the stomach was normal. Placement of an       externally removable PEG with no T-fasteners was successfully completed.       The external bumper was at the 3.0 cm marking on the tube.      The duodenal bulb, first portion of the duodenum and second portion of       the duodenum were normal.      The exam was otherwise without abnormality. Impression:               - Tiny hiatal hernia.                           - Multiple gastric polyps.                           - Normal greater curvature of the stomach.                           - Normal duodenal bulb, first portion of the  duodenum and second portion of the duodenum.                           - The examination was otherwise normal.                           - An externally removable PEG placement was                            successfully completed.                           - No specimens collected. Recommendation:           - Please follow the post-PEG recommendations                            including: change dressing once per day, may use                            PEG today for meds and water now, start using PEG                            today in 4 hrs if ok, antibiotic ointment  to site                            and clean site with soap and water daily and dry                            thoroughly.                           - Return to GI clinic PRN.                           - Telephone GI clinic if symptomatic PRN. Procedure Code(s):        --- Professional ---                           [phone], Esophagogastroduodenoscopy, flexible,                            transoral; with directed placement of percutaneous                            gastrostomy tube Diagnosis Code(s):        --- Professional ---                           K44.9, Diaphragmatic hernia without obstruction or                            gangrene                           K31.7, Polyp of stomach and duodenum  R63.3, Feeding difficulties                           Z43.1, Encounter for attention to gastrostomy                           R13.10, Dysphagia, unspecified CPT copyright 2018 [employer]. All rights reserved. The codes documented in this report are preliminary and upon coder review may  be revised to meet current compliance requirements. Clarene Essex, MD 03/13/2018 9:47:09 AM This report has been signed electronically. Number of Addenda: 0

## 2018-03-13 NOTE — Transfer of Care (Signed)
Immediate Anesthesia Transfer of Care Note  Patient: Chad Olson  Procedure(s) Performed: ESOPHAGOGASTRODUODENOSCOPY (EGD) WITH PROPOFOL (N/A ) PERCUTANEOUS ENDOSCOPIC GASTROSTOMY (PEG) PLACEMENT (N/A )  Patient Location: Endoscopy Unit  Anesthesia Type:MAC  Level of Consciousness: awake  Airway & Oxygen Therapy: Patient Spontanous Breathing and Patient connected to T-piece oxygen  Post-op Assessment: Report given to RN and Post -op Vital signs reviewed and stable  Post vital signs: Reviewed and stable  Last Vitals:  Vitals Value Taken Time  BP 103/68 03/13/2018  9:49 AM  Temp    Pulse 77 03/13/2018  9:49 AM  Resp 19 03/13/2018  9:49 AM  SpO2 100 % 03/13/2018  9:49 AM    Last Pain:  Vitals:   03/13/18 0949  TempSrc:   PainSc: 0-No pain      Patients Stated Pain Goal: 3 (23/30/07 6226)  Complications: No apparent anesthesia complications

## 2018-03-13 NOTE — Progress Notes (Signed)
Did education on new PEG tube (cleaning around site, flushing and what to do if tube becomes clogged) as well as trach care with wife. I reviewed use of foam dressing under the trach base to prevent any pressure injury.   Wife is very receptive to learning.  Pt suctioned twice but is able to get his thick secretions out with productive coughing.

## 2018-03-13 NOTE — Progress Notes (Signed)
Speech Pathology- Trach team   Chart reviewed as part of trach team. Read Dr. Janace Hoard note re: pt may benefit from Passy-Muir speaking valve. Please order if desired.     [name] [place].Ed Risk analyst [phone] Office [phone]

## 2018-03-13 NOTE — Progress Notes (Signed)
OT Cancellation Note  Patient Details Name: Chad Olson MRN: [id number] DOB: 28-Mar-1945   Cancelled Treatment:    Reason Eval/Treat Not Completed: Patient at procedure or test/ unavailable(Endo. Will return as schedule allows. Thank you.)  [place], OTR/L Acute Rehab Pager: [phone] Office: [phone] 03/13/2018, 8:31 AM

## 2018-03-13 NOTE — Evaluation (Addendum)
Occupational Therapy Evaluation Patient Details Name: Chad Olson MRN: [id number] DOB: Mar 03, 1945 Today's Date: 03/13/2018    History of Present Illness Pt admitted for biopsy of mass on bottom of tongue. PMH dementia, bladder cancer, PE, GERD. Currently with tracheostomy placed, urostomy in RLQ of abdomen and Cortrak.   Clinical Impression   PTA, pt was living with his wife and was performing BADLs with wife assisting as needed due to fatigue. Pt currently requiring Min A for UB ADLs, Mod A for LB ADLs, and Min Guard-Min A for functional mobility with RW. Pt presenting with decreased safety, strength, balance, and activity tolerance. Pt's wife very supportive and encouraging pt to participate in therapy. Pt would benefit from further acute OT to facilitate safe dc. Recommend dc to home with HHOT for further OT to optimize safety, independence with ADLs, and return to PLOF.       Follow Up Recommendations  Home health OT;Supervision/Assistance - 24 hour    Equipment Recommendations  3 in 1 bedside commode(Wife planning on measuring width for fit in bathroom)    Recommendations for Other Services PT consult     Precautions / Restrictions Precautions Precautions: Fall Restrictions Weight Bearing Restrictions: No      Mobility Bed Mobility Overal bed mobility: Needs Assistance Bed Mobility: Supine to Sit;Sit to Supine     Supine to sit: Min assist;HOB elevated Sit to supine: Min guard   General bed mobility comments: Min A to elevate trunk  Transfers Overall transfer level: Needs assistance Equipment used: Rolling walker (2 wheeled) Transfers: Sit to/from Stand Sit to Stand: Min guard;Min assist         General transfer comment: Min A for power up into standing. Min Guard A for safe descent    Balance Overall balance assessment: Needs assistance Sitting-balance support: No upper extremity supported;Feet supported Sitting balance-Leahy Scale: Fair      Standing balance support: Bilateral upper extremity supported;During functional activity Standing balance-Leahy Scale: Fair                             ADL either performed or assessed with clinical judgement   ADL Overall ADL's : Needs assistance/impaired Eating/Feeding: NPO   Grooming: Min guard;Standing;Wash/dry face   Upper Body Bathing: Minimal assistance;Sitting   Lower Body Bathing: Moderate assistance;Sit to/from stand   Upper Body Dressing : Minimal assistance;Sitting Upper Body Dressing Details (indicate cue type and reason): Donned second gown like jacket Lower Body Dressing: Moderate assistance;Sit to/from stand Lower Body Dressing Details (indicate cue type and reason): Mod A due to decreased tolerance bending forward due to pain at surgery site Toilet Transfer: Min guard;Minimal assistance;Ambulation;RW(simulated in room) Toilet Transfer Details (indicate cue type and reason): Min A to power up         Functional mobility during ADLs: Min guard;Rolling walker General ADL Comments: Pt presenting with decreased strength, balance, and activity tolerance.      Vision         Perception     Praxis      Pertinent Vitals/Pain Pain Assessment: Faces Faces Pain Scale: Hurts little more Pain Location: unspecified; definitely sensitive around trach collar Pain Descriptors / Indicators: Grimacing;Guarding Pain Intervention(s): Monitored during session;Limited activity within patient's tolerance;Repositioned     Hand Dominance     Extremity/Trunk Assessment Upper Extremity Assessment Upper Extremity Assessment: Overall WFL for tasks assessed   Lower Extremity Assessment Lower Extremity Assessment: Defer to PT evaluation  Cervical / Trunk Assessment Cervical / Trunk Assessment: Normal   Communication Communication Communication: Tracheostomy;HOH(wears hearing aids)   Cognition Arousal/Alertness: Awake/alert Behavior During Therapy: WFL for  tasks assessed/performed Overall Cognitive Status: History of cognitive impairments - at baseline                                 General Comments: Pt responds well to commands and communicates by nodding yes/no. Baseline dementia per wife   General Comments  Wife present throughotu session. SpO2 96-94% on 6L    Exercises     Shoulder Instructions      Home Living Family/patient expects to be discharged to:: Private residence Living Arrangements: Spouse/significant other Available Help at Discharge: Available 24 hours/day;Family Type of Home: House Home Access: Ramped entrance     Home Layout: One level     Bathroom Shower/Tub: Hospital doctor Toilet: Handicapped height     Home Equipment: Environmental consultant - 2 wheels;Cane - single point          Prior Functioning/Environment Level of Independence: Independent        Comments: Pt was ambulating without AD prior to admission        OT Problem List: Decreased strength;Decreased range of motion;Decreased activity tolerance;Impaired balance (sitting and/or standing);Decreased safety awareness;Decreased knowledge of use of DME or AE;Decreased knowledge of precautions;Pain      OT Treatment/Interventions: Self-care/ADL training;Therapeutic exercise;Energy conservation;DME and/or AE instruction;Therapeutic activities;Patient/family education    OT Goals(Current goals can be found in the care plan section) Acute Rehab OT Goals Patient Stated Goal: pt unable to state goal- per wife: go home OT Goal Formulation: With patient/family Time For Goal Achievement: 03/27/18 Potential to Achieve Goals: Good  OT Frequency: Min 3X/week   Barriers to D/C:            Co-evaluation              AM-PAC OT "6 Clicks" Daily Activity     Outcome Measure Help from another person eating meals?: Total Help from another person taking care of personal grooming?: A Little Help from another person toileting, which  includes using toliet, bedpan, or urinal?: A Little Help from another person bathing (including washing, rinsing, drying)?: A Lot Help from another person to put on and taking off regular upper body clothing?: A Little Help from another person to put on and taking off regular lower body clothing?: A Lot 6 Click Score: 14   End of Session Equipment Utilized During Treatment: Rolling walker;Oxygen Nurse Communication: Mobility status  Activity Tolerance: Patient tolerated treatment well Patient left: in bed;with call bell/phone within reach;with family/visitor present  OT Visit Diagnosis: Unsteadiness on feet (R26.81);Other abnormalities of gait and mobility (R26.89);Muscle weakness (generalized) (M62.81);Pain Pain - Right/Left: Left Pain - part of body: (Stomach)                Time: 7322-0254 OT Time Calculation (min): 40 min Charges:  OT General Charges $OT Visit: 1 Visit OT Evaluation $OT Eval Moderate Complexity: 1 Mod OT Treatments $Self Care/Home Management : 23-37 mins  Joakim Huesman MSOT, OTR/L Acute Rehab Pager: [phone] Office: [place] 03/13/2018, 4:21 PM

## 2018-03-13 NOTE — Progress Notes (Signed)
Pt to Endo via bed with Kathlee Nations, RN for PEG tube placement.

## 2018-03-13 NOTE — Progress Notes (Signed)
I spoke with the patient's wife by phone to discuss our role in the care of her husband. I will see them early tomorrow morning for formal consultation. After brief call, she indicated that their family's greatest concern as they consider this new diagnosis and consider the complete work up and subsequent treatment offerings, is to keep his quality of life as the primary goal. Apparently he's been showing some signs of dementia and she "does not want to put him through treatment that would make his quality of life suffer." We briefly discussed that formal staging with outpatient PET imaging would be the standard way to document his stage. I believe that he has at least T4aN3 disease which could involve chemoRT versus palliative options. She would like to have further work up with PET imaging as an outpatient, and to meet with Dr. Isidore Moos thereafter, but would also appreciate palliative care consult while inpt to discuss goals of care. I briefly discussed side effects profiles of radiotherapy as well.     Carola Rhine, PAC

## 2018-03-13 NOTE — Anesthesia Preprocedure Evaluation (Signed)
Anesthesia Evaluation  Patient identified by MRN, date of birth, ID band Patient awake    Reviewed: Allergy & Precautions, NPO status , Patient's Chart, lab work & pertinent test results  Airway Mallampati: Trach       Dental  (+) Poor Dentition, Chipped,    Pulmonary neg pulmonary ROS,    breath sounds clear to auscultation       Cardiovascular hypertension, Pt. on medications + Peripheral Vascular Disease and + DVT   Rhythm:Regular Rate:Normal     Neuro/Psych Dementia negative neurological ROS     GI/Hepatic Neg liver ROS, GERD  Medicated,  Endo/Other  negative endocrine ROS  Renal/GU Renal InsufficiencyRenal disease     Musculoskeletal  (+) Arthritis ,   Abdominal Normal abdominal exam  (+)   Peds  Hematology negative hematology ROS (+)   Anesthesia Other Findings Patient now with tracheostomy in place. Otherwise no interval change in health since recent anesthetic.  Reproductive/Obstetrics                            Lab Results  Component Value Date   WBC 9.8 03/12/2018   HGB 14.8 03/12/2018   HCT 45.2 03/12/2018   MCV 90.6 03/12/2018   PLT 265 03/12/2018   Lab Results  Component Value Date   CREATININE 1.18 03/12/2018   BUN 17 03/12/2018   NA 136 03/12/2018   K 3.1 (L) 03/12/2018   CL 96 (L) 03/12/2018   CO2 32 03/12/2018   Lab Results  Component Value Date   INR 1.2 03/12/2018   INR 1.08 12/15/2014   INR 1.09 07/15/2012   EKG: normal sinus rhythm.  Anesthesia Physical  Anesthesia Plan  ASA: III  Anesthesia Plan: MAC   Post-op Pain Management:    Induction: Intravenous  PONV Risk Score and Plan: 2 and Propofol infusion, TIVA and Treatment may vary due to age or medical condition  Airway Management Planned: Tracheostomy  Additional Equipment: None  Intra-op Plan:   Post-operative Plan:   Informed Consent: I have reviewed the patients History and  Physical, chart, labs and discussed the procedure including the risks, benefits and alternatives for the proposed anesthesia with the patient or authorized representative who has indicated his/her understanding and acceptance.       Plan Discussed with: CRNA  Anesthesia Plan Comments:         Anesthesia Quick Evaluation

## 2018-03-13 NOTE — Progress Notes (Signed)
Day of Surgery   Subjective/Chief Complaint: Sleepy from surgery   Objective: Vital signs in last 24 hours: Temp:  [97.3 F (36.3 C)-98.6 F (37 C)] 98.3 F (36.8 C) (03/11 1027) Pulse Rate:  [70-102] 70 (03/11 1027) Resp:  [11-20] 19 (03/11 0949) BP: (103-140)/(68-84) 126/73 (03/11 1027) SpO2:  [94 %-100 %] 100 % (03/11 1027) FiO2 (%):  [28 %] 28 % (03/11 1136) Weight:  [94.4 kg] 94.4 kg (03/11 0654) Last BM Date: 03/06/18  Intake/Output from previous day: 03/10 0701 - 03/11 0700 In: 3968.7 [I.V.:2869.7; NG/GT:1099] Out: 3700 [Urine:3700] Intake/Output this shift: Total I/O In: 200 [I.V.:200] Out: 0.5 [Blood:0.5]  trach open and no bleeding. good airway. lungs clear ext no tenderness  Lab Results:  Recent Labs    03/12/18 1833  WBC 9.8  HGB 14.8  HCT 45.2  PLT 265   BMET Recent Labs    03/12/18 1833  NA 136  K 3.1*  CL 96*  CO2 32  GLUCOSE 200*  BUN 17  CREATININE 1.18  CALCIUM 8.5*   PT/INR Recent Labs    03/12/18 1833  LABPROT 15.2  INR 1.2   ABG No results for input(s): PHART, HCO3 in the last 72 hours.  Invalid input(s): PCO2, PO2  Studies/Results: No results found.  Anti-infectives: Anti-infectives (From admission, onward)   Start     Dose/Rate Route Frequency Ordered Stop   03/13/18 0845  ciprofloxacin (CIPRO) IVPB 400 mg     400 mg 200 mL/hr over 60 Minutes Intravenous  Once 03/13/18 0838 03/13/18 0902      Assessment/Plan: s/p Procedure(s): ESOPHAGOGASTRODUODENOSCOPY (EGD) WITH PROPOFOL (N/A) PERCUTANEOUS ENDOSCOPIC GASTROSTOMY (PEG) PLACEMENT (N/A) he has a new PEG. he has a rad onc consult placed yesterday. 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 trach training today. arranging home  LOS: 6 days    Melissa Montane 03/13/2018

## 2018-03-13 NOTE — Progress Notes (Signed)
Pt off the floor at this time. Trach assessment not performed at this time. RT will check back at later time to assess pt.

## 2018-03-13 NOTE — Progress Notes (Signed)
Pt received back from Endo s/p PEG tube placement.  Cardiac monitoring replaced.

## 2018-03-13 NOTE — Progress Notes (Addendum)
Nutrition Follow-up  DOCUMENTATION CODES:   Non-severe (moderate) malnutrition in context of chronic illness  INTERVENTION:   -Once PEG is cleared for use, resume:  Continue Osmolite 1.5 @ 60 ml/hr via PEG  Continue 30 ml Prostat TID.    Continue 200 ml free water flush every 4 hours  Tube feeding regimen provides 2460 kcal (100% of needs), 135 grams of protein, and 2297 ml of H2O.   -If pt desires nocturnal TF, recommend:  Initiate Osmolite 1.5 @ 120 ml/hr via PEG over 12 hours  30 ml Prostat TID (or equivalent).    200 ml free water flush every 4 hours  Tube feeding regimen provides 2460 kcal (100% of needs), 135 grams of protein, and 2259ml of H2O.   -If pt desires bolus feedings, recommend:  Initiate bolus feedings of 250 ml of Osmolite 1.5  6 times daily via PEG  30 ml Prostat TID.    90 ml free water flush before and after each feeding administration  Tube feeding regimen provides 2550 kcal (100% of needs), 139 grams of protein, and 2223 ml of H2O.    NUTRITION DIAGNOSIS:   Moderate Malnutrition related to chronic illness as evidenced by mild muscle depletion, percent weight loss(20% weight loss within a year).  Ongoing  GOAL:   Patient will meet greater than or equal to 90% of their needs  Progressing  MONITOR:   Labs, Weight trends, Skin, I & O's, TF tolerance  REASON FOR ASSESSMENT:   Consult Enteral/tube feeding initiation and management  ASSESSMENT:   73 yo male with PMH of HTN, HLD, bladder carcinoma, PVD, CKD who was admitted with large mass at base of tongue. S/P tracheostomy on admission.  3/6- cortrak tube placed (gastric) 3/7 transferred from ICU to floor; TF initiated 3/11- s/p PEG placement, cortrak removed  Reviewed I/O's: +269 ml x 24 hours and +7.9 L since admission  Case discussed with OT, RN, and RNCM. Pt is making great progress. He just received PEG placement today. Per discussion with RNCM, pt may discharge within the  next few days and is awaiting TF recommendations for home.   TF currently off, but suspect TF will be resume tomorrow once tube is cleared for use.   Pt resting quietly at time of visit and did not wake. Spoke with pt's wife at bedside. She shares she had a long discussion with ENT earlier this morning and have decided not to purse further cancer treatments and would like to focus on pt's quality of life. She definitely wants to continue to pursue TF, but also reports she may allow pt to eat for comfort.   Discussed with pt wife options for home TF (continuous, nocturnal vs bolus). Pt wife is unsure which route she would like to pursue, but would likely be nocturnal or bolus feedings. She feels uncomfortable making a concrete decision at this time without the input of her sister-in-law, who will be present tomorrow for trach care draining and will be caring for pt as well.   Informed RNCM regarding pt wife decision, who reports she will reach out tomorrow. RD will provide recommendations for both bolus and nocturnal feedings.   Labs reviewed: CBGS: 143-215.   Diet Order:   Diet Order            Diet NPO time specified  Diet effective now              EDUCATION NEEDS:   No education needs have been identified at this  time  Skin:  Skin Assessment: Skin Integrity Issues: Skin Integrity Issues:: Incisions Incisions: closed neck, abdomen  Last BM:  03/06/18  Height:   Ht Readings from Last 1 Encounters:  03/07/18 5\' 11"  (1.803 m)    Weight:   Wt Readings from Last 1 Encounters:  03/13/18 94.4 kg    Ideal Body Weight:  78.2 kg  BMI:  Body mass index is 29.03 kg/m.  Estimated Nutritional Needs:   Kcal:  2200-2500  Protein:  125-150 gm  Fluid:  2.2 L    Lynsie Mcwatters A. Jimmye Norman, RD, LDN, [place] Registered Dietitian II Certified Diabetes Care and Education Specialist Pager: [phone] After hours Pager: [phone]

## 2018-03-13 NOTE — Progress Notes (Signed)
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

## 2018-03-14 ENCOUNTER — Ambulatory Visit
Admit: 2018-03-14 | Discharge: 2018-03-14 | Disposition: A | Discharge status: Home / Self Care | Payer: Medicare HMO | Attending: Radiation Oncology | Admitting: Radiation Oncology

## 2018-03-14 DIAGNOSIS — C01 Malignant neoplasm of base of tongue: Secondary | ICD-10-CM

## 2018-03-14 DIAGNOSIS — Z7189 Other specified counseling: Secondary | ICD-10-CM

## 2018-03-14 DIAGNOSIS — Z515 Encounter for palliative care: Secondary | ICD-10-CM

## 2018-03-14 LAB — GLUCOSE, CAPILLARY
Glucose-Capillary: 128 mg/dL — ABNORMAL HIGH (ref 70–99)
Glucose-Capillary: 168 mg/dL — ABNORMAL HIGH (ref 70–99)
Glucose-Capillary: 169 mg/dL — ABNORMAL HIGH (ref 70–99)
Glucose-Capillary: 169 mg/dL — ABNORMAL HIGH (ref 70–99)
Glucose-Capillary: 181 mg/dL — ABNORMAL HIGH (ref 70–99)
Glucose-Capillary: 185 mg/dL — ABNORMAL HIGH (ref 70–99)

## 2018-03-14 MED ORDER — MUPIROCIN CALCIUM 2 % EX CREA
TOPICAL_CREAM | Freq: Two times a day (BID) | CUTANEOUS | Status: DC
  Administered 2018-03-14 – 2018-03-18 (×9): via TOPICAL
  Filled 2018-03-14: qty 15

## 2018-03-14 NOTE — Progress Notes (Signed)
PT Cancellation Note  Patient Details Name: Chad Olson MRN: [id number] DOB: Oct 03, 1945   Cancelled Treatment:    Reason Eval/Treat Not Completed: Patient declined, no reason specified Pt's family reports pt is finally resting and not coughing and requesting to hold PT for today. Will follow up as schedule allows.   Leighton Ruff, PT, DPT  Acute Rehabilitation Services  Pager: [phone] Office: [phone]    Rudean Hitt 03/14/2018, 4:17 PM
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
## 2018-03-14 NOTE — Anesthesia Postprocedure Evaluation (Signed)
Anesthesia Post Note  Patient: Chad Olson  Procedure(s) Performed: ESOPHAGOGASTRODUODENOSCOPY (EGD) WITH PROPOFOL (N/A ) PERCUTANEOUS ENDOSCOPIC GASTROSTOMY (PEG) PLACEMENT (N/A )     Patient location during evaluation: Endoscopy Anesthesia Type: MAC Level of consciousness: awake and alert Pain management: pain level controlled Vital Signs Assessment: post-procedure vital signs reviewed and stable Respiratory status: spontaneous breathing, nonlabored ventilation and respiratory function stable Cardiovascular status: blood pressure returned to baseline and stable Postop Assessment: no apparent nausea or vomiting Anesthetic complications: no    Last Vitals:  Vitals:   03/14/18 0439 03/14/18 0930  BP: 126/72   Pulse: 80 90  Resp: 16 15  Temp: 36.8 C   SpO2: 96% 95%    Last Pain:  Vitals:   03/14/18 0439  TempSrc: Oral  PainSc:                  Lidia Collum

## 2018-03-14 NOTE — Care Management Note (Signed)
Case Management Note  Patient Details  Name: Chad Olson MRN: [id number] Date of Birth: [birth year]  Subjective/Objective:                    Action/Plan:  Spoke with patient, wife and sister at bedside. They have decided on bolus tube feeds at home. Entered order for same. MD will need to sign home health orders , face to face and tube feeding order. Expected Discharge Date:                  Expected Discharge Plan:  [place]  In-House Referral:  Nutrition  Discharge planning Services  CM Consult  Post Acute Care Choice:  Home Health, Durable Medical Equipment Choice offered to:  Patient, Spouse  DME Arranged:  Walker rolling, Tube feeding, Trach supplies, Suction, Tube feeding pump DME Agency:  [facility]  HH Arranged:  RN, PT, Disease Management, OT, Respirator Therapy HH Agency:  [facility] ([facility]  Status of Service:  In process, will continue to follow  If discussed at Long Length of Stay Meetings, dates discussed:    Additional Comments:  Marilu Favre, RN 03/14/2018, 10:19 AM

## 2018-03-14 NOTE — Progress Notes (Signed)
Patient ID: Chad Olson, male   DOB: March 18, 1945, 73 y.o.   MRN: [id number] He is doing well.  Good night last night.  They have undergone trach training.  They have seen oncology and decided not to proceed with any treatment.  Palliative care is seeing them.  He needs a hospital bed at the family's request for home.  Another trach care session will be entertained tomorrow.  He should be able to be discharged very soon.  His exam is unchanged with good airway.  There is a irritated area where the suture was and antibiotic cream can be applied to that.

## 2018-03-14 NOTE — Care Management (Signed)
    Durable Medical Equipment  (From admission, onward)         Start     Ordered   03/14/18 1244  For home use only DME Hospital bed  Once    Question:  Bed type  Answer:  Semi-electric   03/14/18 1244   03/14/18 1015  For home use only DME Tube feeding  Once    Comments:  Osmolite 1.5 or equivalent  Initiate bolus feedings of 250 ml of Osmolite 1.5  6 times daily via PEG  30 ml Prostat or equivalent TID.    90 ml free water flush before and after each feeding administration  Tube feeding regimen provides 2550 kcal (100% of needs), 139 grams of protein, and 2223 ml of H2O.    NUTRITION DIAGNOSIS:   Moderate Malnutrition related to chronic illness as evidenced by mild muscle depletion, percent weight loss(20% weight loss within a year).  Ongoing  GOAL:   Patient will meet greater than or equal to 90% of their needs   Patient will require tube feeding greater than 90 days   03/14/18 1016   03/12/18 0846  For home use only DME Walker rolling  Once    Question:  Patient needs a walker to treat with the following condition  Answer:  Tracheostomy care ([place])   03/12/18 0848

## 2018-03-14 NOTE — Progress Notes (Signed)
Leane Para 6:21 PM  Subjective: Patient doing great from his PEG and he and his wife are in good spirits and we discussed taking care of the PEG loosening the bumper in 1 week etc.  Objective: Vital signs stable afebrile site looks good no acute distress nontender  Assessment: Status post PEG  Plan: Happy to see back as needed they will call me if any PEG related question or problems and I wished them well  [facility]  Pager [phone] After 5PM or if no answer call [phone]

## 2018-03-14 NOTE — Progress Notes (Signed)
Respiratory note:Trach Education.  Aniketh Huberty (patient's wife) and Butch Penny (patient's sister).were present.  Both Mrs Parke and Butch Penny were comfortable using sterile technique to suction patient's trach.  Mr. Schwandt, changed dressing and assisted changing the trach ties. Both Mrs. Allston and Butch Penny understand that when changing trach ties, both are to help each other; Butch Penny stabilized the trach in place while Mrs. Chong changed the trach ties. It was stressed to NOT change trach ties by unless both parties were present to ensure that trach was held in place. Trach inner cannula was cleaned. Mrs Seydel and Butch Penny state they are comfortable with the procedures which were demonstrated but feel that they would like more practice and review to make sure they feel confident caring for Mr Reinig.  RN was informed that the that the Lurline Idol site was very red and had purulent drainage.

## 2018-03-14 NOTE — Care Management Note (Signed)
Case Management Note  Patient Details  Name: OCTAVIO MATHENEY MRN: [id number] Date of Birth: 09-12-1945  Subjective/Objective:                    Action/Plan: Consult for Palliative Care Services at Home. Spoke to patient, wife and sister at bedside provided list. First choice was  [facility], however,  currently they do not have a Palliative Program per [employer].  Second choice [facility]. Referral called to [facility] will call PCP for orders.  [facility] updated.  Expected Discharge Date:                  Expected Discharge Plan:  [place]  In-House Referral:  Nutrition  Discharge planning Services  CM Consult  Post Acute Care Choice:  Home Health, Durable Medical Equipment Choice offered to:  Patient, Spouse  DME Arranged:  Walker rolling, Tube feeding, Trach supplies, Suction, Tube feeding pump DME Agency:  [facility]  HH Arranged:  RN, PT, Disease Management, OT, Respirator Therapy HH Agency:  [facility] ([facility]  Status of Service:  In process, will continue to follow  If discussed at Long Length of Stay Meetings, dates discussed:    Additional Comments:  Marilu Favre, RN 03/14/2018, 2:10 PM

## 2018-03-14 NOTE — Progress Notes (Addendum)
Wife and sister shown how bolus tube feeds will be given while I was giving him his meds.

## 2018-03-15 LAB — GLUCOSE, CAPILLARY
Glucose-Capillary: 115 mg/dL — ABNORMAL HIGH (ref 70–99)
Glucose-Capillary: 162 mg/dL — ABNORMAL HIGH (ref 70–99)
Glucose-Capillary: 166 mg/dL — ABNORMAL HIGH (ref 70–99)
Glucose-Capillary: 177 mg/dL — ABNORMAL HIGH (ref 70–99)
Glucose-Capillary: 181 mg/dL — ABNORMAL HIGH (ref 70–99)
Glucose-Capillary: 186 mg/dL — ABNORMAL HIGH (ref 70–99)
Glucose-Capillary: 205 mg/dL — ABNORMAL HIGH (ref 70–99)

## 2018-03-15 MED ORDER — FREE WATER
200.0000 mL | Status: DC
  Administered 2018-03-15 – 2018-03-18 (×17): 200 mL

## 2018-03-15 MED ORDER — OSMOLITE 1.5 CAL PO LIQD
1000.0000 mL | ORAL | Status: DC
  Administered 2018-03-16 – 2018-03-17 (×4): 1000 mL
  Filled 2018-03-15 (×5): qty 1000

## 2018-03-15 MED ORDER — PRO-STAT SUGAR FREE PO LIQD
30.0000 mL | Freq: Two times a day (BID) | ORAL | Status: DC
  Administered 2018-03-15 – 2018-03-16 (×3): 30 mL via ORAL
  Filled 2018-03-15 (×3): qty 30

## 2018-03-15 NOTE — Progress Notes (Signed)
2 Days Post-Op   Subjective/Chief Complaint: He is doing well.    Objective: Vital signs in last 24 hours: Temp:  [98.1 F (36.7 C)-98.4 F (36.9 C)] 98.1 F (36.7 C) (03/13 0433) Pulse Rate:  [60-86] 77 (03/13 1159) Resp:  [14-18] 16 (03/13 1159) BP: (117-123)/(72-76) 123/72 (03/13 0433) SpO2:  [93 %-98 %] 93 % (03/13 1159) FiO2 (%):  [28 %] 28 % (03/13 1159) Last BM Date: 03/06/18  Intake/Output from previous day: 03/12 0701 - 03/13 0700 In: 3082 [I.V.:2682; NG/GT:400] Out: 3075 [Urine:3075] Intake/Output this shift: Total I/O In: -  Out: 900 [Urine:900]  alert. he is bathing today. his trach is open. he has no breathing trouble. lungs clear. no tenderness in ext  Lab Results:  Recent Labs    03/12/18 1833  WBC 9.8  HGB 14.8  HCT 45.2  PLT 265   BMET Recent Labs    03/12/18 1833  NA 136  K 3.1*  CL 96*  CO2 32  GLUCOSE 200*  BUN 17  CREATININE 1.18  CALCIUM 8.5*   PT/INR Recent Labs    03/12/18 1833  LABPROT 15.2  INR 1.2   ABG No results for input(s): PHART, HCO3 in the last 72 hours.  Invalid input(s): PCO2, PO2  Studies/Results: No results found.  Anti-infectives: Anti-infectives (From admission, onward)   Start     Dose/Rate Route Frequency Ordered Stop   03/13/18 0845  ciprofloxacin (CIPRO) IVPB 400 mg     400 mg 200 mL/hr over 60 Minutes Intravenous  Once 03/13/18 0838 03/13/18 0902      Assessment/Plan: s/p Procedure(s): ESOPHAGOGASTRODUODENOSCOPY (EGD) WITH PROPOFOL (N/A) PERCUTANEOUS ENDOSCOPIC GASTROSTOMY (PEG) PLACEMENT (N/A) he will need all the equipment arranged and had another trach training today. will probably discharge on Monday  LOS: 8 days    Melissa Montane 03/15/2018

## 2018-03-15 NOTE — Care Management Important Message (Signed)
Important Message  Patient Details  Name: Chad Olson MRN: [id number] Date of Birth: [birth year]   Medicare Important Message Given:  Yes    [name] 03/15/2018, 4:10 PM

## 2018-03-15 NOTE — Progress Notes (Signed)
Physical Therapy Treatment Patient Details Name: Chad Olson MRN: [id number] DOB: 1945-09-06 Today's Date: 03/15/2018    History of Present Illness Pt admitted for biopsy of mass on bottom of tongue. PMH dementia, bladder cancer, PE, GERD. Currently with tracheostomy placed, urostomy in RLQ of abdomen and Cortrak.    PT Comments    Patient continues to make progress toward PT goals. RN reports pt ambulated twice this morning prior to session and pt seems in good spirits. Pt is tolerating mobility well (even danced a little on return to room) and  overall supervision/min guard for OOB mobility using RW. Current plan remains appropriate.     Follow Up Recommendations  Home health PT     Equipment Recommendations  Rolling walker with 5" wheels    Recommendations for Other Services       Precautions / Restrictions Precautions Precautions: Fall Restrictions Weight Bearing Restrictions: No    Mobility  Bed Mobility Overal bed mobility: Modified Independent Bed Mobility: Supine to Sit           General bed mobility comments: use of rails  Transfers Overall transfer level: Needs assistance   Transfers: Sit to/from Stand Sit to Stand: Min guard         General transfer comment: min guard for safety; no physical assist needed  Ambulation/Gait Ambulation/Gait assistance: Supervision Gait Distance (Feet): 160 Feet Assistive device: Rolling walker (2 wheeled) Gait Pattern/deviations: Step-through pattern;Decreased stride length Gait velocity: decreased   General Gait Details: cues for posture; pt with increasing cadence from previous session   Stairs             Wheelchair Mobility    Modified Rankin (Stroke Patients Only)       Balance Overall balance assessment: Needs assistance Sitting-balance support: No upper extremity supported;Feet supported Sitting balance-Leahy Scale: Good     Standing balance support: Bilateral upper extremity  supported;During functional activity Standing balance-Leahy Scale: Fair Standing balance comment: pt is able to static stand without UE support but with increased flexed posture                            Cognition Arousal/Alertness: Awake/alert Behavior During Therapy: WFL for tasks assessed/performed Overall Cognitive Status: History of cognitive impairments - at baseline                                        Exercises      General Comments General comments (skin integrity, edema, etc.): wife and sister present in room      Pertinent Vitals/Pain Pain Assessment: No/denies pain    Home Living                      Prior Function            PT Goals (current goals can now be found in the care plan section) Progress towards PT goals: Progressing toward goals    Frequency    Min 3X/week      PT Plan Current plan remains appropriate    Co-evaluation              AM-PAC PT "6 Clicks" Mobility   Outcome Measure  Help needed turning from your back to your side while in a flat bed without using bedrails?: None Help needed moving from lying on your back to  sitting on the side of a flat bed without using bedrails?: A Little Help needed moving to and from a bed to a chair (including a wheelchair)?: A Little Help needed standing up from a chair using your arms (e.g., wheelchair or bedside chair)?: None Help needed to walk in hospital room?: A Little Help needed climbing 3-5 steps with a railing? : A Little 6 Click Score: 20    End of Session Equipment Utilized During Treatment: Gait belt Activity Tolerance: Patient tolerated treatment well Patient left: with call bell/phone within reach;with family/visitor present;in chair Nurse Communication: Mobility status PT Visit Diagnosis: Unsteadiness on feet (R26.81);Other abnormalities of gait and mobility (R26.89);Muscle weakness (generalized) (M62.81)     Time: 1962-2297 PT Time  Calculation (min) (ACUTE ONLY): 28 min  Charges:  $Gait Training: 23-37 mins                     Earney Navy, PTA Acute Rehabilitation Services Pager: [phone] Office: [phone]     Darliss Cheney 03/15/2018, 11:51 AM

## 2018-03-15 NOTE — Progress Notes (Signed)
Nutrition Follow-up  DOCUMENTATION CODES:   Non-severe (moderate) malnutrition in context of chronic illness  INTERVENTION:   Continue Osmolite 1.5@ 41m/hr via PEG  Continue 366mProstat TID.  Continue 200 ml free water flush every 4 hours  Tube feeding regimen provides2460kcal (100% of needs),135grams of protein, and 229760mf H2O.   NUTRITION DIAGNOSIS:   Moderate Malnutrition related to chronic illness as evidenced by mild muscle depletion, percent weight loss(20% weight loss within a year).  Ongoing  GOAL:   Patient will meet greater than or equal to 90% of their needs  Met with TF  MONITOR:   Labs, Weight trends, Skin, I & O's, TF tolerance  REASON FOR ASSESSMENT:   Consult Enteral/tube feeding initiation and management  ASSESSMENT:   [age] male with PMH of HTN, HLD, bladder carcinoma, PVD, CKD who was admitted with large mass at base of tongue. S/P tracheostomy on admission.  3/6- cortrak tube placed (gastric) 3/7 transferred from ICU to floor; TF initiated 3/11- s/p PEG placement, cortrak removed  Reviewed I/O's: +7 ml x 24 hours and +5.6 L since admission  Spoke with pt wife, who reports that they have elected for bolus feedings at discharge.   Case discussed with RN, who reports pt is tolerating TF well. Family has receiving continued training on bolus feedings and trach care. RN has requested that continuous feedings continue while pt is in the hospital.   Per RNCM, bolus feeding recommendations have been sent to [facility] discharge preparation.   Labs reviewed: CBGS: 115-205.   Diet Order:   Diet Order            Diet NPO time specified  Diet effective now              EDUCATION NEEDS:   No education needs have been identified at this time  Skin:  Skin Assessment: Skin Integrity Issues: Skin Integrity Issues:: Incisions Incisions: closed neck, abdomen  Last BM:  03/06/18  Height:   Ht Readings from Last 1  Encounters:  03/07/18 '5\' 11"'$  (1.803 m)    Weight:   Wt Readings from Last 1 Encounters:  03/13/18 94.4 kg    Ideal Body Weight:  78.2 kg  BMI:  Body mass index is 29.03 kg/m.  Estimated Nutritional Needs:   Kcal:  2200-2500  Protein:  125-150 gm  Fluid:  2.2 L    Fatoumata Albaugh A. WilJimmye NormanD, LDN, CDCUnion Depositgistered Dietitian II Certified Diabetes Care and Education Specialist Pager: [phone] hours Pager: [phone]

## 2018-03-15 NOTE — Progress Notes (Signed)
Occupational Therapy Treatment Patient Details Name: Chad Olson MRN: [id number] DOB: 1945/04/25 Today's Date: 03/15/2018    History of present illness Pt admitted for biopsy of mass on bottom of tongue. PMH dementia, bladder cancer, PE, GERD. Currently with tracheostomy placed, urostomy in RLQ of abdomen and Cortrak.   OT comments  Session limited due to pt's fatigue. Pt in bed upon arrival with wife present. Pt's wife reports that pt has walked x 3 today including with PT and sat up in recliner for a significant amount of time today. pPt declined OOB activity this session due to being  fatigued from PT visit earlier and being up in recliner. Pt agreeable to MMT, ROM and rolling in bed. Discussed d/c pland and DME and A/E set up for pt at home. Pt wife reports having a ramp, RW, reacher and pt will have 24/7 sup and assist. OT will continue to follow acutely  Follow Up Recommendations  Home health OT;Supervision/Assistance - 24 hour    Equipment Recommendations  3 in 1 bedside commode    Recommendations for Other Services      Precautions / Restrictions Precautions Precautions: Fall Restrictions Weight Bearing Restrictions: No       Mobility Bed Mobility Overal bed mobility: Modified Independent Bed Mobility: Rolling Rolling: Modified independent (Device/Increase time)   Supine to sit: Modified independent (Device/Increase time)     General bed mobility comments: use of rails  Transfers Overall transfer level: Needs assistance   Transfers: Sit to/from Stand Sit to Stand: Min guard         General transfer comment: pt fatigued from walking with PT earlier and being up in recliner    Balance Overall balance assessment: Needs assistance Sitting-balance support: No upper extremity supported;Feet supported Sitting balance-Leahy Scale: Good     Standing balance support: Bilateral upper extremity supported;During functional activity Standing balance-Leahy Scale:  Fair Standing balance comment: pt is able to static stand without UE support but with increased flexed posture                           ADL either performed or assessed with clinical judgement   ADL Overall ADL's : Needs assistance/impaired Eating/Feeding: NPO                                     General ADL Comments: pt in bed upon arrival with wife present. Pt fatigued from PT visit earlier and being up in recliner. Discussed D/c pland and DME and A/E set up for pt at home. Pt wife reports having a ramp, RW, reacher and pt will have 24/7 sup and assist.     Vision Patient Visual Report: No change from baseline     Perception     Praxis      Cognition Arousal/Alertness: Awake/alert Behavior During Therapy: WFL for tasks assessed/performed Overall Cognitive Status: History of cognitive impairments - at baseline                                 General Comments: Pt responds well to commands and communicates by nodding yes/no. Baseline dementia per wife        Exercises     Shoulder Instructions       General Comments wife and sister present in room    Pertinent Vitals/  Pain       Pain Assessment: No/denies pain Pain Score: 0-No pain Pain Intervention(s): Monitored during session  Home Living                                          Prior Functioning/Environment              Frequency  Min 2X/week        Progress Toward Goals  OT Goals(current goals can now be found in the care plan section)  Progress towards OT goals: OT to reassess next treatment  Acute Rehab OT Goals Patient Stated Goal: pt unable to state goal- per wife: go home  Plan Discharge plan remains appropriate    Co-evaluation                 AM-PAC OT "6 Clicks" Daily Activity     Outcome Measure   Help from another person eating meals?: Total   Help from another person toileting, which includes using toliet,  bedpan, or urinal?: A Little Help from another person bathing (including washing, rinsing, drying)?: A Lot Help from another person to put on and taking off regular upper body clothing?: A Little Help from another person to put on and taking off regular lower body clothing?: A Lot 6 Click Score: 11    End of Session Equipment Utilized During Treatment: Oxygen  OT Visit Diagnosis: Other (comment)   Activity Tolerance Patient limited by fatigue   Patient Left in bed;with call bell/phone within reach;with family/visitor present   Nurse Communication          Time: 2248-2500 OT Time Calculation (min): 20 min  Charges: OT General Charges $OT Visit: 1 Visit OT Treatments $Therapeutic Activity: 8-22 mins     Britt Bottom 03/15/2018, 1:55 PM

## 2018-03-16 LAB — GLUCOSE, CAPILLARY
Glucose-Capillary: 183 mg/dL — ABNORMAL HIGH (ref 70–99)
Glucose-Capillary: 172 mg/dL — ABNORMAL HIGH (ref 70–99)
Glucose-Capillary: 132 mg/dL — ABNORMAL HIGH (ref 70–99)
Glucose-Capillary: 152 mg/dL — ABNORMAL HIGH (ref 70–99)
Glucose-Capillary: 156 mg/dL — ABNORMAL HIGH (ref 70–99)

## 2018-03-16 MED ORDER — POLYETHYLENE GLYCOL 3350 17 G PO PACK
17.0000 g | PACK | Freq: Every day | ORAL | Status: DC
  Administered 2018-03-16 – 2018-03-18 (×3): 17 g
  Filled 2018-03-16 (×3): qty 1

## 2018-03-16 MED ORDER — BISACODYL 10 MG RE SUPP: 10.0000 mg | Freq: Every day | RECTAL | Status: DC | PRN

## 2018-03-16 NOTE — Progress Notes (Signed)
   Subjective:    Patient ID: Chad Olson, male    DOB: 16-Apr-1945, 73 y.o.   MRN: [id number]  HPI He has no complaints.  His wife notes his trach secretions and some wound bleeding inferior to trach tube.  Review of Systems     Objective:   Physical Exam AF VSS Alert, NAD Cuffless trach in place, normal secretions, minor wound breakdown inferior to trach with some padding     Assessment & Plan:  Tongue base cancer s/p tracheostomy, palliative care  Continue supportive care.  Nurse requested help for constipation.  Will order Passy-Muir valve.  Home health arrangements being made.  Aim for discharge home Monday.

## 2018-03-17 LAB — GLUCOSE, CAPILLARY
GLUCOSE-CAPILLARY: 136 mg/dL — AB (ref 70–99)
Glucose-Capillary: 170 mg/dL — ABNORMAL HIGH (ref 70–99)
Glucose-Capillary: 181 mg/dL — ABNORMAL HIGH (ref 70–99)
Glucose-Capillary: 178 mg/dL — ABNORMAL HIGH (ref 70–99)
Glucose-Capillary: 190 mg/dL — ABNORMAL HIGH (ref 70–99)
Glucose-Capillary: 171 mg/dL — ABNORMAL HIGH (ref 70–99)

## 2018-03-17 MED ORDER — PRO-STAT SUGAR FREE PO LIQD
30.0000 mL | Freq: Two times a day (BID) | ORAL | Status: DC
  Administered 2018-03-17 – 2018-03-18 (×2): 30 mL
  Filled 2018-03-17 (×2): qty 30

## 2018-03-17 MED ORDER — INFLUENZA VAC SPLIT HIGH-DOSE 0.5 ML IM SUSY: 0.5000 mL | PREFILLED_SYRINGE | INTRAMUSCULAR | Status: DC | PRN

## 2018-03-17 MED ORDER — POTASSIUM CHLORIDE 20 MEQ/15ML (10%) PO SOLN
10.0000 meq | Freq: Every day | ORAL | Status: DC
  Administered 2018-03-17 – 2018-03-18 (×2): 10 meq
  Filled 2018-03-17 (×2): qty 15

## 2018-03-17 MED ORDER — PANTOPRAZOLE SODIUM 40 MG PO PACK
40.0000 mg | PACK | Freq: Every day | ORAL | Status: DC
  Administered 2018-03-17 – 2018-03-18 (×2): 40 mg
  Filled 2018-03-17 (×2): qty 20

## 2018-03-17 NOTE — Progress Notes (Signed)
OT Cancellation Note  Patient Details Name: Chad Olson MRN: [id number] DOB: [birth year]   Cancelled Treatment:    Reason Eval/Treat Not Completed: Other (comment). Pt. And pts. Sister who was present declined acute OT stated goals this day. Both report they "feel comfortable with all of that stuff". Pt. Only wanting to ambulate around the nursing unit today.  Nursing staff notified of pts. Request. Did encourage oob to chair or eob sitting.  Note likely d/c home tomorrow.   Janice Coffin, COTA/L 03/17/2018, 9:47 AM

## 2018-03-17 NOTE — Progress Notes (Signed)
   Subjective:    Patient ID: Chad Olson, male    DOB: 05-20-1945, 73 y.o.   MRN: [id number]  HPI Doing well.  No complaints.  Sister at bedside, still noting trach secretions.   Review of Systems     Objective:   Physical Exam AF VSS Alert, NAD Trach in place, normal secretions    Assessment & Plan:  Tongue base cancer  Stable.  Likely discharge home tomorrow.
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
## 2018-03-17 NOTE — TOC Transition Note (Addendum)
Transition of Care [facility]) - CM/SW Discharge Note   Patient Details  Name: Chad Olson MRN: [id number] Date of Birth: 04/21/1945  Transition of Care [facility]) CM/SW Contact:  Claudie Leach, RN Phone Number: 03/17/2018, 11:50 AM     Barriers to Discharge: Other (comment)(PEG placement, Trach teaching Thursday 03/14/18 )   Patient Goals and CMS Choice Patient states their goals for this hospitalization and ongoing recovery are:: to go hom e CMS Medicare.gov Compare Post Acute Care list provided to:: Patient(Spouse ) Choice offered to / list presented to : Patient, Spouse                      Discharge Plan and Services Discharge Planning Services: CM Consult Post Acute Care Choice: Home Health, Durable Medical Equipment          DME Arranged: Walker rolling, Tube feeding, Trach supplies, Suction, Tube feeding pump DME Agency: [facility] HH Arranged: RN, PT, OT, Respirator Therapy, Speech Therapy HH Agency: [place] ([facility])  Speech therapy added to [facility] order per SLP recommendation.  D/W Corene Cornea from [facility].  Plan for d/c Monday 3/16.

## 2018-03-18 LAB — GLUCOSE, CAPILLARY
GLUCOSE-CAPILLARY: 140 mg/dL — AB (ref 70–99)
GLUCOSE-CAPILLARY: 160 mg/dL — AB (ref 70–99)
Glucose-Capillary: 161 mg/dL — ABNORMAL HIGH (ref 70–99)

## 2018-03-18 NOTE — Discharge Planning (Signed)
Patient discharged home in stable condition. Wife verbalizes understanding of all discharge instructions, including home medications, trach and PEG care and follow up appointments.

## 2018-03-18 NOTE — Discharge Summary (Signed)
Physician Discharge Summary  Patient ID: Chad Olson MRN: [id number] DOB/AGE: 1945-04-10 73 y.o.  Admit date: 03/07/2018 Discharge date: 03/18/2018  Admission Diagnoses: Squamous cell carcinoma the base of tongue  Discharge Diagnoses: Same Active Problems:   Malignant tumor of base of tongue (HCC)   Malnutrition of moderate degree   Goals of care, counseling/discussion   Palliative care by specialist   Discharged Condition: good  Hospital Course: Patient was admitted for tracheotomy and direct laryngoscopy for a large mass in the base of tongue.  He has been here mostly arranging his home care after his 4 to 5 days of observation for fresh tracheotomy.  The trach is doing well.  He has a good voice.  He now has had a PEG tube as well which he will gain his nutrition.  He has had all the supplies set up at home through case management for trach care and tube feedings.  He is back to his baseline and ready for discharge.  They have seen radiation oncology and the family and patient have elected not to proceed with any treatment for this tumor.  Consults: None  Significant Diagnostic Studies: None  Treatments: surgery: Tracheotomy and direct laryngoscopy  Discharge Exam: Blood pressure 120/82, pulse 75, temperature 98.1 F (36.7 C), temperature source Oral, resp. rate 18, height 5\' 11"  (1.803 m), weight 91.5 kg, SpO2 98 %. Awake and alert.  Lurline Idol is in good position and open.  Lungs are clear.  Abdomen soft.  Extremities no tenderness or swelling  Disposition:   Patient was discharged to home.  Case management has made sure he has all his supplies.  He is to follow-up in the office in 1 to 2 weeks.  Follow-up Information    [facility] Follow up.        [facility] Follow up.        [facility] Follow up.   Specialty:  PALLIATIVE CARE Contact information: [place] [phone]          Signed: Melissa Montane 03/18/2018, 8:07 AM

## 2018-03-18 NOTE — Progress Notes (Signed)
Daily Progress Note   Patient Name: Chad Olson       Date: 03/18/2018 DOB: December 14, 1945  Age: 73 y.o. MRN#: [id number] Attending Physician: Melissa Montane, MD Primary Care Physician: Chesley Noon, MD Admit Date: 03/07/2018  Reason for Consultation/Follow-up: Establishing goals of care  Subjective: RN and wife at bedside - wife successfully administering medications via PEG tube. She tells me they are ready for discharge - excited to go home. Patient in good spirits with no complaints.   Length of Stay: 11  Current Medications: Scheduled Meds:  . atorvastatin  40 mg Per Tube Daily  . chlorhexidine  15 mL Mouth Rinse BID  . donepezil  5 mg Per Tube QHS  . feeding supplement (PRO-STAT SUGAR FREE 64)  30 mL Per Tube BID  . free water  200 mL Per Tube Q4H  . hydrochlorothiazide  25 mg Per Tube Daily  . mouth rinse  15 mL Mouth Rinse q12n4p  . mupirocin cream   Topical BID  . neomycin-bacitracin-polymyxin  1 application Topical Daily  . pantoprazole sodium  40 mg Per Tube QAC breakfast  . polyethylene glycol  17 g Per Tube Daily  . potassium chloride  10 mEq Per Tube Daily    Continuous Infusions: . dextrose 5 % and 0.45% NaCl 125 mL/hr at 03/17/18 1714  . feeding supplement (OSMOLITE 1.5 CAL) 1,000 mL (03/17/18 2302)    PRN Meds: bisacodyl, Influenza vac split quadrivalent PF, morphine injection, ondansetron **OR** ondansetron (ZOFRAN) IV  Physical Exam Constitutional:      General: He is not in acute distress. Cardiovascular:     Rate and Rhythm: Normal rate and regular rhythm.  Pulmonary:     Effort: Pulmonary effort is normal.     Breath sounds: Normal breath sounds.  Abdominal:     Palpations: Abdomen is soft.     Comments: PEG in place  Musculoskeletal:     Right lower leg:  No edema.     Left lower leg: No edema.  Skin:    General: Skin is warm and dry.  Neurological:     Mental Status: He is alert.     Comments: Orientation difficult to assess             Vital Signs: BP 120/82 (BP Location: Right Arm)   Pulse 80   Temp 98.1 F (36.7 C) (Oral)   Resp 18   Ht 5\' 11"  (1.803 m)   Wt 91.5 kg   SpO2 98%   BMI 28.13 kg/m  SpO2: SpO2: 98 % O2 Device: O2 Device: Tracheostomy Collar O2 Flow Rate: O2 Flow Rate (L/min): 5 L/min  Intake/output summary:   Intake/Output Summary (Last 24 hours) at 03/18/2018 1108 Last data filed at 03/18/2018 0500 Gross per 24 hour  Intake 10439.91 ml  Output 1800 ml  Net 8639.91 ml   LBM: Last BM Date: 03/06/18 Baseline Weight: Weight: 89.8 kg Most recent weight: Weight: 91.5 kg       Palliative Assessment/Data: PPS 50%    Flowsheet Rows     Most Recent Value  Intake Tab  Referral Department  Oncology  Unit at Time of Referral  Med/Surg Unit  Palliative Care  Primary Diagnosis  Cancer  Date Notified  03/13/18  Palliative Care Type  New Palliative care  Reason for referral  Clarify Goals of Care  Date of Admission  03/07/18  Date first seen by Palliative Care  03/14/18  # of days Palliative referral response time  1 Day(s)  # of days IP prior to Palliative referral  6  Clinical Assessment  Palliative Performance Scale Score  50%  Psychosocial & Spiritual Assessment  Palliative Care Outcomes  Patient/Family meeting held?  Yes  Who was at the meeting?  wife and sister  Palliative Care Outcomes  Clarified goals of care, Counseled regarding hospice, Provided psychosocial or spiritual support, Changed CPR status, Completed durable DNR, Linked to palliative care logitudinal support      Patient Active Problem List   Diagnosis Date Noted  . Goals of care, counseling/discussion   . Palliative care by specialist   . Malnutrition of moderate degree 03/11/2018  . Malignant tumor of base of tongue ([place])  03/07/2018  . Ureteral stone with hydronephrosis 01/06/2015  . Sepsis ([place]) 12/17/2014  . Acute unilateral obstructive uropathy   . Ureteral calculus, right 12/15/2014  . Presence of urostomy ([place]) 12/15/2014  . Right ureteral stone 12/15/2014  . Hydronephrosis, right   . Incisional hernia, without obstruction or gangrene 11/25/2012  . Diarrhea 07/25/2012  . Anorexia 07/23/2012  . Hypokalemia 07/15/2012  . Colonic obstruction ([place]) 07/12/2012  . Gastroparesis 07/02/2012  . Pelvic hematoma, male 06/26/2012  . Acute blood loss anemia 06/25/2012  . Protein-calorie malnutrition, severe ([place]) 06/24/2012  . VTE (venous thromboembolism) 05/16/2012  . Bacteremia MRSA 5/14 05/14/2012  . Nephrostomy status ([place]) 05/13/2012  . UTI (lower urinary tract infection) 05/13/2012  . Bilateral hydronephrosis 05/13/2012  . DVT (deep venous thrombosis) 3/14 05/13/2012  . Fever 05/13/2012  . Bilateral renal cysts 03/25/2012  . HTN (hypertension) 03/25/2012  . Acute pulmonary embolism (3/14) 03/25/2012  . Nausea alone 03/25/2012  . Bladder cancer s/p robotic prostatecystectomy [id number] 08/21/2011  . GERD 10/24/2007  . OTHER DYSPHAGIA 09/19/2007    Palliative Care Assessment & Plan   HPI: 74 y.o. male  with past medical history of bladder cancer, CKD, HTN, HLD, and dementia admitted on 03/07/2018 with large mass in base of tongue for biopsy and tracheostomy.  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 Patient had several months of dysphagia prior to diagnosis. Tracheostomy placed 3/5 and PEG  PMT consulted by radiation oncology for [place].  Assessment: Follow up with patient and family. Called to bedside by wife. She shares they are discharging today. We discussed discharge plan again - to start with home health and palliative and then transition to hospice. We discussed ype of support provided by palliative care. We discussed transition to hospice. All questions and concerns addressed.  Emotional support provided.   Recommendations/Plan:  Discharge today with home health and palliative care - transition to hospice  DNR - wife provided with signed DNR and MOST  Family has decided against chemo/radiation  Goals of Care and Additional Recommendations:  Limitations on Scope of Treatment: Avoid Hospitalization, No Chemotherapy and No Radiation  Code Status:  DNR  Prognosis:   Unable to determine  Discharge Planning:  Home with [place] was discussed with Patient, wife, nurse  Thank you for allowing the Palliative Medicine Team to assist in the care of this patient.   Total Time 25 minutes Prolonged Time Billed  no  Greater than 50%  of this time was spent counseling and coordinating care related to the above assessment and plan.  Juel Burrow, DNP, [facility] Palliative Medicine Team Team Phone # [phone]  Pager [phone]

## 2018-03-18 NOTE — TOC Progression Note (Signed)
Transition of Care [facility]) - Progression Note    Patient Details  Name: Chad Olson MRN: [id number] Date of Birth: 11-21-1945  [facility]) CM/SW Contact  Jacalyn Lefevre Edson Snowball, RN Phone Number: 03/18/2018, 10:25 AM  Clinical Narrative:      PAtient for discharge today. Spoke to wife and patient at discharge.  Jermaine with [place] aware discharge is today. Respiratory therapy from [facility] will met patient and wife at home with additional supplies. Portable suction was delivered to bedside last week.   Dan with [place] aware discharge is today.   Bedside nurse will send patient home with additional trach and ambu bag.   Wife has been taught trach care and PEG/   tube feeding care. Expected Discharge Plan: [place] Barriers to Discharge: No Barriers Identified  Expected Discharge Plan and Services Expected Discharge Plan: [place] Discharge Planning Services: CM Consult Post Acute Care Choice: Home Health, Durable Medical Equipment Living arrangements for the past 2 months: Single Family Home Expected Discharge Date: 03/18/18               DME Arranged: Gilford Rile rolling, Tube feeding, Trach supplies, Suction, Tube feeding pump DME Agency: [facility] HH Arranged: RN, PT, OT, Respirator Therapy, Speech Therapy HH Agency: [place] ([facility])   Social Determinants of Health (SDOH) Interventions    Readmission Risk Interventions 30 Day Unplanned Readmission Risk Score     Admission (Current) from 03/07/2018 in [place]  30 Day Unplanned Readmission Risk Score (%)  13 Filed at 03/18/2018 0801     This score is the patient's risk of an unplanned readmission within 30 days of being discharged (0 -100%). The score is based on dignosis, age, lab data, medications, orders, and past utilization.   Low:  0-14.9   Medium: 15-21.9   High: 22-29.9   Extreme: 30 and  above       No flowsheet data found.

## 2018-03-18 NOTE — Progress Notes (Signed)
  Speech Language Pathology Treatment: Nada Boozer Speaking valve  Patient Details Name: Chad Olson MRN: [id number] DOB: May 30, 1945 Today's Date: 03/18/2018 Time: 1040-1100 SLP Time Calculation (min) (ACUTE ONLY): 20 min  Assessment / Plan / Recommendation Clinical Impression  Pt and wife were seen today prior to DC home for education regarding use and care of PMSV. SLP provided education regarding donning and doffing of valve, both of which pt's wife was able to demonstrate. SLP educated pt/wife regarding how to clean valve, when it needs to be removed (during sleep and breathing treatments), and use of tether to minimize risk of loss. Fortunately, pt's valve is cuffless. Wife reports home health speech therapy is scheduled to come this afternoon, and RN confirmed this. Wife was given acute ST phone number in case of issues or questions prior to arrival of [place]. Pt and his wife verbalized understanding of education provided, and did not have additional questions at this time.    HPI HPI: 73 y.o. male  with past medical history of bladder cancer, CKD, HTN, HLD, and dementia admitted on 03/07/2018 with large mass in base of tongue for biopsy and tracheostomy.  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 PMH dementia, bladder cancer, PE, GERD. Currently with tracheostomy placed 03/07/18, urostomy in RLQ of abdomen and PEG placement 03/15/18.      SLP Plan  Other (Comment)(Pt being DC'd today. Home health ST appointment this afternoon)       Recommendations   home health speech therapy.      Patient may use Passy-Muir Speech Valve: During all waking hours (remove during sleep)(remove during breathing treatments) PMSV Supervision: Full         Follow up Recommendations: Home health SLP SLP Visit Diagnosis: Aphonia (R49.1) Plan: Other (Comment)(Pt being DC'd today. Home health ST appointment this afternoon)       [place] Qu[place], CCC-SLP Speech  Language Pathologist [phone]  Shonna Chock 03/18/2018, 11:04 AM

## 2018-03-25 ENCOUNTER — Encounter: Payer: Self-pay | Admitting: Internal Medicine

## 2018-03-25 ENCOUNTER — Other Ambulatory Visit: Payer: Self-pay

## 2018-03-25 ENCOUNTER — Other Ambulatory Visit: Payer: Medicare HMO | Admitting: Internal Medicine

## 2018-03-25 DIAGNOSIS — Z515 Encounter for palliative care: Secondary | ICD-10-CM
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
## 2018-03-26 ENCOUNTER — Other Ambulatory Visit: Payer: Self-pay

## 2018-03-26 ENCOUNTER — Emergency Department (HOSPITAL_COMMUNITY)
Admission: EM | Admit: 2018-03-26 | Discharge: 2018-03-26 | Disposition: A | Discharge status: Home / Self Care | Attending: Emergency Medicine | Admitting: Emergency Medicine

## 2018-03-26 ENCOUNTER — Encounter (HOSPITAL_COMMUNITY): Payer: Self-pay

## 2018-03-26 DIAGNOSIS — I10 Essential (primary) hypertension: Secondary | ICD-10-CM | POA: Diagnosis not present

## 2018-03-26 DIAGNOSIS — C01 Malignant neoplasm of base of tongue: Secondary | ICD-10-CM | POA: Insufficient documentation

## 2018-03-26 DIAGNOSIS — K9429 Other complications of gastrostomy: Secondary | ICD-10-CM | POA: Insufficient documentation

## 2018-03-26 DIAGNOSIS — Z86718 Personal history of other venous thrombosis and embolism: Secondary | ICD-10-CM | POA: Diagnosis not present

## 2018-03-26 DIAGNOSIS — F0390 Unspecified dementia without behavioral disturbance: Secondary | ICD-10-CM | POA: Insufficient documentation

## 2018-03-26 DIAGNOSIS — E785 Hyperlipidemia, unspecified: Secondary | ICD-10-CM | POA: Diagnosis not present

## 2018-03-26 DIAGNOSIS — Z8551 Personal history of malignant neoplasm of bladder: Secondary | ICD-10-CM | POA: Insufficient documentation

## 2018-03-26 DIAGNOSIS — T85598A Other mechanical complication of other gastrointestinal prosthetic devices, implants and grafts, initial encounter: Secondary | ICD-10-CM

## 2018-03-26 NOTE — ED Notes (Signed)
Patient's wife Chad Olson requests info when available. [phone].

## 2018-03-26 NOTE — Discharge Instructions (Addendum)
You were evaluated in the emergency department for a possible blocked feeding tube.  It appeared to be functioning well here and flushed easily.  Please call the GI doctors if you have any trouble with this and return to the emergency department if any concerns.

## 2018-03-26 NOTE — ED Triage Notes (Signed)
Pt arrives from home with c/o clogged feeding tube since yesterday. Denies complaints. Pt has hx of neck CA and has trach, urostomy and g tube.
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
## 2018-07-03 DEATH — deceased
